# Patient Record
Sex: Male | Born: 1950
Health system: Southern US, Community
[De-identification: ages and names within clinical notes are randomized; demographics above are authoritative.]

## PROBLEM LIST (undated history)

## (undated) DIAGNOSIS — I350 Nonrheumatic aortic (valve) stenosis: Secondary | ICD-10-CM

## (undated) DIAGNOSIS — G8929 Other chronic pain: Secondary | ICD-10-CM

## (undated) DIAGNOSIS — M549 Dorsalgia, unspecified: Secondary | ICD-10-CM

## (undated) DIAGNOSIS — G9009 Other idiopathic peripheral autonomic neuropathy: Secondary | ICD-10-CM

## (undated) DIAGNOSIS — D689 Coagulation defect, unspecified: Secondary | ICD-10-CM

## (undated) DIAGNOSIS — I480 Paroxysmal atrial fibrillation: Secondary | ICD-10-CM

## (undated) DIAGNOSIS — Z8639 Personal history of other endocrine, nutritional and metabolic disease: Secondary | ICD-10-CM

## (undated) DIAGNOSIS — S0291XA Unspecified fracture of skull, initial encounter for closed fracture: Secondary | ICD-10-CM

## (undated) DIAGNOSIS — I639 Cerebral infarction, unspecified: Secondary | ICD-10-CM

## (undated) DIAGNOSIS — N200 Calculus of kidney: Secondary | ICD-10-CM

## (undated) DIAGNOSIS — Z8673 Personal history of transient ischemic attack (TIA), and cerebral infarction without residual deficits: Secondary | ICD-10-CM

## (undated) DIAGNOSIS — M1990 Unspecified osteoarthritis, unspecified site: Secondary | ICD-10-CM

## (undated) DIAGNOSIS — K219 Gastro-esophageal reflux disease without esophagitis: Secondary | ICD-10-CM

## (undated) DIAGNOSIS — F4310 Post-traumatic stress disorder, unspecified: Secondary | ICD-10-CM

## (undated) DIAGNOSIS — D649 Anemia, unspecified: Secondary | ICD-10-CM

## (undated) DIAGNOSIS — F32A Depression, unspecified: Secondary | ICD-10-CM

## (undated) DIAGNOSIS — R569 Unspecified convulsions: Secondary | ICD-10-CM

## (undated) DIAGNOSIS — Z9889 Other specified postprocedural states: Secondary | ICD-10-CM

## (undated) DIAGNOSIS — Z86718 Personal history of other venous thrombosis and embolism: Secondary | ICD-10-CM

## (undated) DIAGNOSIS — E785 Hyperlipidemia, unspecified: Secondary | ICD-10-CM

## (undated) DIAGNOSIS — F0390 Unspecified dementia without behavioral disturbance: Secondary | ICD-10-CM

## (undated) DIAGNOSIS — I10 Essential (primary) hypertension: Secondary | ICD-10-CM

## (undated) DIAGNOSIS — K56609 Unspecified intestinal obstruction, unspecified as to partial versus complete obstruction: Secondary | ICD-10-CM

## (undated) DIAGNOSIS — I6529 Occlusion and stenosis of unspecified carotid artery: Secondary | ICD-10-CM

## (undated) DIAGNOSIS — J449 Chronic obstructive pulmonary disease, unspecified: Secondary | ICD-10-CM

## (undated) DIAGNOSIS — R55 Syncope and collapse: Secondary | ICD-10-CM

## (undated) DIAGNOSIS — K635 Polyp of colon: Secondary | ICD-10-CM

## (undated) DIAGNOSIS — R600 Localized edema: Secondary | ICD-10-CM

## (undated) DIAGNOSIS — Z8249 Family history of ischemic heart disease and other diseases of the circulatory system: Secondary | ICD-10-CM

## (undated) DIAGNOSIS — F329 Major depressive disorder, single episode, unspecified: Secondary | ICD-10-CM

## (undated) DIAGNOSIS — R079 Chest pain, unspecified: Secondary | ICD-10-CM

## (undated) DIAGNOSIS — R112 Nausea with vomiting, unspecified: Secondary | ICD-10-CM

## (undated) HISTORY — DX: Chronic obstructive pulmonary disease, unspecified: J44.9

## (undated) HISTORY — DX: Cerebral infarction, unspecified: I63.9

## (undated) HISTORY — DX: Gastro-esophageal reflux disease without esophagitis: K21.9

## (undated) HISTORY — DX: Depression, unspecified: F32.A

## (undated) HISTORY — DX: Major depressive disorder, single episode, unspecified: F32.9

## (undated) HISTORY — DX: Unspecified convulsions: R56.9

## (undated) HISTORY — DX: Essential (primary) hypertension: I10

## (undated) HISTORY — PX: WRIST FRACTURE SURGERY: SHX121

## (undated) HISTORY — DX: Unspecified intestinal obstruction, unspecified as to partial versus complete obstruction: K56.609

## (undated) HISTORY — DX: Other idiopathic peripheral autonomic neuropathy: G90.09

## (undated) HISTORY — DX: Coagulation defect, unspecified: D68.9

## (undated) HISTORY — DX: Hyperlipidemia, unspecified: E78.5

## (undated) HISTORY — DX: Chest pain, unspecified: R07.9

## (undated) HISTORY — DX: Anemia, unspecified: D64.9

## (undated) HISTORY — DX: Personal history of other venous thrombosis and embolism: Z86.718

## (undated) HISTORY — DX: Personal history of transient ischemic attack (TIA), and cerebral infarction without residual deficits: Z86.73

## (undated) HISTORY — DX: Localized edema: R60.0

## (undated) HISTORY — DX: Nonrheumatic aortic (valve) stenosis: I35.0

## (undated) HISTORY — PX: JOINT REPLACEMENT: SHX530

## (undated) HISTORY — DX: Occlusion and stenosis of unspecified carotid artery: I65.29

## (undated) HISTORY — DX: Syncope and collapse: R55

## (undated) HISTORY — DX: Unspecified osteoarthritis, unspecified site: M19.90

## (undated) HISTORY — DX: Polyp of colon: K63.5

## (undated) HISTORY — PX: COLONOSCOPY: SHX174

## (undated) HISTORY — DX: Unspecified dementia, unspecified severity, without behavioral disturbance, psychotic disturbance, mood disturbance, and anxiety: F03.90

## (undated) HISTORY — DX: Personal history of other endocrine, nutritional and metabolic disease: Z86.39

## (undated) HISTORY — DX: Post-traumatic stress disorder, unspecified: F43.10

---
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
## 1997-11-12 ENCOUNTER — Inpatient Hospital Stay (HOSPITAL_COMMUNITY): Admission: AD | Admit: 1997-11-12 | Discharge: 1997-11-22 | Payer: Self-pay | Admitting: Surgery

## 1997-11-12 ENCOUNTER — Emergency Department (HOSPITAL_COMMUNITY): Admission: EM | Admit: 1997-11-12 | Discharge: 1997-11-12 | Payer: Self-pay | Admitting: Emergency Medicine

## 1997-12-13 ENCOUNTER — Ambulatory Visit (HOSPITAL_COMMUNITY): Admission: RE | Admit: 1997-12-13 | Discharge: 1997-12-13 | Payer: Self-pay | Admitting: General Surgery

## 1997-12-27 ENCOUNTER — Ambulatory Visit (HOSPITAL_COMMUNITY): Admission: RE | Admit: 1997-12-27 | Discharge: 1997-12-27 | Payer: Self-pay | Admitting: General Surgery

## 1998-11-21 ENCOUNTER — Emergency Department (HOSPITAL_COMMUNITY): Admission: EM | Admit: 1998-11-21 | Discharge: 1998-11-21 | Payer: Self-pay | Admitting: Otolaryngology

## 1998-12-27 ENCOUNTER — Other Ambulatory Visit: Admission: RE | Admit: 1998-12-27 | Discharge: 1998-12-27 | Payer: Self-pay | Admitting: Orthopedic Surgery

## 1999-07-17 ENCOUNTER — Ambulatory Visit (HOSPITAL_COMMUNITY): Admission: RE | Admit: 1999-07-17 | Discharge: 1999-07-17 | Payer: Self-pay | Admitting: *Deleted

## 1999-10-09 ENCOUNTER — Encounter: Payer: Self-pay | Admitting: Surgery

## 1999-10-09 ENCOUNTER — Ambulatory Visit (HOSPITAL_COMMUNITY): Admission: RE | Admit: 1999-10-09 | Discharge: 1999-10-09 | Payer: Self-pay | Admitting: Surgery

## 1999-10-31 HISTORY — PX: OTHER SURGICAL HISTORY: SHX169

## 1999-11-27 ENCOUNTER — Inpatient Hospital Stay (HOSPITAL_COMMUNITY): Admission: EM | Admit: 1999-11-27 | Discharge: 1999-12-17 | Payer: Self-pay | Admitting: Emergency Medicine

## 1999-11-27 ENCOUNTER — Encounter: Payer: Self-pay | Admitting: Surgery

## 1999-11-28 ENCOUNTER — Encounter: Payer: Self-pay | Admitting: Surgery

## 1999-11-29 ENCOUNTER — Encounter: Payer: Self-pay | Admitting: Surgery

## 1999-12-08 ENCOUNTER — Encounter: Payer: Self-pay | Admitting: Surgery

## 1999-12-11 ENCOUNTER — Encounter: Payer: Self-pay | Admitting: General Surgery

## 2000-01-16 ENCOUNTER — Encounter: Admission: RE | Admit: 2000-01-16 | Discharge: 2000-01-16 | Payer: Self-pay | Admitting: Surgery

## 2000-01-16 ENCOUNTER — Encounter: Payer: Self-pay | Admitting: Surgery

## 2000-08-30 ENCOUNTER — Encounter: Admission: RE | Admit: 2000-08-30 | Discharge: 2000-08-30 | Payer: Self-pay | Admitting: Family Medicine

## 2000-08-30 ENCOUNTER — Encounter: Payer: Self-pay | Admitting: Family Medicine

## 2000-09-13 ENCOUNTER — Inpatient Hospital Stay (HOSPITAL_COMMUNITY): Admission: EM | Admit: 2000-09-13 | Discharge: 2000-09-15 | Payer: Self-pay | Admitting: Emergency Medicine

## 2000-12-09 ENCOUNTER — Ambulatory Visit (HOSPITAL_BASED_OUTPATIENT_CLINIC_OR_DEPARTMENT_OTHER): Admission: RE | Admit: 2000-12-09 | Discharge: 2000-12-09 | Payer: Self-pay | Admitting: Orthopedic Surgery

## 2001-01-19 ENCOUNTER — Encounter: Payer: Self-pay | Admitting: Family Medicine

## 2001-01-19 ENCOUNTER — Encounter: Admission: RE | Admit: 2001-01-19 | Discharge: 2001-01-19 | Payer: Self-pay | Admitting: Family Medicine

## 2001-10-07 ENCOUNTER — Encounter: Payer: Self-pay | Admitting: Neurosurgery

## 2001-10-10 ENCOUNTER — Ambulatory Visit (HOSPITAL_COMMUNITY): Admission: RE | Admit: 2001-10-10 | Discharge: 2001-10-10 | Payer: Self-pay | Admitting: Neurosurgery

## 2001-10-10 ENCOUNTER — Encounter: Payer: Self-pay | Admitting: Neurosurgery

## 2001-11-12 ENCOUNTER — Emergency Department (HOSPITAL_COMMUNITY): Admission: EM | Admit: 2001-11-12 | Discharge: 2001-11-12 | Payer: Self-pay

## 2001-11-30 ENCOUNTER — Ambulatory Visit (HOSPITAL_COMMUNITY): Admission: RE | Admit: 2001-11-30 | Discharge: 2001-11-30 | Payer: Self-pay | Admitting: Neurosurgery

## 2001-11-30 ENCOUNTER — Encounter: Payer: Self-pay | Admitting: Neurosurgery

## 2002-02-03 ENCOUNTER — Observation Stay (HOSPITAL_COMMUNITY): Admission: EM | Admit: 2002-02-03 | Discharge: 2002-02-04 | Payer: Self-pay | Admitting: *Deleted

## 2002-02-03 ENCOUNTER — Encounter: Payer: Self-pay | Admitting: Neurology

## 2002-11-29 ENCOUNTER — Encounter: Payer: Self-pay | Admitting: Orthopedic Surgery

## 2002-11-29 ENCOUNTER — Inpatient Hospital Stay (HOSPITAL_COMMUNITY): Admission: RE | Admit: 2002-11-29 | Discharge: 2002-12-03 | Payer: Self-pay | Admitting: Orthopedic Surgery

## 2003-02-04 ENCOUNTER — Encounter: Payer: Self-pay | Admitting: Emergency Medicine

## 2003-02-04 ENCOUNTER — Emergency Department (HOSPITAL_COMMUNITY): Admission: AD | Admit: 2003-02-04 | Discharge: 2003-02-04 | Payer: Self-pay | Admitting: Emergency Medicine

## 2003-03-01 ENCOUNTER — Ambulatory Visit (HOSPITAL_COMMUNITY): Admission: RE | Admit: 2003-03-01 | Discharge: 2003-03-01 | Payer: Self-pay | Admitting: *Deleted

## 2003-04-05 ENCOUNTER — Ambulatory Visit (HOSPITAL_COMMUNITY): Admission: RE | Admit: 2003-04-05 | Discharge: 2003-04-05 | Payer: Self-pay | Admitting: *Deleted

## 2003-04-05 ENCOUNTER — Encounter: Payer: Self-pay | Admitting: Gastroenterology

## 2003-09-13 ENCOUNTER — Ambulatory Visit (HOSPITAL_COMMUNITY): Admission: RE | Admit: 2003-09-13 | Discharge: 2003-09-13 | Payer: Self-pay | Admitting: Orthopedic Surgery

## 2003-09-13 ENCOUNTER — Ambulatory Visit (HOSPITAL_BASED_OUTPATIENT_CLINIC_OR_DEPARTMENT_OTHER): Admission: RE | Admit: 2003-09-13 | Discharge: 2003-09-13 | Payer: Self-pay | Admitting: Orthopedic Surgery

## 2003-11-14 ENCOUNTER — Encounter: Admission: RE | Admit: 2003-11-14 | Discharge: 2003-11-14 | Payer: Self-pay | Admitting: Orthopedic Surgery

## 2004-01-03 ENCOUNTER — Ambulatory Visit (HOSPITAL_COMMUNITY): Admission: RE | Admit: 2004-01-03 | Discharge: 2004-01-03 | Payer: Self-pay | Admitting: Orthopedic Surgery

## 2004-01-03 ENCOUNTER — Ambulatory Visit (HOSPITAL_BASED_OUTPATIENT_CLINIC_OR_DEPARTMENT_OTHER): Admission: RE | Admit: 2004-01-03 | Discharge: 2004-01-03 | Payer: Self-pay | Admitting: Orthopedic Surgery

## 2004-04-29 ENCOUNTER — Encounter: Admission: RE | Admit: 2004-04-29 | Discharge: 2004-04-29 | Payer: Self-pay | Admitting: Orthopedic Surgery

## 2004-05-02 ENCOUNTER — Ambulatory Visit: Payer: Self-pay | Admitting: Internal Medicine

## 2004-05-07 ENCOUNTER — Ambulatory Visit: Payer: Self-pay | Admitting: Internal Medicine

## 2004-05-12 ENCOUNTER — Ambulatory Visit: Payer: Self-pay | Admitting: Internal Medicine

## 2004-05-20 ENCOUNTER — Ambulatory Visit: Payer: Self-pay | Admitting: Internal Medicine

## 2004-05-27 ENCOUNTER — Ambulatory Visit (HOSPITAL_COMMUNITY): Admission: RE | Admit: 2004-05-27 | Discharge: 2004-05-27 | Payer: Self-pay | Admitting: Internal Medicine

## 2004-06-10 ENCOUNTER — Ambulatory Visit: Payer: Self-pay | Admitting: Gastroenterology

## 2004-06-12 ENCOUNTER — Ambulatory Visit: Payer: Self-pay | Admitting: Internal Medicine

## 2004-06-23 ENCOUNTER — Ambulatory Visit: Payer: Self-pay | Admitting: Internal Medicine

## 2004-06-24 ENCOUNTER — Ambulatory Visit (HOSPITAL_COMMUNITY): Admission: RE | Admit: 2004-06-24 | Discharge: 2004-06-24 | Payer: Self-pay | Admitting: Gastroenterology

## 2004-06-24 DIAGNOSIS — K263 Acute duodenal ulcer without hemorrhage or perforation: Secondary | ICD-10-CM | POA: Insufficient documentation

## 2004-07-01 ENCOUNTER — Ambulatory Visit: Payer: Self-pay | Admitting: Gastroenterology

## 2004-07-02 ENCOUNTER — Encounter (INDEPENDENT_AMBULATORY_CARE_PROVIDER_SITE_OTHER): Payer: Self-pay | Admitting: Specialist

## 2004-07-02 ENCOUNTER — Inpatient Hospital Stay (HOSPITAL_COMMUNITY): Admission: RE | Admit: 2004-07-02 | Discharge: 2004-07-05 | Payer: Self-pay | Admitting: Orthopedic Surgery

## 2004-07-21 ENCOUNTER — Encounter: Admission: RE | Admit: 2004-07-21 | Discharge: 2004-10-19 | Payer: Self-pay | Admitting: Orthopedic Surgery

## 2004-08-03 ENCOUNTER — Inpatient Hospital Stay (HOSPITAL_COMMUNITY): Admission: EM | Admit: 2004-08-03 | Discharge: 2004-08-19 | Payer: Self-pay | Admitting: Emergency Medicine

## 2004-08-03 ENCOUNTER — Ambulatory Visit: Payer: Self-pay | Admitting: Gastroenterology

## 2004-08-03 ENCOUNTER — Ambulatory Visit: Payer: Self-pay | Admitting: Internal Medicine

## 2004-08-07 ENCOUNTER — Encounter (INDEPENDENT_AMBULATORY_CARE_PROVIDER_SITE_OTHER): Payer: Self-pay | Admitting: *Deleted

## 2004-08-07 DIAGNOSIS — K559 Vascular disorder of intestine, unspecified: Secondary | ICD-10-CM | POA: Insufficient documentation

## 2004-08-26 ENCOUNTER — Ambulatory Visit: Payer: Self-pay | Admitting: Internal Medicine

## 2004-08-29 ENCOUNTER — Ambulatory Visit: Payer: Self-pay | Admitting: Gastroenterology

## 2004-09-02 ENCOUNTER — Ambulatory Visit: Payer: Self-pay | Admitting: Gastroenterology

## 2004-09-25 ENCOUNTER — Ambulatory Visit: Payer: Self-pay | Admitting: Gastroenterology

## 2004-12-08 ENCOUNTER — Ambulatory Visit: Payer: Self-pay | Admitting: Endocrinology

## 2004-12-15 ENCOUNTER — Ambulatory Visit: Payer: Self-pay | Admitting: Internal Medicine

## 2004-12-23 ENCOUNTER — Ambulatory Visit (HOSPITAL_COMMUNITY): Admission: RE | Admit: 2004-12-23 | Discharge: 2004-12-23 | Payer: Self-pay | Admitting: Internal Medicine

## 2005-04-29 ENCOUNTER — Ambulatory Visit: Payer: Self-pay | Admitting: Internal Medicine

## 2005-05-04 ENCOUNTER — Ambulatory Visit: Payer: Self-pay | Admitting: Internal Medicine

## 2005-05-07 ENCOUNTER — Ambulatory Visit: Payer: Self-pay | Admitting: Gastroenterology

## 2005-05-13 ENCOUNTER — Ambulatory Visit (HOSPITAL_COMMUNITY): Admission: RE | Admit: 2005-05-13 | Discharge: 2005-05-13 | Payer: Self-pay | Admitting: Gastroenterology

## 2005-05-13 ENCOUNTER — Ambulatory Visit: Payer: Self-pay | Admitting: Gastroenterology

## 2005-06-29 ENCOUNTER — Ambulatory Visit (HOSPITAL_COMMUNITY): Admission: RE | Admit: 2005-06-29 | Discharge: 2005-06-29 | Payer: Self-pay | Admitting: Orthopedic Surgery

## 2005-10-08 ENCOUNTER — Encounter: Admission: RE | Admit: 2005-10-08 | Discharge: 2005-10-08 | Payer: Self-pay | Admitting: Orthopedic Surgery

## 2006-01-26 ENCOUNTER — Ambulatory Visit: Payer: Self-pay | Admitting: Gastroenterology

## 2006-02-02 ENCOUNTER — Encounter: Payer: Self-pay | Admitting: Gastroenterology

## 2006-02-02 ENCOUNTER — Ambulatory Visit (HOSPITAL_COMMUNITY): Admission: RE | Admit: 2006-02-02 | Discharge: 2006-02-02 | Payer: Self-pay | Admitting: Gastroenterology

## 2006-02-02 DIAGNOSIS — K222 Esophageal obstruction: Secondary | ICD-10-CM | POA: Insufficient documentation

## 2006-02-26 ENCOUNTER — Ambulatory Visit: Payer: Self-pay | Admitting: Endocrinology

## 2006-07-28 ENCOUNTER — Ambulatory Visit (HOSPITAL_COMMUNITY): Admission: RE | Admit: 2006-07-28 | Discharge: 2006-07-29 | Payer: Self-pay | Admitting: Orthopedic Surgery

## 2006-09-17 ENCOUNTER — Ambulatory Visit: Payer: Self-pay | Admitting: Internal Medicine

## 2006-10-05 ENCOUNTER — Ambulatory Visit: Payer: Self-pay | Admitting: Internal Medicine

## 2006-10-12 ENCOUNTER — Ambulatory Visit: Payer: Self-pay | Admitting: Internal Medicine

## 2006-10-12 LAB — CONVERTED CEMR LAB
BUN: 8 mg/dL (ref 6–23)
Creatinine, Ser: 0.8 mg/dL (ref 0.4–1.5)

## 2006-10-20 ENCOUNTER — Ambulatory Visit (HOSPITAL_COMMUNITY): Admission: RE | Admit: 2006-10-20 | Discharge: 2006-10-20 | Payer: Self-pay | Admitting: Internal Medicine

## 2006-11-01 ENCOUNTER — Ambulatory Visit: Payer: Self-pay | Admitting: Internal Medicine

## 2006-11-09 ENCOUNTER — Ambulatory Visit: Payer: Self-pay | Admitting: Internal Medicine

## 2006-12-24 ENCOUNTER — Ambulatory Visit: Payer: Self-pay | Admitting: Internal Medicine

## 2006-12-24 ENCOUNTER — Inpatient Hospital Stay (HOSPITAL_COMMUNITY): Admission: EM | Admit: 2006-12-24 | Discharge: 2006-12-27 | Payer: Self-pay | Admitting: Emergency Medicine

## 2007-01-10 ENCOUNTER — Ambulatory Visit (HOSPITAL_COMMUNITY): Admission: RE | Admit: 2007-01-10 | Discharge: 2007-01-10 | Payer: Self-pay | Admitting: Internal Medicine

## 2007-01-10 ENCOUNTER — Encounter: Payer: Self-pay | Admitting: Internal Medicine

## 2007-01-10 ENCOUNTER — Ambulatory Visit: Payer: Self-pay | Admitting: Internal Medicine

## 2007-01-10 ENCOUNTER — Ambulatory Visit: Payer: Self-pay | Admitting: Vascular Surgery

## 2007-01-20 ENCOUNTER — Ambulatory Visit: Payer: Self-pay | Admitting: Internal Medicine

## 2007-01-21 DIAGNOSIS — I5032 Chronic diastolic (congestive) heart failure: Secondary | ICD-10-CM

## 2007-01-21 DIAGNOSIS — G9009 Other idiopathic peripheral autonomic neuropathy: Secondary | ICD-10-CM | POA: Insufficient documentation

## 2007-01-21 DIAGNOSIS — M1990 Unspecified osteoarthritis, unspecified site: Secondary | ICD-10-CM | POA: Insufficient documentation

## 2007-01-21 DIAGNOSIS — Z860100 Personal history of colon polyps, unspecified: Secondary | ICD-10-CM | POA: Insufficient documentation

## 2007-01-21 DIAGNOSIS — J309 Allergic rhinitis, unspecified: Secondary | ICD-10-CM | POA: Insufficient documentation

## 2007-01-21 DIAGNOSIS — K219 Gastro-esophageal reflux disease without esophagitis: Secondary | ICD-10-CM | POA: Insufficient documentation

## 2007-01-21 DIAGNOSIS — K5730 Diverticulosis of large intestine without perforation or abscess without bleeding: Secondary | ICD-10-CM | POA: Insufficient documentation

## 2007-01-21 DIAGNOSIS — I110 Hypertensive heart disease with heart failure: Secondary | ICD-10-CM | POA: Insufficient documentation

## 2007-01-21 DIAGNOSIS — E782 Mixed hyperlipidemia: Secondary | ICD-10-CM | POA: Insufficient documentation

## 2007-01-21 DIAGNOSIS — E1169 Type 2 diabetes mellitus with other specified complication: Secondary | ICD-10-CM | POA: Insufficient documentation

## 2007-01-21 DIAGNOSIS — E785 Hyperlipidemia, unspecified: Secondary | ICD-10-CM | POA: Insufficient documentation

## 2007-01-21 DIAGNOSIS — Z8673 Personal history of transient ischemic attack (TIA), and cerebral infarction without residual deficits: Secondary | ICD-10-CM | POA: Insufficient documentation

## 2007-02-02 ENCOUNTER — Ambulatory Visit: Payer: Self-pay | Admitting: Internal Medicine

## 2007-02-02 LAB — CONVERTED CEMR LAB
ALT: 21 units/L (ref 0–53)
AST: 22 units/L (ref 0–37)
Calcium: 9.4 mg/dL (ref 8.4–10.5)
Chloride: 108 meq/L (ref 96–112)
Cholesterol: 196 mg/dL (ref 0–200)
Eosinophils Absolute: 0.3 10*3/uL (ref 0.0–0.6)
Eosinophils Relative: 4 % (ref 0.0–5.0)
GFR calc Af Amer: 129 mL/min
HCT: 38.7 % — ABNORMAL LOW (ref 39.0–52.0)
Hemoglobin: 13.5 g/dL (ref 13.0–17.0)
Hgb A1c MFr Bld: 5.3 % (ref 4.6–6.0)
Ketones, ur: NEGATIVE mg/dL
Leukocytes, UA: NEGATIVE
MCV: 83.2 fL (ref 78.0–100.0)
Monocytes Absolute: 2.1 10*3/uL — ABNORMAL HIGH (ref 0.2–0.7)
Neutrophils Relative %: 29 % — ABNORMAL LOW (ref 43.0–77.0)
Nitrite: NEGATIVE
PSA: 0.35 ng/mL (ref 0.10–4.00)
Potassium: 3.8 meq/L (ref 3.5–5.1)
RBC: 4.65 M/uL (ref 4.22–5.81)
RDW: 12.9 % (ref 11.5–14.6)
Total CHOL/HDL Ratio: 7.2
Urobilinogen, UA: 0.2 (ref 0.0–1.0)
WBC: 6 10*3/uL (ref 4.5–10.5)

## 2007-02-10 ENCOUNTER — Ambulatory Visit: Payer: Self-pay | Admitting: Internal Medicine

## 2007-07-01 ENCOUNTER — Ambulatory Visit: Payer: Self-pay | Admitting: Gastroenterology

## 2007-07-22 ENCOUNTER — Ambulatory Visit: Payer: Self-pay | Admitting: Internal Medicine

## 2007-07-22 DIAGNOSIS — Z87442 Personal history of urinary calculi: Secondary | ICD-10-CM | POA: Insufficient documentation

## 2007-07-22 DIAGNOSIS — IMO0002 Reserved for concepts with insufficient information to code with codable children: Secondary | ICD-10-CM | POA: Insufficient documentation

## 2007-07-22 DIAGNOSIS — I739 Peripheral vascular disease, unspecified: Secondary | ICD-10-CM | POA: Insufficient documentation

## 2007-07-25 ENCOUNTER — Encounter: Payer: Self-pay | Admitting: Internal Medicine

## 2007-07-25 ENCOUNTER — Ambulatory Visit: Payer: Self-pay

## 2007-09-15 ENCOUNTER — Encounter: Payer: Self-pay | Admitting: Internal Medicine

## 2007-11-16 ENCOUNTER — Telehealth: Payer: Self-pay | Admitting: Gastroenterology

## 2007-11-17 ENCOUNTER — Telehealth: Payer: Self-pay | Admitting: Gastroenterology

## 2007-12-14 ENCOUNTER — Ambulatory Visit: Payer: Self-pay | Admitting: Gastroenterology

## 2007-12-14 DIAGNOSIS — R1032 Left lower quadrant pain: Secondary | ICD-10-CM | POA: Insufficient documentation

## 2007-12-15 ENCOUNTER — Encounter: Payer: Self-pay | Admitting: Gastroenterology

## 2007-12-15 ENCOUNTER — Encounter: Payer: Self-pay | Admitting: Internal Medicine

## 2008-01-16 ENCOUNTER — Encounter (INDEPENDENT_AMBULATORY_CARE_PROVIDER_SITE_OTHER): Payer: Self-pay | Admitting: *Deleted

## 2008-01-16 ENCOUNTER — Ambulatory Visit: Payer: Self-pay | Admitting: Internal Medicine

## 2008-01-16 DIAGNOSIS — G478 Other sleep disorders: Secondary | ICD-10-CM | POA: Insufficient documentation

## 2008-01-16 DIAGNOSIS — M1710 Unilateral primary osteoarthritis, unspecified knee: Secondary | ICD-10-CM | POA: Insufficient documentation

## 2008-01-16 DIAGNOSIS — J45909 Unspecified asthma, uncomplicated: Secondary | ICD-10-CM | POA: Insufficient documentation

## 2008-01-16 DIAGNOSIS — E118 Type 2 diabetes mellitus with unspecified complications: Secondary | ICD-10-CM | POA: Insufficient documentation

## 2008-05-18 ENCOUNTER — Encounter: Admission: RE | Admit: 2008-05-18 | Discharge: 2008-05-18 | Payer: Self-pay | Admitting: Anesthesiology

## 2008-06-18 ENCOUNTER — Telehealth: Payer: Self-pay | Admitting: Internal Medicine

## 2008-07-30 ENCOUNTER — Telehealth: Payer: Self-pay | Admitting: Gastroenterology

## 2008-07-31 ENCOUNTER — Ambulatory Visit: Payer: Self-pay | Admitting: Internal Medicine

## 2008-07-31 ENCOUNTER — Ambulatory Visit: Payer: Self-pay | Admitting: Gastroenterology

## 2008-07-31 DIAGNOSIS — K5900 Constipation, unspecified: Secondary | ICD-10-CM | POA: Insufficient documentation

## 2008-08-29 ENCOUNTER — Ambulatory Visit: Payer: Self-pay | Admitting: Gastroenterology

## 2008-12-27 ENCOUNTER — Ambulatory Visit: Payer: Self-pay | Admitting: *Deleted

## 2009-01-09 ENCOUNTER — Encounter: Admission: RE | Admit: 2009-01-09 | Discharge: 2009-01-09 | Payer: Self-pay | Admitting: *Deleted

## 2009-01-09 ENCOUNTER — Encounter: Payer: Self-pay | Admitting: Internal Medicine

## 2009-01-09 ENCOUNTER — Ambulatory Visit: Payer: Self-pay | Admitting: Thoracic Surgery

## 2009-01-22 ENCOUNTER — Ambulatory Visit: Payer: Self-pay | Admitting: Internal Medicine

## 2009-01-22 DIAGNOSIS — R911 Solitary pulmonary nodule: Secondary | ICD-10-CM | POA: Insufficient documentation

## 2009-04-10 ENCOUNTER — Ambulatory Visit (HOSPITAL_COMMUNITY): Admission: RE | Admit: 2009-04-10 | Discharge: 2009-04-10 | Payer: Self-pay | Admitting: Thoracic Surgery

## 2009-04-10 ENCOUNTER — Encounter: Payer: Self-pay | Admitting: Thoracic Surgery

## 2009-04-10 ENCOUNTER — Ambulatory Visit: Payer: Self-pay | Admitting: Thoracic Surgery

## 2009-04-16 ENCOUNTER — Ambulatory Visit: Payer: Self-pay | Admitting: Thoracic Surgery

## 2009-04-17 ENCOUNTER — Telehealth: Payer: Self-pay | Admitting: Internal Medicine

## 2009-07-29 ENCOUNTER — Emergency Department (HOSPITAL_COMMUNITY): Admission: EM | Admit: 2009-07-29 | Discharge: 2009-07-29 | Payer: Self-pay | Admitting: Emergency Medicine

## 2009-08-15 ENCOUNTER — Telehealth (INDEPENDENT_AMBULATORY_CARE_PROVIDER_SITE_OTHER): Payer: Self-pay | Admitting: *Deleted

## 2009-08-19 ENCOUNTER — Ambulatory Visit: Payer: Self-pay | Admitting: Internal Medicine

## 2009-08-19 ENCOUNTER — Encounter (INDEPENDENT_AMBULATORY_CARE_PROVIDER_SITE_OTHER): Payer: Self-pay | Admitting: Family Medicine

## 2009-08-19 ENCOUNTER — Ambulatory Visit (HOSPITAL_COMMUNITY): Admission: RE | Admit: 2009-08-19 | Discharge: 2009-08-19 | Payer: Self-pay | Admitting: Family Medicine

## 2009-08-19 ENCOUNTER — Ambulatory Visit: Payer: Self-pay

## 2009-08-20 ENCOUNTER — Encounter: Payer: Self-pay | Admitting: Internal Medicine

## 2009-09-17 ENCOUNTER — Ambulatory Visit (HOSPITAL_COMMUNITY): Admission: RE | Admit: 2009-09-17 | Discharge: 2009-09-17 | Payer: Self-pay | Admitting: Cardiology

## 2010-01-09 ENCOUNTER — Ambulatory Visit: Payer: Self-pay | Admitting: Gastroenterology

## 2010-03-01 LAB — HM COLONOSCOPY

## 2010-03-06 ENCOUNTER — Ambulatory Visit (HOSPITAL_COMMUNITY): Admission: RE | Admit: 2010-03-06 | Discharge: 2010-03-06 | Payer: Self-pay | Admitting: Gastroenterology

## 2010-03-06 ENCOUNTER — Ambulatory Visit: Payer: Self-pay | Admitting: Gastroenterology

## 2010-03-06 ENCOUNTER — Telehealth: Payer: Self-pay | Admitting: Gastroenterology

## 2010-06-21 ENCOUNTER — Encounter: Payer: Self-pay | Admitting: Physician Assistant

## 2010-07-01 NOTE — Letter (Signed)
Summary: [facility] Instructions  [facility]  [address] [place], [place] [address]   Phone: [phone]  Fax: [phone]       [name]    12/28/1950    MRN: [id number]        Procedure Day /Date: Thursday October 6th, 2011     Arrival Time: 11:30am     Procedure Time: 12:30pm     Location of Procedure:                     _x _  [facility] ( Outpatient Registration)                        PREPARATION FOR COLONOSCOPY WITH MOVIPREP   Starting 5 days prior to your procedure 03/01/10 do not eat nuts, seeds, popcorn, corn, beans, peas,  salads, or any raw vegetables.  Do not take any fiber supplements (e.g. Metamucil, Citrucel, and Benefiber).  THE DAY BEFORE YOUR PROCEDURE         DATE: 03/05/10  DAY: Wednesday  1.  Drink clear liquids the entire day-NO SOLID FOOD  2.  Do not drink anything colored red or purple.  Avoid juices with pulp.  No orange juice.  3.  Drink at least 64 oz. (8 glasses) of fluid/clear liquids during the day to prevent dehydration and help the prep work efficiently.  CLEAR LIQUIDS INCLUDE: Water Jello Ice Popsicles Tea (sugar ok, no milk/cream) Powdered fruit flavored drinks Coffee (sugar ok, no milk/cream) Gatorade Juice: apple, white grape, white cranberry  Lemonade Clear bullion, consomm, broth Carbonated beverages (any kind) Strained chicken noodle soup Hard Candy                             4.  In the morning, mix first dose of MoviPrep solution:    Empty 1 Pouch A and 1 Pouch B into the disposable container    Add lukewarm drinking water to the top line of the container. Mix to dissolve    Refrigerate (mixed solution should be used within 24 hrs)  5.  Begin drinking the prep at 5:00 p.m. The MoviPrep container is divided by 4 marks.   Every 15 minutes drink the solution down to the next mark (approximately 8 oz) until the full liter is complete.   6.  Follow completed prep with 16 oz of clear  liquid of your choice (Nothing red or purple).  Continue to drink clear liquids until bedtime.  7.  Before going to bed, mix second dose of MoviPrep solution:    Empty 1 Pouch A and 1 Pouch B into the disposable container    Add lukewarm drinking water to the top line of the container. Mix to dissolve    Refrigerate  THE DAY OF YOUR PROCEDURE      DATE:03/06/10 MWU:[id number]  Beginning at 7:30 a.m. (5 hours before procedure):         1. Every 15 minutes, drink the solution down to the next mark (approx 8 oz) until the full liter is complete.  2. Follow completed prep with 16 oz. of clear liquid of your choice.    3. You may drink clear liquids until 8:30am (4 HOURS BEFORE PROCEDURE).   MEDICATION INSTRUCTIONS  Unless otherwise instructed, you should take regular prescription medications with a small sip of water   as early as possible the morning of your  procedure.          OTHER INSTRUCTIONS  You will need a responsible adult at least 60 years of age to accompany you and drive you home.   This person must remain in the waiting room during your procedure.  Wear loose fitting clothing that is easily removed.  Leave jewelry and other valuables at home.  However, you may wish to bring a book to read or  an iPod/MP3 player to listen to music as you wait for your procedure to start.  Remove all body piercing jewelry and leave at home.  Total time from sign-in until discharge is approximately 2-3 hours.  You should go home directly after your procedure and rest.  You can resume normal activities the  day after your procedure.  The day of your procedure you should not:   Drive   Make legal decisions   Operate machinery   Drink alcohol   Return to work  You will receive specific instructions about eating, activities and medications before you leave.    The above instructions have been reviewed and explained to me by   _______________________    I fully  understand and can verbalize these instructions _____________________________ Date _________

## 2010-07-01 NOTE — Assessment & Plan Note (Signed)
Summary: f/u--ch.                                                                                                                                                                                                History of Present Illness Visit Type: Follow-up Visit Primary GI MD: Melvia Heaps MD [facility] Primary Provider: Dara Hoyer MD Requesting Provider: na Chief Complaint: F/u for abd pain, constipation, and abd distention. Pt denies any GI complaints  History of Present Illness:   Mr. Mesta has returned for followup of his constipation and colon polyps.  His history of colon polyps diagnosed in 2001.  Last colonoscopy 2006 was negative except for diverticulosis.  Approximately a month ago he had what sounds like af small bowel obstruction characterized by abdominal distention and recurrent nausea,  vomiting including vomiting of fecal material.  This resolved spontaneously.  He suffers from chronic constipation.  He takes morphine daily for chronic back pain.   GI Review of Systems      Denies abdominal pain, acid reflux, belching, bloating, chest pain, dysphagia with liquids, dysphagia with solids, heartburn, loss of appetite, nausea, vomiting, vomiting blood, weight loss, and  weight gain.        Denies anal fissure, black tarry stools, change in bowel habit, constipation, diarrhea, diverticulosis, fecal incontinence, heme positive stool, hemorrhoids, irritable bowel syndrome, jaundice, light color stool, liver problems, rectal bleeding, and  rectal pain.    Current Medications (verified): 1)  Proair Hfa 108 (90 Base) Mcg/act Aers (Albuterol Sulfate) .... Inhale 2 Puff Using Inhaler Four Times A Day 2)  Lisinopril 40 Mg  Tabs (Lisinopril) .... 1/2 Two Times A Day 3)  Tricor 145 Mg  Tabs (Fenofibrate) .[name].. 1 By Mouth Qd 4)  Adult Aspirin Ec Low Strength 81 Mg  Tbec (Aspirin) .[name].. 1 By Mouth Once Daily 5)  Morphine Sulfate 30 Mg  Tabs (Morphine Sulfate) .[name].. 1 By Mouth Q 4-6 Hrs. 6)   Promethazine Hcl 25 Mg  Tabs (Promethazine Hcl) .[name].. 1 By Mouth Qid Prn 7)  Hyomax-Sl 0.125 Mg  Subl (Hyoscyamine Sulfate) .... 2 Tabs Sublingual Q.4 H. P.r.n. 8)  Triamterene-Hctz 50-25 Mg  Caps (Triamterene-Hctz) .... Take 1 Tablet By Mouth Once A Day 9)  Vitamin D 1000 Unit  Tabs (Cholecalciferol) .... 2 By Mouth Once Daily 10)  Nortriptyline Hcl 25 Mg Caps (Nortriptyline Hcl) .... Take 2 Cap At Bedtime 11)  Nexium 40 Mg Cpdr (Esomeprazole Magnesium) .[name].. 1 At Bedtime 12)  Tessalon Perles 100 Mg Caps (Benzonatate) .[name].. 1 Three Times A Day As Needed 13)  Alprazolam 1 Mg Tabs (Alprazolam) .Marland KitchenMarland KitchenMarland Kitchen  One Tablet By Mouth Once Daily  Allergies (verified): 1)  ! Codeine 2)  ! Talwin 3)  ! * Stadol 4)  ! Aspirin 5)  ! * Oxycontin 6)  ! Doxycycline 7)  ! * Omnipaque 8)  ! Lipitor 9)  ! * Latex  Past History:  Past Medical History: Reviewed history from 01/22/2009 and no changes required. Colonic polyps, hx of Hyperlipidemia Hypertension     -  Stop ACE January 22, 2009  Cerebrovascular accident, hx of Allergic rhinitis Diverticulosis, colon Osteoarthritis Peripheral Neuropathy - painful glucose intolerance recurrent SBO hx of ischemic colitis - 6/06 GERD    complicated by esophageal stricture....................................................................Marland KitchenArlyce Dice    - s/p egd 02/02/2006  Nephrolithiasis, hx of hx of c-spine disc disease Diabetes mellitus, type II - diet low vit D Peripheral neuropathy COUGH.............................................................................................Marland KitchenWert    - Spirometry nl  11/14/08  Past Surgical History: Reviewed history from 07/22/2007 and no changes required. Appendectomy Cholecystectomy Gastrectomy - partial with Bilroth I anastamosis Total knee replacement - right Tonsillectomy Surgery to L Breast- 1993, 1995 Shrapnel Wounds to the abd-1971 Shrapnel to face, arms and legs- 1972, 1973 left wrist surgury s/p  sphincterotomy for recurrent pancreatitis s/p shoulder surgury - left rotater cuff  Family History: parents and 2 sibs with DM father with MI at 32 yo Asthma- Mother Stroke- Mother No FH of Colon Cancer:  Social History: Never Smoked retired Emergency planning/management officer Married Alcohol use-no 3 children: Son passed last year in July in [place])  Review of Systems       The patient complains of allergy/sinus, arthritis/joint pain, back pain, depression-new, fatigue, muscle pains/cramps, sleeping problems, and swelling of feet/legs.  The patient denies anemia, anxiety-new, blood in urine, breast changes/lumps, change in vision, confusion, cough, coughing up blood, fainting, fever, headaches-new, hearing problems, heart murmur, heart rhythm changes, itching, night sweats, nosebleeds, shortness of breath, skin rash, sore throat, swollen lymph glands, thirst - excessive, urination - excessive, urination changes/pain, urine leakage, vision changes, and voice change.         All other systems were reviewed and were negative   Vital Signs:  Patient profile:   60 year old male Height:      70 inches Weight:      231 pounds BMI:     33.26 BSA:     2.22 Pulse rate:   76 / minute Pulse rhythm:   regular BP sitting:   148 / 88  (left arm) Cuff size:   regular  Vitals Entered By: Ok Anis CMA (January 09, 2010 2:25 PM)  Physical Exam  Additional Exam:  Physical Exam He Is a Well-Developed Well-Nourished Male  skin: anicteric HEENT: normocephalic; PEERLA; no nasal or pharyngeal abnormalities neck: supple nodes: no cervical lymphadenopathy chest: clear to ausculatation and percussion heart: no murmurs, gallops, or rubs abd: soft, nontender; BS normoactive; no abdominal masses, tenderness, organomegaly rectal: deferred ext: no cynanosis, clubbing, edema skeletal: no deformities neuro: oriented x 3; no focal abnormalities    Impression & Recommendations:  Problem # 1:  COLONIC POLYPS,  HX OF (ICD-V12.72)  Plan followup colonoscopy.  This will be done with propofol  Orders: Colonoscopy (Colon)  Problem # 2:  DIABETES MELLITUS, TYPE II (ICD-250.00) Assessment: Comment Only  Problem # 3:  Hx of ISCHEMIC COLITIS (ICD-557.9) Assessment: Comment Only  Problem # 4:  CEREBROVASCULAR ACCIDENT, HX OF (ICD-V12.50) Assessment: Comment Only  Patient Instructions: 1)  CC Dr. Benedetto Goad 2)  Colonoscopy and Flexible Sigmoidoscopy brochure given.  3)  The medication list was reviewed and reconciled.  All changed / newly prescribed medications were explained.  A complete medication list was provided to the patient / caregiver. Prescriptions: MOVIPREP 100 GM  SOLR (PEG-KCL-NACL-NASULF-NA ASC-C) As per prep instructions.  #1 x 0   Entered by:   Christie Nottingham CMA (AAMA)   Authorized by:   Louis Meckel MD   Signed by:   Louis Meckel MD on 01/09/2010   Method used:   Electronically to        [facility]  [address] [phone]* (retail)       [address]       [place], [place]  [address]       Ph: [phone]       Fax: [phone]   RxID:   [id number]

## 2010-07-01 NOTE — Procedures (Signed)
Summary: Colonoscopy  Patient: [name] Note: All result statuses are Final unless otherwise noted.  Tests: (1) Colonoscopy (COL)   COL Colonoscopy           DONE     [facility]     [address] [place], [place]  [address]           COLONOSCOPY PROCEDURE REPORT           PATIENT:  [name], [name]  MR#:  [id number]     BIRTHDATE:  1951-05-16, 59 yrs. old  GENDER:  male     ENDOSCOPIST:  Barbette Hair. Arlyce Dice, MD     REF. BY:     PROCEDURE DATE:  03/06/2010     PROCEDURE:  Diagnostic Colonoscopy     ASA CLASS:  Class II     INDICATIONS:  screening, history of pre-cancerous (adenomatous)     colon polyps Index polypectomy 2001.     Colo 2006 negative for polyps     MEDICATIONS:   MAC sedation, administered by CRNA           DESCRIPTION OF PROCEDURE:   After the risks benefits and     alternatives of the procedure were thoroughly explained, informed     consent was obtained.  Digital rectal exam was performed and     revealed no abnormalities.   The EC-[id number] ([id number]) endoscope was     introduced through the anus and advanced to the cecum, which was     identified by the ileocecal valve, limited by poor preparation.     Significant amount of retained liquid stool  The quality of the     prep was Miralax fair.  The instrument was then slowly withdrawn     as the colon was fully examined.     <<PROCEDUREIMAGES>>           FINDINGS:  normal cecum (see image001, image003, image006, and     image007).   Retroflexed views in the rectum revealed no     abnormalities.    The scope was then withdrawn from the patient     and the procedure completed.           COMPLICATIONS:  None     ENDOSCOPIC IMPRESSION:     1) Normal cecum 9limited exam)     RECOMMENDATIONS:     1) Colonoscopy 7 years     REPEAT EXAM:  In 7 year(s) for Colonoscopy.           ______________________________     Barbette Hair. Arlyce Dice, MD           CC:  Dara Hoyer MD           n.     Rosalie Doctor:    Barbette Hair. Sabel Hornbeck at 03/06/2010 01:31 PM           Sherolyn Buba, [id number]  Note: An exclamation mark (!) indicates a result that was not dispersed into the flowsheet. Document Creation Date: 03/06/2010 1:31 PM _______________________________________________________________________  (1) Order result status: Final Collection or observation date-time: 03/06/2010 13:28 Requested date-time:  Receipt date-time:  Reported date-time:  Referring Physician:   Ordering Physician: Melvia Heaps [phone]) Specimen Source:  Source: Launa Grill Order Number: [id number] Lab site:   Appended Document: Colonoscopy    Clinical Lists Changes  Observations: Added new observation of COLONNXTDUE: 03/2017 (03/06/2010 13:54)

## 2010-07-01 NOTE — Progress Notes (Signed)
   Spokw w/ Pt this Morning he was asking for [place] to get his Records from the [facility], I faxed a ROI over to [phone]. Will wait on Records call back [phone] [facility]  August 15, 2009 11:57 AM    Appended Document:  Recieved Records from [facility] forwarded to [facility] for Appt needing to b made...KM

## 2010-07-01 NOTE — Progress Notes (Signed)
Summary: procedure ?'s   Phone Note Call from Patient Call back at Home Phone [phone]   Caller: Patient Call For: Dr. Arlyce Dice Reason for Call: Talk to Nurse Summary of Call: having a procedure today at [facility]... has questions about the procedure Initial call taken by: Vallarie Mare,  March 06, 2010 8:38 AM  Follow-up for Phone Call        Dr Arlyce Dice patient after both doses of Moviprep he still has muddy colored stools this am.  Pain last night and some distention but OK now.  He is scheduled for colon this am at 12:30  Follow-up by: Darcey Nora RN, CGRN,  March 06, 2010 8:57 AM  Additional Follow-up for Phone Call Additional follow up Details #1::        Reviewed with Dr Arlyce Dice patient needs to take a bottle of MagCitrate.  Patient is advised to call back if he is not clear after the MagCitrate. Additional Follow-up by: Darcey Nora RN, CGRN,  March 06, 2010 9:14 AM

## 2010-07-01 NOTE — Procedures (Signed)
Summary: Instructions for Procedure/[place]  Instructions for Procedure/[place]   Imported By: Sherian Rein 01/13/2010 12:41:43  _____________________________________________________________________  External Attachment:    Type:   Image     Comment:   External Document

## 2010-07-22 ENCOUNTER — Telehealth: Payer: Self-pay | Admitting: Gastroenterology

## 2010-07-29 NOTE — Progress Notes (Signed)
Summary: Questions   Phone Note Call from Patient Call back at Home Phone [phone]   Caller: Patient Call For: Dr. Arlyce Dice Reason for Call: Talk to Nurse Summary of Call: Pt needs some information from his colonoscopy, says that BP spiked with sedation and needs that information for his cardiologist appt this afternoon Initial call taken by: [place] Johnson,  July 22, 2010 10:25 AM  Follow-up for Phone Call        Spoke with patient and gave him the information that I was able to get from endo at [facility]. Patient checked in with BP of 159/125, once anesthesia started it went up to 181/115. Once anesthesia on board it went down to 115/70. Patient aware of BP readings. Follow-up by: Selinda Michaels RN,  July 22, 2010 11:20 AM

## 2010-08-20 LAB — BASIC METABOLIC PANEL
BUN: 11 mg/dL (ref 6–23)
CO2: 24 mEq/L (ref 19–32)
Chloride: 103 mEq/L (ref 96–112)
Creatinine, Ser: 0.82 mg/dL (ref 0.4–1.5)
Glucose, Bld: 100 mg/dL — ABNORMAL HIGH (ref 70–99)

## 2010-08-20 LAB — DIFFERENTIAL
Basophils Relative: 0 % (ref 0–1)
Eosinophils Absolute: 0.3 10*3/uL (ref 0.0–0.7)
Monocytes Relative: 8 % (ref 3–12)
Neutrophils Relative %: 53 % (ref 43–77)

## 2010-08-20 LAB — CBC
MCHC: 34 g/dL (ref 30.0–36.0)
MCV: 86.8 fL (ref 78.0–100.0)
Platelets: 282 10*3/uL (ref 150–400)

## 2010-09-03 LAB — GLUCOSE, CAPILLARY
Glucose-Capillary: 104 mg/dL — ABNORMAL HIGH (ref 70–99)
Glucose-Capillary: 122 mg/dL — ABNORMAL HIGH (ref 70–99)

## 2010-09-03 LAB — FUNGUS CULTURE W SMEAR: Fungal Smear: NONE SEEN

## 2010-09-03 LAB — COMPREHENSIVE METABOLIC PANEL
ALT: 23 U/L (ref 0–53)
AST: 25 U/L (ref 0–37)
Albumin: 4.4 g/dL (ref 3.5–5.2)
CO2: 28 mEq/L (ref 19–32)
Calcium: 9.8 mg/dL (ref 8.4–10.5)
GFR calc Af Amer: >60 mL/min (ref >60)
GFR calc non Af Amer: >60 mL/min (ref >60)
Sodium: 139 mEq/L (ref 135–145)
Total Protein: 8 g/dL (ref 6.0–8.3)

## 2010-09-03 LAB — AFB CULTURE WITH SMEAR (NOT AT ARMC)

## 2010-09-03 LAB — CBC
MCHC: 35.3 g/dL (ref 30.0–36.0)
RBC: 4.89 MIL/uL (ref 4.22–5.81)

## 2010-09-03 LAB — CULTURE, RESPIRATORY W GRAM STAIN
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
## 2010-10-14 NOTE — Letter (Signed)
April 16, 2009   Suzanna Obey, MD  [address]  [place], [place] [address]   Re:  [name], [name]               DOB:  28-Mar-1951   Dear Dr. Jearld Fenton,   I saw the patient back today after his bronchoscopy.  He had a chronic  cough.  He still has a bad chronic cough.  His biopsy by Dr. Jearld Fenton  showed a benign polyp.  Pathology showed just inflammatory cells,  particularly in the superior segment of the left lower lobe.  We thought  there were some mild abnormalities.  This showed there were some  bronchial washings which were completely negative as well as the  brushings were negative in this area.  Because of his continued cough,  we felt he needed to be seen by a pulmonologist, so he is being referred  to Dr. Shelle Iron or one of his associates for evaluation.  I appreciate the  opportunity of seeing the patient.  I will be happy to see him again if  he has any future problems.   Ines Bloomer, M.D.  Electronically Signed   DPB/MEDQ  D:  04/16/2009  T:  04/17/2009  Job:  [id number]   cc:   Teena Irani. Arlyce Dice, M.D.
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
## 2010-10-17 NOTE — Op Note (Signed)
NAME:  [name], [name]                         ACCOUNT NO.:  [id number]   MEDICAL RECORD NO.:  [id number]                   PATIENT TYPE:  AMB   LOCATION:  DSC                                  FACILITY:  [facility]   PHYSICIAN:  Loreta Ave, M.D.              DATE OF BIRTH:  [birth year]   DATE OF PROCEDURE:  01/03/2004  DATE OF DISCHARGE:  01/03/2004                                 OPERATIVE REPORT   PREOPERATIVE DIAGNOSIS:  Chronic impingement right shoulder, partial  thickness tearing of rotator cuff, DJD AC joint.   POSTOPERATIVE DIAGNOSIS:  Chronic impingement right shoulder, partial  thickness tearing of rotator cuff, DJD AC joint.  With anterior labrum tear  as well as some focal grade III chondromalacia anterior glenoid.   PROCEDURE:  Right shoulder examination under anesthesia, arthroscopy,  debridement of glenohumeral joint including glenoid, labrum, and  undersurface of the rotator cuff, acromioplasty, CA ligament release, and  excision of distal clavicle.   SURGEON:  Loreta Ave, M.D.   ASSISTANT:  Arlys John D. Petrarca, P.A.-C.   ANESTHESIA:  General.   ESTIMATED BLOOD LOSS:  None.   SPECIMENS:  None.   CULTURES:  None.   COMPLICATIONS:  None.   DRESSING:  Soft compressive with sling.   DESCRIPTION OF PROCEDURE:  The patient was brought to the operating room and  placed on the operating table in the supine position.  After adequate  anesthesia had been obtained, the right shoulder examined.  Good motion and  stability.  Placed in a beach chair position on a shoulder positioner, and  prepped and draped in the usual sterile fashion.  Three standard portals  anterior, posterior, and lateral.  Shoulder __________ distended and  inspected.  Focal grade III changes from the glenoid debrided.  Not an  instability pattern.  Complex tearing of labrum debrided as well as partial  tearing undersurface of the cuff.  No full-thickness tears.  Biceps tendon  and biceps  anchor, remaining articular cartilage, capsular ligamentous  structures intact.  Cannula redirected subacromially.  Typical chronic  impingement with abrasive changes on top of the cuff, but no full-thickness  tear.  Type 3 acromion.  Grade IV changes AC joint with spurs.  Bursa  resected and the cuff debrided.  Acromioplasty to a type I acromion with  shaver and high speed bur.  CA ligament released with cautery.  Distal  clavicle exposed.  Grade IV changes.  Periarticular spurs and lateral cm of  clavicle resected.  Adequacy of decompression and clavicle  excision confirmed and viewed from all portals.  Instruments and fluid  removed.  Portals, shoulder, and bursa injected with Marcaine.  Portals  closed with 4-0 nylon.  Sterile compressive dressing applied.  Anesthesia  reversed.  Brought to the recovery room.  Tolerated the procedure well with  no complications.  Loreta Ave, M.D.    DFM/MEDQ  D:  01/06/2004  T:  01/07/2004  Job:  [id number]

## 2010-10-17 NOTE — Assessment & Plan Note (Signed)
[facility]                           GASTROENTEROLOGY OFFICE NOTE   ALANDIS, BLUEMEL                      MRN:          [id number]  DATE:01/26/2006                            DOB:          12/19/1950    PROBLEM:  Dysphagia.   HISTORY:  Mr. Callegari has returned complaining of recurrent dysphagia.  He has  a history of an esophageal stricture for which he has been dilated in the  plast.  Last dilatation was in December 2006.  He complains of occasional  postprandial abdominal distention and discomfort.  This has subsided in the  last week.  He complains of frequent nausea in the morning.  He is unable to  eat for several hours.  He believes this may be related to the onset of  TriCor and Vytorin therapy.  Finally, he has frequent postprandial nausea  and diaphoresis.  This is especially true when he eats high-carbohydrate  foods.  He feels like he is having hypoglycemic episodes.  He has no history  of diabetes.   MEDICATIONS:  Toprol, Lisinopril, Protonix, TriCor, Vytorin, and Demerol  p.r.n.   PHYSICAL EXAMINATION:  VITAL SIGNS:  Pulse 70, blood pressure 110/62, weight  219.   IMPRESSION:  1. Probable recurrent esophageal stricture.  2. Questionable episodes of hypoglycemia.  3. Nausea.  This could be medication related.  This includes TriCor,      Vytorin, or possibly Protonix.   RECOMMENDATIONS:  1. Check FBS, A1c, CMET, and CBC.  2. Upper endoscopy with balloon dilatation.  3. Referral to Dr. Romero Belling for workup of possible hypoglycemia.                                   Barbette Hair. Arlyce Dice, MD, [facility]   RDK/MedQ  DD:  01/26/2006  DT:  01/27/2006  Job #:  [id number]

## 2010-10-17 NOTE — Op Note (Signed)
NAME:  [name], [name]                         ACCOUNT NO.:  [id number]   MEDICAL RECORD NO.:  [id number]                   PATIENT TYPE:  AMB   LOCATION:  DSC                                  FACILITY:  [facility]   PHYSICIAN:  Loreta Ave, M.D.              DATE OF BIRTH:  [birth year]   DATE OF PROCEDURE:  09/13/2003  DATE OF DISCHARGE:  09/13/2003                                 OPERATIVE REPORT   PREOPERATIVE DIAGNOSIS:  Status post right total knee replacement with  recurrent synovitis and episodes hemarthrosis.   POSTOPERATIVE DIAGNOSIS:  Status post right total knee replacement with  recurrent synovitis and episodes hemarthrosis.   OPERATION PERFORMED:  Right knee examination under anesthesia, arthroscopy,  extensive tricompartmental synovectomy.   SURGEON:  Loreta Ave, M.D.   ASSISTANT:  Arlys John D. Petrarca, P.A.-C.   ANESTHESIA:  General.   ESTIMATED BLOOD LOSS:  Minimal.   TOURNIQUET TIME:  Not employed.   SPECIMENS:  None.   CULTURES:  None.   COMPLICATIONS:  None.   DRESSING:  Soft compressive.   DESCRIPTION OF PROCEDURE:  The patient was brought to the operating room and  after adequate anesthesia had been obtained, the right knee examined.  Full  extension, good stability.  Still a 2+ effusion.  Flexion better than 110  degrees.  Good alignment.  Tourniquet and leg holder applied.  Leg prepped  and draped in the usual sterile fashion.  Three portals were created, one  superolateral, one each medial and lateral parapatellar.  Inflow catheter  introduced.  Knee distended.  Drained about 40 mL of residual blood.  Arthroscope introduced.  All components intact.  No evidence of loosening or  significant wear.  A marked recurrence of a hypertrophic synovitis in all  compartments impinging between components.  Not nearly as much as we saw on  the left hemiarthroscopy for the recurrent synovitis but still present.  Hyperplastic pedunculated type.  Extensive  synovectomy in all compartments.  No evidence of polyethylene wear.  At completion hemostasis of all areas of  synovectomy.  All components  thoroughly visualized and intact.  No other findings.  Instruments and fluid  removed.  Portals of the knee injected with Marcaine.  Portals were closed  with 4-0 nylon.  Sterile compressive dressing applied.  Anesthesia reversed.  Brought to recovery room.  Tolerated surgery well.  No complications.                                               Loreta Ave, M.D.    DFM/MEDQ  D:  09/14/2003  T:  09/17/2003  Job:  [id number]

## 2010-10-17 NOTE — Op Note (Signed)
[name], [name]               ACCOUNT NO.:  [id number]   MEDICAL RECORD NO.:  [id number]          PATIENT TYPE:  AMB   LOCATION:  DAY                          FACILITY:  [facility]   PHYSICIAN:  Ollen Gross, M.D.    DATE OF BIRTH:  January 16, 1951   DATE OF PROCEDURE:  07/28/2006  DATE OF DISCHARGE:                               OPERATIVE REPORT   PREOPERATIVE DIAGNOSIS:  Right shoulder impingement, AC arthrosis,  rotator cuff tear.   POSTOPERATIVE DIAGNOSIS:  Right shoulder impingement, AC arthrosis,  rotator cuff tear.   PROCEDURE:  Right shoulder open subacromial decompression, distal  clavicle resection, rotator cuff repair.   SURGEON:  Ollen Gross, M.D.   ASSISTANT:  Avel Peace, PA-C   ANESTHESIA:  General standby.   ESTIMATED BLOOD LOSS:  Minimal.   DRAINS:  None.   COMPLICATIONS:  None.   CONDITION:  Stable to recovery.   CLINICAL NOTE:  [name] is a 60 year old male with severe right shoulder  pain, worsening dysfunction over the past several months to year.  He  has had injections in the past with partial temporary relief.  MRI  showed a supraspinatus tear which is recurrent in nature.  He had a  previous fixation several years ago.  Due progressively worsening pain,  dysfunction, he presents for the above-mentioned procedure.   PROCEDURE IN DETAIL:  Successful administration of general anesthetic,  the patient was placed sitting upright beach-chair positioner right  upper extremity shoulder girdle isolated from his trunk with plastic  drapes and prepped and draped in usual sterile fashion.  Incision was  made longitudinal in nature anterior-posterior from the mid aspect of  the acromion.  The skin cut with a 10 blade through subcutaneous tissue  to the level of the deltoid fascia.  Subcutaneous flaps elevated  circumferentially.  A longitudinal incision was then made starting at  the distal 1-2 cm of the clavicle and coursing across the anterior  aspect of  the acromion.  The entire anterior soft tissue sleeve is  subperiosteally elevated anteriorly and posteriorly just elevated over  the distal clavicle.  Retraction placed around the clavicle.  It did not  appear that he had a previous resection and if so it was minimal.  We  resected about a centimeter of the distal clavicle.  This effectively  decompressed the [facility] joint.  The previous acromioplasty shows evidence  still of a very small spur.  This is removed.  We then inspected rotator  cuff.  Very thick some omental bursas present and that is excised.  There was no obvious tear but it was palpable that there was a defect at  the greater tuberosity at the surface of the infraspinatus and  supraspinatus.  The bursal tissue was removed and the tear became  obvious.  The same Mitek anchors were then passed into a trough and the  edge of the tendon was advanced down into the trough and oversewn with  the Ethibond suture.  I put it through a range of motion.  This is a  very stable repair.  The wound was then irrigated  and the deltoid  reattached to the acromion through drill holes with Ethibond suture.  The fascia over the clavicle was closed as was the small split the  deltoid.  Subcu is then closed interrupted 2-0 Vicryl subcuticular  running 4-0 Monocryl.  About 30 mL of 0.25% Marcaine with epi injected  the subcu tissues and subacromial space.  Steri-Strips and bulky sterile  dressing applied and he was placed into a shoulder immobilizer,  awakened, transported to recovery in stable condition.      Ollen Gross, M.D.  Electronically Signed     FA/MEDQ  D:  07/28/2006  T:  07/28/2006  Job:  [id number]

## 2010-10-17 NOTE — Discharge Summary (Signed)
NAME:  [name], [name]                         ACCOUNT NO.:  [id number]   MEDICAL RECORD NO.:  [id number]                   PATIENT TYPE:  INP   LOCATION:  5033                                 FACILITY:  [facility]   PHYSICIAN:  Loreta Ave, M.D.              DATE OF BIRTH:  07/13/1948   DATE OF ADMISSION:  11/29/2002  DATE OF DISCHARGE:  12/03/2002                                 DISCHARGE SUMMARY   ADMISSION DIAGNOSIS:  Advanced degenerative joint disease of the right knee.   DISCHARGE DIAGNOSES:  1. Same.  2. Hypertension.  3. Hypokalemia.   PROCEDURE:  Right total knee replacement.   HISTORY OF PRESENT ILLNESS:  This is a 60 year old white male with  persistent pain and discomfort in the right knee.  He has had marked  conservative treatment including arthroscopy injections, which have failed.  He is now indicated for right total knee replacement.   HOSPITAL COURSE:  This 60 year old white male was admitted November 29, 2002.  After appropriate laboratory studies were obtained, he was taken to the  operating room where he underwent a right total knee replacement.  He  tolerated the procedure well.  He was placed on a morphine PCA pump  postoperatively with high dose protocol.  Ancef 1 g IV q.8h. x 3 doses  postoperatively.  Heparin 5000 units subcutaneously q.12h. until his  Coumadin became therapeutic.  Mepergan four doses were given for pain.  Hemovacs were planned for four hours and then charged.  CPM was placed 0-30  degrees for eight hours per day and increased by 10 degrees a day.  Consultation with PT and OT were made.  He was allowed out of bed to the  chair the following day.  He was KVO on postoperative day #1.  Dressing was  changed on July 2 with discontinuation of Hemovac.  His PCA was also  discontinued at that time.  The remainder of his hospital course was  uneventful except for some hypokalemia, which was corrected with K-Dur.  He  was also placed on Keflex  500 mg p.o. q.i.d. for seven days.  He was  discharged on July 4 to return back to the office in 7-10 days for recheck  evaluation.   LABORATORY DATA:  He was admitted with hemoglobin 12.9, hematocrit 36.8%,  white count 8700, platelet 198,000.  Discharge hemoglobin 11.1, hematocrit  31.8%, white count 10,400, with platelets 239,000.  Preoperative sodium was  132, potassium 4.1, chloride 97, CO2 30, glucose 121, BUN 10, creatinine  1.1, calcium 9.2.  Discharge sodium 130, potassium 3.4, chloride 91, CO2 31,  glucose 136, BUN 16, creatinine 1.3, calcium 8.6.  Pro time at discharge was  13.7 with an INR 1.0.   DISCHARGE MEDICATIONS:  1. He was given a prescription for [place] one to two tablets every     four to six hours as needed for pain.  2. Coumadin 10 mg; he was given one and a half tablets on Monday and will be     followed by [place].  3. Keflex 500 mg one tablet q.i.d. for seven days.  4. Toprol XL 100 mg daily.  5. Medications as noted preoperatively.  6. Colace 100 mg one tablet b.i.d.  7. Senokot two tablets with dinner.   DISCHARGE INSTRUCTIONS:  No restrictions on diet.  Activity as tolerated and  PT.  Keep his wound clean and dry.  Call for an appointment to be seen back  on July 13 or 14.  Discharged in improved condition.     Oris Drone Petrarca, P.A.-C.                Loreta Ave, M.D.    BDP/MEDQ  D:  12/18/2002  T:  12/19/2002  Job:  [id number]

## 2010-10-17 NOTE — Op Note (Signed)
NAMEHARRIS, [name] NO.:  [id number]   MEDICAL RECORD NO.:  [id number]          PATIENT TYPE:  AMB   LOCATION:  DAY                          FACILITY:  [facility]   PHYSICIAN:  Ollen Gross, M.D.    DATE OF BIRTH:  1950/06/29   DATE OF PROCEDURE:  06/29/2005  DATE OF DISCHARGE:                                 OPERATIVE REPORT   PREOP DIAGNOSIS:  Right knee synovitis/patellar clunk syndrome.   POSTOPERATIVE DIAGNOSIS:  Right knee synovitis/patellar clunk syndrome.   PROCEDURE:  Right knee arthroscopy with synovial debridement.   SURGEON:  Ollen Gross, M.D.   ASSISTANT:  None.   ANESTHESIA:  General.   ESTIMATED BLOOD LOSS:  Minimal.   DRAINS:  None.   COMPLICATIONS:  None.   CONDITION:  Stable to recovery.   BRIEF CLINICAL NOTE:  [name] is a 60 year old male who underwent revision  right total knee arthroplasty approximately a year ago. He had done  extremely well until a few months when turned gets of popping and catching  in the joint. The knee would feel like it wanted to lock up at the times.  It especially happened with getting up out of a chair or stairs.  He did  have a lot of crepitus on range of motion consistent with a patellar-clunk-  type syndrome. He admits now for arthroscopy and synovial debridement.   PROCEDURE IN DETAIL:  After the successful administration of general  anesthetic, the tourniquet was placed down in the right thigh; and the right  lower extremity prepped and draped in the usual sterile fashion. A standard  superomedial inferolateral incision was made and flow cannula as passed  superomedial, and camera passed inferolateral. Arthroscopic visualization  proceeds.   At the junction of the quad tendon and patella there is an area of synovial  hypertrophy consistent with the lesion found in the patellar-clunk syndrome.  We then created an inferomedial portal, made a small incision, and passed  the ArthroCare device. We  debrided the inferior most aspects of this with  the ArthroCare; and then switched the inflow cannula to inferomedial, and  the working portal to superomedial. I used the ArthroCare, then, to debride  the remainder of the hypertrophic synovium adjacent to the patella. When we  got it flattened in line with the quadriceps tendon, then felt as though all  the synovium had been debrided. We inspected the rest the joint; and no  other  abnormalities are noted. Arthroscopic equipment was then removed; and the  portals were closed with interrupted 4-0 nylon. Then 20 mL of 1/4% Marcaine  with epinephrine were injected into the knee. A bulky sterile dressing is  applied; and he is awakened and transported to recovery in stable condition.      Ollen Gross, M.D.  Electronically Signed     FA/MEDQ  D:  06/29/2005  T:  06/30/2005  Job:  [id number]
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
## 2010-10-17 NOTE — Op Note (Signed)
[name], KANE NO.:  [id number]   MEDICAL RECORD NO.:  [id number]          PATIENT TYPE:  INP   LOCATION:  X001                         [facility]ACILITY:  [facility]   PHYSICIAN:  Ollen Gross, M.D.    DATE OF BIRTH:  Mar 19, 1951   DATE OF PROCEDURE:  07/02/2004  DATE OF DISCHARGE:                                 OPERATIVE REPORT   PREOPERATIVE DIAGNOSIS:  Unstable right total knee arthroplasty.   POSTOPERATIVE DIAGNOSIS:  Unstable right total knee arthroplasty.   PROCEDURE:  Right total knee arthroplasty revision.   SURGEON:  Ollen Gross, M.D.   ASSISTANT:  Alexzandrew L. Julien Girt, P.A.   ANESTHESIA:  General with postoperative Marcaine pain pump.   ESTIMATED BLOOD LOSS:  Minimal.   DRAIN:  Hemovac x1.   TOURNIQUET TIME:  Up for 56 minutes at 300 mmHg, then down for 10 minutes,  and up an additional 27 minutes at 300 mmHg.   COMPLICATIONS:  None.   CONDITION:  Stable to the recovery room.   INDICATIONS FOR PROCEDURE:  The patient is a 60 year old male who had a  right total knee arthroplasty done approximately 1-1/2 to 2 years ago.  He  has had recurrent effusions, and the knee has been giving out on him  frequently.  On examination he has a fair amount of instability in flexion.  His worst problems are getting up and down and trying to do stairs.  Given  the recurrent effusions and the instability, it is felt that he would  require revision surgery.  He presents now for a total knee arthroplasty  revision.   DESCRIPTION OF PROCEDURE:  After the successful administration of general  anesthetic, a tourniquet is placed high on the right thigh and the right  lower extremity is prepped and draped in the usual sterile fashion.  The  extremity is wrapped in an Esmarch, the knee flexed, and the tourniquet  inflated to 300 mmHg.  A standard midline incision is made with the #10  blade through the subcutaneous tissue to the level of the extensor mechanism  where a fresh blade is used to make a medial parapatellar arthrotomy.  We  encountered a small amount of fluid, which was sent for stat Gram's stain  and C&S, which was negative.  I did a thorough synovectomy and sent three  separate specimens for frozen section, all of which came back without  neutrophils.  The patella had been everted and the knee flexed 90 degrees,  and the soft tissue with the proximal and medial tibia sub-periosteally  elevated to the joint line.  We then removed the tibial polyethylene.  There  is no gross damage to the polyethylene.  I was unable to remove the femoral  component by disrupting interface between the component and the stent.  We  removed it with minimal if any bone loss.  The tibial component, on the  other hand, was easily removed.  We disrupted the interface and then with  gentle disruption was able to remove the component.  It was consistent with  it being loose, especially on the  medial side, where there was a small  amount of bone loss and fibrous tissue at the interface between the cement  and bone.  I removed all the cement and was able to access both of the  femoral tibial canals.  The canals were thoroughly irrigated.  Reaming is  performed in the femoral canal, up to 22 mm, the tibial canal at 16 mm.  The  intramedullary cutting guide is placed on the tibia, and we planned the  resection level about 2.0 mm off of the already cut bone surface.  Resection  is made with an oscillating saw.  A size #4 is the most appropriate tibial  component.  I used a trial size #4 there with the 16 x 60 stem and we had a  good fit on the cut bony surface.  I then did the proximal reaming for the  MBT mobile revision tray.  We did a keel punch after that for the tray.   I removed the tibial trial and placed the 22 mm sleeve into the femoral  canal.  We placed a 5-degree right valgus alignment guide and resected no  bone medially, and had to go up 4 mm to get any  bone laterally.  We thus  used a 4 mm distal lateral augment.  The AP cutting guide is placed in the  +2 position, to effectively raise the stem and lower the phalange so it  rests on the anterior cortex of the femur.  We made the posterior cut also.  The revision block is then placed for the inter-condylar cut and chamfer  cut.  We cut this for a TC3 revision component.  Of note, in order to  achieve our rotation, access on the AP cuts, we then placed a 15 mm spacer  block and held the knee in 90 degrees of flexion and rotated, so that we  have an equal gap medially and laterally in 90 degrees of flexion.  This did  correspond with the epicondylar access.  Once the femoral cuts were  completed, then the femoral trial is placed, which is a size #4 with a 22 x  125 stem, and a 4 mm distal lateral augment.  The tibial trial was placed,  which is the size #4 MBT revision trial, with a 15 x 60 stem extension.  We  started with a 15 mm insert, in which he hyper-extended and then a 17.5 mm  insert.  There was also slight hyper-extension.  We went up to a 20 mm  insert, which allowed for full extension with excellent varus and valgus  stability, with a full range of motion, only up to about 140 degrees of  flexion.  The patella was cleared of all the soft tissue, and there was no  wear on the component.  We did a little patellaplasty to remove any marginal  spurs, and left the component in place.  It tracked normally.   We then released the tourniquet for a total time of 56 minutes.  On the back  table, the components were assembled.  We checked for any bleeding, and  there was no significant bleeding present.  Once the tourniquet was down for  10 minutes, we re-wrapped the leg in an Esmarch and inflated back to 300  mmHg.  We then trialed for the cement restricters on the tibial side, and a  size #5 was the most appropriate.  The size #5 restricter was then placed at the appropriate depth  in the  tibial canal.  All the cut bone surfaces are  prepared with pulsatile lavage.  The permanent components were assembled on  the back table.  Cement is mixed and once ready for implantation, is  injected into the tibial canal and pressurized.  The tibial component is  impacted.  The femoral component is also cemented into position.  We put a  trial 20 mm insert and held the knee in full extension, and removed all the  extruded cement.  Once the cement was fully hardened, a permanent 20 mm TC3  rotating platform implant is placed.  The wound is then copiously irrigated  with saline solution, and the extensor mechanism closed over a Hemovac drain  with interrupted #1 PDS.  Flexing against gravity is about 130 degrees.  The  tourniquet is released for a second tourniquet time of 27 minutes.  The  total tourniquet time is 83 minutes.  The subcutaneous tissues are then  closed with interrupted #2-0 Vicryl and the skin with staples.  The catheter  for a Marcaine pain pump is placed, and the pump initiated.  A bulky sterile  dressing is applied.  He was placed into a knee immobilizer.   He was awakened and transported to the recovery room in stable condition.      FA/MEDQ  D:  07/02/2004  T:  07/02/2004  Job:  [id number]

## 2010-10-17 NOTE — Op Note (Signed)
[place]  Patient:    [name], [name]                      MRN: [id number] Proc. Date: 12/09/00 Adm. Date:  [id number] Attending:  Colbert Ewing                           Operative Report  PREOPERATIVE DIAGNOSIS:  Right knee traumatic chondral disruption patellofemoral joint and medial meniscus tear.  Status post ACL reconstruction in the past.  POSTOPERATIVE DIAGNOSIS:  Right knee traumatic condyle disruption patellofemoral joint and medial meniscus tear.  Status post ACL reconstruction in the past.  Complex tearing medial and lateral meniscus.  Significant grade III chondral lesion patellofemoral joint with chondral loose bodies.  PROCEDURE:  Right knee examination under anesthesia, arthroscopy, debridement of medial and lateral meniscus.  Lysis and resection of anterior adhesions. Removal of chondral loose bodies.  Extensive chondroplasty patellofemoral joint.  SURGEON:  Loreta Ave, M.D.  ASSISTANT:  Arlys John D. Petrarca, P.A.-C.  ANESTHESIA:  General anesthesia.  ESTIMATED BLOOD LOSS:  Minimal.  SPECIMENS:  None.  CULTURES:  None.  COMPLICATIONS:  None.  DRESSING:  Soft compressive.  DESCRIPTION OF PROCEDURE:  The patient was brought to the operating room and placed on the operating table in the supine position.  After adequate anesthesia had been obtained, the right knee was examined.  Full motion marked patellofemoral crepitus, but good tracking and stability.  Negative Lockman and drawer.  Tourniquet and legholder applied.  Leg prepped and draped in the usual sterile fashion.  Three portals created, one superolateral and one each medial and lateral parapatellar.  The inflow catheter was introduced, the knee distended.  The arthroscope introduced and the knee inspected.  Marked traumatic, relatively acute grade III lesion on the patella and throughout much of the trochlea.  Debrided to a stable surface leaving a  deep grade III lesion, but all chondral loose bodies and flaps removed.  Changes on the patella, but not as extreme to chondroplasty as well.  Good tracking. Anterior and medial and lateral adhesions all resected.  ACL graft intact, a little adhesions anteriorly, debrided.  Medial compartment grade III changes, nothing new in the way of acute chondral abnormality.  Previous partial medial meniscectomy with further complex tearing of remaining rim.  Debrided to a stable surface.  Lateral meniscus had flaps off the anterior and middle thirds, saucerized out.  Most of the lateral compartment looked quite good and a lot of the lateral meniscus retained.  Entire knee examined and no other findings appreciated.  Instruments and fluid removed.  Portals of the knee injected with Marcaine.  Portals closed with 4-0 nylon.  A sterile compressive dressing applied.  Anesthesia reversed and brought to the recovery room. Tolerated the surgery well with no complications. DD:  12/09/00 TD:  12/09/00 Job: [id number]
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
## 2010-10-17 NOTE — Consult Note (Signed)
[facility]                            ENDOCRINOLOGY CONSULTATION   JOHARI, PINNEY                      MRN:          [id number]  DATE:02/26/2006                            DOB:          November 20, 1950    REFERRING PHYSICIAN:  Barbette Hair. Arlyce Dice, MD,FACG   REASON FOR REFERRAL:  Hypoglycemia.   HISTORY OF PRESENT ILLNESS:  [age]-year-old man with 1 year of  intermittent episodes of weakness of all 4 extremities.  He has associated  severe diaphoresis as well as slight nausea, confusion, and palpitations.  He has had about 6 total episodes.  The episodes appear to come on in the  postprandial context.   PAST MEDICAL HISTORY:  1. Hyperglycemia.  2. Hypertension.  3. Dyslipidemia.  4. Painful peripheral neuropathy.  5. Partial gastrectomy in 1979.   SOCIAL HISTORY:  He is a retired Emergency planning/management officer.  He is married.   FAMILY HISTORY:  Positive for diabetes in both parents and two siblings.   REVIEW OF SYSTEMS:  Denies the following:  weight gain, weight loss,  syncope, vomiting, and skin rash.   PHYSICAL EXAMINATION:  VITAL SIGNS:  Blood pressure 131/85, heart rate 58,  temperature 98.9, weight 216.  GENERAL:  No distress.  SKIN:  Not diaphoretic.  HEENT:  No proptosis.  No periorbital swelling.  NECK:  Thyroid is normal.  CHEST:  Clear to auscultation.  No respiratory distress.  CARDIOVASCULAR:  No JVD.  No edema.  Regular rate and rhythm.  No murmur.  Pedal pulses are intact.  NEUROLOGIC:  Alert, well-oriented.  Does not appear anxious nor depressed,  and sensation is decreased to touch in both feet.  SKIN:  No rash is seen.   LABORATORY STUDIES:  Electrocardiogram today is normal.   IMPRESSION:  1. History of hyperglycemia.  Given this and his extensive family history,      he has an approximately 10% annual risk of developing diabetes.  2. History of partial gastrectomy, which along with #1 could place him at      risk for  hypoglycemia.   Symptom complex as described above, uncertain etiology.   PLAN:  1. We discussed the importance of diet and exercise therapy, and we      discussed the risk of him developing diabetes.  2. Check 24-hour urine for metanephrines and catecholamines.  3. I gave him a glucose meter and a prescription for strips, and asked him      to check his glucose if he should develop these symptoms again.  4. Return in about 3 weeks.            ______________________________  Cleophas Dunker Everardo All, MD     SAE/MedQ  DD:  02/28/2006  DT:  03/01/2006  Job #:  [id number]   cc:   Barbette Hair. Arlyce Dice, MD,FACG
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
## 2010-10-17 NOTE — Letter (Signed)
April 18, 2009   Priscille Heidelberg. Pamalee Leyden, MD  [address]  [place], [place] [address]   Re:  SELSO, MANNOR               DOB:  07-24-1950   Dear Dr. Tanya Nones;   Dr. Jearld Fenton and I did a bronch and direct laryngoscopy on the patient, and  our pathology findings were really benign and nothing that would have  cause this hemoptysis.  He still has a terrible cough, and I thought the  thing to do was to refer him to a pulmonologist but apparently, we  elected to do that.  He had seen Dr. Sherene Sires before.  He apparently had  been told that if he has cough, he should come off lisinopril or an ACE  inhibitor and which he did and that according to him, causes blood  pressure to go back up, so he is back on lisinopril 40 mg twice a day.  With his previous history of cerebrovascular accidents, I agree that his  blood pressure is very important, but as far as his cough, I did  encourage him to probably stop the lisinopril since ACE inhibitors do  cause cough and to try something else, and he will be seeing you for  that.  The other problem that I think is probably, he has allergic  bronchitis, and it might be worth referring him back down to [facility] to see  if they can help him from that standpoint, since he and Dr. Sherene Sires are  unable to solve the situation.  I will be happy to help in anyway I can.    Sincerely,   Ines Bloomer, M.D.  Electronically Signed   DPB/MEDQ  D:  04/18/2009  T:  04/19/2009  Job:  [id number]   cc:   Loraine Leriche L. Vear Clock, M.D.  Suzanna Obey, M.D.

## 2010-11-26 ENCOUNTER — Telehealth: Payer: Self-pay | Admitting: Gastroenterology

## 2010-11-27 NOTE — Telephone Encounter (Signed)
Pt aware of Dr. Kaplan's recommendations. 

## 2010-11-27 NOTE — Telephone Encounter (Signed)
Pt states he saw his PCP yesterday, Dr. Modesto Charon, and he thought that he had an obstruction. Suggested the pt go to the ER. Pt went but did not stay, it was packed. Pt states that he thinks with clear liquids and rest it is resolving. His PCP told him to call Dr. Arlyce Dice about possible labs and an xray. Dr. Arlyce Dice please advise.

## 2010-11-27 NOTE — Telephone Encounter (Signed)
Left message for pt to call back  °

## 2010-11-27 NOTE — Telephone Encounter (Signed)
If symptoms worsen he needs to go to ER.  Stay on clears until better.

## 2011-03-12 ENCOUNTER — Encounter (INDEPENDENT_AMBULATORY_CARE_PROVIDER_SITE_OTHER): Payer: Self-pay | Admitting: General Surgery

## 2011-03-12 ENCOUNTER — Inpatient Hospital Stay (HOSPITAL_COMMUNITY): Admission: RE | Admit: 2011-03-12 | Payer: Self-pay | Source: Ambulatory Visit

## 2011-03-12 ENCOUNTER — Ambulatory Visit (INDEPENDENT_AMBULATORY_CARE_PROVIDER_SITE_OTHER): Payer: MEDICARE | Admitting: General Surgery

## 2011-03-12 ENCOUNTER — Other Ambulatory Visit (HOSPITAL_COMMUNITY): Payer: Self-pay

## 2011-03-12 VITALS — BP 180/110 | HR 100 | Temp 98.4°F | Resp 24 | Ht 73.5 in | Wt 242.0 lb

## 2011-03-12 DIAGNOSIS — K56600 Partial intestinal obstruction, unspecified as to cause: Secondary | ICD-10-CM

## 2011-03-12 DIAGNOSIS — K56609 Unspecified intestinal obstruction, unspecified as to partial versus complete obstruction: Secondary | ICD-10-CM

## 2011-03-12 LAB — BASIC METABOLIC PANEL
BUN: 21 mg/dL (ref 6–23)
Potassium: 3.8 mEq/L (ref 3.5–5.3)
Sodium: 137 mEq/L (ref 135–145)

## 2011-03-12 NOTE — Progress Notes (Signed)
Chief Complaint  Patient presents with  . Other    eval of chronic abdominal pain     HPI [name] is a 60 y.o. male.  This patient is known to our practice is a patient of Dr. Ardine Eng.  He has been seen by him in the past for what sounds like ischemic colitis and has had 2 previous laparotomies and lysis of adhesions by him. He has a complicated abdominal history multiple prior laparotomies for trauma. He is referred today by Dr. Modesto Charon for evaluation of 2 months of right upper quadrant pain. He states this is constant and describes as "stabbing" without radiation. He does get some relief with only liquid diet but he states that this is worse with eating solid foods.He also complains of abdominal distention which is new for him. He has persistent nausea with occasional vomiting and vomited twice yesterday. He states that his bowels are in usually fairly regular and moves his bowels every other day with his last bowel movement this morning. He had a liquid stool yesterday and 2 small, hard stools today. He had a normal colonoscopy recently but no previous upper endoscopy. He states that he had some plain x-rays but these are not available for review today.He has had several admissions in the past for partial bowel structures and has had at least 3 surgeries for lysis of adhesions for obstructions. HPI  Past Medical History  Diagnosis Date  . Diabetes mellitus   . Hyperlipidemia   . Hypertension   . GERD (gastroesophageal reflux disease)   . Asthma   . Stroke   . Colon polyps   . Anemia   . Depression   . Arthritis   . Clotting disorder   . COPD (chronic obstructive pulmonary disease)   . Neuromuscular disorder     Agent orange   . Hearing loss   . Wheezing   . Leg swelling   . Abdominal pain   . Abdominal distention   . Nausea and vomiting   . Diarrhea   . Constipation   . Fainting   . Weakness   . Skin rash     Past Surgical History  Procedure Date  . Joint replacement      right knee replaced 2x  . Colonoscopy     Family History  Problem Relation Age of Onset  . Stroke Mother   . Heart disease Mother   . Heart disease Father     Social History History  Substance Use Topics  . Smoking status: Never Smoker   . Smokeless tobacco: Never Used  . Alcohol Use: Yes    Allergies  Allergen Reactions  . Aspirin   . Atorvastatin     REACTION: myalgias  . Butorphanol Tartrate   . Codeine   . Dilaudid (Hydromorphone Hcl)     Aggressive   . Doxycycline   . Iohexol      Desc: hives,neck and torso erythemia   . Latex   . Methocarbamol     rash  . Oxycodone Hcl   . Pentazocine Lactate   . Synvisc (Hylan G-F 20)     rash  . Talwin     rash    Current Outpatient Prescriptions  Medication Sig Dispense Refill  . albuterol (PROVENTIL) (2.5 MG/3ML) 0.083% nebulizer solution Take 2.5 mg by nebulization every 4 (four) hours as needed.        . ALPRAZolam (XANAX) 1 MG tablet Take 1 mg by mouth at bedtime as  needed.        . furosemide (LASIX) 40 MG tablet       . HYOSCYAMINE PO Take 37.5 mg by mouth daily.        [name] losartan (COZAAR) 100 MG tablet       . morphine (MSIR) 30 MG tablet       . promethazine (PHENERGAN) 25 MG tablet       . triamterene-hydrochlorothiazide (DYAZIDE) 37.5-25 MG per capsule Take 1 capsule by mouth daily.          Review of Systems Review of Systems  HENT: Positive for hearing loss.   Respiratory: Positive for wheezing.   Cardiovascular: Positive for leg swelling.  Gastrointestinal: Positive for nausea, vomiting, abdominal pain, diarrhea, constipation and abdominal distention.  Musculoskeletal: Positive for arthralgias.  Skin: Positive for wound.  Neurological: Positive for syncope and weakness.  All other systems reviewed and are negative.    Blood pressure 180/110, pulse 100, temperature 98.4 F (36.9 C), resp. rate 24, height 6' 1.5" (1.867 m), weight 242 lb (109.77 kg).  Physical Exam Physical Exam    Constitutional: He is oriented to person, place, and time. He appears well-developed and well-nourished. No distress.  HENT:  Head: Normocephalic and atraumatic.  Eyes: Conjunctivae are normal. Pupils are equal, round, and reactive to light. Right eye exhibits no discharge. Left eye exhibits no discharge. No scleral icterus.  Neck: Normal range of motion. Neck supple. No tracheal deviation present.  Cardiovascular: Normal rate, regular rhythm and normal heart sounds.   Pulmonary/Chest: Effort normal and breath sounds normal. No stridor. No respiratory distress. He has no wheezes.  Abdominal: Soft. Bowel sounds are normal. He exhibits distension. He exhibits no mass. There is tenderness. There is no rebound and no guarding.       His abdomen is soft and he has mild right-sided tenderness, mild distention and mild tympani. He has a small reducible incisional hernia just to the right and cephalad to his umbilicus without evidence of incarceration. He has no masses and no peritoneal signs he has a well-healed surgical scars throughout his abdomen consistent with multiple prior abdominal surgeries  Musculoskeletal: Normal range of motion.  Neurological: He is alert and oriented to person, place, and time.  Skin: Skin is warm and dry. He is not diaphoretic.  Psychiatric: He has a normal mood and affect. His behavior is normal. Judgment and thought content normal.    Data Reviewed   Assessment    Abdominal pain and distention. It sounds like he may have chronic partial bowel obstruction given his multiple abdominal procedures. He has already had multiple laparotomies with lysis of adhesions for bowel extraction symptoms. I recommended a CT scan of the abdomen to evaluate and possible admission to the hospital today if obstruction is found. He did not want to go straight to the hospital for admission. He is tolerating liquids so since he did not want admission for treatment of this and further workup,  I recommended that he remain on a liquid diet until we can obtain a CT scan to further see what is causing his symptoms. If this is due to adhesions, and I would recommend nonoperative management if possible. Any procedure would be high risk given his multiple abdominal procedures.    Plan    We will set him up for a CT scan of the abdomen and his obstructive symptoms are found then I will recommend admission to the hospital. In the meantime I would continue on  liquid diet and return to the emergency room if symptoms worsen.       Lodema Pilot DAVID 03/12/2011, 1:12 PM

## 2011-03-16 ENCOUNTER — Emergency Department (HOSPITAL_COMMUNITY)
Admission: EM | Admit: 2011-03-16 | Discharge: 2011-03-17 | Disposition: A | Discharge status: Home / Self Care | Payer: Medicare Other | Attending: Emergency Medicine | Admitting: Emergency Medicine

## 2011-03-16 ENCOUNTER — Encounter (HOSPITAL_COMMUNITY): Payer: Self-pay | Admitting: *Deleted

## 2011-03-16 ENCOUNTER — Emergency Department (HOSPITAL_COMMUNITY): Payer: Medicare Other

## 2011-03-16 DIAGNOSIS — E785 Hyperlipidemia, unspecified: Secondary | ICD-10-CM | POA: Insufficient documentation

## 2011-03-16 DIAGNOSIS — Z860100 Personal history of colon polyps, unspecified: Secondary | ICD-10-CM | POA: Insufficient documentation

## 2011-03-16 DIAGNOSIS — J45909 Unspecified asthma, uncomplicated: Secondary | ICD-10-CM | POA: Insufficient documentation

## 2011-03-16 DIAGNOSIS — R109 Unspecified abdominal pain: Secondary | ICD-10-CM | POA: Insufficient documentation

## 2011-03-16 DIAGNOSIS — K219 Gastro-esophageal reflux disease without esophagitis: Secondary | ICD-10-CM | POA: Insufficient documentation

## 2011-03-16 DIAGNOSIS — K5900 Constipation, unspecified: Secondary | ICD-10-CM | POA: Insufficient documentation

## 2011-03-16 DIAGNOSIS — E119 Type 2 diabetes mellitus without complications: Secondary | ICD-10-CM | POA: Insufficient documentation

## 2011-03-16 DIAGNOSIS — N3289 Other specified disorders of bladder: Secondary | ICD-10-CM | POA: Insufficient documentation

## 2011-03-16 LAB — URINALYSIS, ROUTINE W REFLEX MICROSCOPIC
Glucose, UA: 100 — AB
Specific Gravity, Urine: 1.014
pH: 5.5

## 2011-03-16 LAB — CBC
HCT: 40.8
HCT: 41.5 % (ref 39.0–52.0)
Hemoglobin: 14
Hemoglobin: 14.4
MCV: 82.7
Platelets: 259
RBC: 4.68 MIL/uL (ref 4.22–5.81)
RBC: 4.93
RDW: 13.6 % (ref 11.5–15.5)
RDW: 13.9
WBC: 12.3 — ABNORMAL HIGH
WBC: 5.6 10*3/uL (ref 4.0–10.5)

## 2011-03-16 LAB — I-STAT 8, (EC8 V) (CONVERTED LAB)
BUN: 15
Chloride: 103
pCO2, Ven: 29.3 — ABNORMAL LOW
pH, Ven: 7.503 — ABNORMAL HIGH

## 2011-03-16 LAB — DIFFERENTIAL
Basophils Absolute: 0
Basophils Absolute: 0 10*3/uL (ref 0.0–0.1)
Lymphocytes Relative: 21
Lymphocytes Relative: 37 % (ref 12–46)
Lymphs Abs: 2.1 10*3/uL (ref 0.7–4.0)
Monocytes Absolute: 0.6
Monocytes Absolute: 0.7 10*3/uL (ref 0.1–1.0)
Neutro Abs: 2.7 10*3/uL (ref 1.7–7.7)
Neutro Abs: 7.8 — ABNORMAL HIGH

## 2011-03-16 LAB — COMPREHENSIVE METABOLIC PANEL
ALT: 36 U/L (ref 0–53)
AST: 81 U/L — ABNORMAL HIGH (ref 0–37)
CO2: 29 mEq/L (ref 19–32)
Chloride: 96 mEq/L (ref 96–112)
GFR calc non Af Amer: >90 mL/min (ref >90)
Sodium: 137 mEq/L (ref 135–145)
Total Bilirubin: 1.3 mg/dL — ABNORMAL HIGH (ref 0.3–1.2)

## 2011-03-16 LAB — APTT: aPTT: 22 — ABNORMAL LOW

## 2011-03-16 LAB — POCT CARDIAC MARKERS
CKMB, poc: 3.2
Myoglobin, poc: 89.3

## 2011-03-16 LAB — BASIC METABOLIC PANEL
Chloride: 98
GFR calc Af Amer: >60
GFR calc non Af Amer: >60
Potassium: 3.3 — ABNORMAL LOW
Sodium: 132 — ABNORMAL LOW

## 2011-03-16 LAB — POCT I-STAT CREATININE: Creatinine, Ser: 0.8

## 2011-03-16 LAB — SALICYLATE LEVEL: Salicylate Lvl: <4

## 2011-03-16 LAB — RAPID URINE DRUG SCREEN, HOSP PERFORMED
Barbiturates: NOT DETECTED
Benzodiazepines: POSITIVE — AB
Cocaine: NOT DETECTED

## 2011-03-16 LAB — HEPATIC FUNCTION PANEL
Albumin: 3.6
Total Bilirubin: 1.6 — ABNORMAL HIGH
Total Protein: 7.8

## 2011-03-16 LAB — ACETAMINOPHEN LEVEL: Acetaminophen (Tylenol), Serum: <10 — ABNORMAL LOW

## 2011-03-16 LAB — PROTIME-INR: INR: 0.9

## 2011-03-16 LAB — HEMOGLOBIN A1C: Hgb A1c MFr Bld: 5.5

## 2011-03-16 LAB — CARDIAC PANEL(CRET KIN+CKTOT+MB+TROPI)
CK, MB: 2.9
Relative Index: 2.6 — ABNORMAL HIGH
Total CK: 112

## 2011-03-16 LAB — ETHANOL: Alcohol, Ethyl (B): <5

## 2011-03-16 MED ORDER — SODIUM CHLORIDE 0.9 % IV SOLN
Freq: Once | INTRAVENOUS | Status: AC
  Administered 2011-03-16: 23:00:00 via INTRAVENOUS

## 2011-03-16 MED ORDER — ONDANSETRON HCL 4 MG/2ML IJ SOLN
4.0000 mg | Freq: Once | INTRAMUSCULAR | Status: AC
  Administered 2011-03-16: 4 mg via INTRAVENOUS
  Filled 2011-03-16: qty 2

## 2011-03-16 MED ORDER — MORPHINE SULFATE 2 MG/ML IJ SOLN
2.0000 mg | Freq: Once | INTRAMUSCULAR | Status: AC
  Administered 2011-03-16: 2 mg via INTRAVENOUS
  Filled 2011-03-16: qty 1

## 2011-03-16 NOTE — ED Notes (Signed)
Dr. Strand at bedside. 

## 2011-03-16 NOTE — ED Notes (Signed)
Patient awaiting MD evaluation.

## 2011-03-16 NOTE — ED Notes (Signed)
Patient states that he was scheduled for a CT scan last Friday to r/o obstruction.  He states when he was called by radiology prior to appointment, he informed staff member that he is allergic to IV contrast dye.  Staff member asked him if he was refusing to take contrast dye even if he were medicated.  Patient informed staff member that he was told by MD to never take it again.  Patient states he was then called and informed that CT scan had been cancelled.  Patient states he had a syncopal episode after vomiting this morning.  States he was walking out of bathroom and he remembers his legs were hurting.  Patient states he was awakened 1.5 hours later by his wife and c/o right arm pain and occipital pain.  Patient states he called Dr. Nash Dimmer office and was told by his nurse to come to ER for a CT scan.  Patient states the only reason he is here is to have CT scan and if he needs surgery, he requests to be transferred to [facility] under Dr. Eloise Harman care.

## 2011-03-16 NOTE — ED Notes (Signed)
Also has pain in right arm after the fall

## 2011-03-16 NOTE — ED Notes (Signed)
States he passed out this am at home

## 2011-03-17 MED ORDER — PEG 3350-KCL-NA BICARB-NACL 420 G PO SOLR
4000.0000 mL | Freq: Once | ORAL | Status: AC
  Administered 2011-03-17: 4000 mL via ORAL
  Filled 2011-03-17: qty 4000

## 2011-03-17 MED ORDER — POTASSIUM CHLORIDE CRYS ER 20 MEQ PO TBCR
40.0000 meq | EXTENDED_RELEASE_TABLET | Freq: Once | ORAL | Status: AC
  Administered 2011-03-17: 40 meq via ORAL
  Filled 2011-03-17: qty 2

## 2011-03-17 NOTE — ED Notes (Signed)
Patient states that pain has improved to 5/10

## 2011-03-17 NOTE — ED Notes (Signed)
Orders to place foley catheter received and initiated.

## 2011-03-17 NOTE — ED Notes (Signed)
Per Dr Colon Branch - GoLYTELY will be dispensed home with patient with specific instruction
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
## 2011-03-17 NOTE — ED Notes (Signed)
Returned from ct 

## 2011-03-17 NOTE — ED Notes (Signed)
Taken to ct via stretcher   

## 2011-03-17 NOTE — ED Notes (Signed)
Patient completed po contrast - ct staff made aware. Will continue to monitor patient.

## 2011-03-17 NOTE — ED Notes (Signed)
Patient left ER stating no needs

## 2011-03-19 ENCOUNTER — Other Ambulatory Visit: Payer: Self-pay | Admitting: Family Medicine

## 2011-03-19 DIAGNOSIS — G459 Transient cerebral ischemic attack, unspecified: Secondary | ICD-10-CM

## 2011-03-20 ENCOUNTER — Ambulatory Visit (HOSPITAL_COMMUNITY)
Admission: RE | Admit: 2011-03-20 | Discharge: 2011-03-20 | Disposition: A | Discharge status: Home / Self Care | Payer: Medicare Other | Source: Ambulatory Visit | Attending: Family Medicine | Admitting: Family Medicine

## 2011-03-20 DIAGNOSIS — R51 Headache: Secondary | ICD-10-CM | POA: Insufficient documentation

## 2011-03-20 DIAGNOSIS — R4789 Other speech disturbances: Secondary | ICD-10-CM | POA: Insufficient documentation

## 2011-03-20 DIAGNOSIS — R42 Dizziness and giddiness: Secondary | ICD-10-CM | POA: Insufficient documentation

## 2011-03-20 DIAGNOSIS — G459 Transient cerebral ischemic attack, unspecified: Secondary | ICD-10-CM

## 2011-03-20 DIAGNOSIS — G319 Degenerative disease of nervous system, unspecified: Secondary | ICD-10-CM | POA: Insufficient documentation

## 2011-04-07 ENCOUNTER — Encounter (INDEPENDENT_AMBULATORY_CARE_PROVIDER_SITE_OTHER): Payer: Self-pay

## 2011-04-08 ENCOUNTER — Ambulatory Visit (INDEPENDENT_AMBULATORY_CARE_PROVIDER_SITE_OTHER): Payer: Medicare Other | Admitting: Surgery

## 2011-04-08 ENCOUNTER — Encounter (INDEPENDENT_AMBULATORY_CARE_PROVIDER_SITE_OTHER): Payer: Self-pay | Admitting: Surgery

## 2011-04-08 DIAGNOSIS — R1032 Left lower quadrant pain: Secondary | ICD-10-CM

## 2011-04-08 NOTE — Progress Notes (Signed)
Visit Diagnoses: Abdominal pain  HISTORY: Lesion is a 60 year old white male well-known to my surgical practice. He has had extensive abdominal surgery. He has had chronic abdominal pain for many years. He was seen last month in consultation by my partner here at the office. He subsequently underwent CT scan of the abdomen and pelvis on March 20, 2011. This showed no evidence of intestinal obstruction. There was mild constipation. Gallbladder was surgically absent. No acute inflammation or other evidence of surgical problem.  Patient has been undergoing a neurologic workup for TIA and possible seizures. This is being directed by his primary care physician.  Patient returns today for followup regarding his chronic abdominal pain.   PERTINENT REVIEW OF SYSTEMS: Patient notes right mid abdominal pain radiating from the umbilicus laterally to the right. He notes a reducible incisional hernia. Patient notes irregular bowel habits having 2-3 bowel movements one day and no bowel movements for several days. He does take laxatives.   EXAM: HEENT: normocephalic; pupils equal and reactive; sclerae clear; dentition good; mucous membranes moist NECK:  symmetric on extension; no palpable anterior or posterior cervical lymphadenopathy; no supraclavicular masses; no tenderness CHEST: clear to auscultation bilaterally without rales, rhonchi, or wheezes CARDIAC: regular rate and rhythm without significant murmur; peripheral pulses are full ABDOMEN: Well healed surgical wounds. Skin integrity is good. Bowel sounds are present. Mild to moderate tenderness to palpation in the right midabdomen. Hernia defect is just to the right and slightly above the umbilicus. It measures approximately 2 cm in size and is spontaneously reducible. EXT:  non-tender without edema; no deformity NEURO: no gross focal deficits; no sign of tremor   IMPRESSION: #1 chronic abdominal pain, likely functional, possibly related to  adhesions from prior multiple extensive abdominal surgeries #2 small ventral incisional hernia, reducible   PLAN: At this time I think there is notable for surgical intervention. CT scan shows no sign of intestinal obstruction. Colonoscopy one year ago was essentially normal.  I believe a lot of his symptoms are functional and related to chronic constipation and laxative use. I do not think he would benefit from exploratory laparotomy and lysis of adhesions. Certainly this would be a high risk procedure and unlikely to provide long-term symptomatic relief. Also, his incisional hernia is small and essentially asymptomatic. I would not consider repair and last this became significantly more problematic.  Patient will continue followup with his primary care physician. He will complete his current neurologic evaluation. He may wish to consider followup with his gastroenterologist and review his current bowel regimen.  I will see the patient back in my practice as needed.   Velora Heckler, MD, FACS General & Endocrine Surgery [facility]

## 2011-04-27 ENCOUNTER — Encounter (INDEPENDENT_AMBULATORY_CARE_PROVIDER_SITE_OTHER): Payer: Self-pay | Admitting: Surgery

## 2011-05-07 ENCOUNTER — Ambulatory Visit (INDEPENDENT_AMBULATORY_CARE_PROVIDER_SITE_OTHER): Payer: Medicare Other | Admitting: Gastroenterology

## 2011-05-07 ENCOUNTER — Encounter: Payer: Self-pay | Admitting: Gastroenterology

## 2011-05-07 DIAGNOSIS — K5900 Constipation, unspecified: Secondary | ICD-10-CM

## 2011-05-07 DIAGNOSIS — K429 Umbilical hernia without obstruction or gangrene: Secondary | ICD-10-CM

## 2011-05-07 DIAGNOSIS — R1032 Left lower quadrant pain: Secondary | ICD-10-CM

## 2011-05-07 MED ORDER — LUBIPROSTONE 24 MCG PO CAPS: 24.0000 ug | ORAL_CAPSULE | Freq: Two times a day (BID) | ORAL | Status: DC

## 2011-05-07 NOTE — Assessment & Plan Note (Signed)
A reducible umbilical hernia is present. Would defer repair until constipation is resolved.

## 2011-05-07 NOTE — Assessment & Plan Note (Signed)
Abdominal pain is likely secondary to constipation.

## 2011-05-07 NOTE — Patient Instructions (Signed)
We are giving you samples of Amitiza to take twice a day for your constipation Call back to let us know how your doing Call and schedule a follow up appointment in 8-10 weeks

## 2011-05-07 NOTE — Assessment & Plan Note (Addendum)
Constipation is likely multifactorial including functional constipation exacerbated by chronic morphine use.  Recommendations #1 trial of Amitiza  24 mcg twice a day #2 if amitiza is unsuccessful then I will place him on lactulose; to consider lanclonide

## 2011-05-07 NOTE — Progress Notes (Signed)
History of Present Illness:  [name] has returned for evaluation of abdominal pain and constipation. Constipation has been a long-standing problem which he attributes to his daily morphine use. He's had xrays in the past that have shown a colon full of stool. Colonoscopy in October, 2011 was negative for polyps. Index polypectomy was 2001.  With constipation he gets abdominal fullness distention and discomfort. Despite taking MiraLax daily he may have a scatabulous stool. There is no bleeding.     Review of Systems: He suffers from chronic back pain. Pertinent positive and negative review of systems were noted in the above HPI section. All other review of systems were otherwise negative.    Current Medications, Allergies, Past Medical History, Past Surgical History, Family History and Social History were reviewed in [employer] electronic medical record  Vital signs were reviewed in today's medical record. Physical Exam: General: Well developed , well nourished, no acute distress Head: Normocephalic and atraumatic Eyes:  sclerae anicteric, EOMI Ears: Normal auditory acuity Mouth: No deformity or lesions Lungs: Clear throughout to auscultation Heart: Regular rate and rhythm; no murmurs, rubs or bruits Abdomen: Soft, non tender and non distended. No masses, hepatosplenomegaly Normal Bowel sounds. There is a 2 x 3 cm umbilical hernia that is reducible. Rectal:deferred Musculoskeletal: Symmetrical with no gross deformities  Pulses:  Normal pulses noted Extremities: No clubbing, cyanosis, edema or deformities noted Neurological: Alert oriented x 4, grossly nonfocal Psychological:  Alert and cooperative. Normal mood and affect

## 2011-05-19 ENCOUNTER — Telehealth: Payer: Self-pay | Admitting: Gastroenterology

## 2011-05-19 NOTE — Telephone Encounter (Signed)
Continue Amitiza 24 ug bid- this is the maximal dose. Add Lactulose 45 cc po qd or bid, 16 oz, 3 refills

## 2011-05-19 NOTE — Telephone Encounter (Signed)
Patient reports that Dr Arlyce Dice had started him on amitiza 24 mcg BID and he instructed him to hold Miralax.  He reports minimal results.  He states his stools are like ribbon and he is having a lot of gas and bloating.  Per Dr Marzetta Board last office note 05/07/11 he was going to consider lactulose if he did not adequately respond to [place].  Dr Juanda Chance you are MD of the day.  Please advise.

## 2011-05-20 MED ORDER — LACTULOSE 10 GM/15ML PO SOLN: ORAL | Status: DC

## 2011-05-20 NOTE — Telephone Encounter (Signed)
Patient advised.  He will call back for further problems

## 2011-05-22 ENCOUNTER — Other Ambulatory Visit: Payer: Self-pay | Admitting: Gastroenterology

## 2011-05-22 MED ORDER — LUBIPROSTONE 24 MCG PO CAPS: 24.0000 ug | ORAL_CAPSULE | Freq: Two times a day (BID) | ORAL | Status: DC

## 2011-05-22 NOTE — Telephone Encounter (Signed)
Let pt know that Amitiza was sent to your pharmacy

## 2011-06-08 ENCOUNTER — Encounter (INDEPENDENT_AMBULATORY_CARE_PROVIDER_SITE_OTHER): Payer: Self-pay | Admitting: Surgery

## 2011-06-16 ENCOUNTER — Telehealth: Payer: Self-pay | Admitting: Gastroenterology

## 2011-06-16 MED ORDER — LUBIPROSTONE 24 MCG PO CAPS: 24.0000 ug | ORAL_CAPSULE | Freq: Two times a day (BID) | ORAL | Status: DC

## 2011-06-16 NOTE — Telephone Encounter (Signed)
Spoke with pt and he states we were supposed to send a script to [facility] for Amitiza and it is not there. When he takes it along with the lactulose pt states it works pretty well. Rx sent to the pharmacy.

## 2011-06-22 ENCOUNTER — Telehealth: Payer: Self-pay | Admitting: Gastroenterology

## 2011-06-22 NOTE — Telephone Encounter (Signed)
Dr Arlyce Dice, This patient called today, stated that the Amitiza is too expensive and its not helping at all. Patient states he is not doing any better, he ate a little bit of salad and afterwards had bloating and nausea. He is having abdominal pain but not severe enough to have to go to the ER He is not constipated at all is passing bowel movements but he is having a lot of pain with bowel move ment in abdomen If any of the following occurs: Rectal bleeding N/V Fever Not wait for an appointment tomorrow if new symptoms occur  I have put pt on to see Dr Arlyce Dice on 06/23/2011 at 2:15am  Advised patient if symptoms worsen to go to the ER

## 2011-06-23 ENCOUNTER — Ambulatory Visit: Payer: Medicare Other | Admitting: Gastroenterology

## 2011-06-23 ENCOUNTER — Telehealth: Payer: Self-pay | Admitting: Gastroenterology

## 2011-06-23 NOTE — Telephone Encounter (Signed)
ok 

## 2011-06-23 NOTE — Telephone Encounter (Signed)
Pt states that his abdomen is not as distended as it was, he has gone to the bathroom about 9 times today. States his stool is not shaped like normal it is ribbon-like. He could not come for appt today because he had to keep running to the bathroom. Pt also states that his legs are swollen. One is red and warm to the touch. Instructed pt to call his PCP about his legs. Pt states he has called them and if leg not better tomorrow he is to call them back. Pt states he will keep his scheduled appt with Dr. Arlyce Dice for 06/26/11.

## 2011-06-26 ENCOUNTER — Ambulatory Visit: Payer: Medicare Other | Admitting: Gastroenterology

## 2011-07-03 ENCOUNTER — Encounter: Payer: Self-pay | Admitting: Gastroenterology

## 2011-07-03 ENCOUNTER — Ambulatory Visit (INDEPENDENT_AMBULATORY_CARE_PROVIDER_SITE_OTHER): Payer: Medicare Other | Admitting: Gastroenterology

## 2011-07-03 DIAGNOSIS — R600 Localized edema: Secondary | ICD-10-CM

## 2011-07-03 DIAGNOSIS — K5900 Constipation, unspecified: Secondary | ICD-10-CM

## 2011-07-03 DIAGNOSIS — R609 Edema, unspecified: Secondary | ICD-10-CM

## 2011-07-03 NOTE — Patient Instructions (Addendum)
Discontinue amikacin Change lactulose to 30 cc twice a day; you may increase it to 4 times a day as necessary Follow up in 6 weeks We have given you Linzess samples today

## 2011-07-03 NOTE — Assessment & Plan Note (Addendum)
Constipation is functional and exacerbated by medication. Therapy with amitiza and Eather Colas has been unsuccessful.  Recommendations #1 change lactulose to 30 cc 2-4 times a day #2 if lactulose is ineffective he will try Linzess 145 mcg daily

## 2011-07-03 NOTE — Progress Notes (Signed)
History of Present Illness:  Mr. Bracamonte has returned for reevaluation of obstipation. Amitiza did not help. He was switched to lactulose 45 cc daily. He is still straining at stools. He has a bowel movement every third day. At one point, with straining, he felt like he popped hernia in his abdomen and he has had anterior abdominal  pain since that time, especially when he sits up or moves around. He's also developed lower extremity edema and erythema and was placed on antibiotics.    Review of Systems: Pertinent positive and negative review of systems were noted in the above HPI section. All other review of systems were otherwise negative.    Current Medications, Allergies, Past Medical History, Past Surgical History, Family History and Social History were reviewed in [employer] electronic medical record  Vital signs were reviewed in today's medical record. Physical Exam: General: He is a depressed-appearing male On abdominal exam there is a small periumbilical hernia measuring about 2 cm x 2 cm. There is laxity of the rectus muscle but no other frank hernias. He has bilateral lower extremity 2-3+3 edema with mild, diffuse erythema

## 2011-07-03 NOTE — Assessment & Plan Note (Signed)
He has recently had a manipulation of his cardiac meds which could be causing edema. He is scheduled to have Doppler studies to rule out DVT. He also appears to have a low-grade cellulitis for which he is on antibiotics

## 2011-08-18 ENCOUNTER — Ambulatory Visit: Payer: Medicare Other | Admitting: Gastroenterology

## 2011-08-31 HISTORY — PX: NM MYOVIEW LTD: HXRAD82

## 2011-09-02 ENCOUNTER — Ambulatory Visit (INDEPENDENT_AMBULATORY_CARE_PROVIDER_SITE_OTHER): Payer: Medicare Other | Admitting: Surgery

## 2011-09-07 ENCOUNTER — Encounter (INDEPENDENT_AMBULATORY_CARE_PROVIDER_SITE_OTHER): Payer: Medicare Other | Admitting: Surgery

## 2011-09-21 ENCOUNTER — Encounter (INDEPENDENT_AMBULATORY_CARE_PROVIDER_SITE_OTHER): Payer: Self-pay | Admitting: Surgery

## 2011-09-21 ENCOUNTER — Ambulatory Visit (INDEPENDENT_AMBULATORY_CARE_PROVIDER_SITE_OTHER): Payer: Medicare Other | Admitting: Surgery

## 2011-09-21 DIAGNOSIS — K5909 Other constipation: Secondary | ICD-10-CM | POA: Insufficient documentation

## 2011-09-21 DIAGNOSIS — R109 Unspecified abdominal pain: Secondary | ICD-10-CM

## 2011-09-21 DIAGNOSIS — K432 Incisional hernia without obstruction or gangrene: Secondary | ICD-10-CM | POA: Insufficient documentation

## 2011-09-21 DIAGNOSIS — K5900 Constipation, unspecified: Secondary | ICD-10-CM

## 2011-09-21 NOTE — Progress Notes (Signed)
Visit Diagnoses: 1. Abdominal pain   2. Constipation, chronic   3. Incisional hernia     HISTORY: Patient is a 61 year old white male well-known to my surgical practice. Patient presents today with abdominal pain, intermittent, and ventral incisional hernia. Patient has chronic constipation due to underlying motility disorder. Motility problems are likely related to his multiple prior abdominal surgical procedures, previous vagotomy, and chronic narcotic requirements.  Patient had a recent episode of abdominal distention. This has now resolved. He presents for evaluation.  PERTINENT REVIEW OF SYSTEMS: Intermittent abdominal distention, chronic constipation, chronic abdominal pain  EXAM: HEENT: normocephalic; pupils equal and reactive; sclerae clear; dentition poor; mucous membranes moist NECK:  symmetric on extension; no palpable anterior or posterior cervical lymphadenopathy; no supraclavicular masses; no tenderness CHEST: clear to auscultation bilaterally without rales, rhonchi, or wheezes CARDIAC: regular rate and rhythm without significant murmur; peripheral pulses are full ABDOMEN: Soft with mild distention. Bowel sounds are present. Well-healed surgical incisions. Small ulcerated lesion midline incision measuring less than 5 mm in diameter. There is a small hernia just above and to the right of the umbilicus. This contains omentum. It isn't reducible. There is a moderate rectus diastases with setup maneuver. EXT:  non-tender without edema; no deformity    IMPRESSION: #1 chronic constipation secondary to poor motility, multifactorial #2 ventral incisional hernia, reducible  PLAN: At this point there is no for acute surgical intervention. Certainly the patient is at high risk for any operative procedure. I have again recommended that he maintain a regular bowel regimen with laxatives, diet, and fiber supplements in an attempt to relieve his chronic constipation.  The patient  understands the above issues. He will return to see me as needed.  Velora Heckler, MD, FACS General & Endocrine Surgery [facility]

## 2011-11-25 ENCOUNTER — Encounter (HOSPITAL_COMMUNITY): Payer: Self-pay | Admitting: *Deleted

## 2011-11-25 ENCOUNTER — Emergency Department (HOSPITAL_COMMUNITY): Payer: Medicare Other

## 2011-11-25 ENCOUNTER — Inpatient Hospital Stay (HOSPITAL_COMMUNITY)
Admission: EM | Admit: 2011-11-25 | Discharge: 2011-11-26 | DRG: 287 | Disposition: A | Discharge status: Home / Self Care | Payer: Medicare Other | Attending: Internal Medicine | Admitting: Internal Medicine

## 2011-11-25 DIAGNOSIS — Z823 Family history of stroke: Secondary | ICD-10-CM

## 2011-11-25 DIAGNOSIS — Z8673 Personal history of transient ischemic attack (TIA), and cerebral infarction without residual deficits: Secondary | ICD-10-CM

## 2011-11-25 DIAGNOSIS — E669 Obesity, unspecified: Secondary | ICD-10-CM | POA: Diagnosis present

## 2011-11-25 DIAGNOSIS — I10 Essential (primary) hypertension: Secondary | ICD-10-CM | POA: Diagnosis present

## 2011-11-25 DIAGNOSIS — K263 Acute duodenal ulcer without hemorrhage or perforation: Secondary | ICD-10-CM | POA: Diagnosis present

## 2011-11-25 DIAGNOSIS — M1990 Unspecified osteoarthritis, unspecified site: Secondary | ICD-10-CM | POA: Diagnosis present

## 2011-11-25 DIAGNOSIS — I200 Unstable angina: Secondary | ICD-10-CM | POA: Diagnosis present

## 2011-11-25 DIAGNOSIS — Z8249 Family history of ischemic heart disease and other diseases of the circulatory system: Secondary | ICD-10-CM

## 2011-11-25 DIAGNOSIS — K219 Gastro-esophageal reflux disease without esophagitis: Secondary | ICD-10-CM | POA: Diagnosis present

## 2011-11-25 DIAGNOSIS — E118 Type 2 diabetes mellitus with unspecified complications: Secondary | ICD-10-CM | POA: Diagnosis present

## 2011-11-25 DIAGNOSIS — Z79899 Other long term (current) drug therapy: Secondary | ICD-10-CM

## 2011-11-25 DIAGNOSIS — G609 Hereditary and idiopathic neuropathy, unspecified: Secondary | ICD-10-CM | POA: Diagnosis present

## 2011-11-25 DIAGNOSIS — Z91041 Radiographic dye allergy status: Secondary | ICD-10-CM

## 2011-11-25 DIAGNOSIS — E782 Mixed hyperlipidemia: Secondary | ICD-10-CM | POA: Diagnosis present

## 2011-11-25 DIAGNOSIS — G8929 Other chronic pain: Secondary | ICD-10-CM | POA: Diagnosis present

## 2011-11-25 DIAGNOSIS — J441 Chronic obstructive pulmonary disease with (acute) exacerbation: Secondary | ICD-10-CM | POA: Diagnosis present

## 2011-11-25 DIAGNOSIS — K5909 Other constipation: Secondary | ICD-10-CM | POA: Diagnosis present

## 2011-11-25 DIAGNOSIS — Z886 Allergy status to analgesic agent status: Secondary | ICD-10-CM

## 2011-11-25 DIAGNOSIS — Z96659 Presence of unspecified artificial knee joint: Secondary | ICD-10-CM

## 2011-11-25 DIAGNOSIS — Z9189 Other specified personal risk factors, not elsewhere classified: Secondary | ICD-10-CM

## 2011-11-25 DIAGNOSIS — Z888 Allergy status to other drugs, medicaments and biological substances status: Secondary | ICD-10-CM

## 2011-11-25 DIAGNOSIS — E119 Type 2 diabetes mellitus without complications: Secondary | ICD-10-CM | POA: Diagnosis present

## 2011-11-25 DIAGNOSIS — M549 Dorsalgia, unspecified: Secondary | ICD-10-CM | POA: Diagnosis present

## 2011-11-25 DIAGNOSIS — E1169 Type 2 diabetes mellitus with other specified complication: Secondary | ICD-10-CM | POA: Diagnosis present

## 2011-11-25 DIAGNOSIS — F3289 Other specified depressive episodes: Secondary | ICD-10-CM | POA: Diagnosis present

## 2011-11-25 DIAGNOSIS — K5900 Constipation, unspecified: Secondary | ICD-10-CM | POA: Diagnosis present

## 2011-11-25 DIAGNOSIS — R0789 Other chest pain: Principal | ICD-10-CM | POA: Diagnosis present

## 2011-11-25 DIAGNOSIS — G9009 Other idiopathic peripheral autonomic neuropathy: Secondary | ICD-10-CM | POA: Diagnosis present

## 2011-11-25 DIAGNOSIS — Z7982 Long term (current) use of aspirin: Secondary | ICD-10-CM

## 2011-11-25 DIAGNOSIS — I110 Hypertensive heart disease with heart failure: Secondary | ICD-10-CM | POA: Diagnosis present

## 2011-11-25 DIAGNOSIS — E785 Hyperlipidemia, unspecified: Secondary | ICD-10-CM | POA: Diagnosis present

## 2011-11-25 DIAGNOSIS — Z86718 Personal history of other venous thrombosis and embolism: Secondary | ICD-10-CM

## 2011-11-25 DIAGNOSIS — F329 Major depressive disorder, single episode, unspecified: Secondary | ICD-10-CM | POA: Diagnosis present

## 2011-11-25 HISTORY — DX: Other chronic pain: G89.29

## 2011-11-25 HISTORY — DX: Calculus of kidney: N20.0

## 2011-11-25 HISTORY — DX: Family history of ischemic heart disease and other diseases of the circulatory system: Z82.49

## 2011-11-25 HISTORY — DX: Dorsalgia, unspecified: M54.9

## 2011-11-25 LAB — HEPATIC FUNCTION PANEL
ALT: 26 U/L (ref 0–53)
AST: 22 U/L (ref 0–37)
Bilirubin, Direct: 0.2 mg/dL (ref 0.0–0.3)
Indirect Bilirubin: 0.9 mg/dL (ref 0.3–0.9)
Total Bilirubin: 1.1 mg/dL (ref 0.3–1.2)

## 2011-11-25 LAB — BASIC METABOLIC PANEL
BUN: 16 mg/dL (ref 6–23)
CO2: 24 mEq/L (ref 19–32)
Glucose, Bld: 106 mg/dL — ABNORMAL HIGH (ref 70–99)
Potassium: 3.9 mEq/L (ref 3.5–5.1)
Sodium: 137 mEq/L (ref 135–145)

## 2011-11-25 LAB — CBC WITH DIFFERENTIAL/PLATELET
Hemoglobin: 14.5 g/dL (ref 13.0–17.0)
Lymphocytes Relative: 40 % (ref 12–46)
Lymphs Abs: 2 10*3/uL (ref 0.7–4.0)
MCV: 85 fL (ref 78.0–100.0)
Monocytes Relative: 8 % (ref 3–12)
Neutrophils Relative %: 49 % (ref 43–77)
Platelets: 303 10*3/uL (ref 150–400)
RBC: 4.92 MIL/uL (ref 4.22–5.81)
WBC: 5 10*3/uL (ref 4.0–10.5)

## 2011-11-25 LAB — URINALYSIS, ROUTINE W REFLEX MICROSCOPIC
Bilirubin Urine: NEGATIVE
Glucose, UA: NEGATIVE mg/dL
Hgb urine dipstick: NEGATIVE
Specific Gravity, Urine: 1.017 (ref 1.005–1.030)

## 2011-11-25 LAB — CARDIAC PANEL(CRET KIN+CKTOT+MB+TROPI)
Relative Index: 4.2 — ABNORMAL HIGH (ref 0.0–2.5)
Troponin I: <0.3 ng/mL (ref <0.30)

## 2011-11-25 LAB — PROTIME-INR
INR: 1.04 (ref 0.00–1.49)
Prothrombin Time: 13.8 seconds (ref 11.6–15.2)

## 2011-11-25 LAB — TROPONIN I: Troponin I: <0.3 ng/mL (ref <0.30)

## 2011-11-25 MED ORDER — NITROGLYCERIN 0.4 MG SL SUBL
0.4000 mg | SUBLINGUAL_TABLET | SUBLINGUAL | Status: AC | PRN
  Administered 2011-11-25 (×3): 0.4 mg via SUBLINGUAL
  Filled 2011-11-25: qty 25

## 2011-11-25 MED ORDER — LOSARTAN POTASSIUM 50 MG PO TABS
100.0000 mg | ORAL_TABLET | Freq: Every day | ORAL | Status: DC
  Administered 2011-11-26: 50 mg via ORAL
  Filled 2011-11-25 (×2): qty 2

## 2011-11-25 MED ORDER — SODIUM CHLORIDE 0.9 % IJ SOLN: 3.0000 mL | INTRAMUSCULAR | Status: DC | PRN

## 2011-11-25 MED ORDER — SODIUM CHLORIDE 0.9 % IV SOLN: 250.0000 mL | INTRAVENOUS | Status: DC | PRN

## 2011-11-25 MED ORDER — NITROGLYCERIN 0.4 MG SL SUBL: 0.4000 mg | SUBLINGUAL_TABLET | SUBLINGUAL | Status: DC | PRN

## 2011-11-25 MED ORDER — MORPHINE SULFATE 4 MG/ML IJ SOLN
INTRAMUSCULAR | Status: AC
  Administered 2011-11-25: 3 mg via INTRAVENOUS
  Filled 2011-11-25: qty 1

## 2011-11-25 MED ORDER — HYOSCYAMINE SULFATE ER 0.375 MG PO TB12
0.3750 mg | ORAL_TABLET | Freq: Two times a day (BID) | ORAL | Status: DC | PRN
  Filled 2011-11-25: qty 1

## 2011-11-25 MED ORDER — PROMETHAZINE HCL 25 MG PO TABS
25.0000 mg | ORAL_TABLET | Freq: Four times a day (QID) | ORAL | Status: DC | PRN
  Administered 2011-11-26: 25 mg via ORAL
  Filled 2011-11-25: qty 1

## 2011-11-25 MED ORDER — HEPARIN (PORCINE) IN NACL 100-0.45 UNIT/ML-% IJ SOLN
1500.0000 [IU]/h | INTRAMUSCULAR | Status: DC
  Administered 2011-11-25: 1300 [IU]/h via INTRAVENOUS
  Administered 2011-11-26: 1500 [IU]/h via INTRAVENOUS
  Filled 2011-11-25 (×2): qty 250

## 2011-11-25 MED ORDER — ONDANSETRON HCL 4 MG/2ML IJ SOLN
4.0000 mg | Freq: Four times a day (QID) | INTRAMUSCULAR | Status: DC | PRN
  Administered 2011-11-25 – 2011-11-26 (×2): 4 mg via INTRAVENOUS
  Filled 2011-11-25 (×2): qty 2

## 2011-11-25 MED ORDER — MORPHINE SULFATE 30 MG PO TABS
30.0000 mg | ORAL_TABLET | ORAL | Status: DC | PRN
  Administered 2011-11-25 – 2011-11-26 (×3): 30 mg via ORAL
  Filled 2011-11-25 (×3): qty 2

## 2011-11-25 MED ORDER — SENNOSIDES 8.6 MG PO TABS: 1.0000 | ORAL_TABLET | Freq: Every day | ORAL | Status: DC

## 2011-11-25 MED ORDER — HEPARIN BOLUS VIA INFUSION
4000.0000 [IU] | Freq: Once | INTRAVENOUS | Status: AC
  Administered 2011-11-25: 4000 [IU] via INTRAVENOUS

## 2011-11-25 MED ORDER — INSULIN ASPART 100 UNIT/ML ~~LOC~~ SOLN
0.0000 [IU] | Freq: Three times a day (TID) | SUBCUTANEOUS | Status: DC
  Administered 2011-11-26: 1 [IU] via SUBCUTANEOUS

## 2011-11-25 MED ORDER — MORPHINE SULFATE 4 MG/ML IJ SOLN
3.0000 mg | Freq: Once | INTRAMUSCULAR | Status: AC
  Administered 2011-11-25: 3 mg via INTRAVENOUS

## 2011-11-25 MED ORDER — LACTULOSE 10 GM/15ML PO SOLN
30.0000 g | Freq: Every day | ORAL | Status: DC
  Administered 2011-11-25: 30 g via ORAL
  Filled 2011-11-25 (×2): qty 45

## 2011-11-25 MED ORDER — NITROGLYCERIN IN D5W 200-5 MCG/ML-% IV SOLN
5.0000 ug/min | INTRAVENOUS | Status: DC
  Administered 2011-11-25: 5 ug/min via INTRAVENOUS
  Filled 2011-11-25: qty 250

## 2011-11-25 MED ORDER — POTASSIUM CHLORIDE CRYS ER 20 MEQ PO TBCR
20.0000 meq | EXTENDED_RELEASE_TABLET | Freq: Every day | ORAL | Status: DC
  Administered 2011-11-25: 20 meq via ORAL
  Filled 2011-11-25 (×2): qty 1

## 2011-11-25 MED ORDER — ALBUTEROL SULFATE HFA 108 (90 BASE) MCG/ACT IN AERS
2.0000 | INHALATION_SPRAY | Freq: Four times a day (QID) | RESPIRATORY_TRACT | Status: DC | PRN
  Filled 2011-11-25: qty 6.7

## 2011-11-25 MED ORDER — SENNA 8.6 MG PO TABS
1.0000 | ORAL_TABLET | Freq: Every day | ORAL | Status: DC
  Administered 2011-11-26: 8.6 mg via ORAL
  Filled 2011-11-25 (×2): qty 1

## 2011-11-25 MED ORDER — LOSARTAN POTASSIUM 50 MG PO TABS: 100.0000 mg | ORAL_TABLET | Freq: Every day | ORAL | Status: DC

## 2011-11-25 MED ORDER — ASPIRIN 81 MG PO CHEW
162.0000 mg | CHEWABLE_TABLET | ORAL | Status: AC
  Administered 2011-11-26: 162 mg via ORAL

## 2011-11-25 MED ORDER — FUROSEMIDE 40 MG PO TABS
40.0000 mg | ORAL_TABLET | Freq: Two times a day (BID) | ORAL | Status: DC
  Administered 2011-11-25 – 2011-11-26 (×2): 40 mg via ORAL
  Filled 2011-11-25 (×4): qty 1

## 2011-11-25 MED ORDER — AMLODIPINE BESYLATE 5 MG PO TABS
5.0000 mg | ORAL_TABLET | Freq: Every day | ORAL | Status: DC
  Administered 2011-11-25 – 2011-11-26 (×2): 5 mg via ORAL
  Filled 2011-11-25 (×2): qty 1

## 2011-11-25 MED ORDER — POLYETHYLENE GLYCOL 3350 17 GM/SCOOP PO POWD
17.0000 g | Freq: Every day | ORAL | Status: DC
  Administered 2011-11-25: 17 g via ORAL
  Filled 2011-11-25: qty 255

## 2011-11-25 MED ORDER — ACETAMINOPHEN 325 MG PO TABS
650.0000 mg | ORAL_TABLET | ORAL | Status: DC | PRN
  Administered 2011-11-26: 650 mg via ORAL
  Filled 2011-11-25: qty 2

## 2011-11-25 MED ORDER — SODIUM CHLORIDE 0.9 % IV SOLN
INTRAVENOUS | Status: DC
  Administered 2011-11-25: 20:00:00 via INTRAVENOUS

## 2011-11-25 MED ORDER — ASPIRIN 81 MG PO CHEW
81.0000 mg | CHEWABLE_TABLET | Freq: Every day | ORAL | Status: DC
  Administered 2011-11-25: 81 mg via ORAL
  Filled 2011-11-25: qty 1
  Filled 2011-11-25: qty 2
  Filled 2011-11-25: qty 1

## 2011-11-25 MED ORDER — SODIUM CHLORIDE 0.9 % IJ SOLN: 3.0000 mL | Freq: Two times a day (BID) | INTRAMUSCULAR | Status: DC

## 2011-11-25 MED ORDER — ALPRAZOLAM 0.5 MG PO TABS
1.0000 mg | ORAL_TABLET | Freq: Four times a day (QID) | ORAL | Status: DC
  Administered 2011-11-25 – 2011-11-26 (×4): 1 mg via ORAL
  Filled 2011-11-25: qty 1
  Filled 2011-11-25 (×2): qty 2
  Filled 2011-11-25: qty 1
  Filled 2011-11-25: qty 2

## 2011-11-25 MED ORDER — DIAZEPAM 5 MG PO TABS: 5.0000 mg | ORAL_TABLET | ORAL | Status: DC

## 2011-11-25 MED ORDER — SODIUM CHLORIDE 0.9 % IV SOLN
INTRAVENOUS | Status: DC
  Administered 2011-11-26: 04:00:00 via INTRAVENOUS

## 2011-11-25 NOTE — ED Notes (Signed)
Pt. States "when I sit up it feels like someone put a knife in my chest".

## 2011-11-25 NOTE — ED Notes (Signed)
Pt. States "pain/pressure is 4/10. Rate changed to .

## 2011-11-25 NOTE — H&P (Signed)
[name] is an 61 y.o. male.   Chief Complaint: chest pain HPI: 61 year old white married male patient of Dr. Bryan Lemma and Dr. Modesto Charon at [facility] Presents to the emergency room per the recommendation of both his primary care and our office Secondary to increasing episodes of chest pain.  He states his pain started 3 or 4 days ago when coming down sometimes it feels that she has a 300 pound elephant sitting on his left anterior chest it is associated with sureness of breath nausea and vomiting. He has a [name] of her cardiac catheterization in 2002 with normal coronary arteries. This was done for chest pain and strong family [name] of coronary disease including premature coronary artery disease. Patient Helex scan Myoview in 2011 there was no ischemia EF was 55%.  Last TD at 10 was December 2012 EF was 50-55% mild concentric left ventricular hypertrophy and impaired LV relaxation. The LA was moderately dilated. He also had borderline aortic root dilatation.  [name] is quite complex with multiple comorbidities. He has had a [name] of DVT but most recent venous Doppler of her lower extremity edema with negative, his also had renal ultrasound which was negative for renal stenosis. He does have labile hypertension. He has had a [name] of TIAs and more recently episodes of memory loss which improved after stopping amitriptyline. He also has flashbacks from his [employer] days has had exposure to agent orange has asthma and osteoarthritis that is quite severe And that he is followed by Dr. Vear Clock in the pain clinic.  He has had multiple GI surgeries including Billroth II, small bowel obstructions, scars he has a ventral hernia followed by Dr. Gerrit Friends who at this time does not want to do surgery.  For GI physician he had been seeing Dr. Arlyce Dice, but the patient tells [name] no longer is followed by Dr. Arlyce Dice.  Patient has chronic constipation and is on  lactulose.  Initial markers here in that ER were negative waiting for follow marker. EKG with poor R wave progression in leads V2.and V3.  Continues with pain increased pressure in left anterior chest and then he with movement will have sharp pain. I cannot recreate the pain by pressing on his chest.  He also has multiple allergies and intolerances.  Bystolic was thought to cause increased flashbacks and nightmares he is currently not on a beta blocker because of this.  He can take a baby aspirin under his time and it resolves and it does not cause GI pain taking it this way.  Past Medical [name]  Diagnosis Date  . Diabetes mellitus   . Hyperlipidemia   . Hypertension   . GERD (gastroesophageal reflux disease)   . Asthma   . Stroke   . Colon polyps   . Anemia   . Depression   . Arthritis   . Clotting disorder   . COPD (chronic obstructive pulmonary disease)   . Neuromuscular disorder     Agent orange   . Hearing loss   . Wheezing   . Leg swelling   . Abdominal pain   . Abdominal distention   . Nausea and vomiting   . Diarrhea   . Constipation   . Fainting   . Weakness   . Skin rash   . Small bowel obstruction 2001  . Seizure   . Right shoulder injury   . Left knee injury   . Unstable angina 11/25/2011  . Back  pain, chronic, followed at pain clinic 11/25/2011  . Family [name] of acute myocardial infarction. and premature CAD 11/25/2011  . Nephrolithiasis     Past Surgical [name]  Procedure Date  . Joint replacement     right knee replaced 2x  . Colonoscopy   . Exploratory laparotomy with extensive lysis of adhesions 10/1999  . Small bowel obstruction 1996  . Partial small bowel obstruction 1999  . Cardiac catheterization     Family [name]  Problem Relation Age of Onset  . Stroke Mother   . Heart disease Mother   . Heart attack Mother   . Heart disease Father   . Heart attack Father   . Heart attack Sister   . Heart attack Brother   . Hypertension Brother    . Heart attack Son   . Stroke Son   . Hypertension Brother   . Sleep apnea Son    Social [name]:  reports that he has never smoked. He has never used smokeless tobacco. He reports that he drinks alcohol. He reports that he does not use illicit drugs.  Allergies:  Allergies  Allergen Reactions  . Ivp Dye (Iodinated Diagnostic Agents)     Omni - Paque Contrast   . Aspirin   . Atorvastatin     REACTION: myalgias  . Butorphanol Tartrate   . Bystolic (Nebivolol Hcl)     Nightmares, flashbacks  . Codeine   . Dilaudid (Hydromorphone Hcl)   . Doxycycline   . Iohexol      Desc: hives,neck and torso erythemia   . Latex   . Methocarbamol     rash  . Morphine And Related     Pt is on out patient morphine not allergic to morphine  . Oxycodone Hcl   . Pentazocine Lactate   . Pentazocine Lactate     rash  . Succinylcholine Chloride   . Synvisc (Hylan G-F 20)     rash  . Dilaudid (Hydromorphone Hcl) Nausea Only and Rash    Aggressive     Outpatient medications: Albuterol inhaler 2 puffs every 6 hours. Alprazolam 1 mg every 6 hours when necessary Amlodipine 5 mg daily (when he was on 10 mg daily had significant lower extremity edema) Aspirin 81 mg daily chewable that he does also addressed Lasix 40 mg he takes twice a day that he can increase it to 3 times a day if needed for weight gain Lactulose 45 mL twice a day Levibid 0.375 mg when necessary for stomach cramps Morphine sulfate tablets 30 mg every 4-6 hours when necessary for back pain MiraLAX daily Potassium 20 mEq one daily that he will take 2 if he increases his dose of furosemide Phenergan 25 mg orally every 6-8 hours when necessary for nausea Proair  inhaler when necessary     Results for orders placed during the hospital encounter of 11/25/11 (from the past 48 hour(s))  CBC WITH DIFFERENTIAL     Status: Normal   Collection Time   11/25/11 12:51 PM      Component Value Range Comment   WBC 5.0  4.0 - 10.5 K/uL     RBC 4.92  4.22 - 5.81 MIL/uL    Hemoglobin 14.5  13.0 - 17.0 g/dL    HCT 16.1  09.6 - 04.5 %    MCV 85.0  78.0 - 100.0 fL    MCH 29.5  26.0 - 34.0 pg    MCHC 34.7  30.0 - 36.0 g/dL    RDW  13.3  11.5 - 15.5 %    Platelets 303  150 - 400 K/uL    Neutrophils Relative 49  43 - 77 %    Neutro Abs 2.4  1.7 - 7.7 K/uL    Lymphocytes Relative 40  12 - 46 %    Lymphs Abs 2.0  0.7 - 4.0 K/uL    Monocytes Relative 8  3 - 12 %    Monocytes Absolute 0.4  0.1 - 1.0 K/uL    Eosinophils Relative 3  0 - 5 %    Eosinophils Absolute 0.1  0.0 - 0.7 K/uL    Basophils Relative 0  0 - 1 %    Basophils Absolute 0.0  0.0 - 0.1 K/uL   BASIC METABOLIC PANEL     Status: Abnormal   Collection Time   11/25/11 12:51 PM      Component Value Range Comment   Sodium 137  135 - 145 mEq/L    Potassium 3.9  3.5 - 5.1 mEq/L    Chloride 99  96 - 112 mEq/L    CO2 24  19 - 32 mEq/L    Glucose, Bld 106 (*) 70 - 99 mg/dL    BUN 16  6 - 23 mg/dL    Creatinine, Ser 1.61  0.50 - 1.35 mg/dL    Calcium 09.6  8.4 - 10.5 mg/dL    GFR calc non Af Amer >90  >90 mL/min    GFR calc Af Amer >90  >90 mL/min   TROPONIN I     Status: Normal   Collection Time   11/25/11  1:21 PM      Component Value Range Comment   Troponin I <0.30  <0.30 ng/mL    Dg Chest 2 View  11/25/2011  *RADIOLOGY REPORT*  Clinical Data: Chest pain and chest pressure.  CHEST - 2 VIEW  Comparison: Chest x-ray with 04/10/2009.  Findings: Lung volumes are normal.  No consolidative airspace disease.  No pleural effusions.  No pneumothorax.  No pulmonary nodule or mass noted.  Pulmonary vasculature and the cardiomediastinal silhouette are within normal limits.  Surgical clips near the gastroesophageal junction (unchanged).  Multiple tiny calcified granulomas in the right lung are again noted.  IMPRESSION: 1. No radiographic evidence of acute cardiopulmonary disease. . 2.  Multiple small calcified granulomas in the right lung.  Original Report Authenticated By: Florencia Reasons, M.D.    ROS: General:No colds or fevers chronic back pain continues and chest pain as described in [name] of present illness Skin:No rashes or ulcers HEENT:No blurred vision or double vision CV:See [name] of present illness EAV:[id number] of asthma JY:NWGNFAO GI issues including hernia, [name] is Billroth II, chronic constipation GU:No hematuria or dysuria ZH:[id number] back pain he has had his right knee replaced twice his left knee needs replacement and is on by mouth morphine when necessary Neuro:No syncope or lightheadedness but does have episodes of memory loss and flashbacks or which point he may stand still and stare for 15-20 minutes at a time he has been evaluated by neurology his EEG was abnormal Endo:He is diabetic diet controlled   Blood pressure 139/80, pulse 74, temperature 98.2 F (36.8 C), temperature source Oral, resp. rate 14, SpO2 100.00%. PE: General:Alert and oriented x3 pleasant affect still having chest pain Skin:Warm and dry brisk.  HEENT:Normocephalic, sclera clear Neck:Supple no JVD no carotid bruits Heart:Regular rate and rhythm without murmur gallop or click, I cannot recreate chest wall pain Lungs:Clear without rales or  wheezes ZOX:WRUEA soft nontender positive bowel sounds do not palpate liver spleen or mass Ext:No edema 2+ pedal pulses bilaterally 2+ radial pulses bilaterally Neuro:Alert and oriented x3 follows commands moves all extremities    Assessment/Plan Patient Active Problem List  Diagnosis  . DIABETES MELLITUS, TYPE II  . GLUCOSE INTOLERANCE  . HYPERLIPIDEMIA  . PERIPHERAL NEUROPATHY  . HYPERTENSION  . CLAUDICATION  . ALLERGIC RHINITIS  . ASTHMA, WITH ACUTE EXACERBATION  . PULMONARY NODULE  . ESOPHAGEAL STRICTURE  . GERD  . ACUT DUOD ULCER W/O MENTION HEMORR PERF/OBST  . ISCHEMIC COLITIS  . SBO  . DIVERTICULOSIS, COLON  . CONSTIPATION  . OSTEOARTHRITIS  . DEGENERATIVE JOINT DISEASE, LEFT KNEE  . OTHER SLEEP DISTURBANCES   . COUGH  . NAUSEA AND VOMITING  . Abdominal pain, chronic, left lower quadrant  . HIP THI LEG&ANK ABRASION/FRICION BURN W/O INF  . CEREBROVASCULAR ACCIDENT, HX OF  . COLONIC POLYPS, HX OF  . NEPHROLITHIASIS, HX OF  . Umbilical hernia  . Edema extremities  . Abdominal pain  . Constipation, chronic  . Incisional hernia  . Unstable angina  . Back pain, chronic, followed at pain clinic  . Family [name] of acute myocardial infarction. and premature CAD   PLAN: Patient admitted with increasing episodes of chest pain associated with shortness of breath and nausea and vomiting. He has a [name] of a normal cath in 2002 and normal mixed fatty in 2011. He also has a very strong family [name] of coronary artery disease with both parents died with coronary disease on brothers with severe coronary disease one sister died with a subdural hematoma and an MI. He had a 34 year old son That was injured in [place] with AED, and gunshots, That died of a massive MI.  The patient continues with pain now, will give IV morphine, IV nitroglycerin which is started and IV heparin. We'll follow serial cardiac enzymes and EKG. Plan we'll proceed with either cardiac catheterization versus nuclear stress test  Tomorrow.  Will admit to step down for close monitoring.  Kamla Skilton R 11/25/2011, 4:40 PM

## 2011-11-25 NOTE — ED Notes (Signed)
Pt. Reports chest pressure has lessened. 3/10. Reports Nausea and dizziness yesterday, non right now. A. O. X 4 NAD. Family at bedside.

## 2011-11-25 NOTE — Progress Notes (Signed)
ANTICOAGULATION CONSULT NOTE - Initial Consult  Pharmacy Consult for Heparin Indication: chest pain/ACS  Allergies  Allergen Reactions  . Ivp Dye (Iodinated Diagnostic Agents)     Omni - Paque Contrast   . Aspirin   . Atorvastatin     REACTION: myalgias  . Butorphanol Tartrate   . Bystolic (Nebivolol Hcl)     Nightmares, flashbacks  . Codeine   . Dilaudid (Hydromorphone Hcl)   . Doxycycline   . Iohexol      Desc: hives,neck and torso erythemia   . Latex   . Methocarbamol     rash  . Morphine And Related     Pt is on out patient morphine not allergic to morphine  . Oxycodone Hcl   . Pentazocine Lactate   . Pentazocine Lactate     rash  . Succinylcholine Chloride   . Synvisc (Hylan G-F 20)     rash  . Dilaudid (Hydromorphone Hcl) Nausea Only and Rash    Aggressive     Patient Measurements:   Heparin Dosing Weight: 104.5 kg  Vital Signs: Temp: 98.2 F (36.8 C) (06/26 1207) Temp src: Oral (06/26 1207) BP: 139/80 mmHg (06/26 1608) Pulse Rate: 74  (06/26 1608)  Labs:  Basename 11/25/11 1321 11/25/11 1251  HGB -- 14.5  HCT -- 41.8  PLT -- 303  APTT -- --  LABPROT -- --  INR -- --  HEPARINUNFRC -- --  CREATININE -- 0.78  CKTOTAL -- --  CKMB -- --  TROPONINI <0.30 --    The CrCl is unknown because both a height and weight (above a minimum accepted value) are required for this calculation.   Medical History: Past Medical History  Diagnosis Date  . Diabetes mellitus   . Hyperlipidemia   . Hypertension   . GERD (gastroesophageal reflux disease)   . Asthma   . Stroke   . Colon polyps   . Anemia   . Depression   . Arthritis   . Clotting disorder   . COPD (chronic obstructive pulmonary disease)   . Neuromuscular disorder     Agent orange   . Hearing loss   . Wheezing   . Leg swelling   . Abdominal pain   . Abdominal distention   . Nausea and vomiting   . Diarrhea   . Constipation   . Fainting   . Weakness   . Skin rash   . Small bowel  obstruction 2001  . Seizure   . Right shoulder injury   . Left knee injury   . Unstable angina 11/25/2011  . Back pain, chronic, followed at pain clinic 11/25/2011  . Family history of acute myocardial infarction. and premature CAD 11/25/2011  . Nephrolithiasis      Assessment: 61 yo male who presented with chest pain to start heparin infusion. CBC ok with Hgb of 14.5 and plt count of 303. Plan for possible cardiac cath vs nuclear stress test tomorrow.   Goal of Therapy:  Heparin level 0.3-0.7 units/ml Monitor platelets by anticoagulation protocol: Yes   Plan:  Give 4000 units bolus x 1 Start heparin infusion at 1300 units/hr Check anti-Xa level in 6 hours and daily while on heparin Continue to monitor H&H and platelets  Concha Norway 11/25/2011,4:56 PM

## 2011-11-25 NOTE — ED Provider Notes (Signed)
History     CSN: [id number]  Arrival date & time 11/25/11  1205   First MD Initiated Contact with Patient 11/25/11 1247      Chief Complaint  Patient presents with  . Chest Pain    (Consider location/radiation/quality/duration/timing/severity/associated sxs/prior treatment) HPI  61 year old male with multiple comorbidities including diabetes, hypertension, and hyperlipidemia presents complaining of chest pain. Patient states for the past 4 or 5 days he has had intermittent pain to his left chest. He described pain as a pressure sensation that is nonradiating in his left upper chest. Symptoms symptoms worsen with exertion but recently has been constant. He also endorsed associated soreness of breath and occasional nonproductive cough. Yesterday he had a bout of diaphoresis while having chest pain. He has discussed with his heart doctor who recommended him to come to ER for further evaluation. Patient reports that his last cardiac catheterization has been greater than 5 years ago. He denies any prior history of MI.  Patient also reports having multiple comorbidities with chronic pain to both of his knees which he takes regular narcotic pain medication for it. Otherwise he denies any significant changes. He is a nonsmoker.  Past Medical History  Diagnosis Date  . Diabetes mellitus   . Hyperlipidemia   . Hypertension   . GERD (gastroesophageal reflux disease)   . Asthma   . Stroke   . Colon polyps   . Anemia   . Depression   . Arthritis   . Clotting disorder   . COPD (chronic obstructive pulmonary disease)   . Neuromuscular disorder     Agent orange   . Hearing loss   . Wheezing   . Leg swelling   . Abdominal pain   . Abdominal distention   . Nausea and vomiting   . Diarrhea   . Constipation   . Fainting   . Weakness   . Skin rash   . Small bowel obstruction 2001  . Seizure   . Right shoulder injury   . Left knee injury     Past Surgical History  Procedure Date  .  Joint replacement     right knee replaced 2x  . Colonoscopy   . Exploratory laparotomy with extensive lysis of adhesions 10/1999  . Small bowel obstruction 1996  . Partial small bowel obstruction 1999    Family History  Problem Relation Age of Onset  . Stroke Mother   . Heart disease Mother   . Heart disease Father     History  Substance Use Topics  . Smoking status: Never Smoker   . Smokeless tobacco: Never Used  . Alcohol Use: Yes      Review of Systems  All other systems reviewed and are negative.    Allergies  Ivp dye; Aspirin; Atorvastatin; Butorphanol tartrate; Codeine; Doxycycline; Iohexol; Latex; Methocarbamol; Morphine and related; Oxycodone hcl; Pentazocine lactate; Pentazocine lactate; Succinylcholine chloride; Synvisc; and Dilaudid  Home Medications   Current Outpatient Rx  Name Route Sig Dispense Refill  . ALBUTEROL SULFATE HFA 108 (90 BASE) MCG/ACT IN AERS Inhalation Inhale 2 puffs into the lungs every 6 (six) hours as needed. For wheezing and/or shortness of breath     . ALPRAZOLAM 1 MG PO TABS Oral Take 1 mg by mouth every 6 (six) hours. Take one tablet every 6 to 8 hours and take one tablet at bedtime    . AMLODIPINE BESYLATE 10 MG PO TABS Oral Take 5 mg by mouth daily.    . ASPIRIN 81  MG PO CHEW Oral Chew 81 mg by mouth daily.    . FUROSEMIDE 40 MG PO TABS Oral Take 40 mg by mouth. Taking 80-120 mg daily as needed. Increases as his weight goes up with fluid    . HYOSCYAMINE SULFATE ER 0.375 MG PO TB12 Oral Take 0.375 mg by mouth as needed. For stomach cramps    . LACTULOSE 10 GM/15ML PO SOLN  45 ml po once or twice a day 500 mL 3  . LOSARTAN POTASSIUM 100 MG PO TABS Oral Take 100 mg by mouth daily.     . MORPHINE SULFATE 30 MG PO TABS Oral Take 30 mg by mouth every 4 (four) hours as needed. FOR PAIN    . POLYETHYLENE GLYCOL 3350 PO POWD Oral Take 17 g by mouth daily.      [name] POTASSIUM CHLORIDE CRYS ER 20 MEQ PO TBCR Oral Take 20 mEq by mouth daily.    [name]  PROMETHAZINE HCL 25 MG PO TABS Oral Take 25 mg by mouth every 6 (six) hours as needed. FOR NAUSEA    . SENNOSIDES 8.6 MG PO TABS Oral Take 1 tablet by mouth as needed. For constipation       BP 156/92  Pulse 91  Temp 98.2 F (36.8 C) (Oral)  Resp 18  SpO2 96%  Physical Exam  Nursing note and vitals reviewed. Constitutional: He appears well-developed and well-nourished. No distress.       Awake, alert, nontoxic appearance  HENT:  Head: Atraumatic.  Eyes: Conjunctivae are normal. Right eye exhibits no discharge. Left eye exhibits no discharge.  Neck: Normal range of motion. Neck supple.  Cardiovascular: Normal rate and regular rhythm.   Pulmonary/Chest: Effort normal. No respiratory distress. He exhibits no tenderness.  Abdominal: Soft. There is no tenderness. There is no rebound.  Musculoskeletal: He exhibits no edema and no tenderness.       ROM appears intact, no obvious focal weakness  Neurological: He is alert.  Skin: Skin is warm and dry. No rash noted.  Psychiatric: He has a normal mood and affect.    ED Course  Procedures (including critical care time)  Labs Reviewed - No data to display No results found.   No diagnosis found.   Date: 11/25/2011  Rate: 87  Rhythm: normal sinus rhythm  QRS Axis: normal  Intervals: normal  ST/T Wave abnormalities: normal  Conduction Disutrbances:none  Narrative Interpretation:   Old EKG Reviewed: unchanged  Results for orders placed during the hospital encounter of 11/25/11  CBC WITH DIFFERENTIAL      Component Value Range   WBC 5.0  4.0 - 10.5 K/uL   RBC 4.92  4.22 - 5.81 MIL/uL   Hemoglobin 14.5  13.0 - 17.0 g/dL   HCT 21.3  08.6 - 57.8 %   MCV 85.0  78.0 - 100.0 fL   MCH 29.5  26.0 - 34.0 pg   MCHC 34.7  30.0 - 36.0 g/dL   RDW 46.9  62.9 - 52.8 %   Platelets 303  150 - 400 K/uL   Neutrophils Relative 49  43 - 77 %   Neutro Abs 2.4  1.7 - 7.7 K/uL   Lymphocytes Relative 40  12 - 46 %   Lymphs Abs 2.0  0.7 - 4.0  K/uL   Monocytes Relative 8  3 - 12 %   Monocytes Absolute 0.4  0.1 - 1.0 K/uL   Eosinophils Relative 3  0 - 5 %   Eosinophils Absolute  0.1  0.0 - 0.7 K/uL   Basophils Relative 0  0 - 1 %   Basophils Absolute 0.0  0.0 - 0.1 K/uL  BASIC METABOLIC PANEL      Component Value Range   Sodium 137  135 - 145 mEq/L   Potassium 3.9  3.5 - 5.1 mEq/L   Chloride 99  96 - 112 mEq/L   CO2 24  19 - 32 mEq/L   Glucose, Bld 106 (*) 70 - 99 mg/dL   BUN 16  6 - 23 mg/dL   Creatinine, Ser 1.61  0.50 - 1.35 mg/dL   Calcium 09.6  8.4 - 04.5 mg/dL   GFR calc non Af Amer >90  >90 mL/min   GFR calc Af Amer >90  >90 mL/min  TROPONIN I      Component Value Range   Troponin I <0.30  <0.30 ng/mL   Dg Chest 2 View  11/25/2011  *RADIOLOGY REPORT*  Clinical Data: Chest pain and chest pressure.  CHEST - 2 VIEW  Comparison: Chest x-ray with 04/10/2009.  Findings: Lung volumes are normal.  No consolidative airspace disease.  No pleural effusions.  No pneumothorax.  No pulmonary nodule or mass noted.  Pulmonary vasculature and the cardiomediastinal silhouette are within normal limits.  Surgical clips near the gastroesophageal junction (unchanged).  Multiple tiny calcified granulomas in the right lung are again noted.  IMPRESSION: 1. No radiographic evidence of acute cardiopulmonary disease. . 2.  Multiple small calcified granulomas in the right lung.  Original Report Authenticated By: Florencia Reasons, M.D.      MDM  CP concerning for ACS.  ECG is unremarkable.  Pt still endorse chest pressure.  Low suspicion for PE.  Discussed care with my attending.    2:53 PM Patient received  sublingual nitroglycerin which provides some relief, but the pain returns.  Will start nitro drip.  Will consult cardiology.      3:04 PM Nitro drip started.  I have consulted with [facility] who agrees to see pt in ED for further care.  Pt currently in NAD, VSS.    Fayrene Helper, PA-C 11/25/11 1603

## 2011-11-25 NOTE — ED Notes (Signed)
Pt. Reports pressure and pain in chest 4/10. Nitro titrated up by 5 to .

## 2011-11-25 NOTE — H&P (Signed)
Pt. Seen and examined. Agree with the NP/PA-C note as written.  Chest pain for 1-2 days, mostly associated with diaphoresis. Was 4/10, now 1/10 on nitroglycerin. Feels like a "small elephant" on his chest. He has a history of significant GI motility problems and is being evaluated for a repair of ventral hernia.  EKG does not show acute ischemic changes, however, there is a late precordial transition. NST was negative in 2011. 2 caths by Dr. Elsie Lincoln in the past have been negative, but years ago.  I think this is more likely GI in nature, however, I feel that cardiac cath is the only way to rule-out significant new unstable coronary lesions.  He is agreeable to cath in the am.  Chrystie Nose, MD, [facility] Attending Cardiologist [facility]

## 2011-11-25 NOTE — ED Notes (Signed)
Pt. Reports SOB. Placed on 2L [place].

## 2011-11-25 NOTE — ED Notes (Signed)
Reports having intermittent left side chest pains x 4-5 days, reports palpitations that are causing cough and sob. ekg done at triage. resp e/u. Skin w/d.

## 2011-11-26 ENCOUNTER — Encounter (HOSPITAL_COMMUNITY)
Admission: EM | Disposition: A | Discharge status: Home / Self Care | Payer: Self-pay | Source: Home / Self Care | Attending: Internal Medicine

## 2011-11-26 HISTORY — PX: LEFT HEART CATHETERIZATION WITH CORONARY ANGIOGRAM: SHX5451

## 2011-11-26 LAB — CBC
MCHC: 34.3 g/dL (ref 30.0–36.0)
Platelets: 282 10*3/uL (ref 150–400)
RDW: 13.6 % (ref 11.5–15.5)
WBC: 9.5 10*3/uL (ref 4.0–10.5)

## 2011-11-26 LAB — HEMOGLOBIN A1C: Hgb A1c MFr Bld: 5.5 % (ref <5.7)

## 2011-11-26 LAB — CARDIAC PANEL(CRET KIN+CKTOT+MB+TROPI)
CK, MB: 4.8 ng/mL — ABNORMAL HIGH (ref 0.3–4.0)
Relative Index: 3.4 — ABNORMAL HIGH (ref 0.0–2.5)
Total CK: 178 U/L (ref 7–232)
Troponin I: <0.3 ng/mL (ref <0.30)

## 2011-11-26 LAB — LIPID PANEL
Cholesterol: 199 mg/dL (ref 0–200)
HDL: 38 mg/dL — ABNORMAL LOW (ref >39)
Triglycerides: 298 mg/dL — ABNORMAL HIGH (ref <150)
VLDL: 60 mg/dL — ABNORMAL HIGH (ref 0–40)

## 2011-11-26 LAB — BASIC METABOLIC PANEL
BUN: 13 mg/dL (ref 6–23)
Creatinine, Ser: 0.87 mg/dL (ref 0.50–1.35)
GFR calc Af Amer: >90 mL/min (ref >90)
GFR calc non Af Amer: >90 mL/min (ref >90)
Glucose, Bld: 119 mg/dL — ABNORMAL HIGH (ref 70–99)
Potassium: 3.6 mEq/L (ref 3.5–5.1)

## 2011-11-26 LAB — GLUCOSE, CAPILLARY: Glucose-Capillary: 112 mg/dL — ABNORMAL HIGH (ref 70–99)

## 2011-11-26 LAB — MAGNESIUM: Magnesium: 2.1 mg/dL (ref 1.5–2.5)

## 2011-11-26 SURGERY — LEFT HEART CATHETERIZATION WITH CORONARY ANGIOGRAM
Anesthesia: LOCAL

## 2011-11-26 MED ORDER — FAMOTIDINE IN NACL 20-0.9 MG/50ML-% IV SOLN
20.0000 mg | Freq: Once | INTRAVENOUS | Status: AC
  Administered 2011-11-26: 20 mg via INTRAVENOUS

## 2011-11-26 MED ORDER — METHYLPREDNISOLONE SODIUM SUCC 125 MG IJ SOLR
INTRAMUSCULAR | Status: AC
  Filled 2011-11-26: qty 2

## 2011-11-26 MED ORDER — ACETAMINOPHEN 325 MG PO TABS: 650.0000 mg | ORAL_TABLET | ORAL | Status: DC | PRN

## 2011-11-26 MED ORDER — HYDRALAZINE HCL 20 MG/ML IJ SOLN
10.0000 mg | Freq: Once | INTRAMUSCULAR | Status: DC
  Administered 2011-11-26: 10 mg via INTRAVENOUS

## 2011-11-26 MED ORDER — SODIUM CHLORIDE 0.9 % IV SOLN
INTRAVENOUS | Status: DC
  Administered 2011-11-26: 13:00:00 via INTRAVENOUS

## 2011-11-26 MED ORDER — FAMOTIDINE IN NACL 20-0.9 MG/50ML-% IV SOLN
INTRAVENOUS | Status: AC
  Filled 2011-11-26: qty 50

## 2011-11-26 MED ORDER — FENTANYL CITRATE 0.05 MG/ML IJ SOLN
INTRAMUSCULAR | Status: AC
  Filled 2011-11-26: qty 2

## 2011-11-26 MED ORDER — NITROGLYCERIN 0.2 MG/ML ON CALL CATH LAB
INTRAVENOUS | Status: AC
  Filled 2011-11-26: qty 1

## 2011-11-26 MED ORDER — ONDANSETRON HCL 4 MG/2ML IJ SOLN: 4.0000 mg | Freq: Four times a day (QID) | INTRAMUSCULAR | Status: DC | PRN

## 2011-11-26 MED ORDER — DIPHENHYDRAMINE HCL 50 MG/ML IJ SOLN
25.0000 mg | Freq: Once | INTRAMUSCULAR | Status: AC
  Administered 2011-11-26: 25 mg via INTRAVENOUS
  Filled 2011-11-26: qty 1

## 2011-11-26 MED ORDER — VERAPAMIL HCL 2.5 MG/ML IV SOLN
INTRAVENOUS | Status: AC
Start: 2011-11-26 — End: 2011-11-26
  Filled 2011-11-26: qty 2

## 2011-11-26 MED ORDER — ERGOCALCIFEROL 8000 UNIT/ML PO SOLN
7000.0000 [IU] | Freq: Every day | ORAL | Status: DC
  Filled 2011-11-26 (×3): qty 0.88

## 2011-11-26 MED ORDER — HYDRALAZINE HCL 20 MG/ML IJ SOLN
INTRAMUSCULAR | Status: AC
  Filled 2011-11-26: qty 1

## 2011-11-26 MED ORDER — MIDAZOLAM HCL 2 MG/2ML IJ SOLN
INTRAMUSCULAR | Status: AC
  Filled 2011-11-26: qty 2

## 2011-11-26 MED ORDER — HEPARIN (PORCINE) IN NACL 2-0.9 UNIT/ML-% IJ SOLN
INTRAMUSCULAR | Status: AC
  Filled 2011-11-26: qty 2000

## 2011-11-26 MED ORDER — METHYLPREDNISOLONE SODIUM SUCC 125 MG IJ SOLR
60.0000 mg | Freq: Once | INTRAMUSCULAR | Status: AC
  Administered 2011-11-26: 60 mg via INTRAVENOUS

## 2011-11-26 MED ORDER — HEPARIN SODIUM (PORCINE) 1000 UNIT/ML IJ SOLN
INTRAMUSCULAR | Status: AC
  Filled 2011-11-26: qty 1

## 2011-11-26 MED ORDER — ASPIRIN 81 MG PO CHEW: 81.0000 mg | CHEWABLE_TABLET | Freq: Every day | ORAL | Status: DC

## 2011-11-26 MED ORDER — LIDOCAINE HCL (PF) 1 % IJ SOLN
INTRAMUSCULAR | Status: AC
  Filled 2011-11-26: qty 30

## 2011-11-26 NOTE — H&P (Signed)
    Pt was reexamined and existing H & P reviewed. No changes found.  Runell Gess, MD [facility] 11/26/2011 9:10 AM

## 2011-11-26 NOTE — Care Management Note (Signed)
    Page 1 of 1   11/26/2011     9:25:14 AM   CARE MANAGEMENT NOTE 11/26/2011  Patient:  [name], [name]   Account Number:  [id number]  Date Initiated:  11/26/2011  Documentation initiated by:  Junius Creamer  Subjective/Objective Assessment:   adm w angina     Action/Plan:   lives w wife, pcp dr Leodis Sias   Anticipated DC Date:     Anticipated DC Plan:        DC Planning Services  CM consult      Choice offered to / List presented to:             Status of service:   Medicare Important Message given?   (If response is "NO", the following Medicare IM given date fields will be blank) Date Medicare IM given:   Date Additional Medicare IM given:    Discharge Disposition:    Per UR Regulation:  Reviewed for med. necessity/level of care/duration of stay  If discussed at Long Length of Stay Meetings, dates discussed:    Comments:  6/27 9:24a debbie Tolulope Pinkett rn,bsn [phone]

## 2011-11-26 NOTE — Progress Notes (Signed)
ANTICOAGULATION CONSULT NOTE - Follow Up Consult  Pharmacy Consult for heparin Indication: chest pain/ACS  Labs:  Basename 11/26/11 0035 11/25/11 1638 11/25/11 1321 11/25/11 1251  HGB -- -- -- 14.5  HCT -- -- -- 41.8  PLT -- -- -- 303  APTT -- 30 -- --  LABPROT -- 13.8 -- --  INR -- 1.04 -- --  HEPARINUNFRC 0.20* -- -- --  CREATININE -- -- -- 0.78  CKTOTAL -- 201 -- --  CKMB -- 8.4* -- --  TROPONINI -- <0.30 <0.30 --    Assessment: 61yo male subtherapeutic on heparin with initial dosing for CP.  Goal of Therapy:  Heparin level 0.3-0.7 units/ml   Plan:  Will increase heparin gtt by 2 units/kg/hr to 1500 units/hr and check level with next CE.  Colleen Can PharmD BCPS 11/26/2011,1:32 AM

## 2011-11-26 NOTE — Discharge Summary (Signed)
Physician Discharge Summary  Patient ID: [name] MRN: [id number] DOB/AGE: December 01, 1950 61 y.o.  Admit date: 11/25/2011 Discharge date: 11/26/2011  Admission Diagnoses:  Discharge Diagnoses:  Principal Problem:  *Unstable angina Active Problems:  DIABETES MELLITUS, TYPE II  HYPERLIPIDEMIA  PERIPHERAL NEUROPATHY  HYPERTENSION  Constipation, chronic  Back pain, chronic, followed at pain clinic  Family history of acute myocardial infarction. and premature CAD   Discharged Condition: stable  Hospital Course:   61 year old white married male patient of Dr.  Lemma and Dr. Modesto [name] at [facility] Presented to the emergency room per the recommendation of both his primary care and our office Secondary to increasing episodes of chest pain. He stated his pain started 3 or 4 days ago.  It felt that he had a 300 pound elephant sitting on his left anterior chest. It was associated with shortness of breath nausea and vomiting. He has a history of  cardiac catheterization in 2002 with normal coronary arteries.  Patient had a lexiscan Myoview in 2011. There was no ischemia.  EF was 55%. Last 2D echo was December 2012.  EF was 50-55% mild concentric left ventricular hypertrophy and impaired LV relaxation. The LA was moderately dilated. He also had borderline aortic root dilatation.   Mr. [name] past medical history is quite complex with multiple comorbidities. He has had a history of DVT but most recent venous Doppler of her lower extremity edema with negative, his also had renal ultrasound which was negative for renal stenosis. He does have labile hypertension. He has a history of TIAs and more recently episodes of memory loss which improved after stopping amitriptyline. He also has flashbacks from his [place] war days; had exposure to agent orange.  Has asthma and osteoarthritis that is quite severe and he is followed by Dr. Vear [name] in the pain clinic.   He has had multiple GI  surgeries including Billroth II, small bowel obstructions, scars he has a ventral hernia followed by Dr. Gerrit [name] who at this time does not want to do surgery. For GI physician he had been seeing Dr. Arlyce [name], but the patient tells me no longer is followed by Dr. Arlyce [name]. Patient has chronic constipation and is on lactulose.   Initial markers were negative. EKG with poor R wave progression in leads V2.and V3. Continues with pain increased pressure in left anterior chest and then with movement will have sharp pain.  Not reproducible with palpation  on his chest. He also has multiple allergies and intolerances. Bystolic was thought to cause increased flashbacks and nightmares he is currently not on a beta blocker because of this.  He was admitted to stepdown.  Started on IV nitro and heparin.  Cardiac enzymes were cycled and negative. He was taken to the cath lab for left heart cath by Dr. Allyson [name] which reveal normal coronary arteries.  He will be discharged home in stable condition.    Consults: None  Significant Diagnostic Studies:   11/26/11, Left heart cath PROCEDURE DESCRIPTION:  The patient was brought to the second floor  [facility] lab in the postabsorptive state. He was  premedicated with Valium 5 mg by mouth, IV Versed and fentanyl.[name] His right wrist  was prepped and shaved in usual sterile fashion. Xylocaine 1% was used  for local anesthesia. A 5 French sheath was inserted into the right radial  artery using standard Seldinger technique. The patient received  5000 units of heparin intravenously. 10 cc of "radial cocktail" was administered through  the Side Arm sheath. A 5 [place] TIG catheter was used for selective coronary arteriography. A pigtail was unable to pass the aortic valve and therefore a left ventricular performed. The pigtail was used for the entirety of the case. Retrograde aortic pressure was monitored during the case.  HEMODYNAMICS:  AO SYSTOLIC/AO DIASTOLIC: 131/84    ANGIOGRAPHIC RESULTS:  1. Left main; normal  2. LAD; normal  3. Left circumflex; codominant and normal.  4. Right coronary artery; codominant and normal  5. Left ventriculography;was not performed.  IMPRESSION:[name] has a normal coronary arteries. I believe his chest pain is noncardiac. Empiric antireflux therapy will be recommended. A 2-D echo will be obtained. The right radial sheath was removed and a TURP and was placed on the wrist chief patent hemostasis. The patient left the cardiac catheterization laboratory in stable condition.  William Gess MD, [facility]  11/26/2011  9:41 AM  Cardiac Panel (last 3 results)  Basename 11/26/11 0830 11/26/11 0036 11/25/11 1638  CKTOTAL 174 178 201  CKMB 4.8* 6.1* 8.4*  TROPONINI <0.30 <0.30 <0.30  RELINDX 2.8* 3.4* 4.2*   CBC    Component Value Date/Time   WBC 9.5 11/26/2011 0535   RBC 4.41 11/26/2011 0535   HGB 13.0 11/26/2011 0535   HCT 37.9* 11/26/2011 0535   PLT 282 11/26/2011 0535   MCV 85.9 11/26/2011 0535   MCH 29.5 11/26/2011 0535   MCHC 34.3 11/26/2011 0535   RDW 13.6 11/26/2011 0535   LYMPHSABS 2.0 11/25/2011 1251   MONOABS 0.4 11/25/2011 1251   EOSABS 0.1 11/25/2011 1251   BASOSABS 0.0 11/25/2011 1251    BMET    Component Value Date/Time   NA 138 11/26/2011 0535   K 3.6 11/26/2011 0535   CL 99 11/26/2011 0535   CO2 27 11/26/2011 0535   GLUCOSE 119* 11/26/2011 0535   BUN 13 11/26/2011 0535   CREATININE 0.87 11/26/2011 0535   CREATININE 0.76 03/12/2011 1147   CALCIUM 9.8 11/26/2011 0535   GFRNONAA >90 11/26/2011 0535   GFRAA >90 11/26/2011 0535   Treatments: Left heart cath.  Discharge Exam: Blood pressure 172/76, pulse 92, temperature 97.9 F (36.6 C), temperature source Oral, resp. rate 15, height 6\' 2"  (1.88 m), weight 105.8 kg (233 lb 4 oz), SpO2 98.00%.  Disposition: 01-Home or Self Care  Discharge Orders    Future Orders Please Complete By Expires   Diet - low sodium heart healthy      Increase activity slowly       Discharge instructions      Comments:   No lifting with arm for two days.     Medication List  As of 11/26/2011  2:33 PM   TAKE these medications         ALPRAZolam 1 MG tablet   Commonly known as: XANAX   Take 1 mg by mouth every 6 (six) hours. Take one tablet every 6 to 8 hours and take one tablet at bedtime      amLODipine 10 MG tablet   Commonly known as: NORVASC   Take 5 mg by mouth daily.      aspirin 81 MG chewable tablet   Chew 81 mg by mouth daily.      furosemide 40 MG tablet   Commonly known as: LASIX   Take 40 mg by mouth. Taking 80-120 mg daily as needed. Increases as his weight goes up with fluid      hyoscyamine 0.375 MG 12 hr tablet   Commonly  known as: LEVBID   Take 0.375 mg by mouth as needed. For stomach cramps      lactulose 10 GM/15ML solution   Commonly known as: CHRONULAC   45 ml po once or twice a day      losartan 100 MG tablet   Commonly known as: COZAAR   Take 100 mg by mouth daily.      MIRALAX powder   Generic drug: polyethylene glycol powder   Take 17 g by mouth daily.      morphine 30 MG tablet   Commonly known as: MSIR   Take 30 mg by mouth every 4 (four) hours as needed. FOR PAIN      potassium chloride SA 20 MEQ tablet   Commonly known as: K-DUR,KLOR-CON   Take 20 mEq by mouth daily.      promethazine 25 MG tablet   Commonly known as: PHENERGAN   Take 25 mg by mouth every 6 (six) hours as needed. FOR NAUSEA      PROVENTIL HFA 108 (90 BASE) MCG/ACT inhaler   Generic drug: albuterol   Inhale 2 puffs into the lungs every 6 (six) hours as needed. For wheezing and/or shortness of breath      senna 8.6 MG tablet   Commonly known as: SENOKOT   Take 1 tablet by mouth as needed. For constipation             Signed: Wilburt Finlay 11/26/2011, 2:33 PM

## 2011-11-26 NOTE — Op Note (Signed)
[name] is a 61 y.o. male    [id number] LOCATION:  FACILITY: [facility]  PHYSICIAN: Nanetta Batty, M.D. 02/03/1951   DATE OF PROCEDURE:  11/26/2011  DATE OF DISCHARGE:  [facility]  CARDIAC CATHETERIZATION     History obtained from chart review. [name] is a 61 year old married Caucasian male patient of Dr. Alexis Goodell and Dr. Modesto Charon approximately practice was admitted to the emergency room because of chest pain. The symptoms have been going on for 3-4 days. He has positive cardiac risk factors. He at had a cardiac catheterization in 2002 revealing normal coronary arteries. He was placed on IV heparin and nitroglycerin. Presents now for diagnostic coronary arteriography to define his anatomy.   PROCEDURE DESCRIPTION:    The patient was brought to the second floor  [facility] cath lab in the postabsorptive state. He was  premedicated with Valium 5 mg by mouth, IV Versed and fentanyl.[name] His right wrist was prepped and shaved in usual sterile fashion. Xylocaine 1% was used  for local anesthesia. A 5 French sheath was inserted into the right radial  artery using standard Seldinger technique. The patient received  5000 units  of heparin  intravenously.  10 cc of "radial cocktail" was administered through the Side Arm sheath. A 5 [place] TIG catheter was used for selective coronary arteriography. A pigtail was unable to pass the aortic valve and therefore a left ventricular performed. The pigtail was used for the entirety of the case. Retrograde aortic pressure was monitored during the case.    HEMODYNAMICS:    AO SYSTOLIC/AO DIASTOLIC: 131/84    ANGIOGRAPHIC RESULTS:   1. Left main; normal  2. LAD; normal 3. Left circumflex; codominant and normal.  4. Right coronary artery; codominant and normal 5. Left ventriculography;was not performed.  IMPRESSION:[name] has a normal coronary arteries. I believe his chest pain is noncardiac. Empiric  antireflux therapy will be recommended. A 2-D echo will be obtained. The right radial sheath was removed and a TURP and was placed on the wrist chief patent hemostasis. The patient left the cardiac catheterization laboratory in stable condition.  Runell Gess MD, [facility] 11/26/2011 9:41 AM

## 2011-11-26 NOTE — Progress Notes (Signed)
Subjective:  Continues to have off/on CP  Objective:  Temp:  [97.7 F (36.5 C)-98.4 F (36.9 C)] 97.7 F (36.5 C) (06/27 0348) Pulse Rate:  [70-91] 74  (06/27 0348) Resp:  [8-23] 23  (06/27 0348) BP: (108-156)/(60-100) 115/72 mmHg (06/27 0348) SpO2:  [95 %-100 %] 97 % (06/27 0348) Weight:  [105.8 kg (233 lb 4 oz)] 105.8 kg (233 lb 4 oz) (06/26 1949) Weight change:   Intake/Output from previous day: 06/26 0701 - 06/27 0700 In: 481 [I.V.:481] Out: 2000 [Urine:2000]  Intake/Output from this shift:    Physical Exam: General appearance: alert, cooperative and appears stated age Neck: no adenopathy, no carotid bruit, no JVD, supple, symmetrical, trachea midline and thyroid not enlarged, symmetric, no tenderness/mass/nodules Lungs: clear to auscultation bilaterally Heart: regular rate and rhythm, S1, S2 normal, no murmur, click, rub or gallop Extremities: extremities normal, atraumatic, no cyanosis or edema  Lab Results: Results for orders placed during the hospital encounter of 11/25/11 (from the past 48 hour(s))  CBC WITH DIFFERENTIAL     Status: Normal   Collection Time   11/25/11 12:51 PM      Component Value Range Comment   WBC 5.0  4.0 - 10.5 K/uL    RBC 4.92  4.22 - 5.81 MIL/uL    Hemoglobin 14.5  13.0 - 17.0 g/dL    HCT 44.0  10.2 - 72.5 %    MCV 85.0  78.0 - 100.0 fL    MCH 29.5  26.0 - 34.0 pg    MCHC 34.7  30.0 - 36.0 g/dL    RDW 36.6  44.0 - 34.7 %    Platelets 303  150 - 400 K/uL    Neutrophils Relative 49  43 - 77 %    Neutro Abs 2.4  1.7 - 7.7 K/uL    Lymphocytes Relative 40  12 - 46 %    Lymphs Abs 2.0  0.7 - 4.0 K/uL    Monocytes Relative 8  3 - 12 %    Monocytes Absolute 0.4  0.1 - 1.0 K/uL    Eosinophils Relative 3  0 - 5 %    Eosinophils Absolute 0.1  0.0 - 0.7 K/uL    Basophils Relative 0  0 - 1 %    Basophils Absolute 0.0  0.0 - 0.1 K/uL   BASIC METABOLIC PANEL     Status: Abnormal   Collection Time   11/25/11 12:51 PM      Component Value Range  Comment   Sodium 137  135 - 145 mEq/L    Potassium 3.9  3.5 - 5.1 mEq/L    Chloride 99  96 - 112 mEq/L    CO2 24  19 - 32 mEq/L    Glucose, Bld 106 (*) 70 - 99 mg/dL    BUN 16  6 - 23 mg/dL    Creatinine, Ser 4.25  0.50 - 1.35 mg/dL    Calcium 95.6  8.4 - 10.5 mg/dL    GFR calc non Af Amer >90  >90 mL/min    GFR calc Af Amer >90  >90 mL/min   TROPONIN I     Status: Normal   Collection Time   11/25/11  1:21 PM      Component Value Range Comment   Troponin I <0.30  <0.30 ng/mL   CARDIAC PANEL(CRET KIN+CKTOT+MB+TROPI)     Status: Abnormal   Collection Time   11/25/11  4:38 PM      Component Value Range Comment  Total CK 201  7 - 232 U/L    CK, MB 8.4 (*) 0.3 - 4.0 ng/mL    Troponin I <0.30  <0.30 ng/mL    Relative Index 4.2 (*) 0.0 - 2.5   HEPATIC FUNCTION PANEL     Status: Normal   Collection Time   11/25/11  4:38 PM      Component Value Range Comment   Total Protein 7.1  6.0 - 8.3 g/dL    Albumin 3.8  3.5 - 5.2 g/dL    AST 22  0 - 37 U/L    ALT 26  0 - 53 U/L    Alkaline Phosphatase 84  39 - 117 U/L    Total Bilirubin 1.1  0.3 - 1.2 mg/dL    Bilirubin, Direct 0.2  0.0 - 0.3 mg/dL    Indirect Bilirubin 0.9  0.3 - 0.9 mg/dL   TSH     Status: Normal   Collection Time   11/25/11  4:38 PM      Component Value Range Comment   TSH 1.888  0.350 - 4.500 uIU/mL   PROTIME-INR     Status: Normal   Collection Time   11/25/11  4:38 PM      Component Value Range Comment   Prothrombin Time 13.8  11.6 - 15.2 seconds    INR 1.04  0.00 - 1.49   APTT     Status: Normal   Collection Time   11/25/11  4:38 PM      Component Value Range Comment   aPTT 30  24 - 37 seconds   MRSA PCR SCREENING     Status: Normal   Collection Time   11/25/11  7:00 PM      Component Value Range Comment   MRSA by PCR NEGATIVE  NEGATIVE   HEMOGLOBIN A1C     Status: Normal   Collection Time   11/25/11  7:31 PM      Component Value Range Comment   Hemoglobin A1C 5.5  <5.7 %    Mean Plasma Glucose 111  <117  mg/dL   URINALYSIS, ROUTINE W REFLEX MICROSCOPIC     Status: Normal   Collection Time   11/25/11  9:27 PM      Component Value Range Comment   Color, Urine YELLOW  YELLOW    APPearance CLEAR  CLEAR    Specific Gravity, Urine 1.017  1.005 - 1.030    pH 7.0  5.0 - 8.0    Glucose, UA NEGATIVE  NEGATIVE mg/dL    Hgb urine dipstick NEGATIVE  NEGATIVE    Bilirubin Urine NEGATIVE  NEGATIVE    Ketones, ur NEGATIVE  NEGATIVE mg/dL    Protein, ur NEGATIVE  NEGATIVE mg/dL    Urobilinogen, UA 1.0  0.0 - 1.0 mg/dL    Nitrite NEGATIVE  NEGATIVE    Leukocytes, UA NEGATIVE  NEGATIVE MICROSCOPIC NOT DONE ON URINES WITH NEGATIVE PROTEIN, BLOOD, LEUKOCYTES, NITRITE, OR GLUCOSE <1000 mg/dL.  HEPARIN LEVEL (UNFRACTIONATED)     Status: Abnormal   Collection Time   11/26/11 12:35 AM      Component Value Range Comment   Heparin Unfractionated 0.20 (*) 0.30 - 0.70 IU/mL   CARDIAC PANEL(CRET KIN+CKTOT+MB+TROPI)     Status: Abnormal   Collection Time   11/26/11 12:36 AM      Component Value Range Comment   Total CK 178  7 - 232 U/L    CK, MB 6.1 (*) 0.3 - 4.0 ng/mL CRITICAL VALUE  NOTED.  VALUE IS CONSISTENT WITH PREVIOUSLY REPORTED AND CALLED VALUE.   Troponin I <0.30  <0.30 ng/mL    Relative Index 3.4 (*) 0.0 - 2.5   GLUCOSE, CAPILLARY     Status: Abnormal   Collection Time   11/26/11 12:41 AM      Component Value Range Comment   Glucose-Capillary 112 (*) 70 - 99 mg/dL   MAGNESIUM     Status: Normal   Collection Time   11/26/11  5:35 AM      Component Value Range Comment   Magnesium 2.1  1.5 - 2.5 mg/dL   CBC     Status: Abnormal   Collection Time   11/26/11  5:35 AM      Component Value Range Comment   WBC 9.5  4.0 - 10.5 K/uL    RBC 4.41  4.22 - 5.81 MIL/uL    Hemoglobin 13.0  13.0 - 17.0 g/dL    HCT 40.9 (*) 81.1 - 52.0 %    MCV 85.9  78.0 - 100.0 fL    MCH 29.5  26.0 - 34.0 pg    MCHC 34.3  30.0 - 36.0 g/dL    RDW 91.4  78.2 - 95.6 %    Platelets 282  150 - 400 K/uL   BASIC METABOLIC PANEL      Status: Abnormal   Collection Time   11/26/11  5:35 AM      Component Value Range Comment   Sodium 138  135 - 145 mEq/L    Potassium 3.6  3.5 - 5.1 mEq/L    Chloride 99  96 - 112 mEq/L    CO2 27  19 - 32 mEq/L    Glucose, Bld 119 (*) 70 - 99 mg/dL    BUN 13  6 - 23 mg/dL    Creatinine, Ser 2.13  0.50 - 1.35 mg/dL    Calcium 9.8  8.4 - 08.6 mg/dL    GFR calc non Af Amer >90  >90 mL/min    GFR calc Af Amer >90  >90 mL/min   LIPID PANEL     Status: Abnormal   Collection Time   11/26/11  5:35 AM      Component Value Range Comment   Cholesterol 199  0 - 200 mg/dL    Triglycerides 578 (*) <150 mg/dL    HDL 38 (*) >46 mg/dL    Total CHOL/HDL Ratio 5.2      VLDL 60 (*) 0 - 40 mg/dL    LDL Cholesterol 962 (*) 0 - 99 mg/dL     Imaging: Imaging results have been reviewed  Assessment/Plan:   1. Principal Problem: 2.  *Unstable angina 3. Active Problems: 4.  DIABETES MELLITUS, TYPE II 5.  HYPERLIPIDEMIA 6.  PERIPHERAL NEUROPATHY 7.  HYPERTENSION 8.  Constipation, chronic 9.  Back pain, chronic, followed at pain clinic 10.  Family history of acute myocardial infarction. and premature CAD 11.   Time Spent Directly with Patient:  20 minutes  Length of Stay:  LOS: 1 day   Continues to have CP. On IV Hep/NTG. Enz neg. EKG w/o acute changes. Labs ok. For cath today.  Runell Gess 11/26/2011, 7:47 AM

## 2011-11-27 NOTE — ED Provider Notes (Signed)
Medical screening examination/treatment/procedure(s) were performed by non-physician practitioner and as supervising physician I was immediately available for consultation/collaboration.  Boykin Baetz K Linker, MD 11/27/11 1521 

## 2012-02-25 ENCOUNTER — Ambulatory Visit
Admission: RE | Admit: 2012-02-25 | Discharge: 2012-02-25 | Disposition: A | Discharge status: Home / Self Care | Payer: Medicare Other | Source: Ambulatory Visit | Attending: Anesthesiology | Admitting: Anesthesiology

## 2012-02-25 ENCOUNTER — Other Ambulatory Visit: Payer: Self-pay | Admitting: Family Medicine

## 2012-02-25 ENCOUNTER — Other Ambulatory Visit: Payer: Self-pay | Admitting: Anesthesiology

## 2012-02-25 DIAGNOSIS — R609 Edema, unspecified: Secondary | ICD-10-CM

## 2012-02-25 DIAGNOSIS — R52 Pain, unspecified: Secondary | ICD-10-CM

## 2012-02-25 DIAGNOSIS — W19XXXA Unspecified fall, initial encounter: Secondary | ICD-10-CM

## 2012-03-01 DIAGNOSIS — I350 Nonrheumatic aortic (valve) stenosis: Secondary | ICD-10-CM | POA: Insufficient documentation

## 2012-07-28 ENCOUNTER — Telehealth: Payer: Self-pay | Admitting: Internal Medicine

## 2012-07-28 ENCOUNTER — Telehealth: Payer: Self-pay

## 2012-07-28 NOTE — Telephone Encounter (Signed)
LMTCB x 1 

## 2012-07-28 NOTE — Telephone Encounter (Signed)
This is a duplicate msg

## 2012-07-29 ENCOUNTER — Institutional Professional Consult (permissible substitution): Payer: Medicare Other | Admitting: Internal Medicine

## 2012-07-29 NOTE — Telephone Encounter (Signed)
I spoke with the pt and he states he advised them to cancel his appt today with MW and r/s with [facility]. Pt wife is a current pt of [facility]. Pt states he saw Dr. Sherene Sires in the past and did not get along well so he prefers to see CY. Appt set with CY on 09-02-12. I advised the pt to seek care at his PCP or ER is breathing gets worse. Carron Curie, CMA

## 2012-08-18 ENCOUNTER — Telehealth: Payer: Self-pay | Admitting: Family Medicine

## 2012-08-18 NOTE — Telephone Encounter (Signed)
Thought him and wife had appt tomorrow with dr Modesto Charon  They are bs pt please call

## 2012-08-19 NOTE — Telephone Encounter (Signed)
Spoke with wife yest appt are for next week.

## 2012-08-29 ENCOUNTER — Ambulatory Visit: Payer: Self-pay | Admitting: Family Medicine

## 2012-09-02 ENCOUNTER — Institutional Professional Consult (permissible substitution): Payer: Medicare Other | Admitting: Internal Medicine

## 2012-09-07 ENCOUNTER — Telehealth: Payer: Self-pay | Admitting: Family Medicine

## 2012-09-07 NOTE — Telephone Encounter (Signed)
Swollen ankle- ?has had dvt's in past told pt he needs to be seen by urgent care or here. Offered him an appt with dr Christell Constant this afternoon and he refused - he stated that he will ONLY be seen by dr Modesto Charon and that he would "take care of it". -jhb 09/07/12 @12 :26

## 2012-09-08 ENCOUNTER — Other Ambulatory Visit (HOSPITAL_COMMUNITY): Payer: Self-pay | Admitting: Cardiology

## 2012-09-08 ENCOUNTER — Ambulatory Visit (HOSPITAL_COMMUNITY)
Admission: RE | Admit: 2012-09-08 | Discharge: 2012-09-08 | Disposition: A | Discharge status: Home / Self Care | Payer: Medicare Other | Source: Ambulatory Visit | Attending: Cardiology | Admitting: Cardiology

## 2012-09-08 DIAGNOSIS — M79609 Pain in unspecified limb: Secondary | ICD-10-CM

## 2012-09-08 DIAGNOSIS — R609 Edema, unspecified: Secondary | ICD-10-CM

## 2012-09-08 DIAGNOSIS — I82402 Acute embolism and thrombosis of unspecified deep veins of left lower extremity: Secondary | ICD-10-CM

## 2012-09-08 NOTE — Progress Notes (Signed)
Venous Lower Ext. Left Completed.  [name]

## 2012-09-09 NOTE — Telephone Encounter (Signed)
Patient probably going back to [facility].

## 2012-09-14 ENCOUNTER — Ambulatory Visit: Payer: Self-pay | Admitting: Family Medicine

## 2012-10-14 ENCOUNTER — Ambulatory Visit (INDEPENDENT_AMBULATORY_CARE_PROVIDER_SITE_OTHER): Payer: Medicare Other | Admitting: Family Medicine

## 2012-10-14 ENCOUNTER — Encounter: Payer: Self-pay | Admitting: Family Medicine

## 2012-10-14 VITALS — BP 181/99 | HR 80 | Temp 98.1°F | Ht 73.0 in | Wt 234.0 lb

## 2012-10-14 DIAGNOSIS — Z860100 Personal history of colon polyps, unspecified: Secondary | ICD-10-CM

## 2012-10-14 DIAGNOSIS — M1710 Unilateral primary osteoarthritis, unspecified knee: Secondary | ICD-10-CM

## 2012-10-14 DIAGNOSIS — IMO0002 Reserved for concepts with insufficient information to code with codable children: Secondary | ICD-10-CM

## 2012-10-14 DIAGNOSIS — K5900 Constipation, unspecified: Secondary | ICD-10-CM

## 2012-10-14 DIAGNOSIS — G609 Hereditary and idiopathic neuropathy, unspecified: Secondary | ICD-10-CM

## 2012-10-14 DIAGNOSIS — I739 Peripheral vascular disease, unspecified: Secondary | ICD-10-CM

## 2012-10-14 DIAGNOSIS — E785 Hyperlipidemia, unspecified: Secondary | ICD-10-CM

## 2012-10-14 DIAGNOSIS — M549 Dorsalgia, unspecified: Secondary | ICD-10-CM

## 2012-10-14 DIAGNOSIS — I10 Essential (primary) hypertension: Secondary | ICD-10-CM

## 2012-10-14 DIAGNOSIS — K222 Esophageal obstruction: Secondary | ICD-10-CM

## 2012-10-14 DIAGNOSIS — M1990 Unspecified osteoarthritis, unspecified site: Secondary | ICD-10-CM

## 2012-10-14 DIAGNOSIS — E119 Type 2 diabetes mellitus without complications: Secondary | ICD-10-CM

## 2012-10-14 DIAGNOSIS — K5730 Diverticulosis of large intestine without perforation or abscess without bleeding: Secondary | ICD-10-CM

## 2012-10-14 DIAGNOSIS — K559 Vascular disorder of intestine, unspecified: Secondary | ICD-10-CM

## 2012-10-14 DIAGNOSIS — Z8679 Personal history of other diseases of the circulatory system: Secondary | ICD-10-CM

## 2012-10-14 MED ORDER — CARVEDILOL 12.5 MG PO TABS: 12.5000 mg | ORAL_TABLET | Freq: Two times a day (BID) | ORAL | Status: DC

## 2012-10-14 NOTE — Progress Notes (Signed)
Patient ID: [name], male   DOB: 03-Sep-1950, 62 y.o.   MRN: [id number] SUBJECTIVE: HPI: Patient is here for follow up of hypertension/IBS with constipation/PTSD/ DM/Hyperlipidemia/GERD. : BP shot up. Was dealing with a copper head snake last night. Has been dyspneic cough and wheeze. denies Headache;deniesChest Pain;denies weakness;denies Shortness of Breath or Orthopnea;denies Visual changes;denies palpitations;denies cough;denies pedal edema;denies symptoms of TIA or stroke; admits to Compliance with medications. denies Problems with medications.   PMH/PSH: reviewed/updated in Epic  SH/FH: reviewed/updated in Epic  Allergies: reviewed/updated in Epic  Medications: reviewed/updated in Epic  Immunizations: reviewed/updated in Epic  ROS: As above in the HPI. All other systems are stable or negative.  OBJECTIVE: APPEARANCE:  Patient in no acute distress.The patient appeared well nourished and normally developed. Acyanotic. Waist: VITAL SIGNS:BP 181/99  Pulse 80  Temp(Src) 98.1 F (36.7 C) (Oral)  Ht 6\' 1"  (1.854 m)  Wt 234 lb (106.142 kg)  BMI 30.88 kg/m2 Obese WM  SKIN: warm and  Dry without overt rashes, tattoos and scars  HEAD and Neck: without JVD, Head and scalp: normal Eyes:No scleral icterus. Fundi normal, eye movements normal. Ears: Auricle normal, canal normal, Tympanic membranes normal, insufflation normal. Nose: normal Throat: normal Neck & thyroid: normal  CHEST & LUNGS: Chest wall: normal Lungs: Clear  CVS: Reveals the PMI to be normally located. Regular rhythm, First and Second Heart sounds are normal,  absence of murmurs, rubs or gallops. Peripheral vasculature: Radial pulses: normal Dorsal pedis pulses: normal Posterior pulses: normal  ABDOMEN:  Appearance:obese Benign,, no organomegaly, no masses, no Abdominal Aortic enlargement. No Guarding , no rebound. No Bruits. Bowel sounds: normal  RECTAL: N/A GU: N/A  EXTREMETIES:  nonedematous. Both Femoral and Pedal pulses are normal.  MUSCULOSKELETAL:  Spine: reduced ROM, pain in the lower lumbar area.   NEUROLOGIC: oriented to time,place and person; nonfocal. Strength is normal  ASSESSMENT: HTN (hypertension) - Plan: EKG 12-Lead  PERIPHERAL NEUROPATHY  OSTEOARTHRITIS  ISCHEMIC COLITIS  HYPERLIPIDEMIA  ESOPHAGEAL STRICTURE  DIVERTICULOSIS, COLON  CEREBROVASCULAR ACCIDENT, HX OF  CLAUDICATION  COLONIC POLYPS, HX OF  CONSTIPATION  DEGENERATIVE JOINT DISEASE, LEFT KNEE  DIABETES MELLITUS, TYPE II  Back pain    PLAN: Orders Placed This Encounter  Procedures  . EKG 12-Lead   Meds ordered this encounter  Medications  . DISCONTD: carvedilol (COREG) 6.25 MG tablet    Sig: Take 1.5 tablets by mouth 2 (two) times daily.  . fenofibrate 54 MG tablet    Sig: Take 1 tablet by mouth daily.  [name] LINZESS 145 MCG CAPS    Sig: Take 1 capsule by mouth daily.  . montelukast (SINGULAIR) 10 MG tablet    Sig: Take 1 tablet by mouth at bedtime.  . pravastatin (PRAVACHOL) 40 MG tablet    Sig: Take 1 tablet by mouth daily.  [name] DIOVAN 320 MG tablet    Sig: Take 1 tablet by mouth daily.  [name] tiotropium (SPIRIVA) 18 MCG inhalation capsule    Sig: Place 18 mcg into inhaler and inhale daily.  . carvedilol (COREG) 12.5 MG tablet    Sig: Take 1 tablet (12.5 mg total) by mouth 2 (two) times daily with a meal.    Dispense:  60 tablet    Refill:  3   Review of meds made.  Discussed healthy lifestyle  RTc in 2 weeks to recheck the BP  Quaniyah Bugh P. Modesto Charon, M.D.

## 2012-10-18 ENCOUNTER — Other Ambulatory Visit: Payer: Self-pay | Admitting: *Deleted

## 2012-10-18 DIAGNOSIS — Z79899 Other long term (current) drug therapy: Secondary | ICD-10-CM

## 2012-10-18 MED ORDER — VALSARTAN-HYDROCHLOROTHIAZIDE 320-12.5 MG PO TABS: 1.0000 | ORAL_TABLET | Freq: Every day | ORAL | Status: DC

## 2012-10-18 NOTE — Telephone Encounter (Signed)
Left message for patient to call back  Per Dr Herbie Baltimore  It is okay to stop Diovan and change to Valsartan/HCTZ 320-12.5 mg. He will need a BMP in 2 weeks after starting the new medication.  Rx will be e-prescribe and slip to lab.

## 2012-10-19 NOTE — Telephone Encounter (Signed)
Returning your call from yesterday. °

## 2012-10-21 ENCOUNTER — Encounter: Payer: Self-pay | Admitting: Cardiology

## 2012-10-25 ENCOUNTER — Ambulatory Visit (INDEPENDENT_AMBULATORY_CARE_PROVIDER_SITE_OTHER): Payer: Medicare Other | Admitting: Internal Medicine

## 2012-10-25 ENCOUNTER — Encounter: Payer: Self-pay | Admitting: Cardiology

## 2012-10-25 ENCOUNTER — Ambulatory Visit (INDEPENDENT_AMBULATORY_CARE_PROVIDER_SITE_OTHER): Payer: Medicare Other | Admitting: Cardiology

## 2012-10-25 ENCOUNTER — Encounter: Payer: Self-pay | Admitting: Internal Medicine

## 2012-10-25 VITALS — BP 120/82 | HR 69 | Ht 73.0 in | Wt 242.9 lb

## 2012-10-25 VITALS — BP 122/80 | HR 66 | Ht 73.0 in | Wt 245.8 lb

## 2012-10-25 DIAGNOSIS — J45901 Unspecified asthma with (acute) exacerbation: Secondary | ICD-10-CM

## 2012-10-25 DIAGNOSIS — R079 Chest pain, unspecified: Secondary | ICD-10-CM

## 2012-10-25 DIAGNOSIS — R0602 Shortness of breath: Secondary | ICD-10-CM

## 2012-10-25 DIAGNOSIS — J42 Unspecified chronic bronchitis: Secondary | ICD-10-CM

## 2012-10-25 DIAGNOSIS — I350 Nonrheumatic aortic (valve) stenosis: Secondary | ICD-10-CM

## 2012-10-25 DIAGNOSIS — J984 Other disorders of lung: Secondary | ICD-10-CM

## 2012-10-25 DIAGNOSIS — I10 Essential (primary) hypertension: Secondary | ICD-10-CM

## 2012-10-25 DIAGNOSIS — E785 Hyperlipidemia, unspecified: Secondary | ICD-10-CM

## 2012-10-25 DIAGNOSIS — I359 Nonrheumatic aortic valve disorder, unspecified: Secondary | ICD-10-CM

## 2012-10-25 DIAGNOSIS — I200 Unstable angina: Secondary | ICD-10-CM | POA: Insufficient documentation

## 2012-10-25 MED ORDER — NITROGLYCERIN 0.4 MG SL SUBL: 0.4000 mg | SUBLINGUAL_TABLET | SUBLINGUAL | Status: DC | PRN

## 2012-10-25 MED ORDER — ISOSORBIDE MONONITRATE ER 30 MG PO TB24: 30.0000 mg | ORAL_TABLET | Freq: Every day | ORAL | Status: DC

## 2012-10-25 NOTE — Patient Instructions (Addendum)
Order- lab- Quant TB gold              Schedule PFT  Dx chronic bronchitis

## 2012-10-25 NOTE — Assessment & Plan Note (Signed)
Will need to check an echocardiogram in 2015.
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
## 2012-10-25 NOTE — Patient Instructions (Addendum)
Your physician recommended you follow up with a dermatologist to evaluate a rash.  Physicians to start you on a long-acting nitroglycerin medicine: Imdur (take this 30 minutes after aspirin at night.), along with an as needed under the tongue nitroglycerin glycerin medicine. You've had a relatively recent evaluation of your heart from heart catheterization echocardiogram and nuclear stress tests all within the last year, which did not show any significant problems. These don't evaluate small vessel disease which is the reason for treating you in the medications only.  Will follow-up in 3-4 months see how you're doing.  Marykay Lex, MD

## 2012-10-25 NOTE — Assessment & Plan Note (Addendum)
Not too sure what to make his symptoms. But is having dyspnea on exertion and discomfort on exertion, we cannot rule out small vessel disease. Treatment of choice is medical therapy. He started on high-dose of beta blocker and ARB. I will add long-acting nitrate with when necessary nitroglycerin glycerin. Recommended takes the nitrate 30 minutes after aspirin. If he indeed will need to have knee surgery, we would then consider a stress test for verification purposes.

## 2012-10-25 NOTE — Assessment & Plan Note (Signed)
On statin and fibratefollowed by primary physician.

## 2012-10-25 NOTE — Telephone Encounter (Signed)
SPOKE TO PATIENT TODAY AT HIS APPT. INFORMED HIM THAT A RX WAS SENT TO HIS PHARMACY . STOP DIOVAN AND START DIOVAN/HCTZ 320/12.5

## 2012-10-25 NOTE — Progress Notes (Signed)
[facility]   Clinic Note:  Patient ID: [name], male   DOB: [birth year], 62 y.o.   MRN: [id number]  HPI: [name] is a 62 y.o. male with a PMH below who presents today for followup and evaluation of intermittent episodes of exertional chest tightness and shortness of breath. He also notes sometimes waking up in the morning short of breath..  The patient is well-known to me, he has had a relatively full workup over last year to evaluate possible cardiac issues. To date he has been evaluated with cardiac catheterization in June 2013 with nonobstructive coronary artery disease, this confirmed his the negative Myoview from April 2013. He has had a relatively normal echocardiogram in October 2013 with grade 1 diastolic dysfunction and mild aortic stenosis.  Interval History: He presents today with intermittent episodes of twinging in his chest that he notes with exertion -- most notable when he walks up a hill or does a significant exertion. He gets a little short of breath with it. This is improved with taking an extra dose of aspirin.  Visiting off her couple weeks now. Is nothing that makes an overly concerned but he would just want to make sure that it was okay. He was recently seen by his primary care provider for elevated blood pressures. I'm not sure changes were made. He is currently taking twice a day Coreg 12.5 mg and will be switched from Diovan to Diovan/HCTZ. He also notes having a small reddish rash on his hands and feet.  The remainder of his Cardiovascular ROS: positive for - chest pain, dyspnea on exertion, edema, orthopnea and The penis is relatively stable control with compression stockings and furosemide. He sleeps on a wedge for must GI his breathing wheezes. negative for - irregular heartbeat, loss of consciousness, palpitations, paroxysmal nocturnal dyspnea, rapid heart rate or shortness of breath is as follows: Additional cardiac review of  systems: Lightheadedness - no, dizzinesscno, syncope/near-syncope - no; TIA/amaurosis fugax - no Melena - no, hematochezia no; hematuria - no; nosebleeds - no; claudication - no  History of Agent Orange Exposure. Past Medical History  Diagnosis Date  . Diabetes mellitus   . Dyslipidemia (high LDL; low HDL)     statin intolerant; on fibrate  . Hypertension     very labile  . GERD (gastroesophageal reflux disease)   . Asthma   . Stroke   . Colon polyps   . Anemia   . Depression   . Osteoarthritis   . Clotting disorder     LLE DVT (also small vessel)  . COPD (chronic obstructive pulmonary disease)   . Neuromuscular disorder     Agent orange; Peripheral Neuroapthy  . Hearing loss   . Fainting   . Weakness   . Small bowel obstruction 2001    s/p multiple bowel surgeries; from war wounds  . Seizure   . Right shoulder injury   . Left knee injury   . Back pain, chronic, followed at pain clinic 11/25/2011  . Family history of acute myocardial infarction. and premature CAD 11/25/2011  . Nephrolithiasis   . Mild aortic stenosis by prior echocardiogram 03/2012    moderate Sclerosis.  . Chest pain with low risk for cardiac etiology 10/2011    Non-obstructive CAD by Cath; negative Lexiscan in 08/2011  . Edema leg     Wears compression stockings; 08/2012 dopplers - no DVT or thrombophlebitis; mild R Popliteal V reflux - no VNUS ablation candidate  .  Dementia     Mild    Prior Cardiac Evaluation and Past Surgical History: Past Surgical History  Procedure Laterality Date  . Joint replacement      right knee replaced 2x  . Colonoscopy    . Exploratory laparotomy with extensive lysis of adhesions  10/1999  . Small bowel obstruction  1996  . Partial small bowel obstruction  1999  . Lexiscan myoview  08/2011    Negative  . Doppler echocardiography  2012    Essentially normal wirth moderate concentric LVH and moderate aortic sclerosis   . Cardiac catheterization  11/26/2011    Normal   Carotid Dopplers 8/13 - mild disease; no significant obstruction  Family History of CAD.  Allergies  Allergen Reactions  . Ivp Dye (Iodinated Diagnostic Agents)     Omni - Paque Contrast   . Aspirin   . Atorvastatin     REACTION: myalgias  . Butorphanol Tartrate   . Bystolic (Nebivolol Hcl)     Nightmares, flashbacks  . Codeine   . Doxycycline   . Iohexol      Desc: hives,neck and torso erythemia   . Latex   . Methocarbamol     rash  . Morphine And Related     Pt is on out patient morphine not allergic to morphine  . Oxycodone Hcl   . Pentazocine Lactate     rash  . Succinylcholine Chloride   . Synvisc (Hylan G-F 20)     rash  . Dilaudid (Hydromorphone Hcl) Nausea Only and Rash    Aggressive     Current Outpatient Prescriptions  Medication Sig Dispense Refill  . albuterol (PROVENTIL HFA) 108 (90 BASE) MCG/ACT inhaler Inhale 2 puffs into the lungs every 6 (six) hours as needed. For wheezing and/or shortness of breath       . ALPRAZolam (XANAX) 1 MG tablet Take 1 mg by mouth every 6 (six) hours. Take one tablet every 6 to 8 hours and take one tablet at bedtime      . aspirin 81 MG chewable tablet Chew 81 mg by mouth daily.      . beclomethasone (BECONASE-AQ) 42 MCG/SPRAY nasal spray Place 2 sprays into the nose 2 (two) times daily. Dose is for each nostril.      . fenofibrate 54 MG tablet Take 1 tablet by mouth daily.      . furosemide (LASIX) 40 MG tablet Take 40 mg by mouth. Taking 80-120 mg daily as needed. Increases as his weight goes up with fluid      . hyoscyamine (LEVBID) 0.375 MG 12 hr tablet Take 0.375 mg by mouth as needed. For stomach cramps      . lactulose (CHRONULAC) 10 GM/15ML solution 45 ml po once or twice a day  500 mL  3  . LINZESS 145 MCG CAPS Take 1 capsule by mouth daily.      . montelukast (SINGULAIR) 10 MG tablet Take 1 tablet by mouth at bedtime.      [name] morphine (MSIR) 30 MG tablet Take 30 mg by mouth every 4 (four) hours as needed. FOR PAIN       . polyethylene glycol powder (MIRALAX) powder Take 17 g by mouth daily as needed.       . potassium chloride SA (K-DUR,KLOR-CON) 20 MEQ tablet Take 20 mEq by mouth daily.      . pravastatin (PRAVACHOL) 40 MG tablet Take 1 tablet by mouth daily.      . promethazine (PHENERGAN)  25 MG tablet Take 25 mg by mouth every 6 (six) hours as needed. FOR NAUSEA      . tiotropium (SPIRIVA) 18 MCG inhalation capsule Place 18 mcg into inhaler and inhale daily.      . carvedilol (COREG) 12.5 MG tablet Take 1 tablet (12.5 mg total) by mouth 2 (two) times daily with a meal.  60 tablet  3  . isosorbide mononitrate (IMDUR) 30 MG 24 hr tablet Take 1 tablet (30 mg total) by mouth at bedtime.  90 tablet  3  . nitroGLYCERIN (NITROSTAT) 0.4 MG SL tablet Place 1 tablet (0.4 mg total) under the tongue every 5 (five) minutes as needed for chest pain.  20 tablet  6  . valsartan-hydrochlorothiazide (DIOVAN-HCT) 320-12.5 MG per tablet Take 1 tablet by mouth daily.  30 tablet  6   No current facility-administered medications for this visit.    History   Social History  . Marital Status: Married    Spouse Name: N/A    Number of Children: 3  . Years of Education: N/A   Occupational History  . Disabled    Social History Main Topics  . Smoking status: Never Smoker   . Smokeless tobacco: Never Used  . Alcohol Use: No  . Drug Use: No  . Sexually Active: Not on file   Other Topics Concern  . Not on file   Social History Narrative   He is a father of 3, grandfather 5. One of his sons was killed during the 4 in [place]. 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 He was exposed to [employer].   Not very physically active bed due to extreme disability/debilitation from osteoarthritis and peripheral neuropathy.    ROS: A comprehensive Review of Systems - Negative except Pertinent positive as above and below. Psychological ROS: positive for - irritability, mood swings and Significant  social stress Musculoskeletal ROS: positive for - gait disturbance, joint pain and pain in knee - left, As well as bilateral hips. Neurological ROS: no TIA or stroke symptoms positive for - impaired coordination/balance, numbness/tingling and peripheral neuropathy Dermatological ROS: positive for pruritus, rash and In a stocking-glove configuration  PHYSICAL EXAM BP 120/82  Pulse 69  Ht 6\' 1"  (1.854 m)  Wt 242 lb 14.4 oz (110.179 kg)  BMI 32.05 kg/m2 General appearance: alert, cooperative, appears stated age, no distress and mildly obese Neck: no adenopathy, no carotid bruit, no JVD, supple, symmetrical, trachea midline and thyroid not enlarged, symmetric, no tenderness/mass/nodules Lungs: clear to auscultation bilaterally, normal percussion bilaterally and Nonlabored Heart: regular rate and rhythm, S1, S2 normal, no S3 or S4, systolic murmur: systolic ejection 2/6, crescendo, decrescendo and harsh at 2nd right intercostal space, radiates to carotids, no click and no rub Abdomen: normal findings: bowel sounds normal, liver span normal to percussion, no masses palpable and no organomegaly and abnormal findings:  incisional hernia and An extensive scars from his multiple surgeries. There is some tenderness along these areas but not overall. Extremities: edema 1+ bilaterally; the cellulitis related swelling and redness is no longer present on the right leg. and venous stasis dermatitis noted Skin: macular - hand(s) bilateral, lower leg(s) bilateral, ankle(s) bilateral, feet bilateral, petechiae - The rash appears to be macular and almost petechial in nature, erythematous., telangiectasias - face, lower leg(s) bilateral and varicosities - lower leg(s) bilateral  JYN:[id number] today: Yes Rate: 69 , Rhythm: Normal sinus;  otherwise normal ECG.  ASSESSMENT:  SOB (shortness of breath) -  Plan: EKG 12-Lead  Chest pain - Plan: EKG 12-Lead  Chest pain with low risk for cardiac  etiology  HYPERTENSION - very labile  HYPERLIPIDEMIA  Mild aortic stenosis by prior echocardiogram  PLAN: Per problem list.   Followup: Roughly 4 months  Miyo Aina W, M.D., M.S. [facility] [address] [place], [place]  [address]  [phone] Pager # [phone] 10/26/2012 11:23 AM

## 2012-10-25 NOTE — Assessment & Plan Note (Signed)
Well-controlled today. On 12.5 twice a day Coreg, and Diovan 320 mg daily. He will change to Diovan/HCTZ with the next prescription  Due to insurance issues.

## 2012-10-26 ENCOUNTER — Encounter: Payer: Self-pay | Admitting: Cardiology

## 2012-10-26 NOTE — Assessment & Plan Note (Signed)
Is mostly exertional shortness of breath, my overall impression is that this is probably due to deconditioning. My examination would be to see what his pulmonology exam show later today. His cardiac evaluation to date is not very forthcoming with any potential etiology for dyspnea.

## 2012-10-31 ENCOUNTER — Ambulatory Visit: Payer: Medicare Other | Admitting: Family Medicine

## 2012-11-01 ENCOUNTER — Encounter: Payer: Self-pay | Admitting: Family Medicine

## 2012-11-01 ENCOUNTER — Ambulatory Visit (INDEPENDENT_AMBULATORY_CARE_PROVIDER_SITE_OTHER): Payer: Medicare Other | Admitting: Family Medicine

## 2012-11-01 VITALS — BP 172/98 | HR 72 | Temp 97.8°F | Wt 234.6 lb

## 2012-11-01 DIAGNOSIS — I350 Nonrheumatic aortic (valve) stenosis: Secondary | ICD-10-CM

## 2012-11-01 DIAGNOSIS — M1990 Unspecified osteoarthritis, unspecified site: Secondary | ICD-10-CM

## 2012-11-01 DIAGNOSIS — J45901 Unspecified asthma with (acute) exacerbation: Secondary | ICD-10-CM

## 2012-11-01 DIAGNOSIS — Z87442 Personal history of urinary calculi: Secondary | ICD-10-CM

## 2012-11-01 DIAGNOSIS — E785 Hyperlipidemia, unspecified: Secondary | ICD-10-CM

## 2012-11-01 DIAGNOSIS — I10 Essential (primary) hypertension: Secondary | ICD-10-CM

## 2012-11-01 DIAGNOSIS — E739 Lactose intolerance, unspecified: Secondary | ICD-10-CM

## 2012-11-01 DIAGNOSIS — I359 Nonrheumatic aortic valve disorder, unspecified: Secondary | ICD-10-CM

## 2012-11-01 DIAGNOSIS — K219 Gastro-esophageal reflux disease without esophagitis: Secondary | ICD-10-CM

## 2012-11-01 DIAGNOSIS — R0602 Shortness of breath: Secondary | ICD-10-CM

## 2012-11-01 DIAGNOSIS — J984 Other disorders of lung: Secondary | ICD-10-CM

## 2012-11-01 DIAGNOSIS — G609 Hereditary and idiopathic neuropathy, unspecified: Secondary | ICD-10-CM

## 2012-11-01 NOTE — Progress Notes (Signed)
4;45 PM BP 168/102 rt arm large cuff----------gh rn 5pm BP 177/100 RT ARM LARGE CUFF --[facility] RN

## 2012-11-01 NOTE — Progress Notes (Signed)
Patient ID: [name], male   DOB: 1951/02/01, 62 y.o.   MRN: [id number] SUBJECTIVE: Chief Complaint  Patient presents with  . Follow-up    2 week follow up for bp  says imdur making him sick   HPI: Last seen 2 weeks ago when his BP medication was adjusted. His BP was doing very well. He saw the cardiologist who added Imdur to his regimen. He has discontinued it today because of the headaches and sick feeling. For some reason his BP is very high today. Having cramps in his legs like his potassium may be low. The Pulmonologist is concerned about his Chest Xray findings and he is due for other studies. No Cest pain, but always has dyspnea and chest tightness of a pulmonary nature.   PMH/PSH: reviewed/updated in Epic  SH/FH: reviewed/updated in Epic  Allergies: reviewed/updated in Epic  Medications: reviewed/updated in Epic Current outpatient prescriptions:albuterol (PROVENTIL HFA) 108 (90 BASE) MCG/ACT inhaler, Inhale 2 puffs into the lungs every 6 (six) hours as needed. For wheezing and/or shortness of breath , Disp: , Rfl: ;  ALPRAZolam (XANAX) 1 MG tablet, Take 1 mg by mouth every 6 (six) hours. Take one tablet every 6 to 8 hours and take one tablet at bedtime, Disp: , Rfl: ;  aspirin 81 MG chewable tablet, Chew 81 mg by mouth daily., Disp: , Rfl:  beclomethasone (BECONASE-AQ) 42 MCG/SPRAY nasal spray, Place 2 sprays into the nose 2 (two) times daily. Dose is for each nostril., Disp: , Rfl: ;  carvedilol (COREG) 12.5 MG tablet, Take 1 tablet (12.5 mg total) by mouth 2 (two) times daily with a meal., Disp: 60 tablet, Rfl: 3;  fenofibrate 54 MG tablet, Take 1 tablet by mouth daily., Disp: , Rfl:  hyoscyamine (LEVBID) 0.375 MG 12 hr tablet, Take 0.375 mg by mouth as needed. For stomach cramps, Disp: , Rfl: ;  isosorbide mononitrate (IMDUR) 30 MG 24 hr tablet, Take 1 tablet (30 mg total) by mouth at bedtime., Disp: 90 tablet, Rfl: 3;  lactulose (CHRONULAC) 10 GM/15ML solution, 45 ml po once or  twice a day, Disp: 500 mL, Rfl: 3;  LINZESS 145 MCG CAPS, Take 1 capsule by mouth daily., Disp: , Rfl:  montelukast (SINGULAIR) 10 MG tablet, Take 1 tablet by mouth at bedtime., Disp: , Rfl: ;  morphine (MSIR) 30 MG tablet, Take 30 mg by mouth every 4 (four) hours as needed. FOR PAIN, Disp: , Rfl: ;  nitroGLYCERIN (NITROSTAT) 0.4 MG SL tablet, Place 1 tablet (0.4 mg total) under the tongue every 5 (five) minutes as needed for chest pain., Disp: 20 tablet, Rfl: 6 polyethylene glycol powder (MIRALAX) powder, Take 17 g by mouth daily as needed. , Disp: , Rfl: ;  potassium chloride SA (K-DUR,KLOR-CON) 20 MEQ tablet, Take 20 mEq by mouth daily., Disp: , Rfl: ;  pravastatin (PRAVACHOL) 40 MG tablet, Take 1 tablet by mouth daily., Disp: , Rfl: ;  promethazine (PHENERGAN) 25 MG tablet, Take 25 mg by mouth every 6 (six) hours as needed. FOR NAUSEA, Disp: , Rfl:  tiotropium (SPIRIVA) 18 MCG inhalation capsule, Place 18 mcg into inhaler and inhale daily., Disp: , Rfl: ;  valsartan-hydrochlorothiazide (DIOVAN-HCT) 320-12.5 MG per tablet, Take 1 tablet by mouth daily., Disp: 30 tablet, Rfl: 6;  furosemide (LASIX) 40 MG tablet, Take 40 mg by mouth. Taking 80-120 mg daily as needed. Increases as his weight goes up with fluid, Disp: , Rfl:   Immunizations: reviewed/updated in Epic  ROS: As above  in the HPI. All other systems are stable or negative.  OBJECTIVE: APPEARANCE:  Patient in no acute distress.The patient appeared well nourished and normally developed. Acyanotic. Waist: VITAL SIGNS:BP 172/98  Pulse 72  Temp(Src) 97.8 F (36.6 C) (Oral)  Wt 234 lb 9.6 oz (106.414 kg)  BMI 30.96 kg/m2 bp 160/95 at 5:10 pm Obese WM SKIN: warm and  Dry without overt rashes, tattoos and scars  HEAD and Neck: without JVD, Head and scalp: normal Eyes:No scleral icterus. Fundi normal, eye movements normal. Ears: Auricle normal, canal normal, Tympanic membranes normal, insufflation normal. Nose: normal Throat:  normal Neck & thyroid: normal  CHEST & LUNGS: Chest wall: normal Lungs: Clear  CVS: Reveals the PMI to be normally located. Regular rhythm, First and Second Heart sounds are normal,  absence of murmurs, rubs or gallops. Peripheral vasculature: Radial pulses: normal Dorsal pedis pulses: normal Posterior pulses: normal  ABDOMEN:  Appearance: normal Benign,, no organomegaly, no masses, no Abdominal Aortic enlargement. No Guarding , no rebound. No Bruits. Bowel sounds: normal  RECTAL: N/A GU: N/A  EXTREMETIES: edema 1+.  MUSCULOSKELETAL:  Spine: normal Ambulates with a walking stick  NEUROLOGIC: oriented to time,place and person; nonfocal.  ASSESSMENT: HTN (hypertension) - Plan: BASIC METABOLIC PANEL WITH GFR  SOB (shortness of breath)  PULMONARY NODULE  PERIPHERAL NEUROPATHY: ? Agent Orange related, also has DM-2  OSTEOARTHRITIS  NEPHROLITHIASIS, HX OF  Mild aortic stenosis by prior echocardiogram  HYPERLIPIDEMIA  GLUCOSE INTOLERANCE  GERD  ASTHMA, WITH ACUTE EXACERBATION  PLAN: Orders Placed This Encounter  Procedures  . BASIC METABOLIC PANEL WITH GFR   No orders of the defined types were placed in this encounter.   Patient was given clonidine 0.1mg  here in the clinic to lower the BP with very good response and his headache  Improved.  incease the coreg to 25 mg twice a day. He has enough to increase the dose until next week  Return in 7 days (on 11/08/2012) for recheck BP.  Tarnesha Ulloa P. [place] Charon, M.D.   Return in 7 days (on 11/08/2012) for recheck BP.

## 2012-11-02 ENCOUNTER — Telehealth: Payer: Self-pay | Admitting: Cardiology

## 2012-11-02 DIAGNOSIS — I10 Essential (primary) hypertension: Secondary | ICD-10-CM

## 2012-11-02 LAB — BASIC METABOLIC PANEL WITH GFR
BUN: 16 mg/dL (ref 6–23)
CO2: 25 mEq/L (ref 19–32)
Calcium: 10.3 mg/dL (ref 8.4–10.5)
Chloride: 98 mEq/L (ref 96–112)
Creat: 0.82 mg/dL (ref 0.50–1.35)
GFR, Est African American: >89 mL/min
GFR, Est Non African American: >89 mL/min
Glucose, Bld: 93 mg/dL (ref 70–99)
Potassium: 3.7 mEq/L (ref 3.5–5.3)
Sodium: 138 mEq/L (ref 135–145)

## 2012-11-02 NOTE — Telephone Encounter (Signed)
Returned call.  Pt stated Dr. Herbie Baltimore put him on Imdur and he took it 3 nights in a row.  Stated he had the same symptoms of n/v and severe HA each time.  Stated yesterday at Dr. Nash Dimmer office BP was 172/98 and 175/102.  Stated he told him to stop the Imdur and changed the other med.  Stated he still has some short-acting NTG.  Stated Dr. [place] Charon said Dr. Herbie Baltimore would be able to see his note.  Reviewed note and it appears pt was given Clonidine in office, Coreg increased to 25mg  daily and pt is to return next week for BP check.  Pt advised to follow Dr. Nash Dimmer advice and informed Dr. Herbie Baltimore will be notified.  Pt informed Dr. Herbie Baltimore will be able to view the note and if he has other advice, he will receive a call back.  Pt verbalized understanding and agreed w/ plan.

## 2012-11-02 NOTE — Telephone Encounter (Signed)
Tell him that increasing Coreg is fine.   OK to hold Imdur if he "can't tolerate it"   I would like to order Bilateral Renal Artery Dopplers -- this increase in BP is relatively new.  Marykay Lex, MD

## 2012-11-02 NOTE — Telephone Encounter (Signed)
Returned call and informed pt per instructions by MD/PA.  Pt verbalized understanding and agreed w/ plan.  Pt will await call from scheduling for appt.  Order placed and forwarded to scheduling.

## 2012-11-02 NOTE — Telephone Encounter (Signed)
BP is up-His GP had to give him another pill yesterday because it was 170/92-He is taking Imdur-please call to advise!

## 2012-11-02 NOTE — Progress Notes (Signed)
Quick Note:  Call patient. Labs normal. No change in plan. ______ 

## 2012-11-03 ENCOUNTER — Other Ambulatory Visit: Payer: Medicare Other

## 2012-11-03 DIAGNOSIS — J42 Unspecified chronic bronchitis: Secondary | ICD-10-CM

## 2012-11-05 NOTE — Assessment & Plan Note (Addendum)
There is probably a reactive component. He has had a lot of trauma. I'm not sure how much active lung disease he has now. Plan-schedule PFT

## 2012-11-05 NOTE — Assessment & Plan Note (Signed)
Probably old granulomatous disease. Plan-TB assay

## 2012-11-07 LAB — QUANTIFERON TB GOLD ASSAY (BLOOD)
Interferon Gamma Release Assay: NEGATIVE
TB Ag value: 0.03 IU/mL

## 2012-11-08 ENCOUNTER — Ambulatory Visit: Payer: Medicare Other | Admitting: Family Medicine

## 2012-11-08 ENCOUNTER — Telehealth: Payer: Self-pay | Admitting: Family Medicine

## 2012-11-08 NOTE — Telephone Encounter (Signed)
Spoke with pt lab result given  pt stated bp fluctuating.  appt for 11-09-12 w/ fpw

## 2012-11-09 ENCOUNTER — Telehealth: Payer: Self-pay | Admitting: Family Medicine

## 2012-11-09 ENCOUNTER — Encounter: Payer: Self-pay | Admitting: Family Medicine

## 2012-11-09 ENCOUNTER — Ambulatory Visit (INDEPENDENT_AMBULATORY_CARE_PROVIDER_SITE_OTHER): Payer: Medicare Other | Admitting: Family Medicine

## 2012-11-09 ENCOUNTER — Emergency Department (HOSPITAL_COMMUNITY)
Admission: EM | Admit: 2012-11-09 | Discharge: 2012-11-09 | Disposition: A | Discharge status: Home / Self Care | Payer: Medicare Other | Attending: Emergency Medicine | Admitting: Emergency Medicine

## 2012-11-09 ENCOUNTER — Other Ambulatory Visit: Payer: Self-pay

## 2012-11-09 ENCOUNTER — Emergency Department (HOSPITAL_COMMUNITY): Payer: Medicare Other

## 2012-11-09 ENCOUNTER — Encounter (HOSPITAL_COMMUNITY): Payer: Self-pay | Admitting: Emergency Medicine

## 2012-11-09 VITALS — BP 124/72 | HR 59 | Temp 97.6°F | Wt 241.2 lb

## 2012-11-09 DIAGNOSIS — Z91040 Latex allergy status: Secondary | ICD-10-CM | POA: Insufficient documentation

## 2012-11-09 DIAGNOSIS — Z860100 Personal history of colon polyps, unspecified: Secondary | ICD-10-CM | POA: Insufficient documentation

## 2012-11-09 DIAGNOSIS — Z87828 Personal history of other (healed) physical injury and trauma: Secondary | ICD-10-CM | POA: Insufficient documentation

## 2012-11-09 DIAGNOSIS — Z8719 Personal history of other diseases of the digestive system: Secondary | ICD-10-CM | POA: Insufficient documentation

## 2012-11-09 DIAGNOSIS — K5900 Constipation, unspecified: Secondary | ICD-10-CM

## 2012-11-09 DIAGNOSIS — Z87442 Personal history of urinary calculi: Secondary | ICD-10-CM | POA: Insufficient documentation

## 2012-11-09 DIAGNOSIS — E869 Volume depletion, unspecified: Secondary | ICD-10-CM

## 2012-11-09 DIAGNOSIS — Z8673 Personal history of transient ischemic attack (TIA), and cerebral infarction without residual deficits: Secondary | ICD-10-CM | POA: Insufficient documentation

## 2012-11-09 DIAGNOSIS — M549 Dorsalgia, unspecified: Secondary | ICD-10-CM | POA: Insufficient documentation

## 2012-11-09 DIAGNOSIS — J45901 Unspecified asthma with (acute) exacerbation: Secondary | ICD-10-CM

## 2012-11-09 DIAGNOSIS — R109 Unspecified abdominal pain: Secondary | ICD-10-CM

## 2012-11-09 DIAGNOSIS — Z79899 Other long term (current) drug therapy: Secondary | ICD-10-CM | POA: Insufficient documentation

## 2012-11-09 DIAGNOSIS — K5909 Other constipation: Secondary | ICD-10-CM

## 2012-11-09 DIAGNOSIS — R300 Dysuria: Secondary | ICD-10-CM | POA: Insufficient documentation

## 2012-11-09 DIAGNOSIS — G40909 Epilepsy, unspecified, not intractable, without status epilepticus: Secondary | ICD-10-CM | POA: Insufficient documentation

## 2012-11-09 DIAGNOSIS — R112 Nausea with vomiting, unspecified: Secondary | ICD-10-CM | POA: Insufficient documentation

## 2012-11-09 DIAGNOSIS — E119 Type 2 diabetes mellitus without complications: Secondary | ICD-10-CM

## 2012-11-09 DIAGNOSIS — I10 Essential (primary) hypertension: Secondary | ICD-10-CM

## 2012-11-09 DIAGNOSIS — Z8669 Personal history of other diseases of the nervous system and sense organs: Secondary | ICD-10-CM | POA: Insufficient documentation

## 2012-11-09 DIAGNOSIS — F0390 Unspecified dementia without behavioral disturbance: Secondary | ICD-10-CM | POA: Insufficient documentation

## 2012-11-09 DIAGNOSIS — Z8679 Personal history of other diseases of the circulatory system: Secondary | ICD-10-CM

## 2012-11-09 DIAGNOSIS — R079 Chest pain, unspecified: Secondary | ICD-10-CM

## 2012-11-09 DIAGNOSIS — J4489 Other specified chronic obstructive pulmonary disease: Secondary | ICD-10-CM | POA: Insufficient documentation

## 2012-11-09 DIAGNOSIS — F3289 Other specified depressive episodes: Secondary | ICD-10-CM | POA: Insufficient documentation

## 2012-11-09 DIAGNOSIS — K263 Acute duodenal ulcer without hemorrhage or perforation: Secondary | ICD-10-CM

## 2012-11-09 DIAGNOSIS — F329 Major depressive disorder, single episode, unspecified: Secondary | ICD-10-CM | POA: Insufficient documentation

## 2012-11-09 DIAGNOSIS — Z9861 Coronary angioplasty status: Secondary | ICD-10-CM | POA: Insufficient documentation

## 2012-11-09 DIAGNOSIS — M1710 Unilateral primary osteoarthritis, unspecified knee: Secondary | ICD-10-CM

## 2012-11-09 DIAGNOSIS — IMO0002 Reserved for concepts with insufficient information to code with codable children: Secondary | ICD-10-CM

## 2012-11-09 DIAGNOSIS — K56609 Unspecified intestinal obstruction, unspecified as to partial versus complete obstruction: Secondary | ICD-10-CM

## 2012-11-09 DIAGNOSIS — Z8249 Family history of ischemic heart disease and other diseases of the circulatory system: Secondary | ICD-10-CM

## 2012-11-09 DIAGNOSIS — J309 Allergic rhinitis, unspecified: Secondary | ICD-10-CM

## 2012-11-09 DIAGNOSIS — K222 Esophageal obstruction: Secondary | ICD-10-CM

## 2012-11-09 DIAGNOSIS — E785 Hyperlipidemia, unspecified: Secondary | ICD-10-CM

## 2012-11-09 DIAGNOSIS — Z8739 Personal history of other diseases of the musculoskeletal system and connective tissue: Secondary | ICD-10-CM | POA: Insufficient documentation

## 2012-11-09 DIAGNOSIS — G609 Hereditary and idiopathic neuropathy, unspecified: Secondary | ICD-10-CM

## 2012-11-09 DIAGNOSIS — Z862 Personal history of diseases of the blood and blood-forming organs and certain disorders involving the immune mechanism: Secondary | ICD-10-CM | POA: Insufficient documentation

## 2012-11-09 DIAGNOSIS — J984 Other disorders of lung: Secondary | ICD-10-CM

## 2012-11-09 DIAGNOSIS — J449 Chronic obstructive pulmonary disease, unspecified: Secondary | ICD-10-CM | POA: Insufficient documentation

## 2012-11-09 DIAGNOSIS — J4521 Mild intermittent asthma with (acute) exacerbation: Secondary | ICD-10-CM

## 2012-11-09 DIAGNOSIS — Z7982 Long term (current) use of aspirin: Secondary | ICD-10-CM | POA: Insufficient documentation

## 2012-11-09 LAB — CBC WITH DIFFERENTIAL/PLATELET
Eosinophils Absolute: 0.3 10*3/uL (ref 0.0–0.7)
Lymphocytes Relative: 28 % (ref 12–46)
Lymphs Abs: 1.4 10*3/uL (ref 0.7–4.0)
MCH: 29.7 pg (ref 26.0–34.0)
Neutrophils Relative %: 54 % (ref 43–77)
Platelets: 245 10*3/uL (ref 150–400)
RBC: 4.58 MIL/uL (ref 4.22–5.81)
WBC: 5 10*3/uL (ref 4.0–10.5)

## 2012-11-09 LAB — COMPREHENSIVE METABOLIC PANEL
ALT: 20 U/L (ref 0–53)
AST: 22 U/L (ref 0–37)
Alkaline Phosphatase: 92 U/L (ref 39–117)
GFR calc Af Amer: >90 mL/min (ref >90)
Glucose, Bld: 119 mg/dL — ABNORMAL HIGH (ref 70–99)
Potassium: 3.9 mEq/L (ref 3.5–5.1)
Sodium: 137 mEq/L (ref 135–145)
Total Protein: 7.9 g/dL (ref 6.0–8.3)

## 2012-11-09 LAB — URINALYSIS, ROUTINE W REFLEX MICROSCOPIC
Bilirubin Urine: NEGATIVE
Hgb urine dipstick: NEGATIVE
Ketones, ur: NEGATIVE mg/dL
Specific Gravity, Urine: 1.01 (ref 1.005–1.030)
pH: 5.5 (ref 5.0–8.0)

## 2012-11-09 MED ORDER — ONDANSETRON HCL 4 MG/2ML IJ SOLN
4.0000 mg | Freq: Once | INTRAMUSCULAR | Status: AC
  Administered 2012-11-09: 4 mg via INTRAVENOUS
  Filled 2012-11-09: qty 2

## 2012-11-09 MED ORDER — KETOROLAC TROMETHAMINE 30 MG/ML IJ SOLN
30.0000 mg | Freq: Once | INTRAMUSCULAR | Status: AC
  Administered 2012-11-09: 30 mg via INTRAVENOUS
  Filled 2012-11-09: qty 1

## 2012-11-09 MED ORDER — SODIUM CHLORIDE 0.9 % IV BOLUS (SEPSIS)
1000.0000 mL | Freq: Once | INTRAVENOUS | Status: AC
  Administered 2012-11-09: 1000 mL via INTRAVENOUS

## 2012-11-09 NOTE — ED Notes (Addendum)
Pt c/o burning urination and right flank pain/n/v since Sunday. Pt also reports syncopal episode at md office pta.

## 2012-11-09 NOTE — Telephone Encounter (Signed)
Pt admitted to hospital and left message would rx after discharge from hosp

## 2012-11-09 NOTE — ED Notes (Signed)
Patient with no complaints at this time. Respirations even and unlabored. Skin warm/dry. Discharge instructions reviewed with patient at this time. Patient given opportunity to voice concerns/ask questions. IV removed per policy and band-aid applied to site. Patient discharged at this time and left Emergency Department with steady gait.  
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
## 2012-11-09 NOTE — Progress Notes (Signed)
Patient ID: [name], male   DOB: [birth year], 62 y.o.   MRN: [id number] SUBJECTIVE: CC: Chief Complaint  Patient presents with  . Hypertension    BP fluctuating  . Allergic Rhinitis   . Cough  . Back Pain    HPI: Work in today because of pain in the right flank, and BP fluctuations. Dr Herbie Baltimore is  schdeuling a renal Ultrasound. BP drops to 70s/50s, and then it goes up. Dr Herbie Baltimore is supposedly concerned about his renal arteries. Awaiting appointment for scan. Symptoms since Monday with the right flank pain., nausea and vomiting. Unable to keep fluids down. Wife found him weak and he slumped over when he was sitting up. Patient h as had a fkare of his allergies and coughing and  Wheezing as well.  PMH/PSH: reviewed/updated in Epic  SH/FH: reviewed/updated in Epic  Allergies: reviewed/updated in Epic  Medications: reviewed/updated in Epic  Immunizations: reviewed/updated in Epic  ROS: As above in the HPI. All other systems are stable or negative.  OBJECTIVE: APPEARANCE:  Patient in no acute distress.The patient appeared well nourished and normally developed. Acyanotic. Waist: VITAL SIGNS:BP 124/72  Pulse 59  Temp(Src) 97.6 F (36.4 C)  Wt 241 lb 3.2 oz (109.408 kg)  BMI 31.83 kg/m2  WM RR=24  Orthostatic BP 145/80 lying flat BP 125/65 sitting up and patient got dizzy and orthostatic and fell back unto exam table. Patient placed flat on table and he felt better  SKIN: warm and  Dry without overt rashes,  HEAD and Neck: without JVD, Head and scalp: normal Eyes:No scleral icterus. Fundi normal, eye movements normal. Ears: Auricle normal, canal normal, Tympanic membranes normal, insufflation normal. Nose: normal Throat: tongue dry. Mouth very dry Neck & thyroid: normal  CHEST & LUNGS: Chest wall: normal Lungs: scatter few wheezes.  CVS: Reveals the PMI to be normally located. Regular rhythm, First and Second Heart sounds are normal,  2/6 very soft  Murmur.  no rubs or gallops. Peripheral vasculature: Radial pulses: normal  ABDOMEN:  Appearance:obese distended and scarred. Benign, no organomegaly, no masses, no Abdominal Aortic enlargement. No Guarding , no rebound. No Bruits. Bowel sounds: normal No CVA tenderness today  RECTAL: N/A GU: N/A  EXTREMETIES: nonedematous.  MUSCULOSKELETAL:  Spine:scar Joints: intact  NEUROLOGIC: oriented to time,place and person; nonfocal. Cranial Nerves are normal.  ASSESSMENT: Volume depletion  NAUSEA AND VOMITING  NEPHROLITHIASIS, HX OF  Asthma with bronchitis, mild intermittent, with acute exacerbation  SBO - history of SBO  PULMONARY NODULE  PERIPHERAL NEUROPATHY: ? Agent Orange related, also has DM-2  Constipation, chronic  ESOPHAGEAL STRICTURE  HYPERTENSION - very labile  HYPERLIPIDEMIA  ACUT DUOD ULCER W/O MENTION HEMORR PERF/OBST - history of DU  DIABETES MELLITUS, TYPE II  DEGENERATIVE JOINT DISEASE, LEFT KNEE  Family history of acute myocardial infarction. and premature CAD  ALLERGIC RHINITIS  Abdominal pain  CEREBROVASCULAR ACCIDENT, HX OF  Chest pain with low risk for cardiac etiology   PLAN: IV. N/S Transfer to [facility] by ambulance for further evaluation and treatment for his orthostasis and volume Depletion from vomiting and right flank abdominal pain. Charge nurse at [facility] ED informed. Pulse Oximetry.  Daniela Hernan P. Modesto Charon, M.D.

## 2012-11-10 NOTE — Progress Notes (Signed)
Quick Note:  LMOM TCB x1. ______ 

## 2012-11-11 ENCOUNTER — Telehealth: Payer: Self-pay | Admitting: Internal Medicine

## 2012-11-11 NOTE — Telephone Encounter (Signed)
Notes Recorded by Waymon Budge, MD on 11/07/2012 at 8:49 PM TB test negative for any sign of past or present TB infection.   lmomtcb x1--i do not see where pt had recent CT

## 2012-11-11 NOTE — Telephone Encounter (Signed)
Pt called back and he is aware of lab results per CY.  Pt stated that the ct was done at the ER and the results were given to the pt in the ER. Pt is aware of appt on 7/18 for PFT at 1 and appt with CY at 2pm.  Nothing further is needed.

## 2012-11-18 ENCOUNTER — Telehealth: Payer: Self-pay | Admitting: Family Medicine

## 2012-11-18 NOTE — Telephone Encounter (Signed)
APPT MADE FOR MON WITH MMM (PER WONG)

## 2012-11-21 ENCOUNTER — Ambulatory Visit: Payer: Medicare Other | Admitting: Nurse Practitioner

## 2012-11-21 ENCOUNTER — Telehealth: Payer: Self-pay | Admitting: Family Medicine

## 2012-11-21 NOTE — Telephone Encounter (Signed)
appt made

## 2012-11-21 NOTE — Telephone Encounter (Signed)
Please make appointment.

## 2012-12-06 ENCOUNTER — Encounter: Payer: Self-pay | Admitting: Family Medicine

## 2012-12-06 ENCOUNTER — Ambulatory Visit (INDEPENDENT_AMBULATORY_CARE_PROVIDER_SITE_OTHER): Payer: Medicare Other | Admitting: Family Medicine

## 2012-12-06 VITALS — BP 162/84 | HR 74 | Temp 97.5°F | Wt 235.0 lb

## 2012-12-06 DIAGNOSIS — K56609 Unspecified intestinal obstruction, unspecified as to partial versus complete obstruction: Secondary | ICD-10-CM

## 2012-12-06 DIAGNOSIS — L2089 Other atopic dermatitis: Secondary | ICD-10-CM

## 2012-12-06 DIAGNOSIS — E119 Type 2 diabetes mellitus without complications: Secondary | ICD-10-CM

## 2012-12-06 DIAGNOSIS — I10 Essential (primary) hypertension: Secondary | ICD-10-CM

## 2012-12-06 DIAGNOSIS — J984 Other disorders of lung: Secondary | ICD-10-CM

## 2012-12-06 DIAGNOSIS — Z87442 Personal history of urinary calculi: Secondary | ICD-10-CM

## 2012-12-06 DIAGNOSIS — R109 Unspecified abdominal pain: Secondary | ICD-10-CM

## 2012-12-06 DIAGNOSIS — M1990 Unspecified osteoarthritis, unspecified site: Secondary | ICD-10-CM

## 2012-12-06 DIAGNOSIS — L209 Atopic dermatitis, unspecified: Secondary | ICD-10-CM

## 2012-12-06 DIAGNOSIS — G609 Hereditary and idiopathic neuropathy, unspecified: Secondary | ICD-10-CM

## 2012-12-06 DIAGNOSIS — E785 Hyperlipidemia, unspecified: Secondary | ICD-10-CM

## 2012-12-06 LAB — COMPLETE METABOLIC PANEL WITH GFR
ALT: 19 U/L (ref 0–53)
AST: 18 U/L (ref 0–37)
Albumin: 4.5 g/dL (ref 3.5–5.2)
Alkaline Phosphatase: 82 U/L (ref 39–117)
BUN: 14 mg/dL (ref 6–23)
CO2: 28 mEq/L (ref 19–32)
Calcium: 9.9 mg/dL (ref 8.4–10.5)
Chloride: 102 mEq/L (ref 96–112)
Creat: 0.93 mg/dL (ref 0.50–1.35)
GFR, Est African American: >89 mL/min
GFR, Est Non African American: 88 mL/min
Glucose, Bld: 98 mg/dL (ref 70–99)
Potassium: 4.1 mEq/L (ref 3.5–5.3)
Sodium: 137 mEq/L (ref 135–145)
Total Bilirubin: 1.4 mg/dL — ABNORMAL HIGH (ref 0.3–1.2)
Total Protein: 7.7 g/dL (ref 6.0–8.3)

## 2012-12-06 LAB — POCT CBC
Granulocyte percent: 65 %G (ref 37–80)
HCT, POC: 39.8 % — AB (ref 43.5–53.7)
Hemoglobin: 14.5 g/dL (ref 14.1–18.1)
Lymph, poc: 1.6 (ref 0.6–3.4)
MCH, POC: 31 pg (ref 27–31.2)
MCHC: 36.5 g/dL — AB (ref 31.8–35.4)
MCV: 84.9 fL (ref 80–97)
MPV: 7.6 fL (ref 0–99.8)
POC Granulocyte: 3.5 (ref 2–6.9)
POC LYMPH PERCENT: 30.4 %L (ref 10–50)
Platelet Count, POC: 287 10*3/uL (ref 142–424)
RBC: 4.7 M/uL (ref 4.69–6.13)
RDW, POC: 13.3 %
WBC: 5.4 10*3/uL (ref 4.6–10.2)

## 2012-12-06 LAB — LIPASE: Lipase: <10 U/L (ref 0–75)

## 2012-12-06 LAB — AMYLASE: Amylase: 13 U/L (ref 0–105)

## 2012-12-06 MED ORDER — TRIAMCINOLONE ACETONIDE 0.5 % EX OINT: TOPICAL_OINTMENT | Freq: Two times a day (BID) | CUTANEOUS | Status: DC

## 2012-12-06 NOTE — Progress Notes (Signed)
Patient ID: CON ARGANBRIGHT, male   DOB: 1951-04-25, 62 y.o.   MRN: [id number] SUBJECTIVE: CC: Chief Complaint  Patient presents with  . Follow-up    ck bp and c/o legs swelling and rash on hands  stated bowels got "blocked" but didnt go to hosp.   . Medication Refill    needs higher dose prednisone cream  rx coreg dosage    HPI: Patient is here for follow up of hypertension: denies Headache;deniesChest Pain;denies weakness;denies Shortness of Breath or Orthopnea;denies Visual changes;denies palpitations;denies cough;denies pedal edema;denies symptoms of TIA or stroke; admits to Compliance with medications denies Problems with medications.  Rash of the hands and legs breaks out and gets better after a couple days.  Also, felt his SBO occurred last week. Resolved. He is okay now. He did not want to go to the ED and hospital and basically toughed it out at home. He also had RUQ abdominal pain. Which is almost resolved. Last Bm 2 days ago.   Past Medical History  Diagnosis Date  . Diabetes mellitus   . Dyslipidemia (high LDL; low HDL)     statin intolerant; on fibrate  . Hypertension     very labile  . GERD (gastroesophageal reflux disease)   . Asthma   . Stroke   . Colon polyps   . Anemia   . Depression   . Osteoarthritis   . Clotting disorder     LLE DVT (also small vessel)  . COPD (chronic obstructive pulmonary disease)   . Neuromuscular disorder     Agent orange; Peripheral Neuroapthy  . Hearing loss   . Fainting   . Weakness   . Small bowel obstruction 2001    s/p multiple bowel surgeries; from war wounds  . Seizure   . Right shoulder injury   . Left knee injury   . Back pain, chronic, followed at pain clinic 11/25/2011  . Family history of acute myocardial infarction. and premature CAD 11/25/2011  . Nephrolithiasis   . Mild aortic stenosis by prior echocardiogram 03/2012    moderate Sclerosis.  . Chest pain with low risk for cardiac etiology 10/2011     Non-obstructive CAD by Cath; negative Lexiscan in 08/2011  . Edema leg     Wears compression stockings; 08/2012 dopplers - no DVT or thrombophlebitis; mild R Popliteal V reflux - no VNUS ablation candidate  . Dementia     Mild   Past Surgical History  Procedure Laterality Date  . Joint replacement      right knee replaced 2x  . Colonoscopy    . Exploratory laparotomy with extensive lysis of adhesions  10/1999  . Small bowel obstruction  1996  . Partial small bowel obstruction  1999  . Lexiscan myoview  08/2011    Negative  . Doppler echocardiography  2012    Essentially normal wirth moderate concentric LVH and moderate aortic sclerosis   . Cardiac catheterization  11/26/2011    Normal  . Total knee arthroplasty     History   Social History  . Marital Status: Married    Spouse Name: N/A    Number of Children: 3  . Years of Education: N/A   Occupational History  . Disabled    Social History Main Topics  . Smoking status: Never Smoker   . Smokeless tobacco: Never Used  . Alcohol Use: No  . Drug Use: No  . Sexually Active: Not on file   Other Topics Concern  . Not on  file   Social History Narrative   He is a father of 3, grandfather 5. One of his sons was killed during the 4 in [place]. 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 He was exposed to [employer].   Not very physically active bed due to extreme disability/debilitation from osteoarthritis and peripheral neuropathy.   Family History  Problem Relation Age of Onset  . Stroke Mother   . Heart disease Mother   . Heart attack Mother   . Heart disease Father   . Heart attack Father   . Heart attack Sister   . Heart attack Brother   . Hypertension Brother   . Heart attack Son   . Stroke Son   . Hypertension Brother   . Sleep apnea Son   . Non-Hodgkin's lymphoma Daughter    Current Outpatient Prescriptions on File Prior to Visit  Medication Sig Dispense Refill  . albuterol  (PROVENTIL HFA) 108 (90 BASE) MCG/ACT inhaler Inhale 2 puffs into the lungs every 6 (six) hours as needed. For wheezing and/or shortness of breath       . ALPRAZolam (XANAX) 1 MG tablet Take 1 mg by mouth every 6 (six) hours. Take one tablet every 6 to 8 hours and take one tablet at bedtime      . aspirin 81 MG chewable tablet Chew 81 mg by mouth daily.      . beclomethasone (BECONASE-AQ) 42 MCG/SPRAY nasal spray Place 2 sprays into the nose 2 (two) times daily. Dose is for each nostril.      . carvedilol (COREG) 25 MG tablet Take 25 mg by mouth 2 (two) times daily with a meal.      . fenofibrate 54 MG tablet Take 1 tablet by mouth daily.      . furosemide (LASIX) 40 MG tablet Take 40 mg by mouth. Taking 80-120 mg daily as needed. Increases as his weight goes up with fluid      . hyoscyamine (LEVBID) 0.375 MG 12 hr tablet Take 0.375 mg by mouth as needed. For stomach cramps      . LINZESS 145 MCG CAPS Take 1 capsule by mouth daily.      . montelukast (SINGULAIR) 10 MG tablet Take 1 tablet by mouth at bedtime.      [name] morphine (MSIR) 30 MG tablet Take 30 mg by mouth every 4 (four) hours as needed. FOR PAIN      . nitroGLYCERIN (NITROSTAT) 0.4 MG SL tablet Place 1 tablet (0.4 mg total) under the tongue every 5 (five) minutes as needed for chest pain.  20 tablet  6  . potassium chloride SA (K-DUR,KLOR-CON) 20 MEQ tablet Take 20 mEq by mouth daily as needed (for fluid rentention).       . pravastatin (PRAVACHOL) 40 MG tablet Take 1 tablet by mouth daily.      . promethazine (PHENERGAN) 25 MG tablet Take 25 mg by mouth every 6 (six) hours as needed. FOR NAUSEA      . tiotropium (SPIRIVA) 18 MCG inhalation capsule Place 18 mcg into inhaler and inhale daily.      . valsartan-hydrochlorothiazide (DIOVAN-HCT) 320-12.5 MG per tablet Take 1 tablet by mouth daily.  30 tablet  6   No current facility-administered medications on file prior to visit.   Allergies  Allergen Reactions  . Ivp Dye (Iodinated  Diagnostic Agents)     Omni - Paque Contrast   . Aspirin   . Atorvastatin  REACTION: myalgias  . Butorphanol Tartrate   . Bystolic (Nebivolol Hcl)     Nightmares, flashbacks  . Codeine   . Doxycycline   . Iohexol      Desc: hives,neck and torso erythemia   . Latex   . Methocarbamol     rash  . Morphine And Related     Pt is on out patient morphine not allergic to morphine  . Oxycodone Hcl   . Pentazocine Lactate     rash  . Succinylcholine Chloride   . Synvisc (Hylan G-F 20)     rash  . Dilaudid (Hydromorphone Hcl) Nausea Only and Rash    Aggressive    Immunization History  Administered Date(s) Administered  . Influenza Split 03/01/2012  . Influenza Whole 05/04/2005  . Pneumococcal Polysaccharide 06/01/2005, 06/02/2011   Prior to Admission medications   Medication Sig Start Date End Date Taking? Authorizing Provider  albuterol (PROVENTIL HFA) 108 (90 BASE) MCG/ACT inhaler Inhale 2 puffs into the lungs every 6 (six) hours as needed. For wheezing and/or shortness of breath     Historical Provider, MD  ALPRAZolam Prudy Feeler) 1 MG tablet Take 1 mg by mouth every 6 (six) hours. Take one tablet every 6 to 8 hours and take one tablet at bedtime    Historical Provider, MD  aspirin 81 MG chewable tablet Chew 81 mg by mouth daily.    Historical Provider, MD  beclomethasone (BECONASE-AQ) 42 MCG/SPRAY nasal spray Place 2 sprays into the nose 2 (two) times daily. Dose is for each nostril.    Historical Provider, MD  carvedilol (COREG) 25 MG tablet Take 25 mg by mouth 2 (two) times daily with a meal.    Historical Provider, MD  fenofibrate 54 MG tablet Take 1 tablet by mouth daily. 10/10/12   Historical Provider, MD  furosemide (LASIX) 40 MG tablet Take 40 mg by mouth. Taking 80-120 mg daily as needed. Increases as his weight goes up with fluid 12/19/10   Historical Provider, MD  hyoscyamine (LEVBID) 0.375 MG 12 hr tablet Take 0.375 mg by mouth as needed. For stomach cramps    Historical  Provider, MD  isosorbide mononitrate (IMDUR) 30 MG 24 hr tablet  10/25/12   Historical Provider, MD  LINZESS 145 MCG CAPS Take 1 capsule by mouth daily. 10/01/12   Historical Provider, MD  montelukast (SINGULAIR) 10 MG tablet Take 1 tablet by mouth at bedtime. 10/10/12   Historical Provider, MD  morphine (MSIR) 30 MG tablet Take 30 mg by mouth every 4 (four) hours as needed. FOR PAIN 02/13/11   Historical Provider, MD  nitroGLYCERIN (NITROSTAT) 0.4 MG SL tablet Place 1 tablet (0.4 mg total) under the tongue every 5 (five) minutes as needed for chest pain. 10/25/12   Marykay Lex, MD  potassium chloride SA (K-DUR,KLOR-CON) 20 MEQ tablet Take 20 mEq by mouth daily as needed (for fluid rentention).     Historical Provider, MD  pravastatin (PRAVACHOL) 40 MG tablet Take 1 tablet by mouth daily. 07/27/12   Historical Provider, MD  promethazine (PHENERGAN) 25 MG tablet Take 25 mg by mouth every 6 (six) hours as needed. FOR NAUSEA 02/13/11   Historical Provider, MD  tiotropium (SPIRIVA) 18 MCG inhalation capsule Place 18 mcg into inhaler and inhale daily.    Historical Provider, MD  valsartan-hydrochlorothiazide (DIOVAN-HCT) 320-12.5 MG per tablet Take 1 tablet by mouth daily. 10/18/12   Marykay Lex, MD     ROS: As above in the HPI. All other systems  are stable or negative.  OBJECTIVE: APPEARANCE:  Patient in no acute distress.The patient appeared well nourished and normally developed. Acyanotic. Waist: VITAL SIGNS:BP 162/84  Pulse 74  Temp(Src) 97.5 F (36.4 C) (Oral)  Wt 235 lb (106.595 kg)  BMI 31.01 kg/m2 WM obese  SKIN: warm and  Dry. Eczematous patches on the upper extremities, especially on the dorsum of the hands. Also on the distal legs and thighs.  HEAD and Neck: without JVD, Head and scalp: normal Eyes:No scleral icterus. Fundi normal, eye movements normal. Ears: Auricle normal, canal normal, Tympanic membranes normal, insufflation normal. Nose: normal Throat: normal Neck &  thyroid: normal  CHEST & LUNGS: Chest wall: normal Lungs: Clear  CVS: Reveals the PMI to be normally located. Regular rhythm, First and Second Heart sounds are normal,  absence of murmurs, rubs or gallops. Peripheral vasculature: Radial pulses: normal Dorsal pedis pulses: normal  ABDOMEN:  Appearance:obese, distended and  Soft. Benign, no organomegaly, no masses, no Abdominal Aortic enlargement. No Guarding , no rebound. No Bruits. Bowel sounds: normal  RECTAL: N/A GU: N/A  EXTREMETIES: nonedematous. Both Femoral and Pedal pulses are normal.  MUSCULOSKELETAL:  Spine: reduced ROM Joints: arthritic knees  NEUROLOGIC: oriented to time,place and person; nonfocal.  ASSESSMENT: Atopic dermatitis - Plan: triamcinolone ointment (KENALOG) 0.5 %  SBO - Plan: POCT CBC, COMPLETE METABOLIC PANEL WITH GFR, Lipase, Amylase  PERIPHERAL NEUROPATHY: ? Agent Orange related, also has DM-2  PULMONARY NODULE  OSTEOARTHRITIS  NEPHROLITHIASIS, HX OF  HYPERTENSION - very labile  HYPERLIPIDEMIA - Plan: NMR Lipoprofile with Lipids  DIABETES MELLITUS, TYPE II  Abdominal pain apparently patient had a partial SBO event that resolved. And so did the abdominal pain. Will observe for now. But work up labs. Patient did not want to see his  Surgeon.  PLAN: Orders Placed This Encounter  Procedures  . COMPLETE METABOLIC PANEL WITH GFR  . Lipase  . Amylase  . NMR Lipoprofile with Lipids  . POCT CBC   Meds ordered this encounter  Medications  . DISCONTD: DIOVAN HCT 320-12.5 MG per tablet    Sig:   . isosorbide mononitrate (IMDUR) 30 MG 24 hr tablet    Sig:   . triamcinolone ointment (KENALOG) 0.5 %    Sig: Apply topically 2 (two) times daily. For up to 10 days. Avoid the face.    Dispense:  30 g    Refill:  1   Trial of a Fleets enema for constipation.  Return in about 2 weeks (around 12/20/2012) for Recheck medical problems.  Orrie Schubert P. [place] Charon, M.D.

## 2012-12-07 LAB — NMR LIPOPROFILE WITH LIPIDS
Cholesterol, Total: 173 mg/dL (ref <200)
HDL Particle Number: 36.6 umol/L (ref >=30.5)
HDL Size: 9.5 nm (ref >=9.2)
HDL-C: 34 mg/dL — ABNORMAL LOW (ref >=40)
LDL (calc): 75 mg/dL (ref <100)
LDL Particle Number: 799 nmol/L (ref <1000)
LDL Size: 19.6 nm — ABNORMAL LOW (ref >20.5)
LP-IR Score: 64 — ABNORMAL HIGH (ref <=45)
Large HDL-P: 4.4 umol/L — ABNORMAL LOW (ref >=4.8)
Large VLDL-P: 21.6 nmol/L — ABNORMAL HIGH (ref <=2.7)
Small LDL Particle Number: 448 nmol/L (ref <=527)
Triglycerides: 318 mg/dL — ABNORMAL HIGH (ref <150)
VLDL Size: 54.2 nm — ABNORMAL HIGH (ref <=46.6)

## 2012-12-11 ENCOUNTER — Other Ambulatory Visit: Payer: Self-pay | Admitting: Family Medicine

## 2012-12-11 DIAGNOSIS — E785 Hyperlipidemia, unspecified: Secondary | ICD-10-CM

## 2012-12-11 MED ORDER — FENOFIBRATE 145 MG PO TABS: 54.0000 mg | ORAL_TABLET | Freq: Every day | ORAL | Status: DC

## 2012-12-16 ENCOUNTER — Ambulatory Visit: Payer: Medicare Other | Admitting: Internal Medicine

## 2012-12-21 ENCOUNTER — Telehealth: Payer: Self-pay | Admitting: *Deleted

## 2012-12-21 NOTE — Telephone Encounter (Signed)
[facility] CALLED AND SAID PT TOLD THEM HE WAS SUPPOSE TO BE ON COREG DOSE HIGHER THAN WHAT THEY HAVE ON FILE. THEY HAVE PT ON 12.5MG  BUT LAST NOTE IN EPIC HAS 25MG  BID. PLEASE REVIEW AND HAVE NURSE CALL [facility] AND LET THEM KNOW PLEASE. [phone]. THANKS.

## 2012-12-21 NOTE — Telephone Encounter (Signed)
I did increase the coreg to 25 mg twice a day 1 month ago.  [name], M.D.

## 2012-12-22 ENCOUNTER — Encounter: Payer: Self-pay | Admitting: Family Medicine

## 2012-12-22 ENCOUNTER — Ambulatory Visit (INDEPENDENT_AMBULATORY_CARE_PROVIDER_SITE_OTHER): Payer: Medicare Other | Admitting: Family Medicine

## 2012-12-22 VITALS — BP 156/76 | HR 68 | Temp 97.0°F | Wt 238.8 lb

## 2012-12-22 DIAGNOSIS — R109 Unspecified abdominal pain: Secondary | ICD-10-CM

## 2012-12-22 DIAGNOSIS — M1990 Unspecified osteoarthritis, unspecified site: Secondary | ICD-10-CM

## 2012-12-22 DIAGNOSIS — R0789 Other chest pain: Secondary | ICD-10-CM

## 2012-12-22 DIAGNOSIS — I10 Essential (primary) hypertension: Secondary | ICD-10-CM

## 2012-12-22 DIAGNOSIS — E119 Type 2 diabetes mellitus without complications: Secondary | ICD-10-CM

## 2012-12-22 NOTE — Telephone Encounter (Signed)
[facility] notified and informed them that the coreg had been increased to 25mg  bid
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
## 2012-12-27 ENCOUNTER — Encounter: Payer: Self-pay | Admitting: Cardiology

## 2013-01-03 ENCOUNTER — Telehealth: Payer: Self-pay | Admitting: Family Medicine

## 2013-01-03 NOTE — Telephone Encounter (Signed)
Pt aware of below message. 

## 2013-01-03 NOTE — Telephone Encounter (Signed)
See note

## 2013-01-03 NOTE — Telephone Encounter (Signed)
Pt thinks that the med coreg making him dizzy . States taking coreg 25 mg taking bid  States gets dizzy when bends over and gets back up .

## 2013-01-03 NOTE — Telephone Encounter (Signed)
Check his BP to see if it is low. And reduce back to his previous dose. Follow up in 1 week to recheck on BP.

## 2013-01-05 ENCOUNTER — Telehealth: Payer: Self-pay | Admitting: Cardiology

## 2013-01-05 NOTE — Addendum Note (Signed)
Addended by: Beecher Mcardle R on: 01/05/2013 02:14 PM   Modules accepted: Orders

## 2013-01-05 NOTE — Telephone Encounter (Signed)
--   I would be less concerned with mildly elevated BPs if his baseline BP is OK.  More concerned with potential lows.  If > 110 mmHg, that is OK.  Carvedilol (Coreg) is a BID medicine & should be taken that way -- once daily dosing will lead to ups & downs.   If BP was OK, but just dizzy with 25 mg bid, the reduce to 12.5 mg bid.  If on that dose, BP goes up, &HR ? 60, would increase Coreg to 18.75 mg bid.  I think Dr. Modesto Charon can handle this &too many cooks in the kitchen is trouble. --- maybe his renal dopplers will prove helpful.  Marykay Lex, MD

## 2013-01-05 NOTE — Telephone Encounter (Signed)
Returned call

## 2013-01-05 NOTE — Telephone Encounter (Signed)
Returned call.  Pt stated Dr. Modesto Charon has been trying to get his BP to calm down.  Stated he increased his Coreg to 25 mg twice daily.  Stated the night before last about 10 o'clock he started getting real dizzy.  Stated it has been going up high and then dropping.  Pt stated he was having more dizziness w/ the increased dose of Coreg.  Pt informed per chart, he spoke w/ the nurse at Dr. Nash Dimmer office and they are already working on controlling his BP.  Pt advised to follow instructions given by Dr. [facility] Dim[facility] office.  Pt stated Dr. Nash Dimmer nurse told him to go back to 25 mg daily.  Pt informed Dr. Herbie Baltimore will be notified.  Pt verbalized understanding and agreed w/ plan.  Pt also c/o cough and stated he will call Dr. Nash Dimmer office to see if they want to give him some prednisone for it. Pt advised he should f/u with Dr. Nash Dimmer office r/t his cough.  Verbalized understanding.  Message forwarded to Dr. Herbie Baltimore.  FYI... Pt taking Coreg 25 mg daily.

## 2013-01-05 NOTE — Telephone Encounter (Signed)
Returned call.  Pt informed per Dr. Herbie Baltimore to take Coreg BID (12.5 mg BID) instead of once daily as it may cause BP to go up and down.  Also informed pt that Dr. Herbie Baltimore thinks Dr. Modesto Charon can handle managing BP.  Pt verbalized understanding and agreed w/ plan.

## 2013-01-05 NOTE — Telephone Encounter (Signed)
Patient is still having problems with his BP---keeps going up and down.  Has renal doppler scheduled for 01/13/2013.  Patient also states that he is having some issues with dizziness.

## 2013-01-06 ENCOUNTER — Other Ambulatory Visit: Payer: Self-pay | Admitting: *Deleted

## 2013-01-06 ENCOUNTER — Encounter (HOSPITAL_COMMUNITY): Payer: Medicare Other

## 2013-01-06 MED ORDER — LINACLOTIDE 145 MCG PO CAPS: 145.0000 ug | ORAL_CAPSULE | Freq: Every day | ORAL | Status: DC

## 2013-01-10 ENCOUNTER — Other Ambulatory Visit: Payer: Self-pay

## 2013-01-10 MED ORDER — LINACLOTIDE 145 MCG PO CAPS: 145.0000 ug | ORAL_CAPSULE | Freq: Every day | ORAL | Status: DC

## 2013-01-11 ENCOUNTER — Encounter: Payer: Self-pay | Admitting: *Deleted

## 2013-01-11 NOTE — Progress Notes (Signed)
Pt called, he states that he feel more sob than usual. He has tried everything rx'd to him for this problem. He prefers to see FPW, but he's off today. Pt lives about 40 miles away and doesn't want to come in. I advised him that he needs to be seen by someone wether its [facility] or an urgent care. He verbalized understanding.

## 2013-01-13 ENCOUNTER — Encounter (HOSPITAL_COMMUNITY): Payer: Medicare Other

## 2013-01-18 ENCOUNTER — Telehealth: Payer: Self-pay | Admitting: Internal Medicine

## 2013-01-18 NOTE — Telephone Encounter (Signed)
I spoke with pt. He did not want to see MW. He is scheduled to see RA at 4:15. Nothing further needed.

## 2013-01-19 ENCOUNTER — Ambulatory Visit (INDEPENDENT_AMBULATORY_CARE_PROVIDER_SITE_OTHER)
Admission: RE | Admit: 2013-01-19 | Discharge: 2013-01-19 | Disposition: A | Discharge status: Home / Self Care | Payer: Medicare Other | Source: Ambulatory Visit | Attending: Pulmonary Disease | Admitting: Pulmonary Disease

## 2013-01-19 ENCOUNTER — Encounter: Payer: Self-pay | Admitting: Pulmonary Disease

## 2013-01-19 ENCOUNTER — Ambulatory Visit (INDEPENDENT_AMBULATORY_CARE_PROVIDER_SITE_OTHER): Payer: Medicare Other | Admitting: Pulmonary Disease

## 2013-01-19 VITALS — BP 130/78 | HR 67 | Temp 97.6°F | Ht 73.0 in | Wt 250.6 lb

## 2013-01-19 DIAGNOSIS — R0602 Shortness of breath: Secondary | ICD-10-CM

## 2013-01-19 DIAGNOSIS — R059 Cough, unspecified: Secondary | ICD-10-CM

## 2013-01-19 DIAGNOSIS — J441 Chronic obstructive pulmonary disease with (acute) exacerbation: Secondary | ICD-10-CM

## 2013-01-19 DIAGNOSIS — J45909 Unspecified asthma, uncomplicated: Secondary | ICD-10-CM

## 2013-01-19 DIAGNOSIS — R05 Cough: Secondary | ICD-10-CM

## 2013-01-19 MED ORDER — PREDNISONE 10 MG PO TABS: ORAL_TABLET | ORAL | Status: DC

## 2013-01-19 MED ORDER — BUDESONIDE-FORMOTEROL FUMARATE 160-4.5 MCG/ACT IN AERO: 2.0000 | INHALATION_SPRAY | Freq: Two times a day (BID) | RESPIRATORY_TRACT | Status: DC

## 2013-01-19 MED ORDER — FUROSEMIDE 40 MG PO TABS: 40.0000 mg | ORAL_TABLET | Freq: Every day | ORAL | Status: DC

## 2013-01-19 MED ORDER — LEVOFLOXACIN 500 MG PO TABS: 500.0000 mg | ORAL_TABLET | Freq: Every day | ORAL | Status: DC

## 2013-01-19 MED ORDER — METHYLPREDNISOLONE ACETATE 80 MG/ML IJ SUSP
120.0000 mg | Freq: Once | INTRAMUSCULAR | Status: AC
  Administered 2013-01-19: 120 mg via INTRAMUSCULAR

## 2013-01-19 MED ORDER — ALBUTEROL SULFATE (2.5 MG/3ML) 0.083% IN NEBU
2.5000 mg | INHALATION_SOLUTION | Freq: Once | RESPIRATORY_TRACT | Status: AC
  Administered 2013-01-19: 2.5 mg via RESPIRATORY_TRACT

## 2013-01-19 NOTE — Patient Instructions (Addendum)
Depomedrol 120 mg IM given Prednisone 10 mg tabs Take 4 tabs  daily with food x 4 days, then 3 tabs daily x 4 days, then 2 tabs daily x 4 days, then 1 tab daily x4 days then stop. #40 Use albuterol nebs upto 4 times day as needed Trial of symbicort 160 2 puffs twice daily Take lasix 40 mg daily x 7  CXR today Levaquin x 7ds
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
## 2013-01-19 NOTE — Assessment & Plan Note (Addendum)
Treat as acute flare due to infection - I offered him hospital admission but he refused since no one to take care of his wife. Depomedrol 120 mg IM given + albuterol neb Prednisone 10 mg tabs Take 4 tabs  daily with food x 4 days, then 3 tabs daily x 4 days, then 2 tabs daily x 4 days, then 1 tab daily x4 days then stop. #40 Use albuterol nebs upto 4 times day as needed Trial of symbicort 160 2 puffs twice daily Take lasix 40 mg daily x 7 - Agree that he does not need this long term CXR today Levaquin x 7ds

## 2013-01-20 ENCOUNTER — Telehealth: Payer: Self-pay | Admitting: Pulmonary Disease

## 2013-01-20 NOTE — Telephone Encounter (Signed)
Pt returned call & asked to be reached on home phone, [phone].  [name]

## 2013-01-20 NOTE — Telephone Encounter (Signed)
I spoke with patient about results and he verbalized understanding and had no questions 

## 2013-01-20 NOTE — Telephone Encounter (Signed)
lmomtcb x1 for pt Result Note    No pna

## 2013-01-24 ENCOUNTER — Telehealth: Payer: Self-pay | Admitting: Family Medicine

## 2013-01-24 ENCOUNTER — Ambulatory Visit: Payer: Medicare Other | Admitting: Cardiology

## 2013-02-03 ENCOUNTER — Ambulatory Visit (INDEPENDENT_AMBULATORY_CARE_PROVIDER_SITE_OTHER): Payer: Medicare Other | Admitting: Internal Medicine

## 2013-02-03 ENCOUNTER — Encounter: Payer: Self-pay | Admitting: Internal Medicine

## 2013-02-03 VITALS — BP 142/88 | HR 67 | Ht 73.0 in | Wt 250.4 lb

## 2013-02-03 DIAGNOSIS — J441 Chronic obstructive pulmonary disease with (acute) exacerbation: Secondary | ICD-10-CM

## 2013-02-03 MED ORDER — FUROSEMIDE 20 MG PO TABS: ORAL_TABLET | ORAL | Status: DC

## 2013-02-03 NOTE — Patient Instructions (Addendum)
We want to wait on antibiotics for now  Script sent for lasix to take every other day as needed- diurteic. Follow up with Dr Harding/ Cardiologist about this.
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
## 2013-02-11 NOTE — Assessment & Plan Note (Signed)
From description, he may have an acute bronchitis. He seems to feel that much of his weight gain is from his chronic constipation with abdominal bloating. Would question how much might be fluid retention without peripheral edema. Plan-wait on antibiotic. Add Lasix 20 mg every other day, #7. Make followup appointment with cardiology/Dr. Herbie Baltimore. A shorter call back if no better

## 2013-02-20 ENCOUNTER — Other Ambulatory Visit: Payer: Self-pay

## 2013-02-20 MED ORDER — HYOSCYAMINE SULFATE ER 0.375 MG PO TB12: 0.3750 mg | ORAL_TABLET | ORAL | Status: DC | PRN

## 2013-02-21 ENCOUNTER — Encounter: Payer: Self-pay | Admitting: Cardiology

## 2013-02-21 ENCOUNTER — Ambulatory Visit (INDEPENDENT_AMBULATORY_CARE_PROVIDER_SITE_OTHER): Payer: Medicare Other | Admitting: Cardiology

## 2013-02-21 VITALS — BP 170/90 | HR 84 | Ht 73.0 in | Wt 244.6 lb

## 2013-02-21 DIAGNOSIS — R600 Localized edema: Secondary | ICD-10-CM

## 2013-02-21 DIAGNOSIS — I350 Nonrheumatic aortic (valve) stenosis: Secondary | ICD-10-CM

## 2013-02-21 DIAGNOSIS — I10 Essential (primary) hypertension: Secondary | ICD-10-CM

## 2013-02-21 DIAGNOSIS — I359 Nonrheumatic aortic valve disorder, unspecified: Secondary | ICD-10-CM

## 2013-02-21 DIAGNOSIS — R609 Edema, unspecified: Secondary | ICD-10-CM

## 2013-02-21 DIAGNOSIS — R079 Chest pain, unspecified: Secondary | ICD-10-CM

## 2013-02-21 DIAGNOSIS — E785 Hyperlipidemia, unspecified: Secondary | ICD-10-CM

## 2013-02-21 NOTE — Patient Instructions (Addendum)
Take  FUROSEMIDE  EVERY OTHER DAY UNLESS YOU NEED TO EXTRA TABLET.   WILL RE [facility]  Renal doppler  And LFT.   Your physician wants you to follow-up in 6 MONTH Dr Herbie Baltimore.unless test results  are ABNORMAL. You will receive a reminder letter in the mail two months in advance. If you don't receive a letter, please call our office to schedule the follow-up appointment.

## 2013-02-22 ENCOUNTER — Other Ambulatory Visit: Payer: Self-pay | Admitting: *Deleted

## 2013-02-22 MED ORDER — FUROSEMIDE 20 MG PO TABS: ORAL_TABLET | ORAL | Status: DC

## 2013-02-23 ENCOUNTER — Ambulatory Visit: Payer: Medicare Other | Admitting: Family Medicine

## 2013-02-26 ENCOUNTER — Encounter: Payer: Self-pay | Admitting: Cardiology

## 2013-02-26 DIAGNOSIS — R600 Localized edema: Secondary | ICD-10-CM | POA: Insufficient documentation

## 2013-02-26 NOTE — Assessment & Plan Note (Addendum)
He does have labile blood pressure, keeping his to note is a coronary is a twice a day medication, if taken once a day there are ups and downs. Did not tolerate increasing 25 mg twice a day. He is on a stable dose of Diovan HCTZ. My recheck of his blood pressure today was 150/70 mmHg. To be told, I think we can live with blood pressures in the 1:30 to 150 systolic range. He does not have any evidence of hypertensive heart disease, or significant diastolic dysfunction. He tends to do poorly with orthostasis if we over pushed his medications.  My inclination is to increase his furosemide to leave his blood pressure medications alone. Due to lability of his pressures, I will check Renal Artery Dopplers.

## 2013-02-26 NOTE — Assessment & Plan Note (Signed)
These symptoms could be anything, but most likely are either GI related or musculoskeletal. He did not tolerate Imdur. With a negative cath and my view, the only other potential obese microvascular disease. I am reluctant to put him on Ranexa simply because of his other medications and GI issues.

## 2013-02-26 NOTE — Assessment & Plan Note (Signed)
Mild stenosis, should not cause symptoms. Recently followup with echocardiogram next year.
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
## 2013-02-26 NOTE — Assessment & Plan Note (Signed)
On statin placenta vibrate. This is then followed by private physician. Last results were relatively reassuring with LDL of 75. The triglycerides were elevated at 318 which is why fenofibrate started. He should be due for lab recheck about that time I see him back. It has not been checked we'll do it at that time.

## 2013-02-26 NOTE — Assessment & Plan Note (Signed)
I really think this is multifactorial. His peripheral neuropathy certainly does not help.. I simply do not think this is related to a heart condition as his pulmonary pressure not elevated on echocardiogram. He denies any symptoms of left-sided heart failure, and evaluation of his left ventricle is proven to be been fine.  Plan: I agree with plan to do a standing dose of Lasix every other day and daily for worsening edema. I simply went over daily weight management and adjusting Lasix accordingly.

## 2013-03-13 ENCOUNTER — Encounter (HOSPITAL_COMMUNITY): Payer: Medicare Other

## 2013-03-16 ENCOUNTER — Encounter (HOSPITAL_COMMUNITY): Payer: Self-pay | Admitting: *Deleted

## 2013-03-16 ENCOUNTER — Encounter (HOSPITAL_COMMUNITY): Payer: Medicare Other

## 2013-03-30 ENCOUNTER — Encounter (HOSPITAL_COMMUNITY): Payer: Medicare Other

## 2013-04-18 ENCOUNTER — Other Ambulatory Visit (INDEPENDENT_AMBULATORY_CARE_PROVIDER_SITE_OTHER): Payer: Self-pay

## 2013-04-18 ENCOUNTER — Telehealth (INDEPENDENT_AMBULATORY_CARE_PROVIDER_SITE_OTHER): Payer: Self-pay | Admitting: Surgery

## 2013-04-18 ENCOUNTER — Ambulatory Visit (INDEPENDENT_AMBULATORY_CARE_PROVIDER_SITE_OTHER): Payer: Medicare Other

## 2013-04-18 DIAGNOSIS — Z23 Encounter for immunization: Secondary | ICD-10-CM

## 2013-04-18 DIAGNOSIS — R600 Localized edema: Secondary | ICD-10-CM

## 2013-04-18 DIAGNOSIS — R109 Unspecified abdominal pain: Secondary | ICD-10-CM

## 2013-04-18 NOTE — Telephone Encounter (Signed)
Office appt cx. U/S order place in epic. Order to scheduling coord to set up U/S and call pt.

## 2013-04-18 NOTE — Telephone Encounter (Signed)
Patient was referred to me for evaluation of possible ascites.  I called patient to discuss symptoms.  Will cancel office visit at [facility] for tomorrow.  Will arrange for abdominal ultrasound to evaluate for possible ascites.  Will contact patient with results.  Velora Heckler, MD, [facility] Office: [phone]

## 2013-04-18 NOTE — Telephone Encounter (Signed)
He should be fine from a Cardiac standpoint.  Marykay Lex, MD

## 2013-04-19 ENCOUNTER — Ambulatory Visit: Payer: Medicare Other | Admitting: Gastroenterology

## 2013-04-19 ENCOUNTER — Telehealth (INDEPENDENT_AMBULATORY_CARE_PROVIDER_SITE_OTHER): Payer: Self-pay | Admitting: *Deleted

## 2013-04-19 ENCOUNTER — Encounter (INDEPENDENT_AMBULATORY_CARE_PROVIDER_SITE_OTHER): Payer: Medicare Other | Admitting: Surgery

## 2013-04-19 NOTE — Telephone Encounter (Signed)
I spoke with pt to inform him of his appt for an abdominal ultrasound at [facility] on 11/21 with an arrival time of 11:00am.  I instructed pt to be NPO after midnight.  He is agreeable to this appt; however, the phone number was provided in case he needs to reschedule.  I also called Dr. Elissa Hefty office and spoke with Karel Jarvis the scheduler and she stated that the patient is to call them back when he is ready to schedule the renal artery duplex in their office.  I instructed him to call them to get that scheduled.  He is agreeable to the current plan of care.

## 2013-04-21 ENCOUNTER — Other Ambulatory Visit: Payer: Medicare Other

## 2013-05-02 ENCOUNTER — Other Ambulatory Visit: Payer: Medicare Other

## 2013-05-05 ENCOUNTER — Other Ambulatory Visit: Payer: Self-pay | Admitting: Family Medicine

## 2013-05-08 ENCOUNTER — Other Ambulatory Visit: Payer: Medicare Other

## 2013-05-29 ENCOUNTER — Encounter: Payer: Self-pay | Admitting: Family Medicine

## 2013-05-29 ENCOUNTER — Ambulatory Visit (INDEPENDENT_AMBULATORY_CARE_PROVIDER_SITE_OTHER): Payer: Medicare Other | Admitting: Family Medicine

## 2013-05-29 VITALS — BP 160/80 | HR 68 | Temp 97.5°F | Resp 24 | Wt 246.0 lb

## 2013-05-29 DIAGNOSIS — I10 Essential (primary) hypertension: Secondary | ICD-10-CM

## 2013-05-29 DIAGNOSIS — Z23 Encounter for immunization: Secondary | ICD-10-CM

## 2013-05-29 MED ORDER — DOXAZOSIN MESYLATE 4 MG PO TABS: 4.0000 mg | ORAL_TABLET | Freq: Every day | ORAL | Status: DC

## 2013-05-29 NOTE — Progress Notes (Signed)
Subjective:    Patient ID: [name], male    DOB: 22-Jul-1950, 62 y.o.   MRN: [id number]  HPI Patient is here today to reestablish care.  He has a very complicated past medical history including borderline type 2 diabetes mellitus, peripheral neuropathy, labile hypertension, esophageal stricture, GERD, severe osteoarthritis in both knees, among other things.  He is currently awaiting surgery for knee replacement. However per the patient, though not as long as his blood pressure is not under good control. His cardiologist has recommended an ultrasound of his renal arteries to rule out renal artery stenosis. The patient is currently trying to schedule this. In the meantime his blood pressure has been ranging 150-160/80 his. He is currently on valsartan/HCTZ as well as carvedilol.  He has a significant history of edema making amlodipine and other calcium channel blockers a poor option.  His heart rate is already borderline low making increasing her beta blocker also a poor option.    Past Medical History  Diagnosis Date  . Diabetes mellitus   . Dyslipidemia (high LDL; low HDL)     statin intolerant; on fibrate  . Hypertension     very labile  . GERD (gastroesophageal reflux disease)   . Asthma   . Stroke   . Colon polyps   . Anemia   . Depression   . Osteoarthritis   . Clotting disorder     LLE DVT (also small vessel)  . COPD (chronic obstructive pulmonary disease)   . Neuromuscular disorder     Agent orange; Peripheral Neuroapthy  . Hearing loss   . Fainting   . Weakness   . Small bowel obstruction 2001    s/p multiple bowel surgeries; from war wounds  . Seizure   . Right shoulder injury   . Left knee injury   . Back pain, chronic, followed at pain clinic 11/25/2011  . Family history of acute myocardial infarction. and premature CAD 11/25/2011  . Nephrolithiasis   . Mild aortic stenosis by prior echocardiogram 03/2012    moderate Sclerosis.  . Chest pain with low risk for  cardiac etiology 10/2011    Non-obstructive CAD by Cath; negative Lexiscan in 08/2011  . Edema leg     Wears compression stockings; 08/2012 dopplers - no DVT or thrombophlebitis; mild R Popliteal V reflux - no VNUS ablation candidate  . Dementia     Mild   Current Outpatient Prescriptions on File Prior to Visit  Medication Sig Dispense Refill  . albuterol (PROVENTIL HFA) 108 (90 BASE) MCG/ACT inhaler Inhale 2 puffs into the lungs every 6 (six) hours as needed. For wheezing and/or shortness of breath       . ALPRAZolam (XANAX) 1 MG tablet Take 1 mg by mouth every 6 (six) hours. Take one tablet every 6 to 8 hours and take one tablet at bedtime      . aspirin 81 MG chewable tablet Chew 81 mg by mouth daily.      . beclomethasone (BECONASE-AQ) 42 MCG/SPRAY nasal spray Place 2 sprays into the nose 2 (two) times daily. Dose is for each nostril.      . budesonide-formoterol (SYMBICORT) 160-4.5 MCG/ACT inhaler Inhale 2 puffs into the lungs 2 (two) times daily.  1 Inhaler  0  . carvedilol (COREG) 25 MG tablet TAKE 1 TABLET TWICE DAILY  60 tablet  0  . fenofibrate (TRICOR) 145 MG tablet Take 0.5 tablets (72.5 mg total) by mouth daily.  30 tablet  5  .  furosemide (LASIX) 20 MG tablet Take 1 tablet by mouth every other day  30 tablet  11  . hyoscyamine (LEVBID) 0.375 MG 12 hr tablet Take 1 tablet (0.375 mg total) by mouth as needed. For stomach cramps  60 tablet  3  . montelukast (SINGULAIR) 10 MG tablet Take 1 tablet by mouth at bedtime.      [name] morphine (MSIR) 30 MG tablet Take 30 mg by mouth every 4 (four) hours as needed. FOR PAIN      . Multiple Vitamins-Minerals (CENTRUM SILVER PO) Take 1 capsule by mouth daily.      . Multiple Vitamins-Minerals (OCUVITE PRESERVISION PO) Take 1 capsule by mouth daily.      . nitroGLYCERIN (NITROSTAT) 0.4 MG SL tablet Place 1 tablet (0.4 mg total) under the tongue every 5 (five) minutes as needed for chest pain.  20 tablet  6  . potassium chloride SA (K-DUR,KLOR-CON) 20  MEQ tablet Take 20 mEq by mouth daily as needed (for fluid rentention).       . pravastatin (PRAVACHOL) 40 MG tablet Take 1 tablet by mouth daily.      . promethazine (PHENERGAN) 25 MG tablet Take 25 mg by mouth every 6 (six) hours as needed. FOR NAUSEA      . tiotropium (SPIRIVA) 18 MCG inhalation capsule Place 18 mcg into inhaler and inhale daily.      [name] triamcinolone ointment (KENALOG) 0.5 % Apply topically 2 (two) times daily. For up to 10 days. Avoid the face.  30 g  1  . TURMERIC PO Take by mouth.      . valsartan-hydrochlorothiazide (DIOVAN-HCT) 320-12.5 MG per tablet Take 1 tablet by mouth daily.  30 tablet  6   No current facility-administered medications on file prior to visit.   Allergies  Allergen Reactions  . Ivp Dye [Iodinated Diagnostic Agents]     Omni - Paque Contrast   . Aspirin   . Atorvastatin     REACTION: myalgias  . Butorphanol Tartrate   . Bystolic [Nebivolol Hcl]     Nightmares, flashbacks  . Codeine     rash  . Doxycycline     Rash, SOB  . Iohexol      Desc: hives,neck and torso erythemia   . Latex   . Methocarbamol     rash  . Morphine And Related     Pt is on out patient morphine not allergic to morphine  . Oxycodone Hcl   . Pentazocine Lactate     rash  . Succinylcholine Chloride   . Synvisc [Hylan G-F 20]     rash  . Dilaudid [Hydromorphone Hcl] Nausea Only and Rash    Aggressive    Past Surgical History  Procedure Laterality Date  . Joint replacement      right knee replaced 2x  . Colonoscopy    . Exploratory laparotomy with extensive lysis of adhesions  10/1999  . Small bowel obstruction  1996  . Partial small bowel obstruction  1999  . Lexiscan myoview  08/2011    Negative  . Doppler echocardiography  2012    Essentially normal wirth moderate concentric LVH and moderate aortic sclerosis   . Cardiac catheterization  11/26/2011    Normal  . Total knee arthroplasty     History   Social History  . Marital Status: Married     Spouse Name: N/A    Number of Children: 3  . Years of Education: N/A   Occupational History  .  Disabled    Social History Main Topics  . Smoking status: Never Smoker   . Smokeless tobacco: Never Used  . Alcohol Use: No  . Drug Use: No  . Sexual Activity: Not on file   Other Topics Concern  . Not on file   Social History Narrative   He is a father of 3, grandfather 5. One of his sons was killed during the 4 in [place]. 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 He was exposed to [employer].   Not very physically active bed due to extreme disability/debilitation from osteoarthritis and peripheral neuropathy.      Review of Systems  All other systems reviewed and are negative.       Objective:   Physical Exam  Vitals reviewed. Constitutional: He is oriented to person, place, and time.  Neck: Neck supple. No thyromegaly present.  Cardiovascular: Normal rate, regular rhythm and normal heart sounds.  Exam reveals no gallop and no friction rub.   No murmur heard. Pulmonary/Chest: Effort normal and breath sounds normal. No respiratory distress. He has no wheezes. He has no rales. He exhibits no tenderness.  Abdominal: Soft. Bowel sounds are normal. He exhibits mass. He exhibits no distension. There is no tenderness. There is no rebound and no guarding.  Musculoskeletal: He exhibits edema.  Lymphadenopathy:    He has no cervical adenopathy.  Neurological: He is alert and oriented to person, place, and time. No cranial nerve deficit. He exhibits normal muscle tone. Coordination normal.   patient has a midline ventral abdominal incision with a small ventral hernia        Assessment & Plan:  1. HTN (hypertension) Add doxazosin 4 mg by mouth daily and recheck blood pressure in 2 weeks. Also check a CMP and a fasting lipid panel. - COMPLETE METABOLIC PANEL WITH GFR; Future - Lipid panel; Future  2. Need for prophylactic vaccination against  Streptococcus pneumoniae (pneumococcus) - Pneumococcal conjugate vaccine 13-valent IM

## 2013-06-05 ENCOUNTER — Ambulatory Visit: Payer: Medicare Other | Admitting: Internal Medicine

## 2013-06-05 ENCOUNTER — Other Ambulatory Visit: Payer: Self-pay | Admitting: *Deleted

## 2013-06-05 MED ORDER — CARVEDILOL 25 MG PO TABS: 25.0000 mg | ORAL_TABLET | Freq: Two times a day (BID) | ORAL | Status: DC

## 2013-06-12 ENCOUNTER — Encounter: Payer: Self-pay | Admitting: Family Medicine

## 2013-06-12 ENCOUNTER — Ambulatory Visit (INDEPENDENT_AMBULATORY_CARE_PROVIDER_SITE_OTHER): Payer: Medicare Other | Admitting: Family Medicine

## 2013-06-12 VITALS — BP 146/80 | HR 76 | Temp 98.0°F | Resp 20 | Wt 248.0 lb

## 2013-06-12 DIAGNOSIS — I10 Essential (primary) hypertension: Secondary | ICD-10-CM

## 2013-06-12 DIAGNOSIS — R0989 Other specified symptoms and signs involving the circulatory and respiratory systems: Secondary | ICD-10-CM

## 2013-06-12 MED ORDER — TIZANIDINE HCL 4 MG PO CAPS: 4.0000 mg | ORAL_CAPSULE | Freq: Three times a day (TID) | ORAL | Status: DC | PRN

## 2013-06-12 NOTE — Progress Notes (Signed)
Subjective:    Patient ID: [name], male    DOB: 11-25-1950, 63 y.o.   MRN: [id number]  HPI 05/29/13 Patient is here today to reestablish care.  He has a very complicated past medical history including borderline type 2 diabetes mellitus, peripheral neuropathy, labile hypertension, esophageal stricture, GERD, severe osteoarthritis in both knees, among other things.  He is currently awaiting surgery for knee replacement. However per the patient, though not as long as his blood pressure is not under good control. His cardiologist has recommended an ultrasound of his renal arteries to rule out renal artery stenosis. The patient is currently trying to schedule this. In the meantime his blood pressure has been ranging 150-160/80 his. He is currently on valsartan/HCTZ as well as carvedilol.  He has a significant history of edema making amlodipine and other calcium channel blockers a poor option.  His heart rate is already borderline low making increasing her beta blocker also a poor option.    At that time, my plan was: 1. HTN (hypertension) Add doxazosin 4 mg by mouth daily and recheck blood pressure in 2 weeks. Also check a CMP and a fasting lipid panel. - COMPLETE METABOLIC PANEL WITH GFR; Future - Lipid panel; Future  2. Need for prophylactic vaccination against Streptococcus pneumoniae (pneumococcus) - Pneumococcal conjugate vaccine 13-valent IM  06/12/13 Patient is here today for followup. His blood pressure has improved and is now ranging 140-150/80. He denies any hypotensive episodes. He denies any orthostatic dizziness on the doxazosin. On his examination he has a prominent left carotid bruit. He has a slight right carotid bruit and a 3/6 systolic ejection murmur heard best over the aortic valve consistent with his mild to moderate aortic sclerosis  Past Medical History  Diagnosis Date  . Diabetes mellitus   . Dyslipidemia (high LDL; low HDL)     statin intolerant; on fibrate  .  Hypertension     very labile  . GERD (gastroesophageal reflux disease)   . Asthma   . Stroke   . Colon polyps   . Anemia   . Depression   . Osteoarthritis   . Clotting disorder     LLE DVT (also small vessel)  . COPD (chronic obstructive pulmonary disease)   . Neuromuscular disorder     Agent orange; Peripheral Neuroapthy  . Hearing loss   . Fainting   . Weakness   . Small bowel obstruction 2001    s/p multiple bowel surgeries; from war wounds  . Seizure   . Right shoulder injury   . Left knee injury   . Back pain, chronic, followed at pain clinic 11/25/2011  . Family history of acute myocardial infarction. and premature CAD 11/25/2011  . Nephrolithiasis   . Mild aortic stenosis by prior echocardiogram 03/2012    moderate Sclerosis.  . Chest pain with low risk for cardiac etiology 10/2011    Non-obstructive CAD by Cath; negative Lexiscan in 08/2011  . Edema leg     Wears compression stockings; 08/2012 dopplers - no DVT or thrombophlebitis; mild R Popliteal V reflux - no VNUS ablation candidate  . Dementia     Mild   Current Outpatient Prescriptions on File Prior to Visit  Medication Sig Dispense Refill  . albuterol (PROVENTIL HFA) 108 (90 BASE) MCG/ACT inhaler Inhale 2 puffs into the lungs every 6 (six) hours as needed. For wheezing and/or shortness of breath       . ALPRAZolam (XANAX) 1 MG tablet Take 1 mg  by mouth every 6 (six) hours. Take one tablet every 6 to 8 hours and take one tablet at bedtime      . aspirin 81 MG chewable tablet Chew 81 mg by mouth daily.      . beclomethasone (BECONASE-AQ) 42 MCG/SPRAY nasal spray Place 2 sprays into the nose 2 (two) times daily. Dose is for each nostril.      . budesonide-formoterol (SYMBICORT) 160-4.5 MCG/ACT inhaler Inhale 2 puffs into the lungs 2 (two) times daily.  1 Inhaler  0  . carvedilol (COREG) 25 MG tablet Take 1 tablet (25 mg total) by mouth 2 (two) times daily.  60 tablet  0  . doxazosin (CARDURA) 4 MG tablet Take 1 tablet  (4 mg total) by mouth daily.  30 tablet  3  . fenofibrate (TRICOR) 145 MG tablet Take 0.5 tablets (72.5 mg total) by mouth daily.  30 tablet  5  . furosemide (LASIX) 20 MG tablet Take 1 tablet by mouth every other day  30 tablet  11  . hyoscyamine (LEVBID) 0.375 MG 12 hr tablet Take 1 tablet (0.375 mg total) by mouth as needed. For stomach cramps  60 tablet  3  . montelukast (SINGULAIR) 10 MG tablet Take 1 tablet by mouth at bedtime.      [name] morphine (MSIR) 30 MG tablet Take 30 mg by mouth every 4 (four) hours as needed. FOR PAIN      . Multiple Vitamins-Minerals (CENTRUM SILVER PO) Take 1 capsule by mouth daily.      . Multiple Vitamins-Minerals (OCUVITE PRESERVISION PO) Take 1 capsule by mouth daily.      . nitroGLYCERIN (NITROSTAT) 0.4 MG SL tablet Place 1 tablet (0.4 mg total) under the tongue every 5 (five) minutes as needed for chest pain.  20 tablet  6  . potassium chloride SA (K-DUR,KLOR-CON) 20 MEQ tablet Take 20 mEq by mouth daily as needed (for fluid rentention).       . pravastatin (PRAVACHOL) 40 MG tablet Take 1 tablet by mouth daily.      . promethazine (PHENERGAN) 25 MG tablet Take 25 mg by mouth every 6 (six) hours as needed. FOR NAUSEA      . tiotropium (SPIRIVA) 18 MCG inhalation capsule Place 18 mcg into inhaler and inhale daily.      [name] triamcinolone ointment (KENALOG) 0.5 % Apply topically 2 (two) times daily. For up to 10 days. Avoid the face.  30 g  1  . TURMERIC PO Take by mouth.      . valsartan-hydrochlorothiazide (DIOVAN-HCT) 320-12.5 MG per tablet Take 1 tablet by mouth daily.  30 tablet  6   No current facility-administered medications on file prior to visit.   Allergies  Allergen Reactions  . Ivp Dye [Iodinated Diagnostic Agents]     Omni - Paque Contrast   . Aspirin   . Atorvastatin     REACTION: myalgias  . Butorphanol Tartrate   . Bystolic [Nebivolol Hcl]     Nightmares, flashbacks  . Codeine     rash  . Doxycycline     Rash, SOB  . Iohexol      Desc:  hives,neck and torso erythemia   . Latex   . Methocarbamol     rash  . Morphine And Related     Pt is on out patient morphine not allergic to morphine  . Oxycodone Hcl   . Pentazocine Lactate     rash  . Succinylcholine Chloride   . Synvisc [  Hylan G-F 20]     rash  . Dilaudid [Hydromorphone Hcl] Nausea Only and Rash    Aggressive    Past Surgical History  Procedure Laterality Date  . Joint replacement      right knee replaced 2x  . Colonoscopy    . Exploratory laparotomy with extensive lysis of adhesions  10/1999  . Small bowel obstruction  1996  . Partial small bowel obstruction  1999  . Lexiscan myoview  08/2011    Negative  . Doppler echocardiography  2012    Essentially normal wirth moderate concentric LVH and moderate aortic sclerosis   . Cardiac catheterization  11/26/2011    Normal  . Total knee arthroplasty     History   Social History  . Marital Status: Married    Spouse Name: N/A    Number of Children: 3  . Years of Education: N/A   Occupational History  . Disabled    Social History Main Topics  . Smoking status: Never Smoker   . Smokeless tobacco: Never Used  . Alcohol Use: No  . Drug Use: No  . Sexual Activity: Not on file   Other Topics Concern  . Not on file   Social History Narrative   He is a father of 23, grandfather 39. One of his sons was killed during the 48 in [place]. 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 He was exposed to [employer].   Not very physically active bed due to extreme disability/debilitation from osteoarthritis and peripheral neuropathy.      Review of Systems  All other systems reviewed and are negative.       Objective:   Physical Exam  Vitals reviewed. Constitutional: He is oriented to person, place, and time.  Neck: Neck supple. No thyromegaly present.  Cardiovascular: Normal rate and regular rhythm.  Exam reveals no gallop and no friction rub.   Murmur heard. Pulses:       Carotid pulses are 1+ on the right side, and 2+ on the left side. Pulmonary/Chest: Effort normal and breath sounds normal. No respiratory distress. He has no wheezes. He has no rales. He exhibits no tenderness.  Abdominal: Soft. Bowel sounds are normal. He exhibits mass. He exhibits no distension. There is no tenderness. There is no rebound and no guarding.  Musculoskeletal: He exhibits edema.  Lymphadenopathy:    He has no cervical adenopathy.  Neurological: He is alert and oriented to person, place, and time. No cranial nerve deficit. He exhibits normal muscle tone. Coordination normal.   patient has a midline ventral abdominal incision with a small ventral hernia        Assessment & Plan:  1. HTN (hypertension) Blood pressure is better controlled. Continue doxazosin 4 mg by mouth daily for the time being. In one month his blood pressure is no better I would increase doses as an 8 mg by mouth daily. 2. Left carotid bruit I will obtain carotid artery doppler US. Consult vascular surgery if greater than 70% stenosis is found. - US Carotid Duplex Bilateral; Future

## 2013-06-15 ENCOUNTER — Other Ambulatory Visit (HOSPITAL_COMMUNITY): Payer: Self-pay | Admitting: Cardiology

## 2013-06-15 DIAGNOSIS — R0989 Other specified symptoms and signs involving the circulatory and respiratory systems: Secondary | ICD-10-CM

## 2013-06-19 ENCOUNTER — Encounter (HOSPITAL_COMMUNITY): Payer: Medicare Other

## 2013-06-21 ENCOUNTER — Ambulatory Visit (HOSPITAL_COMMUNITY): Payer: Medicare Other | Attending: Family Medicine

## 2013-06-21 ENCOUNTER — Encounter: Payer: Self-pay | Admitting: Cardiology

## 2013-06-21 DIAGNOSIS — R42 Dizziness and giddiness: Secondary | ICD-10-CM | POA: Insufficient documentation

## 2013-06-21 DIAGNOSIS — I6529 Occlusion and stenosis of unspecified carotid artery: Secondary | ICD-10-CM

## 2013-06-21 DIAGNOSIS — E119 Type 2 diabetes mellitus without complications: Secondary | ICD-10-CM | POA: Insufficient documentation

## 2013-06-21 DIAGNOSIS — J449 Chronic obstructive pulmonary disease, unspecified: Secondary | ICD-10-CM | POA: Insufficient documentation

## 2013-06-21 DIAGNOSIS — E785 Hyperlipidemia, unspecified: Secondary | ICD-10-CM | POA: Insufficient documentation

## 2013-06-21 DIAGNOSIS — I10 Essential (primary) hypertension: Secondary | ICD-10-CM | POA: Insufficient documentation

## 2013-06-21 DIAGNOSIS — I658 Occlusion and stenosis of other precerebral arteries: Secondary | ICD-10-CM | POA: Insufficient documentation

## 2013-06-21 DIAGNOSIS — R0989 Other specified symptoms and signs involving the circulatory and respiratory systems: Secondary | ICD-10-CM

## 2013-06-21 DIAGNOSIS — J4489 Other specified chronic obstructive pulmonary disease: Secondary | ICD-10-CM | POA: Insufficient documentation

## 2013-06-26 ENCOUNTER — Ambulatory Visit (INDEPENDENT_AMBULATORY_CARE_PROVIDER_SITE_OTHER): Payer: Medicare Other | Admitting: Family Medicine

## 2013-06-26 ENCOUNTER — Encounter: Payer: Self-pay | Admitting: Family Medicine

## 2013-06-26 VITALS — BP 150/80 | HR 78 | Temp 98.1°F | Resp 22 | Ht 73.0 in | Wt 251.0 lb

## 2013-06-26 DIAGNOSIS — I10 Essential (primary) hypertension: Secondary | ICD-10-CM

## 2013-06-26 DIAGNOSIS — D485 Neoplasm of uncertain behavior of skin: Secondary | ICD-10-CM

## 2013-06-26 NOTE — Progress Notes (Signed)
Subjective:    Patient ID: [name], male    DOB: [birth year], 63 y.o.   MRN: [id number]  HPI Patient has had a rash on his right hand for approximately one year. It extends from is not close to his proximal wrist. It is a well-circumscribed erythematous rash with serpiginous borders.  Previous KOH tests have been negative. The patient tried a combination of topical antifungal creams as well as topical cortisone creams without benefit. He wears a glove every day on that hand. I have repeatedly asked him not to wear the glove but he is certain that the glove was not causing a rash. He blames the rash on exposure to water from his mobile home.  While he was working on his mobile home his hand was exposed to water that caused the skin to burn.  The rash developed shortly thereafter and has persisted for over 10 months. It also seems to be spreading. His blood pressure is improved but still elevated at 140-150/70-80. Past Medical History  Diagnosis Date  . Diabetes mellitus   . Dyslipidemia (high LDL; low HDL)     statin intolerant; on fibrate  . Hypertension     very labile  . GERD (gastroesophageal reflux disease)   . Asthma   . Stroke   . Colon polyps   . Anemia   . Depression   . Osteoarthritis   . Clotting disorder     LLE DVT (also small vessel)  . COPD (chronic obstructive pulmonary disease)   . Neuromuscular disorder     Agent orange; Peripheral Neuroapthy  . Hearing loss   . Fainting   . Weakness   . Small bowel obstruction 2001    s/p multiple bowel surgeries; from war wounds  . Seizure   . Right shoulder injury   . Left knee injury   . Back pain, chronic, followed at pain clinic 11/25/2011  . Family history of acute myocardial infarction. and premature CAD 11/25/2011  . Nephrolithiasis   . Mild aortic stenosis by prior echocardiogram 03/2012    moderate Sclerosis.  . Chest pain with low risk for cardiac etiology 10/2011    Non-obstructive CAD by Cath; negative  Lexiscan in 08/2011  . Edema leg     Wears compression stockings; 08/2012 dopplers - no DVT or thrombophlebitis; mild R Popliteal V reflux - no VNUS ablation candidate  . Dementia     Mild   Current Outpatient Prescriptions on File Prior to Visit  Medication Sig Dispense Refill  . albuterol (PROVENTIL HFA) 108 (90 BASE) MCG/ACT inhaler Inhale 2 puffs into the lungs every 6 (six) hours as needed. For wheezing and/or shortness of breath       . ALPRAZolam (XANAX) 1 MG tablet Take 1 mg by mouth every 6 (six) hours. Take one tablet every 6 to 8 hours and take one tablet at bedtime      . aspirin 81 MG chewable tablet Chew 81 mg by mouth daily.      . beclomethasone (BECONASE-AQ) 42 MCG/SPRAY nasal spray Place 2 sprays into the nose 2 (two) times daily. Dose is for each nostril.      . budesonide-formoterol (SYMBICORT) 160-4.5 MCG/ACT inhaler Inhale 2 puffs into the lungs 2 (two) times daily.  1 Inhaler  0  . carvedilol (COREG) 25 MG tablet Take 1 tablet (25 mg total) by mouth 2 (two) times daily.  60 tablet  0  . doxazosin (CARDURA) 4 MG tablet Take 1 tablet (4  mg total) by mouth daily.  30 tablet  3  . fenofibrate (TRICOR) 145 MG tablet Take 0.5 tablets (72.5 mg total) by mouth daily.  30 tablet  5  . furosemide (LASIX) 20 MG tablet Take 1 tablet by mouth every other day  30 tablet  11  . hyoscyamine (LEVBID) 0.375 MG 12 hr tablet Take 1 tablet (0.375 mg total) by mouth as needed. For stomach cramps  60 tablet  3  . montelukast (SINGULAIR) 10 MG tablet Take 1 tablet by mouth at bedtime.      [name] morphine (MSIR) 30 MG tablet Take 30 mg by mouth every 4 (four) hours as needed. FOR PAIN      . Multiple Vitamins-Minerals (CENTRUM SILVER PO) Take 1 capsule by mouth daily.      . Multiple Vitamins-Minerals (OCUVITE PRESERVISION PO) Take 1 capsule by mouth daily.      . nitroGLYCERIN (NITROSTAT) 0.4 MG SL tablet Place 1 tablet (0.4 mg total) under the tongue every 5 (five) minutes as needed for chest pain.   20 tablet  6  . potassium chloride SA (K-DUR,KLOR-CON) 20 MEQ tablet Take 20 mEq by mouth daily as needed (for fluid rentention).       . pravastatin (PRAVACHOL) 40 MG tablet Take 1 tablet by mouth daily.      . promethazine (PHENERGAN) 25 MG tablet Take 25 mg by mouth every 6 (six) hours as needed. FOR NAUSEA      . tiotropium (SPIRIVA) 18 MCG inhalation capsule Place 18 mcg into inhaler and inhale daily.      [name] tiZANidine (ZANAFLEX) 4 MG capsule Take 1 capsule (4 mg total) by mouth 3 (three) times daily as needed for muscle spasms.  30 capsule  0  . triamcinolone ointment (KENALOG) 0.5 % Apply topically 2 (two) times daily. For up to 10 days. Avoid the face.  30 g  1  . TURMERIC PO Take by mouth.      . valsartan-hydrochlorothiazide (DIOVAN-HCT) 320-12.5 MG per tablet Take 1 tablet by mouth daily.  30 tablet  6   No current facility-administered medications on file prior to visit.   Allergies  Allergen Reactions  . Ivp Dye [Iodinated Diagnostic Agents]     Omni - Paque Contrast   . Aspirin   . Atorvastatin     REACTION: myalgias  . Butorphanol Tartrate   . Bystolic [Nebivolol Hcl]     Nightmares, flashbacks  . Codeine     rash  . Doxycycline     Rash, SOB  . Iohexol      Desc: hives,neck and torso erythemia   . Latex   . Methocarbamol     rash  . Morphine And Related     Pt is on out patient morphine not allergic to morphine  . Oxycodone Hcl   . Pentazocine Lactate     rash  . Succinylcholine Chloride   . Synvisc [Hylan G-F 20]     rash  . Dilaudid [Hydromorphone Hcl] Nausea Only and Rash    Aggressive    History   Social History  . Marital Status: Married    Spouse Name: N/A    Number of Children: 3  . Years of Education: N/A   Occupational History  . Disabled    Social History Main Topics  . Smoking status: Never Smoker   . Smokeless tobacco: Never Used  . Alcohol Use: No  . Drug Use: No  . Sexual Activity: Not on file  Other Topics Concern  . Not  on file   Social History Narrative   He is a father of 40, grandfather 6. One of his sons was killed during the 36 in [place]. 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 He was exposed to [employer].   Not very physically active bed due to extreme disability/debilitation from osteoarthritis and peripheral neuropathy.      Review of Systems  All other systems reviewed and are negative.       Objective:   Physical Exam  Vitals reviewed. Cardiovascular: Normal rate and regular rhythm.   Pulmonary/Chest: Effort normal and breath sounds normal.  Skin: Rash noted. There is erythema.  erythematous rash on the dorsum of the right hand from the wrist to the fingertips. The proximal portion of the rash shows a serpiginous well-demarcated erythematous border with papules and fine scale.        Assessment & Plan:  1. Neoplasm of uncertain behavior of skin I believe this is either contact dermatitis to the glove versus cutaneous lymphoma versus tinea corporis.  I anesthetized the lesion was 0.1 % lidocaine with epinephrine. I performed a shave biopsy 1.5 cm x 1.5 cm in size. Hemostasis was achieved with copious application of Drysol and pressure dressing. A biopsy was taken from the dorsum of the right wrist.  I continue to recommend that the patient stop wearing gloves on the right hand - Pathology  2. HTN (hypertension) Increase doxazosin to 8 mg by mouth daily.

## 2013-06-27 ENCOUNTER — Ambulatory Visit: Payer: Medicare Other | Admitting: Internal Medicine

## 2013-06-28 LAB — PATHOLOGY

## 2013-06-30 ENCOUNTER — Other Ambulatory Visit: Payer: Self-pay | Admitting: Family Medicine

## 2013-06-30 ENCOUNTER — Telehealth: Payer: Self-pay | Admitting: Family Medicine

## 2013-06-30 DIAGNOSIS — I471 Supraventricular tachycardia: Secondary | ICD-10-CM

## 2013-06-30 DIAGNOSIS — I493 Ventricular premature depolarization: Secondary | ICD-10-CM

## 2013-06-30 DIAGNOSIS — R002 Palpitations: Secondary | ICD-10-CM

## 2013-06-30 MED ORDER — DOXAZOSIN MESYLATE 8 MG PO TABS: 8.0000 mg | ORAL_TABLET | Freq: Every day | ORAL | Status: DC

## 2013-06-30 NOTE — Telephone Encounter (Signed)
Episode last night of high BP 189/110  P-167 and irregular.  States you increased his Doxazosin to 8 mg a day.  (he has been taking the 4 mg BID).  He then rested for awhile.  After resting BP was 142/78  P-60.  This morning BP 142/80 P-60.  Also reports day before yesterday he had some slight chest "twinges"  Which he said was relieved bu S/L NTG.  Wanted to let you know and see if you had any advice.  Doxazosin medication change made to medication list.

## 2013-06-30 NOTE — Telephone Encounter (Signed)
Spoke to patient. To come Monday and have Holter Monitor applied.  If even happens again over the weekend or becomes severe  GO to ED

## 2013-06-30 NOTE — Telephone Encounter (Signed)
Blood pressure sounds good.  I would recommend an event monitor to see what the palpitations were (? Afib, svt, pvc's).

## 2013-07-03 ENCOUNTER — Telehealth: Payer: Self-pay | Admitting: Family Medicine

## 2013-07-03 ENCOUNTER — Ambulatory Visit (INDEPENDENT_AMBULATORY_CARE_PROVIDER_SITE_OTHER): Payer: Medicare Other | Admitting: Family Medicine

## 2013-07-03 DIAGNOSIS — I10 Essential (primary) hypertension: Secondary | ICD-10-CM

## 2013-07-03 DIAGNOSIS — M259 Joint disorder, unspecified: Secondary | ICD-10-CM

## 2013-07-03 LAB — COMPLETE METABOLIC PANEL WITH GFR
ALBUMIN: 4.3 g/dL (ref 3.5–5.2)
ALT: 19 U/L (ref 0–53)
AST: 18 U/L (ref 0–37)
Alkaline Phosphatase: 74 U/L (ref 39–117)
BUN: 14 mg/dL (ref 6–23)
CALCIUM: 9.5 mg/dL (ref 8.4–10.5)
CO2: 23 meq/L (ref 19–32)
CREATININE: 0.73 mg/dL (ref 0.50–1.35)
Chloride: 104 mEq/L (ref 96–112)
GFR, Est African American: >89 mL/min
Glucose, Bld: 114 mg/dL — ABNORMAL HIGH (ref 70–99)
Potassium: 3.8 mEq/L (ref 3.5–5.3)
Sodium: 136 mEq/L (ref 135–145)
Total Bilirubin: 1 mg/dL (ref 0.2–1.2)
Total Protein: 6.9 g/dL (ref 6.0–8.3)

## 2013-07-03 LAB — LIPID PANEL
CHOL/HDL RATIO: 6.5 ratio
Cholesterol: 175 mg/dL (ref 0–200)
HDL: 27 mg/dL — ABNORMAL LOW (ref >39)
Triglycerides: 522 mg/dL — ABNORMAL HIGH (ref <150)

## 2013-07-03 NOTE — Progress Notes (Signed)
Patient ID: [name], male   DOB: April 17, 1951, 63 y.o.   MRN: [id number] Patient came per request of Dr Dennard Schaumann to have 24 hour Holter monitor put on.  Has been having recent episodes of palpitations.  Monitor was placed and pt instructed to return this same time tomorrow to have removed.  Given instructions for care of monitor and diary sheet given to record any episodes he may have while wearing monitor.

## 2013-07-03 NOTE — Telephone Encounter (Signed)
Pt told about negative pathology for lesion removed from rt wrist area.  Dr Dennard Schaumann still would like second opinion from Dermatology.  Referral initiated

## 2013-07-03 NOTE — Telephone Encounter (Signed)
Message copied by Olena Mater on Mon Jul 03, 2013 11:46 AM ------      Message from: Jenna Luo      Created: Mon Jul 03, 2013  7:55 AM       Biopsy shows actinic keratosis which clinically seems unlikely given its size.  I would like him to see a derm for second opinion. ------

## 2013-07-04 ENCOUNTER — Ambulatory Visit: Payer: Medicare Other | Admitting: Family Medicine

## 2013-07-04 DIAGNOSIS — R002 Palpitations: Secondary | ICD-10-CM

## 2013-07-04 NOTE — Progress Notes (Signed)
Patient ID: [name], male   DOB: 07-Jan-1951, 63 y.o.   MRN: [id number] Pt returns for removal of 24 hr Holter monitor ordered by Dr Dennard Schaumann.  Pt reports restless night.  Trouble keeping leads on in place.  Did return diary with some entries noted.  Monitor to [place] pt to expect one week turn around for results

## 2013-07-12 ENCOUNTER — Ambulatory Visit: Payer: Medicare Other | Admitting: Family Medicine

## 2013-07-12 ENCOUNTER — Telehealth: Payer: Self-pay | Admitting: Family Medicine

## 2013-07-12 NOTE — Telephone Encounter (Signed)
Pt states that his left leg is swelling and red from his toes up to his ankles. He is wearing his compression stockings. He has also developed chest congestion with cough, mild fever and chills and was wondering if we could call him in an antibx??

## 2013-07-12 NOTE — Telephone Encounter (Signed)
NTBS.

## 2013-07-12 NOTE — Telephone Encounter (Signed)
Patient aware and appt made 

## 2013-07-17 ENCOUNTER — Ambulatory Visit (INDEPENDENT_AMBULATORY_CARE_PROVIDER_SITE_OTHER): Payer: Medicare Other | Admitting: Family Medicine

## 2013-07-17 ENCOUNTER — Other Ambulatory Visit: Payer: Self-pay | Admitting: Family Medicine

## 2013-07-17 ENCOUNTER — Encounter: Payer: Self-pay | Admitting: Family Medicine

## 2013-07-17 VITALS — BP 130/80 | HR 86 | Temp 97.6°F | Resp 24 | Ht 73.0 in | Wt 244.0 lb

## 2013-07-17 DIAGNOSIS — J441 Chronic obstructive pulmonary disease with (acute) exacerbation: Secondary | ICD-10-CM

## 2013-07-17 MED ORDER — PREDNISONE 20 MG PO TABS: 40.0000 mg | ORAL_TABLET | Freq: Every day | ORAL | Status: DC

## 2013-07-17 MED ORDER — LEVOFLOXACIN 500 MG PO TABS: 500.0000 mg | ORAL_TABLET | Freq: Every day | ORAL | Status: DC
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
## 2013-07-19 ENCOUNTER — Other Ambulatory Visit: Payer: Self-pay | Admitting: Family Medicine

## 2013-07-20 ENCOUNTER — Telehealth: Payer: Self-pay | Admitting: Family Medicine

## 2013-07-20 NOTE — Telephone Encounter (Signed)
Pt wanted you to know that due to a mix up with the pharmacy that he did not get his prednisone or antibx. It was sent to mail order the 1st time and I resent it to [facility]. Pt is there today saying that they still do not have it. i offered to resend it but the pt states that he is seeing Dr. Annamaria Boots tomorrow and he will probably change it anyway so he just wanted you to know that those medications were not taken.

## 2013-07-21 ENCOUNTER — Ambulatory Visit (INDEPENDENT_AMBULATORY_CARE_PROVIDER_SITE_OTHER): Payer: Medicare Other | Admitting: Internal Medicine

## 2013-07-21 ENCOUNTER — Encounter: Payer: Self-pay | Admitting: Internal Medicine

## 2013-07-21 VITALS — BP 130/72 | HR 73 | Ht 73.0 in | Wt 248.0 lb

## 2013-07-21 DIAGNOSIS — J441 Chronic obstructive pulmonary disease with (acute) exacerbation: Secondary | ICD-10-CM
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
## 2013-07-28 ENCOUNTER — Other Ambulatory Visit: Payer: Self-pay | Admitting: Family Medicine

## 2013-07-28 NOTE — Telephone Encounter (Signed)
Refill appropriate and filled per protocol. 

## 2013-08-07 ENCOUNTER — Encounter: Payer: Self-pay | Admitting: Family Medicine

## 2013-08-13 NOTE — Patient Instructions (Signed)
Take the levaquin and prednisone already prescribed  Please call as needed

## 2013-08-13 NOTE — Assessment & Plan Note (Signed)
Acute upper respiratory infection with tracheobronchitis Plan-okay to take the Levaquin and prednisone already prescribed. Patient will pick them up from  Drugstore

## 2013-08-14 ENCOUNTER — Encounter: Payer: Self-pay | Admitting: Family Medicine

## 2013-08-14 ENCOUNTER — Ambulatory Visit (INDEPENDENT_AMBULATORY_CARE_PROVIDER_SITE_OTHER): Payer: Medicare Other | Admitting: Family Medicine

## 2013-08-14 ENCOUNTER — Other Ambulatory Visit: Payer: Self-pay | Admitting: Cardiology

## 2013-08-14 VITALS — BP 160/90 | HR 72 | Temp 98.3°F | Resp 18 | Ht 73.0 in | Wt 243.0 lb

## 2013-08-14 DIAGNOSIS — M25579 Pain in unspecified ankle and joints of unspecified foot: Secondary | ICD-10-CM

## 2013-08-14 DIAGNOSIS — S300XXA Contusion of lower back and pelvis, initial encounter: Secondary | ICD-10-CM

## 2013-08-14 DIAGNOSIS — M25569 Pain in unspecified knee: Secondary | ICD-10-CM

## 2013-08-14 DIAGNOSIS — M25562 Pain in left knee: Secondary | ICD-10-CM

## 2013-08-14 DIAGNOSIS — M25572 Pain in left ankle and joints of left foot: Secondary | ICD-10-CM

## 2013-08-14 DIAGNOSIS — S20229A Contusion of unspecified back wall of thorax, initial encounter: Secondary | ICD-10-CM

## 2013-08-14 MED ORDER — CYCLOBENZAPRINE HCL 10 MG PO TABS: 10.0000 mg | ORAL_TABLET | Freq: Three times a day (TID) | ORAL | Status: DC | PRN

## 2013-08-14 MED ORDER — LUBIPROSTONE 24 MCG PO CAPS: 24.0000 ug | ORAL_CAPSULE | Freq: Two times a day (BID) | ORAL | Status: DC

## 2013-08-14 NOTE — Telephone Encounter (Signed)
Rx was sent to pharmacy electronically. 
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
## 2013-08-16 ENCOUNTER — Telehealth: Payer: Self-pay | Admitting: *Deleted

## 2013-08-16 NOTE — Telephone Encounter (Signed)
Received e-mail requesting PA for Amitzia.   PA submitted.

## 2013-08-22 ENCOUNTER — Ambulatory Visit
Admission: RE | Admit: 2013-08-22 | Discharge: 2013-08-22 | Disposition: A | Discharge status: Home / Self Care | Payer: Medicare Other | Source: Ambulatory Visit | Attending: Family Medicine | Admitting: Family Medicine

## 2013-08-22 DIAGNOSIS — M25572 Pain in left ankle and joints of left foot: Secondary | ICD-10-CM

## 2013-08-22 DIAGNOSIS — M25562 Pain in left knee: Secondary | ICD-10-CM

## 2013-08-22 DIAGNOSIS — S300XXA Contusion of lower back and pelvis, initial encounter: Secondary | ICD-10-CM

## 2013-08-22 NOTE — Telephone Encounter (Signed)
Approved through 08/16/2014 - faxed to pharm

## 2013-08-23 ENCOUNTER — Other Ambulatory Visit: Payer: Self-pay | Admitting: Cardiology

## 2013-08-23 NOTE — Telephone Encounter (Signed)
Rx was sent to pharmacy electronically. 

## 2013-08-24 ENCOUNTER — Telehealth: Payer: Self-pay | Admitting: *Deleted

## 2013-08-24 NOTE — Telephone Encounter (Signed)
Call placed to patient and patient made aware.  

## 2013-08-24 NOTE — Telephone Encounter (Signed)
There is arthritis in ankle which may account for pain but no fracture.

## 2013-08-24 NOTE — Telephone Encounter (Signed)
Received return call from patient. Inquired about why ankle is swollen if no injury present. States that he continues to have muscle spasms down leg to ankle.   MD please advise.

## 2013-09-12 ENCOUNTER — Ambulatory Visit (INDEPENDENT_AMBULATORY_CARE_PROVIDER_SITE_OTHER): Payer: Medicare Other | Admitting: Family Medicine

## 2013-09-12 ENCOUNTER — Encounter: Payer: Self-pay | Admitting: Family Medicine

## 2013-09-12 ENCOUNTER — Telehealth: Payer: Self-pay | Admitting: *Deleted

## 2013-09-12 VITALS — BP 150/80 | HR 80 | Temp 97.7°F | Resp 18 | Ht 73.0 in | Wt 238.0 lb

## 2013-09-12 DIAGNOSIS — I10 Essential (primary) hypertension: Secondary | ICD-10-CM

## 2013-09-12 MED ORDER — ALPRAZOLAM 1 MG PO TABS: 1.0000 mg | ORAL_TABLET | Freq: Every evening | ORAL | Status: DC | PRN

## 2013-09-12 MED ORDER — LINACLOTIDE 145 MCG PO CAPS: 145.0000 ug | ORAL_CAPSULE | Freq: Every day | ORAL | Status: DC

## 2013-09-12 MED ORDER — FUROSEMIDE 20 MG PO TABS: ORAL_TABLET | ORAL | Status: DC

## 2013-09-12 MED ORDER — MONTELUKAST SODIUM 10 MG PO TABS: 10.0000 mg | ORAL_TABLET | Freq: Every day | ORAL | Status: DC

## 2013-09-12 MED ORDER — CLONIDINE HCL 0.1 MG PO TABS: 0.1000 mg | ORAL_TABLET | Freq: Three times a day (TID) | ORAL | Status: DC

## 2013-09-12 MED ORDER — NYSTATIN 100000 UNIT/ML MT SUSP: 5.0000 mL | Freq: Four times a day (QID) | OROMUCOSAL | Status: DC

## 2013-09-12 MED ORDER — TIOTROPIUM BROMIDE MONOHYDRATE 18 MCG IN CAPS: 18.0000 ug | ORAL_CAPSULE | Freq: Every day | RESPIRATORY_TRACT | Status: DC

## 2013-09-12 MED ORDER — NITROGLYCERIN 0.4 MG SL SUBL: 0.4000 mg | SUBLINGUAL_TABLET | SUBLINGUAL | Status: DC | PRN

## 2013-09-12 MED ORDER — CARVEDILOL 25 MG PO TABS: 12.5000 mg | ORAL_TABLET | Freq: Every day | ORAL | Status: DC
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
## 2013-09-12 NOTE — Telephone Encounter (Signed)
Received e-mail requesting PA for linzess.  PA submitted.

## 2013-09-14 ENCOUNTER — Encounter: Payer: Self-pay | Admitting: *Deleted

## 2013-09-20 NOTE — Telephone Encounter (Signed)
PA has been approved until 09/13/14 - faxed to pharm

## 2013-09-20 NOTE — Telephone Encounter (Signed)
Pharmacy - [facility]

## 2013-10-06 ENCOUNTER — Ambulatory Visit: Payer: Medicare Other | Admitting: Family Medicine

## 2013-10-09 ENCOUNTER — Ambulatory Visit (INDEPENDENT_AMBULATORY_CARE_PROVIDER_SITE_OTHER): Payer: Medicare Other | Admitting: Family Medicine

## 2013-10-09 ENCOUNTER — Encounter: Payer: Self-pay | Admitting: Family Medicine

## 2013-10-09 VITALS — BP 140/96 | HR 94 | Temp 98.4°F | Resp 22 | Ht 73.0 in | Wt 225.0 lb

## 2013-10-09 DIAGNOSIS — R112 Nausea with vomiting, unspecified: Secondary | ICD-10-CM
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
## 2013-10-10 LAB — CBC WITH DIFFERENTIAL/PLATELET
BASOS PCT: 1 % (ref 0–1)
Basophils Absolute: 0.1 10*3/uL (ref 0.0–0.1)
EOS ABS: 0.3 10*3/uL (ref 0.0–0.7)
EOS PCT: 3 % (ref 0–5)
HCT: 39.8 % (ref 39.0–52.0)
Hemoglobin: 13.6 g/dL (ref 13.0–17.0)
LYMPHS ABS: 2.1 10*3/uL (ref 0.7–4.0)
Lymphocytes Relative: 24 % (ref 12–46)
MCH: 28.5 pg (ref 26.0–34.0)
MCHC: 34.2 g/dL (ref 30.0–36.0)
MCV: 83.3 fL (ref 78.0–100.0)
Monocytes Absolute: 0.7 10*3/uL (ref 0.1–1.0)
Monocytes Relative: 8 % (ref 3–12)
NEUTROS PCT: 64 % (ref 43–77)
Neutro Abs: 5.5 10*3/uL (ref 1.7–7.7)
PLATELETS: 394 10*3/uL (ref 150–400)
RBC: 4.78 MIL/uL (ref 4.22–5.81)
RDW: 13.8 % (ref 11.5–15.5)
WBC: 8.6 10*3/uL (ref 4.0–10.5)

## 2013-10-10 LAB — COMPLETE METABOLIC PANEL WITH GFR
ALK PHOS: 79 U/L (ref 39–117)
ALT: 12 U/L (ref 0–53)
AST: 13 U/L (ref 0–37)
Albumin: 4.1 g/dL (ref 3.5–5.2)
BUN: 10 mg/dL (ref 6–23)
CALCIUM: 9.7 mg/dL (ref 8.4–10.5)
CO2: 24 mEq/L (ref 19–32)
CREATININE: 0.58 mg/dL (ref 0.50–1.35)
Chloride: 102 mEq/L (ref 96–112)
GFR, Est African American: >89 mL/min
GFR, Est Non African American: >89 mL/min
GLUCOSE: 100 mg/dL — AB (ref 70–99)
Potassium: 4.2 mEq/L (ref 3.5–5.3)
Sodium: 137 mEq/L (ref 135–145)
TOTAL PROTEIN: 7 g/dL (ref 6.0–8.3)
Total Bilirubin: 0.9 mg/dL (ref 0.2–1.2)

## 2013-11-08 MED ORDER — CARVEDILOL 25 MG PO TABS: 25.0000 mg | ORAL_TABLET | Freq: Every day | ORAL | Status: DC

## 2013-11-11 ENCOUNTER — Other Ambulatory Visit: Payer: Self-pay | Admitting: Cardiology

## 2013-11-13 NOTE — Telephone Encounter (Signed)
Rx was sent to pharmacy electronically. 

## 2013-12-07 ENCOUNTER — Other Ambulatory Visit: Payer: Self-pay | Admitting: *Deleted

## 2013-12-07 MED ORDER — CARVEDILOL 25 MG PO TABS: 25.0000 mg | ORAL_TABLET | Freq: Every day | ORAL | Status: DC

## 2013-12-09 ENCOUNTER — Other Ambulatory Visit: Payer: Self-pay | Admitting: *Deleted

## 2013-12-09 DIAGNOSIS — E785 Hyperlipidemia, unspecified: Secondary | ICD-10-CM

## 2013-12-09 DIAGNOSIS — E119 Type 2 diabetes mellitus without complications: Secondary | ICD-10-CM

## 2013-12-09 DIAGNOSIS — I10 Essential (primary) hypertension: Secondary | ICD-10-CM

## 2014-01-15 ENCOUNTER — Encounter (HOSPITAL_COMMUNITY): Payer: Self-pay | Admitting: Emergency Medicine

## 2014-01-15 ENCOUNTER — Emergency Department (HOSPITAL_COMMUNITY): Payer: Medicare Other

## 2014-01-15 ENCOUNTER — Inpatient Hospital Stay (HOSPITAL_COMMUNITY)
Admission: EM | Admit: 2014-01-15 | Discharge: 2014-01-22 | DRG: 390 | Disposition: A | Discharge status: Home / Self Care | Payer: Medicare Other | Attending: Internal Medicine | Admitting: Internal Medicine

## 2014-01-15 DIAGNOSIS — Z807 Family history of other malignant neoplasms of lymphoid, hematopoietic and related tissues: Secondary | ICD-10-CM

## 2014-01-15 DIAGNOSIS — Z888 Allergy status to other drugs, medicaments and biological substances status: Secondary | ICD-10-CM | POA: Diagnosis not present

## 2014-01-15 DIAGNOSIS — K565 Intestinal adhesions [bands], unspecified as to partial versus complete obstruction: Secondary | ICD-10-CM | POA: Diagnosis present

## 2014-01-15 DIAGNOSIS — Z881 Allergy status to other antibiotic agents status: Secondary | ICD-10-CM | POA: Diagnosis not present

## 2014-01-15 DIAGNOSIS — Z8249 Family history of ischemic heart disease and other diseases of the circulatory system: Secondary | ICD-10-CM

## 2014-01-15 DIAGNOSIS — E118 Type 2 diabetes mellitus with unspecified complications: Secondary | ICD-10-CM | POA: Diagnosis present

## 2014-01-15 DIAGNOSIS — I5032 Chronic diastolic (congestive) heart failure: Secondary | ICD-10-CM

## 2014-01-15 DIAGNOSIS — F4310 Post-traumatic stress disorder, unspecified: Secondary | ICD-10-CM | POA: Diagnosis present

## 2014-01-15 DIAGNOSIS — H9190 Unspecified hearing loss, unspecified ear: Secondary | ICD-10-CM | POA: Diagnosis present

## 2014-01-15 DIAGNOSIS — K56609 Unspecified intestinal obstruction, unspecified as to partial versus complete obstruction: Secondary | ICD-10-CM | POA: Diagnosis present

## 2014-01-15 DIAGNOSIS — J449 Chronic obstructive pulmonary disease, unspecified: Secondary | ICD-10-CM | POA: Diagnosis present

## 2014-01-15 DIAGNOSIS — Z885 Allergy status to narcotic agent status: Secondary | ICD-10-CM | POA: Diagnosis not present

## 2014-01-15 DIAGNOSIS — Z87442 Personal history of urinary calculi: Secondary | ICD-10-CM | POA: Diagnosis not present

## 2014-01-15 DIAGNOSIS — G9009 Other idiopathic peripheral autonomic neuropathy: Secondary | ICD-10-CM | POA: Diagnosis present

## 2014-01-15 DIAGNOSIS — I10 Essential (primary) hypertension: Secondary | ICD-10-CM | POA: Diagnosis present

## 2014-01-15 DIAGNOSIS — E119 Type 2 diabetes mellitus without complications: Secondary | ICD-10-CM | POA: Diagnosis present

## 2014-01-15 DIAGNOSIS — Z823 Family history of stroke: Secondary | ICD-10-CM

## 2014-01-15 DIAGNOSIS — Z79899 Other long term (current) drug therapy: Secondary | ICD-10-CM

## 2014-01-15 DIAGNOSIS — M1710 Unilateral primary osteoarthritis, unspecified knee: Secondary | ICD-10-CM | POA: Diagnosis present

## 2014-01-15 DIAGNOSIS — Z8673 Personal history of transient ischemic attack (TIA), and cerebral infarction without residual deficits: Secondary | ICD-10-CM | POA: Diagnosis not present

## 2014-01-15 DIAGNOSIS — I2510 Atherosclerotic heart disease of native coronary artery without angina pectoris: Secondary | ICD-10-CM | POA: Diagnosis present

## 2014-01-15 DIAGNOSIS — Z903 Acquired absence of stomach [part of]: Secondary | ICD-10-CM

## 2014-01-15 DIAGNOSIS — G894 Chronic pain syndrome: Secondary | ICD-10-CM | POA: Diagnosis present

## 2014-01-15 DIAGNOSIS — R109 Unspecified abdominal pain: Secondary | ICD-10-CM | POA: Diagnosis present

## 2014-01-15 DIAGNOSIS — Z9889 Other specified postprocedural states: Secondary | ICD-10-CM

## 2014-01-15 DIAGNOSIS — Z91041 Radiographic dye allergy status: Secondary | ICD-10-CM

## 2014-01-15 DIAGNOSIS — M549 Dorsalgia, unspecified: Secondary | ICD-10-CM | POA: Diagnosis present

## 2014-01-15 DIAGNOSIS — Z7982 Long term (current) use of aspirin: Secondary | ICD-10-CM | POA: Diagnosis not present

## 2014-01-15 DIAGNOSIS — Z860100 Personal history of colon polyps, unspecified: Secondary | ICD-10-CM

## 2014-01-15 DIAGNOSIS — E785 Hyperlipidemia, unspecified: Secondary | ICD-10-CM | POA: Diagnosis present

## 2014-01-15 DIAGNOSIS — Z96659 Presence of unspecified artificial knee joint: Secondary | ICD-10-CM | POA: Diagnosis not present

## 2014-01-15 DIAGNOSIS — T4275XA Adverse effect of unspecified antiepileptic and sedative-hypnotic drugs, initial encounter: Secondary | ICD-10-CM | POA: Diagnosis present

## 2014-01-15 DIAGNOSIS — J4489 Other specified chronic obstructive pulmonary disease: Secondary | ICD-10-CM | POA: Diagnosis present

## 2014-01-15 DIAGNOSIS — I350 Nonrheumatic aortic (valve) stenosis: Secondary | ICD-10-CM | POA: Diagnosis present

## 2014-01-15 DIAGNOSIS — G8929 Other chronic pain: Secondary | ICD-10-CM | POA: Diagnosis present

## 2014-01-15 DIAGNOSIS — E876 Hypokalemia: Secondary | ICD-10-CM | POA: Diagnosis present

## 2014-01-15 DIAGNOSIS — I498 Other specified cardiac arrhythmias: Secondary | ICD-10-CM | POA: Diagnosis present

## 2014-01-15 DIAGNOSIS — K219 Gastro-esophageal reflux disease without esophagitis: Secondary | ICD-10-CM | POA: Diagnosis present

## 2014-01-15 DIAGNOSIS — Z8679 Personal history of other diseases of the circulatory system: Secondary | ICD-10-CM

## 2014-01-15 DIAGNOSIS — K5909 Other constipation: Secondary | ICD-10-CM | POA: Diagnosis present

## 2014-01-15 DIAGNOSIS — Z86718 Personal history of other venous thrombosis and embolism: Secondary | ICD-10-CM | POA: Diagnosis not present

## 2014-01-15 DIAGNOSIS — F39 Unspecified mood [affective] disorder: Secondary | ICD-10-CM | POA: Diagnosis present

## 2014-01-15 DIAGNOSIS — I110 Hypertensive heart disease with heart failure: Secondary | ICD-10-CM | POA: Diagnosis present

## 2014-01-15 DIAGNOSIS — I359 Nonrheumatic aortic valve disorder, unspecified: Secondary | ICD-10-CM | POA: Diagnosis present

## 2014-01-15 DIAGNOSIS — I739 Peripheral vascular disease, unspecified: Secondary | ICD-10-CM

## 2014-01-15 DIAGNOSIS — R1032 Left lower quadrant pain: Secondary | ICD-10-CM

## 2014-01-15 LAB — COMPREHENSIVE METABOLIC PANEL
ALT: 28 U/L (ref 0–53)
AST: 23 U/L (ref 0–37)
Albumin: 3.8 g/dL (ref 3.5–5.2)
Alkaline Phosphatase: 87 U/L (ref 39–117)
Anion gap: 16 — ABNORMAL HIGH (ref 5–15)
BUN: 13 mg/dL (ref 6–23)
CO2: 24 mEq/L (ref 19–32)
Calcium: 9.5 mg/dL (ref 8.4–10.5)
Chloride: 94 mEq/L — ABNORMAL LOW (ref 96–112)
Creatinine, Ser: 0.73 mg/dL (ref 0.50–1.35)
GFR calc Af Amer: >90 mL/min (ref >90)
GFR calc non Af Amer: >90 mL/min (ref >90)
Glucose, Bld: 97 mg/dL (ref 70–99)
Potassium: 3.5 mEq/L — ABNORMAL LOW (ref 3.7–5.3)
Sodium: 134 mEq/L — ABNORMAL LOW (ref 137–147)
Total Bilirubin: 1.8 mg/dL — ABNORMAL HIGH (ref 0.3–1.2)
Total Protein: 7.8 g/dL (ref 6.0–8.3)

## 2014-01-15 LAB — CBC WITH DIFFERENTIAL/PLATELET
Basophils Absolute: 0 10*3/uL (ref 0.0–0.1)
Basophils Relative: 0 % (ref 0–1)
Eosinophils Absolute: 0.2 10*3/uL (ref 0.0–0.7)
Eosinophils Relative: 2 % (ref 0–5)
HCT: 41.9 % (ref 39.0–52.0)
Hemoglobin: 14.5 g/dL (ref 13.0–17.0)
Lymphocytes Relative: 25 % (ref 12–46)
Lymphs Abs: 2.4 10*3/uL (ref 0.7–4.0)
MCH: 28.5 pg (ref 26.0–34.0)
MCHC: 34.6 g/dL (ref 30.0–36.0)
MCV: 82.3 fL (ref 78.0–100.0)
Monocytes Absolute: 0.8 10*3/uL (ref 0.1–1.0)
Monocytes Relative: 9 % (ref 3–12)
Neutro Abs: 6 10*3/uL (ref 1.7–7.7)
Neutrophils Relative %: 64 % (ref 43–77)
Platelets: 336 10*3/uL (ref 150–400)
RBC: 5.09 MIL/uL (ref 4.22–5.81)
RDW: 12.8 % (ref 11.5–15.5)
WBC: 9.4 10*3/uL (ref 4.0–10.5)

## 2014-01-15 LAB — LIPASE, BLOOD: Lipase: 11 U/L (ref 11–59)

## 2014-01-15 MED ORDER — LIDOCAINE HCL 2 % EX GEL
CUTANEOUS | Status: AC
  Filled 2014-01-15: qty 10

## 2014-01-15 MED ORDER — ONDANSETRON HCL 4 MG/2ML IJ SOLN
4.0000 mg | Freq: Once | INTRAMUSCULAR | Status: AC
  Administered 2014-01-15: 4 mg via INTRAVENOUS
  Filled 2014-01-15: qty 2

## 2014-01-15 MED ORDER — HYDROMORPHONE HCL PF 1 MG/ML IJ SOLN
1.0000 mg | Freq: Once | INTRAMUSCULAR | Status: AC
  Administered 2014-01-15: 1 mg via INTRAVENOUS
  Filled 2014-01-15: qty 1

## 2014-01-15 NOTE — ED Notes (Signed)
Pt sent by Dr Vevelyn Francois for possible small bowel obstruction. Pt been having abd pain, nausea, vomiting, and diarrhea sine Thursday.  Pt has multiple intestine surgeries.
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
## 2014-01-16 DIAGNOSIS — R1032 Left lower quadrant pain: Secondary | ICD-10-CM

## 2014-01-16 DIAGNOSIS — R112 Nausea with vomiting, unspecified: Secondary | ICD-10-CM

## 2014-01-16 DIAGNOSIS — Z9889 Other specified postprocedural states: Secondary | ICD-10-CM

## 2014-01-16 LAB — URINALYSIS, ROUTINE W REFLEX MICROSCOPIC
Glucose, UA: NEGATIVE mg/dL
Hgb urine dipstick: NEGATIVE
Ketones, ur: 15 mg/dL — AB
Nitrite: NEGATIVE
Protein, ur: NEGATIVE mg/dL
Specific Gravity, Urine: 1.031 — ABNORMAL HIGH (ref 1.005–1.030)
Urobilinogen, UA: 1 mg/dL (ref 0.0–1.0)
pH: 5.5 (ref 5.0–8.0)

## 2014-01-16 LAB — URINE MICROSCOPIC-ADD ON

## 2014-01-16 LAB — PROTIME-INR
INR: 1.01 (ref 0.00–1.49)
PROTHROMBIN TIME: 13.3 s (ref 11.6–15.2)

## 2014-01-16 LAB — COMPREHENSIVE METABOLIC PANEL
ALT: 24 U/L (ref 0–53)
AST: 19 U/L (ref 0–37)
Albumin: 3.4 g/dL — ABNORMAL LOW (ref 3.5–5.2)
Alkaline Phosphatase: 81 U/L (ref 39–117)
Anion gap: 13 (ref 5–15)
BILIRUBIN TOTAL: 1.5 mg/dL — AB (ref 0.3–1.2)
BUN: 14 mg/dL (ref 6–23)
CALCIUM: 9.1 mg/dL (ref 8.4–10.5)
CO2: 27 meq/L (ref 19–32)
Chloride: 99 mEq/L (ref 96–112)
Creatinine, Ser: 0.88 mg/dL (ref 0.50–1.35)
GFR calc Af Amer: >90 mL/min (ref >90)
GFR, EST NON AFRICAN AMERICAN: 90 mL/min — AB (ref >90)
GLUCOSE: 112 mg/dL — AB (ref 70–99)
Potassium: 3.8 mEq/L (ref 3.7–5.3)
SODIUM: 139 meq/L (ref 137–147)
Total Protein: 7.1 g/dL (ref 6.0–8.3)

## 2014-01-16 LAB — CBC
HCT: 39.9 % (ref 39.0–52.0)
Hemoglobin: 13.7 g/dL (ref 13.0–17.0)
MCH: 28.5 pg (ref 26.0–34.0)
MCHC: 34.3 g/dL (ref 30.0–36.0)
MCV: 83.1 fL (ref 78.0–100.0)
PLATELETS: 312 10*3/uL (ref 150–400)
RBC: 4.8 MIL/uL (ref 4.22–5.81)
RDW: 12.9 % (ref 11.5–15.5)
WBC: 7.3 10*3/uL (ref 4.0–10.5)

## 2014-01-16 MED ORDER — LINACLOTIDE 145 MCG PO CAPS
145.0000 ug | ORAL_CAPSULE | Freq: Every day | ORAL | Status: DC
  Filled 2014-01-16: qty 1

## 2014-01-16 MED ORDER — DIPHENHYDRAMINE HCL 50 MG/ML IJ SOLN
25.0000 mg | Freq: Once | INTRAMUSCULAR | Status: AC
  Administered 2014-01-16: 25 mg via INTRAVENOUS
  Filled 2014-01-16: qty 1

## 2014-01-16 MED ORDER — ALBUTEROL SULFATE (2.5 MG/3ML) 0.083% IN NEBU: 2.5000 mg | INHALATION_SOLUTION | Freq: Four times a day (QID) | RESPIRATORY_TRACT | Status: DC | PRN

## 2014-01-16 MED ORDER — CARVEDILOL 25 MG PO TABS
25.0000 mg | ORAL_TABLET | Freq: Every day | ORAL | Status: DC
  Filled 2014-01-16 (×2): qty 1

## 2014-01-16 MED ORDER — ASPIRIN 81 MG PO CHEW
81.0000 mg | CHEWABLE_TABLET | Freq: Every day | ORAL | Status: DC
  Filled 2014-01-16: qty 1

## 2014-01-16 MED ORDER — ONDANSETRON 8 MG/NS 50 ML IVPB
8.0000 mg | Freq: Four times a day (QID) | INTRAVENOUS | Status: DC | PRN
  Administered 2014-01-16 – 2014-01-19 (×9): 8 mg via INTRAVENOUS
  Filled 2014-01-16 (×10): qty 8

## 2014-01-16 MED ORDER — METOCLOPRAMIDE HCL 5 MG/ML IJ SOLN
5.0000 mg | Freq: Four times a day (QID) | INTRAMUSCULAR | Status: DC
  Administered 2014-01-16 – 2014-01-21 (×19): 5 mg via INTRAVENOUS
  Filled 2014-01-16 (×7): qty 1
  Filled 2014-01-16: qty 2
  Filled 2014-01-16 (×10): qty 1
  Filled 2014-01-16: qty 2
  Filled 2014-01-16 (×3): qty 1
  Filled 2014-01-16: qty 2

## 2014-01-16 MED ORDER — HYDRALAZINE HCL 10 MG PO TABS
10.0000 mg | ORAL_TABLET | Freq: Four times a day (QID) | ORAL | Status: DC | PRN
  Filled 2014-01-16: qty 1

## 2014-01-16 MED ORDER — ONDANSETRON HCL 4 MG PO TABS
4.0000 mg | ORAL_TABLET | Freq: Four times a day (QID) | ORAL | Status: DC | PRN
  Administered 2014-01-20 – 2014-01-21 (×2): 4 mg via ORAL
  Filled 2014-01-16 (×3): qty 1

## 2014-01-16 MED ORDER — CLONIDINE HCL 0.1 MG PO TABS
0.1000 mg | ORAL_TABLET | Freq: Three times a day (TID) | ORAL | Status: DC
  Filled 2014-01-16 (×3): qty 1

## 2014-01-16 MED ORDER — ENOXAPARIN SODIUM 40 MG/0.4ML ~~LOC~~ SOLN
40.0000 mg | SUBCUTANEOUS | Status: DC
Start: 2014-01-16 — End: 2014-01-22
  Administered 2014-01-16 – 2014-01-22 (×7): 40 mg via SUBCUTANEOUS
  Filled 2014-01-16 (×7): qty 0.4

## 2014-01-16 MED ORDER — DILTIAZEM HCL 25 MG/5ML IV SOLN
10.0000 mg | Freq: Four times a day (QID) | INTRAVENOUS | Status: DC | PRN
  Filled 2014-01-16: qty 5

## 2014-01-16 MED ORDER — HYOSCYAMINE SULFATE ER 0.375 MG PO TB12
0.3750 mg | ORAL_TABLET | ORAL | Status: DC | PRN
  Filled 2014-01-16: qty 1

## 2014-01-16 MED ORDER — HYDRALAZINE HCL 20 MG/ML IJ SOLN
10.0000 mg | Freq: Four times a day (QID) | INTRAMUSCULAR | Status: DC | PRN
  Administered 2014-01-16: 10 mg via INTRAVENOUS
  Filled 2014-01-16: qty 1

## 2014-01-16 MED ORDER — MORPHINE SULFATE 30 MG PO TABS
30.0000 mg | ORAL_TABLET | ORAL | Status: DC | PRN
  Administered 2014-01-16 (×2): 30 mg via ORAL
  Filled 2014-01-16 (×2): qty 1

## 2014-01-16 MED ORDER — TIOTROPIUM BROMIDE MONOHYDRATE 18 MCG IN CAPS
18.0000 ug | ORAL_CAPSULE | Freq: Every day | RESPIRATORY_TRACT | Status: DC
  Administered 2014-01-16 – 2014-01-22 (×7): 18 ug via RESPIRATORY_TRACT
  Filled 2014-01-16 (×2): qty 5

## 2014-01-16 MED ORDER — LORAZEPAM 2 MG/ML IJ SOLN
0.5000 mg | Freq: Four times a day (QID) | INTRAMUSCULAR | Status: DC | PRN
  Administered 2014-01-16 – 2014-01-22 (×11): 0.5 mg via INTRAVENOUS
  Filled 2014-01-16 (×11): qty 1

## 2014-01-16 MED ORDER — POTASSIUM CHLORIDE IN NACL 20-0.9 MEQ/L-% IV SOLN
INTRAVENOUS | Status: DC
  Administered 2014-01-16 – 2014-01-21 (×7): via INTRAVENOUS
  Filled 2014-01-16 (×10): qty 1000

## 2014-01-16 MED ORDER — MORPHINE SULFATE 2 MG/ML IJ SOLN
2.0000 mg | INTRAMUSCULAR | Status: DC | PRN
  Administered 2014-01-16 (×3): 4 mg via INTRAVENOUS
  Administered 2014-01-16: 2 mg via INTRAVENOUS
  Administered 2014-01-16 – 2014-01-17 (×2): 4 mg via INTRAVENOUS
  Administered 2014-01-17: 2 mg via INTRAVENOUS
  Administered 2014-01-17 (×2): 4 mg via INTRAVENOUS
  Administered 2014-01-18 – 2014-01-19 (×7): 2 mg via INTRAVENOUS
  Administered 2014-01-19: 4 mg via INTRAVENOUS
  Filled 2014-01-16: qty 2
  Filled 2014-01-16: qty 1
  Filled 2014-01-16: qty 2
  Filled 2014-01-16: qty 1
  Filled 2014-01-16 (×2): qty 2
  Filled 2014-01-16 (×6): qty 1
  Filled 2014-01-16 (×2): qty 2
  Filled 2014-01-16: qty 1
  Filled 2014-01-16 (×3): qty 2

## 2014-01-16 MED ORDER — SODIUM CHLORIDE 0.9 % IJ SOLN
3.0000 mL | Freq: Two times a day (BID) | INTRAMUSCULAR | Status: DC
  Administered 2014-01-16 – 2014-01-22 (×5): 3 mL via INTRAVENOUS

## 2014-01-16 MED ORDER — ALPRAZOLAM 1 MG PO TABS
1.0000 mg | ORAL_TABLET | Freq: Three times a day (TID) | ORAL | Status: DC | PRN
Start: 2014-01-15 — End: 2014-01-16
  Administered 2014-01-16: 1 mg via ORAL
  Filled 2014-01-16: qty 1

## 2014-01-16 NOTE — Care Management Note (Addendum)
    Page 1 of 1   01/22/2014     10:20:11 AM CARE MANAGEMENT NOTE 01/22/2014  Patient:  [name], [name]   Account Number:  [id number]  Date Initiated:  01/16/2014  Documentation initiated by:  Sunday Spillers  Subjective/Objective Assessment:   63 yo male admitted with bowel obstruction. PTA lived at home with spouse.     Action/Plan:   Home when stable   Anticipated DC Date:  01/22/2014   Anticipated DC Plan:  [place]  CM consult      Choice offered to / List presented to:             Status of service:  Completed, signed off Medicare Important Message given?  YES (If response is "NO", the following Medicare IM given date fields will be blank) Date Medicare IM given:  01/22/2014 Medicare IM given by:  [facility] Date Additional Medicare IM given:   Additional Medicare IM given by:    Discharge Disposition:  HOME/SELF CARE  Per UR Regulation:  Reviewed for med. necessity/level of care/duration of stay  If discussed at [place] of Stay Meetings, dates discussed:    Comments:
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
## 2014-01-16 NOTE — Progress Notes (Signed)
Patient seen and examined.  Agree with PA's note. Will repeat x-rays in AM.
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
## 2014-01-17 ENCOUNTER — Inpatient Hospital Stay (HOSPITAL_COMMUNITY): Payer: Medicare Other

## 2014-01-17 MED ORDER — CLONIDINE HCL 0.2 MG/24HR TD PTWK
0.2000 mg | MEDICATED_PATCH | TRANSDERMAL | Status: DC
  Administered 2014-01-17: 0.2 mg via TRANSDERMAL
  Filled 2014-01-17: qty 1

## 2014-01-17 MED ORDER — METOPROLOL TARTRATE 1 MG/ML IV SOLN
5.0000 mg | Freq: Four times a day (QID) | INTRAVENOUS | Status: DC
  Administered 2014-01-17 – 2014-01-21 (×18): 5 mg via INTRAVENOUS
  Filled 2014-01-17 (×24): qty 5

## 2014-01-17 MED ORDER — BISACODYL 10 MG RE SUPP
10.0000 mg | Freq: Once | RECTAL | Status: AC
  Administered 2014-01-17: 10 mg via RECTAL
  Filled 2014-01-17: qty 1

## 2014-01-17 MED ORDER — KETOROLAC TROMETHAMINE 15 MG/ML IJ SOLN
15.0000 mg | Freq: Four times a day (QID) | INTRAMUSCULAR | Status: AC | PRN
Start: 2014-01-17 — End: 2014-01-20
  Administered 2014-01-17 – 2014-01-20 (×6): 15 mg via INTRAVENOUS
  Filled 2014-01-17 (×7): qty 1

## 2014-01-17 MED ORDER — ACETAMINOPHEN 10 MG/ML IV SOLN
1000.0000 mg | Freq: Four times a day (QID) | INTRAVENOUS | Status: AC
  Administered 2014-01-17 – 2014-01-18 (×4): 1000 mg via INTRAVENOUS
  Filled 2014-01-17 (×4): qty 100
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
## 2014-01-17 NOTE — Progress Notes (Signed)
Patient ID: [name], male   DOB: May 16, 1951, 63 y.o.   MRN: [id number]    Subjective: Pt still having some pain, not well controlled right now.  Still with emesis when NGT fell out overnight.  Some flatus.  No BM  Objective: Vital signs in last 24 hours: Temp:  [97.5 F (36.4 C)-98.1 F (36.7 C)] 97.6 F (36.4 C) (08/19 0526) Pulse Rate:  [66-87] 69 (08/19 0526) Resp:  [18-20] 20 (08/19 0526) BP: (108-202)/(63-88) 202/79 mmHg (08/19 0526) SpO2:  [97 %-100 %] 98 % (08/19 0851) Weight:  [215 lb 9.8 oz (97.8 kg)] 215 lb 9.8 oz (97.8 kg) (08/19 0526) Last BM Date: 01/13/14  Intake/Output from previous day: 08/18 0701 - 08/19 0700 In: 2481.7 [I.V.:2281.7; IV Piggyback:200] Out: 2850 [Urine:1550; Emesis/NG output:1300] Intake/Output this shift:    PE: Abd: soft, still mildly tender, +BS, NGT with thick bilious output  Lab Results:   Recent Labs  01/15/14 1945 01/16/14 0531  WBC 9.4 7.3  HGB 14.5 13.7  HCT 41.9 39.9  PLT 336 312   BMET  Recent Labs  01/15/14 1945 01/16/14 0531  NA 134* 139  K 3.5* 3.8  CL 94* 99  CO2 24 27  GLUCOSE 97 112*  BUN 13 14  CREATININE 0.73 0.88  CALCIUM 9.5 9.1   PT/INR  Recent Labs  01/16/14 0531  LABPROT 13.3  INR 1.01   CMP     Component Value Date/Time   NA 139 01/16/2014 0531   K 3.8 01/16/2014 0531   CL 99 01/16/2014 0531   CO2 27 01/16/2014 0531   GLUCOSE 112* 01/16/2014 0531   BUN 14 01/16/2014 0531   CREATININE 0.88 01/16/2014 0531   CREATININE 0.58 10/09/2013 1310   CALCIUM 9.1 01/16/2014 0531   PROT 7.1 01/16/2014 0531   ALBUMIN 3.4* 01/16/2014 0531   AST 19 01/16/2014 0531   ALT 24 01/16/2014 0531   ALKPHOS 81 01/16/2014 0531   BILITOT 1.5* 01/16/2014 0531   GFRNONAA 90* 01/16/2014 0531   GFRNONAA >89 10/09/2013 1310   GFRAA >90 01/16/2014 0531   GFRAA >89 10/09/2013 1310   Lipase     Component Value Date/Time   LIPASE 11 01/15/2014 1945       Studies/Results: Ct Abdomen Pelvis Wo Contrast  01/15/2014    CLINICAL DATA:  Small bowel obstruction.  EXAM: CT ABDOMEN AND PELVIS WITHOUT CONTRAST  TECHNIQUE: Multidetector CT imaging of the abdomen and pelvis was performed following the standard protocol without IV contrast.  COMPARISON:  11/09/2012  FINDINGS: The lung bases are clear of acute process. No pleural effusion. Small pulmonary nodules are noted. These are stable when compared to a prior CT from 2006. Some of these are calcified. The heart is normal in size. No pericardial effusion. The distal esophagus is grossly normal. Surgical changes are noted near the GE junction.  CT abdomen:  The liver demonstrates small calcified granulomas but no focal lesions or biliary dilatation. Gallbladder is surgically absent. No common bile duct dilatation. The pancreas is grossly normal. The spleen is normal in size. No focal lesions. The adrenal glands and kidneys are unremarkable. No inflammatory changes or hydronephrosis. No renal or obstructing ureteral calculi.  The stomach is mildly distended. The duodenum is also distended. There is a proximal small bowel obstruction involving the proximal jejunum with a transition in the left abdomen at the level of the iliac crest. No mass is seen. This is likely due to adhesions. The mid distal small bowel  loops are unremarkable. The colon demonstrates moderate stool throughout and probable benign pneumatosis coli. Mild mesenteric edema but no ascites. There are scattered mesenteric and retroperitoneal lymph nodes but no mass or adenopathy. The aorta demonstrates moderate atherosclerotic calcifications.  CT pelvis: The bladder, prostate gland and seminal vesicles are unremarkable. No pelvic mass or adenopathy. No free pelvic fluid collections. No inguinal mass or adenopathy.  Bony structures:  Unremarkable.  IMPRESSION: Proximal small bowel obstruction due to adhesions in the left abdomen at the level of the iliac crest.   Electronically Signed   By: Kalman Jewels M.D.   On:  01/15/2014 21:53   Dg Abd 1 View  01/17/2014   CLINICAL DATA:  Small bowel obstruction.  EXAM: ABDOMEN - 1 VIEW  COMPARISON:  CT scan of January 15, 2014.  FINDINGS: Nasogastric tube tip is seen in proximal stomach. Stool is noted throughout the right and transverse colon. Status post cholecystectomy. No abnormal calcifications are noted. No significant small bowel dilatation is noted. There is noted dilated loops of sigmoid colon which may represent ileus.  IMPRESSION: Dilated loops of sigmoid colon is noted suggesting ileus. No definite small bowel dilatation is noted. Stool is noted throughout the right and transverse colon.   Electronically Signed   By: Sabino Dick M.D.   On: 01/17/2014 09:17    Anti-infectives: Anti-infectives   None       Assessment/Plan  1. SBO 2. Complex PSH  Plan: 1. Will try to control pain with tylenol and toradol as opposed to increasing morphine. 2. Cont NGT 3. X-ray looks better, but clinically patient hasn't improved well enough to consider clamping or removing NGT 4. Give suppository today 5. Repeat films in AM 6. mobilize   LOS: 2 days    Cyndal Kasson E 01/17/2014, 9:45 AM Pager: [phone]

## 2014-01-17 NOTE — Progress Notes (Signed)
Patient seen and examined. He has somewhat of a hostile abdomen. He's also used narcotics in the past for chronic pain. We'll try to limit the amount of narcotic use. Continue nonoperative management.

## 2014-01-18 ENCOUNTER — Inpatient Hospital Stay (HOSPITAL_COMMUNITY): Payer: Medicare Other

## 2014-01-18 LAB — CBC
HEMATOCRIT: 39.7 % (ref 39.0–52.0)
Hemoglobin: 13.8 g/dL (ref 13.0–17.0)
MCH: 28.3 pg (ref 26.0–34.0)
MCHC: 34.8 g/dL (ref 30.0–36.0)
MCV: 81.4 fL (ref 78.0–100.0)
Platelets: 340 10*3/uL (ref 150–400)
RBC: 4.88 MIL/uL (ref 4.22–5.81)
RDW: 12.7 % (ref 11.5–15.5)
WBC: 7.7 10*3/uL (ref 4.0–10.5)

## 2014-01-18 LAB — COMPREHENSIVE METABOLIC PANEL
ALT: 20 U/L (ref 0–53)
ANION GAP: 17 — AB (ref 5–15)
AST: 23 U/L (ref 0–37)
Albumin: 3.3 g/dL — ABNORMAL LOW (ref 3.5–5.2)
Alkaline Phosphatase: 75 U/L (ref 39–117)
BUN: 11 mg/dL (ref 6–23)
CO2: 20 mEq/L (ref 19–32)
Calcium: 9.3 mg/dL (ref 8.4–10.5)
Chloride: 101 mEq/L (ref 96–112)
Creatinine, Ser: 0.66 mg/dL (ref 0.50–1.35)
GFR calc Af Amer: >90 mL/min (ref >90)
GFR calc non Af Amer: >90 mL/min (ref >90)
Glucose, Bld: 97 mg/dL (ref 70–99)
POTASSIUM: 3.9 meq/L (ref 3.7–5.3)
SODIUM: 138 meq/L (ref 137–147)
TOTAL PROTEIN: 6.9 g/dL (ref 6.0–8.3)
Total Bilirubin: 1.1 mg/dL (ref 0.3–1.2)

## 2014-01-18 MED ORDER — CHLORHEXIDINE GLUCONATE 0.12 % MT SOLN
15.0000 mL | Freq: Two times a day (BID) | OROMUCOSAL | Status: DC
  Administered 2014-01-18 – 2014-01-22 (×8): 15 mL via OROMUCOSAL
  Filled 2014-01-18 (×11): qty 15

## 2014-01-18 MED ORDER — CLONIDINE HCL 0.3 MG/24HR TD PTWK
0.3000 mg | MEDICATED_PATCH | TRANSDERMAL | Status: DC
  Administered 2014-01-18: 0.3 mg via TRANSDERMAL
  Filled 2014-01-18: qty 1

## 2014-01-18 MED ORDER — BISACODYL 10 MG RE SUPP
10.0000 mg | Freq: Every day | RECTAL | Status: DC
  Administered 2014-01-19 – 2014-01-22 (×4): 10 mg via RECTAL
  Filled 2014-01-18 (×5): qty 1

## 2014-01-18 NOTE — Progress Notes (Signed)
Subjective: Pass small amount of gas.  Pain improving.  Objective: Vital signs in last 24 hours: Temp:  [97.7 F (36.5 C)-98.7 F (37.1 C)] 97.9 F (36.6 C) (08/20 0514) Pulse Rate:  [64-77] 67 (08/20 0514) Resp:  [20] 20 (08/20 0514) BP: (171-194)/(79-91) 180/79 mmHg (08/20 0514) SpO2:  [98 %-100 %] 99 % (08/20 0846) Weight:  [218 lb 11.1 oz (99.2 kg)] 218 lb 11.1 oz (99.2 kg) (08/20 0514) Last BM Date: 01/13/14  Intake/Output from previous day: 08/19 0701 - 08/20 0700 In: 2324.6 [I.V.:1974.6; IV Piggyback:350] Out: 2150 [Urine:500; Emesis/NG output:1650-bilious Intake/Output this shift:    PE: General- In NAD Abdomen-soft, non-tender, non-distended, few bowel sounds  Lab Results:   Recent Labs  01/16/14 0531 01/18/14 0520  WBC 7.3 7.7  HGB 13.7 13.8  HCT 39.9 39.7  PLT 312 340   BMET  Recent Labs  01/16/14 0531 01/18/14 0520  NA 139 138  K 3.8 3.9  CL 99 101  CO2 27 20  GLUCOSE 112* 97  BUN 14 11  CREATININE 0.88 0.66  CALCIUM 9.1 9.3   PT/INR  Recent Labs  01/16/14 0531  LABPROT 13.3  INR 1.01   Comprehensive Metabolic Panel:    Component Value Date/Time   NA 138 01/18/2014 0520   NA 139 01/16/2014 0531   K 3.9 01/18/2014 0520   K 3.8 01/16/2014 0531   CL 101 01/18/2014 0520   CL 99 01/16/2014 0531   CO2 20 01/18/2014 0520   CO2 27 01/16/2014 0531   BUN 11 01/18/2014 0520   BUN 14 01/16/2014 0531   CREATININE 0.66 01/18/2014 0520   CREATININE 0.88 01/16/2014 0531   CREATININE 0.58 10/09/2013 1310   CREATININE 0.73 07/03/2013 1154   GLUCOSE 97 01/18/2014 0520   GLUCOSE 112* 01/16/2014 0531   CALCIUM 9.3 01/18/2014 0520   CALCIUM 9.1 01/16/2014 0531   AST 23 01/18/2014 0520   AST 19 01/16/2014 0531   ALT 20 01/18/2014 0520   ALT 24 01/16/2014 0531   ALKPHOS 75 01/18/2014 0520   ALKPHOS 81 01/16/2014 0531   BILITOT 1.1 01/18/2014 0520   BILITOT 1.5* 01/16/2014 0531   PROT 6.9 01/18/2014 0520   PROT 7.1 01/16/2014 0531   ALBUMIN 3.3* 01/18/2014 0520   ALBUMIN 3.4* 01/16/2014 0531     Studies/Results: Dg Abd 1 View  01/17/2014   CLINICAL DATA:  Small bowel obstruction.  EXAM: ABDOMEN - 1 VIEW  COMPARISON:  CT scan of January 15, 2014.  FINDINGS: Nasogastric tube tip is seen in proximal stomach. Stool is noted throughout the right and transverse colon. Status post cholecystectomy. No abnormal calcifications are noted. No significant small bowel dilatation is noted. There is noted dilated loops of sigmoid colon which may represent ileus.  IMPRESSION: Dilated loops of sigmoid colon is noted suggesting ileus. No definite small bowel dilatation is noted. Stool is noted throughout the right and transverse colon.   Electronically Signed   By: Sabino Dick M.D.   On: 01/17/2014 09:17   Dg Abd 2 Views  01/18/2014   CLINICAL DATA:  Lower abdominal pain.  Less distention.  EXAM: ABDOMEN - 2 VIEW  COMPARISON:  01/17/2014  FINDINGS: No free intraperitoneal air. Nasogastric tube overlies the stomach. Surgical clips are identified in the upper central abdomen. There is moderate stool within the ascending colon. No evidence for small bowel obstruction. Visualized osseous structures have a normal appearance.  IMPRESSION: No evidence for bowel obstruction. Moderate residual stool in the ascending colon.  Electronically Signed   By: Shon Hale M.D.   On: 01/18/2014 08:32    Anti-infectives: Anti-infectives   None      Assessment Principal Problem:   SBO (small bowel obstruction)-showings signs of slow improvement.  Still with bilious ng output Active Problems:   DIABETES MELLITUS, TYPE II   PERIPHERAL NEUROPATHY: ? Agent Orange related, also has DM-2   HYPERTENSION - very labile   GERD   CEREBROVASCULAR ACCIDENT, HX OF   Mild aortic stenosis by prior echocardiogram   H/O major abdominal surgery    LOS: 3 days   Plan: Continue ng tube to suction.  Continue non-operative management.   Tommye Lehenbauer J 01/18/2014
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
## 2014-01-19 DIAGNOSIS — E119 Type 2 diabetes mellitus without complications: Secondary | ICD-10-CM

## 2014-01-19 LAB — CBC
HEMATOCRIT: 38.1 % — AB (ref 39.0–52.0)
HEMOGLOBIN: 13.6 g/dL (ref 13.0–17.0)
MCH: 28.8 pg (ref 26.0–34.0)
MCHC: 35.7 g/dL (ref 30.0–36.0)
MCV: 80.7 fL (ref 78.0–100.0)
Platelets: 361 10*3/uL (ref 150–400)
RBC: 4.72 MIL/uL (ref 4.22–5.81)
RDW: 12.7 % (ref 11.5–15.5)
WBC: 8.4 10*3/uL (ref 4.0–10.5)

## 2014-01-19 LAB — BASIC METABOLIC PANEL
Anion gap: 17 — ABNORMAL HIGH (ref 5–15)
BUN: 14 mg/dL (ref 6–23)
CHLORIDE: 103 meq/L (ref 96–112)
CO2: 20 mEq/L (ref 19–32)
CREATININE: 0.69 mg/dL (ref 0.50–1.35)
Calcium: 9.5 mg/dL (ref 8.4–10.5)
GFR calc Af Amer: >90 mL/min (ref >90)
GLUCOSE: 89 mg/dL (ref 70–99)
POTASSIUM: 3.9 meq/L (ref 3.7–5.3)
Sodium: 140 mEq/L (ref 137–147)

## 2014-01-19 MED ORDER — MORPHINE SULFATE 4 MG/ML IJ SOLN
4.0000 mg | INTRAMUSCULAR | Status: DC | PRN
  Administered 2014-01-19 – 2014-01-22 (×14): 4 mg via INTRAVENOUS
  Filled 2014-01-19 (×14): qty 1
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
## 2014-01-19 NOTE — Progress Notes (Signed)
Patient ID: SAHITH NURSE, male   DOB: 05-Nov-1950, 63 y.o.   MRN: [id number]    Subjective: Pt still with some LLQ abdominal pain.  Some flatus and 2 BMs yesterday after suppository.  Objective: Vital signs in last 24 hours: Temp:  [97.7 F (36.5 C)-98.9 F (37.2 C)] 98.9 F (37.2 C) (08/21 0548) Pulse Rate:  [59-65] 60 (08/21 0548) Resp:  [20] 20 (08/21 0548) BP: (177-195)/(87-88) 184/87 mmHg (08/21 0548) SpO2:  [99 %-100 %] 100 % (08/21 0548) Weight:  [215 lb 13.3 oz (97.9 kg)] 215 lb 13.3 oz (97.9 kg) (08/21 0548) Last BM Date: 01/18/14  Intake/Output from previous day: 08/20 0701 - 08/21 0700 In: 1459.4 [I.V.:1405.4; IV Piggyback:54] Out: 1850 [Urine:750; Emesis/NG output:1100] Intake/Output this shift:    PE: Abd: soft, still tender in LLQ, +BS, NGT with bilious output, 1750cc/24hr.   Heart: regular, + murmur Lungs: CTAB  Lab Results:   Recent Labs  01/18/14 0520 01/19/14 0022  WBC 7.7 8.4  HGB 13.8 13.6  HCT 39.7 38.1*  PLT 340 361   BMET  Recent Labs  01/18/14 0520 01/19/14 0022  NA 138 140  K 3.9 3.9  CL 101 103  CO2 20 20  GLUCOSE 97 89  BUN 11 14  CREATININE 0.66 0.69  CALCIUM 9.3 9.5   PT/INR No results found for this basename: LABPROT, INR,  in the last 72 hours CMP     Component Value Date/Time   NA 140 01/19/2014 0022   K 3.9 01/19/2014 0022   CL 103 01/19/2014 0022   CO2 20 01/19/2014 0022   GLUCOSE 89 01/19/2014 0022   BUN 14 01/19/2014 0022   CREATININE 0.69 01/19/2014 0022   CREATININE 0.58 10/09/2013 1310   CALCIUM 9.5 01/19/2014 0022   PROT 6.9 01/18/2014 0520   ALBUMIN 3.3* 01/18/2014 0520   AST 23 01/18/2014 0520   ALT 20 01/18/2014 0520   ALKPHOS 75 01/18/2014 0520   BILITOT 1.1 01/18/2014 0520   GFRNONAA >90 01/19/2014 0022   GFRNONAA >89 10/09/2013 1310   GFRAA >90 01/19/2014 0022   GFRAA >89 10/09/2013 1310   Lipase     Component Value Date/Time   LIPASE 11 01/15/2014 1945       Studies/Results: Dg Abd 1  View  01-Feb-2014   CLINICAL DATA:  Small bowel obstruction.  EXAM: ABDOMEN - 1 VIEW  COMPARISON:  CT scan of January 15, 2014.  FINDINGS: Nasogastric tube tip is seen in proximal stomach. Stool is noted throughout the right and transverse colon. Status post cholecystectomy. No abnormal calcifications are noted. No significant small bowel dilatation is noted. There is noted dilated loops of sigmoid colon which may represent ileus.  IMPRESSION: Dilated loops of sigmoid colon is noted suggesting ileus. No definite small bowel dilatation is noted. Stool is noted throughout the right and transverse colon.   Electronically Signed   By: Sabino Dick M.D.   On: 02-01-14 09:17   Dg Abd 2 Views  01/18/2014   CLINICAL DATA:  Lower abdominal pain.  Less distention.  EXAM: ABDOMEN - 2 VIEW  COMPARISON:  02/01/14  FINDINGS: No free intraperitoneal air. Nasogastric tube overlies the stomach. Surgical clips are identified in the upper central abdomen. There is moderate stool within the ascending colon. No evidence for small bowel obstruction. Visualized osseous structures have a normal appearance.  IMPRESSION: No evidence for bowel obstruction. Moderate residual stool in the ascending colon.   Electronically Signed   By: Shon Hale  M.D.   On: 01/18/2014 08:32    Anti-infectives: Anti-infectives   None       Assessment/Plan  1. PSBO 2. Complex surgical history  Plan: 1. Continue conservative management for now.  Given his complex surgical history, we would like to avoid an operation and get him better conservatively if able.  Cont NGT for now. 2. Repeat films in the am   LOS: 4 days    Cesiah Westley E 01/19/2014, 8:18 AM Pager: [phone]

## 2014-01-19 NOTE — Progress Notes (Signed)
Patient seen and examined.  Still with high ng output but having some BMs.  He has a hostile abdomen by report.  Will repeat x-rays tomorrow.

## 2014-01-20 ENCOUNTER — Inpatient Hospital Stay (HOSPITAL_COMMUNITY): Payer: Medicare Other

## 2014-01-20 MED ORDER — ASPIRIN 81 MG PO CHEW
81.0000 mg | CHEWABLE_TABLET | Freq: Every day | ORAL | Status: DC
  Administered 2014-01-20 – 2014-01-22 (×3): 81 mg via ORAL
  Filled 2014-01-20 (×3): qty 1

## 2014-01-20 MED ORDER — HYDROCHLOROTHIAZIDE 12.5 MG PO CAPS
12.5000 mg | ORAL_CAPSULE | Freq: Every day | ORAL | Status: DC
  Administered 2014-01-20 – 2014-01-22 (×3): 12.5 mg via ORAL
  Filled 2014-01-20 (×3): qty 1

## 2014-01-20 MED ORDER — CARVEDILOL 25 MG PO TABS
25.0000 mg | ORAL_TABLET | Freq: Every day | ORAL | Status: DC
  Administered 2014-01-21 – 2014-01-22 (×2): 25 mg via ORAL
  Filled 2014-01-20 (×3): qty 1

## 2014-01-20 MED ORDER — IRBESARTAN 300 MG PO TABS
300.0000 mg | ORAL_TABLET | Freq: Every day | ORAL | Status: DC
  Administered 2014-01-20 – 2014-01-22 (×3): 300 mg via ORAL
  Filled 2014-01-20 (×3): qty 1

## 2014-01-20 MED ORDER — VALSARTAN-HYDROCHLOROTHIAZIDE 320-12.5 MG PO TABS: 1.0000 | ORAL_TABLET | Freq: Every day | ORAL | Status: DC

## 2014-01-20 NOTE — Progress Notes (Signed)
NG tube D/C'd per MD order. Pt tolerated well. No nausea or abd pain voiced.
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
## 2014-01-20 NOTE — Progress Notes (Signed)
General Surgery Note  LOS: 5 days  POD -     Assessment/Plan: 1.  SBO  Complex surgical history  KUB -01/18/2014 - no evidence of obstuction  Symptomatically better.  Will remove NGT.  Give only sips from floor.  Restart diet tomorrow if he does well.  2.  DM, type 2 3.  Chronic pain  On 30 to 60 mg morphine per day - this probably contributes to his bowel issues 3.  HTN -  May restart po meds 4.  DVT prophylaxis - Lovenox   Principal Problem:   SBO (small bowel obstruction) Active Problems:   DIABETES MELLITUS, TYPE II   PERIPHERAL NEUROPATHY: ? Agent Orange related, also has DM-2   HYPERTENSION - very labile   GERD   CEREBROVASCULAR ACCIDENT, HX OF   Mild aortic stenosis by prior echocardiogram   H/O major abdominal surgery  Subjective:  Had at least 3 BM's.  Feels better. He discussed some of his prior surgical history. Objective:   Filed Vitals:   01/20/14 0639  BP: 169/84  Pulse: 62  Temp:   Resp:      Intake/Output from previous day:  08/21 0701 - 08/22 0700 In: 1429.2 [P.O.:240; I.V.:1189.2] Out: 540 [Emesis/NG output:540]  Intake/Output this shift:      Physical Exam:   General: WN older WM who is alert and oriented.    HEENT: Normal. Pupils equal. .   Lungs: Clear.   Abdomen: Soft.  Multiple scars, but no obvious hernia.  No tenderness   Lab Results:    Recent Labs  01/18/14 0520 01/19/14 0022  WBC 7.7 8.4  HGB 13.8 13.6  HCT 39.7 38.1*  PLT 340 361    BMET   Recent Labs  01/18/14 0520 01/19/14 0022  NA 138 140  K 3.9 3.9  CL 101 103  CO2 20 20  GLUCOSE 97 89  BUN 11 14  CREATININE 0.66 0.69  CALCIUM 9.3 9.5    PT/INR  No results found for this basename: LABPROT, INR,  in the last 72 hours  ABG  No results found for this basename: PHART, PCO2, PO2, HCO3,  in the last 72 hours   Studies/Results:  Dg Abd 2 Views  01/18/2014   CLINICAL DATA:  Lower abdominal pain.  Less distention.  EXAM: ABDOMEN - 2 VIEW  COMPARISON:   01/17/2014  FINDINGS: No free intraperitoneal air. Nasogastric tube overlies the stomach. Surgical clips are identified in the upper central abdomen. There is moderate stool within the ascending colon. No evidence for small bowel obstruction. Visualized osseous structures have a normal appearance.  IMPRESSION: No evidence for bowel obstruction. Moderate residual stool in the ascending colon.   Electronically Signed   By: Shon Hale M.D.   On: 01/18/2014 08:32     Anti-infectives:   Anti-infectives   None      Alphonsa Overall, MD, FACS Pager: [phone] Surgery Office: [phone] 01/20/2014

## 2014-01-21 LAB — BASIC METABOLIC PANEL
Anion gap: 14 (ref 5–15)
BUN: 11 mg/dL (ref 6–23)
CALCIUM: 9.5 mg/dL (ref 8.4–10.5)
CO2: 26 mEq/L (ref 19–32)
CREATININE: 0.76 mg/dL (ref 0.50–1.35)
Chloride: 97 mEq/L (ref 96–112)
GFR calc Af Amer: >90 mL/min (ref >90)
GFR calc non Af Amer: >90 mL/min (ref >90)
GLUCOSE: 93 mg/dL (ref 70–99)
Potassium: 3.7 mEq/L (ref 3.7–5.3)
Sodium: 137 mEq/L (ref 137–147)

## 2014-01-21 LAB — CBC
HCT: 40.4 % (ref 39.0–52.0)
HEMOGLOBIN: 14.5 g/dL (ref 13.0–17.0)
MCH: 28.6 pg (ref 26.0–34.0)
MCHC: 35.9 g/dL (ref 30.0–36.0)
MCV: 79.7 fL (ref 78.0–100.0)
Platelets: 426 10*3/uL — ABNORMAL HIGH (ref 150–400)
RBC: 5.07 MIL/uL (ref 4.22–5.81)
RDW: 12.6 % (ref 11.5–15.5)
WBC: 7.8 10*3/uL (ref 4.0–10.5)

## 2014-01-21 MED ORDER — CLONIDINE HCL 0.1 MG PO TABS
0.1000 mg | ORAL_TABLET | Freq: Three times a day (TID) | ORAL | Status: DC
  Administered 2014-01-21 – 2014-01-22 (×4): 0.1 mg via ORAL
  Filled 2014-01-21 (×6): qty 1

## 2014-01-21 MED ORDER — METOCLOPRAMIDE HCL 5 MG PO TABS
5.0000 mg | ORAL_TABLET | Freq: Four times a day (QID) | ORAL | Status: DC
  Administered 2014-01-21 – 2014-01-22 (×4): 5 mg via ORAL
  Filled 2014-01-21 (×8): qty 1
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
## 2014-01-21 NOTE — Progress Notes (Signed)
Patient ID: [name], male   DOB: 04-06-1951, 63 y.o.   MRN: [id number]  General Surgery - [facility] - Progress Note  Subjective: Patient comfortable, no complaints.  Passing flatus, two BM's.  No nausea or emesis overnight.  Objective: Vital signs in last 24 hours: Temp:  [97.9 F (36.6 C)-98.3 F (36.8 C)] 97.9 F (36.6 C) (08/23 0532) Pulse Rate:  [55-75] 75 (08/23 0532) Resp:  [18-20] 20 (08/23 0532) BP: (163-175)/(76-89) 163/76 mmHg (08/23 0532) SpO2:  [94 %-100 %] 94 % (08/23 0753) Weight:  [207 lb 11.2 oz (94.212 kg)] 207 lb 11.2 oz (94.212 kg) (08/23 0645) Last BM Date: 2014-01-29  Intake/Output from previous day: 01/30/2023 0701 - 08/23 0700 In: 353 [I.V.:353] Out: 1525 [Urine:1525]  Exam: HEENT - clear, not icteric Neck - soft Chest - clear bilaterally Cor - RRR, no murmur Abd - soft without distension; BS present; minimal tenderness; incisions well healed with tiny incisional herniae, reducible Ext - no significant edema Neuro - grossly intact, no focal deficits  Lab Results:   Recent Labs  01/19/14 0022 01/21/14 0537  WBC 8.4 7.8  HGB 13.6 14.5  HCT 38.1* 40.4  PLT 361 426*     Recent Labs  01/19/14 0022 01/21/14 0537  NA 140 137  K 3.9 3.7  CL 103 97  CO2 20 26  GLUCOSE 89 93  BUN 14 11  CREATININE 0.69 0.76  CALCIUM 9.5 9.5    Studies/Results: Dg Abd 2 Views  01/29/14   CLINICAL DATA:  Small bowel obstruction  EXAM: ABDOMEN - 2 VIEW  COMPARISON:  Prior abdominal radiograph 01/18/2014  FINDINGS: The nasogastric tube has been removed. No dilated loops of small bowel to suggest obstruction. There is a small amount of gas within nondilated small bowel in the mid abdomen and left lower quadrant. Colonic gas is noted to the level of the rectum. Surgical clips are present in the GE junction and right upper quadrant. No free air. Mild left basilar opacity is unchanged compared to prior and may represent atelectasis or scarring.  No acute osseous abnormality.  IMPRESSION: 1. No evidence of bowel obstruction. 2. Nasogastric tube is been removed. 3. Persistent left basilar atelectasis versus scarring.   Electronically Signed   By: Jacqulynn Cadet M.D.   On: 01/29/2014 10:46    Assessment / Plan: 1.  Small bowel obstruction, abdominal pain - resolved  Advance to full liquid diet today  Encouraged OOB, ambulation  Will follow with you  Earnstine Regal, MD, [facility] Office: [phone]  01/21/2014

## 2014-01-22 MED ORDER — ALPRAZOLAM 1 MG PO TABS: 1.0000 mg | ORAL_TABLET | Freq: Three times a day (TID) | ORAL | Status: DC | PRN

## 2014-01-22 MED ORDER — PROMETHAZINE HCL 25 MG PO TABS: 25.0000 mg | ORAL_TABLET | Freq: Four times a day (QID) | ORAL | Status: DC | PRN

## 2014-01-22 MED ORDER — METOCLOPRAMIDE HCL 5 MG PO TABS: 5.0000 mg | ORAL_TABLET | Freq: Four times a day (QID) | ORAL | Status: DC

## 2014-01-22 MED ORDER — MORPHINE SULFATE 30 MG PO TABS: 30.0000 mg | ORAL_TABLET | ORAL | Status: DC | PRN

## 2014-01-22 MED ORDER — BISACODYL 10 MG RE SUPP: 10.0000 mg | Freq: Every day | RECTAL | Status: DC

## 2014-01-22 MED ORDER — MORPHINE SULFATE 30 MG PO TABS
30.0000 mg | ORAL_TABLET | ORAL | Status: DC | PRN
  Administered 2014-01-22: 30 mg via ORAL
  Filled 2014-01-22: qty 1

## 2014-01-22 NOTE — Progress Notes (Signed)
Patient given discharge instructions, and verbalized an understanding of all discharge instructions.  Patient agrees with discharge plan, and is being discharged in stable medical condition.  Patient given transportation via wheelchair.  Rhyse Skowron RN 
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
## 2014-01-22 NOTE — Discharge Instructions (Signed)
Small Bowel Obstruction °A small bowel obstruction is a blockage (obstruction) of the small intestine (small bowel). The small bowel is a long, slender tube that connects the stomach to the colon. Its job is to absorb nutrients from the fluids and foods you consume into the bloodstream.  °CAUSES  °There are many causes of intestinal blockage. The most common ones include: °· Hernias. This is a more common cause in children than adults. °· Inflammatory bowel disease (enteritis and colitis). °· Twisting of the bowel (volvulus). °· Tumors. °· Scar tissue (adhesions) from previous surgery or radiation treatment. °· Recent surgery. This may cause an acute small bowel obstruction called an ileus. °SYMPTOMS  °· Abdominal pain. This may be dull cramps or sharp pain. It may occur in one area or may be present in the entire abdomen. Pain can range from mild to severe, depending on the degree of obstruction. °· Nausea and vomiting. Vomit may be greenish or yellow bile color. °· Distended or swollen stomach. Abdominal bloating is a common symptom. °· Constipation. °· Lack of passing gas. °· Frequent belching. °· Diarrhea. This may occur if runny stool is able to leak around the obstruction. °DIAGNOSIS  °Your caregiver can usually diagnose small bowel obstruction by taking a history, doing a physical exam, and taking X-rays. If the cause is unclear, a CT scan (computerized tomography) of your abdomen and pelvis may be needed. °TREATMENT  °Treatment of the blockage depends on the cause and how bad the problem is.  °· Sometimes, the obstruction improves with bed rest and intravenous (IV) fluids. °· Resting the bowel is very important. This means following a simple diet. Sometimes, a clear liquid diet may be required for several days. °· Sometimes, a small tube (nasogastric tube) is placed into the stomach to decompress the bowel. When the bowel is blocked, it usually swells up like a balloon filled with air and fluids.  Decompression means that the air and fluids are removed by suction through that tube. This can help with pain, discomfort, and nausea. It can also help the obstruction resolve faster. °· Surgery may be required if other treatments do not work. Bowel obstruction from a hernia may require early surgery and can be an emergency procedure. Adhesions that cause frequent or severe obstructions may also require surgery. °HOME CARE INSTRUCTIONS °If your bowel obstruction is only partial or incomplete, you may be allowed to go home. °· Get plenty of rest. °· Follow your diet as directed by your caregiver. °· Only consume clear liquids until your condition improves. °· Avoid solid foods as instructed. °SEEK IMMEDIATE MEDICAL CARE IF: °· You have increased pain or cramping. °· You vomit blood. °· You have uncontrolled vomiting or nausea. °· You cannot drink fluids due to vomiting or pain. °· You develop confusion. °· You begin feeling very dry or thirsty (dehydrated). °· You have severe bloating. °· You have chills. °· You have a fever. °· You feel extremely weak or you faint. °MAKE SURE YOU: °· Understand these instructions. °· Will watch your condition. °· Will get help right away if you are not doing well or get worse. °Document Released: 08/04/2005 Document Revised: 08/10/2011 Document Reviewed: 08/01/2010 °ExitCare® Patient Information ©2015 [employer]. This information is not intended to replace advice given to you by your health care provider. Make sure you discuss any questions you have with your health care provider. ° °

## 2014-01-22 NOTE — Progress Notes (Signed)
  Subjective: Feels good. Denies pain. Begging for food. Lots of flatus. Had a stool yesterday.Tolerating full liquids.  Objective: Vital signs in last 24 hours: Temp:  [97.5 F (36.4 C)-98 F (36.7 C)] 97.8 F (36.6 C) (08/24 0500) Pulse Rate:  [52-58] 57 (08/24 0500) Resp:  [18-20] 20 (08/24 0500) BP: (135-149)/(68-94) 141/94 mmHg (08/24 0500) SpO2:  [96 %-99 %] 99 % (08/24 0500) Weight:  [203 lb 11.3 oz (92.4 kg)] 203 lb 11.3 oz (92.4 kg) (08/24 0500) Last BM Date: 01/21/14  Intake/Output from previous day: 08/23 0701 - 08/24 0700 In: 757 [P.O.:420; I.V.:337] Out: 1800 [Urine:1800] Intake/Output this shift:    General appearance: alert. Mental status normal. Friendly. No distress. Resp: clear to auscultation bilaterally GI: soft. Nondistended. Nontender. Bowel sounds active. Multiple scars. Tiny reducible incisional hernia, nontender.  Lab Results:   Recent Labs  01/21/14 0537  WBC 7.8  HGB 14.5  HCT 40.4  PLT 426*   BMET  Recent Labs  01/21/14 0537  NA 137  K 3.7  CL 97  CO2 26  GLUCOSE 93  BUN 11  CREATININE 0.76  CALCIUM 9.5   PT/INR No results found for this basename: LABPROT, INR,  in the last 72 hours ABG No results found for this basename: PHART, PCO2, PO2, HCO3,  in the last 72 hours  Studies/Results: Dg Abd 2 Views  February 02, 2014   CLINICAL DATA:  Small bowel obstruction  EXAM: ABDOMEN - 2 VIEW  COMPARISON:  Prior abdominal radiograph 01/18/2014  FINDINGS: The nasogastric tube has been removed. No dilated loops of small bowel to suggest obstruction. There is a small amount of gas within nondilated small bowel in the mid abdomen and left lower quadrant. Colonic gas is noted to the level of the rectum. Surgical clips are present in the GE junction and right upper quadrant. No free air. Mild left basilar opacity is unchanged compared to prior and may represent atelectasis or scarring. No acute osseous abnormality.  IMPRESSION: 1. No evidence of bowel  obstruction. 2. Nasogastric tube is been removed. 3. Persistent left basilar atelectasis versus scarring.   Electronically Signed   By: Jacqulynn Cadet M.D.   On: 02/02/2014 10:46    Anti-infectives: Anti-infectives   None      Assessment/Plan:  Small bowel obstruction. Obstructive symptoms and pain have resolved. Advanced to soft diet this morning. Probable discharge home later today No surgical followup required unless symptoms recur. We will sign off. Please reconsult if necessary.   LOS: 7 days    [name] M 01/22/2014

## 2014-01-30 ENCOUNTER — Inpatient Hospital Stay: Payer: Medicare Other | Admitting: Family Medicine

## 2014-02-12 ENCOUNTER — Encounter: Payer: Self-pay | Admitting: Family Medicine

## 2014-02-12 ENCOUNTER — Ambulatory Visit (INDEPENDENT_AMBULATORY_CARE_PROVIDER_SITE_OTHER): Payer: Medicare Other | Admitting: Family Medicine

## 2014-02-12 VITALS — BP 140/82 | HR 78 | Temp 98.0°F | Resp 18 | Ht 73.0 in | Wt 227.0 lb

## 2014-02-12 DIAGNOSIS — Z09 Encounter for follow-up examination after completed treatment for conditions other than malignant neoplasm: Secondary | ICD-10-CM
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
## 2014-02-13 LAB — CBC WITH DIFFERENTIAL/PLATELET
Basophils Absolute: 0 10*3/uL (ref 0.0–0.1)
Basophils Relative: 0 % (ref 0–1)
Eosinophils Absolute: 0.3 10*3/uL (ref 0.0–0.7)
Eosinophils Relative: 6 % — ABNORMAL HIGH (ref 0–5)
HCT: 37.9 % — ABNORMAL LOW (ref 39.0–52.0)
Hemoglobin: 13 g/dL (ref 13.0–17.0)
Lymphocytes Relative: 39 % (ref 12–46)
Lymphs Abs: 2.3 10*3/uL (ref 0.7–4.0)
MCH: 28 pg (ref 26.0–34.0)
MCHC: 34.3 g/dL (ref 30.0–36.0)
MCV: 81.5 fL (ref 78.0–100.0)
Monocytes Absolute: 0.4 10*3/uL (ref 0.1–1.0)
Monocytes Relative: 7 % (ref 3–12)
NEUTROS ABS: 2.8 10*3/uL (ref 1.7–7.7)
NEUTROS PCT: 48 % (ref 43–77)
Platelets: 354 10*3/uL (ref 150–400)
RBC: 4.65 MIL/uL (ref 4.22–5.81)
RDW: 14.5 % (ref 11.5–15.5)
WBC: 5.8 10*3/uL (ref 4.0–10.5)

## 2014-02-13 LAB — HEMOGLOBIN A1C
HEMOGLOBIN A1C: 5.8 % — AB (ref <5.7)
MEAN PLASMA GLUCOSE: 120 mg/dL — AB (ref <117)

## 2014-02-13 LAB — COMPLETE METABOLIC PANEL WITH GFR
ALT: 19 U/L (ref 0–53)
AST: 16 U/L (ref 0–37)
Albumin: 4 g/dL (ref 3.5–5.2)
Alkaline Phosphatase: 81 U/L (ref 39–117)
BILIRUBIN TOTAL: 0.8 mg/dL (ref 0.2–1.2)
BUN: 17 mg/dL (ref 6–23)
CO2: 25 meq/L (ref 19–32)
CREATININE: 0.65 mg/dL (ref 0.50–1.35)
Calcium: 9.6 mg/dL (ref 8.4–10.5)
Chloride: 102 mEq/L (ref 96–112)
GLUCOSE: 86 mg/dL (ref 70–99)
Potassium: 4.1 mEq/L (ref 3.5–5.3)
Sodium: 139 mEq/L (ref 135–145)
TOTAL PROTEIN: 7 g/dL (ref 6.0–8.3)

## 2014-02-14 ENCOUNTER — Encounter: Payer: Self-pay | Admitting: Family Medicine

## 2014-03-16 ENCOUNTER — Other Ambulatory Visit: Payer: Self-pay

## 2014-03-28 ENCOUNTER — Other Ambulatory Visit: Payer: Self-pay | Admitting: Family Medicine

## 2014-03-28 MED ORDER — HYOSCYAMINE SULFATE ER 0.375 MG PO TB12: 0.3750 mg | ORAL_TABLET | ORAL | Status: DC | PRN

## 2014-03-28 NOTE — Telephone Encounter (Signed)
Med sent to pharm 

## 2014-03-29 ENCOUNTER — Telehealth: Payer: Self-pay | Admitting: *Deleted

## 2014-03-29 NOTE — Telephone Encounter (Signed)
Received call from patient.   Reports that he and his wife are going to be moving to a new PCP.  MD/ Glass blower/designer made aware.

## 2014-04-19 ENCOUNTER — Ambulatory Visit: Payer: Medicare Other | Admitting: Internal Medicine

## 2014-05-10 ENCOUNTER — Encounter (HOSPITAL_COMMUNITY): Payer: Self-pay | Admitting: Cardiovascular Disease

## 2014-06-18 ENCOUNTER — Other Ambulatory Visit (HOSPITAL_COMMUNITY): Payer: Self-pay | Admitting: Cardiology

## 2014-06-18 DIAGNOSIS — I6523 Occlusion and stenosis of bilateral carotid arteries: Secondary | ICD-10-CM

## 2014-06-20 ENCOUNTER — Telehealth: Payer: Self-pay | Admitting: Cardiology

## 2014-06-20 NOTE — Telephone Encounter (Signed)
New MEssage  Pt wants to speak w/ Rn about BP medication. Said if mobile isnt available, work number will work after [employer]. Please call back and discuss.

## 2014-06-21 MED ORDER — CARVEDILOL 25 MG PO TABS: 25.0000 mg | ORAL_TABLET | Freq: Every day | ORAL | Status: DC

## 2014-06-21 MED ORDER — VALSARTAN-HYDROCHLOROTHIAZIDE 320-12.5 MG PO TABS: 1.0000 | ORAL_TABLET | Freq: Every day | ORAL | Status: DC

## 2014-06-21 NOTE — Telephone Encounter (Signed)
Patient states he has an appointment in 08/2014 Needs refill on coreg and valsartan-hctz Refills  sent to [facility]  #90 X 1 refill each  Patient aware

## 2014-06-25 ENCOUNTER — Encounter (HOSPITAL_COMMUNITY): Payer: Self-pay

## 2014-06-26 ENCOUNTER — Telehealth (HOSPITAL_COMMUNITY): Payer: Self-pay | Admitting: *Deleted

## 2014-06-28 ENCOUNTER — Other Ambulatory Visit (HOSPITAL_COMMUNITY): Payer: Self-pay | Admitting: Family Medicine

## 2014-06-28 ENCOUNTER — Telehealth: Payer: Self-pay | Admitting: Cardiology

## 2014-06-28 DIAGNOSIS — I6523 Occlusion and stenosis of bilateral carotid arteries: Secondary | ICD-10-CM

## 2014-06-28 NOTE — Telephone Encounter (Signed)
Pt called in wanting to let Dr.Harding know that due to his insurance change valsartan is not on the formulary for [place]. He would like an alternative drug . Please follow up with the pt. He did say that the insurance company would be faxing over a list of medications covered under the policy  Thanks

## 2014-06-28 NOTE — Telephone Encounter (Signed)
Returned call to patient he stated his insurance no longer covers valsartan.Advised Dr.Harding out of office will send message to him for advice.

## 2014-06-28 NOTE — Telephone Encounter (Signed)
I find it hard to believe that generic Valsartan-HCTZ is not covered. Since I will not be in the office for > 1 week - I am cc: ing Erasmo Downer to ask if she can check the list & pick out an acceptable alternative (I think Losartan was just not strong enough).  I am OK with no HCTZ if necessary. Was avoiding ACE-I b/c of h/o cough.  Thanks,   [facility]

## 2014-07-02 MED ORDER — IRBESARTAN-HYDROCHLOROTHIAZIDE 300-12.5 MG PO TABS: 1.0000 | ORAL_TABLET | Freq: Every day | ORAL | Status: DC

## 2014-07-02 NOTE — Telephone Encounter (Signed)
All the insurers are requiring PAs for lots of generics this year, especially ARBs.  I switched him to irbesartan hctz 300/12.5.  It was covered on the last one I tried.  He states his pressures have been close to 200/100, so I'm going to see him in about 2-3 weeks and see if we cant get better control

## 2014-07-02 NOTE — Telephone Encounter (Signed)
Can this encounter be closed?

## 2014-07-02 NOTE — Telephone Encounter (Signed)
As usual - you rock.   Thanks, - just don't have any clinic spaces available after that snow day.  [place]

## 2014-07-06 ENCOUNTER — Telehealth: Payer: Self-pay | Admitting: Internal Medicine

## 2014-07-06 MED ORDER — AZITHROMYCIN 250 MG PO TABS: ORAL_TABLET | ORAL | Status: DC

## 2014-07-06 NOTE — Telephone Encounter (Signed)
Zpak, to UC or ER if not better and will need to see Dr Annamaria Boots prior to any more phone medications as hasn't been seen in a year

## 2014-07-06 NOTE — Telephone Encounter (Signed)
Pt's wife aware of recs.  Med sent in to pharmacy.  Declined to make a rov with CY at this time.  Nothing further needed.

## 2014-07-06 NOTE — Telephone Encounter (Signed)
Spoke with pt's wife, states pt started with a stuffy nose that has turned into a sinus infection, prod cough with tan/white mucus, wheezing, sob, Temp 99-100 X1 week. Is requesting recs.    Last ov: 07/21/13 Next ov: none  Sending to doc of day as CY is unavailable this afternoon.  MW please advise.  Thank you

## 2014-07-10 ENCOUNTER — Encounter (HOSPITAL_COMMUNITY): Payer: Medicare Other

## 2014-07-18 ENCOUNTER — Telehealth: Payer: Self-pay | Admitting: Internal Medicine

## 2014-07-18 MED ORDER — AMOXICILLIN-POT CLAVULANATE 875-125 MG PO TABS: 1.0000 | ORAL_TABLET | Freq: Two times a day (BID) | ORAL | Status: DC

## 2014-07-18 NOTE — Telephone Encounter (Signed)
Script sent for augmentin

## 2014-07-25 ENCOUNTER — Ambulatory Visit: Payer: Medicare Other | Admitting: Pharmacist Clinician (PhC)/ Clinical Pharmacy Specialist

## 2014-08-03 ENCOUNTER — Telehealth: Payer: Self-pay | Admitting: Internal Medicine

## 2014-08-03 DIAGNOSIS — J209 Acute bronchitis, unspecified: Secondary | ICD-10-CM

## 2014-08-03 MED ORDER — PREDNISONE 10 MG PO TABS: 10.0000 mg | ORAL_TABLET | Freq: Every day | ORAL | Status: DC

## 2014-08-03 NOTE — Telephone Encounter (Signed)
Left message with wife to have pt call back. Rx sent to preferred pharmacy.

## 2014-08-03 NOTE — Telephone Encounter (Signed)
Pt returned call - [phone]

## 2014-08-03 NOTE — Telephone Encounter (Signed)
Called and spoke to pt. Informed pt of the recs per CY. Order placed. Pt verbalized understanding and denied any further questions or concerns at this time.

## 2014-08-03 NOTE — Telephone Encounter (Signed)
Pt says it ok to speak with wife, pt trying to get his car fixed and unable to pick up phone [phone]

## 2014-08-03 NOTE — Telephone Encounter (Signed)
Called and spoke to pt. Pt stated he is still not feeling well. Pt completed the zpak from 07-06-14 and then augmentin from 07-18-14. Pt completed pred about a week ago. Pt stated he is still having prod cough with clear mucus (use to be green and yellow), orthopnea, increase in SOB from baseline, chest congestion. Pt denies CP/tightness, f/c/s. Pt's sats are mid 90's on RA. Pt requesting recs.  CY please advise.   Allergies  Allergen Reactions  . Ivp Dye [Iodinated Diagnostic Agents]     Omni - Paque Contrast   . Oxycodone Hcl Shortness Of Breath and Rash  . Atorvastatin     REACTION: myalgias  . Bystolic [Nebivolol Hcl]     Nightmares, flashbacks  . Codeine     rash  . Doxycycline     Rash, SOB  . Iohexol      Desc: hives,neck and torso erythemia   . Methocarbamol     rash  . Morphine And Related     Pt is on out patient morphine not allergic to morphine  . Pentazocine Lactate     rash  . Succinylcholine Chloride Other (See Comments)    unknown  . Synvisc [Hylan G-F 20]     rash  . Butorphanol Tartrate Rash  . Dilaudid [Hydromorphone Hcl] Nausea Only and Rash    Aggressive     Current Outpatient Prescriptions on File Prior to Visit  Medication Sig Dispense Refill  . albuterol (PROVENTIL HFA) 108 (90 BASE) MCG/ACT inhaler Inhale 2 puffs into the lungs every 6 (six) hours as needed. For wheezing and/or shortness of breath     . ALPRAZolam (XANAX) 1 MG tablet Take 1 tablet (1 mg total) by mouth 3 (three) times daily as needed for anxiety or sleep. 90 tablet 0  . aspirin 81 MG chewable tablet Chew 81 mg by mouth daily.    . carvedilol (COREG) 25 MG tablet Take 1 tablet (25 mg total) by mouth daily. 90 tablet 1  . cloNIDine (CATAPRES) 0.1 MG tablet Take 1 tablet (0.1 mg total) by mouth 3 (three) times daily. 90 tablet 3  . hyoscyamine (LEVBID) 0.375 MG 12 hr tablet Take 1 tablet (0.375 mg total) by mouth as needed. For stomach cramps 60 tablet 3  . irbesartan-hydrochlorothiazide  (AVALIDE) 300-12.5 MG per tablet Take 1 tablet by mouth daily. 30 tablet 5  . Linaclotide (LINZESS) 145 MCG CAPS capsule Take 1 capsule (145 mcg total) by mouth daily. 30 capsule 11  . metoCLOPramide (REGLAN) 5 MG tablet Take 1 tablet (5 mg total) by mouth every 6 (six) hours. 120 tablet 1  . morphine (MSIR) 30 MG tablet Take 1 tablet (30 mg total) by mouth every 4 (four) hours as needed. FOR PAIN 120 tablet 0  . Multiple Vitamins-Minerals (CENTRUM SILVER PO) Take 1 capsule by mouth daily.    . nitroGLYCERIN (NITROSTAT) 0.4 MG SL tablet Place 0.4 mg under the tongue every 5 (five) minutes as needed for chest pain.    [name] Glycol-Propyl Glycol (SYSTANE) 0.4-0.3 % SOLN Apply 1 drop to eye 2 (two) times daily as needed.    . promethazine (PHENERGAN) 25 MG tablet Take 1 tablet (25 mg total) by mouth every 6 (six) hours as needed. FOR NAUSEA 90 tablet 0  . tiotropium (SPIRIVA) 18 MCG inhalation capsule Place 1 capsule (18 mcg total) into inhaler and inhale daily. 90 capsule 4  . valsartan-hydrochlorothiazide (DIOVAN-HCT) 320-12.5 MG per tablet Take 1 tablet by mouth daily. [place]  tablet 1   No current facility-administered medications on file prior to visit.

## 2014-08-03 NOTE — Telephone Encounter (Signed)
Attempted to call patient back, busy x 2.

## 2014-08-03 NOTE — Telephone Encounter (Signed)
Sounds as if the antibiotics did their job for the bacterial part of this slowly clearing bronchitis.  Offer outpatient CXR for dx acute bronchitis and suggest prednisone 10 mg, # 10, 1 daily Can also take Mucinex DM

## 2014-08-03 NOTE — Telephone Encounter (Signed)
lmtcb for pt.  

## 2014-08-06 ENCOUNTER — Ambulatory Visit (INDEPENDENT_AMBULATORY_CARE_PROVIDER_SITE_OTHER)
Admission: RE | Admit: 2014-08-06 | Discharge: 2014-08-06 | Disposition: A | Discharge status: Home / Self Care | Payer: Medicare Other | Source: Ambulatory Visit | Attending: Internal Medicine | Admitting: Internal Medicine

## 2014-08-06 DIAGNOSIS — J209 Acute bronchitis, unspecified: Secondary | ICD-10-CM

## 2014-08-09 ENCOUNTER — Telehealth: Payer: Self-pay | Admitting: Internal Medicine

## 2014-08-09 NOTE — Telephone Encounter (Signed)
Notes Recorded by Deneise Lever, MD on 08/06/2014 at 8:12 PM CXR- normal, no active problem seen -- Pt is aware of results.

## 2014-08-27 ENCOUNTER — Telehealth: Payer: Self-pay | Admitting: Internal Medicine

## 2014-08-27 MED ORDER — PREDNISONE 10 MG PO TABS: ORAL_TABLET | ORAL | Status: DC

## 2014-08-27 NOTE — Telephone Encounter (Signed)
Called and spoke to pt. Pt stated he has completed his pred. Pt c/o increase in SOB (while speaking on phone, pt was audibly dyspneic), prod cough with yellow mucus plugs, wheezing, orthopnea. Pt stated he is taking the albuterol neb q6h. Pt denies f/c/s. Pt stated his sats are around 96%. Pt last seen 07/2013 with 1 year f/u. Pt has no active f/u at this time.   CY please advise.   Allergies  Allergen Reactions  . Ivp Dye [Iodinated Diagnostic Agents]     Omni - Paque Contrast   . Oxycodone Hcl Shortness Of Breath and Rash  . Atorvastatin     REACTION: myalgias  . Bystolic [Nebivolol Hcl]     Nightmares, flashbacks  . Codeine     rash  . Doxycycline     Rash, SOB  . Iohexol      Desc: hives,neck and torso erythemia   . Methocarbamol     rash  . Morphine And Related     Pt is on out patient morphine not allergic to morphine  . Pentazocine Lactate     rash  . Succinylcholine Chloride Other (See Comments)    unknown  . Synvisc [Hylan G-F 20]     rash  . Butorphanol Tartrate Rash  . Dilaudid [Hydromorphone Hcl] Nausea Only and Rash    Aggressive    Current Outpatient Prescriptions on File Prior to Visit  Medication Sig Dispense Refill  . albuterol (PROVENTIL HFA) 108 (90 BASE) MCG/ACT inhaler Inhale 2 puffs into the lungs every 6 (six) hours as needed. For wheezing and/or shortness of breath     . albuterol (PROVENTIL) (2.5 MG/3ML) 0.083% nebulizer solution Take 2.5 mg by nebulization every 6 (six) hours as needed for wheezing or shortness of breath.    . ALPRAZolam (XANAX) 1 MG tablet Take 1 tablet (1 mg total) by mouth 3 (three) times daily as needed for anxiety or sleep. 90 tablet 0  . aspirin 81 MG chewable tablet Chew 81 mg by mouth daily.    . carvedilol (COREG) 25 MG tablet Take 1 tablet (25 mg total) by mouth daily. 90 tablet 1  . cloNIDine (CATAPRES) 0.1 MG tablet Take 1 tablet (0.1 mg total) by mouth 3 (three) times daily. 90 tablet 3  . hyoscyamine (LEVBID) 0.375 MG  12 hr tablet Take 1 tablet (0.375 mg total) by mouth as needed. For stomach cramps 60 tablet 3  . irbesartan-hydrochlorothiazide (AVALIDE) 300-12.5 MG per tablet Take 1 tablet by mouth daily. 30 tablet 5  . Linaclotide (LINZESS) 145 MCG CAPS capsule Take 1 capsule (145 mcg total) by mouth daily. 30 capsule 11  . metoCLOPramide (REGLAN) 5 MG tablet Take 1 tablet (5 mg total) by mouth every 6 (six) hours. 120 tablet 1  . morphine (MSIR) 30 MG tablet Take 1 tablet (30 mg total) by mouth every 4 (four) hours as needed. FOR PAIN 120 tablet 0  . Multiple Vitamins-Minerals (CENTRUM SILVER PO) Take 1 capsule by mouth daily.    . nitroGLYCERIN (NITROSTAT) 0.4 MG SL tablet Place 0.4 mg under the tongue every 5 (five) minutes as needed for chest pain.    [name] Glycol-Propyl Glycol (SYSTANE) 0.4-0.3 % SOLN Apply 1 drop to eye 2 (two) times daily as needed.    . promethazine (PHENERGAN) 25 MG tablet Take 1 tablet (25 mg total) by mouth every 6 (six) hours as needed. FOR NAUSEA 90 tablet 0  . tiotropium (SPIRIVA) 18 MCG inhalation capsule Place 1 capsule (  18 mcg total) into inhaler and inhale daily. 90 capsule 4  . valsartan-hydrochlorothiazide (DIOVAN-HCT) 320-12.5 MG per tablet Take 1 tablet by mouth daily. 90 tablet 1   No current facility-administered medications on file prior to visit.

## 2014-08-27 NOTE — Telephone Encounter (Signed)
Spoke with the pt and notified of recs per CDY  He verbalized understanding  Rx was sent to pharm  appt made with CDY for 10/09/14 at 9:45 am

## 2014-08-27 NOTE — Telephone Encounter (Signed)
Offer prednisone 10 mg, # 15, 1 daily x 5 days, then one every other day Please schedule routine ROV

## 2014-09-04 ENCOUNTER — Ambulatory Visit (INDEPENDENT_AMBULATORY_CARE_PROVIDER_SITE_OTHER): Payer: Medicare Other | Admitting: Cardiology

## 2014-09-04 VITALS — BP 140/90 | HR 77 | Ht 73.0 in | Wt 244.3 lb

## 2014-09-04 DIAGNOSIS — E785 Hyperlipidemia, unspecified: Secondary | ICD-10-CM

## 2014-09-04 DIAGNOSIS — I6523 Occlusion and stenosis of bilateral carotid arteries: Secondary | ICD-10-CM

## 2014-09-04 DIAGNOSIS — I350 Nonrheumatic aortic (valve) stenosis: Secondary | ICD-10-CM

## 2014-09-04 DIAGNOSIS — R079 Chest pain, unspecified: Secondary | ICD-10-CM

## 2014-09-04 DIAGNOSIS — R0609 Other forms of dyspnea: Secondary | ICD-10-CM

## 2014-09-04 DIAGNOSIS — R0600 Dyspnea, unspecified: Secondary | ICD-10-CM

## 2014-09-04 DIAGNOSIS — I10 Essential (primary) hypertension: Secondary | ICD-10-CM

## 2014-09-04 NOTE — Patient Instructions (Addendum)
SCHEDULE I June 2016---Your physician has requested that you have an echocardiogram. Echocardiography is a painless test that uses sound waves to create images of your heart. It provides your doctor with information about the size and shape of your heart and how well your heart's chambers and valves are working. This procedure takes approximately one hour. There are no restrictions for this procedure.  SCHEDULE IN June 2016---Your physician has requested that you have a carotid duplex. This test is an ultrasound of the carotid arteries in your neck. It looks at blood flow through these arteries that supply the brain with blood. Allow one hour for this exam. There are no restrictions or special instructions.    Your physician wants you to follow-up in [place]--- 30 MIN APPOINTMENT   You will receive a reminder letter in the mail two months in advance. If you don't receive a letter, please call our office to schedule the follow-up appointment.

## 2014-09-06 ENCOUNTER — Encounter: Payer: Self-pay | Admitting: Cardiology

## 2014-09-06 DIAGNOSIS — I6529 Occlusion and stenosis of unspecified carotid artery: Secondary | ICD-10-CM

## 2014-09-06 DIAGNOSIS — R0600 Dyspnea, unspecified: Secondary | ICD-10-CM | POA: Insufficient documentation

## 2014-09-06 DIAGNOSIS — R0609 Other forms of dyspnea: Secondary | ICD-10-CM

## 2014-09-06 HISTORY — DX: Occlusion and stenosis of unspecified carotid artery: I65.29

## 2014-09-06 NOTE — Progress Notes (Signed)
PCP: Odette Fraction, MD  Clinic Note:  Chief Complaint  Patient presents with  . 18 mo rov   HPI: [name] is a 64 y.o. male with a PMH below who presents today for followup of labile hypertension with a clinical diagnosis in the past of CHF. He also is mild aortic stenosis by echocardiogram. He's actually had a negative Myoview and a relatively normal cardiac catheterization. 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 He also had combat wounds of his abdomen that required significant abdominal surgeries including a redo [place]. He has had frequent episodes of hospitalizations for bowel obstruction, but nurses have been reluctant to consider open correction. I last saw him in September 2014 with plans for him to followup in 6 months.  Interval History: since his last visit he was hospitalized in September to October would she is mobile structure in that was treated expectantly with NG tube placement. He then subsequently had a pneumonia treated with outpatient antibiotics. He has not been trouble  Dealing with all the pollen of glass couple weeks.  Current conditions: today his main concern is related to persistent swelling in his left greater than right leg. Otherwise from cardiac standpoint he really does not have any specific symptoms of chest tightness or pressure with rest or exertion. He does get some exertional dyspnea but this is baseline. He intermittently has musculoskeletal type chest discomfort they're basically aches and pains not necessarily associated with rest or exertion. He does not sleep flat for comfort reasons. No signs of PND orthopnea pedal edema. Intermittent skipping of beats but nothing consistent with rapid arrhythmia. No frequent palpitations.  Cardiovascular ROS: positive for - intermittent chest pain,dyspnea on exertion, edema, palpitations, shortness of breath and Shortness of breath is mostly  related to significant allergens. Cervical exam 1 episode of what sounded like orthostatic hypotension related syncope in November..  his edema is usually controlled with PRN Lasix. negative for - loss of consciousness, orthopnea, paroxysmal nocturnal dyspnea or TIA/amaurosis fugax  Past Medical History  Diagnosis Date  . Diabetes mellitus   . Dyslipidemia (high LDL; low HDL)     statin intolerant; on fibrate  . Hypertension     very labile  . GERD (gastroesophageal reflux disease)   . Asthma   . Stroke   . Colon polyps   . Anemia   . Depression   . Osteoarthritis   . Clotting disorder     LLE DVT (also small vessel)  . COPD (chronic obstructive pulmonary disease)   . Neuromuscular disorder     Agent orange; Peripheral Neuroapthy  . Hearing loss   . Fainting   . Weakness   . Small bowel obstruction 2001    s/p multiple bowel surgeries; from war wounds  . Seizure   . Right shoulder injury   . Left knee injury   . Back pain, chronic, followed at pain clinic 11/25/2011  . Family history of acute myocardial infarction. and premature CAD 11/25/2011  . Nephrolithiasis   . Mild aortic stenosis by prior echocardiogram 03/2012    moderate Sclerosis.  . Chest pain with low risk for cardiac etiology 10/2011    Non-obstructive CAD by Cath; negative Lexiscan in 08/2011  . Edema leg     Wears compression stockings; 08/2012 dopplers - no DVT or thrombophlebitis; mild R Popliteal V reflux - no VNUS ablation candidate  . Dementia     Mild  Prior Cardiac Evaluation and Past Surgical History: Past Surgical History  Procedure Laterality Date  . Joint replacement      right knee replaced 2x  . Colonoscopy    . Exploratory laparotomy with extensive lysis of adhesions  10/1999  . Small bowel obstruction  1996  . Partial small bowel obstruction  1999  . Lexiscan myoview  08/2011    Negative  . Doppler echocardiography  2012    Essentially normal wirth moderate concentric LVH and moderate  aortic sclerosis   . Cardiac catheterization  11/26/2011    Normal  . Total knee arthroplasty    . Left heart catheterization with coronary angiogram N/A 11/26/2011    Procedure: LEFT HEART CATHETERIZATION WITH CORONARY ANGIOGRAM;  Surgeon: Lorretta Harp, MD;  Location: [facility] CATH LAB;  Service: Cardiovascular;  Laterality: N/A;    ROS: A comprehensive was performed. Review of Systems  Constitutional: Positive for weight loss (Significant fluctuations up and down depending on his GI condition.) and malaise/fatigue.  HENT: Positive for congestion. Negative for nosebleeds.   Respiratory: Positive for cough, shortness of breath (On occasion with exertion or with significant allergies now. He is gotten over pneumonia but is now dealing with significant allergy symptoms of mucus and coughing.) and wheezing. Negative for hemoptysis.        He uses CPAP at night  Cardiovascular: Positive for chest pain, palpitations and leg swelling.       Per history of present illness  Gastrointestinal: Positive for heartburn and abdominal pain (Intermittent related to adhesions.  He did have significant episodes will have a couple days without bowel movements. He notes that when these occur he can significantly increase his weight and then once he clears the weight goes down.). Negative for blood in stool and melena.  Genitourinary: Negative for hematuria.  Musculoskeletal: Positive for myalgias, joint pain and neck pain.  Neurological: Positive for dizziness, loss of consciousness (1 near syncope episode) and headaches (with allergies).       Lower extremity neuropathy  Psychiatric/Behavioral: The patient is nervous/anxious.   All other systems reviewed and are negative.   Current Outpatient Prescriptions on File Prior to Visit  Medication Sig Dispense Refill  . albuterol (PROVENTIL HFA) 108 (90 BASE) MCG/ACT inhaler Inhale 2 puffs into the lungs every 6 (six) hours as needed. For wheezing and/or shortness of  breath     . albuterol (PROVENTIL) (2.5 MG/3ML) 0.083% nebulizer solution Take 2.5 mg by nebulization every 6 (six) hours as needed for wheezing or shortness of breath.    . ALPRAZolam (XANAX) 1 MG tablet Take 1 tablet (1 mg total) by mouth 3 (three) times daily as needed for anxiety or sleep. 90 tablet 0  . aspirin 81 MG chewable tablet Chew 81 mg by mouth daily.    . carvedilol (COREG) 25 MG tablet Take 1 tablet (25 mg total) by mouth daily. 90 tablet 1  . cloNIDine (CATAPRES) 0.1 MG tablet Take 1 tablet (0.1 mg total) by mouth 3 (three) times daily. 90 tablet 3  . hyoscyamine (LEVBID) 0.375 MG 12 hr tablet Take 1 tablet (0.375 mg total) by mouth as needed. For stomach cramps 60 tablet 3  . irbesartan-hydrochlorothiazide (AVALIDE) 300-12.5 MG per tablet Take 1 tablet by mouth daily. 30 tablet 5  . metoCLOPramide (REGLAN) 5 MG tablet Take 1 tablet (5 mg total) by mouth every 6 (six) hours. 120 tablet 1  . morphine (MSIR) 30 MG tablet Take 1 tablet (30 mg total) by mouth  every 4 (four) hours as needed. FOR PAIN 120 tablet 0  . Multiple Vitamins-Minerals (CENTRUM SILVER PO) Take 1 capsule by mouth daily.    . nitroGLYCERIN (NITROSTAT) 0.4 MG SL tablet Place 0.4 mg under the tongue every 5 (five) minutes as needed for chest pain.    [name] Glycol-Propyl Glycol (SYSTANE) 0.4-0.3 % SOLN Apply 1 drop to eye 2 (two) times daily as needed.    . predniSONE (DELTASONE) 10 MG tablet 1 x 5 days, then 1 qod until gone 15 tablet 0  . promethazine (PHENERGAN) 25 MG tablet Take 1 tablet (25 mg total) by mouth every 6 (six) hours as needed. FOR NAUSEA 90 tablet 0  . tiotropium (SPIRIVA) 18 MCG inhalation capsule Place 1 capsule (18 mcg total) into inhaler and inhale daily. 90 capsule 4   No current facility-administered medications on file prior to visit.   Allergies  Allergen Reactions  . Ivp Dye [Iodinated Diagnostic Agents]     Omni - Paque Contrast   . Oxycodone Hcl Shortness Of Breath and Rash  .  Atorvastatin     REACTION: myalgias  . Bystolic [Nebivolol Hcl]     Nightmares, flashbacks  . Codeine     rash  . Doxycycline     Rash, SOB  . Iohexol      Desc: hives,neck and torso erythemia   . Methocarbamol     rash  . Morphine And Related     Pt is on out patient morphine not allergic to morphine  . Pentazocine Lactate     rash  . Succinylcholine Chloride Other (See Comments)    unknown  . Synvisc [Hylan G-F 20]     rash  . Butorphanol Tartrate Rash  . Dilaudid [Hydromorphone Hcl] Nausea Only and Rash    Aggressive    History  Substance Use Topics  . Smoking status: Never Smoker   . Smokeless tobacco: Never Used  . Alcohol Use: No   History   Social History  . Marital Status: Married    Spouse Name: N/A  . Number of Children: 3  . Years of Education: N/A   Occupational History  . Disabled    Social History Main Topics  . Smoking status: Never Smoker   . Smokeless tobacco: Never Used  . Alcohol Use: No  . Drug Use: No  . Sexual Activity: Not on file   Other Topics Concern  . Not on file   Social History Narrative   He is a father of 30, grandfather 3. One of his sons was killed during the 77 in [place]. 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 He was exposed to [employer].   Not very physically active bed due to extreme disability/debilitation from osteoarthritis and peripheral neuropathy.   has had several psychosocial depressing events: There is a loss of daughter to long bout of leukemia. He hasn't nephew with hepatitis C who just died. Her law passed away pancreatic cancer his brother has been trouble with CHF. Lots of stress.   Family History  Problem Relation Age of Onset  . Stroke Mother   . Heart disease Mother   . Heart attack Mother   . Heart disease Father   . Heart attack Father   . Heart attack Sister   . Heart attack Brother   . Hypertension Brother   . Heart attack Son   . Stroke Son   .  Hypertension Brother   .  Sleep apnea Son   . Non-Hodgkin's lymphoma Daughter    Wt Readings from Last 3 Encounters:  09/04/14 244 lb 4.8 oz (110.814 kg)  02/12/14 227 lb (102.967 kg)  01/22/14 203 lb 11.3 oz (92.4 kg)    PHYSICAL EXAM BP 140/90 mmHg  Pulse 77  Ht 6\' 1"  (1.854 m)  Wt 244 lb 4.8 oz (110.814 kg)  BMI 32.24 kg/m2 General appearance: A&Ox3, cooperative, appears stated age, no distress and mildly obese Neck: no LAN, no carotid bruit, no JVD, supple, symmetrical, trachea midline and thyroid not enlarged, symmetric, no tenderness/mass/nodules Lungs: CTA B., normal percussion bilaterally and Nonlabored Heart: regular rate and rhythm, S1, S2 normal, no S3 or S4, systolic murmur: systolic ejection 2/6, crescendo, decrescendo and harsh at 2nd right intercostal space, radiates to carotids, no click and no rub Abdomen: normal findings: bowel sounds normal, liver span normal to percussion, no masses palpable and no organomegaly and abnormal findings: incisional hernia and extensive scars from his multiple surgeries. There is some tenderness along these areas but not overall. Extremities: no noteable erythema; + venous stasis dermatitis noted; LLE 2+ edema, RLE 1+   Adult ECG Report  Rate: 77 ;  Rhythm: normal sinus rhythm  Narrative Interpretation: normal EKG with normal axis, intervals, durations and voltage  Recent Labs:  No recent labs   ASSESSMENT / PLAN: Problem List Items Addressed This Visit    Carotid artery stenosis    He was due for followup carotid Dopplers this winter, but with all of his pneumonia and congestion he opted out of coming in. We will have to have that done when he comes in for his echocardiogram.      Relevant Orders   EKG 12-Lead (Completed)   2D Echocardiogram without contrast   Doppler carotid   Chest pain with low risk for cardiac etiology - Primary    He has been having these chronic intermittent chest pain episodes for several years. Was  evaluated in 2013 with a Myoview followed by cardiac catheterization both of which did not showing significant disease. Unlikely that he has developed CAD now. No further evaluation besides echocardiogram      Relevant Orders   EKG 12-Lead (Completed)   2D Echocardiogram without contrast   Doppler carotid   DOE (dyspnea on exertion) (Chronic)    Most likely multifactorial. Exacerbated by allergies now. He is on pulmonary medications. Check echocardiogram to assess pulmonary pressures in the setting of mild aortic valve disease.      Relevant Orders   EKG 12-Lead (Completed)   2D Echocardiogram without contrast   Doppler carotid   Essential hypertension (Chronic)    Stable pressure. In the past he has had labile blood pressures. Stay consistent with no changes.      Hyperlipidemia with target LDL less than 100 (Chronic)    On statin plus fenofibrate. Followed by PCP. Should be due for labs to be checked soon.      Mild aortic stenosis by prior echocardiogram (Chronic)    Still with mild stenosis by last echocardiogram. It should not cause symptoms.   Plan: Followup echocardiogram To be performed this summer - to follow progression of disease         Orders Placed This Encounter  Procedures  . EKG 12-Lead  . 2D Echocardiogram without contrast    Standing Status: Future     Number of Occurrences:      Standing Expiration Date: 09/04/2015    Order Specific Question:  Type  of Echo    Answer:  Complete    Order Specific Question:  Where should this test be performed    Answer:  [facility]    Order Specific Question:  Reason for exam-Echo    Answer:  Dyspnea  786.09 / R06.00  . Doppler carotid    Standing Status: Future     Number of Occurrences:      Standing Expiration Date: 09/04/2015    Order Specific Question:  Laterality    Answer:  Bilateral    Order Specific Question:  Where should this test be performed:    Answer:  [facility]   No orders of the  defined types were placed in this encounter.     Followup: 6 months    HARDING, Leonie Green, M.D., M.S. Interventional Cardiologist   Pager # [phone]

## 2014-09-06 NOTE — Assessment & Plan Note (Signed)
Stable pressure. In the past he has had labile blood pressures. Stay consistent with no changes.

## 2014-09-06 NOTE — Assessment & Plan Note (Signed)
He was due for followup carotid Dopplers this winter, but with all of his pneumonia and congestion he opted out of coming in. We will have to have that done when he comes in for his echocardiogram.

## 2014-09-06 NOTE — Assessment & Plan Note (Signed)
On statin plus fenofibrate. Followed by PCP. Should be due for labs to be checked soon.

## 2014-09-06 NOTE — Assessment & Plan Note (Signed)
He has been having these chronic intermittent chest pain episodes for several years. Was evaluated in 2013 with a Myoview followed by cardiac catheterization both of which did not showing significant disease. Unlikely that he has developed CAD now. No further evaluation besides echocardiogram

## 2014-09-06 NOTE — Assessment & Plan Note (Signed)
Still with mild stenosis by last echocardiogram. It should not cause symptoms.   Plan: Followup echocardiogram To be performed this summer - to follow progression of disease

## 2014-09-06 NOTE — Assessment & Plan Note (Signed)
Most likely multifactorial. Exacerbated by allergies now. He is on pulmonary medications. Check echocardiogram to assess pulmonary pressures in the setting of mild aortic valve disease.

## 2014-09-14 ENCOUNTER — Telehealth: Payer: Self-pay | Admitting: Internal Medicine

## 2014-09-14 MED ORDER — AMOXICILLIN-POT CLAVULANATE 875-125 MG PO TABS: 1.0000 | ORAL_TABLET | Freq: Two times a day (BID) | ORAL | Status: DC

## 2014-09-14 NOTE — Telephone Encounter (Signed)
Pt is aware of CY's recommendation. Rx has been sent in. Nothing further was needed. 

## 2014-09-14 NOTE — Telephone Encounter (Signed)
Spoke with pt, c/o wheezing, prod cough with yellow mucus, intermittent sinus congestion, chest tightness when laying down.  Denies fever.   Has been using neb treatments and nasal spray regularly.   Pt uses [facility] in [place].  Pt is requesting recs, would prefer not to be on prednisone- states it makes him mean.  Last ov: 07/21/13 Next ov: 10/09/14  Dr. Annamaria Boots please advise.  Thanks!  Allergies  Allergen Reactions  . Ivp Dye [Iodinated Diagnostic Agents]     Omni - Paque Contrast   . Oxycodone Hcl Shortness Of Breath and Rash  . Atorvastatin     REACTION: myalgias  . Bystolic [Nebivolol Hcl]     Nightmares, flashbacks  . Codeine     rash  . Doxycycline     Rash, SOB  . Iohexol      Desc: hives,neck and torso erythemia   . Methocarbamol     rash  . Morphine And Related     Pt is on out patient morphine not allergic to morphine  . Pentazocine Lactate     rash  . Succinylcholine Chloride Other (See Comments)    unknown  . Synvisc [Hylan G-F 20]     rash  . Butorphanol Tartrate Rash  . Dilaudid [Hydromorphone Hcl] Nausea Only and Rash    Aggressive    Current Outpatient Prescriptions on File Prior to Visit  Medication Sig Dispense Refill  . albuterol (PROVENTIL HFA) 108 (90 BASE) MCG/ACT inhaler Inhale 2 puffs into the lungs every 6 (six) hours as needed. For wheezing and/or shortness of breath     . albuterol (PROVENTIL) (2.5 MG/3ML) 0.083% nebulizer solution Take 2.5 mg by nebulization every 6 (six) hours as needed for wheezing or shortness of breath.    . ALPRAZolam (XANAX) 1 MG tablet Take 1 tablet (1 mg total) by mouth 3 (three) times daily as needed for anxiety or sleep. 90 tablet 0  . aspirin 81 MG chewable tablet Chew 81 mg by mouth daily.    . carvedilol (COREG) 25 MG tablet Take 1 tablet (25 mg total) by mouth daily. 90 tablet 1  . cloNIDine (CATAPRES) 0.1 MG tablet Take 1 tablet (0.1 mg total) by mouth 3 (three) times daily. 90 tablet 3  . hyoscyamine  (LEVBID) 0.375 MG 12 hr tablet Take 1 tablet (0.375 mg total) by mouth as needed. For stomach cramps 60 tablet 3  . irbesartan-hydrochlorothiazide (AVALIDE) 300-12.5 MG per tablet Take 1 tablet by mouth daily. 30 tablet 5  . metoCLOPramide (REGLAN) 5 MG tablet Take 1 tablet (5 mg total) by mouth every 6 (six) hours. 120 tablet 1  . morphine (MSIR) 30 MG tablet Take 1 tablet (30 mg total) by mouth every 4 (four) hours as needed. FOR PAIN 120 tablet 0  . Multiple Vitamins-Minerals (CENTRUM SILVER PO) Take 1 capsule by mouth daily.    . nitroGLYCERIN (NITROSTAT) 0.4 MG SL tablet Place 0.4 mg under the tongue every 5 (five) minutes as needed for chest pain.    [name] Glycol-Propyl Glycol (SYSTANE) 0.4-0.3 % SOLN Apply 1 drop to eye 2 (two) times daily as needed.    . predniSONE (DELTASONE) 10 MG tablet 1 x 5 days, then 1 qod until gone 15 tablet 0  . promethazine (PHENERGAN) 25 MG tablet Take 1 tablet (25 mg total) by mouth every 6 (six) hours as needed. FOR NAUSEA 90 tablet 0  . tiotropium (SPIRIVA) 18 MCG inhalation capsule Place 1 capsule (18 mcg total) into inhaler  and inhale daily. 90 capsule 4   No current facility-administered medications on file prior to visit.

## 2014-09-14 NOTE — Telephone Encounter (Signed)
There may be a bronchitis component that we can address with augmentin 875, # 14, 1 twice daily

## 2014-10-04 ENCOUNTER — Telehealth: Payer: Self-pay | Admitting: Internal Medicine

## 2014-10-04 DIAGNOSIS — J440 Chronic obstructive pulmonary disease with acute lower respiratory infection: Secondary | ICD-10-CM

## 2014-10-04 NOTE — Telephone Encounter (Signed)
lmtcb for pt.  

## 2014-10-04 NOTE — Telephone Encounter (Signed)
Please see if he can come by for lab- sputum culture (routine, fungal, AFB smear and culture) for dx chronic bronchitis  Once sputum obtained, then start  cipro 500 mg, # 14, 1 twice daily

## 2014-10-04 NOTE — Telephone Encounter (Signed)
ATC pt. Line busy. WCB 

## 2014-10-04 NOTE — Telephone Encounter (Signed)
Called spoke with pt. He c/o prod coug (green-white phlem), wheezing, chest tx, SOB w/ exertion. No f/s/v/n/c. He has finished a round of augemntin from 09/14/14. He is taking mucinex and using nebs.  Last OV w/ CDY 07/21/13 Pending appt 10/09/14 Please advise thanks  Allergies  Allergen Reactions  . Ivp Dye [Iodinated Diagnostic Agents]     Omni - Paque Contrast   . Oxycodone Hcl Shortness Of Breath and Rash  . Atorvastatin     REACTION: myalgias  . Bystolic [Nebivolol Hcl]     Nightmares, flashbacks  . Codeine     rash  . Doxycycline     Rash, SOB  . Iohexol      Desc: hives,neck and torso erythemia   . Methocarbamol     rash  . Morphine And Related     Pt is on out patient morphine not allergic to morphine  . Pentazocine Lactate     rash  . Succinylcholine Chloride Other (See Comments)    unknown  . Synvisc [Hylan G-F 20]     rash  . Butorphanol Tartrate Rash  . Dilaudid [Hydromorphone Hcl] Nausea Only and Rash    Aggressive      Current Outpatient Prescriptions on File Prior to Visit  Medication Sig Dispense Refill  . albuterol (PROVENTIL HFA) 108 (90 BASE) MCG/ACT inhaler Inhale 2 puffs into the lungs every 6 (six) hours as needed. For wheezing and/or shortness of breath     . albuterol (PROVENTIL) (2.5 MG/3ML) 0.083% nebulizer solution Take 2.5 mg by nebulization every 6 (six) hours as needed for wheezing or shortness of breath.    . ALPRAZolam (XANAX) 1 MG tablet Take 1 tablet (1 mg total) by mouth 3 (three) times daily as needed for anxiety or sleep. 90 tablet 0  . amoxicillin-clavulanate (AUGMENTIN) 875-125 MG per tablet Take 1 tablet by mouth 2 (two) times daily. 14 tablet 0  . aspirin 81 MG chewable tablet Chew 81 mg by mouth daily.    . carvedilol (COREG) 25 MG tablet Take 1 tablet (25 mg total) by mouth daily. 90 tablet 1  . cloNIDine (CATAPRES) 0.1 MG tablet Take 1 tablet (0.1 mg total) by mouth 3 (three) times daily. 90 tablet 3  . hyoscyamine (LEVBID) 0.375  MG 12 hr tablet Take 1 tablet (0.375 mg total) by mouth as needed. For stomach cramps 60 tablet 3  . irbesartan-hydrochlorothiazide (AVALIDE) 300-12.5 MG per tablet Take 1 tablet by mouth daily. 30 tablet 5  . metoCLOPramide (REGLAN) 5 MG tablet Take 1 tablet (5 mg total) by mouth every 6 (six) hours. 120 tablet 1  . morphine (MSIR) 30 MG tablet Take 1 tablet (30 mg total) by mouth every 4 (four) hours as needed. FOR PAIN 120 tablet 0  . Multiple Vitamins-Minerals (CENTRUM SILVER PO) Take 1 capsule by mouth daily.    . nitroGLYCERIN (NITROSTAT) 0.4 MG SL tablet Place 0.4 mg under the tongue every 5 (five) minutes as needed for chest pain.    [name] Glycol-Propyl Glycol (SYSTANE) 0.4-0.3 % SOLN Apply 1 drop to eye 2 (two) times daily as needed.    . predniSONE (DELTASONE) 10 MG tablet 1 x 5 days, then 1 qod until gone 15 tablet 0  . promethazine (PHENERGAN) 25 MG tablet Take 1 tablet (25 mg total) by mouth every 6 (six) hours as needed. FOR NAUSEA 90 tablet 0  . tiotropium (SPIRIVA) 18 MCG inhalation capsule Place 1 capsule (18 mcg total) into inhaler and inhale  daily. 90 capsule 4   No current facility-administered medications on file prior to visit.

## 2014-10-05 ENCOUNTER — Other Ambulatory Visit: Payer: Medicare Other

## 2014-10-05 DIAGNOSIS — J440 Chronic obstructive pulmonary disease with acute lower respiratory infection: Secondary | ICD-10-CM

## 2014-10-05 MED ORDER — CIPROFLOXACIN HCL 500 MG PO TABS: 500.0000 mg | ORAL_TABLET | Freq: Every day | ORAL | Status: DC

## 2014-10-05 NOTE — Telephone Encounter (Signed)
Patient notified. Rx sent to pharmacy.  Patient will come by and give Sputum sample today before starting Cipro.  Nothing further needed.

## 2014-10-08 LAB — RESPIRATORY CULTURE OR RESPIRATORY AND SPUTUM CULTURE

## 2014-10-09 ENCOUNTER — Ambulatory Visit (INDEPENDENT_AMBULATORY_CARE_PROVIDER_SITE_OTHER): Payer: Medicare Other | Admitting: Internal Medicine

## 2014-10-09 ENCOUNTER — Encounter: Payer: Self-pay | Admitting: Internal Medicine

## 2014-10-09 VITALS — BP 128/82 | HR 70 | Ht 73.0 in | Wt 246.0 lb

## 2014-10-09 DIAGNOSIS — J440 Chronic obstructive pulmonary disease with acute lower respiratory infection: Secondary | ICD-10-CM | POA: Diagnosis not present

## 2014-10-09 DIAGNOSIS — J441 Chronic obstructive pulmonary disease with (acute) exacerbation: Secondary | ICD-10-CM

## 2014-10-09 DIAGNOSIS — J4541 Moderate persistent asthma with (acute) exacerbation: Secondary | ICD-10-CM

## 2014-10-09 MED ORDER — BUDESONIDE-FORMOTEROL FUMARATE 160-4.5 MCG/ACT IN AERO: 2.0000 | INHALATION_SPRAY | Freq: Two times a day (BID) | RESPIRATORY_TRACT | Status: DC

## 2014-10-09 MED ORDER — BUDESONIDE-FORMOTEROL FUMARATE 160-4.5 MCG/ACT IN AERO: INHALATION_SPRAY | RESPIRATORY_TRACT | Status: DC

## 2014-10-09 MED ORDER — CIPROFLOXACIN HCL 500 MG PO TABS: 500.0000 mg | ORAL_TABLET | Freq: Two times a day (BID) | ORAL | Status: DC

## 2014-10-09 MED ORDER — ALBUTEROL SULFATE HFA 108 (90 BASE) MCG/ACT IN AERS: 2.0000 | INHALATION_SPRAY | RESPIRATORY_TRACT | Status: DC | PRN

## 2014-10-09 MED ORDER — TIOTROPIUM BROMIDE MONOHYDRATE 18 MCG IN CAPS: 18.0000 ug | ORAL_CAPSULE | Freq: Every day | RESPIRATORY_TRACT | Status: DC

## 2014-10-09 NOTE — Assessment & Plan Note (Signed)
We are trying to avoid systemic steroids although he is wheezing a lot. Plan-add Symbicort 160 back with samples and prescription. Refill Spiriva and Proventil rescue inhaler with discussion.

## 2014-10-09 NOTE — Patient Instructions (Addendum)
Samples and script Symbicort 160   2 puffs then rinse mouth well, twice daily  Refill script for Cipro in case we decide to extend the course.  Scripts for Spiriva and proventil for the [place]  The sputum culture from May 6 is growing a GI bug " Enterobacter aerogenes" sensitive to Cipro.

## 2014-10-09 NOTE — Assessment & Plan Note (Signed)
Wheezy bronchitis which recently cultured positive for Enterobacter sensitive to Cipro. Sputum color is improving on Cipro but he was only taking 500 mg once daily which is the way the bottle got labeled. Enterobacter presumably related to contamination from his GI tract. He has had multiple abdominal surgeries but does not recognize reflux.  Plan-continue Cipro at 500 mg twice daily. Second prescription is written anticipate he may need a refill as discussed.
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
## 2014-11-02 ENCOUNTER — Telehealth: Payer: Self-pay | Admitting: Internal Medicine

## 2014-11-02 MED ORDER — SULFAMETHOXAZOLE-TRIMETHOPRIM 800-160 MG PO TABS: 1.0000 | ORAL_TABLET | Freq: Two times a day (BID) | ORAL | Status: DC

## 2014-11-02 NOTE — Telephone Encounter (Signed)
Spoke with pt. States that he is not feeling better since taking Cipro. Reports SOB, chest tightness, coughing with production of yellow mucus and wheezing. Denies fever. Wants to know CY's recommendations.  Allergies  Allergen Reactions  . Ivp Dye [Iodinated Diagnostic Agents]     Omni - Paque Contrast   . Oxycodone Hcl Shortness Of Breath and Rash  . Atorvastatin     REACTION: myalgias  . Bystolic [Nebivolol Hcl]     Nightmares, flashbacks  . Codeine     rash  . Doxycycline     Rash, SOB  . Iohexol      Desc: hives,neck and torso erythemia   . Methocarbamol     rash  . Morphine And Related     Pt is on out patient morphine not allergic to morphine  . Pentazocine Lactate     rash  . Succinylcholine Chloride Other (See Comments)    unknown  . Synvisc [Hylan G-F 20]     rash  . Butorphanol Tartrate Rash  . Dilaudid [Hydromorphone Hcl] Nausea Only and Rash    Aggressive    Current Outpatient Prescriptions on File Prior to Visit  Medication Sig Dispense Refill  . albuterol (PROVENTIL HFA) 108 (90 BASE) MCG/ACT inhaler Inhale 2 puffs into the lungs every 4 (four) hours as needed. For wheezing and/or shortness of breath 3 Inhaler 4  . albuterol (PROVENTIL) (2.5 MG/3ML) 0.083% nebulizer solution Take 2.5 mg by nebulization every 6 (six) hours as needed for wheezing or shortness of breath.    . ALPRAZolam (XANAX) 1 MG tablet Take 1 tablet (1 mg total) by mouth 3 (three) times daily as needed for anxiety or sleep. 90 tablet 0  . aspirin 81 MG chewable tablet Chew 81 mg by mouth daily.    . budesonide-formoterol (SYMBICORT) 160-4.5 MCG/ACT inhaler 2 puffs then rinse mouth well, twice daily 3 Inhaler 4  . carvedilol (COREG) 25 MG tablet Take 1 tablet (25 mg total) by mouth daily. 90 tablet 1  . ciprofloxacin (CIPRO) 500 MG tablet Take 1 tablet (500 mg total) by mouth 2 (two) times daily. 14 tablet 0  . cloNIDine (CATAPRES) 0.1 MG tablet Take 1 tablet (0.1 mg total) by mouth 3  (three) times daily. 90 tablet 3  . hyoscyamine (LEVBID) 0.375 MG 12 hr tablet Take 1 tablet (0.375 mg total) by mouth as needed. For stomach cramps 60 tablet 3  . irbesartan-hydrochlorothiazide (AVALIDE) 300-12.5 MG per tablet Take 1 tablet by mouth daily. 30 tablet 5  . metoCLOPramide (REGLAN) 5 MG tablet Take 1 tablet (5 mg total) by mouth every 6 (six) hours. (Patient not taking: Reported on 10/09/2014) 120 tablet 1  . morphine (MSIR) 30 MG tablet Take 1 tablet (30 mg total) by mouth every 4 (four) hours as needed. FOR PAIN 120 tablet 0  . Multiple Vitamins-Minerals (CENTRUM SILVER PO) Take 1 capsule by mouth daily.    . nitroGLYCERIN (NITROSTAT) 0.4 MG SL tablet Place 0.4 mg under the tongue every 5 (five) minutes as needed for chest pain.    [name] Glycol-Propyl Glycol (SYSTANE) 0.4-0.3 % SOLN Apply 1 drop to eye 2 (two) times daily as needed.    . promethazine (PHENERGAN) 25 MG tablet Take 1 tablet (25 mg total) by mouth every 6 (six) hours as needed. FOR NAUSEA 90 tablet 0  . tiotropium (SPIRIVA) 18 MCG inhalation capsule Place 1 capsule (18 mcg total) into inhaler and inhale daily. 90 capsule 4   No current facility-administered  medications on file prior to visit.    CY - please advise. Thanks.

## 2014-11-02 NOTE — Telephone Encounter (Signed)
Pt is aware of CY's recommendation. Rx has been sent in. Nothing further was needed. 

## 2014-11-02 NOTE — Telephone Encounter (Signed)
The bacteria cultured from his sputum should have been very sensitive to Cipro. If it hasn't helped, try             Septra-DS, # 20, 1 twice daily

## 2014-11-06 ENCOUNTER — Other Ambulatory Visit: Payer: Self-pay | Admitting: *Deleted

## 2014-11-06 DIAGNOSIS — I10 Essential (primary) hypertension: Secondary | ICD-10-CM

## 2014-11-06 DIAGNOSIS — R0600 Dyspnea, unspecified: Secondary | ICD-10-CM

## 2014-11-06 DIAGNOSIS — E785 Hyperlipidemia, unspecified: Secondary | ICD-10-CM

## 2014-11-06 DIAGNOSIS — R0609 Other forms of dyspnea: Secondary | ICD-10-CM

## 2014-11-06 DIAGNOSIS — I6523 Occlusion and stenosis of bilateral carotid arteries: Secondary | ICD-10-CM

## 2014-11-07 ENCOUNTER — Telehealth: Payer: Self-pay | Admitting: Internal Medicine

## 2014-11-07 DIAGNOSIS — J441 Chronic obstructive pulmonary disease with (acute) exacerbation: Secondary | ICD-10-CM

## 2014-11-07 NOTE — Telephone Encounter (Signed)
Patient notified.  Orders entered. Patient says he will come in to have Xray and labs done tomorrow. Nothing further needed.

## 2014-11-07 NOTE — Telephone Encounter (Signed)
Not sure why this is taking so long to clear. How many days more of bactrim antibiotic does he have? Suggest CXR  Dx chronic bronchitis with exacerbation                Lab for CBC w diff

## 2014-11-07 NOTE — Telephone Encounter (Signed)
Patient says that he feels about the same as he did the other day, no fever right now. Still has a lot of rattling in his chest, coughing up a lot of dark colored, thick, sticky sputum.  Congestion in head, tightness in chest.  Patient still taking prescriptions given at last OV (Bactrim) and taking Mucinex. Using nebulizer as well.  Using steam to try to keep passage way open.  Wheezing a lot and coughing a lot during phone conversation.   Allergies  Allergen Reactions  . Ivp Dye [Iodinated Diagnostic Agents]     Omni - Paque Contrast   . Oxycodone Hcl Shortness Of Breath and Rash  . Atorvastatin     REACTION: myalgias  . Bystolic [Nebivolol Hcl]     Nightmares, flashbacks  . Codeine     rash  . Doxycycline     Rash, SOB  . Iohexol      Desc: hives,neck and torso erythemia   . Methocarbamol     rash  . Morphine And Related     Pt is on out patient morphine not allergic to morphine  . Pentazocine Lactate     rash  . Succinylcholine Chloride Other (See Comments)    unknown  . Synvisc [Hylan G-F 20]     rash  . Butorphanol Tartrate Rash  . Dilaudid [Hydromorphone Hcl] Nausea Only and Rash    Aggressive    Current Outpatient Prescriptions on File Prior to Visit  Medication Sig Dispense Refill  . albuterol (PROVENTIL HFA) 108 (90 BASE) MCG/ACT inhaler Inhale 2 puffs into the lungs every 4 (four) hours as needed. For wheezing and/or shortness of breath 3 Inhaler 4  . albuterol (PROVENTIL) (2.5 MG/3ML) 0.083% nebulizer solution Take 2.5 mg by nebulization every 6 (six) hours as needed for wheezing or shortness of breath.    . ALPRAZolam (XANAX) 1 MG tablet Take 1 tablet (1 mg total) by mouth 3 (three) times daily as needed for anxiety or sleep. 90 tablet 0  . aspirin 81 MG chewable tablet Chew 81 mg by mouth daily.    . budesonide-formoterol (SYMBICORT) 160-4.5 MCG/ACT inhaler 2 puffs then rinse mouth well, twice daily 3 Inhaler 4  . carvedilol (COREG) 25 MG tablet Take 1 tablet (25  mg total) by mouth daily. 90 tablet 1  . ciprofloxacin (CIPRO) 500 MG tablet Take 1 tablet (500 mg total) by mouth 2 (two) times daily. 14 tablet 0  . cloNIDine (CATAPRES) 0.1 MG tablet Take 1 tablet (0.1 mg total) by mouth 3 (three) times daily. 90 tablet 3  . hyoscyamine (LEVBID) 0.375 MG 12 hr tablet Take 1 tablet (0.375 mg total) by mouth as needed. For stomach cramps 60 tablet 3  . irbesartan-hydrochlorothiazide (AVALIDE) 300-12.5 MG per tablet Take 1 tablet by mouth daily. 30 tablet 5  . metoCLOPramide (REGLAN) 5 MG tablet Take 1 tablet (5 mg total) by mouth every 6 (six) hours. (Patient not taking: Reported on 10/09/2014) 120 tablet 1  . morphine (MSIR) 30 MG tablet Take 1 tablet (30 mg total) by mouth every 4 (four) hours as needed. FOR PAIN 120 tablet 0  . Multiple Vitamins-Minerals (CENTRUM SILVER PO) Take 1 capsule by mouth daily.    . nitroGLYCERIN (NITROSTAT) 0.4 MG SL tablet Place 0.4 mg under the tongue every 5 (five) minutes as needed for chest pain.    [name] Glycol-Propyl Glycol (SYSTANE) 0.4-0.3 % SOLN Apply 1 drop to eye 2 (two) times daily as needed.    [name]  promethazine (PHENERGAN) 25 MG tablet Take 1 tablet (25 mg total) by mouth every 6 (six) hours as needed. FOR NAUSEA 90 tablet 0  . sulfamethoxazole-trimethoprim (BACTRIM DS,SEPTRA DS) 800-160 MG per tablet Take 1 tablet by mouth 2 (two) times daily. 20 tablet 0  . tiotropium (SPIRIVA) 18 MCG inhalation capsule Place 1 capsule (18 mcg total) into inhaler and inhale daily. 90 capsule 4   No current facility-administered medications on file prior to visit.

## 2014-11-15 ENCOUNTER — Other Ambulatory Visit (INDEPENDENT_AMBULATORY_CARE_PROVIDER_SITE_OTHER): Payer: Medicare Other

## 2014-11-15 ENCOUNTER — Ambulatory Visit (INDEPENDENT_AMBULATORY_CARE_PROVIDER_SITE_OTHER)
Admission: RE | Admit: 2014-11-15 | Discharge: 2014-11-15 | Disposition: A | Discharge status: Home / Self Care | Payer: Medicare Other | Source: Ambulatory Visit | Attending: Internal Medicine | Admitting: Internal Medicine

## 2014-11-15 DIAGNOSIS — J441 Chronic obstructive pulmonary disease with (acute) exacerbation: Secondary | ICD-10-CM

## 2014-11-15 LAB — CBC WITH DIFFERENTIAL/PLATELET
BASOS ABS: 0 10*3/uL (ref 0.0–0.1)
Basophils Relative: 0.6 % (ref 0.0–3.0)
EOS ABS: 0.4 10*3/uL (ref 0.0–0.7)
Eosinophils Relative: 5.2 % — ABNORMAL HIGH (ref 0.0–5.0)
HCT: 40.7 % (ref 39.0–52.0)
Hemoglobin: 13.7 g/dL (ref 13.0–17.0)
LYMPHS PCT: 30.9 % (ref 12.0–46.0)
Lymphs Abs: 2.2 10*3/uL (ref 0.7–4.0)
MCHC: 33.7 g/dL (ref 30.0–36.0)
MCV: 87.8 fl (ref 78.0–100.0)
MONO ABS: 0.5 10*3/uL (ref 0.1–1.0)
Monocytes Relative: 7.3 % (ref 3.0–12.0)
NEUTROS PCT: 56 % (ref 43.0–77.0)
Neutro Abs: 3.9 10*3/uL (ref 1.4–7.7)
PLATELETS: 328 10*3/uL (ref 150.0–400.0)
RBC: 4.64 Mil/uL (ref 4.22–5.81)
RDW: 13.7 % (ref 11.5–15.5)
WBC: 7 10*3/uL (ref 4.0–10.5)

## 2014-11-16 NOTE — Progress Notes (Signed)
Quick Note:  Called and spoke to pt, reviewed results and recs. Pt voiced understanding and had no further questions. ______

## 2014-11-16 NOTE — Progress Notes (Signed)
Quick Note:  Called and spoke with pt. Reviewed results and recs. Pt voiced understanding and had no futher questions. ______

## 2014-11-18 LAB — AFB CULTURE WITH SMEAR (NOT AT ARMC): Acid Fast Smear: NONE SEEN

## 2014-11-26 ENCOUNTER — Other Ambulatory Visit: Payer: Self-pay

## 2014-12-06 ENCOUNTER — Telehealth: Payer: Self-pay | Admitting: Internal Medicine

## 2014-12-06 MED ORDER — CIPROFLOXACIN HCL 750 MG PO TABS: 750.0000 mg | ORAL_TABLET | Freq: Two times a day (BID) | ORAL | Status: DC

## 2014-12-06 NOTE — Telephone Encounter (Signed)
Spoke with pt. Reports that he is still coughing and producing yellow mucus. SOB and chest tightness are also present. Denies wheezing or fever. Would like something to be called in.  Allergies  Allergen Reactions  . Ivp Dye [Iodinated Diagnostic Agents]     Omni - Paque Contrast   . Oxycodone Hcl Shortness Of Breath and Rash  . Atorvastatin     REACTION: myalgias  . Bystolic [Nebivolol Hcl]     Nightmares, flashbacks  . Codeine     rash  . Doxycycline     Rash, SOB  . Iohexol      Desc: hives,neck and torso erythemia   . Methocarbamol     rash  . Morphine And Related     Pt is on out patient morphine not allergic to morphine  . Pentazocine Lactate     rash  . Succinylcholine Chloride Other (See Comments)    unknown  . Synvisc [Hylan G-F 20]     rash  . Butorphanol Tartrate Rash  . Dilaudid [Hydromorphone Hcl] Nausea Only and Rash    Aggressive     Current Outpatient Prescriptions on File Prior to Visit  Medication Sig Dispense Refill  . albuterol (PROVENTIL HFA) 108 (90 BASE) MCG/ACT inhaler Inhale 2 puffs into the lungs every 4 (four) hours as needed. For wheezing and/or shortness of breath 3 Inhaler 4  . albuterol (PROVENTIL) (2.5 MG/3ML) 0.083% nebulizer solution Take 2.5 mg by nebulization every 6 (six) hours as needed for wheezing or shortness of breath.    . ALPRAZolam (XANAX) 1 MG tablet Take 1 tablet (1 mg total) by mouth 3 (three) times daily as needed for anxiety or sleep. 90 tablet 0  . aspirin 81 MG chewable tablet Chew 81 mg by mouth daily.    . budesonide-formoterol (SYMBICORT) 160-4.5 MCG/ACT inhaler 2 puffs then rinse mouth well, twice daily 3 Inhaler 4  . carvedilol (COREG) 25 MG tablet Take 1 tablet (25 mg total) by mouth daily. 90 tablet 1  . ciprofloxacin (CIPRO) 500 MG tablet Take 1 tablet (500 mg total) by mouth 2 (two) times daily. 14 tablet 0  . cloNIDine (CATAPRES) 0.1 MG tablet Take 1 tablet (0.1 mg total) by mouth 3 (three) times daily. 90  tablet 3  . hyoscyamine (LEVBID) 0.375 MG 12 hr tablet Take 1 tablet (0.375 mg total) by mouth as needed. For stomach cramps 60 tablet 3  . irbesartan-hydrochlorothiazide (AVALIDE) 300-12.5 MG per tablet Take 1 tablet by mouth daily. 30 tablet 5  . metoCLOPramide (REGLAN) 5 MG tablet Take 1 tablet (5 mg total) by mouth every 6 (six) hours. (Patient not taking: Reported on 10/09/2014) 120 tablet 1  . morphine (MSIR) 30 MG tablet Take 1 tablet (30 mg total) by mouth every 4 (four) hours as needed. FOR PAIN 120 tablet 0  . Multiple Vitamins-Minerals (CENTRUM SILVER PO) Take 1 capsule by mouth daily.    . nitroGLYCERIN (NITROSTAT) 0.4 MG SL tablet Place 0.4 mg under the tongue every 5 (five) minutes as needed for chest pain.    [name] Glycol-Propyl Glycol (SYSTANE) 0.4-0.3 % SOLN Apply 1 drop to eye 2 (two) times daily as needed.    . promethazine (PHENERGAN) 25 MG tablet Take 1 tablet (25 mg total) by mouth every 6 (six) hours as needed. FOR NAUSEA 90 tablet 0  . sulfamethoxazole-trimethoprim (BACTRIM DS,SEPTRA DS) 800-160 MG per tablet Take 1 tablet by mouth 2 (two) times daily. 20 tablet 0  . tiotropium (SPIRIVA) 18  MCG inhalation capsule Place 1 capsule (18 mcg total) into inhaler and inhale daily. 90 capsule 4   No current facility-administered medications on file prior to visit.    CY - please advise. Thanks.

## 2014-12-06 NOTE — Telephone Encounter (Signed)
Called spoke with pt. The cipro sent in may did not have a refill on it. This was the last time he had cipro.  Per 11/02/14 phone note: Deneise Lever, MD at 11/02/2014 1:03 PM     Status: Signed       Expand All Collapse All   The bacteria cultured from his sputum should have been very sensitive to Cipro. If it hasn't helped, try  Septra-DS, # 20, 1 twice daily      --  Please advise Dr. Annamaria Boots thanks

## 2014-12-06 NOTE — Telephone Encounter (Signed)
Ok lets try Cipro 750 mg, # 14, 1 twice daily Suggest he take a probiotic like Florastor or Align, or Activia yogurt, to reduce change of GI upset.

## 2014-12-06 NOTE — Telephone Encounter (Signed)
Pt aware of recs.  Med sent in.  Nothing further needed.

## 2014-12-06 NOTE — Telephone Encounter (Signed)
In May we gave him cipro with one refill. Did the first round help? Did he get it refilled?

## 2014-12-22 ENCOUNTER — Other Ambulatory Visit: Payer: Self-pay | Admitting: Cardiology

## 2014-12-24 ENCOUNTER — Telehealth: Payer: Self-pay | Admitting: Cardiology

## 2014-12-24 NOTE — Telephone Encounter (Signed)
°  1. Which medications need to be refilled? Carvedilol  2. Which pharmacy is medication to be sent to?[facility]  3. Do they need a 30 day or 90 day supply? 90 and refills  4. Would they like a call back once the medication has been sent to the pharmacy? yes

## 2014-12-24 NOTE — Telephone Encounter (Signed)
Carvedilol refilled #90 with 2 refills 12/24/14 LM that med was refilled

## 2014-12-24 NOTE — Telephone Encounter (Signed)
Rx(s) sent to pharmacy electronically.  

## 2015-01-01 ENCOUNTER — Telehealth (HOSPITAL_COMMUNITY): Payer: Self-pay | Admitting: *Deleted

## 2015-01-01 NOTE — Telephone Encounter (Signed)
Pt is c/o swelling and pain in both legs. Pt wants to know if something can be done. Pt has an echo and carotid scheduled in August.

## 2015-01-01 NOTE — Telephone Encounter (Signed)
Spoke with pt, he has been having problems for about one year now and the last 4 months he feels they have gotten worse. He has swelling in his feet and ankles and occ his hands. He has SOB that is unchanged from his normal. He is also having cramping in his feet and legs. He thinks he maybe getting some heart failure. He is scheduled for an echo later this month. Dr harding aware of pt issues and we will await test results to make any changes.

## 2015-01-10 ENCOUNTER — Ambulatory Visit (INDEPENDENT_AMBULATORY_CARE_PROVIDER_SITE_OTHER): Payer: Medicare Other | Admitting: Internal Medicine

## 2015-01-10 ENCOUNTER — Encounter: Payer: Self-pay | Admitting: Internal Medicine

## 2015-01-10 VITALS — BP 132/80 | HR 73 | Ht 73.0 in | Wt 243.0 lb

## 2015-01-10 DIAGNOSIS — J441 Chronic obstructive pulmonary disease with (acute) exacerbation: Secondary | ICD-10-CM

## 2015-01-10 DIAGNOSIS — R05 Cough: Secondary | ICD-10-CM | POA: Diagnosis not present

## 2015-01-10 DIAGNOSIS — R059 Cough, unspecified: Secondary | ICD-10-CM

## 2015-01-10 MED ORDER — FLUTTER DEVI: Status: DC

## 2015-01-10 MED ORDER — ALBUTEROL SULFATE (2.5 MG/3ML) 0.083% IN NEBU: 2.5000 mg | INHALATION_SOLUTION | Freq: Four times a day (QID) | RESPIRATORY_TRACT | Status: DC | PRN

## 2015-01-10 MED ORDER — CIPROFLOXACIN HCL 750 MG PO TABS: 750.0000 mg | ORAL_TABLET | Freq: Two times a day (BID) | ORAL | Status: DC

## 2015-01-10 NOTE — Patient Instructions (Signed)
Order- Script printed for Flutter device    Blow through 4 times per set, 3 sets per day while needed  Script sent for Cipro
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
## 2015-01-11 NOTE — Assessment & Plan Note (Signed)
Recurrent asthmatic bronchitis exacerbation. He was cultured Enterobacter and silent reflux with aspiration is highly likely as discussed. He sleeps with head of bed elevated. Plan-Cipro 750 mg twice daily, refill nebulizer solution, Flutter device

## 2015-01-14 ENCOUNTER — Ambulatory Visit (HOSPITAL_COMMUNITY): Payer: Medicare Other

## 2015-01-16 ENCOUNTER — Inpatient Hospital Stay (HOSPITAL_COMMUNITY): Admission: RE | Admit: 2015-01-16 | Payer: Medicare Other | Source: Ambulatory Visit

## 2015-03-27 ENCOUNTER — Telehealth: Payer: Self-pay | Admitting: Internal Medicine

## 2015-03-27 MED ORDER — LEVOFLOXACIN 750 MG PO TABS: 750.0000 mg | ORAL_TABLET | Freq: Every day | ORAL | Status: DC

## 2015-03-27 NOTE — Telephone Encounter (Signed)
Offer levaquin 750 mg, # 7, 1 daily

## 2015-03-27 NOTE — Telephone Encounter (Signed)
Pt is aware of CY's recommendation. Rx has been sent in. Nothing further was needed. 

## 2015-03-27 NOTE — Telephone Encounter (Signed)
Spoke with the pt  He is c/o increased SOB, wheezing, chest tightness, low grade temp (99.5-101) He is also coughing a lot- prod with moderate dark yellow sputum  He is using albuterol inhaler and neb with albuterol both at least 2- x per day  Onset of these symptoms was approx 10 days ago, and he had cipro on hand and finished round of this 2 days ago with minimal to no relief  I offered to add him on to see MW this pm, but he refused  No other openings in any providers schedule today  CDY, please advise, thanks! Last ov 8/11/6 Next ov 05/13/15   Allergies  Allergen Reactions  . Ivp Dye [Iodinated Diagnostic Agents]     Omni - Paque Contrast   . Oxycodone Hcl Shortness Of Breath and Rash  . Atorvastatin     REACTION: myalgias  . Bystolic [Nebivolol Hcl]     Nightmares, flashbacks  . Codeine     rash  . Doxycycline     Rash, SOB  . Iohexol      Desc: hives,neck and torso erythemia   . Methocarbamol     rash  . Morphine And Related     Pt is on out patient morphine not allergic to morphine  . Pentazocine Lactate     rash  . Succinylcholine Chloride Other (See Comments)    unknown  . Synvisc [Hylan G-F 20]     rash  . Butorphanol Tartrate Rash  . Dilaudid [Hydromorphone Hcl] Nausea Only and Rash    Aggressive    Current Outpatient Prescriptions on File Prior to Visit  Medication Sig Dispense Refill  . albuterol (PROVENTIL HFA) 108 (90 BASE) MCG/ACT inhaler Inhale 2 puffs into the lungs every 4 (four) hours as needed. For wheezing and/or shortness of breath 3 Inhaler 4  . albuterol (PROVENTIL) (2.5 MG/3ML) 0.083% nebulizer solution Take 3 mLs (2.5 mg total) by nebulization every 6 (six) hours as needed for wheezing or shortness of breath. 75 mL 11  . ALPRAZolam (XANAX) 1 MG tablet Take 1 tablet (1 mg total) by mouth 3 (three) times daily as needed for anxiety or sleep. 90 tablet 0  . aspirin 81 MG chewable tablet Chew 81 mg by mouth daily.    . budesonide-formoterol  (SYMBICORT) 160-4.5 MCG/ACT inhaler 2 puffs then rinse mouth well, twice daily 3 Inhaler 4  . carvedilol (COREG) 25 MG tablet TAKE 1 TABLET (25 MG TOTAL) BY MOUTH DAILY. 90 tablet 2  . ciprofloxacin (CIPRO) 750 MG tablet Take 1 tablet (750 mg total) by mouth 2 (two) times daily. 14 tablet 2  . cloNIDine (CATAPRES) 0.1 MG tablet Take 1 tablet (0.1 mg total) by mouth 3 (three) times daily. 90 tablet 3  . hyoscyamine (LEVBID) 0.375 MG 12 hr tablet Take 1 tablet (0.375 mg total) by mouth as needed. For stomach cramps 60 tablet 3  . irbesartan-hydrochlorothiazide (AVALIDE) 300-12.5 MG per tablet Take 1 tablet by mouth daily. 30 tablet 5  . morphine (MSIR) 30 MG tablet Take 1 tablet (30 mg total) by mouth every 4 (four) hours as needed. FOR PAIN 120 tablet 0  . Multiple Vitamins-Minerals (CENTRUM SILVER PO) Take 1 capsule by mouth daily.    . nitroGLYCERIN (NITROSTAT) 0.4 MG SL tablet Place 0.4 mg under the tongue every 5 (five) minutes as needed for chest pain.    [name] Glycol-Propyl Glycol (SYSTANE) 0.4-0.3 % SOLN Apply 1 drop to eye 2 (two) times daily as needed.    [name]  promethazine (PHENERGAN) 25 MG tablet Take 1 tablet (25 mg total) by mouth every 6 (six) hours as needed. FOR NAUSEA 90 tablet 0  . Respiratory Therapy Supplies (FLUTTER) DEVI Use as directed 1 each 0  . tiotropium (SPIRIVA) 18 MCG inhalation capsule Place 1 capsule (18 mcg total) into inhaler and inhale daily. 90 capsule 4   No current facility-administered medications on file prior to visit.

## 2015-04-04 ENCOUNTER — Ambulatory Visit: Payer: Medicare Other | Admitting: Family Medicine

## 2015-04-08 ENCOUNTER — Telehealth: Payer: Self-pay | Admitting: Internal Medicine

## 2015-04-08 DIAGNOSIS — J4541 Moderate persistent asthma with (acute) exacerbation: Secondary | ICD-10-CM

## 2015-04-08 NOTE — Telephone Encounter (Signed)
Stop levaquin, chart this as allergy  How is his chest infection doing?

## 2015-04-08 NOTE — Telephone Encounter (Signed)
Ok top use benadryl as needed  Can use an otc cortisone ointment on blisters if needed  Because of his persistent bronchitis with bacteria from GI tract complicated by multiple antibiotic intolerances:             Please order referral to Infectious Disease at [facility]

## 2015-04-08 NOTE — Telephone Encounter (Signed)
Spoke with pt and is aware of recs. Referral placed for ID. Nothing further needed

## 2015-04-08 NOTE — Telephone Encounter (Signed)
Spoke with pt. levaquin is added to allergy list. Pt is still having prod cough (clear-light yellow phlem), wheezing/chest tx on exertion, SOB on exertion. He does still have the blisters on his forearms from the levaquin. Doing breathing TX's. Please advise Dr. Annamaria Boots thanks

## 2015-04-08 NOTE — Telephone Encounter (Signed)
Called spoke with pt. He reports he took his 5th dose of levaquin yesterday morning. Then in the afternoon he started having itching and welps, pain in his joints. Then overnight he developed water blisters on forearms. He was told by pharmacy to call us. He is taking benadryl 25mg  2 caps every 4 hrs. Please advise Dr. Annamaria Boots thanks  Allergies  Allergen Reactions  . Ivp Dye [Iodinated Diagnostic Agents]     Omni - Paque Contrast   . Oxycodone Hcl Shortness Of Breath and Rash  . Atorvastatin     REACTION: myalgias  . Bystolic [Nebivolol Hcl]     Nightmares, flashbacks  . Codeine     rash  . Doxycycline     Rash, SOB  . Iohexol      Desc: hives,neck and torso erythemia   . Methocarbamol     rash  . Morphine And Related     Pt is on out patient morphine not allergic to morphine  . Pentazocine Lactate     rash  . Succinylcholine Chloride Other (See Comments)    unknown  . Synvisc [Hylan G-F 20]     rash  . Butorphanol Tartrate Rash  . Dilaudid [Hydromorphone Hcl] Nausea Only and Rash    Aggressive      Current Outpatient Prescriptions on File Prior to Visit  Medication Sig Dispense Refill  . albuterol (PROVENTIL HFA) 108 (90 BASE) MCG/ACT inhaler Inhale 2 puffs into the lungs every 4 (four) hours as needed. For wheezing and/or shortness of breath 3 Inhaler 4  . albuterol (PROVENTIL) (2.5 MG/3ML) 0.083% nebulizer solution Take 3 mLs (2.5 mg total) by nebulization every 6 (six) hours as needed for wheezing or shortness of breath. 75 mL 11  . ALPRAZolam (XANAX) 1 MG tablet Take 1 tablet (1 mg total) by mouth 3 (three) times daily as needed for anxiety or sleep. 90 tablet 0  . aspirin 81 MG chewable tablet Chew 81 mg by mouth daily.    . budesonide-formoterol (SYMBICORT) 160-4.5 MCG/ACT inhaler 2 puffs then rinse mouth well, twice daily 3 Inhaler 4  . carvedilol (COREG) 25 MG tablet TAKE 1 TABLET (25 MG TOTAL) BY MOUTH DAILY. 90 tablet 2  . cloNIDine (CATAPRES) 0.1 MG tablet Take 1  tablet (0.1 mg total) by mouth 3 (three) times daily. 90 tablet 3  . hyoscyamine (LEVBID) 0.375 MG 12 hr tablet Take 1 tablet (0.375 mg total) by mouth as needed. For stomach cramps 60 tablet 3  . irbesartan-hydrochlorothiazide (AVALIDE) 300-12.5 MG per tablet Take 1 tablet by mouth daily. 30 tablet 5  . levofloxacin (LEVAQUIN) 750 MG tablet Take 1 tablet (750 mg total) by mouth daily. 7 tablet 0  . morphine (MSIR) 30 MG tablet Take 1 tablet (30 mg total) by mouth every 4 (four) hours as needed. FOR PAIN 120 tablet 0  . Multiple Vitamins-Minerals (CENTRUM SILVER PO) Take 1 capsule by mouth daily.    . nitroGLYCERIN (NITROSTAT) 0.4 MG SL tablet Place 0.4 mg under the tongue every 5 (five) minutes as needed for chest pain.    [name] Glycol-Propyl Glycol (SYSTANE) 0.4-0.3 % SOLN Apply 1 drop to eye 2 (two) times daily as needed.    . promethazine (PHENERGAN) 25 MG tablet Take 1 tablet (25 mg total) by mouth every 6 (six) hours as needed. FOR NAUSEA 90 tablet 0  . Respiratory Therapy Supplies (FLUTTER) DEVI Use as directed 1 each 0  . tiotropium (SPIRIVA) 18 MCG inhalation capsule Place 1 capsule (  18 mcg total) into inhaler and inhale daily. 90 capsule 4   No current facility-administered medications on file prior to visit.

## 2015-04-27 ENCOUNTER — Other Ambulatory Visit: Payer: Self-pay | Admitting: Cardiology

## 2015-05-01 ENCOUNTER — Ambulatory Visit: Payer: Medicare Other | Admitting: Infectious Diseases

## 2015-05-13 ENCOUNTER — Encounter: Payer: Self-pay | Admitting: Internal Medicine

## 2015-05-13 ENCOUNTER — Ambulatory Visit (INDEPENDENT_AMBULATORY_CARE_PROVIDER_SITE_OTHER): Payer: Medicare Other | Admitting: Internal Medicine

## 2015-05-13 VITALS — BP 126/72 | HR 68 | Temp 97.9°F | Wt 247.2 lb

## 2015-05-13 DIAGNOSIS — J441 Chronic obstructive pulmonary disease with (acute) exacerbation: Secondary | ICD-10-CM | POA: Diagnosis not present

## 2015-05-13 DIAGNOSIS — K5669 Other intestinal obstruction: Secondary | ICD-10-CM

## 2015-05-13 DIAGNOSIS — K56609 Unspecified intestinal obstruction, unspecified as to partial versus complete obstruction: Secondary | ICD-10-CM

## 2015-05-13 NOTE — Assessment & Plan Note (Signed)
COPD mixed type. Strongly suspect a component of recurrent reflux and aspiration because he has cultured Enterobacter from sputum and has history of partial bowel obstruction.
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
## 2015-05-13 NOTE — Patient Instructions (Signed)
Keep appointment with Dr Johnnye Sima as planned  Please call if we can help

## 2015-05-13 NOTE — Assessment & Plan Note (Signed)
History of partial bowel obstruction. He has a self-care regimen that helps.

## 2015-05-23 ENCOUNTER — Ambulatory Visit: Payer: Medicare Other | Admitting: Infectious Diseases

## 2015-06-07 ENCOUNTER — Telehealth: Payer: Self-pay | Admitting: Internal Medicine

## 2015-06-07 MED ORDER — CEFDINIR 300 MG PO CAPS: 300.0000 mg | ORAL_CAPSULE | Freq: Two times a day (BID) | ORAL | Status: DC

## 2015-06-07 NOTE — Telephone Encounter (Signed)
Called spoke with pt. He c/o prod cough (brown phlem), wheezing, chest tx, nasal cong, PND, increase SOB w/ exertion x last night. He wants something called in. Please advise Dr. Annamaria Boots thanks  Allergies  Allergen Reactions  . Ivp Dye [Iodinated Diagnostic Agents]     Omni - Paque Contrast   . Oxycodone Hcl Shortness Of Breath and Rash  . Atorvastatin     REACTION: myalgias  . Bystolic [Nebivolol Hcl]     Nightmares, flashbacks  . Codeine     rash  . Doxycycline     Rash, SOB  . Iohexol      Desc: hives,neck and torso erythemia   . Levaquin [Levofloxacin]     Itching, welps  . Methocarbamol     rash  . Morphine And Related     Pt is on out patient morphine not allergic to morphine  . Pentazocine Lactate     rash  . Succinylcholine Chloride Other (See Comments)    unknown  . Synvisc [Hylan G-F 20]     rash  . Butorphanol Tartrate Rash  . Dilaudid [Hydromorphone Hcl] Nausea Only and Rash    Aggressive      Current Outpatient Prescriptions on File Prior to Visit  Medication Sig Dispense Refill  . albuterol (PROVENTIL HFA) 108 (90 BASE) MCG/ACT inhaler Inhale 2 puffs into the lungs every 4 (four) hours as needed. For wheezing and/or shortness of breath 3 Inhaler 4  . albuterol (PROVENTIL) (2.5 MG/3ML) 0.083% nebulizer solution Take 3 mLs (2.5 mg total) by nebulization every 6 (six) hours as needed for wheezing or shortness of breath. 75 mL 11  . ALPRAZolam (XANAX) 1 MG tablet Take 1 tablet (1 mg total) by mouth 3 (three) times daily as needed for anxiety or sleep. 90 tablet 0  . aspirin 81 MG chewable tablet Chew 81 mg by mouth daily.    . budesonide-formoterol (SYMBICORT) 160-4.5 MCG/ACT inhaler 2 puffs then rinse mouth well, twice daily 3 Inhaler 4  . carvedilol (COREG) 25 MG tablet TAKE 1 TABLET (25 MG TOTAL) BY MOUTH DAILY. 90 tablet 2  . cloNIDine (CATAPRES) 0.1 MG tablet Take 1 tablet (0.1 mg total) by mouth 3 (three) times daily. 90 tablet 3  . hyoscyamine (LEVBID)  0.375 MG 12 hr tablet Take 1 tablet (0.375 mg total) by mouth as needed. For stomach cramps 60 tablet 3  . irbesartan-hydrochlorothiazide (AVALIDE) 300-12.5 MG tablet TAKE 1 TABLET BY MOUTH DAILY. 30 tablet 5  . morphine (MSIR) 30 MG tablet Take 1 tablet (30 mg total) by mouth every 4 (four) hours as needed. FOR PAIN 120 tablet 0  . Multiple Vitamins-Minerals (CENTRUM SILVER PO) Take 1 capsule by mouth daily.    . nitroGLYCERIN (NITROSTAT) 0.4 MG SL tablet Place 0.4 mg under the tongue every 5 (five) minutes as needed for chest pain.    [name] Glycol-Propyl Glycol (SYSTANE) 0.4-0.3 % SOLN Apply 1 drop to eye 2 (two) times daily as needed.    . promethazine (PHENERGAN) 25 MG tablet Take 1 tablet (25 mg total) by mouth every 6 (six) hours as needed. FOR NAUSEA 90 tablet 0  . Respiratory Therapy Supplies (FLUTTER) DEVI Use as directed 1 each 0  . tiotropium (SPIRIVA) 18 MCG inhalation capsule Place 1 capsule (18 mcg total) into inhaler and inhale daily. 90 capsule 4   No current facility-administered medications on file prior to visit.

## 2015-06-07 NOTE — Telephone Encounter (Signed)
Offer cefdinir 300 mg, # 20, 1 twice daily 

## 2015-06-07 NOTE — Telephone Encounter (Signed)
Called spoke with pt. Aware of recs below. RX sent into [facility]. Nothing further needed

## 2015-06-17 ENCOUNTER — Ambulatory Visit: Payer: Medicare Other | Admitting: Internal Medicine

## 2015-06-20 ENCOUNTER — Ambulatory Visit
Admission: RE | Admit: 2015-06-20 | Discharge: 2015-06-20 | Disposition: A | Discharge status: Home / Self Care | Payer: Medicare Other | Source: Ambulatory Visit | Attending: Internal Medicine | Admitting: Internal Medicine

## 2015-06-20 ENCOUNTER — Ambulatory Visit (INDEPENDENT_AMBULATORY_CARE_PROVIDER_SITE_OTHER): Payer: Medicare Other | Admitting: Internal Medicine

## 2015-06-20 ENCOUNTER — Encounter: Payer: Self-pay | Admitting: Internal Medicine

## 2015-06-20 DIAGNOSIS — J4521 Mild intermittent asthma with (acute) exacerbation: Secondary | ICD-10-CM | POA: Diagnosis not present
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
## 2015-06-24 ENCOUNTER — Telehealth: Payer: Self-pay | Admitting: *Deleted

## 2015-06-24 NOTE — Telephone Encounter (Signed)
Call from [facility] lab regarding the sputum culture. Unable to culture because of large amounts of epithelial cells (mostly saliva). [name]

## 2015-06-24 NOTE — Telephone Encounter (Signed)
Left message for patient to call the clinic.

## 2015-06-24 NOTE — Telephone Encounter (Signed)
Please call him and see if he is feeling better. If he is not please ask him to come in and see if he can give [place] a better sputum sample.

## 2015-06-25 LAB — RESPIRATORY CULTURE OR RESPIRATORY AND SPUTUM CULTURE

## 2015-06-25 NOTE — Telephone Encounter (Signed)
Patient still experiencing symptoms, will come by the clinic to pick up a sample cup.  He will bring back a sputum sample before Friday. Landis Gandy, RN

## 2015-07-09 ENCOUNTER — Telehealth: Payer: Self-pay | Admitting: *Deleted

## 2015-07-09 NOTE — Telephone Encounter (Signed)
Exacerbation of productive cough per pt.  Pt was told at his last OV, 06/20/15, that if his symptoms worsen to bring in a sputum sample.  He has a sterile container and can bring in a sample tomorrow.  Scheduled with Dr. Megan Salon for 07/25/15 @ 1045.

## 2015-07-10 ENCOUNTER — Other Ambulatory Visit: Payer: Medicare Other

## 2015-07-11 ENCOUNTER — Other Ambulatory Visit: Payer: Medicare Other

## 2015-07-11 DIAGNOSIS — J45901 Unspecified asthma with (acute) exacerbation: Secondary | ICD-10-CM

## 2015-07-15 ENCOUNTER — Telehealth: Payer: Self-pay | Admitting: *Deleted

## 2015-07-15 NOTE — Telephone Encounter (Signed)
[facility] shared that the sample was more "spit than sputum."  Not suitable for culture.  Next office visit 07/25/15 with Dr. Megan Salon.

## 2015-07-16 LAB — RESPIRATORY CULTURE OR RESPIRATORY AND SPUTUM CULTURE

## 2015-07-18 ENCOUNTER — Telehealth: Payer: Self-pay | Admitting: *Deleted

## 2015-07-18 NOTE — Telephone Encounter (Signed)
Patient called c/o increased coughing and congestion, thinks it might be a URI or a number of other possibilities. He has an upcoming appt with Dr. Megan Salon on 07/25/15 and his new PCP on 07/23/15. He states he will call his pulmonologist if it worsens. Advised to drink plenty of fluids, especially water and he stated he is already doing that. Sputum sample appears to have not finalized as of yet. He also shared that his grandson was killed in a bike accident in [place].

## 2015-07-23 ENCOUNTER — Encounter: Payer: Self-pay | Admitting: Family Medicine

## 2015-07-23 ENCOUNTER — Ambulatory Visit (INDEPENDENT_AMBULATORY_CARE_PROVIDER_SITE_OTHER): Payer: Medicare Other | Admitting: Family Medicine

## 2015-07-23 ENCOUNTER — Ambulatory Visit: Payer: Medicare Other | Admitting: Family Medicine

## 2015-07-23 VITALS — BP 160/84 | HR 66 | Temp 98.2°F | Ht 72.5 in | Wt 242.0 lb

## 2015-07-23 DIAGNOSIS — G8929 Other chronic pain: Secondary | ICD-10-CM

## 2015-07-23 DIAGNOSIS — R600 Localized edema: Secondary | ICD-10-CM

## 2015-07-23 DIAGNOSIS — K5900 Constipation, unspecified: Secondary | ICD-10-CM

## 2015-07-23 DIAGNOSIS — J309 Allergic rhinitis, unspecified: Secondary | ICD-10-CM

## 2015-07-23 DIAGNOSIS — I10 Essential (primary) hypertension: Secondary | ICD-10-CM | POA: Diagnosis not present

## 2015-07-23 DIAGNOSIS — G9009 Other idiopathic peripheral autonomic neuropathy: Secondary | ICD-10-CM

## 2015-07-23 DIAGNOSIS — M549 Dorsalgia, unspecified: Secondary | ICD-10-CM

## 2015-07-23 DIAGNOSIS — E118 Type 2 diabetes mellitus with unspecified complications: Secondary | ICD-10-CM

## 2015-07-23 DIAGNOSIS — F4310 Post-traumatic stress disorder, unspecified: Secondary | ICD-10-CM

## 2015-07-23 DIAGNOSIS — E785 Hyperlipidemia, unspecified: Secondary | ICD-10-CM | POA: Diagnosis not present

## 2015-07-23 DIAGNOSIS — I6523 Occlusion and stenosis of bilateral carotid arteries: Secondary | ICD-10-CM

## 2015-07-23 DIAGNOSIS — J45909 Unspecified asthma, uncomplicated: Secondary | ICD-10-CM

## 2015-07-23 DIAGNOSIS — K5909 Other constipation: Secondary | ICD-10-CM

## 2015-07-23 DIAGNOSIS — Z8673 Personal history of transient ischemic attack (TIA), and cerebral infarction without residual deficits: Secondary | ICD-10-CM

## 2015-07-23 NOTE — Patient Instructions (Addendum)
Talk to Dr Hardin Negus about gabapentin. Return for medicare wellness visit over next 2-3 months labwork today. We will request records from Dr Ulanda Edison at [facility].

## 2015-07-23 NOTE — Progress Notes (Signed)
Pre visit review using our clinic review tool, if applicable. No additional management support is needed unless otherwise documented below in the visit note. 
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
## 2015-07-24 ENCOUNTER — Encounter: Payer: Self-pay | Admitting: Family Medicine

## 2015-07-24 ENCOUNTER — Telehealth: Payer: Self-pay | Admitting: Family Medicine

## 2015-07-24 DIAGNOSIS — F4310 Post-traumatic stress disorder, unspecified: Secondary | ICD-10-CM

## 2015-07-24 HISTORY — DX: Post-traumatic stress disorder, unspecified: F43.10

## 2015-07-24 LAB — LIPID PANEL
CHOL/HDL RATIO: 5
Cholesterol: 207 mg/dL — ABNORMAL HIGH (ref 0–200)
HDL: 37.7 mg/dL — AB (ref >39.00)
LDL Cholesterol: 133 mg/dL — ABNORMAL HIGH (ref 0–99)
NONHDL: 169.12
Triglycerides: 181 mg/dL — ABNORMAL HIGH (ref 0.0–149.0)
VLDL: 36.2 mg/dL (ref 0.0–40.0)

## 2015-07-24 LAB — BASIC METABOLIC PANEL
BUN: 11 mg/dL (ref 6–23)
CALCIUM: 9.5 mg/dL (ref 8.4–10.5)
CO2: 28 mEq/L (ref 19–32)
CREATININE: 0.77 mg/dL (ref 0.40–1.50)
Chloride: 104 mEq/L (ref 96–112)
GFR: 107.76 mL/min (ref >60.00)
Glucose, Bld: 105 mg/dL — ABNORMAL HIGH (ref 70–99)
Potassium: 3.6 mEq/L (ref 3.5–5.1)
Sodium: 140 mEq/L (ref 135–145)

## 2015-07-24 LAB — HEMOGLOBIN A1C: HEMOGLOBIN A1C: 5.4 % (ref 4.6–6.5)

## 2015-07-24 NOTE — Assessment & Plan Note (Signed)
Overdue for f/u

## 2015-07-24 NOTE — Assessment & Plan Note (Signed)
No evidence of infection today. Discussed compression stockings and elevation of legs. rec possible gabapentin trial.

## 2015-07-24 NOTE — Assessment & Plan Note (Signed)
Continue aspirin daily.  

## 2015-07-24 NOTE — Assessment & Plan Note (Signed)
bp elevated today despite reported compliance with avalide 300/12.gm QD and carvedilol 25mg  bid.  No changes made today - reassess next visit.

## 2015-07-24 NOTE — Assessment & Plan Note (Addendum)
Followed by Dr Hardin Negus pain clinic - on [facility].  Failed lyrica. Suggested he discuss gabapentin with pain management.

## 2015-07-24 NOTE — Assessment & Plan Note (Signed)
Check FLP today. H/o atorvastatin intolerance. Not currently on cholesterol medication.

## 2015-07-24 NOTE — Assessment & Plan Note (Signed)
Check A1c today. Anticipate good control given endorsed fasting cbg's.. Off antihyperglycemic meds.

## 2015-07-24 NOTE — Assessment & Plan Note (Signed)
Reports compliance with amitiza 51mcg bid.

## 2015-07-24 NOTE — Assessment & Plan Note (Signed)
Continue f/u with Dr Hardin Negus

## 2015-07-24 NOTE — Assessment & Plan Note (Signed)
Endorses h/o this. Not on medication other than xanax prescribed by pain clinic. Further eval next visit.

## 2015-07-24 NOTE — Telephone Encounter (Signed)
Patient called and asked to be called back when his lab results come in.

## 2015-07-24 NOTE — Assessment & Plan Note (Signed)
Currently undergoing evaluation by pulm/ID. Pending AFB culture. Continue f/u with them.  States he takes advair and albuterol PRN

## 2015-07-25 ENCOUNTER — Ambulatory Visit: Payer: Medicare Other | Admitting: Internal Medicine

## 2015-07-26 NOTE — Telephone Encounter (Addendum)
plz notify pt lab results submitted via mychart.

## 2015-07-27 ENCOUNTER — Encounter: Payer: Self-pay | Admitting: Family Medicine

## 2015-08-14 ENCOUNTER — Other Ambulatory Visit: Payer: Self-pay

## 2015-08-14 ENCOUNTER — Telehealth: Payer: Self-pay | Admitting: Cardiology

## 2015-08-14 MED ORDER — CARVEDILOL 25 MG PO TABS: ORAL_TABLET | ORAL | Status: DC

## 2015-08-14 NOTE — Telephone Encounter (Signed)
Patient wife called in to request a refill for Carvedilol. Pt is past due for follow up. Pt wife sts that the pt will call the [facility] office today to schedule his appt with Dr.Harding. Pt last o/v sts that pt is taking Carvedilol 25mg  po qd. They are requesting a refill for bid. Please verify how this pt should be taking his cravedilol

## 2015-08-14 NOTE — Telephone Encounter (Signed)
Spoke to patient Patient states primary would like cardiology to fill carvedilol  Also made medication for cholesterol level. CARVEDILOL refilled #90 day supply no refill appointment schedule for 09/12/15 1:45 pm with Dr Ellyn Hack. Patient aware.

## 2015-08-14 NOTE — Telephone Encounter (Signed)
Patient received last refill for carvedilol from primary physician in 2/21 /2017. Please contact wife have primary continue to fill medication , until patientt is establish again with an appointment

## 2015-09-02 LAB — AFB CULTURE WITH SMEAR (NOT AT ARMC)
Acid Fast Smear: NONE SEEN
Acid Fast Smear: NONE SEEN

## 2015-09-12 ENCOUNTER — Ambulatory Visit: Payer: Medicare Other | Admitting: Cardiology

## 2015-09-19 ENCOUNTER — Ambulatory Visit (INDEPENDENT_AMBULATORY_CARE_PROVIDER_SITE_OTHER): Payer: Medicare Other | Admitting: Internal Medicine

## 2015-09-19 ENCOUNTER — Encounter: Payer: Self-pay | Admitting: Internal Medicine

## 2015-09-19 VITALS — BP 118/72 | HR 66 | Temp 98.2°F | Ht 73.0 in | Wt 243.6 lb

## 2015-09-19 DIAGNOSIS — F4310 Post-traumatic stress disorder, unspecified: Secondary | ICD-10-CM

## 2015-09-19 DIAGNOSIS — J45901 Unspecified asthma with (acute) exacerbation: Secondary | ICD-10-CM | POA: Diagnosis not present

## 2015-09-19 DIAGNOSIS — J441 Chronic obstructive pulmonary disease with (acute) exacerbation: Secondary | ICD-10-CM

## 2015-09-19 DIAGNOSIS — K219 Gastro-esophageal reflux disease without esophagitis: Secondary | ICD-10-CM

## 2015-09-19 MED ORDER — LEVALBUTEROL HCL 0.63 MG/3ML IN NEBU
0.6300 mg | INHALATION_SOLUTION | Freq: Once | RESPIRATORY_TRACT | Status: AC
  Administered 2015-09-19: 0.63 mg via RESPIRATORY_TRACT

## 2015-09-19 MED ORDER — METHYLPREDNISOLONE ACETATE 80 MG/ML IJ SUSP
80.0000 mg | Freq: Once | INTRAMUSCULAR | Status: AC
  Administered 2015-09-19: 80 mg via INTRAMUSCULAR

## 2015-09-19 MED ORDER — PREDNISONE 10 MG PO TABS: ORAL_TABLET | ORAL | Status: DC

## 2015-09-19 NOTE — Patient Instructions (Addendum)
Neb xop 0.63     Dx exacerbation asthmatic bronchitis  Depo 80  Script sent for prednisone taper  Keep June appointment
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
## 2015-09-20 NOTE — Assessment & Plan Note (Addendum)
Sustained increased cough may have been a viral infection initially, complicated by pollen. Symptoms are magnified by anxiety and depression related to family problems. Underlying significant chronic asthmatic bronchitis. Plan-nebulizer treatments Xopenex, Depo-Medrol, prednisone taper with steroid talk

## 2015-09-20 NOTE — Assessment & Plan Note (Signed)
Describes several life stressors. His primary physician might consider behavioral health referral for depression.

## 2015-09-20 NOTE — Assessment & Plan Note (Signed)
Reflux precautions strongly emphasized. He is tending to sleep chair.

## 2015-10-03 ENCOUNTER — Ambulatory Visit: Payer: Medicare Other | Admitting: Cardiology

## 2015-10-21 ENCOUNTER — Ambulatory Visit (INDEPENDENT_AMBULATORY_CARE_PROVIDER_SITE_OTHER): Payer: Medicare Other

## 2015-10-21 VITALS — BP 118/86 | HR 64 | Temp 98.5°F | Ht 72.5 in | Wt 244.2 lb

## 2015-10-21 DIAGNOSIS — Z1159 Encounter for screening for other viral diseases: Secondary | ICD-10-CM

## 2015-10-21 DIAGNOSIS — Z0000 Encounter for general adult medical examination without abnormal findings: Secondary | ICD-10-CM

## 2015-10-21 DIAGNOSIS — Z125 Encounter for screening for malignant neoplasm of prostate: Secondary | ICD-10-CM

## 2015-10-21 DIAGNOSIS — Z114 Encounter for screening for human immunodeficiency virus [HIV]: Secondary | ICD-10-CM | POA: Diagnosis not present
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
## 2015-10-21 NOTE — Progress Notes (Signed)
Pre visit review using our clinic review tool, if applicable. No additional management support is needed unless otherwise documented below in the visit note. 

## 2015-10-21 NOTE — Patient Instructions (Signed)
[name] , Thank you for taking time to come for your Medicare Wellness Visit. I appreciate your ongoing commitment to your health goals. Please review the following plan we discussed and let me know if I can assist you in the future.   These are the goals we discussed: Goals    Starting 10/21/2015, I will continue to walk and do leg exercises at least 60 min 4 days per week.       This is a list of the screening recommended for you and due dates:  Health Maintenance  Topic Date Due  . Eye exam for diabetics  10/30/2015*  . Shingles Vaccine  10/20/2016*  . Flu Shot  12/31/2015  . Hemoglobin A1C  01/20/2016  . Pneumonia vaccines (2 of 2 - PPSV23) 06/01/2016  . Complete foot exam   07/22/2016  . Tetanus Vaccine  06/01/2018  . Colon Cancer Screening  03/01/2020  . DTaP/Tdap/Td vaccine  Completed  .  Hepatitis C: One time screening is recommended by Center for Disease Control  (CDC) for  adults born from [birth year] through 1965.   Completed  . HIV Screening  Completed  *Topic was postponed. The date shown is not the original due date.   Preventive Care for Adults  A healthy lifestyle and preventive care can promote health and wellness. Preventive health guidelines for adults include the following key practices.  . A routine yearly physical is a good way to check with your health care provider about your health and preventive screening. It is a chance to share any concerns and updates on your health and to receive a thorough exam.  . Visit your dentist for a routine exam and preventive care every 6 months. Brush your teeth twice a day and floss once a day. Good oral hygiene prevents tooth decay and gum disease.  . The frequency of eye exams is based on your age, health, family medical history, use  of contact lenses, and other factors. Follow your health care provider's ecommendations for frequency of eye exams.  . Eat a healthy diet. Foods like vegetables, fruits, whole grains, low-fat  dairy products, and lean protein foods contain the nutrients you need without too many calories. Decrease your intake of foods high in solid fats, added sugars, and salt. Eat the right amount of calories for you. Get information about a proper diet from your health care provider, if necessary.  . Regular physical exercise is one of the most important things you can do for your health. Most adults should get at least 150 minutes of moderate-intensity exercise (any activity that increases your heart rate and causes you to sweat) each week. In addition, most adults need muscle-strengthening exercises on 2 or more days a week.  Silver Sneakers may be a benefit available to you. To determine eligibility, you may visit the website: www.silversneakers.com or contact program at 912-229-2083 Mon-Fri between 8AM-8PM.   . Maintain a healthy weight. The body mass index (BMI) is a screening tool to identify possible weight problems. It provides an estimate of body fat based on height and weight. Your health care provider can find your BMI and can help you achieve or maintain a healthy weight.   For adults 20 years and older: ? A BMI below 18.5 is considered underweight. ? A BMI of 18.5 to 24.9 is normal. ? A BMI of 25 to 29.9 is considered overweight. ? A BMI of 30 and above is considered obese.   . Maintain normal blood lipids  and cholesterol levels by exercising and minimizing your intake of saturated fat. Eat a balanced diet with plenty of fruit and vegetables. Blood tests for lipids and cholesterol should begin at age 56 and be repeated every 5 years. If your lipid or cholesterol levels are high, you are over 50, or you are at high risk for heart disease, you may need your cholesterol levels checked more frequently. Ongoing high lipid and cholesterol levels should be treated with medicines if diet and exercise are not working.  . If you smoke, find out from your health care provider how to quit. If you do  not use tobacco, please do not start.  . If you choose to drink alcohol, please do not consume more than 2 drinks per day. One drink is considered to be 12 ounces (355 mL) of beer, 5 ounces (148 mL) of wine, or 1.5 ounces (44 mL) of liquor.  . If you are 22-75 years old, ask your health care provider if you should take aspirin to prevent strokes.  . Use sunscreen. Apply sunscreen liberally and repeatedly throughout the day. You should seek shade when your shadow is shorter than you. Protect yourself by wearing long sleeves, pants, a wide-brimmed hat, and sunglasses year round, whenever you are outdoors.  . Once a month, do a whole body skin exam, using a mirror to look at the skin on your back. Tell your health care provider of new moles, moles that have irregular borders, moles that are larger than a pencil eraser, or moles that have changed in shape or color.

## 2015-10-22 LAB — HIV ANTIBODY (ROUTINE TESTING W REFLEX): HIV 1&2 Ab, 4th Generation: NONREACTIVE

## 2015-10-22 LAB — HEPATITIS C ANTIBODY: HCV Ab: NEGATIVE

## 2015-10-22 LAB — PSA, MEDICARE: PSA: 0.35 ng/mL (ref 0.10–4.00)

## 2015-10-24 NOTE — Progress Notes (Signed)
I reviewed health advisor's note, was available for consultation, and agree with documentation and plan.  

## 2015-10-25 ENCOUNTER — Telehealth: Payer: Self-pay | Admitting: Internal Medicine

## 2015-10-25 ENCOUNTER — Ambulatory Visit: Payer: Medicare Other | Admitting: Family Medicine

## 2015-10-25 DIAGNOSIS — Z0289 Encounter for other administrative examinations: Secondary | ICD-10-CM

## 2015-10-25 MED ORDER — CLARITHROMYCIN 500 MG PO TABS: 500.0000 mg | ORAL_TABLET | Freq: Two times a day (BID) | ORAL | Status: DC

## 2015-10-25 NOTE — Telephone Encounter (Signed)
Spoke with pt. States that he thinks he has bronchitis. Reports SOB, chest tightness, wheezing and coughing. Cough is producing yellow mucus. Denies fever. Pt's wife currently has bronchitis and he thinks that he has caught it. Would like an antibiotic sent in.  Allergies  Allergen Reactions  . Ivp Dye [Iodinated Diagnostic Agents]     Omni - Paque Contrast   . Oxycodone Hcl Shortness Of Breath and Rash  . Atorvastatin     REACTION: myalgias  . Bystolic [Nebivolol Hcl]     Nightmares, flashbacks  . Codeine     rash  . Doxycycline     Rash, SOB  . Iohexol      Desc: hives,neck and torso erythemia   . Levaquin [Levofloxacin]     Itching, welps  . Methocarbamol     rash  . Pentazocine Lactate     rash  . Succinylcholine Chloride Other (See Comments)    unknown  . Synvisc [Hylan G-F 20]     rash  . Butorphanol Tartrate Rash  . Dilaudid [Hydromorphone Hcl] Nausea Only and Rash    Aggressive    Current Outpatient Prescriptions on File Prior to Visit  Medication Sig Dispense Refill  . albuterol (PROVENTIL HFA) 108 (90 BASE) MCG/ACT inhaler Inhale 2 puffs into the lungs every 4 (four) hours as needed. For wheezing and/or shortness of breath 3 Inhaler 4  . albuterol (PROVENTIL) (2.5 MG/3ML) 0.083% nebulizer solution Take 3 mLs (2.5 mg total) by nebulization every 6 (six) hours as needed for wheezing or shortness of breath. 75 mL 11  . ALPRAZolam (XANAX) 1 MG tablet Take 1 tablet (1 mg total) by mouth 3 (three) times daily as needed for anxiety or sleep. 90 tablet 0  . aspirin 81 MG chewable tablet Chew 81 mg by mouth daily.    . budesonide-formoterol (SYMBICORT) 160-4.5 MCG/ACT inhaler 2 puffs then rinse mouth well, twice daily 3 Inhaler 4  . carvedilol (COREG) 25 MG tablet TAKE 1 TABLET (25 MG TOTAL) BY MOUTH TWICE DAILY. 180 tablet 0  . hyoscyamine (LEVBID) 0.375 MG 12 hr tablet Take 1 tablet (0.375 mg total) by mouth as needed. For stomach cramps 60 tablet 3  .  irbesartan-hydrochlorothiazide (AVALIDE) 300-12.5 MG tablet TAKE 1 TABLET BY MOUTH DAILY. 30 tablet 5  . lubiprostone (AMITIZA) 24 MCG capsule Take 1 capsule (24 mcg total) by mouth 2 (two) times daily with a meal.    . morphine (MSIR) 30 MG tablet Take 1 tablet (30 mg total) by mouth every 4 (four) hours as needed. FOR PAIN 120 tablet 0  . Multiple Vitamins-Minerals (CENTRUM SILVER PO) Take 1 capsule by mouth daily.    . nitroGLYCERIN (NITROSTAT) 0.4 MG SL tablet Place 0.4 mg under the tongue every 5 (five) minutes as needed for chest pain.    [name] Glycol-Propyl Glycol (SYSTANE) 0.4-0.3 % SOLN Apply 1 drop to eye 2 (two) times daily as needed.    . promethazine (PHENERGAN) 25 MG tablet Take 1 tablet (25 mg total) by mouth every 6 (six) hours as needed. FOR NAUSEA 90 tablet 0  . Respiratory Therapy Supplies (FLUTTER) DEVI Use as directed 1 each 0  . tiotropium (SPIRIVA) 18 MCG inhalation capsule Place 1 capsule (18 mcg total) into inhaler and inhale daily. 90 capsule 4   No current facility-administered medications on file prior to visit.    CY - please advise. Thanks.

## 2015-10-25 NOTE — Telephone Encounter (Addendum)
Called spoke with patient and advised of CDY's recommendation of Biaxin Pt is aware to contact the office if his symptoms do not improve or they worsen Rx sent to verified pharmacy

## 2015-10-25 NOTE — Telephone Encounter (Signed)
Offer Biaxin 500 mg, # 14, 1 twice daily after food

## 2015-10-25 NOTE — Addendum Note (Signed)
Addended by: Parke Poisson E on: 10/25/2015 03:28 PM   Modules accepted: Orders

## 2015-10-29 ENCOUNTER — Ambulatory Visit: Payer: Medicare Other | Admitting: Cardiology

## 2015-10-31 ENCOUNTER — Telehealth: Payer: Self-pay | Admitting: Internal Medicine

## 2015-10-31 MED ORDER — CEFDINIR 300 MG PO CAPS: 300.0000 mg | ORAL_CAPSULE | Freq: Two times a day (BID) | ORAL | Status: DC

## 2015-10-31 MED ORDER — BUDESONIDE-FORMOTEROL FUMARATE 160-4.5 MCG/ACT IN AERO: INHALATION_SPRAY | RESPIRATORY_TRACT | Status: DC

## 2015-10-31 NOTE — Telephone Encounter (Signed)
Spoke with pt. States that he is not feeling any better since calling us on 10/25/15. Reports coughing, wheezing, chest tightness and SOB. Cough is producing yellow mucus. Onset of symptoms was about 2-3 week ago. He has 3 more tablets of Biaxin left to take. Would like CY's recommendations. CY - please advise.  Allergies  Allergen Reactions  . Ivp Dye [Iodinated Diagnostic Agents]     Omni - Paque Contrast   . Oxycodone Hcl Shortness Of Breath and Rash  . Atorvastatin     REACTION: myalgias  . Bystolic [Nebivolol Hcl]     Nightmares, flashbacks  . Codeine     rash  . Doxycycline     Rash, SOB  . Iohexol      Desc: hives,neck and torso erythemia   . Levaquin [Levofloxacin]     Itching, welps  . Methocarbamol     rash  . Pentazocine Lactate     rash  . Succinylcholine Chloride Other (See Comments)    unknown  . Synvisc [Hylan G-F 20]     rash  . Butorphanol Tartrate Rash  . Dilaudid [Hydromorphone Hcl] Nausea Only and Rash    Aggressive    Current Outpatient Prescriptions on File Prior to Visit  Medication Sig Dispense Refill  . albuterol (PROVENTIL HFA) 108 (90 BASE) MCG/ACT inhaler Inhale 2 puffs into the lungs every 4 (four) hours as needed. For wheezing and/or shortness of breath 3 Inhaler 4  . albuterol (PROVENTIL) (2.5 MG/3ML) 0.083% nebulizer solution Take 3 mLs (2.5 mg total) by nebulization every 6 (six) hours as needed for wheezing or shortness of breath. 75 mL 11  . ALPRAZolam (XANAX) 1 MG tablet Take 1 tablet (1 mg total) by mouth 3 (three) times daily as needed for anxiety or sleep. 90 tablet 0  . aspirin 81 MG chewable tablet Chew 81 mg by mouth daily.    . budesonide-formoterol (SYMBICORT) 160-4.5 MCG/ACT inhaler 2 puffs then rinse mouth well, twice daily 3 Inhaler 4  . carvedilol (COREG) 25 MG tablet TAKE 1 TABLET (25 MG TOTAL) BY MOUTH TWICE DAILY. 180 tablet 0  . clarithromycin (BIAXIN) 500 MG tablet Take 1 tablet (500 mg total) by mouth 2 (two) times  daily. 14 tablet 0  . hyoscyamine (LEVBID) 0.375 MG 12 hr tablet Take 1 tablet (0.375 mg total) by mouth as needed. For stomach cramps 60 tablet 3  . irbesartan-hydrochlorothiazide (AVALIDE) 300-12.5 MG tablet TAKE 1 TABLET BY MOUTH DAILY. 30 tablet 5  . lubiprostone (AMITIZA) 24 MCG capsule Take 1 capsule (24 mcg total) by mouth 2 (two) times daily with a meal.    . morphine (MSIR) 30 MG tablet Take 1 tablet (30 mg total) by mouth every 4 (four) hours as needed. FOR PAIN 120 tablet 0  . Multiple Vitamins-Minerals (CENTRUM SILVER PO) Take 1 capsule by mouth daily.    . nitroGLYCERIN (NITROSTAT) 0.4 MG SL tablet Place 0.4 mg under the tongue every 5 (five) minutes as needed for chest pain.    [name] Glycol-Propyl Glycol (SYSTANE) 0.4-0.3 % SOLN Apply 1 drop to eye 2 (two) times daily as needed.    . promethazine (PHENERGAN) 25 MG tablet Take 1 tablet (25 mg total) by mouth every 6 (six) hours as needed. FOR NAUSEA 90 tablet 0  . Respiratory Therapy Supplies (FLUTTER) DEVI Use as directed 1 each 0  . tiotropium (SPIRIVA) 18 MCG inhalation capsule Place 1 capsule (18 mcg total) into inhaler and inhale daily. 90 capsule 4  No current facility-administered medications on file prior to visit.

## 2015-10-31 NOTE — Telephone Encounter (Signed)
Offer cefdinir 300 mg, # 20, 1 twice daily 

## 2015-10-31 NOTE — Telephone Encounter (Signed)
Patient called back - please call him on his cell [phone]

## 2015-10-31 NOTE — Telephone Encounter (Signed)
Spoke with pt. He is aware of CY's recommendations. Rx has been sent in. Pt also needed a refill on Symbicort. Nothing further was needed.

## 2015-10-31 NOTE — Telephone Encounter (Signed)
lmtcb x1 for pt. 

## 2015-11-07 ENCOUNTER — Telehealth: Payer: Self-pay | Admitting: Internal Medicine

## 2015-11-07 NOTE — Telephone Encounter (Signed)
lmtcb X1 for pt.  Pt can be scheduled in an open slot if an acute visit is needed.[name]

## 2015-11-08 ENCOUNTER — Ambulatory Visit (INDEPENDENT_AMBULATORY_CARE_PROVIDER_SITE_OTHER): Payer: Medicare Other | Admitting: Internal Medicine

## 2015-11-08 ENCOUNTER — Encounter: Payer: Self-pay | Admitting: Internal Medicine

## 2015-11-08 ENCOUNTER — Telehealth: Payer: Self-pay | Admitting: Internal Medicine

## 2015-11-08 VITALS — BP 138/82 | HR 65 | Ht 73.0 in | Wt 246.8 lb

## 2015-11-08 DIAGNOSIS — J441 Chronic obstructive pulmonary disease with (acute) exacerbation: Secondary | ICD-10-CM | POA: Diagnosis not present

## 2015-11-08 MED ORDER — CLARITHROMYCIN 500 MG PO TABS: 500.0000 mg | ORAL_TABLET | Freq: Two times a day (BID) | ORAL | Status: DC

## 2015-11-08 NOTE — Assessment & Plan Note (Signed)
Recurrent exacerbation of chronic bronchitis. Previously culture positive Enterobacter thought to reflect GI source with aspiration after multiple abdominal surgeries and episodes of bowel obstruction. Also note bad dentition.

## 2015-11-08 NOTE — Patient Instructions (Addendum)
Script printed to extend Biaxin another week. You can ask tomorrow if the [place] can pay for this.  It would work well if your [place] primary doctor could get you attached to Pulmonary and Infectious Disease specialists at the [place] have a fungal infection in your fingernails, they can also treat at the [place]

## 2015-11-08 NOTE — Telephone Encounter (Signed)
Sorry I did not call the patient; I believe it came from a previous phone note prior to patient being seen this morning.

## 2015-11-08 NOTE — Telephone Encounter (Signed)
Pt was seen today.  Will sign off on msg.

## 2015-11-08 NOTE — Telephone Encounter (Signed)
Spoke with the pt  He states that someone called him after his appt with [place] today 3 x and never left a msg  I do not see where anything was documented  I told him it may have been a mistake, or a different office that called? I advised that I will check with Joellen Jersey and only call back if necc  He verbalized understanding and nothing further needed  Joellen Jersey, did you call him? If not just close this please, thanks!
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
## 2015-11-11 ENCOUNTER — Ambulatory Visit: Payer: Medicare Other | Admitting: Internal Medicine

## 2015-12-06 ENCOUNTER — Ambulatory Visit: Payer: Medicare Other | Admitting: Family Medicine

## 2015-12-10 ENCOUNTER — Ambulatory Visit (INDEPENDENT_AMBULATORY_CARE_PROVIDER_SITE_OTHER): Payer: Medicare Other | Admitting: Primary Care

## 2015-12-10 VITALS — BP 148/82 | HR 67 | Temp 98.1°F | Ht 73.0 in | Wt 244.1 lb

## 2015-12-10 DIAGNOSIS — R109 Unspecified abdominal pain: Secondary | ICD-10-CM | POA: Diagnosis not present

## 2015-12-10 DIAGNOSIS — R2231 Localized swelling, mass and lump, right upper limb: Secondary | ICD-10-CM | POA: Diagnosis not present

## 2015-12-10 LAB — POC URINALSYSI DIPSTICK (AUTOMATED)
Bilirubin, UA: NEGATIVE
Blood, UA: NEGATIVE
GLUCOSE UA: NEGATIVE
Ketones, UA: NEGATIVE
LEUKOCYTES UA: NEGATIVE
Nitrite, UA: NEGATIVE
PROTEIN UA: NEGATIVE
Spec Grav, UA: 1.02
Urobilinogen, UA: 2
pH, UA: 7
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
## 2015-12-10 NOTE — Patient Instructions (Signed)
Stop by the front desk and speak with either Rosaria Ferries or Ebony Hail regarding your Ultrasound.  Please notify myself or Dr. Danise Mina if your symptoms become worse, you develop nausea/vomiting, fevers, increased abdominal pain.  It was a pleasure meeting you!

## 2015-12-10 NOTE — Progress Notes (Signed)
Pre visit review using our clinic review tool, if applicable. No additional management support is needed unless otherwise documented below in the visit note. 

## 2015-12-12 ENCOUNTER — Ambulatory Visit (HOSPITAL_COMMUNITY): Admission: RE | Admit: 2015-12-12 | Payer: Medicare Other | Source: Ambulatory Visit

## 2015-12-16 ENCOUNTER — Ambulatory Visit (INDEPENDENT_AMBULATORY_CARE_PROVIDER_SITE_OTHER): Payer: Medicare Other | Admitting: Cardiology

## 2015-12-16 ENCOUNTER — Encounter: Payer: Self-pay | Admitting: Cardiology

## 2015-12-16 VITALS — BP 164/83 | HR 80 | Wt 244.0 lb

## 2015-12-16 DIAGNOSIS — E785 Hyperlipidemia, unspecified: Secondary | ICD-10-CM

## 2015-12-16 DIAGNOSIS — I6523 Occlusion and stenosis of bilateral carotid arteries: Secondary | ICD-10-CM | POA: Diagnosis not present

## 2015-12-16 DIAGNOSIS — I10 Essential (primary) hypertension: Secondary | ICD-10-CM

## 2015-12-16 DIAGNOSIS — I350 Nonrheumatic aortic (valve) stenosis: Secondary | ICD-10-CM | POA: Diagnosis not present

## 2015-12-16 DIAGNOSIS — R0600 Dyspnea, unspecified: Secondary | ICD-10-CM

## 2015-12-16 DIAGNOSIS — R0609 Other forms of dyspnea: Secondary | ICD-10-CM

## 2015-12-16 MED ORDER — HYDRALAZINE HCL 25 MG PO TABS
25.0000 mg | ORAL_TABLET | ORAL | Status: DC | PRN
Start: 2015-12-16 — End: 2016-03-30

## 2015-12-16 NOTE — Progress Notes (Signed)
PCP: Ria Bush, MD  Clinic Note: No chief complaint on file.   HPI: [name] is a 65 y.o. male with a PMH below who presents today for 15 month follow-up. He has a diagnosis of CHF as well as labile hypertension in the past. He also is mildly asked by echo. He's had a normal cardiac catheterization and negative Myoview in the past. He is a [place] veteran with history of agent orange exposure. He has had multiple combat wounds as well and has had several operations of the surgery including a redo [place] surgery. He has had several evaluations for bowel obstruction.[name] was last seen in April 2016 -- noted left greater than right leg swelling. Otherwise no active cardiac symptoms. exertional dyspnea, and muscular skeletal chest discomfort. Intermittent palpitations  Recent Hospitalizations: None  Studies Reviewed: None  Interval History: Jacey finally comes in for his delayed annual visit. He has had lots of other issues happening over the last few months including hospitalizations for pneumonia etc. He actually seems to be relatively stable today without too much the way of any significant complaints. He still has some mild intermittent swelling off and on but his To relatively well-controlled. He's not requiring diuretic. He wears support stockings when possible. Apparently sometime since last visit, he was told he may have had a TIA. On did not hear any about that. He also has worsening dental disease and is in need of significant dental work. No recent episodes of bowel obstruction leading hospitalizations. He has some mild baseline exertional dyspnea, but not at rest. He is extremely deconditioned though and does not do any routine exercise like activity..  Cardiac review of symptoms: Intermittent episodes of musculoskeletal chest pain (short-lived twinges with certain movements) with rest or exertion. No PND, orthopnea or edema. No palpitations,  lightheadedness, dizziness, weakness or syncope/near syncope. No TIA/amaurosis fugax symptoms. No melena, hematochezia, hematuria, or epstaxis. No claudication.  ROS: A comprehensive was performed. Review of Systems  Constitutional: Positive for malaise/fatigue (Overall this lack of energy. Nothing new.).  HENT: Negative for nosebleeds.   Respiratory: Positive for shortness of breath (Mild baseline).   Cardiovascular: Positive for chest pain (Musculoskeletal.) and leg swelling (Minimal).  Gastrointestinal: Positive for abdominal pain (Intermittent episodes of pain from partially incarcerated hernia.) and nausea. Negative for blood in stool, melena and vomiting.  Musculoskeletal: Positive for back pain, joint pain, myalgias and neck pain.  Neurological: Positive for dizziness and headaches.  Psychiatric/Behavioral: Positive for depression (Although he doesn't seem to make much of it.) and hallucinations. The patient is nervous/anxious.      Past Medical History:  Diagnosis Date  . Anemia   . Asthma   . Back pain, chronic, followed at pain clinic 11/25/2011  . Carotid artery stenosis 09/06/2014  . Chest pain with low risk for cardiac etiology 10/2011   Non-obstructive CAD by Cath; negative Lexiscan in 08/2011  . Clotting disorder (HCC)    LLE DVT (also small vessel)  . Colon polyps   . COPD (chronic obstructive pulmonary disease) ([place])   . Dementia    Mild  . Depression   . Dyslipidemia (high LDL; low HDL)    statin intolerant; on fibrate  . Edema leg    Wears compression stockings; 08/2012 dopplers - no DVT or thrombophlebitis; mild R Popliteal V reflux - no VNUS ablation candidate  . Fainting   . Family history of acute myocardial infarction. and premature CAD 11/25/2011  . GERD (gastroesophageal  reflux disease)   . Hearing loss   . History of diabetes mellitus   . Hypertension    very labile  . Mild aortic stenosis by prior echocardiogram 03/2012   moderate sclerosis  .  Nephrolithiasis   . Osteoarthritis   . Other idiopathic peripheral autonomic neuropathy (HCC)    Agent orange  . PTSD (post-traumatic stress disorder) 07/24/2015  . Seizure ([place])   . Small bowel obstruction ([place]) 1990s, 2001, 2015   s/p multiple bowel surgeries; from war wounds  . Stroke ([place])   . Weakness     Past Surgical History:  Procedure Laterality Date  . APPENDECTOMY  1958   per patient  . CARDIOVASCULAR STRESS TEST  08/2011   Negative lexiscan myoview  . COLONOSCOPY    . DOPPLER ECHOCARDIOGRAPHY  2012   Essentially normal wirth moderate concentric LVH and moderate aortic sclerosis   . exploratory laparotomy with extensive lysis of adhesions  10/1999  . JOINT REPLACEMENT     right knee replaced 2x  . LEFT HEART CATHETERIZATION WITH CORONARY ANGIOGRAM N/A 11/26/2011   WNL Lorretta Harp, MD  . small bowel obstruction  1996, 1999, 2001, 2015  . TONSILLECTOMY  1960   per patient  . TOTAL KNEE ARTHROPLASTY      Prior to Admission medications   Medication Sig Start Date End Date Taking? Authorizing Provider  albuterol (PROVENTIL HFA) 108 (90 BASE) MCG/ACT inhaler Inhale 2 puffs into the lungs every 4 (four) hours as needed. For wheezing and/or shortness of breath 10/09/14  Yes Deneise Lever, MD  albuterol (PROVENTIL) (2.5 MG/3ML) 0.083% nebulizer solution Take 3 mLs (2.5 mg total) by nebulization every 6 (six) hours as needed for wheezing or shortness of breath. 01/10/15  Yes Deneise Lever, MD  ALPRAZolam Duanne Moron) 1 MG tablet Take 1 tablet (1 mg total) by mouth 3 (three) times daily as needed for anxiety or sleep. 01/22/14  Yes Theodis Blaze, MD  aspirin 81 MG chewable tablet Chew 81 mg by mouth daily.   Yes Historical Provider, MD  budesonide-formoterol [facility]) 160-4.5 MCG/ACT inhaler 2 puffs then rinse mouth well, twice daily 10/31/15  Yes Clinton D Young, MD  carvedilol (COREG) 25 MG tablet TAKE 1 TABLET (25 MG TOTAL) BY MOUTH TWICE DAILY. 08/14/15  Yes Leonie Man, MD   clarithromycin (BIAXIN) 500 MG tablet Take 1 tablet (500 mg total) by mouth 2 (two) times daily. 11/08/15  Yes Deneise Lever, MD  hyoscyamine (LEVBID) 0.375 MG 12 hr tablet Take 1 tablet (0.375 mg total) by mouth as needed. For stomach cramps 03/28/14  Yes Susy Frizzle, MD  irbesartan-hydrochlorothiazide (AVALIDE) 300-12.5 MG tablet TAKE 1 TABLET BY MOUTH DAILY. 04/29/15  Yes Leonie Man, MD  lubiprostone (AMITIZA) 24 MCG capsule Take 1 capsule (24 mcg total) by mouth 2 (two) times daily with a meal. 07/23/15  Yes Ria Bush, MD  morphine (MSIR) 30 MG tablet Take 1 tablet (30 mg total) by mouth every 4 (four) hours as needed. FOR PAIN 01/22/14  Yes Theodis Blaze, MD  Multiple Vitamins-Minerals (CENTRUM SILVER PO) Take 1 capsule by mouth daily.   Yes Historical Provider, MD  nitroGLYCERIN (NITROSTAT) 0.4 MG SL tablet Place 0.4 mg under the tongue every 5 (five) minutes as needed for chest pain.   Yes Historical Provider, MD  Polyethyl Glycol-Propyl Glycol (SYSTANE) 0.4-0.3 % SOLN Apply 1 drop to eye 2 (two) times daily as needed.   Yes Historical Provider, MD  promethazine (  PHENERGAN) 25 MG tablet Take 1 tablet (25 mg total) by mouth every 6 (six) hours as needed. FOR NAUSEA 01/22/14  Yes Theodis Blaze, MD  Respiratory Therapy Supplies (FLUTTER) DEVI Use as directed 01/10/15  Yes Deneise Lever, MD  tiotropium (SPIRIVA) 18 MCG inhalation capsule Place 1 capsule (18 mcg total) into inhaler and inhale daily. 10/09/14  Yes Deneise Lever, MD   Allergies  Allergen Reactions  . Ivp Dye [Iodinated Diagnostic Agents]     Omni - Paque Contrast   . Oxycodone Hcl Shortness Of Breath and Rash  . Atorvastatin     REACTION: myalgias  . Bystolic [Nebivolol Hcl]     Nightmares, flashbacks  . Codeine     rash  . Doxycycline     Rash, SOB  . Iohexol      Desc: hives,neck and torso erythemia   . Levaquin [Levofloxacin]     Itching, welps  . Methocarbamol     rash  . Pentazocine Lactate      rash  . Succinylcholine Chloride Other (See Comments)    unknown  . Synvisc [Hylan G-F 20]     rash  . Butorphanol Tartrate Rash  . Dilaudid [Hydromorphone Hcl] Nausea Only and Rash    Aggressive      Social History   Social History  . Marital status: Married    Spouse name: N/A  . Number of children: 3  . Years of education: N/A   Occupational History  . Disabled    Social History Main Topics  . Smoking status: Never Smoker  . Smokeless tobacco: Never Used  . Alcohol use No  . Drug use: No  . Sexual activity: Yes   Other Topics Concern  . None   Social History Narrative   He is a father of 70, grandfather 31.    One of his sons was killed during the war in [place]. Grandson killed bike accident 2017.   He was exposed to [employer].   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   Not very physically active due to extreme disability/debilitation from osteoarthritis and peripheral neuropathy.   Family History  Problem Relation Age of Onset  . Stroke Mother   . Heart disease Mother   . Heart attack Mother   . Heart disease Father   . Heart attack Father   . Heart attack Sister   . Heart attack Brother   . Hypertension Brother   . Heart attack Son   . Stroke Son   . Hypertension Brother   . Sleep apnea Son   . Non-Hodgkin's lymphoma Daughter     Wt Readings from Last 3 Encounters:  12/16/15 244 lb (110.7 kg)  12/10/15 244 lb 1.9 oz (110.7 kg)  11/08/15 246 lb 12.8 oz (111.9 kg)    PHYSICAL EXAM BP (!) 164/83   Pulse 80   Wt 244 lb (110.7 kg)   SpO2 97%   BMI 32.19 kg/m   General appearance: A&Ox3, cooperative, appears stated age, no distress and mildly obese Neck: no LAN, no carotid bruit, no JVD, supple, symmetrical, trachea midline and thyroid not enlarged, symmetric, no tenderness/mass/nodules Lungs: CTA B., normal percussion bilaterally and Nonlabored Heart: regular rate and rhythm, S1, S2  normal, no S3 or S4; 2/6, harsh crescendo- decrescendo SEM @ 2nd right intercostal space, radiates to carotids, no click and no rub Abdomen: normal findings: bowel sounds normal, liver span normal  to percussion, no masses palpable and no organomegaly and abnormal findings: incisional hernia and extensive scars from his multiple surgeries. There is some tenderness along these areas but not overall. Extremities: no noteable erythema; + venous stasis dermatitis noted; LLE 2+ edema, RLE 1+    Adult ECG Report  Rate: 80 ;  Rhythm: normal sinus rhythm and Normal axis, intervals and durations.;   Narrative Interpretation: Normal EKG.   Other studies Reviewed: Additional studies/ records that were reviewed today include:  Recent Labs:   Lab Results  Component Value Date   CHOL 207 (H) 07/23/2015   HDL 37.70 (L) 07/23/2015   LDLCALC 133 (H) 07/23/2015   LDLDIRECT 66.4 02/02/2007   TRIG 181.0 (H) 07/23/2015   CHOLHDL 5 07/23/2015    ASSESSMENT / PLAN: Problem List Items Addressed This Visit    Mild aortic stenosis by prior echocardiogram - Primary (Chronic)    Was supposed to have an echocardiogram done prior to this visit. However never did get done. We will go ahead and reorder echocardiogram to assess aortic valve as well as overall cardiac function.      Relevant Medications   hydrALAZINE (APRESOLINE) 25 MG tablet   Other Relevant Orders   EKG 12-Lead (Completed)   ECHOCARDIOGRAM COMPLETE   Hyperlipidemia with target LDL less than 100 (Chronic)    Not currently on any antiplatelet cholesterol medications. He has had intolerance in the past. This is being treated by PCP. Last labs did not show that he was at goal.      Relevant Medications   hydrALAZINE (APRESOLINE) 25 MG tablet   Essential hypertension (Chronic)    Still not quite well enough controlled today. He is on a good dose of carvedilol as well as Avalide. He for the most part says his blood pressures better controlled  home. What I would like to do is have a when necessary medication for increased blood pressure: Hydralazine 25 mg when necessary every 6 hours for systolic blood pressure greater than 150 mmHg.       Relevant Medications   hydrALAZINE (APRESOLINE) 25 MG tablet   Other Relevant Orders   EKG 12-Lead (Completed)   ECHOCARDIOGRAM COMPLETE   DOE (dyspnea on exertion) (Chronic)    Again this is pretty much his baseline. He is not very active, and it is hard to tell any of it all is related to cardiac issues. Prior cardiac evaluations have been relatively benign. Again he was supposed to have an echo done in 2016 that was not done. Plan: Check 2-D echocardiogram to assess filling pressures, EF and valvular function.      Carotid artery stenosis    Has mild to moderate carotid disease based on prior Doppler studies.  Plan: Recheck carotid artery Dopplers.      Relevant Medications   hydrALAZINE (APRESOLINE) 25 MG tablet    Other Visit Diagnoses   None.     Current medicines are reviewed at length with the patient today. (+/- concerns) n/a The following changes have been made: none start new prescription for hydralazine as needed for blood pressure greater than Q000111Q systolic (top number)   Studies Ordered:   Orders Placed This Encounter  Procedures  . EKG 12-Lead  . ECHOCARDIOGRAM COMPLETE   Your physician has recommended you make the following change in your medication:   You have been cleared to have your dental extractions done.  Your physician wants you to follow-up in: 1 year or sooner if needed.  [name], M.D., M.S. Interventional Cardiologist   Pager # [phone] Phone # [phone] [address]. [place], [place] [address]

## 2015-12-16 NOTE — Patient Instructions (Addendum)
Your physician has requested that you have an echocardiogram. Echocardiography is a painless test that uses sound waves to create images of your heart. It provides your doctor with information about the size and shape of your heart and how well your heart's chambers and valves are working. This procedure takes approximately one hour. There are no restrictions for this procedure.  Your physician has requested that you have a carotid duplex. This test is an ultrasound of the carotid arteries in your neck. It looks at blood flow through these arteries that supply the brain with blood. Allow one hour for this exam. There are no restrictions or special instructions.  Your physician has recommended you make the following change in your medication:   1.) start new prescription for hydralazine as needed for blood pressure greater than Q000111Q systolic (top number)  You have been cleared to have your dental extractions done.  Your physician wants you to follow-up in: 1 year or sooner if needed. You will receive a reminder letter in the mail two months in advance. If you don't receive a letter, please call our office to schedule the follow-up appointment.  If you need a refill on your cardiac medications before your next appointment, please call your pharmacy.

## 2015-12-22 ENCOUNTER — Encounter: Payer: Self-pay | Admitting: Cardiology

## 2015-12-22 NOTE — Assessment & Plan Note (Signed)
Not currently on any antiplatelet cholesterol medications. He has had intolerance in the past. This is being treated by PCP. Last labs did not show that he was at goal.

## 2015-12-22 NOTE — Assessment & Plan Note (Signed)
Was supposed to have an echocardiogram done prior to this visit. However never did get done. We will go ahead and reorder echocardiogram to assess aortic valve as well as overall cardiac function.

## 2015-12-22 NOTE — Assessment & Plan Note (Signed)
Has mild to moderate carotid disease based on prior Doppler studies.  Plan: Recheck carotid artery Dopplers.

## 2015-12-22 NOTE — Assessment & Plan Note (Signed)
Again this is pretty much his baseline. He is not very active, and it is hard to tell any of it all is related to cardiac issues. Prior cardiac evaluations have been relatively benign. Again he was supposed to have an echo done in 2016 that was not done. Plan: Check 2-D echocardiogram to assess filling pressures, EF and valvular function.

## 2015-12-22 NOTE — Assessment & Plan Note (Signed)
Still not quite well enough controlled today. He is on a good dose of carvedilol as well as Avalide. He for the most part says his blood pressures better controlled home. What I would like to do is have a when necessary medication for increased blood pressure: Hydralazine 25 mg when necessary every 6 hours for systolic blood pressure greater than 150 mmHg.

## 2015-12-23 ENCOUNTER — Inpatient Hospital Stay (HOSPITAL_COMMUNITY): Admission: RE | Admit: 2015-12-23 | Payer: Medicare Other | Source: Ambulatory Visit

## 2015-12-27 ENCOUNTER — Ambulatory Visit (HOSPITAL_COMMUNITY): Admission: RE | Admit: 2015-12-27 | Payer: Medicare Other | Source: Ambulatory Visit

## 2015-12-31 ENCOUNTER — Telehealth: Payer: Self-pay | Admitting: Internal Medicine

## 2015-12-31 ENCOUNTER — Other Ambulatory Visit (HOSPITAL_COMMUNITY): Payer: Medicare Other

## 2015-12-31 NOTE — Telephone Encounter (Signed)
Call transferred - spoke with Dr Hardin Negus @ [facility] who reported he saw pt today and pt's cough was worse than it was when he saw pt 1 week ago and is currently on his 2nd round of abx.  Dr Hardin Negus and pt would like pt to be seen again by CY.  Advised Dr Hardin Negus will need to speak with CY about appt - Dr Hardin Negus okay with this and voiced his understanding.  Pt was not in the office at the time of the call.  Dr Annamaria Boots please advise of work in appt or if pt may see an NP, thank you. LMOM TCB x1 for pt informing him that we are working on an appt  Per 6.9.17 ov w/ CY: Patient Instructions   Script printed to extend Biaxin another week. You can ask tomorrow if the [place] can pay for this.   It would work well if your [facility] primary doctor could get you attached to Pulmonary and Infectious Disease specialists at the [facility] have a fungal infection in your fingernails, they can also treat at the [place]

## 2015-12-31 NOTE — Telephone Encounter (Signed)
Pt returned call Dr Hardin Negus is actually his dentist - pt had teeth pulled last week to have a partial made.  He finished his most recent round of abx (for the tooth extraction) on 7/27.  On 7/30 he developed a prod cough with yellow/brown mucus, wheezing and tightness in his chest.  Ran a temp of 100-101 on 7/30 - 7/31.  He did end up going to the [place] ER at onset of symptoms and they recommended he follow up with Dr Hardin Negus, which he did today.  Dr Annamaria Boots please advise, thank you.

## 2015-12-31 NOTE — Telephone Encounter (Signed)
Suggest NP can see sooner

## 2015-12-31 NOTE — Telephone Encounter (Signed)
Spoke with the pt and have scheduled to see SG tomorrow 01/01/16

## 2016-01-01 ENCOUNTER — Ambulatory Visit: Payer: Medicare Other | Admitting: Acute Care

## 2016-01-03 ENCOUNTER — Ambulatory Visit (HOSPITAL_COMMUNITY): Admission: RE | Admit: 2016-01-03 | Payer: Medicare Other | Source: Ambulatory Visit

## 2016-01-04 ENCOUNTER — Telehealth: Payer: Self-pay | Admitting: Internal Medicine

## 2016-01-04 MED ORDER — CLARITHROMYCIN 500 MG PO TABS: 500.0000 mg | ORAL_TABLET | Freq: Two times a day (BID) | ORAL | 0 refills | Status: DC

## 2016-01-04 NOTE — Telephone Encounter (Signed)
Bed- bound by back pain. Cough from chronic bronchitis aggravating. Couldn't get oob to make most recent appt w TP. Plan- Rx Biaxin sent to [facility]

## 2016-01-06 ENCOUNTER — Ambulatory Visit (HOSPITAL_COMMUNITY): Payer: Medicare Other

## 2016-01-06 ENCOUNTER — Telehealth: Payer: Self-pay

## 2016-01-06 NOTE — Telephone Encounter (Signed)
PLEASE NOTE: All timestamps contained within this report are represented as [place] Standard Time. CONFIDENTIALTY NOTICE: This fax transmission is intended only for the addressee. It contains information that is legally privileged, confidential or otherwise protected from use or disclosure. If you are not the intended recipient, you are strictly prohibited from reviewing, disclosing, copying using or disseminating any of this information or taking any action in reliance on or regarding this information. If you have received this fax in error, please notify us immediately by telephone so that we can arrange for its return to [place]. Phone: [phone], Toll-Free: [phone], Fax: [phone] Page: 1 of 1 Call Id: [id number] [place] Patient Name: [name] Gender: Male DOB: November 05, 1950 Age: 65 Y 10 M 24 D Return Phone Number: [phone] (Primary), [phone] (Secondary) Address: City/State/Zip: [place] Client [facility] - Client Client Site [place] - Day Physician Ria Bush - MD Contact Type Call Who Is Calling Patient / Member / Family / Caregiver Call Type Triage / Clinical Caller Name [name] Relationship To Patient Spouse Return Phone Number [phone] (Primary) Chief Complaint Mouth Symptoms Reason for Call Symptomatic / Request for [place] states husband had oral surgery 3 wks ago, packing came out. Had dry socket, and has respiratory bronchitis, causing him to cough a lot. The packing keeps coming out and he puts it back in. In a lot of mouth pain. He twisted his back, so she can't get him in the car to bring him to the office. **CBWN Wife called said she no longer needs [place] to call her that she got intouch with another DR. Appointment Disposition EMR Caller Not Reached Info pasted  into Epic No Translation No Nurse Assessment Guidelines Guideline Title Affirmed Question Affirmed Notes Nurse Date/Time (Eastern Time) Disp. Time Eilene Ghazi Time) Disposition Final User 01/04/2016 12:21:59 PM Attempt made - line busy [place], [place], Sherre Poot 01/04/2016 12:22:40 PM FINAL ATTEMPT MADE - message left Yes Markus Daft, RN, Sherre Poot

## 2016-01-06 NOTE — Telephone Encounter (Signed)
See phone note Dr Annamaria Boots on 01/04/16.

## 2016-01-09 NOTE — Telephone Encounter (Signed)
Noted  

## 2016-01-10 ENCOUNTER — Ambulatory Visit (HOSPITAL_COMMUNITY): Admission: RE | Admit: 2016-01-10 | Payer: Medicare Other | Source: Ambulatory Visit

## 2016-01-13 ENCOUNTER — Ambulatory Visit (INDEPENDENT_AMBULATORY_CARE_PROVIDER_SITE_OTHER): Payer: Medicare Other | Admitting: Internal Medicine

## 2016-01-13 ENCOUNTER — Telehealth: Payer: Self-pay | Admitting: Internal Medicine

## 2016-01-13 ENCOUNTER — Encounter: Payer: Self-pay | Admitting: Internal Medicine

## 2016-01-13 VITALS — BP 120/70 | HR 64 | Ht 73.0 in | Wt 239.2 lb

## 2016-01-13 DIAGNOSIS — J479 Bronchiectasis, uncomplicated: Secondary | ICD-10-CM | POA: Diagnosis not present

## 2016-01-13 DIAGNOSIS — J441 Chronic obstructive pulmonary disease with (acute) exacerbation: Secondary | ICD-10-CM | POA: Diagnosis not present

## 2016-01-13 MED ORDER — CLARITHROMYCIN 500 MG PO TABS: 500.0000 mg | ORAL_TABLET | Freq: Two times a day (BID) | ORAL | 0 refills | Status: DC

## 2016-01-13 NOTE — Patient Instructions (Addendum)
Order- Lab- sputum cultures   Routine, fungal, AFB    Dx bronchiectasis  Order- office spirometry     Dx bronchiectasis  Order- schedule CT chest no contrast     Dx bronchiectasis  Script sent for Biaxin as discussed to control your chest infection while you get the ultrasound

## 2016-01-13 NOTE — Assessment & Plan Note (Signed)
Primarily chronic bronchitis with exacerbation. Suspect bronchiectasis. If we can demonstrate this, he may be a candidate for a pneumatic vest therapy. His flutter device is been only partially helpful. We will provide an extra course of Biaxin to suppress airways disease so he didn't get the ultrasound test of his right axilla mass.

## 2016-01-13 NOTE — Telephone Encounter (Signed)
LMOMTCB to schedule appt below.

## 2016-01-13 NOTE — Progress Notes (Addendum)
HPI  M Veteran, Never smoker followed for Bronchiectasis/recurrent pneumonia/Enterobacter due to reflux, complicated by DM, HBP, lung nodule, GERD, ischemic colitis/ multiple surgeries, valvular heart disease,   09/19/2015-65 year old male never smoker followed for asthma/bronchitis/bronchiectasis/Enterobacter, complicated  by DM, HBP, lung nodule, GERD, ischemic colitis, valvular heart disease ACUTE VISIT: Increased cough, SOB and wheezing, sweats as well. Pt states this past week has been rough due to increase pollen-"feels like I can not breathe". Acute symptoms now for the past 3 months, wife pressed him to come in. Strong odors are a trigger. Sometimes sleeps sitting up. Nebulizer machine helps. He did see Infectious Disease/Dr. Megan Salon in January with our concern that sputum had cultured Enterobacter. He reported they didn't feel need to intervene. Repeat sputum cultures were negative February 2017. Wife having troubles with blood dyscrasia somehow causing her to need gum surgery. He is feeling very stressed. grandson died in a bike accident. CXR 2015/07/06 IMPRESSION: Mild bronchitic changes. Electronically Signed  By: Nolon Nations M.D.  On: 2015/07/06 16:09  11/08/2015-65 year old male [name] never smoker followed for Bronchiectasis/recurrent pneumonia/Enterobacter, complicated by DM, HBP, lung nodule, GERD, ischemic colitis, valvular heart disease Biaxin ordered 10/25/15 phone report bronchitis FOLLOWS FOR:Pt states he is retaining fluid-gained 20-24# in past 4 days. SOB and wheezing. Pt unable to cough anything up. He is getting established at the [facility] with primary care visit on Monday. He plans to ask for dental, pulmonary and infectious disease referral's within the [place] system which would work very well for him. Biaxin is helping some with cough and we agreed to extend it. Using Symbicort with occasional rescue inhaler and nebulizer  treatments.  01/13/2016-65 year old male [place] ever smoker, followed for Bronchiectasis/recurrent pneumonia/Enterobacter/recurrent aspiration, complicated by DM, HBP, lung nodule, GERD, ischemic colitis/multiple surgeries, valvular heart disease FOLLOW FOR: coughing up brownish, yellow, green colored mucus (brought sample with him); just finished Clarithymycin from dental procedure. Wife here He had significant exacerbation of productive cough which is partially responded to Biaxin, now completed. Sputum remains purulent. Low-grade fevers and sweats. Pending ultrasound of right axillary mass "lipoma growing". He was told he needed to be coughing less and afebrile before this procedure. He brings a sample of recent sputum looking like yellow brown putty Addendum: I reviewed his Chest CT from 01/31/16 and there are dilated and thickened airways consistent with bronchiectasis, and this diagnosis is also supported by his clinical presentation. His Flutter device does not adequately mobilize secretions, which needs to happen if we are going to reduce his symptoms and need for frequent antibiotics.   ROS-   += pos Constitutional:   No-   weight loss, night sweats, +fevers, chills, fatigue, lassitude. HEENT:   No-  headaches, difficulty swallowing, +tooth/dental problems, sore throat,        sneezing, itching, ear ache, nasal congestion, post nasal drip,  CV:      chest pain, +orthopnea, PND, swelling in lower extremities, anasarca, dizziness, +palpitations Resp: + shortness of breath with exertion or at rest.              +  productive cough,  No non-productive cough,              +change in color of mucus.  No- wheezing.   Skin: No-   rash or lesions. GI:  No-   heartburn, indigestion, +abdominal pain, nausea, vomiting,  GU:  MS:  +  joint pain or swelling.  Neuro-     nothing unusual Psych:  No- change in mood or  affect. + depression or anxiety.  No memory loss  OBJ- Physical Exam    General-  Alert, Oriented, + anxious/depressed, Distress- none acute, + overweight Skin- + fungal nail changes right hand and also reported on toes of both feet Lymphadenopathy- none Head- atraumatic            Eyes- Gross vision intact, PERRLA, conjunctivae and secretions clear            Ears- Hearing, canals-normal            Nose-  no-Septal dev,polyps, erosion, perforation             Throat- Mallampati II , mucosa clear , drainage- none, tonsils- atrophic. + Missing teeth,  Neck- flexible , trachea midline, no stridor , thyroid nl, carotid no bruit Chest - symmetrical excursion , unlabored           Heart/CV- RRR ,  murmur + trace aortic , no gallop  , no rub, nl s1 s2                           - JVD- none , edema- none, stasis changes- none, varices- none           Lung-  + few  rhonchi, cough+ loose , wheeze -none                  dullness-none, rub- none           Chest wall-  Abd- protuberant Br/ Gen/ Rectal- Not done, not indicated Extrem-  +cane,  Neuro- grossly intact to observation

## 2016-01-13 NOTE — Telephone Encounter (Signed)
Pt can be seen at 4:00pm (63minute) on 05-14-16. Thanks.

## 2016-01-14 ENCOUNTER — Other Ambulatory Visit (HOSPITAL_COMMUNITY): Payer: Medicare Other

## 2016-01-14 ENCOUNTER — Encounter (HOSPITAL_COMMUNITY): Payer: Medicare Other

## 2016-01-14 NOTE — Telephone Encounter (Signed)
Patient scheduled to see Dr. Annamaria Boots per Amparo Bristol. Nothing further needed.

## 2016-01-16 ENCOUNTER — Encounter (HOSPITAL_COMMUNITY): Payer: Self-pay | Admitting: Radiology

## 2016-01-16 NOTE — Progress Notes (Signed)
Spoke with patient confirmed echo time, date, and no show/cancellation fee.

## 2016-01-17 ENCOUNTER — Ambulatory Visit (HOSPITAL_COMMUNITY): Payer: Medicare Other

## 2016-01-17 ENCOUNTER — Ambulatory Visit (HOSPITAL_COMMUNITY): Admission: RE | Admit: 2016-01-17 | Payer: Medicare Other | Source: Ambulatory Visit

## 2016-01-20 ENCOUNTER — Other Ambulatory Visit (HOSPITAL_COMMUNITY): Payer: Medicare Other

## 2016-01-22 ENCOUNTER — Inpatient Hospital Stay (HOSPITAL_COMMUNITY): Admission: RE | Admit: 2016-01-22 | Payer: Medicare Other | Source: Ambulatory Visit

## 2016-01-23 ENCOUNTER — Ambulatory Visit (HOSPITAL_COMMUNITY): Admission: RE | Admit: 2016-01-23 | Payer: Medicare Other | Source: Ambulatory Visit

## 2016-01-31 ENCOUNTER — Ambulatory Visit (HOSPITAL_COMMUNITY)
Admission: RE | Admit: 2016-01-31 | Discharge: 2016-01-31 | Disposition: A | Discharge status: Home / Self Care | Payer: Medicare Other | Source: Ambulatory Visit | Attending: Primary Care | Admitting: Primary Care

## 2016-01-31 ENCOUNTER — Ambulatory Visit (HOSPITAL_COMMUNITY)
Admission: RE | Admit: 2016-01-31 | Discharge: 2016-01-31 | Disposition: A | Discharge status: Home / Self Care | Payer: Medicare Other | Source: Ambulatory Visit | Attending: Internal Medicine | Admitting: Internal Medicine

## 2016-01-31 DIAGNOSIS — J441 Chronic obstructive pulmonary disease with (acute) exacerbation: Secondary | ICD-10-CM

## 2016-01-31 DIAGNOSIS — R2231 Localized swelling, mass and lump, right upper limb: Secondary | ICD-10-CM | POA: Insufficient documentation

## 2016-01-31 DIAGNOSIS — J984 Other disorders of lung: Secondary | ICD-10-CM | POA: Insufficient documentation

## 2016-01-31 DIAGNOSIS — I700 Atherosclerosis of aorta: Secondary | ICD-10-CM | POA: Diagnosis not present

## 2016-01-31 DIAGNOSIS — K760 Fatty (change of) liver, not elsewhere classified: Secondary | ICD-10-CM | POA: Insufficient documentation

## 2016-01-31 DIAGNOSIS — I2510 Atherosclerotic heart disease of native coronary artery without angina pectoris: Secondary | ICD-10-CM | POA: Diagnosis not present

## 2016-01-31 DIAGNOSIS — J479 Bronchiectasis, uncomplicated: Secondary | ICD-10-CM

## 2016-01-31 HISTORY — PX: OTHER SURGICAL HISTORY: SHX169

## 2016-02-05 ENCOUNTER — Encounter: Payer: Self-pay | Admitting: Internal Medicine

## 2016-02-05 NOTE — Telephone Encounter (Signed)
E-mail from patient today asking for CT results, done on 9.1.17 Response sent to pt informing him that CY has been out of the office all of last week through tomorrow and that message will be forwarded to [facility] address  Dr Annamaria Boots please advise, thank you!

## 2016-02-07 NOTE — Telephone Encounter (Signed)
LMTCB for pt to review results and recommendations

## 2016-02-07 NOTE — Telephone Encounter (Signed)
Ct chest shows some dilated distal airways, which in My opinion, together with his extensive clinical history of chronic productive cough, indicate there is bronchiectasis present. CT also shows atherosclerosis and calcification in the aortic valve of his heart, which his cardiologist manages.  Please order Pneumatic Vest for dx of chronic bronchitis and bronchiectasis with impaired secretion compliance.

## 2016-02-10 ENCOUNTER — Telehealth: Payer: Self-pay | Admitting: Internal Medicine

## 2016-02-10 DIAGNOSIS — J479 Bronchiectasis, uncomplicated: Secondary | ICD-10-CM

## 2016-02-10 DIAGNOSIS — J42 Unspecified chronic bronchitis: Secondary | ICD-10-CM

## 2016-02-10 NOTE — Telephone Encounter (Signed)
Pt calling for results of CT scan 01/31/2016. Order placed for Vest. Expressed understanding. Nothing further needed.  Deneise Lever, MD  4:40PM Note    Ct chest shows some dilated distal airways, which in My opinion, together with his extensive clinical history of chronic productive cough, indicate there is bronchiectasis present. CT also shows atherosclerosis and calcification in the aortic valve of his heart, which his cardiologist manages.  Please order Pneumatic Vest for dx of chronic bronchitis and bronchiectasis with impaired secretion compliance.

## 2016-02-11 ENCOUNTER — Telehealth: Payer: Self-pay | Admitting: Internal Medicine

## 2016-02-11 NOTE — Telephone Encounter (Signed)
[facility] with fax nbr for medical release letter to be sent.   Forwarded message to [facility] regarding vest.

## 2016-02-11 NOTE — Telephone Encounter (Signed)
Patient is cleared for necessary dental work  Our PCCs already working on [employer]. Please ask their help redirecting request to [place]. [name] may not need [place] for that- I'm not sure.

## 2016-02-11 NOTE — Telephone Encounter (Signed)
Spoke with pt and he states that he can get vest from [place] with an order. He would also like to get clearance to have dental work done.   Dr Colletta Maryland Borum @ [facility] Ph [phone]  Dental Clearance Dr Hardin Negus Ph [phone] [place] - Please advise. Thanks!    Allergies  Allergen Reactions  . Ivp Dye [Iodinated Diagnostic Agents]     Omni - Paque Contrast   . Oxycodone Hcl Shortness Of Breath and Rash  . Atorvastatin     REACTION: myalgias  . Bystolic [Nebivolol Hcl]     Nightmares, flashbacks  . Codeine     rash  . Doxycycline     Rash, SOB  . Iohexol      Desc: hives,neck and torso erythemia   . Levaquin [Levofloxacin]     Itching, welps  . Methocarbamol     rash  . Pentazocine Lactate     rash  . Succinylcholine Chloride Other (See Comments)    unknown  . Synvisc [Hylan G-F 20]     rash  . Butorphanol Tartrate Rash  . Dilaudid [Hydromorphone Hcl] Nausea Only and Rash    Aggressive     Current Outpatient Prescriptions on File Prior to Visit  Medication Sig Dispense Refill  . albuterol (PROVENTIL HFA) 108 (90 BASE) MCG/ACT inhaler Inhale 2 puffs into the lungs every 4 (four) hours as needed. For wheezing and/or shortness of breath 3 Inhaler 4  . albuterol (PROVENTIL) (2.5 MG/3ML) 0.083% nebulizer solution Take 3 mLs (2.5 mg total) by nebulization every 6 (six) hours as needed for wheezing or shortness of breath. 75 mL 11  . ALPRAZolam (XANAX) 1 MG tablet Take 1 tablet (1 mg total) by mouth 3 (three) times daily as needed for anxiety or sleep. 90 tablet 0  . aspirin 81 MG chewable tablet Chew 81 mg by mouth daily.    . budesonide-formoterol (SYMBICORT) 160-4.5 MCG/ACT inhaler 2 puffs then rinse mouth well, twice daily 3 Inhaler 3  . carvedilol (COREG) 25 MG tablet TAKE 1 TABLET (25 MG TOTAL) BY MOUTH TWICE DAILY. 180 tablet 0  . clarithromycin (BIAXIN) 500 MG tablet Take 1 tablet (500 mg total) by mouth 2 (two) times daily. 20 tablet 0  .  hydrALAZINE (APRESOLINE) 25 MG tablet Take 1 tablet (25 mg total) by mouth as needed. 30 tablet 3  . hyoscyamine (LEVBID) 0.375 MG 12 hr tablet Take 1 tablet (0.375 mg total) by mouth as needed. For stomach cramps 60 tablet 3  . irbesartan-hydrochlorothiazide (AVALIDE) 300-12.5 MG tablet TAKE 1 TABLET BY MOUTH DAILY. 30 tablet 5  . lubiprostone (AMITIZA) 24 MCG capsule Take 1 capsule (24 mcg total) by mouth 2 (two) times daily with a meal.    . morphine (MSIR) 30 MG tablet Take 1 tablet (30 mg total) by mouth every 4 (four) hours as needed. FOR PAIN 120 tablet 0  . Multiple Vitamins-Minerals (CENTRUM SILVER PO) Take 1 capsule by mouth daily.    . nitroGLYCERIN (NITROSTAT) 0.4 MG SL tablet Place 0.4 mg under the tongue every 5 (five) minutes as needed for chest pain.    [name] Glycol-Propyl Glycol (SYSTANE) 0.4-0.3 % SOLN Apply 1 drop to eye 2 (two) times daily as needed.    . promethazine (PHENERGAN) 25 MG tablet Take 1 tablet (25 mg total) by mouth every 6 (six) hours as needed. FOR NAUSEA 90 tablet 0  . Respiratory Therapy Supplies (FLUTTER) DEVI Use as directed 1 each 0  .  tiotropium (SPIRIVA) 18 MCG inhalation capsule Place 1 capsule (18 mcg total) into inhaler and inhale daily. 90 capsule 4   No current facility-administered medications on file prior to visit.

## 2016-02-12 NOTE — Telephone Encounter (Signed)
I called [facility] Scherrie Bateman [facility] to get the fax number to fax patient's vest order.  I got a VM and left my name and phone number asking them to call me back.

## 2016-02-12 NOTE — Telephone Encounter (Signed)
[facility] @ [facility] called with fax nbr [phone] for medical release letter. Letter printed and given to CY to sign.   Letter signed and faxed to dental clinic.

## 2016-02-13 ENCOUNTER — Other Ambulatory Visit: Payer: Self-pay | Admitting: Internal Medicine

## 2016-02-13 ENCOUNTER — Telehealth: Payer: Self-pay | Admitting: Cardiology

## 2016-02-13 DIAGNOSIS — J42 Unspecified chronic bronchitis: Secondary | ICD-10-CM

## 2016-02-13 DIAGNOSIS — I6523 Occlusion and stenosis of bilateral carotid arteries: Secondary | ICD-10-CM

## 2016-02-13 DIAGNOSIS — J479 Bronchiectasis, uncomplicated: Secondary | ICD-10-CM

## 2016-02-13 DIAGNOSIS — R0600 Dyspnea, unspecified: Secondary | ICD-10-CM

## 2016-02-13 DIAGNOSIS — E785 Hyperlipidemia, unspecified: Secondary | ICD-10-CM

## 2016-02-13 DIAGNOSIS — I10 Essential (primary) hypertension: Secondary | ICD-10-CM

## 2016-02-13 DIAGNOSIS — R0609 Other forms of dyspnea: Secondary | ICD-10-CM

## 2016-02-13 MED ORDER — NITROGLYCERIN 0.4 MG SL SUBL: 0.4000 mg | SUBLINGUAL_TABLET | SUBLINGUAL | 99 refills | Status: DC | PRN

## 2016-02-13 NOTE — Telephone Encounter (Signed)
So I don't think these episodes are probably anginal related because usually nitroglycerin with relieve anginal chest pain within about a minute. We can refill of nitroglycerin however if he would like.  I'm not really surprised about the diagnosis of bronchiectasis. Could be a source of some of his chest discomfort.  However, if the symptoms continue to persist, we probably would be inclined to do some type of ischemic evaluation.  Lets have him come in to see an APP if Sx persist - can get a ST ordered.  [name], MD

## 2016-02-13 NOTE — Telephone Encounter (Addendum)
Returned call to patient- patient reports he needs a new order for carotid dopplers because the order cannot be >2 months old per insurance.  New order placed and sent to scheduling to make appt.  Also reports he has recently been diagnosed with bronchiectasis and is seeing a pulmonologist.    Pt also reports recent episodes of left sided CP radiating to his left neck, occurring at rest and exertion.  Reports he initially thought it was d/t him coughing but has been having to take NTG, reports taking 2 NTG yesterday resolving the pain in 15-45 mins.  Pt is retired EMT and reports he has no N/V/diaphoresis and is not sure what to contribute the pain to considering his current lung issues and cardiac issues.  Pt does report +productive cough with blood tinged sputum.  Denies CP at current.   Advised if episodes continue or increase to proceed to ER for evaluation-pt verbalized understanding.    Advised he may need to be seen by APP soon, will route to MD [facility] for further recommendations and to make aware of situation.    Also requesting NTG refill-rx sent to pharmacy.

## 2016-02-13 NOTE — Telephone Encounter (Signed)
Spoke to patient-made aware of MD [facility] recommendations.  Verbalized understanding. Will call if s/sx persist.

## 2016-02-13 NOTE — Telephone Encounter (Signed)
New message      Pt is calling to resc carotid ultrasound.  The order has expired.  Please call pt and let him know if the doctor still want him to have this test.  Echo was resc to 02-27-16.

## 2016-02-18 ENCOUNTER — Ambulatory Visit (HOSPITAL_COMMUNITY)
Admission: RE | Admit: 2016-02-18 | Discharge: 2016-02-18 | Disposition: A | Discharge status: Home / Self Care | Payer: Medicare Other | Source: Ambulatory Visit | Attending: Cardiology | Admitting: Cardiology

## 2016-02-18 ENCOUNTER — Telehealth: Payer: Self-pay | Admitting: *Deleted

## 2016-02-18 DIAGNOSIS — R0609 Other forms of dyspnea: Secondary | ICD-10-CM | POA: Insufficient documentation

## 2016-02-18 DIAGNOSIS — I6523 Occlusion and stenosis of bilateral carotid arteries: Secondary | ICD-10-CM

## 2016-02-18 DIAGNOSIS — R0600 Dyspnea, unspecified: Secondary | ICD-10-CM

## 2016-02-18 DIAGNOSIS — I10 Essential (primary) hypertension: Secondary | ICD-10-CM | POA: Diagnosis present

## 2016-02-18 DIAGNOSIS — E785 Hyperlipidemia, unspecified: Secondary | ICD-10-CM

## 2016-02-18 NOTE — Telephone Encounter (Signed)
Pt requested to speak with RN after completing vascular study.  Pt reports symptoms discussed on 9/14 (see telephone note) have continued.  Reports having to take NTG to relieve chest discomfort (3 total yesterday) and reports diaphoresis, weakness and nausea associated.  Denies chest pain at current.  Advised that MD Ellyn Hack recommended him being seen if this continued, therefore would try to make him an appt and call him today.  Advised if s/sx continue or increase to go to ER for evaluation.    Appt made for 9/21 with Ignacia Bayley NP  @ 11AM at [facility] location.  Called and spoke to wife (ok per DPR).  Aware and verbalized understanding.

## 2016-02-19 ENCOUNTER — Encounter: Payer: Self-pay | Admitting: Internal Medicine

## 2016-02-20 ENCOUNTER — Telehealth: Payer: Self-pay | Admitting: *Deleted

## 2016-02-20 ENCOUNTER — Ambulatory Visit: Payer: Medicare Other | Admitting: Nurse Practitioner

## 2016-02-20 DIAGNOSIS — I739 Peripheral vascular disease, unspecified: Principal | ICD-10-CM

## 2016-02-20 DIAGNOSIS — I779 Disorder of arteries and arterioles, unspecified: Secondary | ICD-10-CM | POA: Insufficient documentation

## 2016-02-20 NOTE — Telephone Encounter (Signed)
-----   Message from Leonie Man, MD sent at 02/19/2016 12:53 PM EDT ----- Carotid Dopplers look pretty stable. Right internal carotid has slight progression from less than 40% to now 40-59%. Otherwise stable carotids, subclavian central vertebrals.  Follow-up follow-up in one year  [name], MD

## 2016-02-20 NOTE — Telephone Encounter (Signed)
Spoke to patient. Result given . Verbalized understanding Order placed 12 month check

## 2016-02-20 NOTE — Telephone Encounter (Signed)
My Chart does not release images directly to patient, because the files are too big. Just the reports get released. We can arrange to look at the images here at the office if needed.

## 2016-02-24 ENCOUNTER — Other Ambulatory Visit: Payer: Self-pay | Admitting: Cardiology

## 2016-02-24 NOTE — Telephone Encounter (Signed)
Rx request sent to pharmacy.  

## 2016-02-27 ENCOUNTER — Other Ambulatory Visit (HOSPITAL_COMMUNITY): Payer: Medicare Other

## 2016-03-03 ENCOUNTER — Ambulatory Visit: Payer: Medicare Other | Admitting: Nurse Practitioner

## 2016-03-11 ENCOUNTER — Encounter: Payer: Medicare Other | Admitting: Physician Assistant

## 2016-03-11 ENCOUNTER — Telehealth: Payer: Self-pay | Admitting: Cardiology

## 2016-03-11 ENCOUNTER — Other Ambulatory Visit (HOSPITAL_COMMUNITY): Payer: Medicare Other

## 2016-03-11 ENCOUNTER — Telehealth: Payer: Self-pay | Admitting: Internal Medicine

## 2016-03-11 NOTE — Telephone Encounter (Signed)
To qualify through standard insurance for a pneumatic VEST, I am allowed and did document that my clinical impression is that he has bronchiectasis, even though the radiologist didn't call it on his reading.  I do believe this therapy can do a better job of helping him clear secretions from his airaways.
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
## 2016-03-11 NOTE — Telephone Encounter (Signed)
Called and spoke with pt and he is aware of CY recs.  Nothing further is needed.  

## 2016-03-11 NOTE — Telephone Encounter (Signed)
Called spoke with patient who is concerned about his Vest diagnosis of bronchiectasis.  He is getting ready to go the [place] now.  He can be reached on his cell:    Telephone Information:  Mobile [phone]   Per the impression from his 9.1.17 CT Chest: IMPRESSION: 1. No bronchiectasis identified. 2. Small focus of what appears to be post infectious/inflammatory scarring in the left lower lobe, new compared to the prior examination. 3. Multiple benign appearing sub cm pulmonary nodules appear unchanged compared to the prior examination from 2010 and are considered benign. Some of these are calcified granulomas. 4. Aortic atherosclerosis, in addition to left main and 2 vessel coronary artery disease. Please note that although the presence of coronary artery calcium documents the presence of coronary artery disease, the severity of this disease and any potential stenosis cannot be assessed on this non-gated CT examination. Assessment for potential risk factor modification, dietary therapy or pharmacologic therapy may be warranted, if clinically indicated. 5. There are calcifications of the aortic valve. Echocardiographic correlation for evaluation of potential valvular dysfunction may be warranted if clinically indicated. 6. Hepatic steatosis.   CY please advise, thank you

## 2016-03-12 NOTE — Progress Notes (Signed)
This encounter was created in error - please disregard.

## 2016-03-24 ENCOUNTER — Other Ambulatory Visit: Payer: Self-pay

## 2016-03-24 ENCOUNTER — Ambulatory Visit (HOSPITAL_COMMUNITY): Payer: Medicare Other | Attending: Cardiology

## 2016-03-24 DIAGNOSIS — I509 Heart failure, unspecified: Secondary | ICD-10-CM | POA: Insufficient documentation

## 2016-03-24 DIAGNOSIS — Z8673 Personal history of transient ischemic attack (TIA), and cerebral infarction without residual deficits: Secondary | ICD-10-CM | POA: Insufficient documentation

## 2016-03-24 DIAGNOSIS — I110 Hypertensive heart disease with heart failure: Secondary | ICD-10-CM | POA: Diagnosis not present

## 2016-03-24 DIAGNOSIS — I10 Essential (primary) hypertension: Secondary | ICD-10-CM

## 2016-03-24 DIAGNOSIS — E785 Hyperlipidemia, unspecified: Secondary | ICD-10-CM | POA: Insufficient documentation

## 2016-03-24 DIAGNOSIS — J449 Chronic obstructive pulmonary disease, unspecified: Secondary | ICD-10-CM | POA: Insufficient documentation

## 2016-03-24 DIAGNOSIS — E119 Type 2 diabetes mellitus without complications: Secondary | ICD-10-CM | POA: Diagnosis not present

## 2016-03-24 DIAGNOSIS — I350 Nonrheumatic aortic (valve) stenosis: Secondary | ICD-10-CM | POA: Diagnosis not present

## 2016-03-24 LAB — ECHOCARDIOGRAM COMPLETE
AOPV: 0.35 m/s
AOVTI: 58.3 cm
AV Area VTI index: 0.92 cm2/m2
AV Area mean vel: 1.54 cm2
AV Peak grad: 30 mmHg
AV VEL mean LVOT/AV: 0.37
AV pk vel: 273 cm/s
AVAREAMEANVIN: 0.66 cm2/m2
AVAREAVTI: 1.46 cm2
AVG: 17 mmHg
CHL CUP AV PEAK INDEX: 0.63
CHL CUP AV VALUE AREA INDEX: 0.92
CHL CUP AV VEL: 2.14
DOP CAL AO MEAN VELOCITY: 189 cm/s
E decel time: 278 msec
E/e' ratio: 14.53
FS: 46 % — AB (ref 28–44)
IV/PV OW: 0.98
LA diam end sys: 39 mm
LA vol: 76 mL
LADIAMINDEX: 1.68 cm/m2
LASIZE: 39 mm
LAVOLA4C: 63 mL
LAVOLIN: 32.8 mL/m2
LDCA: 4.15 cm2
LVEEAVG: 14.53
LVEEMED: 14.53
LVELAT: 5.81 cm/s
LVOT SV: 125 mL
LVOT VTI: 30 cm
LVOT diameter: 23 mm
LVOT peak vel: 96.3 cm/s
LVOTVTI: 0.51 cm
MV Dec: 278
MV Peak grad: 3 mmHg
MV pk A vel: 109 m/s
MVPKEVEL: 84.4 m/s
P 1/2 time: 675 ms
PW: 12 mm — AB (ref 0.6–1.1)
TDI e' lateral: 5.81
TDI e' medial: 4.72
Valve area: 2.14 cm2

## 2016-03-25 ENCOUNTER — Encounter: Payer: Self-pay | Admitting: Cardiology

## 2016-03-25 ENCOUNTER — Telehealth: Payer: Self-pay | Admitting: Cardiology

## 2016-03-25 NOTE — Telephone Encounter (Signed)
We can discuss was seen in follow-up. Otherwise I can renew his nitroglycerin prescription.

## 2016-03-25 NOTE — Telephone Encounter (Signed)
New message    Pt verbalized that he is calling for the results of the echo

## 2016-03-25 NOTE — Telephone Encounter (Signed)
Follow up   He also wants to be checked sooner he took 03/30/16  Pt c/o of Chest Pain: STAT if CP now or developed within 24 hours  1. Are you having CP right now? He took Nitroglycerin  2. Are you experiencing any other symptoms (ex. SOB, nausea, vomiting, sweating)? sob  3. How long have you been experiencing CP?  2 weeks 4. Is your CP continuous or coming and going? Coming and going   5. Have you taken Nitroglycerin? yes ?
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
## 2016-03-26 ENCOUNTER — Encounter: Payer: Self-pay | Admitting: *Deleted

## 2016-03-30 ENCOUNTER — Ambulatory Visit (INDEPENDENT_AMBULATORY_CARE_PROVIDER_SITE_OTHER): Payer: Medicare Other | Admitting: Cardiology

## 2016-03-30 VITALS — BP 152/78 | HR 81 | Ht 74.0 in | Wt 250.4 lb

## 2016-03-30 DIAGNOSIS — R0609 Other forms of dyspnea: Secondary | ICD-10-CM | POA: Diagnosis not present

## 2016-03-30 DIAGNOSIS — I10 Essential (primary) hypertension: Secondary | ICD-10-CM | POA: Diagnosis not present

## 2016-03-30 DIAGNOSIS — R079 Chest pain, unspecified: Secondary | ICD-10-CM

## 2016-03-30 DIAGNOSIS — R600 Localized edema: Secondary | ICD-10-CM

## 2016-03-30 DIAGNOSIS — Z8249 Family history of ischemic heart disease and other diseases of the circulatory system: Secondary | ICD-10-CM

## 2016-03-30 DIAGNOSIS — I350 Nonrheumatic aortic (valve) stenosis: Secondary | ICD-10-CM

## 2016-03-30 DIAGNOSIS — R0600 Dyspnea, unspecified: Secondary | ICD-10-CM

## 2016-03-30 MED ORDER — FUROSEMIDE 20 MG PO TABS: 20.0000 mg | ORAL_TABLET | Freq: Every day | ORAL | 3 refills | Status: DC

## 2016-03-30 MED ORDER — ISOSORBIDE MONONITRATE ER 30 MG PO TB24: 30.0000 mg | ORAL_TABLET | Freq: Every day | ORAL | 3 refills | Status: DC

## 2016-03-30 MED ORDER — CARVEDILOL 25 MG PO TABS: ORAL_TABLET | ORAL | 3 refills | Status: DC

## 2016-03-30 MED ORDER — HYDRALAZINE HCL 25 MG PO TABS: 25.0000 mg | ORAL_TABLET | Freq: Two times a day (BID) | ORAL | 3 refills | Status: DC

## 2016-03-30 NOTE — Patient Instructions (Signed)
Medication Instructions:  INCREASE HYDRALAZINE 25 MG TWICE DAILY START ISOSORBIDE MONO 30 MG IN THE MORNING WITH HYDRALAIZINE 25 MG  START FUROSEMIDE 20 MG 1 TABLET DAILY AND MAY TAKE ANOTHER ADDITIONAL TABLET FOR SWELLING OR SHORTNESS OF BREATH  Labwork: NONE  Follow-Up: Your physician recommends that you schedule a follow-up appointment in: 6-8 WEEKS WITH DR [facility]   Any Other Special Instructions Will Be Listed Below (If Applicable). IF TAKING  NITRO MULTIPLE TIMES CALL OFFICE, WILL SCHEDULE A LEFT AND RIGHT CATHETERIZATION    If you need a refill on your cardiac medications before your next appointment, please call your pharmacy.

## 2016-03-30 NOTE — Progress Notes (Signed)
PCP: Ria Bush, MD  Clinic Note: Chief Complaint  Patient presents with  . Chest Pain  . Shortness of Breath    Recent diagnosis of bronchiectasis    HPI: [name] is a 65 y.o. male with a PMH below who presents today for Three-month follow-up mild aortic stenosis and other cardiac complaints. He is a [place] Veteran History of Agent Orange Exposure he has had multiple wound was; redo [place] surgery. Major issues have been revolving around bowel obstructions. He has had a normal cardiac catheterization as well as a negative Myoview in the past. Cardiac catheterization was in June 2013 - normal coronaries   [name] was last seen on 12/16/2015: There was a question of a possible TIA prior to visit. He noted intermittent episodes of musculoskeletal chest discomfort that he describes as short-lived twinges. He was hypertensive in the abdomen hydralazine  Recent Hospitalizations: none  Studies Reviewed:   Transthoracic Echo 03/24/2016: Normal LV function with EF 60-65% no regional wall motion abnormalities. GR 1 DD. Mild aortic stenosis (mean gradient 17 mmHg) - doubt now appears to be mildly stenotic as opposed to just sclerotic  Carotid Dopplers November 2017:  pretty stable. Right internal carotid has slight progression from less than 40% to now 40-59%. Otherwise stable carotids, subclavian central vertebrals.  Chest CT that showed bronchiectasis Per his pulmonologist. Not on the actual radiology read  Interval History: [name] presents today following telephone calls over the weekend about chest pain. He notes that he take occasional nitroglycerin. He is currently again dealing with a partial small bowel obstruction and notes increased abdominal girth. He is fixated now on diagnosis that was just given him a bronchiectasis. He just had a bout of bronchitis. He states that he'll have productive phlegm of white mucousy phlegm after using his inhaler. He is  scheduled to get a chest vibrator to help.   he notes decreased as asleep up in a chair because he gets short of breath otherwise. Does not necessarily wake up short of breath, simply has a hard time going to sleep. As a result. (Orthopnea without PND. Mild edema). He also is noted some of his typical sharp chest discomfort spells but is also had some exertional tightness in his chest. This is occurring with more frequency and may be relieved with nitroglycerin. He doesn't always use it.  No palpitations, or syncope/near syncope but he does have somewhat secondary dizziness type symptoms and unsteady gait. -  No TIA/amaurosis fugax symptoms. No claudication.  ROS: A comprehensive was performed. Review of Systems  Constitutional: Positive for malaise/fatigue. Negative for weight loss (He actually seems like he is gained weight.).  HENT: Negative for congestion and nosebleeds.   Respiratory: Positive for shortness of breath.   Cardiovascular: Positive for chest pain, orthopnea and leg swelling (Mild).  Gastrointestinal: Positive for abdominal pain (Partial SBO), constipation (Had a BM this morning, but has been difficult), heartburn and nausea. Negative for blood in stool and melena.  Genitourinary: Negative for hematuria.  Musculoskeletal: Positive for back pain and joint pain. Negative for falls.  Skin: Positive for rash (On his arms. Being evaluated by the [facility].).  Neurological: Positive for dizziness (Positional) and headaches. Negative for focal weakness, seizures and loss of consciousness.  Psychiatric/Behavioral: Positive for memory loss. Negative for depression. The patient is not nervous/anxious and does not have insomnia.   All other systems reviewed and are negative.   Past Medical History:  Diagnosis Date  . Anemia   .  Asthma   . Back pain, chronic, followed at pain clinic 11/25/2011  . Carotid artery stenosis 09/06/2014  . Chest pain with low risk for cardiac etiology 10/2011    Non-obstructive CAD by Cath; negative Lexiscan in 08/2011  . Clotting disorder (HCC)    LLE DVT (also small vessel)  . Colon polyps   . COPD (chronic obstructive pulmonary disease) ([place])   . Dementia    Mild  . Depression   . Dyslipidemia (high LDL; low HDL)    statin intolerant; on fibrate  . Edema leg    Wears compression stockings; 08/2012 dopplers - no DVT or thrombophlebitis; mild R Popliteal V reflux - no VNUS ablation candidate  . Fainting   . Family history of acute myocardial infarction. and premature CAD 11/25/2011  . GERD (gastroesophageal reflux disease)   . Hearing loss   . History of diabetes mellitus   . Hypertension    very labile  . Mild aortic stenosis by prior echocardiogram 03/2012   moderate sclerosis  . Nephrolithiasis   . Osteoarthritis   . Other idiopathic peripheral autonomic neuropathy    Agent orange  . PTSD (post-traumatic stress disorder) 07/24/2015  . Seizure ([place])   . Small bowel obstruction 1990s, 2001, 2015   s/p multiple bowel surgeries; from war wounds  . Stroke ([place])   . Weakness     Past Surgical History:  Procedure Laterality Date  . APPENDECTOMY  1958   per patient  . CARDIOVASCULAR STRESS TEST  08/2011   Negative lexiscan myoview  . Carotid Dopplers  01/2016   Stable. RICA - slight progression from<40% to 40-59%.  Otherwise stable bilateral carotids and subclavian arteries. Stable LICA  . COLONOSCOPY    . exploratory laparotomy with extensive lysis of adhesions  10/1999  . JOINT REPLACEMENT     right knee replaced 2x  . LEFT HEART CATHETERIZATION WITH CORONARY ANGIOGRAM N/A 11/26/2011   WNL Lorretta Harp, MD  . small bowel obstruction  1996, 1999, 2001, 2015  . TONSILLECTOMY  1960   per patient  . TOTAL KNEE ARTHROPLASTY    . TRANSTHORACIC ECHOCARDIOGRAM  03/24/2016   Mild LVH. EF 60-65%. GR 1 DD. Severely restricted calcified noncoronary cusp of the aortic valve. Mild aortic stenosis (mean gradient 17 mmHg) with mild  regurgitation.  . TRANSTHORACIC ECHOCARDIOGRAM  2012; 03/2012   a. Essentially normal wirth moderate concentric LVH and moderate aortic sclerosis; b. Mild concentric LVH. EF greater than 55%. Mild aortic stenosis. Grade 1 diastolic dysfunction    Prior to Admission medications   Medication Sig Start Date Taking? Authorizing Provider  albuterol (PROVENTIL HFA) 108 (90 BASE) MCG/ACT inhaler Inhale 2 puffs into the lungs every 4 (four) hours as needed. For wheezing and/or shortness of breath 10/09/14 Yes Deneise Lever, MD  albuterol (PROVENTIL) (2.5 MG/3ML) 0.083% nebulizer solution Take 3 mLs (2.5 mg total) by nebulization every 6 (six) hours as needed for wheezing or shortness of breath. 01/10/15 Yes Deneise Lever, MD  ALPRAZolam Duanne Moron) 1 MG tablet Take 1 tablet (1 mg total) by mouth 3 (three) times daily as needed for anxiety or sleep. 01/22/14 Yes Theodis Blaze, MD  aspirin 81 MG chewable tablet Chew 81 mg by mouth daily.  Yes Historical Provider, MD  budesonide-formoterol [facility]) 160-4.5 MCG/ACT inhaler 2 puffs then rinse mouth well, twice daily 10/31/15 Yes Clinton D Young, MD  carvedilol (COREG) 25 MG tablet TAKE 1 TABLET (25 MG TOTAL) BY MOUTH TWICE DAILY. 08/14/15 Yes Shanon Brow  Loren Racer, MD  clarithromycin (BIAXIN) 500 MG tablet Take 1 tablet (500 mg total) by mouth 2 (two) times daily. 01/13/16 Yes Deneise Lever, MD  hydrALAZINE (APRESOLINE) 25 MG tablet Take 1 tablet (25 mg total) by mouth as needed. 12/16/15 Yes Leonie Man, MD  hyoscyamine (LEVBID) 0.375 MG 12 hr tablet Take 1 tablet (0.375 mg total) by mouth as needed. For stomach cramps 03/28/14 Yes Susy Frizzle, MD  irbesartan-hydrochlorothiazide (AVALIDE) 300-12.5 MG tablet TAKE 1 TABLET BY MOUTH DAILY. 02/24/16 Yes Leonie Man, MD  morphine (MSIR) 30 MG tablet Take 1 tablet (30 mg total) by mouth every 4 (four) hours as needed. FOR PAIN 01/22/14 Yes Theodis Blaze, MD  Multiple Vitamins-Minerals (CENTRUM SILVER PO) Take 1  capsule by mouth daily.  Yes Historical Provider, MD  nitroGLYCERIN (NITROSTAT) 0.4 MG SL tablet Place 1 tablet (0.4 mg total) under the tongue every 5 (five) minutes as needed for chest pain. 02/13/16 Yes Leonie Man, MD  Polyethyl Glycol-Propyl Glycol (SYSTANE) 0.4-0.3 % SOLN Apply 1 drop to eye 2 (two) times daily as needed.  Yes Historical Provider, MD  promethazine (PHENERGAN) 25 MG tablet Take 1 tablet (25 mg total) by mouth every 6 (six) hours as needed. FOR NAUSEA 01/22/14 Yes Theodis Blaze, MD  Respiratory Therapy Supplies (FLUTTER) DEVI Use as directed 01/10/15 Yes Deneise Lever, MD  tiotropium (SPIRIVA) 18 MCG inhalation capsule Place 1 capsule (18 mcg total) into inhaler and inhale daily. 10/09/14 Yes Deneise Lever, MD    Allergies  Allergen Reactions  . Ivp Dye [Iodinated Diagnostic Agents]     Omni - Paque Contrast   . Oxycodone Hcl Shortness Of Breath and Rash  . Atorvastatin     REACTION: myalgias  . Bystolic [Nebivolol Hcl]     Nightmares, flashbacks  . Codeine     rash  . Doxycycline     Rash, SOB  . Iohexol      Desc: hives,neck and torso erythemia   . Levaquin [Levofloxacin]     Itching, welps  . Methocarbamol     rash  . Pentazocine Lactate     rash  . Succinylcholine Chloride Other (See Comments)    unknown  . Synvisc [Hylan G-F 20]     rash  . Butorphanol Tartrate Rash  . Dilaudid [Hydromorphone Hcl] Nausea Only and Rash    Aggressive     Social History   Social History  . Marital status: Married    Spouse name: N/A  . Number of children: 3  . Years of education: N/A   Occupational History  . Disabled    Social History Main Topics  . Smoking status: Never Smoker  . Smokeless tobacco: Never Used  . Alcohol use No  . Drug use: No  . Sexual activity: Yes   Other Topics Concern  . None   Social History Narrative   He is a father of 47, grandfather 70.    One of his sons was killed during the war in [place]. Grandson killed bike accident  2017.   He was exposed to [employer].   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   Not very physically active due to extreme disability/debilitation from osteoarthritis and peripheral neuropathy.   Family History family history includes Heart attack in his brother, father, mother, sister, and son; Heart disease in his father and mother; Hypertension in his brother and brother;  Non-Hodgkin's lymphoma in his daughter; Sleep apnea in his son; Stroke in his mother and son.  Wt Readings from Last 3 Encounters:  03/30/16 113.6 kg (250 lb 6.4 oz)  01/13/16 108.5 kg (239 lb 3.2 oz)  12/16/15 110.7 kg (244 lb)    PHYSICAL EXAM BP (!) 152/78   Pulse 81   Ht 6\' 2"  (1.88 m)   Wt 113.6 kg (250 lb 6.4 oz)   BMI 32.15 kg/m  General appearance: alert, cooperative, appears stated age, no distress and Mildly obese. He is wearing a mask because of his recent bronchitis Neck: no adenopathy, no carotid bruit and no JVD Lungs: clear to auscultation bilaterally with the exception of mild right-sided crackles (not rales), normal percussion bilaterally and non-labored Heart: regular rate and rhythm, S1, S2 normal, no S3 or S4; 2/6, harsh crescendo- decrescendo SEM @ 2nd right intercostal space, radiates to carotids, no click and no rub Abdomen: Soft, mildly distended and tender to palpation. Hollow/tympanitic. There are increased bowel sounds Extremities: extremities normal, atraumatic, no cyanosis, and edema trace Pulses: 2+ and symmetric;  Neurologic: Mental status: Alert, oriented, thought content appropriate Cranial nerves: normal (II-XII grossly intact)   Adult ECG Report  Rate: 81 ;  Rhythm: normal sinus rhythm and Normal axis, intervals and durations.;   Narrative Interpretation: Essentially normal EKG    Other studies Reviewed: Additional studies/ records that were reviewed today include:  Recent Labs:   Lab Results  Component  Value Date   CHOL 207 (H) 07/23/2015   HDL 37.70 (L) 07/23/2015   LDLCALC 133 (H) 07/23/2015   LDLDIRECT 66.4 02/02/2007   TRIG 181.0 (H) 07/23/2015   CHOLHDL 5 07/23/2015   Lab Results  Component Value Date   CREATININE 0.77 07/23/2015   CREATININE 0.65 02/12/2014   CREATININE 0.76 01/21/2014     ASSESSMENT / PLAN: Problem List Items Addressed This Visit    Mild aortic stenosis by prior echocardiogram (Chronic)    Mild progression of disease. This was a routine follow-up. Can probably just reassess in 1 year.      Relevant Medications   carvedilol (COREG) 25 MG tablet   hydrALAZINE (APRESOLINE) 25 MG tablet   isosorbide mononitrate (IMDUR) 30 MG 24 hr tablet   furosemide (LASIX) 20 MG tablet   Family history of acute myocardial infarction. and premature CAD (Chronic)   Essential hypertension (Chronic)    Remains hypertensive today. With orthopnea suggesting possible diastolic dysfunction related heart failure, I will increase hydralazine from an as needed dose to 25 mg twice a day. May very well titrate up further. He is already on carvedilol and Avalide.      Relevant Medications   carvedilol (COREG) 25 MG tablet   hydrALAZINE (APRESOLINE) 25 MG tablet   isosorbide mononitrate (IMDUR) 30 MG 24 hr tablet   furosemide (LASIX) 20 MG tablet   Other Relevant Orders   EKG 12-Lead (Completed)   DOE (dyspnea on exertion) (Chronic)    Probably multifactorial. It seems to be a little worse than his baseline. Certainly is recovering from a bronchitis spell. This could be the main cause, however in light of the fact he is also having chest discomfort, cannot necessarily exclude ischemic etiology. Did not have significant elevated filling pressures on echo, only grade 1 diastolic dysfunction; however, this is not the most accurate assessment. Him having orthopnea type symptoms, cannot exclude the presence of CHF from diastolic dysfunction. We are adding Lasix as a standing dose plus  when necessary.  If his exertional dyspnea and chest discomfort persists at follow-up, would have a low threshold for right heart catheterization. This will help [place] determine true right-sided filling pressures, LVEDP and PCWP      Relevant Orders   EKG 12-Lead (Completed)   Chest pain with moderate risk for cardiac etiology - Primary (Chronic)    He continues to have these episodes of chest discomfort. He's had them off and on for years. He had a Myoview in 2013 followed by cardiac catheterization which did not show significant disease. He has significant risk factors, and has had now persistent symptoms.  I would like to wait and see how he feels after he is recovered from his bronchitis episode to ensure that this discomfort is not related to his bronchitis/bronchiectasis.   Plan: Add Imdur, and start Lasix  I will see him back in 6-8 weeks in order to reassess. At that time if he continues to have chest discomfort and exertional dyspnea I would have a low threshold to consider right left heart catheterization. The right heart cath can help me estimate pulmonary pressures given his COPD/bronchiectasis diagnosis, and left heart catheterization with angiography can help exclude coronary disease.      Relevant Orders   EKG 12-Lead (Completed)   Bilateral lower extremity edema (Chronic)    Actually pretty well-controlled. He does wear compression/support stockings, but is not wearing them today. He is not on diuretic.  With his orthopnea symptoms and edema, Will start low-dose standing dose of Lasix and allow for additional doses when necessary for swelling and dyspnea..      Relevant Orders   EKG 12-Lead (Completed)    Other Visit Diagnoses   None.     Current medicines are reviewed at length with the patient today. (+/- concerns) none The following changes have been made: see below  Patient Instructions  Medication Instructions:  INCREASE HYDRALAZINE 25 MG TWICE DAILY START  ISOSORBIDE MONO 30 MG IN THE MORNING WITH HYDRALAIZINE 25 MG  START FUROSEMIDE 20 MG 1 TABLET DAILY AND MAY TAKE ANOTHER ADDITIONAL TABLET FOR SWELLING OR SHORTNESS OF BREATH  Labwork: NONE  Follow-Up: Your physician recommends that you schedule a follow-up appointment in: 6-8 WEEKS WITH DR [facility]   Any Other Special Instructions Will Be Listed Below (If Applicable). IF TAKING  NITRO MULTIPLE TIMES CALL OFFICE, WILL SCHEDULE A LEFT AND RIGHT CATHETERIZATION    If you need a refill on your cardiac medications before your next appointment, please call your pharmacy.   Studies Ordered:   Orders Placed This Encounter  Procedures  . EKG 12-Lead      [name], M.D., M.S. Interventional Cardiologist   Pager # [phone] Phone # [phone] [address] [place], [place] [address]

## 2016-03-31 ENCOUNTER — Encounter: Payer: Self-pay | Admitting: Cardiology

## 2016-03-31 NOTE — Assessment & Plan Note (Signed)
Mild progression of disease. This was a routine follow-up. Can probably just reassess in 1 year.

## 2016-03-31 NOTE — Assessment & Plan Note (Addendum)
He continues to have these episodes of chest discomfort. He's had them off and on for years. He had a Myoview in 2013 followed by cardiac catheterization which did not show significant disease. He has significant risk factors, and has had now persistent symptoms.  I would like to wait and see how he feels after he is recovered from his bronchitis episode to ensure that this discomfort is not related to his bronchitis/bronchiectasis.   Plan: Add Imdur, and start Lasix  I will see him back in 6-8 weeks in order to reassess. At that time if he continues to have chest discomfort and exertional dyspnea I would have a low threshold to consider right left heart catheterization. The right heart cath can help me estimate pulmonary pressures given his COPD/bronchiectasis diagnosis, and left heart catheterization with angiography can help exclude coronary disease.

## 2016-03-31 NOTE — Assessment & Plan Note (Signed)
Remains hypertensive today. With orthopnea suggesting possible diastolic dysfunction related heart failure, I will increase hydralazine from an as needed dose to 25 mg twice a day. May very well titrate up further. He is already on carvedilol and Avalide.

## 2016-03-31 NOTE — Assessment & Plan Note (Addendum)
Probably multifactorial. It seems to be a little worse than his baseline. Certainly is recovering from a bronchitis spell. This could be the main cause, however in light of the fact he is also having chest discomfort, cannot necessarily exclude ischemic etiology. Did not have significant elevated filling pressures on echo, only grade 1 diastolic dysfunction; however, this is not the most accurate assessment. Him having orthopnea type symptoms, cannot exclude the presence of CHF from diastolic dysfunction. We are adding Lasix as a standing dose plus when necessary.  If his exertional dyspnea and chest discomfort persists at follow-up, would have a low threshold for right heart catheterization. This will help [place] determine true right-sided filling pressures, LVEDP and PCWP

## 2016-03-31 NOTE — Assessment & Plan Note (Addendum)
Actually pretty well-controlled. He does wear compression/support stockings, but is not wearing them today. He is not on diuretic.  With his orthopnea symptoms and edema, Will start low-dose standing dose of Lasix and allow for additional doses when necessary for swelling and dyspnea.[name]

## 2016-04-01 ENCOUNTER — Ambulatory Visit: Payer: Medicare Other | Admitting: Cardiology

## 2016-04-07 ENCOUNTER — Telehealth: Payer: Self-pay | Admitting: Internal Medicine

## 2016-04-07 DIAGNOSIS — J42 Unspecified chronic bronchitis: Secondary | ICD-10-CM

## 2016-04-07 DIAGNOSIS — J479 Bronchiectasis, uncomplicated: Secondary | ICD-10-CM

## 2016-04-07 NOTE — Telephone Encounter (Signed)
lmtcb X1 for pt  

## 2016-04-08 NOTE — Telephone Encounter (Signed)
lmtcb x2 for pt. 

## 2016-04-08 NOTE — Telephone Encounter (Signed)
Spoke with pt. He would like to have an order sent to a DME company for a vibratory vest. Pt states that he has been trying get this through the [place] and he is not getting anywhere. Order has been placed. Nothing further was needed.

## 2016-04-08 NOTE — Telephone Encounter (Signed)
Patient returning call - he can be reached at [phone]

## 2016-04-14 ENCOUNTER — Telehealth: Payer: Self-pay | Admitting: Internal Medicine

## 2016-04-14 ENCOUNTER — Telehealth: Payer: Self-pay

## 2016-04-14 NOTE — Telephone Encounter (Signed)
(  Sick Message)  Dr. Annamaria Boots  I spoke with this Pt. And he wanted to inform you that for the past couple of days, he has been hoarse, coughing, coughing up thick yellowish mucus, and has been clearing his throat a lot. He wanted to know if there was something that could be called in.  Please Advise Last OV was 01/13/16

## 2016-04-14 NOTE — Telephone Encounter (Signed)
Suggest cleocin 300 mg # 14, 1 twice daily

## 2016-04-14 NOTE — Telephone Encounter (Signed)
ATC patient 3 times-line busy. Will hold in Triage for morning.

## 2016-04-14 NOTE — Telephone Encounter (Signed)
Called pt. Back and informed him I would reach out to [facility] and find out about his referral. Did speak with Ailene Ravel and she stated that she would have to look further into this referral but does see where it was received. I did give her the pt. Cell number per the patient because that is how he wanted to be reached. She stated that they will call him tomorrow. Nothing further is needed at this moment.

## 2016-04-15 NOTE — Telephone Encounter (Signed)
LMTCB

## 2016-04-16 ENCOUNTER — Telehealth: Payer: Self-pay | Admitting: Internal Medicine

## 2016-04-16 NOTE — Telephone Encounter (Signed)
Patient returning call - please call him at [phone] -pr

## 2016-04-16 NOTE — Telephone Encounter (Signed)
Deneise Lever, MD  to [facility] Triage Pool     4:46 PM  Note    Suggest cleocin 300 mg # 14, 1 twice daily       LMTCB-see previous note regarding abx to be given.

## 2016-04-16 NOTE — Telephone Encounter (Signed)
See 04/14/16 phone.  Lmctb. Will await call back.

## 2016-04-17 ENCOUNTER — Telehealth: Payer: Self-pay | Admitting: *Deleted

## 2016-04-17 ENCOUNTER — Telehealth: Payer: Self-pay

## 2016-04-17 MED ORDER — CLINDAMYCIN HCL 300 MG PO CAPS: 300.0000 mg | ORAL_CAPSULE | Freq: Two times a day (BID) | ORAL | 0 refills | Status: DC

## 2016-04-17 NOTE — Telephone Encounter (Signed)
Melissa with [facility] states patient is not able to get VEST from [place] and would like to now get it from [facility]. They need an addendum to patients most recent OV stating CY has reviewed CT Chest and shows Bronchiectasis and that flutter valve is ineffective and why.    CY Please advise. Thanks.    Can let Melissa know when completed to pull records from [facility]

## 2016-04-17 NOTE — Telephone Encounter (Signed)
Rx sent to preferred pharmacy. Pt aware & voiced understanding. Pt also states he would like [place] to let CY know that he seen his cardiologist and was placed on two new medications, one being nitro. Nothing further needed.   Will route to CY for FYI.

## 2016-04-17 NOTE — Telephone Encounter (Signed)
-----   Message from Darlina Guys sent at 04/15/2016 10:28 AM EST ----- Happy Wednesday! I need some help with this pt's [employer] referral. Could you call me when you get a minute?  I put a triage call in yesterday but it looks like it was closed out already.  I think it would be easier to talk with you anyway so you can relay the info directly to Dr. Annamaria Boots.  Thank you! Melissa  [phone]

## 2016-04-17 NOTE — Telephone Encounter (Signed)
error 

## 2016-04-17 NOTE — Telephone Encounter (Signed)
Staff message sent to [facility] with [facility] about addendum completed.

## 2016-04-17 NOTE — Telephone Encounter (Signed)
Done

## 2016-05-06 ENCOUNTER — Encounter: Payer: Self-pay | Admitting: Internal Medicine

## 2016-05-06 NOTE — Telephone Encounter (Signed)
CY Please advise. Thanks.  

## 2016-05-07 NOTE — Telephone Encounter (Signed)
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

## 2016-05-07 NOTE — Telephone Encounter (Signed)
Pt wants to speak to the nurse about vest [phone]

## 2016-05-14 ENCOUNTER — Telehealth: Payer: Self-pay | Admitting: Internal Medicine

## 2016-05-14 ENCOUNTER — Ambulatory Visit: Payer: Medicare Other | Admitting: Internal Medicine

## 2016-05-14 NOTE — Telephone Encounter (Signed)
I just got a message from [name] with [facility]. She stated that they were having trouble getting in touch with [name]. They have him approved and are ready to go. When they finally got to speak with [name] he doesn't want to go forward with the vest. He has plenty of questions for his doctor. He doesn't want to do the patients interview that is required for [employer]. [name] stated we could call her about this issue and let her know what the plans are for this patient.  [name] cell # [phone]

## 2016-05-14 NOTE — Telephone Encounter (Signed)
His wife was pressing for [place] to get the VEST approved. I am hoping it will help keep his lungs clearer.  Please see what his issues are now.

## 2016-05-14 NOTE — Telephone Encounter (Signed)
lmtcb

## 2016-05-22 ENCOUNTER — Telehealth: Payer: Self-pay | Admitting: Internal Medicine

## 2016-05-22 ENCOUNTER — Telehealth: Payer: Self-pay

## 2016-05-22 NOTE — Telephone Encounter (Signed)
Pt called and said he missed his pain mgt appt this morning due to vomiting and BP elevated. He contacted pain mgt and was advised his med would be sent to pharmacy. Pt checked and 2 meds were sent but not the morphine; pt request Dr Darnell Level to write morphine rx. Advised he would need to try to reach oncall for pain mgt. Pt said there was not oncall but what was Dr Darnell Level going to do about his BP going thru the roof; advised pt he has called late on a Fri and no more available appts today and pt should go to ED to be evaluated. Pt said that is why he had a PCP to take care of what he needed. I advised Dr Darnell Level saw him last 07/2015 and for best pt care at this time pt should go for eval at ED. Pt said he would be getting a new doctor and he hoped he did not have heart attack over the weekend. I advised that was why I was recommending pt go to ED to be evaluated. Pt hung up.

## 2016-05-22 NOTE — Telephone Encounter (Signed)
Noted. Agree with eval for elevated BP.  BP Readings from Last 3 Encounters:  03/30/16 (!) 152/78  01/13/16 120/70  12/16/15 (!) 164/83

## 2016-05-22 NOTE — Telephone Encounter (Signed)
Ambers Huey Bienenstock called askig for morphine tablets because Dr Hardin Negus his ortho is not available because is after hours. He tried PCP Ria Bush, MD but they declined (see their note). So, he called [facility] on call - advised I cannot honor opioid Rx a) because oour office does not do it for him on routine basis; b) needs paper script  -not possible after hours and on holiday weekend.  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 ADvised to go to ER  Dr. Brand Males, M.D., F.C.C.P Pulmonary and Critical Care Medicine Staff Physician [facility] Pager: [phone], If no answer or between  15:00h - 7:00h: call [phone]  05/22/2016 6:16 PM

## 2016-05-26 ENCOUNTER — Telehealth: Payer: Self-pay | Admitting: Cardiology

## 2016-05-26 NOTE — Telephone Encounter (Signed)
Returned patient call:  Patient states he has continued to have episodes of chest pain since seeing Dr. Ellyn Hack in October.  Reports having 2 episodes Friday, 3 episodes Saturday, 1 episode Monday and 1 episode this morning. Every episode he reports he has taken 1 NTG and the pain has resolved (reports compliance with Imdur as well).  Reports also being SOB, reports having to stop to rest x3 when going to the mailbox this AM.  Reports chest pain has occurred at rest and with exertion.  Denies CP at current.  Also reports BP has been flucuating 140/70 -190/98.     States he just completed abx for URI and has been sick this weekend vomiting, fever, and cough.  Continues to be treated for bronchiectasis, taking breathing treatments 3x day.  Reports he also ran out of pain medication this weekend (morphine), unable to get anyone to fill it until today, unsure if this has anything to do with pain (has only been taking 1/2 dose over the weekend).    States at OV with Dr. Renne Crigler advised to let us know if CP continued and heart cath would be scheduled.   Per OV note on 03/30/16 Any Other Special Instructions Will Be Listed Below (If Applicable). IF TAKING  NITRO MULTIPLE TIMES CALL OFFICE, WILL SCHEDULE A LEFT AND RIGHT CATHETERIZATION  Pt advised that if pain continues or increases to proceed to the ER for evaluation.  Advised I would make Dr. Ellyn Hack aware that his CP has continued and will call back with recommendations.  Pt verbalized understanding.

## 2016-05-26 NOTE — Telephone Encounter (Signed)
Mr.Kinner is calling because he is experiencing  some Angina , and have some shortness of breath . Please call

## 2016-05-28 ENCOUNTER — Encounter: Payer: Self-pay | Admitting: Internal Medicine

## 2016-05-28 ENCOUNTER — Ambulatory Visit (INDEPENDENT_AMBULATORY_CARE_PROVIDER_SITE_OTHER): Payer: Medicare Other | Admitting: Internal Medicine

## 2016-05-28 DIAGNOSIS — K219 Gastro-esophageal reflux disease without esophagitis: Secondary | ICD-10-CM | POA: Diagnosis not present

## 2016-05-28 DIAGNOSIS — J441 Chronic obstructive pulmonary disease with (acute) exacerbation: Secondary | ICD-10-CM

## 2016-05-28 MED ORDER — BUDESONIDE-FORMOTEROL FUMARATE 160-4.5 MCG/ACT IN AERO: INHALATION_SPRAY | RESPIRATORY_TRACT | 3 refills | Status: DC

## 2016-05-28 NOTE — Patient Instructions (Signed)
Script printed for Symbicort refill  We will look again at availability of pneumatic vest to help clear secretions  Please call as needed

## 2016-05-28 NOTE — Progress Notes (Signed)
HPI M Veteran, Never smoker followed for Bronchiectasis/recurrent pneumonia/Enterobacter due to reflux, complicated by DM, HBP, lung nodule, GERD, ischemic colitis/ multiple surgeries, valvular heart disease   ----------------------------------------------------------  01/13/2016-66 year old male [place] never smoker, followed for Bronchiectasis/recurrent pneumonia/Enterobacter/recurrent aspiration, complicated by DM, HBP, lung nodule, GERD, ischemic colitis/multiple surgeries, valvular heart disease FOLLOW FOR: coughing up brownish, yellow, green colored mucus (brought sample with him); just finished Clarithymycin from dental procedure. Wife here He had significant exacerbation of productive cough which is partially responded to Biaxin, now completed. Sputum remains purulent. Low-grade fevers and sweats. Pending ultrasound of right axillary mass "lipoma growing". He was told he needed to be coughing less and afebrile before this procedure. He brings a sample of recent sputum looking like yellow brown putty Addendum: I reviewed his Chest CT from 01/31/16 and there are dilated and thickened airways consistent with bronchiectasis, and this diagnosis is also supported by his clinical presentation. His Flutter device does not adequately mobilize secretions, which needs to happen if we are going to reduce his symptoms and need for frequent antibiotics.   05/28/2016-65 year old male veteran, never smoker, followed for Bronchiectasis/recurrent pneumonia/Enterobacter/recurrent aspiration, complicated by DM, HBP, lung nodule, GERD, ischemic colitis/multiple surgeries, valvular heart disease Follows: SOB, coughing up yellow tinged sputum, Chest tightness, Wheezing  Cardiology telephone note from 12/26 reviewed. Patient has been complaining of chest pain relieved by nitroglycerin and they may decide to schedule catheterization. He has been using morphine from Pain Management. Insurance wouldn't cover pneumatic  vest because radiologist did not describe bronchiectasis. His clinical pattern has certainly been consistent with bronchiectasis including chronic variable purulent cough requiring frequent antibiotics. He finished clindamycin for last acute bronchitis/bronchiectasis flare. Now reports developing sinus congestion with green/yellow nasal discharge. He is using saline rinses. Pending left total knee replacement and wearing knee brace.   ROS-   += pos Constitutional:   No-   weight loss, night sweats, +fevers, chills, fatigue, lassitude. HEENT:   No-  headaches, difficulty swallowing, +tooth/dental problems, sore throat,        sneezing, itching, ear ache, nasal congestion, post nasal drip,  CV:      chest pain, +orthopnea, PND, swelling in lower extremities, anasarca, dizziness, +palpitations Resp: + shortness of breath with exertion or at rest.              +  productive cough,  No non-productive cough,              +change in color of mucus.  No- wheezing.   Skin: No-   rash or lesions. GI:  No-   heartburn, indigestion, +abdominal pain, nausea, vomiting,  GU:  MS:  +  joint pain or swelling.  Neuro-     nothing unusual Psych:  No- change in mood or affect. + depression or anxiety.  No memory loss  OBJ- Physical Exam    General- Alert, Oriented, + anxious/depressed, Distress- none acute, + overweight Skin- + fungal nail changes right hand and also reported on toes of both feet Lymphadenopathy- none Head- atraumatic            Eyes- Gross vision intact, PERRLA, conjunctivae and secretions clear            Ears- Hearing, canals-normal            Nose-  no-Septal dev,polyps, erosion, perforation             Throat- Mallampati II , mucosa clear , drainage- none, tonsils- atrophic. + Missing teeth,  Neck- flexible ,  trachea midline, no stridor , thyroid nl, carotid no bruit Chest - symmetrical excursion , unlabored           Heart/CV- RRR ,  murmur + trace aortic , no gallop  , no rub, nl s1  s2                           - JVD- none , edema- none, stasis changes- none, varices- none           Lung-  + few  rhonchi/coarse breath sounds, cough-none, wheeze -none                  dullness-none, rub- none           Chest wall-  Abd- protuberant Br/ Gen/ Rectal- Not done, not indicated Extrem-  +cane,  Neuro- grossly intact to observation

## 2016-05-31 NOTE — Assessment & Plan Note (Signed)
Pattern of chronic cough productive purulent secretions and has culture gram-negative rods consistent with GI source/aspiration. Clinically there is bronchiectasis and he needs help with airway secretion management beyond what he has achieved with flutter device and conservative pulmonary toilet measures. Plan-we will try to help him get a pneumatic vest. Discussed resources and support available through the [employer]

## 2016-05-31 NOTE — Assessment & Plan Note (Signed)
Continued emphasis on scrupulous reflux and aspiration precautions

## 2016-06-15 ENCOUNTER — Other Ambulatory Visit: Payer: Self-pay | Admitting: Cardiology

## 2016-06-16 ENCOUNTER — Ambulatory Visit: Payer: Medicare Other | Admitting: Cardiology

## 2016-06-16 ENCOUNTER — Other Ambulatory Visit: Payer: Self-pay | Admitting: Surgery

## 2016-06-16 ENCOUNTER — Ambulatory Visit
Admission: RE | Admit: 2016-06-16 | Discharge: 2016-06-16 | Disposition: A | Discharge status: Home / Self Care | Payer: Medicare Other | Source: Ambulatory Visit | Attending: Surgery | Admitting: Surgery

## 2016-06-16 DIAGNOSIS — Z8719 Personal history of other diseases of the digestive system: Secondary | ICD-10-CM

## 2016-06-16 NOTE — Telephone Encounter (Signed)
I agree with the plan in this note. I can see him back on schedule.  [name], MD

## 2016-06-19 NOTE — Telephone Encounter (Addendum)
Called patient and made aware of Dr. Ellyn Hack recommendations.  Pt has appointment 2/14 with Dr. Ellyn Hack.   Pt verbalized understanding.

## 2016-06-22 ENCOUNTER — Other Ambulatory Visit: Payer: Self-pay | Admitting: Internal Medicine

## 2016-06-22 DIAGNOSIS — K56609 Unspecified intestinal obstruction, unspecified as to partial versus complete obstruction: Secondary | ICD-10-CM

## 2016-06-22 DIAGNOSIS — Z8679 Personal history of other diseases of the circulatory system: Secondary | ICD-10-CM

## 2016-06-22 DIAGNOSIS — J42 Unspecified chronic bronchitis: Secondary | ICD-10-CM

## 2016-06-30 ENCOUNTER — Telehealth: Payer: Self-pay | Admitting: Internal Medicine

## 2016-06-30 MED ORDER — AMOXICILLIN 500 MG PO TABS: 500.0000 mg | ORAL_TABLET | Freq: Two times a day (BID) | ORAL | 0 refills | Status: DC

## 2016-06-30 NOTE — Telephone Encounter (Signed)
Spoke with pt. States that he is having a flare up. Reports SOB, chest tightness, wheezing, coughing and fever. Cough is producing gray mucus. Symptoms started yesterday morning. Has not been taking any OTC meds. Has been taking all pulmonary medications as prescribed. Would like CY's recommendations.  CY - please advise. Thanks.  Allergies  Allergen Reactions  . Ivp Dye [Iodinated Diagnostic Agents]     Omni - Paque Contrast   . Oxycodone Hcl Shortness Of Breath and Rash  . Atorvastatin     REACTION: myalgias  . Bystolic [Nebivolol Hcl]     Nightmares, flashbacks  . Codeine     rash  . Doxycycline     Rash, SOB  . Iohexol      Desc: hives,neck and torso erythemia   . Levaquin [Levofloxacin]     Itching, welps  . Methocarbamol     rash  . Pentazocine Lactate     rash  . Succinylcholine Chloride Other (See Comments)    unknown  . Synvisc [Hylan G-F 20]     rash  . Butorphanol Tartrate Rash  . Dilaudid [Hydromorphone Hcl] Nausea Only and Rash    Aggressive    Current Outpatient Prescriptions on File Prior to Visit  Medication Sig Dispense Refill  . albuterol (PROVENTIL HFA) 108 (90 BASE) MCG/ACT inhaler Inhale 2 puffs into the lungs every 4 (four) hours as needed. For wheezing and/or shortness of breath 3 Inhaler 4  . albuterol (PROVENTIL) (2.5 MG/3ML) 0.083% nebulizer solution Take 3 mLs (2.5 mg total) by nebulization every 6 (six) hours as needed for wheezing or shortness of breath. 75 mL 11  . ALPRAZolam (XANAX) 1 MG tablet Take 1 tablet (1 mg total) by mouth 3 (three) times daily as needed for anxiety or sleep. 90 tablet 0  . aspirin 81 MG chewable tablet Chew 81 mg by mouth daily.    . budesonide-formoterol (SYMBICORT) 160-4.5 MCG/ACT inhaler 2 puffs then rinse mouth well, twice daily 3 Inhaler 3  . carvedilol (COREG) 25 MG tablet TAKE 1 TABLET (25 MG TOTAL) BY MOUTH TWICE DAILY. 180 tablet 3  . clarithromycin (BIAXIN) 500 MG tablet Take 1 tablet (500 mg total) by  mouth 2 (two) times daily. (Patient not taking: Reported on 05/28/2016) 20 tablet 0  . clindamycin (CLEOCIN) 300 MG capsule Take 1 capsule (300 mg total) by mouth 2 (two) times daily. (Patient not taking: Reported on 05/28/2016) 14 capsule 0  . furosemide (LASIX) 20 MG tablet Take 1 tablet (20 mg total) by mouth daily. MAY TAKE ADDITIONAL TABLET AS NEEDED SWELLING OR SHORTNESS OF BREATH 180 tablet 3  . hydrALAZINE (APRESOLINE) 25 MG tablet Take 1 tablet (25 mg total) by mouth 2 (two) times daily. 180 tablet 3  . hydrALAZINE (APRESOLINE) 25 MG tablet TAKE 1 TABLET BY MOUTH DAILY AS NEEDED 90 tablet 0  . hyoscyamine (LEVBID) 0.375 MG 12 hr tablet Take 1 tablet (0.375 mg total) by mouth as needed. For stomach cramps (Patient not taking: Reported on 05/28/2016) 60 tablet 3  . irbesartan-hydrochlorothiazide (AVALIDE) 300-12.5 MG tablet TAKE 1 TABLET BY MOUTH DAILY. 30 tablet 11  . isosorbide mononitrate (IMDUR) 30 MG 24 hr tablet Take 1 tablet (30 mg total) by mouth daily. TAKE IN THE MORNING WITH HYDRALAZINE 90 tablet 3  . morphine (MSIR) 30 MG tablet Take 1 tablet (30 mg total) by mouth every 4 (four) hours as needed. FOR PAIN 120 tablet 0  . Multiple Vitamins-Minerals (CENTRUM SILVER PO) Take 1 capsule by  mouth daily.    . nitroGLYCERIN (NITROSTAT) 0.4 MG SL tablet Place 1 tablet (0.4 mg total) under the tongue every 5 (five) minutes as needed for chest pain. 25 tablet PRN  . Polyethyl Glycol-Propyl Glycol (SYSTANE) 0.4-0.3 % SOLN Apply 1 drop to eye 2 (two) times daily as needed.    . promethazine (PHENERGAN) 25 MG tablet Take 1 tablet (25 mg total) by mouth every 6 (six) hours as needed. FOR NAUSEA 90 tablet 0  . Respiratory Therapy Supplies (FLUTTER) DEVI Use as directed 1 each 0  . tiotropium (SPIRIVA) 18 MCG inhalation capsule Place 1 capsule (18 mcg total) into inhaler and inhale daily. 90 capsule 4   No current facility-administered medications on file prior to visit.

## 2016-06-30 NOTE — Telephone Encounter (Signed)
Suggest amoxacillin 500 mg, # 20, 1 twice daily

## 2016-06-30 NOTE — Telephone Encounter (Signed)
Spoke with the pt and notified of recs per CDY  He verbalized understanding   I have sent rx

## 2016-07-08 ENCOUNTER — Ambulatory Visit (INDEPENDENT_AMBULATORY_CARE_PROVIDER_SITE_OTHER): Payer: Medicare Other | Admitting: Internal Medicine

## 2016-07-08 ENCOUNTER — Encounter: Payer: Self-pay | Admitting: Internal Medicine

## 2016-07-08 ENCOUNTER — Telehealth: Payer: Self-pay | Admitting: Internal Medicine

## 2016-07-08 VITALS — BP 124/86 | HR 70 | Ht 72.0 in | Wt 232.0 lb

## 2016-07-08 DIAGNOSIS — R079 Chest pain, unspecified: Secondary | ICD-10-CM

## 2016-07-08 DIAGNOSIS — J449 Chronic obstructive pulmonary disease, unspecified: Secondary | ICD-10-CM

## 2016-07-08 DIAGNOSIS — J42 Unspecified chronic bronchitis: Secondary | ICD-10-CM

## 2016-07-08 NOTE — Assessment & Plan Note (Signed)
Sustained chronic bronchitis. He always has significant airway noise and describes active productive cough today and at every visit. I am really surprised there are not more clear-cut bronchiectatic changes recognized by Radiology on his imaging. I still think a pneumatic vest would be a good idea for him when available. He does find benefit from Spiriva so we will stick with Symbicort and his nebulizer machine Plan-sputum cultures

## 2016-07-08 NOTE — Patient Instructions (Signed)
Order- Sputum C&S   Routine, fungal, AFB    Dx Chronic bronchitis  Ok to stay off Spiriva since it doesn't seem to help. Continue Symbicort.    Please call if we can help

## 2016-07-08 NOTE — Progress Notes (Signed)
HPI M Veteran, Never smoker followed for Bronchiectasis/recurrent pneumonia/Enterobacter due to reflux, complicated by DM, HBP, lung nodule, GERD, ischemic colitis/ multiple surgeries, valvular heart disease   ----------------------------------------------------------  05/28/2016-66 year old male veteran, never smoker, followed for Bronchiectasis/recurrent pneumonia/Enterobacter/recurrent aspiration, complicated by DM, HBP, lung nodules, GERD, ischemic colitis/multiple surgeries, valvular heart disease, ASCVD Follows: SOB, coughing up yellow tinged sputum, Chest tightness, Wheezing  Cardiology telephone note from 12/26 reviewed. Patient has been complaining of chest pain relieved by nitroglycerin and they may decide to schedule catheterization. He has been using morphine from Pain Management. Insurance wouldn't cover pneumatic vest because radiologist did not describe bronchiectasis. His clinical pattern has certainly been consistent with bronchiectasis including chronic variable purulent cough requiring frequent antibiotics. He finished clindamycin for last acute bronchitis/bronchiectasis flare. Now reports developing sinus congestion with green/yellow nasal discharge. He is using saline rinses. Pending left total knee replacement and wearing knee brace.  07/08/2816- 66 year old male [name], never smoker, followed for Bronchiectasis/recurrent pneumonia/Enterobacter/recurrent aspiration, complicated by DM, HBP, lung nodule, GERD, ischemic colitis/multiple surgeries, valvular heart disease, ASCVD 4 mo for COPD Ran out of Spiriva and doesn't miss it. Symbicort helps better. Still productive cough with clear to milky sputum. No fever or sweat. Using "essential oils" in room humidifier. Taking nitroglycerin once or twice daily for left anterior chest pains, not clearly exertional. Cardiology follow-up pending  Coverage was denied for pneumatic vest to help secretion clearance.  ROS-   +=  pos Constitutional:   No-   weight loss, night sweats, +fevers, chills, fatigue, lassitude. HEENT:   No-  headaches, difficulty swallowing, +tooth/dental problems, sore throat,        sneezing, itching, ear ache, nasal congestion, post nasal drip,  CV:      chest pain, +orthopnea, PND, swelling in lower extremities, anasarca, dizziness, +palpitations Resp: + shortness of breath with exertion or at rest.              +  productive cough,  No non-productive cough,              change in color of mucus.  No- wheezing.   Skin: No-   rash or lesions. GI:  No-   heartburn, indigestion, +abdominal pain, nausea, vomiting,  GU:  MS:  +  joint pain or swelling.  Neuro-     nothing unusual Psych:  No- change in mood or affect. + depression or anxiety.  No memory loss  OBJ- Physical Exam   + color is better and he clearly seems to feel better this visit General- Alert, Oriented, + anxious/depressed, Distress- none acute, + overweight Skin- + fungal nail changes right hand and also reported on toes of both feet Lymphadenopathy- none Head- atraumatic            Eyes- Gross vision intact, PERRLA, conjunctivae and secretions clear            Ears- Hearing, canals-normal            Nose-  no-Septal dev,polyps, erosion, perforation             Throat- Mallampati II , mucosa clear , drainage- none, tonsils- atrophic. + Missing teeth,  Neck- flexible , trachea midline, no stridor , thyroid nl, carotid no bruit Chest - symmetrical excursion , unlabored           Heart/CV- RRR ,  murmur + trace aortic , no gallop  , no rub, nl s1 s2                           -  JVD- none , edema- none, stasis changes- none, varices- none           Lung-  + bilateral  rhonchi/coarse breath sounds, cough-none, wheeze -none                  dullness-none, rub- none           Chest wall-  Abd- protuberant Br/ Gen/ Rectal- Not done, not indicated Extrem-  +cane,  Neuro- grossly intact to observation

## 2016-07-08 NOTE — Assessment & Plan Note (Signed)
He describes use of nitroglycerin at least a couple of days per week. Chest pain is not clearly exertional. He is encouraged to keep follow-up with cardiology

## 2016-07-09 NOTE — Telephone Encounter (Signed)
Opened in error

## 2016-07-13 ENCOUNTER — Other Ambulatory Visit: Payer: Medicare Other

## 2016-07-13 DIAGNOSIS — J42 Unspecified chronic bronchitis: Secondary | ICD-10-CM

## 2016-07-15 ENCOUNTER — Ambulatory Visit (INDEPENDENT_AMBULATORY_CARE_PROVIDER_SITE_OTHER): Payer: Medicare Other | Admitting: Cardiology

## 2016-07-15 ENCOUNTER — Encounter: Payer: Self-pay | Admitting: Cardiology

## 2016-07-15 VITALS — BP 180/100 | HR 71 | Ht 72.0 in | Wt 236.0 lb

## 2016-07-15 DIAGNOSIS — R600 Localized edema: Secondary | ICD-10-CM

## 2016-07-15 DIAGNOSIS — R0600 Dyspnea, unspecified: Secondary | ICD-10-CM

## 2016-07-15 DIAGNOSIS — Z8249 Family history of ischemic heart disease and other diseases of the circulatory system: Secondary | ICD-10-CM | POA: Diagnosis not present

## 2016-07-15 DIAGNOSIS — R0609 Other forms of dyspnea: Secondary | ICD-10-CM

## 2016-07-15 DIAGNOSIS — Z01810 Encounter for preprocedural cardiovascular examination: Secondary | ICD-10-CM

## 2016-07-15 DIAGNOSIS — I200 Unstable angina: Secondary | ICD-10-CM

## 2016-07-15 DIAGNOSIS — D689 Coagulation defect, unspecified: Secondary | ICD-10-CM

## 2016-07-15 DIAGNOSIS — J449 Chronic obstructive pulmonary disease, unspecified: Secondary | ICD-10-CM

## 2016-07-15 DIAGNOSIS — I350 Nonrheumatic aortic (valve) stenosis: Secondary | ICD-10-CM

## 2016-07-15 MED ORDER — PREDNISONE 20 MG PO TABS: ORAL_TABLET | ORAL | 0 refills | Status: DC

## 2016-07-15 NOTE — Progress Notes (Signed)
PCP: Ria Bush, MD  Clinic Note: Chief Complaint  Patient presents with  . Follow-up    Continues to complain of chest pain    HPI: [name] is a 66 y.o. male with a PMH below who presents today for 3-4 month follow-up with mild aortic stenosis and intermittent chest discomfort episodes. He is a [place] Veteran History of Agent Orange Exposure he has had multiple wound was; redo [place] surgery. Major issues have been revolving around bowel obstructions. He has had a normal cardiac catheterization as well as a negative Myoview in the past. Cardiac catheterization was in June 2013 - normal coronaries   Degan ISHAK KEAST was last seen In October 2017. Would check 2-D echocardiogram.  He saw Dr. Annamaria Boots earlier this month for chronic bronchitis issues.  Recent Hospitalizations: None -- he did have a slight bout of ileus recently, but has avoided hospitalization. He had a lot of stool issues since then.  Studies Reviewed:   2-D echo -October 2017: Mild LVH. EF 60-65%. GR 1 DD. Stenosis (mean gradient 17 mmHg).  Interval History: [name] presents today continues distally comment on having "angina pains both at rest and with exertion. He is wearing a mask, is recovering from a bout of bronchitis on antibiotics. Between diarrhea from antibiotics and his ileus, he is having lots of problems there.  Despite having relatively normal echocardiogram negative Myoview in the past, he still complains of of anginal pains. Is very difficult to tell what symptom is having because he really doesn't do much exertion with his arthritis pains. He is hoping to have knee surgery, and needs cardiac clearance.  He denies any PND or orthopnea, but doesn't sleep lying flat mostly because of his back. He also has GERD and is down. He has intermittent swelling, but is probably more related to his arthritis.   No palpitations, lightheadedness, dizziness, weakness or syncope/near syncope. No  TIA/amaurosis fugax symptoms. No melena, hematochezia, hematuria, or epstaxis. No claudication.  ROS: A comprehensive was performed. Review of Systems  Constitutional: Positive for fever (With his recent bout of bronchitis).  HENT: Positive for congestion.   Respiratory: Positive for cough, shortness of breath and wheezing.   Cardiovascular:       Per history of present illness  Gastrointestinal: Positive for abdominal pain, constipation, diarrhea and nausea.  Genitourinary: Positive for hematuria (None recently). Negative for flank pain.  Musculoskeletal: Positive for joint pain (Knees mostly (L>R)).  Skin: Positive for rash (Associated with one of his medications).  Neurological: Positive for headaches. Negative for focal weakness.  Psychiatric/Behavioral: Positive for memory loss. Negative for depression. The patient has insomnia.   All other systems reviewed and are negative.   Past Medical History:  Diagnosis Date  . Anemia   . Asthma   . Back pain, chronic, followed at pain clinic 11/25/2011  . Carotid artery stenosis 09/06/2014  . Chest pain with low risk for cardiac etiology 10/2011   Non-obstructive CAD by Cath; negative Lexiscan in 08/2011  . Clotting disorder (HCC)    LLE DVT (also small vessel)  . Colon polyps   . COPD (chronic obstructive pulmonary disease) ([place])   . Dementia    Mild  . Depression   . Dyslipidemia (high LDL; low HDL)    statin intolerant; on fibrate  . Edema leg    Wears compression stockings; 08/2012 dopplers - no DVT or thrombophlebitis; mild R Popliteal V reflux - no VNUS ablation candidate  . Fainting   .  Family history of acute myocardial infarction. and premature CAD 11/25/2011  . GERD (gastroesophageal reflux disease)   . Hearing loss   . History of diabetes mellitus   . Hypertension    very labile  . Mild aortic stenosis by prior echocardiogram 03/2012   moderate sclerosis  . Nephrolithiasis   . Osteoarthritis   . Other idiopathic  peripheral autonomic neuropathy    Agent orange  . PTSD (post-traumatic stress disorder) 07/24/2015  . Seizure ([place])   . Small bowel obstruction 1990s, 2001, 2015   s/p multiple bowel surgeries; from war wounds  . Stroke ([place])   . Weakness     Past Surgical History:  Procedure Laterality Date  . APPENDECTOMY  1958   per patient  . CARDIOVASCULAR STRESS TEST  08/2011   Negative lexiscan myoview  . Carotid Dopplers  01/2016   Stable. RICA - slight progression from<40% to 40-59%.  Otherwise stable bilateral carotids and subclavian arteries. Stable LICA  . COLONOSCOPY    . exploratory laparotomy with extensive lysis of adhesions  10/1999  . JOINT REPLACEMENT     right knee replaced 2x  . LEFT HEART CATHETERIZATION WITH CORONARY ANGIOGRAM N/A 11/26/2011   WNL Lorretta Harp, MD  . small bowel obstruction  1996, 1999, 2001, 2015  . TONSILLECTOMY  1960   per patient  . TOTAL KNEE ARTHROPLASTY    . TRANSTHORACIC ECHOCARDIOGRAM  03/24/2016   Mild LVH. EF 60-65%. GR 1 DD. Severely restricted calcified noncoronary cusp of the aortic valve. Mild aortic stenosis (mean gradient 17 mmHg) with mild regurgitation.  . TRANSTHORACIC ECHOCARDIOGRAM  2012; 03/2012   a. Essentially normal wirth moderate concentric LVH and moderate aortic sclerosis; b. Mild concentric LVH. EF greater than 55%. Mild aortic stenosis. Grade 1 diastolic dysfunction    Current Meds  Medication Sig  . albuterol (PROVENTIL HFA) 108 (90 BASE) MCG/ACT inhaler Inhale 2 puffs into the lungs every 4 (four) hours as needed. For wheezing and/or shortness of breath  . albuterol (PROVENTIL) (2.5 MG/3ML) 0.083% nebulizer solution Take 3 mLs (2.5 mg total) by nebulization every 6 (six) hours as needed for wheezing or shortness of breath.  . ALPRAZolam (XANAX) 1 MG tablet Take 1 tablet (1 mg total) by mouth 3 (three) times daily as needed for anxiety or sleep.  [name] amoxicillin (AMOXIL) 500 MG tablet Take 1 tablet (500 mg total) by mouth 2  (two) times daily.  [name] aspirin 81 MG chewable tablet Chew 81 mg by mouth daily.  . budesonide-formoterol (SYMBICORT) 160-4.5 MCG/ACT inhaler 2 puffs then rinse mouth well, twice daily  . carvedilol (COREG) 25 MG tablet TAKE 1 TABLET (25 MG TOTAL) BY MOUTH TWICE DAILY.  . furosemide (LASIX) 20 MG tablet Take 1 tablet (20 mg total) by mouth daily. MAY TAKE ADDITIONAL TABLET AS NEEDED SWELLING OR SHORTNESS OF BREATH  . hydrALAZINE (APRESOLINE) 25 MG tablet TAKE 1 TABLET BY MOUTH DAILY AS NEEDED  . irbesartan-hydrochlorothiazide (AVALIDE) 300-12.5 MG tablet TAKE 1 TABLET BY MOUTH DAILY.  [name] morphine (MSIR) 30 MG tablet Take 1 tablet (30 mg total) by mouth every 4 (four) hours as needed. FOR PAIN  . Multiple Vitamins-Minerals (CENTRUM SILVER PO) Take 1 capsule by mouth daily.  . nitroGLYCERIN (NITROSTAT) 0.4 MG SL tablet Place 1 tablet (0.4 mg total) under the tongue every 5 (five) minutes as needed for chest pain.  . promethazine (PHENERGAN) 25 MG tablet Take 1 tablet (25 mg total) by mouth every 6 (six) hours as needed. FOR  NAUSEA  . Respiratory Therapy Supplies (FLUTTER) DEVI Use as directed    Allergies  Allergen Reactions  . Ivp Dye [Iodinated Diagnostic Agents]     Omni - Paque Contrast   . Oxycodone Hcl Shortness Of Breath and Rash  . Atorvastatin     REACTION: myalgias  . Bystolic [Nebivolol Hcl]     Nightmares, flashbacks  . Codeine     rash  . Doxycycline     Rash, SOB  . Iohexol      Desc: hives,neck and torso erythemia   . Levaquin [Levofloxacin]     Itching, welps  . Methocarbamol     rash  . Pentazocine Lactate     rash  . Succinylcholine Chloride Other (See Comments)    unknown  . Synvisc [Hylan G-F 20]     rash  . Butorphanol Tartrate Rash  . Dilaudid [Hydromorphone Hcl] Nausea Only and Rash    Aggressive     Social History   Social History  . Marital status: Married    Spouse name: N/A  . Number of children: 3  . Years of education: N/A   Occupational  History  . Disabled    Social History Main Topics  . Smoking status: Never Smoker  . Smokeless tobacco: Never Used  . Alcohol use No  . Drug use: No  . Sexual activity: Yes   Other Topics Concern  . None   Social History Narrative   He is a father of 62, grandfather 61.    One of his sons was killed during the war in [place]. Grandson killed bike accident 2017.   He was exposed to [employer].   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   Not very physically active due to extreme disability/debilitation from osteoarthritis and peripheral neuropathy.    family history includes Heart attack in his brother, father, mother, sister, and son; Heart disease in his father and mother; Hypertension in his brother and brother; Non-Hodgkin's lymphoma in his daughter; Sleep apnea in his son; Stroke in his mother and son.  Wt Readings from Last 3 Encounters:  07/15/16 107 kg (236 lb)  07/08/16 105.2 kg (232 lb)  05/28/16 109.6 kg (241 lb 9.6 oz)    PHYSICAL EXAM BP (!) 180/100   Pulse 71   Ht 6' (1.829 m)   Wt 107 kg (236 lb)   BMI 32.01 kg/m  General appearance: alert, cooperative, appears stated age, no distress and Mildly obese. He is wearing a mask because of his recent bronchitis Neck: no adenopathy, no carotid bruit and no JVD Lungs: clear to auscultation bilaterally with the exception of mild right-sided crackles (not rales), normal percussion bilaterally and non-labored Heart: regular rate and rhythm, S1, S2 normal, no S3 or S4; 2/6, harsh crescendo- decrescendo SEM @ 2nd right intercostal space, radiates to carotids, no click and no rub Abdomen: Soft, mildly distended and tender to palpation. Hollow/tympanitic. There are increased bowel sounds Extremities: extremities normal, atraumatic, no cyanosis, and edema trace Pulses: 2+ and symmetric;  Neurologic: Mental status: Alert, oriented, thought content appropriate Cranial  nerves: normal (II-XII grossly intact    Adult ECG Report  Rate: 71 ;  Rhythm: normal sinus rhythm; LAE, otherwise normal axis, intervals & durations.  Narrative Interpretation: Stable/ normal EKG   Other studies Reviewed: Additional studies/ records that were reviewed today include:  Recent Labs:  Due for Lipids - will check with Pre-Cath Labs.  ASSESSMENT / PLAN: Problem List Items Addressed This Visit    Bilateral lower extremity edema (Chronic)    Stable on current dose of Lasix.      COPD mixed type [facility])    He has sustained chronic bronchitis symptoms and now notably dyspneic. Being treated by Dr. Annamaria Boots from pulmonary medicine.  Plan will be to check right heart catheterization along left heart to assess pulmonary pressures for possible pulmonary hypertension.      Relevant Medications   predniSONE (DELTASONE) 20 MG tablet   Other Relevant Orders   EKG 12-Lead (Completed)   LEFT AND RIGHT HEART CATHETERIZATION WITH CORONARY ANGIOGRAM   APTT   Basic metabolic panel   CBC   Protime-INR   DOE (dyspnea on exertion) (Chronic)   Relevant Orders   EKG 12-Lead (Completed)   LEFT AND RIGHT HEART CATHETERIZATION WITH CORONARY ANGIOGRAM   APTT   Basic metabolic panel   CBC   Protime-INR   Family history of acute myocardial infarction. and premature CAD (Chronic)   Relevant Orders   EKG 12-Lead (Completed)   LEFT AND RIGHT HEART CATHETERIZATION WITH CORONARY ANGIOGRAM   APTT   Basic metabolic panel   CBC   Protime-INR   Mild aortic stenosis by prior echocardiogram (Chronic)    No real significant change in aortic valve gradient on most recent echocardiogram. Can wait 2 years to reevaluate      Preop cardiovascular exam (Chronic)    In order to fully assess him for cardiovascular risk standpoint since she simply cannot walk enough to stress himself and determine exertional symptoms with active chest pain complaints, I think the best option is a catheterization.  Provided no significant lesion noted, he would only have the risk of his bronchiectasis and no cardiac risk.      Relevant Orders   EKG 12-Lead (Completed)   LEFT AND RIGHT HEART CATHETERIZATION WITH CORONARY ANGIOGRAM   APTT   Basic metabolic panel   CBC   Protime-INR   Progressive angina (HCC) - Primary (Chronic)    He says he uses nitroglycerin several times a day several times a week. It is not exertional and it doesn't seem to be consistent with angina. However we know that he has had been Agent Orange exposure and has lots of risk factors.   Plan: With persistent symptoms despite negative evaluations with noninvasive studies, I think the only effective way to fully evaluate him from a cardiac risk standpoint would be to perform cardiac catheterization. We will schedule right and left heart catheterization in order to also assess for pulmonary hypertension etc. with his bronchiectasis.  We will plan to increase his Imdur to 60 mg & stop hydralazine and start amlodipine.(pending CATH)   He is already on carvedilol and ARB.  He will need Pre-CATH medication for Contrast Hypersensitivity.      Relevant Orders   EKG 12-Lead (Completed)   LEFT AND RIGHT HEART CATHETERIZATION WITH CORONARY ANGIOGRAM   APTT   Basic metabolic panel   CBC   Protime-INR    Other Visit Diagnoses    Blood clotting disorder ([place])       Relevant Orders   APTT   Protime-INR      Current medicines are reviewed at length with the patient today. (+/- concerns) n/a The following changes have been made: see below  Patient Instructions  Schedule  Left and right  With Dr Avelino Leeds physician has requested that you have a cardiac catheterization. Cardiac catheterization is used  to diagnose and/or treat various heart conditions. Doctors may recommend this procedure for a number of different reasons. The most common reason is to evaluate chest pain. Chest pain can be a symptom of coronary artery disease  (CAD), and cardiac catheterization can show whether plaque is narrowing or blocking your heart's arteries. This procedure is also used to evaluate the valves, as well as measure the blood flow and oxygen levels in different parts of your heart. For further information please visit [facility]uge[email or web address]. Please follow instruction sheet, as given.  LABS - BMP ,PT ,PTT, CBC ,    MEDICATION 60 MG PREDNISONE  6 PM  NIGHT BEFORE AND BEDTIME. AND THEN THE MORNING OF  AND AFTERNOON OF PROCEDURE  ALSO TAKE OVER THE COUNTER - BENADRYL 25 MG AT BEDTIME PRIOR TO PROCEDURE AND MORNING OF THE PROCEDURE.   PEPCID AC 20 MG   PRIOR TO PROCEDURE AND MORNING OF THE PROCEDURE.        Your physician recommends that you schedule a follow-up appointment in [place].     Studies Ordered:   Orders Placed This Encounter  Procedures  . APTT  . Basic metabolic panel  . CBC  . Protime-INR  . EKG 12-Lead  . LEFT AND RIGHT HEART CATHETERIZATION WITH CORONARY Illene Silver, M.D., M.S. Interventional Cardiologist   Pager # [phone] Phone # [phone] [address] [place], [place] [address]

## 2016-07-15 NOTE — Patient Instructions (Addendum)
Schedule  Left and right  With Dr Avelino Leeds physician has requested that you have a cardiac catheterization. Cardiac catheterization is used to diagnose and/or treat various heart conditions. Doctors may recommend this procedure for a number of different reasons. The most common reason is to evaluate chest pain. Chest pain can be a symptom of coronary artery disease (CAD), and cardiac catheterization can show whether plaque is narrowing or blocking your heart's arteries. This procedure is also used to evaluate the valves, as well as measure the blood flow and oxygen levels in different parts of your heart. For further information please visit [email or web address][email or web address]. Please follow instruction sheet, as given.  LABS - BMP ,PT ,PTT, CBC ,    MEDICATION 60 MG PREDNISONE  6 PM  NIGHT BEFORE AND BEDTIME. AND THEN THE MORNING OF  AND AFTERNOON OF PROCEDURE  ALSO TAKE OVER THE COUNTER - BENADRYL 25 MG AT BEDTIME PRIOR TO PROCEDURE AND MORNING OF THE PROCEDURE.   PEPCID AC 20 MG   PRIOR TO PROCEDURE AND MORNING OF THE PROCEDURE.        Your physician recommends that you schedule a follow-up appointment in [employer].

## 2016-07-16 ENCOUNTER — Encounter: Payer: Self-pay | Admitting: Cardiology

## 2016-07-16 LAB — RESPIRATORY CULTURE OR RESPIRATORY AND SPUTUM CULTURE

## 2016-07-17 ENCOUNTER — Encounter: Payer: Self-pay | Admitting: Cardiology

## 2016-07-17 ENCOUNTER — Telehealth: Payer: Self-pay | Admitting: Cardiology

## 2016-07-17 NOTE — Assessment & Plan Note (Signed)
He has sustained chronic bronchitis symptoms and now notably dyspneic. Being treated by Dr. Annamaria Boots from pulmonary medicine.  Plan will be to check right heart catheterization along left heart to assess pulmonary pressures for possible pulmonary hypertension.

## 2016-07-17 NOTE — Assessment & Plan Note (Signed)
No real significant change in aortic valve gradient on most recent echocardiogram. Can wait 2 years to reevaluate

## 2016-07-17 NOTE — Telephone Encounter (Signed)
New message    Pt calling to ask if he can go get blood work on Monday?   Pt c/o BP issue: STAT if pt c/o blurred vision, one-sided weakness or slurred speech  1. What are your last 5 BP readings? 119/49 121/30  2. Are you having any other symptoms (ex. Dizziness, headache, blurred vision, passed out)? no  3. What is your BP issue? Last night, blood pressure went low

## 2016-07-17 NOTE — Assessment & Plan Note (Signed)
In order to fully assess him for cardiovascular risk standpoint since she simply cannot walk enough to stress himself and determine exertional symptoms with active chest pain complaints, I think the best option is a catheterization. Provided no significant lesion noted, he would only have the risk of his bronchiectasis and no cardiac risk.

## 2016-07-17 NOTE — Assessment & Plan Note (Addendum)
He says he uses nitroglycerin several times a day several times a week. It is not exertional and it doesn't seem to be consistent with angina. However we know that he has had been Agent Orange exposure and has lots of risk factors.   Plan: With persistent symptoms despite negative evaluations with noninvasive studies, I think the only effective way to fully evaluate him from a cardiac risk standpoint would be to perform cardiac catheterization. We will schedule right and left heart catheterization in order to also assess for pulmonary hypertension etc. with his bronchiectasis.  We will plan to increase his Imdur to 60 mg & stop hydralazine and start amlodipine.(pending CATH)   He is already on carvedilol and ARB.  He will need Pre-CATH medication for Contrast Hypersensitivity.

## 2016-07-17 NOTE — Assessment & Plan Note (Signed)
Stable on current dose of Lasix.

## 2016-07-17 NOTE — Telephone Encounter (Signed)
Spoke to patient , it is okay to have lab done on Monday.  patient had blood pressure 126-127/49 -50 last evening. Up last night - with bronchitis - coughing  Nausea.  Informed patient if he is still feeling weak and coughing nausea on Tuesday call may postpone cath

## 2016-07-17 NOTE — Assessment & Plan Note (Deleted)
He has had nonobstructive CAD by cath in the past. We will reassess with a right left heart catheterization.

## 2016-07-20 ENCOUNTER — Other Ambulatory Visit: Payer: Self-pay | Admitting: *Deleted

## 2016-07-20 ENCOUNTER — Telehealth: Payer: Self-pay | Admitting: Cardiology

## 2016-07-20 DIAGNOSIS — E785 Hyperlipidemia, unspecified: Secondary | ICD-10-CM

## 2016-07-20 NOTE — Telephone Encounter (Signed)
New Message   Per pt ran fever all weekend, and was instructed to reschedule catherization if he was still running fever on Monday. Requesting call back from nurse.

## 2016-07-20 NOTE — Telephone Encounter (Signed)
Spoke with patient Reschedule right and left heart cat for feb 27 ,2018 at 9 am. Patient should be at registration at [facility] to be done by feb 22 or 23 ,2018.  Patient verbalized understanding.   Patient aware to take pre -med for contrast allergies the day  before procedure on 07/28/16 and to be NPO PRIOR TO LABS.  Patient aware to call if he still has a fever .

## 2016-07-20 NOTE — Telephone Encounter (Signed)
PT CALLED THIS  AM   STILL NOT  FEELING WELL(HAS BRONCHITIS ) AND  FEVER  OF 101  AS OF THIS MORNING  . CATH  FOR WEDNESDAY WAS  CANCELLED the patient  TO  CALL BACK AND RESCHEDULE  WHEN FEELS BETTER .Adonis Housekeeper

## 2016-07-20 NOTE — Telephone Encounter (Signed)
Ok - can reschedule for next week.  [name]

## 2016-07-22 ENCOUNTER — Ambulatory Visit (HOSPITAL_COMMUNITY): Admit: 2016-07-22 | Payer: Medicare Other | Admitting: Cardiology

## 2016-07-22 ENCOUNTER — Encounter (HOSPITAL_COMMUNITY): Payer: Self-pay

## 2016-07-22 SURGERY — RIGHT/LEFT HEART CATH AND CORONARY ANGIOGRAPHY

## 2016-07-23 ENCOUNTER — Other Ambulatory Visit: Payer: Self-pay | Admitting: Family Medicine

## 2016-07-23 DIAGNOSIS — E559 Vitamin D deficiency, unspecified: Secondary | ICD-10-CM

## 2016-07-23 DIAGNOSIS — Z125 Encounter for screening for malignant neoplasm of prostate: Secondary | ICD-10-CM

## 2016-07-23 DIAGNOSIS — E118 Type 2 diabetes mellitus with unspecified complications: Secondary | ICD-10-CM

## 2016-07-23 DIAGNOSIS — E785 Hyperlipidemia, unspecified: Secondary | ICD-10-CM

## 2016-07-24 ENCOUNTER — Other Ambulatory Visit: Payer: Self-pay | Admitting: *Deleted

## 2016-07-24 ENCOUNTER — Ambulatory Visit: Payer: Medicare Other

## 2016-07-24 DIAGNOSIS — Z01810 Encounter for preprocedural cardiovascular examination: Secondary | ICD-10-CM

## 2016-07-25 LAB — CBC
HCT: 42.1 % (ref 38.5–50.0)
Hemoglobin: 14.1 g/dL (ref 13.2–17.1)
MCH: 29.5 pg (ref 27.0–33.0)
MCHC: 33.5 g/dL (ref 32.0–36.0)
MCV: 88.1 fL (ref 80.0–100.0)
MPV: 10.6 fL (ref 7.5–12.5)
PLATELETS: 223 10*3/uL (ref 140–400)
RBC: 4.78 MIL/uL (ref 4.20–5.80)
RDW: 13.3 % (ref 11.0–15.0)
WBC: 8.5 10*3/uL (ref 3.8–10.8)

## 2016-07-25 LAB — BASIC METABOLIC PANEL
BUN: 15 mg/dL (ref 7–25)
CALCIUM: 9.9 mg/dL (ref 8.6–10.3)
CO2: 23 mmol/L (ref 20–31)
CREATININE: 0.79 mg/dL (ref 0.70–1.25)
Chloride: 102 mmol/L (ref 98–110)
Glucose, Bld: 108 mg/dL — ABNORMAL HIGH (ref 65–99)
Potassium: 3.7 mmol/L (ref 3.5–5.3)
Sodium: 138 mmol/L (ref 135–146)

## 2016-07-25 LAB — LIPID PANEL
Cholesterol: 193 mg/dL (ref <200)
HDL: 39 mg/dL — ABNORMAL LOW (ref >40)
LDL CALC: 109 mg/dL — AB (ref <100)
TRIGLYCERIDES: 224 mg/dL — AB (ref <150)
Total CHOL/HDL Ratio: 4.9 Ratio (ref <5.0)
VLDL: 45 mg/dL — ABNORMAL HIGH (ref <30)

## 2016-07-25 LAB — APTT: APTT: 25 s (ref 22–34)

## 2016-07-25 LAB — PROTIME-INR
INR: 1.1
PROTHROMBIN TIME: 11.2 s (ref 9.0–11.5)

## 2016-07-28 ENCOUNTER — Telehealth: Payer: Self-pay | Admitting: Cardiology

## 2016-07-28 ENCOUNTER — Encounter (HOSPITAL_COMMUNITY): Admission: RE | Payer: Self-pay | Source: Ambulatory Visit

## 2016-07-28 ENCOUNTER — Ambulatory Visit (HOSPITAL_COMMUNITY): Admission: RE | Admit: 2016-07-28 | Payer: Medicare Other | Source: Ambulatory Visit | Admitting: Cardiology

## 2016-07-28 SURGERY — RIGHT/LEFT HEART CATH AND CORONARY ANGIOGRAPHY

## 2016-07-28 NOTE — Telephone Encounter (Signed)
Pt was scheduled for Cath this morning,he had to cancel because his blood pressure was going up and down. Pt said this started a few days ago,last night he had a nose bleed. His blood pressure was 104/70 and that very low for him.It was up and down all last night and this morning.

## 2016-07-28 NOTE — Telephone Encounter (Signed)
Spoke to patient. Recounts that he's had a lot of BP fluctuations recently. Was elevated in office 2/14 (180/100), since then he actually had a drop in his BP including some low readings which concerned him. As low as 100/42, 104/48 which pt voiced he was very symptomatic with (fatigued, weak). Today his BPs have varied from 120/66 to 151/77 (higher readings after taking garbage to curb).  Also noted episode of epistaxis last night which resolved w minimal intervention.  Lastly, voiced wife had a rought night last night, notes she's a cancer patient and has been having some concerns - he didn't feel she could drive him to cath this AM, and with his BP fluctuations he wanted to get Dr. Allison Quarry advice before pursuing this.  He was very apologetic for the missed procedure. He's aware I'll seek some guidance on this from Dr. Ellyn Hack and follow up.

## 2016-07-29 MED ORDER — SULFAMETHOXAZOLE-TRIMETHOPRIM 800-160 MG PO TABS: 1.0000 | ORAL_TABLET | Freq: Two times a day (BID) | ORAL | 0 refills | Status: DC

## 2016-07-29 NOTE — Telephone Encounter (Signed)
Returned call to patient to communicate advice. He'll stop hydralazine and is aware that as a result his baseline BPs may run higher, but that we'd rather he run higher than too low. Aware that we can address the high fluctuations better once his baseline BP is more in normal range.  Pt shares w me that he's dealing w intestinal blockage and a lung infection for which he's taking antibiotics. "As soon as they get everything quieted down we'll go from there" as far as rescheduling cath.  I've asked patient to update [place] in a few days as far as his BP trends go, to check and record these twice a day for now but note any associated symptoms also.

## 2016-07-29 NOTE — Telephone Encounter (Signed)
Started here that his wife is not doing well. Blood pressures in the 120-1 50 mmHg range don't bother me. If his blood pressure is low in the morning he should just hold his antihypertensive medicines for a few hours until he gets back up and then take them. If it means taking a half a dose of carvedilol that would be fine.  Blood pressure fluctuations are contributing issue that he has and it makes it very difficult first to manage. I don't over treat. At this point think wished to simply stop his hydralazine.  As for his cardiac catheterization, whenever he feels up to it and ready we can get him back rescheduled.  [name], MD

## 2016-07-30 ENCOUNTER — Telehealth: Payer: Self-pay | Admitting: Internal Medicine

## 2016-07-30 MED ORDER — SULFAMETHOXAZOLE-TRIMETHOPRIM 800-160 MG PO TABS: 1.0000 | ORAL_TABLET | Freq: Two times a day (BID) | ORAL | 0 refills | Status: DC

## 2016-07-30 NOTE — Telephone Encounter (Signed)
OK - will stand by.  [name], MD

## 2016-07-30 NOTE — Telephone Encounter (Signed)
Spoke with pt. He needs to have his Bactrim sent to a different pharmacy. This has been taken care of. Nothing further was needed.

## 2016-07-31 ENCOUNTER — Other Ambulatory Visit: Payer: Self-pay | Admitting: Surgery

## 2016-07-31 ENCOUNTER — Ambulatory Visit
Admission: RE | Admit: 2016-07-31 | Discharge: 2016-07-31 | Disposition: A | Discharge status: Home / Self Care | Payer: Medicare Other | Source: Ambulatory Visit | Attending: Surgery | Admitting: Surgery

## 2016-07-31 ENCOUNTER — Telehealth: Payer: Self-pay | Admitting: Family Medicine

## 2016-07-31 ENCOUNTER — Encounter: Payer: Medicare Other | Admitting: Family Medicine

## 2016-07-31 DIAGNOSIS — Z8719 Personal history of other diseases of the digestive system: Secondary | ICD-10-CM

## 2016-07-31 DIAGNOSIS — R059 Cough, unspecified: Secondary | ICD-10-CM

## 2016-07-31 DIAGNOSIS — R05 Cough: Secondary | ICD-10-CM

## 2016-07-31 DIAGNOSIS — Z8709 Personal history of other diseases of the respiratory system: Secondary | ICD-10-CM

## 2016-07-31 NOTE — Telephone Encounter (Signed)
[place] Patient Name: [name] DOB: 05-29-1951 Initial Comment Caller states he has problems with heart and lungs, catherization supposed to be done this week, aspirating, supposed to start antibiotics today. Now he is getting really bloated. He is wondering about x-ray capabilities, abdominal pains, watery stool, scar tissue around small bowels. Wife has 3pm appt, and he is wondering about being seen himself. Nurse Assessment Nurse: Dimas Chyle, RN, Dellis Filbert Date/Time Eilene Ghazi Time): 07/31/2016 11:13:28 AM Confirm and document reason for call. If symptomatic, describe symptoms. ---Caller states he has problems with heart and lungs, catherization supposed to be done this week, aspirating, supposed to start antibiotics today. Now he is getting really bloated. He is wondering about x-ray capabilities, abdominal pains, watery stool, scar tissue around small bowels. Wife has 3pm appt, and he is wondering about being seen himself. Abdominal pain started yesterday. Does the patient have any new or worsening symptoms? ---Yes Will a triage be completed? ---Yes Related visit to physician within the last 2 weeks? ---Yes Does the PT have any chronic conditions? (i.e. diabetes, asthma, etc.) ---Yes List chronic conditions. ---Hx of bowel surgery, CAD, HTN Is this a behavioral health or substance abuse call? ---No Guidelines Guideline Title Affirmed Question Affirmed Notes Abdominal Pain - Male [1] MILD-MODERATE pain AND [2] constant AND [3] present > 2 hours Final Disposition User See Physician within 4 Hours (or PCP triage) Dimas Chyle, RN, Dellis Filbert Referrals [place] UNDECIDED Disagree/Comply: Leta Baptist

## 2016-07-31 NOTE — Telephone Encounter (Signed)
Noted. Thanks.

## 2016-07-31 NOTE — Telephone Encounter (Signed)
I spoke with pt and he spoke with his surgeon and surgeon has ordered CXR and abdominal xray and then will decide what pt needs to do. Pt is upset that could not be seen at [facility] today. FYI to Dr Darnell Level.

## 2016-07-31 NOTE — Telephone Encounter (Signed)
Spoke with patient. He was upset that he was told to go to the ER instead of being seen here for a bowel obstruction. He wanted to know what the point of having a GP was, if he can't ever seen them. I advised that if he did have a bowel obstruction, then the advice he received was appropriate and he needed to be evaluated at the hospital. He said he wanted to be told by a doctor or nurse that he needed to go there after being seen in the office, not over the phone. I advised him that we were trying to save him time for treatment by eliminating the step of coming here for [place] to send him to the hospital anyway because based on his symptoms, the ER was the most appropriate place for him to receive care. He said he didn't care about that. He wanted to come here to be told to go there. He said he had to call his surgeon to get orders to get an abdominal and chest xray and was going to [place] imaging for that today. I relayed Dr. Synthia Innocent message about being glad he was getting xrays and sorry about not feeling well. He said Kristeen Miss was supposed to have Erin call him a couple weeks ago, and she never did, so he was just mad and may or not be back.

## 2016-07-31 NOTE — Telephone Encounter (Signed)
Spoke to patient.  Patient states he may need to have abdominal  Surgery in the near futre- going to xray today.RN informed patient may need to have the cardiac cath prior to surgery if it's not emergency.  Patient aware to contact office to finalized right and left heart cath awaiting return call patient states he will have Dr Harlow Asa CONTACT Dr Ellyn Hack

## 2016-07-31 NOTE — Telephone Encounter (Signed)
I'm sorry patient is not feeling well and I'm sorry he had to cancel catheterization.  Multiple ongoing complex medical issues and I haven't seen him since 07/2015.  I wish he would have let me know sooner he was not feeling well over the past 1+ week and we would have seen him this week.  I see he cancelled wife's appt this afternoon.  I'm glad surgeon has scheduled imaging studies and hope he starts feeling better soon.  If desired, schedule f/u visit when I return as I will be out of town over the next 1.5 weeks. If acutely worse over weekend, recommend sooner evaluation.   Back in December he was upset I did not prescribe morphine narcotic for him over the phone even though he is under pain contract with another physician for this.

## 2016-08-04 ENCOUNTER — Ambulatory Visit: Payer: Medicare Other | Admitting: Cardiology

## 2016-08-12 ENCOUNTER — Telehealth: Payer: Self-pay | Admitting: Cardiology

## 2016-08-12 NOTE — Telephone Encounter (Signed)
New message    Pt is calling about rescheduling his heart cath appt.

## 2016-08-12 NOTE — Telephone Encounter (Signed)
CALLED AND SPOKE TO WIFE ( DPI)  WIFE STATES PATIENT HAS GONE TO DOCTOR VISIT. INFORMED WIFE THAT PATIENT NEEDS AN OFFICE APPOINTMENT TO DO CURRENT H&P FOR RIGHT AND LEFT HEART CATH. PAPOINTMENT SCHEDULE FOR 08/24/16 WITH BRITTANY PA AT 1:30 PM. IF PATIENT NEEDS TO CALL AAND CHANGE APPOINTMENT ,HE MAY DO SO.  WIFE STATES UNDERSTANDING.

## 2016-08-23 NOTE — Progress Notes (Deleted)
Cardiology Office Note    Date:  08/23/2016   ID:  [name], DOB [birth year], MRN [id number]  PCP:  Ria Bush, MD  Cardiologist:  ***   No chief complaint on file.   History of Present Illness:    [name] is a 66 y.o. male with past medical history of HTN, HLD, COPD, and mild AS who presents to the office today for ***  He was seen by Dr. Ellyn Hack on 07/15/2016 and reported having episodes of chest pain and dyspnea on exertion. A right/left cardiac catheterization was recommended for definitive evaluation as preoperative cardiac evaluation for an upcoming knee surgery and to assess pulmonary pressures. He developed fevers the weekend prior to his cath which initially caused a delay in the procedure. This was rescheduled on 2/27 but he reported labile BP readings with associated weakness and fatigue.  This was communicated to Dr. Ellyn Hack who recommended stopping Hydralazine.     Past Medical History:  Diagnosis Date  . Anemia   . Asthma   . Back pain, chronic, followed at pain clinic 11/25/2011  . Carotid artery stenosis 09/06/2014  . Chest pain with low risk for cardiac etiology 10/2011   Non-obstructive CAD by Cath; negative Lexiscan in 08/2011  . Clotting disorder (HCC)    LLE DVT (also small vessel)  . Colon polyps   . COPD (chronic obstructive pulmonary disease) ([place])   . Dementia    Mild  . Depression   . Dyslipidemia (high LDL; low HDL)    statin intolerant; on fibrate  . Edema leg    Wears compression stockings; 08/2012 dopplers - no DVT or thrombophlebitis; mild R Popliteal V reflux - no VNUS ablation candidate  . Fainting   . Family history of acute myocardial infarction. and premature CAD 11/25/2011  . GERD (gastroesophageal reflux disease)   . Hearing loss   . History of diabetes mellitus   . Hypertension    very labile  . Mild aortic stenosis by prior echocardiogram 03/2012   moderate sclerosis  . Nephrolithiasis   . Osteoarthritis   . Other  idiopathic peripheral autonomic neuropathy    Agent orange  . PTSD (post-traumatic stress disorder) 07/24/2015  . Seizure ([place])   . Small bowel obstruction 1990s, 2001, 2015   s/p multiple bowel surgeries; from war wounds  . Stroke ([place])   . Weakness     Past Surgical History:  Procedure Laterality Date  . APPENDECTOMY  1958   per patient  . CARDIOVASCULAR STRESS TEST  08/2011   Negative lexiscan myoview  . Carotid Dopplers  01/2016   Stable. RICA - slight progression from<40% to 40-59%.  Otherwise stable bilateral carotids and subclavian arteries. Stable LICA  . COLONOSCOPY    . exploratory laparotomy with extensive lysis of adhesions  10/1999  . JOINT REPLACEMENT     right knee replaced 2x  . LEFT HEART CATHETERIZATION WITH CORONARY ANGIOGRAM N/A 11/26/2011   WNL Lorretta Harp, MD  . small bowel obstruction  1996, 1999, 2001, 2015  . TONSILLECTOMY  1960   per patient  . TOTAL KNEE ARTHROPLASTY    . TRANSTHORACIC ECHOCARDIOGRAM  03/24/2016   Mild LVH. EF 60-65%. GR 1 DD. Severely restricted calcified noncoronary cusp of the aortic valve. Mild aortic stenosis (mean gradient 17 mmHg) with mild regurgitation.  . TRANSTHORACIC ECHOCARDIOGRAM  2012; 03/2012   a. Essentially normal wirth moderate concentric LVH and moderate aortic sclerosis; b. Mild concentric LVH. EF greater than 55%.  Mild aortic stenosis. Grade 1 diastolic dysfunction    Current Medications: Outpatient Medications Prior to Visit  Medication Sig Dispense Refill  . albuterol (PROVENTIL HFA) 108 (90 BASE) MCG/ACT inhaler Inhale 2 puffs into the lungs every 4 (four) hours as needed. For wheezing and/or shortness of breath 3 Inhaler 4  . albuterol (PROVENTIL) (2.5 MG/3ML) 0.083% nebulizer solution Take 3 mLs (2.5 mg total) by nebulization every 6 (six) hours as needed for wheezing or shortness of breath. 75 mL 11  . ALPRAZolam (XANAX) 1 MG tablet Take 1 tablet (1 mg total) by mouth 3 (three) times daily as needed for  anxiety or sleep. 90 tablet 0  . amoxicillin (AMOXIL) 500 MG tablet Take 1 tablet (500 mg total) by mouth 2 (two) times daily. 20 tablet 0  . aspirin 81 MG chewable tablet Chew 81 mg by mouth daily.    . budesonide-formoterol (SYMBICORT) 160-4.5 MCG/ACT inhaler 2 puffs then rinse mouth well, twice daily 3 Inhaler 3  . carvedilol (COREG) 25 MG tablet TAKE 1 TABLET (25 MG TOTAL) BY MOUTH TWICE DAILY. 180 tablet 3  . cholecalciferol (VITAMIN D) 1000 units tablet Take 1,000 Units by mouth daily.    . furosemide (LASIX) 20 MG tablet Take 1 tablet (20 mg total) by mouth daily. MAY TAKE ADDITIONAL TABLET AS NEEDED SWELLING OR SHORTNESS OF BREATH 180 tablet 3  . irbesartan-hydrochlorothiazide (AVALIDE) 300-12.5 MG tablet TAKE 1 TABLET BY MOUTH DAILY. 30 tablet 11  . isosorbide mononitrate (IMDUR) 30 MG 24 hr tablet Take 1 tablet (30 mg total) by mouth daily. TAKE IN THE MORNING WITH HYDRALAZINE 90 tablet 3  . magnesium hydroxide (MILK OF MAGNESIA) 400 MG/5ML suspension Take 15 mLs by mouth 3 (three) times daily as needed for mild constipation or indigestion.    [name] morphine (MSIR) 30 MG tablet Take 1 tablet (30 mg total) by mouth every 4 (four) hours as needed. FOR PAIN 120 tablet 0  . Multiple Vitamins-Minerals (CENTRUM SILVER PO) Take 1 capsule by mouth daily.    . nitroGLYCERIN (NITROSTAT) 0.4 MG SL tablet Place 1 tablet (0.4 mg total) under the tongue every 5 (five) minutes as needed for chest pain. 25 tablet PRN  . polyethylene glycol (MIRALAX / GLYCOLAX) packet Take 17 g by mouth 2 (two) times daily as needed for moderate constipation.    . predniSONE (DELTASONE) 20 MG tablet Take 60 mg ( 3 tablets ) at 6 pm and bedtime day prior to procedure and the morning of procedure and the afternoon of procedure. 12 tablet 0  . promethazine (PHENERGAN) 25 MG tablet Take 1 tablet (25 mg total) by mouth every 6 (six) hours as needed. FOR NAUSEA 90 tablet 0  . Respiratory Therapy Supplies (FLUTTER) DEVI Use as  directed 1 each 0  . sulfamethoxazole-trimethoprim (BACTRIM DS,SEPTRA DS) 800-160 MG tablet Take 1 tablet by mouth 2 (two) times daily. 20 tablet 0  . vitamin E (VITAMIN E) 400 UNIT capsule Take 400 Units by mouth daily.     No facility-administered medications prior to visit.      Allergies:   Ivp dye [iodinated diagnostic agents]; Oxycodone hcl; Atorvastatin; Bystolic [nebivolol hcl]; Codeine; Doxycycline; Iohexol; Levaquin [levofloxacin]; Methocarbamol; Pentazocine lactate; Succinylcholine chloride; Synvisc [hylan g-f 20]; Butorphanol tartrate; and Dilaudid [hydromorphone hcl]   Social History   Social History  . Marital status: Married    Spouse name: N/A  . Number of children: 3  . Years of education: N/A   Occupational History  . Disabled  Social History Main Topics  . Smoking status: Never Smoker  . Smokeless tobacco: Never Used  . Alcohol use No  . Drug use: No  . Sexual activity: Yes   Other Topics Concern  . Not on file   Social History Narrative   He is a father of 24, grandfather 28.    One of his sons was killed during the war in [place]. Grandson killed bike accident 2017.   He was exposed to [employer].   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   Not very physically active due to extreme disability/debilitation from osteoarthritis and peripheral neuropathy.     Family History:  The patient's ***family history includes Heart attack in his brother, father, mother, sister, and son; Heart disease in his father and mother; Hypertension in his brother and brother; Non-Hodgkin's lymphoma in his daughter; Sleep apnea in his son; Stroke in his mother and son.   Review of Systems:   Please see the history of present illness.     General:  No chills, fever, night sweats or weight changes.  Cardiovascular:  No chest pain, dyspnea on exertion, edema, orthopnea, palpitations, paroxysmal nocturnal  dyspnea. Dermatological: No rash, lesions/masses Respiratory: No cough, dyspnea Urologic: No hematuria, dysuria Abdominal:   No nausea, vomiting, diarrhea, bright red blood per rectum, melena, or hematemesis Neurologic:  No visual changes, wkns, changes in mental status. All other systems reviewed and are otherwise negative except as noted above.   Physical Exam:    VS:  There were no vitals taken for this visit.   General: Well developed, well nourished,male appearing in no acute distress. Head: Normocephalic, atraumatic, sclera non-icteric, no xanthomas, nares are without discharge.  Neck: No carotid bruits. JVD not elevated.  Lungs: Respirations regular and unlabored, without wheezes or rales.  Heart: ***Regular rate and rhythm. No S3 or S4.  No murmur, no rubs, or gallops appreciated. Abdomen: Soft, non-tender, non-distended with normoactive bowel sounds. No hepatomegaly. No rebound/guarding. No obvious abdominal masses. Msk:  Strength and tone appear normal for age. No joint deformities or effusions. Extremities: No clubbing or cyanosis. No edema.  Distal pedal pulses are 2+ bilaterally. Neuro: Alert and oriented X 3. Moves all extremities spontaneously. No focal deficits noted. Psych:  Responds to questions appropriately with a normal affect. Skin: No rashes or lesions noted  Wt Readings from Last 3 Encounters:  07/15/16 236 lb (107 kg)  07/08/16 232 lb (105.2 kg)  05/28/16 241 lb 9.6 oz (109.6 kg)        Studies/Labs Reviewed:   EKG:  EKG is*** ordered today.  The ekg ordered today demonstrates ***  Recent Labs: 07/24/2016: BUN 15; Creat 0.79; Hemoglobin 14.1; Platelets 223; Potassium 3.7; Sodium 138   Lipid Panel    Component Value Date/Time   CHOL 193 07/24/2016 1610   CHOL 173 12/06/2012 1627   TRIG 224 (H) 07/24/2016 1610   TRIG 318 (H) 12/06/2012 1627   HDL 39 (L) 07/24/2016 1610   HDL 34 (L) 12/06/2012 1627   CHOLHDL 4.9 07/24/2016 1610   VLDL 45 (H)  07/24/2016 1610   LDLCALC 109 (H) 07/24/2016 1610   LDLCALC 75 12/06/2012 1627   LDLDIRECT 66.4 02/02/2007 0936    Additional studies/ records that were reviewed today include:  ***  Assessment:    No diagnosis found.   Plan:   In order of problems listed above:  1. ***    Medication Adjustments/Labs  and Tests Ordered: Current medicines are reviewed at length with the patient today.  Concerns regarding medicines are outlined above.  Medication changes, Labs and Tests ordered today are listed in the Patient Instructions below. There are no Patient Instructions on file for this visit.   Signed, Erma Heritage, PA  08/23/2016 4:20 PM    [facility] [place], [place], [place]  [address] Phone: [phone]; Fax: [phone]  [address] [place], [place] [address] Phone: [phone]

## 2016-08-24 ENCOUNTER — Ambulatory Visit: Payer: Medicare Other | Admitting: Student
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
## 2016-08-31 ENCOUNTER — Ambulatory Visit (INDEPENDENT_AMBULATORY_CARE_PROVIDER_SITE_OTHER): Payer: Medicare Other | Admitting: Student

## 2016-08-31 ENCOUNTER — Encounter: Payer: Self-pay | Admitting: Student

## 2016-08-31 ENCOUNTER — Telehealth: Payer: Self-pay | Admitting: Student

## 2016-08-31 VITALS — BP 156/85 | HR 70 | Ht 72.0 in | Wt 236.0 lb

## 2016-08-31 DIAGNOSIS — I200 Unstable angina: Secondary | ICD-10-CM

## 2016-08-31 DIAGNOSIS — J449 Chronic obstructive pulmonary disease, unspecified: Secondary | ICD-10-CM

## 2016-08-31 DIAGNOSIS — I10 Essential (primary) hypertension: Secondary | ICD-10-CM | POA: Diagnosis not present

## 2016-08-31 DIAGNOSIS — E785 Hyperlipidemia, unspecified: Secondary | ICD-10-CM

## 2016-08-31 LAB — AFB CULTURE WITH SMEAR (NOT AT ARMC)

## 2016-08-31 MED ORDER — PREDNISONE 20 MG PO TABS: ORAL_TABLET | ORAL | 0 refills | Status: DC

## 2016-08-31 NOTE — Patient Instructions (Signed)
   [facility] [address] [place] [address] Dept: [phone] Loc: [phone]  [name]  08/31/2016  You are scheduled for a Right and Left Cardiac Catheterization on Wednesday, April 11 with Dr. [name]. (Latest Outpatient appt time).  1. Please arrive at the [facility] (Main Entrance A) at [facility]: [address] [place], [place] [address] at      (two hours before your procedure to ensure your preparation). Free valet parking service is available.   Special note: Every effort is made to have your procedure done on time. Please understand that emergencies sometimes delay scheduled procedures.  2. Diet: Do not eat or drink anything after midnight prior to your procedure except sips of water to take medications.  3. Labs: If you are unable to obtain your lab work from the [place], please call to let us know [phone]) so we can order labs to be drawn.  4. Medication instructions in preparation for your procedure:  On the morning of your procedure, take your Aspirin and any morning medicines NOT listed above.  You may use sips of water.  5. Plan for one night stay--bring personal belongings. 6. Bring a current list of your medications and current insurance cards. 7. You MUST have a responsible person to drive you home. 8. Someone MUST be with you the first 24 hours after you arrive home or your discharge will be delayed. 9. Please wear clothes that are easy to get on and off and wear slip-on shoes.  Thank you for allowing [place] to care for you!   -- [place] Invasive Cardiovascular services
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
## 2016-09-08 ENCOUNTER — Telehealth: Payer: Self-pay | Admitting: Internal Medicine

## 2016-09-08 NOTE — Telephone Encounter (Signed)
Pt calling to let Dr Annamaria Boots know that he is scheduled for Heart Cath and lung pressure check with Dr Ellyn Hack 09/09/16.  Pt calling to see if there was anything additional that Dr Annamaria Boots wants them to do while he is admitted for these tests - anything in addition to lung pressures? Please advise Dr Annamaria Boots. Thanks.

## 2016-09-08 NOTE — Telephone Encounter (Signed)
It would be great if we were able to get a full PFT for dx COPD mixed type. I don't know if that could be coordinated with a heart cath- might have to wait.

## 2016-09-08 NOTE — Telephone Encounter (Signed)
called and spoke with pt and he is aware of CY recs and pt stated that he will mention this to Dr. Ellyn Hack tomorrow to see if they will be able to do the PFT while he is in the hospital doing the cath.

## 2016-09-09 ENCOUNTER — Encounter (HOSPITAL_COMMUNITY): Admission: RE | Payer: Self-pay | Source: Ambulatory Visit

## 2016-09-09 ENCOUNTER — Ambulatory Visit (HOSPITAL_COMMUNITY): Admission: RE | Admit: 2016-09-09 | Payer: Medicare Other | Source: Ambulatory Visit | Admitting: Cardiology

## 2016-09-09 ENCOUNTER — Telehealth: Payer: Self-pay | Admitting: Student

## 2016-09-09 SURGERY — RIGHT/LEFT HEART CATH AND CORONARY ANGIOGRAPHY
Anesthesia: LOCAL

## 2016-09-09 NOTE — Telephone Encounter (Signed)
Cath re-scheduled for 09-14-16 12pm with Dr Ellyn Hack

## 2016-09-09 NOTE — Telephone Encounter (Signed)
New message    pt verbalized that he is calling to speak to rn   About cath procedure not scheduled at hospital
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
## 2016-09-10 ENCOUNTER — Other Ambulatory Visit: Payer: Self-pay | Admitting: *Deleted

## 2016-09-10 DIAGNOSIS — D689 Coagulation defect, unspecified: Secondary | ICD-10-CM

## 2016-09-10 DIAGNOSIS — Z01810 Encounter for preprocedural cardiovascular examination: Secondary | ICD-10-CM

## 2016-09-10 DIAGNOSIS — I200 Unstable angina: Secondary | ICD-10-CM

## 2016-09-10 DIAGNOSIS — Z01818 Encounter for other preprocedural examination: Secondary | ICD-10-CM

## 2016-09-14 ENCOUNTER — Encounter (HOSPITAL_COMMUNITY): Admission: RE | Payer: Self-pay | Source: Ambulatory Visit

## 2016-09-14 ENCOUNTER — Telehealth: Payer: Self-pay | Admitting: *Deleted

## 2016-09-14 ENCOUNTER — Ambulatory Visit (HOSPITAL_COMMUNITY): Admission: RE | Admit: 2016-09-14 | Payer: Medicare Other | Source: Ambulatory Visit | Admitting: Cardiology

## 2016-09-14 SURGERY — RIGHT/LEFT HEART CATH AND CORONARY ANGIOGRAPHY
Anesthesia: LOCAL
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
## 2016-09-25 ENCOUNTER — Telehealth: Payer: Self-pay | Admitting: Cardiology

## 2016-09-25 DIAGNOSIS — I200 Unstable angina: Secondary | ICD-10-CM
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
## 2016-09-25 NOTE — Telephone Encounter (Signed)
Left message to call back, per DPR. 

## 2016-09-28 ENCOUNTER — Ambulatory Visit: Payer: Medicare Other | Admitting: Internal Medicine

## 2016-09-29 ENCOUNTER — Telehealth: Payer: Self-pay | Admitting: *Deleted

## 2016-09-29 NOTE — Telephone Encounter (Signed)
Pt returning your call
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
## 2016-09-29 NOTE — Telephone Encounter (Signed)
We will try to reschedule for later this week.  He is out of range for H&P purposes. I think we need to clarify the nature of the closed head injury though. I had originally given the OK for Friday - but need to clarify details of his injury first.  Another concern is that continued delaying of this test would suggest that his cardiac needs are not foremost in his mind & concerns.  [name], MD
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
## 2016-09-29 NOTE — Telephone Encounter (Signed)
Spoke to patient, patient unable to talk at present would like for RN  To call back in 40-60 min. RN verbalized understanding.

## 2016-09-30 NOTE — Telephone Encounter (Signed)
Spoke with patient and he would like to schedule cath on May 10 Spoke with [name] at the cath lab and scheduled for 09/08/16 at 12 noon with Dr Ellyn Hack  Patient aware of date ant time, lab orders placed

## 2016-09-30 NOTE — Telephone Encounter (Signed)
New Message   pt verbalized that he is returning call for rn   To schedule Cath

## 2016-10-01 NOTE — Telephone Encounter (Signed)
Spoke to patient. Recommendation from DR [facility] is to have patient be re -evaluate patient face to face prior to upcoming cath.    Patient aware - cancelling 10/08/16 right and left heart cath. Patient schedule to see EXTENDER on 10/15/16  at 2 pm. pateint verbalized understanding. Patient also aware not to labs until directed at appointment.

## 2016-10-02 ENCOUNTER — Telehealth: Payer: Self-pay | Admitting: Internal Medicine

## 2016-10-02 MED ORDER — SULFAMETHOXAZOLE-TRIMETHOPRIM 800-160 MG PO TABS: 1.0000 | ORAL_TABLET | Freq: Two times a day (BID) | ORAL | 0 refills | Status: DC

## 2016-10-02 NOTE — Telephone Encounter (Signed)
Pt reports of prod cough with yellow mucus, wheezing, sweats, chest tightness & chest congestion x1d Denies any fever or chills.  Pt taken neb treatments q4h & mucinex q8h with mild improvement.  Pt is requesting Rx for abx.   CY please advise. Thanks.  Current Outpatient Prescriptions on File Prior to Visit  Medication Sig Dispense Refill  . albuterol (PROVENTIL HFA) 108 (90 BASE) MCG/ACT inhaler Inhale 2 puffs into the lungs every 4 (four) hours as needed. For wheezing and/or shortness of breath 3 Inhaler 4  . albuterol (PROVENTIL) (2.5 MG/3ML) 0.083% nebulizer solution Take 3 mLs (2.5 mg total) by nebulization every 6 (six) hours as needed for wheezing or shortness of breath. 75 mL 11  . ALPRAZolam (XANAX) 1 MG tablet Take 1 tablet (1 mg total) by mouth 3 (three) times daily as needed for anxiety or sleep. (Patient taking differently: Take 1 mg by mouth every 8 (eight) hours as needed for anxiety or sleep. ) 90 tablet 0  . aspirin 81 MG chewable tablet Chew 81 mg by mouth daily.    . budesonide-formoterol (SYMBICORT) 160-4.5 MCG/ACT inhaler 2 puffs then rinse mouth well, twice daily 3 Inhaler 3  . carvedilol (COREG) 25 MG tablet TAKE 1 TABLET (25 MG TOTAL) BY MOUTH TWICE DAILY. 180 tablet 3  . cholecalciferol (VITAMIN D) 1000 units tablet Take 1,000 Units by mouth daily.    . furosemide (LASIX) 20 MG tablet Take 1 tablet (20 mg total) by mouth daily. MAY TAKE ADDITIONAL TABLET AS NEEDED SWELLING OR SHORTNESS OF BREATH 180 tablet 3  . irbesartan-hydrochlorothiazide (AVALIDE) 300-12.5 MG tablet TAKE 1 TABLET BY MOUTH DAILY. 30 tablet 11  . isosorbide mononitrate (IMDUR) 30 MG 24 hr tablet Take 1 tablet (30 mg total) by mouth daily. TAKE IN THE MORNING WITH HYDRALAZINE (Patient taking differently: Take 30 mg by mouth daily. ) 90 tablet 3  . magnesium hydroxide (MILK OF MAGNESIA) 400 MG/5ML suspension Take 15 mLs by mouth 3 (three) times daily as needed for mild constipation or indigestion.    [name]  morphine (MSIR) 30 MG tablet Take 1 tablet (30 mg total) by mouth every 4 (four) hours as needed. FOR PAIN (Patient taking differently: Take 30 mg by mouth every 4 (four) hours as needed for moderate pain. ) 120 tablet 0  . Multiple Vitamins-Minerals (CENTRUM SILVER PO) Take 1 capsule by mouth daily.    Melynda Ripple Tosylate (SYMPROIC) 0.2 MG TABS Take 0.2 tablets by mouth daily.    . nitroGLYCERIN (NITROSTAT) 0.4 MG SL tablet Place 1 tablet (0.4 mg total) under the tongue every 5 (five) minutes as needed for chest pain. 25 tablet PRN  . polyethylene glycol (MIRALAX / GLYCOLAX) packet Take 17 g by mouth 2 (two) times daily.     . predniSONE (DELTASONE) 20 MG tablet Take 60 mg ( 3 tablets ) at 6 pm and bedtime day prior to procedure and the morning of procedure and the afternoon of procedure. 12 tablet 0  . promethazine (PHENERGAN) 25 MG tablet Take 1 tablet (25 mg total) by mouth every 6 (six) hours as needed. FOR NAUSEA (Patient taking differently: Take 25 mg by mouth every 6 (six) hours as needed for nausea or vomiting. FOR NAUSEA) 90 tablet 0  . Respiratory Therapy Supplies (FLUTTER) DEVI Use as directed 1 each 0  . vitamin E (VITAMIN E) 400 UNIT capsule Take 400 Units by mouth daily.     No current facility-administered medications on file prior to visit.  Allergies  Allergen Reactions  . Bee Venom Anaphylaxis  . Codeine Shortness Of Breath and Rash  . Doxycycline Shortness Of Breath and Rash  . Ivp Dye [Iodinated Diagnostic Agents]     Omni - Paque Contrast   . Levaquin [Levofloxacin] Hives and Shortness Of Breath  . Oxycodone Hcl Shortness Of Breath, Swelling and Rash  . Atorvastatin     myalgias  . Bystolic [Nebivolol Hcl]     Nightmares, flashbacks  . Iohexol      Desc: hives,neck and torso erythemia   . Methocarbamol     rash  . Pentazocine Lactate     rash  . Succinylcholine Chloride Other (See Comments)    unknown  . Synvisc [Hylan G-F 20]     rash  . Butorphanol  Tartrate Rash  . Dilaudid [Hydromorphone Hcl] Nausea Only and Rash    Aggressive

## 2016-10-02 NOTE — Telephone Encounter (Signed)
Offer septra DS, # 14, 1 twice daily

## 2016-10-02 NOTE — Telephone Encounter (Signed)
Rx sent to preferred pharmacy. Pt aware and voiced his understanding. Nothing further needed.  

## 2016-10-08 ENCOUNTER — Ambulatory Visit (HOSPITAL_COMMUNITY): Admit: 2016-10-08 | Payer: Medicare Other | Admitting: Cardiology

## 2016-10-08 ENCOUNTER — Encounter (HOSPITAL_COMMUNITY): Payer: Self-pay

## 2016-10-08 SURGERY — RIGHT/LEFT HEART CATH AND CORONARY ANGIOGRAPHY
Anesthesia: LOCAL

## 2016-10-15 ENCOUNTER — Ambulatory Visit (INDEPENDENT_AMBULATORY_CARE_PROVIDER_SITE_OTHER): Payer: Medicare Other | Admitting: Cardiology

## 2016-10-15 ENCOUNTER — Encounter: Payer: Self-pay | Admitting: Cardiology

## 2016-10-15 VITALS — BP 168/88 | HR 70 | Ht 73.5 in | Wt 233.2 lb

## 2016-10-15 DIAGNOSIS — Z91041 Radiographic dye allergy status: Secondary | ICD-10-CM | POA: Insufficient documentation

## 2016-10-15 DIAGNOSIS — R079 Chest pain, unspecified: Secondary | ICD-10-CM

## 2016-10-15 DIAGNOSIS — G9009 Other idiopathic peripheral autonomic neuropathy: Secondary | ICD-10-CM | POA: Diagnosis not present

## 2016-10-15 DIAGNOSIS — Z01818 Encounter for other preprocedural examination: Secondary | ICD-10-CM | POA: Diagnosis not present

## 2016-10-15 DIAGNOSIS — I10 Essential (primary) hypertension: Secondary | ICD-10-CM | POA: Diagnosis not present

## 2016-10-15 DIAGNOSIS — I200 Unstable angina: Secondary | ICD-10-CM | POA: Diagnosis not present

## 2016-10-15 DIAGNOSIS — J449 Chronic obstructive pulmonary disease, unspecified: Secondary | ICD-10-CM

## 2016-10-15 DIAGNOSIS — I350 Nonrheumatic aortic (valve) stenosis: Secondary | ICD-10-CM

## 2016-10-15 MED ORDER — PREDNISONE 20 MG PO TABS: 20.0000 mg | ORAL_TABLET | Freq: Every day | ORAL | 0 refills | Status: DC

## 2016-10-15 MED ORDER — FAMOTIDINE 20 MG PO TABS: 20.0000 mg | ORAL_TABLET | Freq: Every day | ORAL | Status: DC

## 2016-10-15 MED ORDER — NITROGLYCERIN 0.4 MG SL SUBL: 0.4000 mg | SUBLINGUAL_TABLET | SUBLINGUAL | 2 refills | Status: DC | PRN
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
## 2016-10-15 NOTE — Assessment & Plan Note (Signed)
He will need pre medication

## 2016-10-15 NOTE — Assessment & Plan Note (Signed)
Mild AS-echo Oct 2017

## 2016-10-15 NOTE — Assessment & Plan Note (Signed)
H/o lability

## 2016-10-15 NOTE — Assessment & Plan Note (Signed)
Intermittent amnesia episodes, PTSD

## 2016-10-15 NOTE — Assessment & Plan Note (Signed)
Has had Cath in 10/2011 & Myoview in  08/2011 - non-ischemic

## 2016-10-15 NOTE — Assessment & Plan Note (Signed)
Followed by Dr  Annamaria Boots. Recent bout of bronchitis

## 2016-10-15 NOTE — Assessment & Plan Note (Signed)
?  agent orange related vs diabetic. Followed by pain clinic

## 2016-10-15 NOTE — Patient Instructions (Addendum)
Medication Instructions:  HOLD IRBESARTAN THE DAY BEFORE 10-21-16 AND THE DAY OF THE PROCEDURE 10-22-16 PREDNISONE 6 TAB (120MG ) TAB AT HS 10-21-16  PEPCID 1 TAB (20MG ) @ HS ON 10-21-16   If you need a refill on your cardiac medications before your next appointment, please call your pharmacy.  Labwork: CBC, BMP, PT, PTT, TSH TODAY AT [facility] ON THE 1ST FLOOR  Testing/Procedures: Your physician has requested that you have a R/L cardiac catheterization. Cardiac catheterization is used to diagnose and/or treat various heart conditions. Doctors may recommend this procedure for a number of different reasons. The most common reason is to evaluate chest pain. Chest pain can be a symptom of coronary artery disease (CAD), and cardiac catheterization can show whether plaque is narrowing or blocking your heart's arteries. This procedure is also used to evaluate the valves, as well as measure the blood flow and oxygen levels in different parts of your heart. For further information please visit [email or web address]. Please follow instruction sheet, as given.  Thank you for choosing [facility] at [facility]!!       [name] [address] [name] [address] Dept: [phone] Loc: [phone]  [name]  10/15/2016  You are scheduled for a Cardiac Catheterization on Thursday, May 24 with Dr. [name].  1. Please arrive at the [facility] (Main Entrance A) at [facility]: [place], [place] [address] at 8:30 AM (two hours before your procedure to ensure your preparation). Free valet parking service is available.   Special note: Every effort is made to have your procedure done on time. Please understand that emergencies sometimes delay scheduled procedures.  2. Diet: Do not eat or drink anything after midnight prior to your procedure except sips of water to take  medications.  3. Labs: TODAY 10-15-2016-[facility]  4. Medication instructions in preparation for your procedure:  Stop taking, IRBESARTAN-HCTZ on Wednesday, May 23.    On the morning of your procedure, take your morning medicines NOT listed above.  You may use sips of water.  5. Plan for one night stay--bring personal belongings. 6. Bring a current list of your medications and current insurance cards. 7. You MUST have a responsible person to drive you home. 8. Someone MUST be with you the first 24 hours after you arrive home or your discharge will be delayed. 9. Please wear clothes that are easy to get on and off and wear slip-on shoes.  Thank you for allowing [place] to care for you!   -- [facility]

## 2016-10-16 LAB — CBC
HCT: 38 % — ABNORMAL LOW (ref 38.5–50.0)
Hemoglobin: 12.8 g/dL — ABNORMAL LOW (ref 13.2–17.1)
MCH: 29.8 pg (ref 27.0–33.0)
MCHC: 33.7 g/dL (ref 32.0–36.0)
MCV: 88.6 fL (ref 80.0–100.0)
MPV: 9.5 fL (ref 7.5–12.5)
Platelets: 248 10*3/uL (ref 140–400)
RBC: 4.29 MIL/uL (ref 4.20–5.80)
RDW: 14.1 % (ref 11.0–15.0)
WBC: 6 10*3/uL (ref 3.8–10.8)

## 2016-10-16 LAB — BASIC METABOLIC PANEL
BUN: 13 mg/dL (ref 7–25)
CO2: 25 mmol/L (ref 20–31)
Calcium: 9.2 mg/dL (ref 8.6–10.3)
Chloride: 107 mmol/L (ref 98–110)
Creat: 0.9 mg/dL (ref 0.70–1.25)
Glucose, Bld: 94 mg/dL (ref 65–99)
Potassium: 4 mmol/L (ref 3.5–5.3)
Sodium: 140 mmol/L (ref 135–146)

## 2016-10-17 LAB — TSH: TSH: 4.3 mIU/L (ref 0.40–4.50)

## 2016-10-17 LAB — APTT: aPTT: 26 s (ref 22–34)

## 2016-10-17 LAB — PROTIME-INR
INR: 1
Prothrombin Time: 10.3 s (ref 9.0–11.5)

## 2016-10-22 ENCOUNTER — Encounter (HOSPITAL_COMMUNITY)
Admission: RE | Disposition: A | Discharge status: Home / Self Care | Payer: Self-pay | Source: Ambulatory Visit | Attending: Cardiology

## 2016-10-22 ENCOUNTER — Ambulatory Visit (HOSPITAL_COMMUNITY)
Admission: RE | Admit: 2016-10-22 | Discharge: 2016-10-22 | Disposition: A | Discharge status: Home / Self Care | Payer: Medicare Other | Source: Ambulatory Visit | Attending: Cardiology | Admitting: Cardiology

## 2016-10-22 ENCOUNTER — Encounter (HOSPITAL_COMMUNITY): Payer: Self-pay | Admitting: Cardiology

## 2016-10-22 DIAGNOSIS — I6529 Occlusion and stenosis of unspecified carotid artery: Secondary | ICD-10-CM | POA: Diagnosis not present

## 2016-10-22 DIAGNOSIS — K439 Ventral hernia without obstruction or gangrene: Secondary | ICD-10-CM | POA: Diagnosis not present

## 2016-10-22 DIAGNOSIS — Z7982 Long term (current) use of aspirin: Secondary | ICD-10-CM | POA: Diagnosis not present

## 2016-10-22 DIAGNOSIS — Z8249 Family history of ischemic heart disease and other diseases of the circulatory system: Secondary | ICD-10-CM

## 2016-10-22 DIAGNOSIS — M549 Dorsalgia, unspecified: Secondary | ICD-10-CM | POA: Insufficient documentation

## 2016-10-22 DIAGNOSIS — R0600 Dyspnea, unspecified: Secondary | ICD-10-CM | POA: Diagnosis present

## 2016-10-22 DIAGNOSIS — F329 Major depressive disorder, single episode, unspecified: Secondary | ICD-10-CM | POA: Diagnosis not present

## 2016-10-22 DIAGNOSIS — G8929 Other chronic pain: Secondary | ICD-10-CM | POA: Diagnosis not present

## 2016-10-22 DIAGNOSIS — Z7951 Long term (current) use of inhaled steroids: Secondary | ICD-10-CM | POA: Diagnosis not present

## 2016-10-22 DIAGNOSIS — R600 Localized edema: Secondary | ICD-10-CM | POA: Diagnosis present

## 2016-10-22 DIAGNOSIS — E785 Hyperlipidemia, unspecified: Secondary | ICD-10-CM | POA: Insufficient documentation

## 2016-10-22 DIAGNOSIS — F4310 Post-traumatic stress disorder, unspecified: Secondary | ICD-10-CM | POA: Insufficient documentation

## 2016-10-22 DIAGNOSIS — I200 Unstable angina: Secondary | ICD-10-CM | POA: Diagnosis not present

## 2016-10-22 DIAGNOSIS — F0390 Unspecified dementia without behavioral disturbance: Secondary | ICD-10-CM | POA: Diagnosis not present

## 2016-10-22 DIAGNOSIS — E118 Type 2 diabetes mellitus with unspecified complications: Secondary | ICD-10-CM | POA: Diagnosis present

## 2016-10-22 DIAGNOSIS — I350 Nonrheumatic aortic (valve) stenosis: Secondary | ICD-10-CM

## 2016-10-22 DIAGNOSIS — Z8673 Personal history of transient ischemic attack (TIA), and cerebral infarction without residual deficits: Secondary | ICD-10-CM | POA: Insufficient documentation

## 2016-10-22 DIAGNOSIS — I10 Essential (primary) hypertension: Secondary | ICD-10-CM | POA: Diagnosis not present

## 2016-10-22 DIAGNOSIS — M1990 Unspecified osteoarthritis, unspecified site: Secondary | ICD-10-CM | POA: Diagnosis not present

## 2016-10-22 DIAGNOSIS — Z86718 Personal history of other venous thrombosis and embolism: Secondary | ICD-10-CM | POA: Insufficient documentation

## 2016-10-22 DIAGNOSIS — K219 Gastro-esophageal reflux disease without esophagitis: Secondary | ICD-10-CM | POA: Diagnosis not present

## 2016-10-22 DIAGNOSIS — Z91041 Radiographic dye allergy status: Secondary | ICD-10-CM | POA: Insufficient documentation

## 2016-10-22 DIAGNOSIS — J449 Chronic obstructive pulmonary disease, unspecified: Secondary | ICD-10-CM | POA: Diagnosis not present

## 2016-10-22 DIAGNOSIS — G9009 Other idiopathic peripheral autonomic neuropathy: Secondary | ICD-10-CM | POA: Insufficient documentation

## 2016-10-22 DIAGNOSIS — E782 Mixed hyperlipidemia: Secondary | ICD-10-CM | POA: Diagnosis present

## 2016-10-22 DIAGNOSIS — E1169 Type 2 diabetes mellitus with other specified complication: Secondary | ICD-10-CM | POA: Diagnosis present

## 2016-10-22 DIAGNOSIS — R079 Chest pain, unspecified: Secondary | ICD-10-CM

## 2016-10-22 DIAGNOSIS — R0609 Other forms of dyspnea: Secondary | ICD-10-CM | POA: Diagnosis present

## 2016-10-22 HISTORY — PX: RIGHT/LEFT HEART CATH AND CORONARY ANGIOGRAPHY: CATH118266

## 2016-10-22 LAB — POCT I-STAT 3, ART BLOOD GAS (G3+)
ACID-BASE DEFICIT: 1 mmol/L (ref 0.0–2.0)
BICARBONATE: 25.6 mmol/L (ref 20.0–28.0)
BICARBONATE: 25.5 mmol/L (ref 20.0–28.0)
O2 SAT: 73 %
O2 Saturation: 96 %
PH ART: 7.367 (ref 7.350–7.450)
PO2 ART: 86 mmHg (ref 83.0–108.0)
PO2 ART: 41 mmHg — AB (ref 83.0–108.0)
TCO2: 27 mmol/L (ref 0–100)
TCO2: 27 mmol/L (ref 0–100)
pCO2 arterial: 44.6 mmHg (ref 32.0–48.0)
pCO2 arterial: 47 mmHg (ref 32.0–48.0)
pH, Arterial: 7.344 — ABNORMAL LOW (ref 7.350–7.450)

## 2016-10-22 LAB — POCT I-STAT 3, VENOUS BLOOD GAS (G3P V)
ACID-BASE EXCESS: 1 mmol/L (ref 0.0–2.0)
BICARBONATE: 26.7 mmol/L (ref 20.0–28.0)
O2 SAT: 74 %
TCO2: 28 mmol/L (ref 0–100)
pCO2, Ven: 46.8 mmHg (ref 44.0–60.0)
pH, Ven: 7.364 (ref 7.250–7.430)
pO2, Ven: 41 mmHg (ref 32.0–45.0)

## 2016-10-22 LAB — POCT ACTIVATED CLOTTING TIME: ACTIVATED CLOTTING TIME: 158 s

## 2016-10-22 SURGERY — RIGHT/LEFT HEART CATH AND CORONARY ANGIOGRAPHY
Anesthesia: LOCAL

## 2016-10-22 MED ORDER — ASPIRIN 81 MG PO CHEW
CHEWABLE_TABLET | ORAL | Status: AC
  Administered 2016-10-22: 81 mg via ORAL
  Filled 2016-10-22: qty 1

## 2016-10-22 MED ORDER — ONDANSETRON HCL 4 MG/2ML IJ SOLN: 4.0000 mg | Freq: Four times a day (QID) | INTRAMUSCULAR | Status: DC | PRN

## 2016-10-22 MED ORDER — MIDAZOLAM HCL 2 MG/2ML IJ SOLN
INTRAMUSCULAR | Status: AC
  Filled 2016-10-22: qty 2

## 2016-10-22 MED ORDER — SODIUM CHLORIDE 0.9 % WEIGHT BASED INFUSION: 1.0000 mL/kg/h | INTRAVENOUS | Status: DC

## 2016-10-22 MED ORDER — MIDAZOLAM HCL 2 MG/2ML IJ SOLN
INTRAMUSCULAR | Status: DC | PRN
  Administered 2016-10-22: 2 mg via INTRAVENOUS
  Administered 2016-10-22: 1 mg via INTRAVENOUS

## 2016-10-22 MED ORDER — SODIUM CHLORIDE 0.9% FLUSH: 3.0000 mL | INTRAVENOUS | Status: DC | PRN

## 2016-10-22 MED ORDER — FAMOTIDINE IN NACL 20-0.9 MG/50ML-% IV SOLN
INTRAVENOUS | Status: AC
  Administered 2016-10-22: 20 mg via INTRAVENOUS
  Filled 2016-10-22: qty 50

## 2016-10-22 MED ORDER — HYDRALAZINE HCL 20 MG/ML IJ SOLN
INTRAMUSCULAR | Status: DC | PRN
  Administered 2016-10-22: 10 mg via INTRAVENOUS

## 2016-10-22 MED ORDER — HEPARIN (PORCINE) IN NACL 2-0.9 UNIT/ML-% IJ SOLN
INTRAMUSCULAR | Status: AC
  Filled 2016-10-22: qty 1000

## 2016-10-22 MED ORDER — HEPARIN (PORCINE) IN NACL 2-0.9 UNIT/ML-% IJ SOLN
INTRAMUSCULAR | Status: AC
  Filled 2016-10-22: qty 500

## 2016-10-22 MED ORDER — LABETALOL HCL 5 MG/ML IV SOLN
INTRAVENOUS | Status: AC
  Filled 2016-10-22: qty 4

## 2016-10-22 MED ORDER — SODIUM CHLORIDE 0.9 % IV SOLN: 250.0000 mL | INTRAVENOUS | Status: DC | PRN

## 2016-10-22 MED ORDER — DIPHENHYDRAMINE HCL 50 MG/ML IJ SOLN
25.0000 mg | INTRAMUSCULAR | Status: AC
  Administered 2016-10-22: 25 mg via INTRAVENOUS

## 2016-10-22 MED ORDER — METHYLPREDNISOLONE SODIUM SUCC 125 MG IJ SOLR
125.0000 mg | INTRAMUSCULAR | Status: AC
  Administered 2016-10-22: 125 mg via INTRAVENOUS

## 2016-10-22 MED ORDER — ASPIRIN 81 MG PO CHEW
81.0000 mg | CHEWABLE_TABLET | ORAL | Status: AC
  Administered 2016-10-22: 81 mg via ORAL

## 2016-10-22 MED ORDER — DIPHENHYDRAMINE HCL 50 MG/ML IJ SOLN
INTRAMUSCULAR | Status: AC
  Administered 2016-10-22: 25 mg via INTRAVENOUS
  Filled 2016-10-22: qty 1

## 2016-10-22 MED ORDER — SODIUM CHLORIDE 0.9 % IV SOLN
INTRAVENOUS | Status: DC
Start: 2016-10-22 — End: 2016-10-22

## 2016-10-22 MED ORDER — HYDRALAZINE HCL 20 MG/ML IJ SOLN
INTRAMUSCULAR | Status: AC
  Filled 2016-10-22: qty 1

## 2016-10-22 MED ORDER — HEPARIN SODIUM (PORCINE) 1000 UNIT/ML IJ SOLN
INTRAMUSCULAR | Status: DC | PRN
  Administered 2016-10-22: 5000 [IU] via INTRAVENOUS

## 2016-10-22 MED ORDER — VERAPAMIL HCL 2.5 MG/ML IV SOLN
INTRAVENOUS | Status: DC | PRN
  Administered 2016-10-22: 10 mL via INTRA_ARTERIAL

## 2016-10-22 MED ORDER — HEPARIN (PORCINE) IN NACL 2-0.9 UNIT/ML-% IJ SOLN
INTRAMUSCULAR | Status: AC | PRN
  Administered 2016-10-22: 1500 mL

## 2016-10-22 MED ORDER — HEPARIN SODIUM (PORCINE) 1000 UNIT/ML IJ SOLN
INTRAMUSCULAR | Status: AC
  Filled 2016-10-22: qty 1

## 2016-10-22 MED ORDER — FENTANYL CITRATE (PF) 100 MCG/2ML IJ SOLN
INTRAMUSCULAR | Status: AC
  Filled 2016-10-22: qty 2

## 2016-10-22 MED ORDER — LABETALOL HCL 5 MG/ML IV SOLN
INTRAVENOUS | Status: DC | PRN
  Administered 2016-10-22 (×2): 10 mg via INTRAVENOUS

## 2016-10-22 MED ORDER — VERAPAMIL HCL 2.5 MG/ML IV SOLN
INTRAVENOUS | Status: AC
  Filled 2016-10-22: qty 2

## 2016-10-22 MED ORDER — FENTANYL CITRATE (PF) 100 MCG/2ML IJ SOLN
INTRAMUSCULAR | Status: DC | PRN
  Administered 2016-10-22: 25 ug via INTRAVENOUS
  Administered 2016-10-22: 50 ug via INTRAVENOUS

## 2016-10-22 MED ORDER — IOPAMIDOL (ISOVUE-370) INJECTION 76%
INTRAVENOUS | Status: DC | PRN
  Administered 2016-10-22: 80 mL via INTRA_ARTERIAL

## 2016-10-22 MED ORDER — SODIUM CHLORIDE 0.9 % WEIGHT BASED INFUSION
3.0000 mL/kg/h | INTRAVENOUS | Status: AC
  Administered 2016-10-22: 3 mL/kg/h via INTRAVENOUS

## 2016-10-22 MED ORDER — SODIUM CHLORIDE 0.9% FLUSH: 3.0000 mL | Freq: Two times a day (BID) | INTRAVENOUS | Status: DC

## 2016-10-22 MED ORDER — LIDOCAINE HCL (PF) 1 % IJ SOLN
INTRAMUSCULAR | Status: DC | PRN
  Administered 2016-10-22: 15 mL via INTRADERMAL
  Administered 2016-10-22: 2 mL via INTRADERMAL

## 2016-10-22 MED ORDER — LIDOCAINE HCL (PF) 1 % IJ SOLN
INTRAMUSCULAR | Status: AC
  Filled 2016-10-22: qty 30

## 2016-10-22 MED ORDER — METHYLPREDNISOLONE SODIUM SUCC 125 MG IJ SOLR
INTRAMUSCULAR | Status: AC
  Administered 2016-10-22: 125 mg via INTRAVENOUS
  Filled 2016-10-22: qty 2

## 2016-10-22 MED ORDER — FAMOTIDINE IN NACL 20-0.9 MG/50ML-% IV SOLN
20.0000 mg | INTRAVENOUS | Status: AC
  Administered 2016-10-22: 20 mg via INTRAVENOUS

## 2016-10-22 MED ORDER — ACETAMINOPHEN 325 MG PO TABS: 650.0000 mg | ORAL_TABLET | ORAL | Status: DC | PRN

## 2016-10-22 SURGICAL SUPPLY — 15 items
CATH INFINITI 5 FR JL3.5 (CATHETERS) ×1 IMPLANT
CATH OPTITORQUE TIG 4.0 5F (CATHETERS) ×1 IMPLANT
CATH SWAN GANZ 7F STRAIGHT (CATHETERS) ×1 IMPLANT
COVER PRB 48X5XTLSCP FOLD TPE (BAG) IMPLANT
COVER PROBE 5X48 (BAG) ×2
DEVICE RAD COMP TR BAND LRG (VASCULAR PRODUCTS) ×1 IMPLANT
GLIDESHEATH SLEND A-KIT 6F 22G (SHEATH) ×1 IMPLANT
GUIDEWIRE INQWIRE 1.5J.035X260 (WIRE) IMPLANT
INQWIRE 1.5J .035X260CM (WIRE) ×2
KIT HEART LEFT (KITS) ×2 IMPLANT
PACK CARDIAC CATHETERIZATION (CUSTOM PROCEDURE TRAY) ×2 IMPLANT
SHEATH GLIDE SLENDER 4/5FR (SHEATH) IMPLANT
SHEATH PINNACLE 7F 10CM (SHEATH) ×1 IMPLANT
TRANSDUCER W/STOPCOCK (MISCELLANEOUS) ×4 IMPLANT
TUBING CIL FLEX 10 FLL-RA (TUBING) ×2 IMPLANT
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
## 2016-10-22 NOTE — Progress Notes (Signed)
Site area: Right groin a 7 french venous sheath was removed  Site Prior to Removal:  Level 0  Pressure Applied For 15 MINUTES    Bedrest Beginning at 1320p  Manual:   Yes.    Patient Status During Pull:  stable  Post Pull Groin Site:  Level 0  Post Pull Instructions Given:  Yes.    Post Pull Pulses Present:  Yes.    Dressing Applied:  Yes.    Comments:  VS remain stable during sheath pull

## 2016-10-22 NOTE — Discharge Instructions (Signed)
Radial Site Care Refer to this sheet in the next few weeks. These instructions provide you with information about caring for yourself after your procedure. Your health care provider may also give you more specific instructions. Your treatment has been planned according to current medical practices, but problems sometimes occur. Call your health care provider if you have any problems or questions after your procedure. What can I expect after the procedure? After your procedure, it is typical to have the following:  Bruising at the radial site that usually fades within 1-2 weeks.  Blood collecting in the tissue (hematoma) that may be painful to the touch. It should usually decrease in size and tenderness within 1-2 weeks. Follow these instructions at home:  Take medicines only as directed by your health care provider.  You may shower 24-48 hours after the procedure or as directed by your health care provider. Remove the bandage (dressing) and gently wash the site with plain soap and water. Pat the area dry with a clean towel. Do not rub the site, because this may cause bleeding.  Do not take baths, swim, or use a hot tub until your health care provider approves.  Check your insertion site every day for redness, swelling, or drainage.  Do not apply powder or lotion to the site.  Do not flex or bend the affected arm for 24 hours or as directed by your health care provider.  Do not push or pull heavy objects with the affected arm for 24 hours or as directed by your health care provider.  Do not lift over 10 lb (4.5 kg) for 5 days after your procedure or as directed by your health care provider.  Ask your health care provider when it is okay to:  Return to work or school.  Resume usual physical activities or sports.  Resume sexual activity.  Do not drive home if you are discharged the same day as the procedure. Have someone else drive you.  You may drive 24 hours after the procedure  unless otherwise instructed by your health care provider.  Do not operate machinery or power tools for 24 hours after the procedure.  If your procedure was done as an outpatient procedure, which means that you went home the same day as your procedure, a responsible adult should be with you for the first 24 hours after you arrive home.  Keep all follow-up visits as directed by your health care provider. This is important. Contact a health care provider if:  You have a fever.  You have chills. You have increased bleeding from the radial site. Hold pressure on the site. CALL 911 Angiogram, Care After This sheet gives you information about how to care for yourself after your procedure. Your health care provider may also give you more specific instructions. If you have problems or questions, contact your health care provider. What can I expect after the procedure? After the procedure, it is common to have bruising and tenderness at the catheter insertion area. Follow these instructions at home: Insertion site care  Follow instructions from your health care provider about how to take care of your insertion site. Make sure you: Wash your hands with soap and water before you change your bandage (dressing). If soap and water are not available, use hand sanitizer. Change your dressing as told by your health care provider. Leave stitches (sutures), skin glue, or adhesive strips in place. These skin closures may need to stay in place for 2 weeks or longer. If  adhesive strip edges start to loosen and curl up, you may trim the loose edges. Do not remove adhesive strips completely unless your health care provider tells you to do that. Do not take baths, swim, or use a hot tub until your health care provider approves. You may shower 24-48 hours after the procedure or as told by your health care provider. Gently wash the site with plain soap and water. Pat the area dry with a clean towel. Do not rub the  site. This may cause bleeding. Do not apply powder or lotion to the site. Keep the site clean and dry. Check your insertion site every day for signs of infection. Check for: Redness, swelling, or pain. Fluid or blood. Warmth. Pus or a bad smell. Activity  Rest as told by your health care provider, usually for 1-2 days. Do not lift anything that is heavier than 10 lbs. (4.5 kg) or as told by your health care provider. Do not drive for 24 hours if you were given a medicine to help you relax (sedative). Do not drive or use heavy machinery while taking prescription pain medicine. General instructions  Return to your normal activities as told by your health care provider, usually in about a week. Ask your health care provider what activities are safe for you. If the catheter site starts bleeding, lie flat and put pressure on the site. If the bleeding does not stop, get help right away. This is a medical emergency. Drink enough fluid to keep your urine clear or pale yellow. This helps flush the contrast dye from your body. Take over-the-counter and prescription medicines only as told by your health care provider. Keep all follow-up visits as told by your health care provider. This is important. Contact a health care provider if: You have a fever or chills. You have redness, swelling, or pain around your insertion site. You have fluid or blood coming from your insertion site. The insertion site feels warm to the touch. You have pus or a bad smell coming from your insertion site. You have bruising around the insertion site. You notice blood collecting in the tissue around the catheter site (hematoma). The hematoma may be painful to the touch. Get help right away if: You have severe pain at the catheter insertion area. The catheter insertion area swells very fast. The catheter insertion area is bleeding, and the bleeding does not stop when you hold steady pressure on the area. The area near or  just beyond the catheter insertion site becomes pale, cool, tingly, or numb. These symptoms may represent a serious problem that is an emergency. Do not wait to see if the symptoms will go away. Get medical help right away. Call your local emergency services (911 in the [place] Do not drive yourself to the hospital. Summary After the procedure, it is common to have bruising and tenderness at the catheter insertion area. After the procedure, it is important to rest and drink plenty of fluids. Do not take baths, swim, or use a hot tub until your health care provider says it is okay to do so. You may shower 24-48 hours after the procedure or as told by your health care provider. If the catheter site starts bleeding, lie flat and put pressure on the site. If the bleeding does not stop, get help right away. This is a medical emergency. This information is not intended to replace advice given to you by your health care provider. Make sure you discuss any questions you  have with your health care provider. Document Released: 12/04/2004 Document Revised: 04/22/2016 Document Reviewed: 04/22/2016 Elsevier Interactive Patient Education  2017 [place] help right away if:  You have unusual pain at the radial site.  You have redness, warmth, or swelling at the radial site.  You have drainage (other than a small amount of blood on the dressing) from the radial site.  The radial site is bleeding, and the bleeding does not stop after 30 minutes of holding steady pressure on the site.  Your arm or hand becomes pale, cool, tingly, or numb. This information is not intended to replace advice given to you by your health care provider. Make sure you discuss any questions you have with your health care provider. Document Released: 06/20/2010 Document Revised: 10/24/2015 Document Reviewed: 12/04/2013 Elsevier Interactive Patient Education  2017 [employer].

## 2016-10-22 NOTE — Progress Notes (Signed)
Many attempts for IV access for r/lhc, Bryan rt cath lab gave permission for 1 iv.

## 2016-10-22 NOTE — Interval H&P Note (Signed)
History and Physical Interval Note:  10/22/2016 11:39 AM  [name]  has presented today for surgery, with the diagnosis of cp with moderate risk - continued pain despite normal nuclear ST & progressive DOE (concern for Pulm HTN) The various methods of treatment have been discussed with the patient and family. After consideration of risks, benefits and other options for treatment, the patient has consented to  Procedure(s): Right/Left Heart Cath and Coronary Angiography (N/A) with possible Percutaneous Coronary Intervention as a surgical intervention .  The patient's history has been reviewed, patient examined, no change in status, stable for surgery.  I have reviewed the patient's chart and labs.  Questions were answered to the patient's satisfaction.    Cath Lab Visit (complete for each Cath Lab visit)  Clinical Evaluation Leading to the Procedure:   ACS: No.  Non-ACS:    Anginal Classification: CCS III  Anti-ischemic medical therapy: Maximal Therapy (2 or more classes of medications)  Non-Invasive Test Results: No non-invasive testing performed  Prior CABG: No previous CABG  [name]

## 2016-10-27 ENCOUNTER — Ambulatory Visit: Payer: Medicare Other

## 2016-11-09 ENCOUNTER — Ambulatory Visit: Payer: Medicare Other | Admitting: Internal Medicine

## 2016-11-10 ENCOUNTER — Telehealth: Payer: Self-pay | Admitting: Internal Medicine

## 2016-11-10 NOTE — Telephone Encounter (Signed)
Error.[name] ° °

## 2016-11-16 ENCOUNTER — Ambulatory Visit: Payer: Medicare Other | Admitting: Internal Medicine

## 2016-11-23 ENCOUNTER — Ambulatory Visit: Payer: Medicare Other | Admitting: Cardiology

## 2016-11-24 ENCOUNTER — Telehealth: Payer: Self-pay | Admitting: *Deleted

## 2016-11-24 NOTE — Telephone Encounter (Signed)
Spoke to patient  Informed patient cardiac clearance for upcomig surgery 01/25/17 with Dr Wynelle Link. Patient does not need appt 11/30/16.  per Dr Ellyn Hack,  Patient aware and recall schedule for 8 months from now.  Faxed Clearance to Dr Aluisio's office.

## 2016-11-30 ENCOUNTER — Ambulatory Visit: Payer: Medicare Other | Admitting: Cardiology

## 2016-12-07 ENCOUNTER — Telehealth: Payer: Self-pay | Admitting: Internal Medicine

## 2016-12-07 NOTE — Telephone Encounter (Signed)
[name]- could we split and give each 15 minutes?

## 2016-12-07 NOTE — Telephone Encounter (Signed)
CY  Please Advise-(Appoitnment)  Pt called in and stated that he had to cancel his appt from 6/18 due to having to take his wife to the ER due to a small TIA. Pt states his wife has an appt with you on 7/12 at 3pm and he would prefer to see you on that same day as well. He states he would also like to discuss his his upcoming surgery done his left knee.

## 2016-12-07 NOTE — Telephone Encounter (Signed)
Please call patient and let him know we have added him to CY's schedule Thursday 12/10/16 at 2:30pm-right before his wife's appt. Thanks.

## 2016-12-07 NOTE — Telephone Encounter (Signed)
Spoke with pt and he agreed to the appt date and time. He had no further questions at this time. Nothing further is needed

## 2016-12-10 ENCOUNTER — Ambulatory Visit: Payer: Medicare Other | Admitting: Internal Medicine

## 2016-12-10 ENCOUNTER — Telehealth: Payer: Self-pay | Admitting: Internal Medicine

## 2016-12-10 NOTE — Telephone Encounter (Signed)
Please have patient come in on Friday 01-08-17 at 11:30am slot. Please let him know this is a work in slot and might be a slightly longer wait time to be seen. Thanks.

## 2016-12-10 NOTE — Telephone Encounter (Signed)
lmtcb x1 for pt. 

## 2016-12-10 NOTE — Telephone Encounter (Signed)
Called and spoke with pt and he stated that he was told to call and speak with Joellen Jersey about an appt with CY----he stated that he was having some issues with his stomach.  Katie the pt wanted to schedule an appt with CY before the end of august due to surgery.  Does he need to have the appt with CY before surgery since the cardiologist has already cleared him.  Thanks

## 2016-12-11 NOTE — Telephone Encounter (Signed)
Appointment has already been scheduled by [facility]. Message will be closed.

## 2017-01-05 ENCOUNTER — Ambulatory Visit: Payer: Self-pay | Admitting: Orthopedic Surgery

## 2017-01-08 ENCOUNTER — Encounter: Payer: Self-pay | Admitting: Internal Medicine

## 2017-01-08 ENCOUNTER — Ambulatory Visit (INDEPENDENT_AMBULATORY_CARE_PROVIDER_SITE_OTHER): Payer: Medicare Other | Admitting: Internal Medicine

## 2017-01-08 DIAGNOSIS — I350 Nonrheumatic aortic (valve) stenosis: Secondary | ICD-10-CM

## 2017-01-08 DIAGNOSIS — J449 Chronic obstructive pulmonary disease, unspecified: Secondary | ICD-10-CM | POA: Diagnosis not present

## 2017-01-08 NOTE — Progress Notes (Signed)
HPI M Veteran, Never smoker followed for Bronchiectasis/recurrent pneumonia/Enterobacter due to reflux, complicated by DM, HBP, lung nodule, GERD, ischemic colitis/ multiple surgeries, valvular heart disease Office Spirometry 01/16/16-moderate restriction of exhaled volume, at least mild obstructive airways disease. FVC 3.02/59%, FEV1 2.13/56%, ratio 0.95, FEF 25-75% 1.28/42%  ---------------------------------------------------------  07/08/2816- 66 year old male [name], never smoker, followed for Bronchiectasis/recurrent pneumonia/Enterobacter/recurrent aspiration, complicated by DM, HBP, lung nodule, GERD, ischemic colitis/multiple surgeries, valvular heart disease, ASCVD 4 mo for COPD Ran out of Spiriva and doesn't miss it. Symbicort helps better. Still productive cough with clear to milky sputum. No fever or sweat. Using "essential oils" in room humidifier. Taking nitroglycerin once or twice daily for left anterior chest pains, not clearly exertional. Cardiology follow-up pending  Coverage was denied for pneumatic vest to help secretion clearance.  01/08/17- 66 year old male [name], never smoker, followed for Bronchiectasis/recurrent pneumonia/Enterobacter/recurrent aspiration, complicated by DM, HBP, lung nodule, GERD, ischemic colitis/multiple surgeries, valvular heart disease, ASCVD SOB has become worse. Pt been having pain due to having dental work done and stated that he had to take 2 nitroglycerin last night. Pt still coughing with occ. Mucus. Near baseline status. Some dyspnea with exertion but activity is limited by knee pain, now pending left TKR August 27. Variable cough. Usually nonproductive or clear sputum. Pending completion dental extractions but waiting until after his knee surgery. No acute respiratory events recently. Denies fever or purulent sputum. Echocardiogram had shown EF 60-65 percent, mild AS, AR, grade 1 diastolic dysfunction CXR 06/06/08 No active cardiopulmonary  disease. Office Spirometry 01/16/16-moderate restriction of exhaled volume, at least mild obstructive airways disease. FVC 3.02/59%, FEV1 2.13/56%, ratio 0.95, FEF 25-75% 1.28/42%  ROS-   += pos Constitutional:   No-   weight loss, night sweats, +fevers, chills, fatigue, lassitude. HEENT:   No-  headaches, difficulty swallowing, +tooth/dental problems, sore throat,        sneezing, itching, ear ache, nasal congestion, post nasal drip,  CV:      chest pain, +orthopnea, PND, swelling in lower extremities, anasarca, dizziness, +palpitations Resp: + shortness of breath with exertion or at rest.                productive cough,  No non-productive cough,              change in color of mucus.  No- wheezing.   Skin: No-   rash or lesions. GI:  No-   heartburn, indigestion, +abdominal pain, nausea, vomiting,  GU:  MS:  +  joint pain or swelling.  Neuro-     nothing unusual Psych:  No- change in mood or affect. + depression or anxiety.  No memory loss  OBJ- Physical Exam   + color is better and he clearly seems to feel better this visit General- Alert, Oriented, + anxious/depressed, Distress- none acute, + overweight Skin- + fungal nail changes right hand and also reported on toes of both feet Lymphadenopathy- none Head- atraumatic            Eyes- Gross vision intact, PERRLA, conjunctivae and secretions clear            Ears- Hearing, canals-normal            Nose-  no-Septal dev,polyps, erosion, perforation             Throat- Mallampati II , mucosa clear , drainage- none, tonsils- atrophic. + Missing teeth,  Neck- flexible , trachea midline, no stridor , thyroid nl, carotid no bruit Chest - symmetrical excursion ,  unlabored           Heart/CV- RRR ,  murmur + 2/6 S aortic , no gallop  , no rub, nl s1 s2                           - JVD- none , edema- none, stasis changes- none, varices- none           Lung-  breath sounds clear for him with no significant rhonchi or wheeze, unlabored,  cough-none, wheeze -none                  dullness-none, rub- none           Chest wall-  Abd-  Br/ Gen/ Rectal- Not done, not indicated Extrem-  +cane,  Neuro- grossly intact to observation

## 2017-01-08 NOTE — Patient Instructions (Signed)
I think your pulmonary status is stable for planned surgery  Please call if we can help

## 2017-01-08 NOTE — Assessment & Plan Note (Signed)
Followed by cardiology with most recent echocardiogram showing mild AF, AR, grade 1 diastolic dysfunction, EF 29-03 percent

## 2017-01-08 NOTE — Assessment & Plan Note (Signed)
Currently clear and likely at baseline with no recent exacerbation. He has had repeated episodes of bronchitis and has cultured Enterobacter thought to reflect poor dentition and reflux from intermittent bowel obstruction (history of multiple abdominal surgeries). Needs to maintain aspiration precautions as discussed.

## 2017-01-14 HISTORY — PX: DENTAL SURGERY: SHX609

## 2017-01-19 NOTE — Progress Notes (Signed)
10-22-16 (EPIC) EKG,     CATH (mild-moderate aortic valve stenosis   03-24-16 (EPIC) ECHO 60-65% mild stenosis, mild regurgitation  01-31-16 (EPIC) CT chest w/o contrast

## 2017-01-19 NOTE — Patient Instructions (Signed)
[name]  01/19/2017   Your procedure is scheduled on: 01-25-17   Report to [facility]  Entrance Report to Admitting at 9:05 AM   Call this number if you have problems the morning of surgery [phone]   Remember: ONLY 1 PERSON MAY GO WITH YOU TO SHORT STAY TO GET  READY MORNING OF YOUR SURGERY.  Do not eat food or drink liquids :After Midnight.     Take these medicines the morning of surgery with A SIP OF WATER: Carvedilol (Coreg). You may also bring and use your nasal spray and inhaler as needed.                                You may not have any metal on your body including hair pins and              piercings  Do not wear jewelry, make-up, lotions, powders or perfumes, deodorant             Men may shave face and neck.   Do not bring valuables to the hospital. [place].  Contacts, dentures or bridgework may not be worn into surgery.  Leave suitcase in the car. After surgery it may be brought to your room.                 Please read over the following fact sheets you were given: _____________________________________________________________________             [facility] - Preparing for Surgery Before surgery, you can play an important role.  Because skin is not sterile, your skin needs to be as free of germs as possible.  You can reduce the number of germs on your skin by washing with CHG (chlorahexidine gluconate) soap before surgery.  CHG is an antiseptic cleaner which kills germs and bonds with the skin to continue killing germs even after washing. Please DO NOT use if you have an allergy to CHG or antibacterial soaps.  If your skin becomes reddened/irritated stop using the CHG and inform your nurse when you arrive at Short Stay. Do not shave (including legs and underarms) for at least 48 hours prior to the first CHG shower.  You may shave your face/neck. Please follow these  instructions carefully:  1.  Shower with CHG Soap the night before surgery and the  morning of Surgery.  2.  If you choose to wash your hair, wash your hair first as usual with your  normal  shampoo.  3.  After you shampoo, rinse your hair and body thoroughly to remove the  shampoo.                           4.  Use CHG as you would any other liquid soap.  You can apply chg directly  to the skin and wash                       Gently with a scrungie or clean washcloth.  5.  Apply the CHG Soap to your body ONLY FROM THE NECK DOWN.   Do not use on face/ open  Wound or open sores. Avoid contact with eyes, ears mouth and genitals (private parts).                       Wash face,  Genitals (private parts) with your normal soap.             6.  Wash thoroughly, paying special attention to the area where your surgery  will be performed.  7.  Thoroughly rinse your body with warm water from the neck down.  8.  DO NOT shower/wash with your normal soap after using and rinsing off  the CHG Soap.                9.  Pat yourself dry with a clean towel.            10.  Wear clean pajamas.            11.  Place clean sheets on your bed the night of your first shower and do not  sleep with pets. Day of Surgery : Do not apply any lotions/deodorants the morning of surgery.  Please wear clean clothes to the hospital/surgery center.  FAILURE TO FOLLOW THESE INSTRUCTIONS MAY RESULT IN THE CANCELLATION OF YOUR SURGERY PATIENT SIGNATURE_________________________________  NURSE SIGNATURE__________________________________  ________________________________________________________________________   [name]  An incentive spirometer is a tool that can help keep your lungs clear and active. This tool measures how well you are filling your lungs with each breath. Taking long deep breaths may help reverse or decrease the chance of developing breathing (pulmonary) problems (especially  infection) following:  A long period of time when you are unable to move or be active. BEFORE THE PROCEDURE   If the spirometer includes an indicator to show your best effort, your nurse or respiratory therapist will set it to a desired goal.  If possible, sit up straight or lean slightly forward. Try not to slouch.  Hold the incentive spirometer in an upright position. INSTRUCTIONS FOR USE  1. Sit on the edge of your bed if possible, or sit up as far as you can in bed or on a chair. 2. Hold the incentive spirometer in an upright position. 3. Breathe out normally. 4. Place the mouthpiece in your mouth and seal your lips tightly around it. 5. Breathe in slowly and as deeply as possible, raising the piston or the ball toward the top of the column. 6. Hold your breath for 3-5 seconds or for as long as possible. Allow the piston or ball to fall to the bottom of the column. 7. Remove the mouthpiece from your mouth and breathe out normally. 8. Rest for a few seconds and repeat Steps 1 through 7 at least 10 times every 1-2 hours when you are awake. Take your time and take a few normal breaths between deep breaths. 9. The spirometer may include an indicator to show your best effort. Use the indicator as a goal to work toward during each repetition. 10. After each set of 10 deep breaths, practice coughing to be sure your lungs are clear. If you have an incision (the cut made at the time of surgery), support your incision when coughing by placing a pillow or rolled up towels firmly against it. Once you are able to get out of bed, walk around indoors and cough well. You may stop using the incentive spirometer when instructed by your caregiver.  RISKS AND COMPLICATIONS  Take your time so you do not get  dizzy or light-headed.  If you are in pain, you may need to take or ask for pain medication before doing incentive spirometry. It is harder to take a deep breath if you are having pain. AFTER  USE  Rest and breathe slowly and easily.  It can be helpful to keep track of a log of your progress. Your caregiver can provide you with a simple table to help with this. If you are using the spirometer at home, follow these instructions: [place] IF:   You are having difficultly using the spirometer.  You have trouble using the spirometer as often as instructed.  Your pain medication is not giving enough relief while using the spirometer.  You develop fever of 100.5 F (38.1 C) or higher. SEEK IMMEDIATE MEDICAL CARE IF:   You cough up bloody sputum that had not been present before.  You develop fever of 102 F (38.9 C) or greater.  You develop worsening pain at or near the incision site. MAKE SURE YOU:   Understand these instructions.  Will watch your condition.  Will get help right away if you are not doing well or get worse. Document Released: 09/28/2006 Document Revised: 08/10/2011 Document Reviewed: 11/29/2006 [employer] Patient Information 2014 [employer], [place].   ________________________________________________________________________  WHAT IS A BLOOD TRANSFUSION? Blood Transfusion Information  A transfusion is the replacement of blood or some of its parts. Blood is made up of multiple cells which provide different functions.  Red blood cells carry oxygen and are used for blood loss replacement.  White blood cells fight against infection.  Platelets control bleeding.  Plasma helps clot blood.  Other blood products are available for specialized needs, such as hemophilia or other clotting disorders. BEFORE THE TRANSFUSION  Who gives blood for transfusions?   Healthy volunteers who are fully evaluated to make sure their blood is safe. This is blood bank blood. Transfusion therapy is the safest it has ever been in the practice of medicine. Before blood is taken from a donor, a complete history is taken to make sure that person has no history of diseases  nor engages in risky social behavior (examples are intravenous drug use or sexual activity with multiple partners). The donor's travel history is screened to minimize risk of transmitting infections, such as malaria. The donated blood is tested for signs of infectious diseases, such as HIV and hepatitis. The blood is then tested to be sure it is compatible with you in order to minimize the chance of a transfusion reaction. If you or a relative donates blood, this is often done in anticipation of surgery and is not appropriate for emergency situations. It takes many days to process the donated blood. RISKS AND COMPLICATIONS Although transfusion therapy is very safe and saves many lives, the main dangers of transfusion include:   Getting an infectious disease.  Developing a transfusion reaction. This is an allergic reaction to something in the blood you were given. Every precaution is taken to prevent this. The decision to have a blood transfusion has been considered carefully by your caregiver before blood is given. Blood is not given unless the benefits outweigh the risks. AFTER THE TRANSFUSION  Right after receiving a blood transfusion, you will usually feel much better and more energetic. This is especially true if your red blood cells have gotten low (anemic). The transfusion raises the level of the red blood cells which carry oxygen, and this usually causes an energy increase.  The nurse administering the transfusion will  monitor you carefully for complications. HOME CARE INSTRUCTIONS  No special instructions are needed after a transfusion. You may find your energy is better. Speak with your caregiver about any limitations on activity for underlying diseases you may have. SEEK MEDICAL CARE IF:   Your condition is not improving after your transfusion.  You develop redness or irritation at the intravenous (IV) site. SEEK IMMEDIATE MEDICAL CARE IF:  Any of the following symptoms occur over the  next 12 hours:  Shaking chills.  You have a temperature by mouth above 102 F (38.9 C), not controlled by medicine.  Chest, back, or muscle pain.  People around you feel you are not acting correctly or are confused.  Shortness of breath or difficulty breathing.  Dizziness and fainting.  You get a rash or develop hives.  You have a decrease in urine output.  Your urine turns a dark color or changes to pink, red, or brown. Any of the following symptoms occur over the next 10 days:  You have a temperature by mouth above 102 F (38.9 C), not controlled by medicine.  Shortness of breath.  Weakness after normal activity.  The white part of the eye turns yellow (jaundice).  You have a decrease in the amount of urine or are urinating less often.  Your urine turns a dark color or changes to pink, red, or brown. Document Released: 05/15/2000 Document Revised: 08/10/2011 Document Reviewed: 01/02/2008 [facility] Patient Information 2014 [place], [place].  _______________________________________________________________________

## 2017-01-20 ENCOUNTER — Inpatient Hospital Stay (HOSPITAL_COMMUNITY)
Admission: RE | Admit: 2017-01-20 | Discharge: 2017-01-20 | Disposition: A | Discharge status: Home / Self Care | Payer: Medicare Other | Source: Ambulatory Visit

## 2017-01-21 ENCOUNTER — Ambulatory Visit: Payer: Self-pay | Admitting: Orthopedic Surgery
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
## 2017-01-22 ENCOUNTER — Encounter (HOSPITAL_COMMUNITY): Payer: Self-pay

## 2017-01-22 ENCOUNTER — Encounter (HOSPITAL_COMMUNITY)
Admission: RE | Admit: 2017-01-22 | Discharge: 2017-01-22 | Disposition: A | Discharge status: Home / Self Care | Payer: Medicare Other | Source: Ambulatory Visit | Attending: Orthopedic Surgery | Admitting: Orthopedic Surgery

## 2017-01-22 LAB — HEMOGLOBIN A1C
Hgb A1c MFr Bld: 5.1 % (ref 4.8–5.6)
Mean Plasma Glucose: 99.67 mg/dL

## 2017-01-22 LAB — COMPREHENSIVE METABOLIC PANEL
ALBUMIN: 4.2 g/dL (ref 3.5–5.0)
ALK PHOS: 95 U/L (ref 38–126)
ALT: 16 U/L — AB (ref 17–63)
AST: 20 U/L (ref 15–41)
Anion gap: 8 (ref 5–15)
BUN: 17 mg/dL (ref 6–20)
CALCIUM: 9.5 mg/dL (ref 8.9–10.3)
CO2: 26 mmol/L (ref 22–32)
Chloride: 103 mmol/L (ref 101–111)
Creatinine, Ser: 0.74 mg/dL (ref 0.61–1.24)
GFR calc Af Amer: >60 mL/min (ref >60)
GFR calc non Af Amer: >60 mL/min (ref >60)
GLUCOSE: 119 mg/dL — AB (ref 65–99)
Potassium: 4 mmol/L (ref 3.5–5.1)
Sodium: 137 mmol/L (ref 135–145)
Total Bilirubin: 1.2 mg/dL (ref 0.3–1.2)
Total Protein: 7.5 g/dL (ref 6.5–8.1)

## 2017-01-22 LAB — CBC
HEMATOCRIT: 39.3 % (ref 39.0–52.0)
HEMOGLOBIN: 13.3 g/dL (ref 13.0–17.0)
MCH: 29.5 pg (ref 26.0–34.0)
MCHC: 33.8 g/dL (ref 30.0–36.0)
MCV: 87.1 fL (ref 78.0–100.0)
Platelets: 272 10*3/uL (ref 150–400)
RBC: 4.51 MIL/uL (ref 4.22–5.81)
RDW: 13.3 % (ref 11.5–15.5)
WBC: 5.5 10*3/uL (ref 4.0–10.5)

## 2017-01-22 LAB — SURGICAL PCR SCREEN
MRSA, PCR: NEGATIVE
Staphylococcus aureus: POSITIVE — AB

## 2017-01-22 LAB — PROTIME-INR
INR: 1.03
Prothrombin Time: 13.5 seconds (ref 11.4–15.2)

## 2017-01-22 LAB — APTT: APTT: 28 s (ref 24–36)

## 2017-01-22 NOTE — Progress Notes (Signed)
Anesthesia Dr Sharlene Motts consulted d/t pt hx of mild-moderate aortic stenosis. Patient has received clearance from Dr Ellyn Hack his cardiologist specifically for this surgery .  Per Dr Jillyn Hidden patient ok to proceed with surgery .

## 2017-01-22 NOTE — Progress Notes (Signed)
LOV/Pulmonology clearance Dr Annamaria Boots in epic 01-08-17   Cardiology clearance Dr Ellyn Hack on chart / in epic Media tab

## 2017-01-22 NOTE — Patient Instructions (Signed)
[name]  01/22/2017   Your procedure is scheduled on: 01-25-17  Report to [facility] Main  Entrance   Report to admitting at [facility]   Call this number if you have problems the morning of surgery  [phone]   Remember: ONLY 1 PERSON MAY GO WITH YOU TO SHORT STAY TO GET  READY MORNING OF YOUR SURGERY.  Do not eat food or drink liquids :After Midnight.     Take these medicines the morning of surgery with A SIP OF WATER: carvedilol(coreg), hydralazine(apresoline) as needed, inhaler as needed, nasal spray as needed,                                  You may not have any metal on your body including hair pins and              piercings  Do not wear jewelry, make-up, lotions, powders or perfumes, deodorant                         Men may shave face and neck.   Do not bring valuables to the hospital. [place].  Contacts, dentures or bridgework may not be worn into surgery.  Leave suitcase in the car. After surgery it may be brought to your room.               Please read over the following fact sheets you were given: _____________________________________________________________________   [facility] - Preparing for Surgery Before surgery, you can play an important role.  Because skin is not sterile, your skin needs to be as free of germs as possible.  You can reduce the number of germs on your skin by washing with CHG (chlorahexidine gluconate) soap before surgery.  CHG is an antiseptic cleaner which kills germs and bonds with the skin to continue killing germs even after washing. Please DO NOT use if you have an allergy to CHG or antibacterial soaps.  If your skin becomes reddened/irritated stop using the CHG and inform your nurse when you arrive at Short Stay. Do not shave (including legs and underarms) for at least 48 hours prior to the first CHG shower.  You may shave your face/neck. Please follow  these instructions carefully:  1.  Shower with CHG Soap the night before surgery and the  morning of Surgery.  2.  If you choose to wash your hair, wash your hair first as usual with your  normal  shampoo.  3.  After you shampoo, rinse your hair and body thoroughly to remove the  shampoo.                           4.  Use CHG as you would any other liquid soap.  You can apply chg directly  to the skin and wash                       Gently with a scrungie or clean washcloth.  5.  Apply the CHG Soap to your body ONLY FROM THE NECK DOWN.   Do not use on face/ open  Wound or open sores. Avoid contact with eyes, ears mouth and genitals (private parts).                       Wash face,  Genitals (private parts) with your normal soap.             6.  Wash thoroughly, paying special attention to the area where your surgery  will be performed.  7.  Thoroughly rinse your body with warm water from the neck down.  8.  DO NOT shower/wash with your normal soap after using and rinsing off  the CHG Soap.                9.  Pat yourself dry with a clean towel.            10.  Wear clean pajamas.            11.  Place clean sheets on your bed the night of your first shower and do not  sleep with pets. Day of Surgery : Do not apply any lotions/deodorants the morning of surgery.  Please wear clean clothes to the hospital/surgery center.  FAILURE TO FOLLOW THESE INSTRUCTIONS MAY RESULT IN THE CANCELLATION OF YOUR SURGERY PATIENT SIGNATURE_________________________________  NURSE SIGNATURE__________________________________  ________________________________________________________________________   [name]  An incentive spirometer is a tool that can help keep your lungs clear and active. This tool measures how well you are filling your lungs with each breath. Taking long deep breaths may help reverse or decrease the chance of developing breathing (pulmonary) problems  (especially infection) following:  A long period of time when you are unable to move or be active. BEFORE THE PROCEDURE   If the spirometer includes an indicator to show your best effort, your nurse or respiratory therapist will set it to a desired goal.  If possible, sit up straight or lean slightly forward. Try not to slouch.  Hold the incentive spirometer in an upright position. INSTRUCTIONS FOR USE  1. Sit on the edge of your bed if possible, or sit up as far as you can in bed or on a chair. 2. Hold the incentive spirometer in an upright position. 3. Breathe out normally. 4. Place the mouthpiece in your mouth and seal your lips tightly around it. 5. Breathe in slowly and as deeply as possible, raising the piston or the ball toward the top of the column. 6. Hold your breath for 3-5 seconds or for as long as possible. Allow the piston or ball to fall to the bottom of the column. 7. Remove the mouthpiece from your mouth and breathe out normally. 8. Rest for a few seconds and repeat Steps 1 through 7 at least 10 times every 1-2 hours when you are awake. Take your time and take a few normal breaths between deep breaths. 9. The spirometer may include an indicator to show your best effort. Use the indicator as a goal to work toward during each repetition. 10. After each set of 10 deep breaths, practice coughing to be sure your lungs are clear. If you have an incision (the cut made at the time of surgery), support your incision when coughing by placing a pillow or rolled up towels firmly against it. Once you are able to get out of bed, walk around indoors and cough well. You may stop using the incentive spirometer when instructed by your caregiver.  RISKS AND COMPLICATIONS  Take your time so you do not get  dizzy or light-headed.  If you are in pain, you may need to take or ask for pain medication before doing incentive spirometry. It is harder to take a deep breath if you are having  pain. AFTER USE  Rest and breathe slowly and easily.  It can be helpful to keep track of a log of your progress. Your caregiver can provide you with a simple table to help with this. If you are using the spirometer at home, follow these instructions: [place] IF:   You are having difficultly using the spirometer.  You have trouble using the spirometer as often as instructed.  Your pain medication is not giving enough relief while using the spirometer.  You develop fever of 100.5 F (38.1 C) or higher. SEEK IMMEDIATE MEDICAL CARE IF:   You cough up bloody sputum that had not been present before.  You develop fever of 102 F (38.9 C) or greater.  You develop worsening pain at or near the incision site. MAKE SURE YOU:   Understand these instructions.  Will watch your condition.  Will get help right away if you are not doing well or get worse. Document Released: 09/28/2006 Document Revised: 08/10/2011 Document Reviewed: 11/29/2006 ExitCare Patient Information 2014 ExitCare, [place].   ________________________________________________________________________  WHAT IS A BLOOD TRANSFUSION? Blood Transfusion Information  A transfusion is the replacement of blood or some of its parts. Blood is made up of multiple cells which provide different functions.  Red blood cells carry oxygen and are used for blood loss replacement.  White blood cells fight against infection.  Platelets control bleeding.  Plasma helps clot blood.  Other blood products are available for specialized needs, such as hemophilia or other clotting disorders. BEFORE THE TRANSFUSION  Who gives blood for transfusions?   Healthy volunteers who are fully evaluated to make sure their blood is safe. This is blood bank blood. Transfusion therapy is the safest it has ever been in the practice of medicine. Before blood is taken from a donor, a complete history is taken to make sure that person has no history  of diseases nor engages in risky social behavior (examples are intravenous drug use or sexual activity with multiple partners). The donor's travel history is screened to minimize risk of transmitting infections, such as malaria. The donated blood is tested for signs of infectious diseases, such as HIV and hepatitis. The blood is then tested to be sure it is compatible with you in order to minimize the chance of a transfusion reaction. If you or a relative donates blood, this is often done in anticipation of surgery and is not appropriate for emergency situations. It takes many days to process the donated blood. RISKS AND COMPLICATIONS Although transfusion therapy is very safe and saves many lives, the main dangers of transfusion include:   Getting an infectious disease.  Developing a transfusion reaction. This is an allergic reaction to something in the blood you were given. Every precaution is taken to prevent this. The decision to have a blood transfusion has been considered carefully by your caregiver before blood is given. Blood is not given unless the benefits outweigh the risks. AFTER THE TRANSFUSION  Right after receiving a blood transfusion, you will usually feel much better and more energetic. This is especially true if your red blood cells have gotten low (anemic). The transfusion raises the level of the red blood cells which carry oxygen, and this usually causes an energy increase.  The nurse administering the transfusion will  monitor you carefully for complications. HOME CARE INSTRUCTIONS  No special instructions are needed after a transfusion. You may find your energy is better. Speak with your caregiver about any limitations on activity for underlying diseases you may have. SEEK MEDICAL CARE IF:   Your condition is not improving after your transfusion.  You develop redness or irritation at the intravenous (IV) site. SEEK IMMEDIATE MEDICAL CARE IF:  Any of the following symptoms  occur over the next 12 hours:  Shaking chills.  You have a temperature by mouth above 102 F (38.9 C), not controlled by medicine.  Chest, back, or muscle pain.  People around you feel you are not acting correctly or are confused.  Shortness of breath or difficulty breathing.  Dizziness and fainting.  You get a rash or develop hives.  You have a decrease in urine output.  Your urine turns a dark color or changes to pink, red, or brown. Any of the following symptoms occur over the next 10 days:  You have a temperature by mouth above 102 F (38.9 C), not controlled by medicine.  Shortness of breath.  Weakness after normal activity.  The white part of the eye turns yellow (jaundice).  You have a decrease in the amount of urine or are urinating less often.  Your urine turns a dark color or changes to pink, red, or brown. Document Released: 05/15/2000 Document Revised: 08/10/2011 Document Reviewed: 01/02/2008 [facility] Patient Information 2014 [place], [place].  _______________________________________________________________________

## 2017-01-25 ENCOUNTER — Encounter (HOSPITAL_COMMUNITY)
Admission: RE | Disposition: A | Discharge status: Home / Self Care | Payer: Self-pay | Source: Ambulatory Visit | Attending: Internal Medicine

## 2017-01-25 ENCOUNTER — Inpatient Hospital Stay (HOSPITAL_COMMUNITY): Payer: Medicare Other | Admitting: Certified Registered Nurse Anesthetist

## 2017-01-25 ENCOUNTER — Encounter (HOSPITAL_COMMUNITY): Payer: Self-pay | Admitting: *Deleted

## 2017-01-25 ENCOUNTER — Inpatient Hospital Stay (HOSPITAL_COMMUNITY)
Admission: RE | Admit: 2017-01-25 | Discharge: 2017-02-09 | DRG: 470 | Disposition: A | Discharge status: Home / Self Care | Payer: Medicare Other | Source: Ambulatory Visit | Attending: Internal Medicine | Admitting: Internal Medicine

## 2017-01-25 DIAGNOSIS — K560 Paralytic ileus: Secondary | ICD-10-CM

## 2017-01-25 DIAGNOSIS — I10 Essential (primary) hypertension: Secondary | ICD-10-CM | POA: Diagnosis present

## 2017-01-25 DIAGNOSIS — Z833 Family history of diabetes mellitus: Secondary | ICD-10-CM

## 2017-01-25 DIAGNOSIS — R079 Chest pain, unspecified: Secondary | ICD-10-CM | POA: Diagnosis not present

## 2017-01-25 DIAGNOSIS — R0902 Hypoxemia: Secondary | ICD-10-CM | POA: Diagnosis not present

## 2017-01-25 DIAGNOSIS — Z886 Allergy status to analgesic agent status: Secondary | ICD-10-CM

## 2017-01-25 DIAGNOSIS — X58XXXA Exposure to other specified factors, initial encounter: Secondary | ICD-10-CM | POA: Diagnosis present

## 2017-01-25 DIAGNOSIS — Z87891 Personal history of nicotine dependence: Secondary | ICD-10-CM

## 2017-01-25 DIAGNOSIS — Y999 Unspecified external cause status: Secondary | ICD-10-CM | POA: Diagnosis not present

## 2017-01-25 DIAGNOSIS — L03113 Cellulitis of right upper limb: Secondary | ICD-10-CM | POA: Diagnosis not present

## 2017-01-25 DIAGNOSIS — I350 Nonrheumatic aortic (valve) stenosis: Secondary | ICD-10-CM | POA: Diagnosis not present

## 2017-01-25 DIAGNOSIS — F4310 Post-traumatic stress disorder, unspecified: Secondary | ICD-10-CM | POA: Diagnosis present

## 2017-01-25 DIAGNOSIS — J449 Chronic obstructive pulmonary disease, unspecified: Secondary | ICD-10-CM | POA: Diagnosis present

## 2017-01-25 DIAGNOSIS — R6510 Systemic inflammatory response syndrome (SIRS) of non-infectious origin without acute organ dysfunction: Secondary | ICD-10-CM | POA: Diagnosis not present

## 2017-01-25 DIAGNOSIS — F0390 Unspecified dementia without behavioral disturbance: Secondary | ICD-10-CM | POA: Diagnosis present

## 2017-01-25 DIAGNOSIS — I252 Old myocardial infarction: Secondary | ICD-10-CM

## 2017-01-25 DIAGNOSIS — Z881 Allergy status to other antibiotic agents status: Secondary | ICD-10-CM

## 2017-01-25 DIAGNOSIS — L03116 Cellulitis of left lower limb: Secondary | ICD-10-CM | POA: Diagnosis not present

## 2017-01-25 DIAGNOSIS — E7800 Pure hypercholesterolemia, unspecified: Secondary | ICD-10-CM | POA: Diagnosis present

## 2017-01-25 DIAGNOSIS — R110 Nausea: Secondary | ICD-10-CM

## 2017-01-25 DIAGNOSIS — Z818 Family history of other mental and behavioral disorders: Secondary | ICD-10-CM

## 2017-01-25 DIAGNOSIS — Z7982 Long term (current) use of aspirin: Secondary | ICD-10-CM

## 2017-01-25 DIAGNOSIS — I110 Hypertensive heart disease with heart failure: Secondary | ICD-10-CM | POA: Diagnosis present

## 2017-01-25 DIAGNOSIS — E669 Obesity, unspecified: Secondary | ICD-10-CM | POA: Diagnosis present

## 2017-01-25 DIAGNOSIS — Y92230 Patient room in hospital as the place of occurrence of the external cause: Secondary | ICD-10-CM | POA: Diagnosis not present

## 2017-01-25 DIAGNOSIS — Z91030 Bee allergy status: Secondary | ICD-10-CM

## 2017-01-25 DIAGNOSIS — Z9889 Other specified postprocedural states: Secondary | ICD-10-CM

## 2017-01-25 DIAGNOSIS — Z885 Allergy status to narcotic agent status: Secondary | ICD-10-CM

## 2017-01-25 DIAGNOSIS — I700 Atherosclerosis of aorta: Secondary | ICD-10-CM | POA: Diagnosis present

## 2017-01-25 DIAGNOSIS — Z8673 Personal history of transient ischemic attack (TIA), and cerebral infarction without residual deficits: Secondary | ICD-10-CM

## 2017-01-25 DIAGNOSIS — M1712 Unilateral primary osteoarthritis, left knee: Secondary | ICD-10-CM | POA: Diagnosis present

## 2017-01-25 DIAGNOSIS — F329 Major depressive disorder, single episode, unspecified: Secondary | ICD-10-CM | POA: Diagnosis present

## 2017-01-25 DIAGNOSIS — K9189 Other postprocedural complications and disorders of digestive system: Secondary | ICD-10-CM | POA: Diagnosis present

## 2017-01-25 DIAGNOSIS — I959 Hypotension, unspecified: Secondary | ICD-10-CM | POA: Diagnosis not present

## 2017-01-25 DIAGNOSIS — Z96651 Presence of right artificial knee joint: Secondary | ICD-10-CM | POA: Diagnosis present

## 2017-01-25 DIAGNOSIS — G8929 Other chronic pain: Secondary | ICD-10-CM | POA: Diagnosis present

## 2017-01-25 DIAGNOSIS — Z8614 Personal history of Methicillin resistant Staphylococcus aureus infection: Secondary | ICD-10-CM

## 2017-01-25 DIAGNOSIS — N179 Acute kidney failure, unspecified: Secondary | ICD-10-CM | POA: Diagnosis not present

## 2017-01-25 DIAGNOSIS — Z6830 Body mass index (BMI) 30.0-30.9, adult: Secondary | ICD-10-CM | POA: Diagnosis not present

## 2017-01-25 DIAGNOSIS — Z7951 Long term (current) use of inhaled steroids: Secondary | ICD-10-CM

## 2017-01-25 DIAGNOSIS — E1142 Type 2 diabetes mellitus with diabetic polyneuropathy: Secondary | ICD-10-CM | POA: Diagnosis present

## 2017-01-25 DIAGNOSIS — Z823 Family history of stroke: Secondary | ICD-10-CM

## 2017-01-25 DIAGNOSIS — E1151 Type 2 diabetes mellitus with diabetic peripheral angiopathy without gangrene: Secondary | ICD-10-CM | POA: Diagnosis present

## 2017-01-25 DIAGNOSIS — I951 Orthostatic hypotension: Secondary | ICD-10-CM | POA: Diagnosis not present

## 2017-01-25 DIAGNOSIS — R748 Abnormal levels of other serum enzymes: Secondary | ICD-10-CM | POA: Diagnosis not present

## 2017-01-25 DIAGNOSIS — M25761 Osteophyte, right knee: Secondary | ICD-10-CM | POA: Diagnosis present

## 2017-01-25 DIAGNOSIS — R059 Cough, unspecified: Secondary | ICD-10-CM

## 2017-01-25 DIAGNOSIS — K567 Ileus, unspecified: Secondary | ICD-10-CM

## 2017-01-25 DIAGNOSIS — I2510 Atherosclerotic heart disease of native coronary artery without angina pectoris: Secondary | ICD-10-CM | POA: Diagnosis present

## 2017-01-25 DIAGNOSIS — Z86010 Personal history of colonic polyps: Secondary | ICD-10-CM

## 2017-01-25 DIAGNOSIS — M1710 Unilateral primary osteoarthritis, unspecified knee: Secondary | ICD-10-CM | POA: Diagnosis present

## 2017-01-25 DIAGNOSIS — Z79899 Other long term (current) drug therapy: Secondary | ICD-10-CM

## 2017-01-25 DIAGNOSIS — Z8249 Family history of ischemic heart disease and other diseases of the circulatory system: Secondary | ICD-10-CM

## 2017-01-25 DIAGNOSIS — M175 Other unilateral secondary osteoarthritis of knee: Secondary | ICD-10-CM | POA: Diagnosis not present

## 2017-01-25 DIAGNOSIS — Z807 Family history of other malignant neoplasms of lymphoid, hematopoietic and related tissues: Secondary | ICD-10-CM

## 2017-01-25 DIAGNOSIS — Z888 Allergy status to other drugs, medicaments and biological substances status: Secondary | ICD-10-CM

## 2017-01-25 DIAGNOSIS — I248 Other forms of acute ischemic heart disease: Secondary | ICD-10-CM | POA: Diagnosis present

## 2017-01-25 DIAGNOSIS — E785 Hyperlipidemia, unspecified: Secondary | ICD-10-CM | POA: Diagnosis present

## 2017-01-25 DIAGNOSIS — Z8719 Personal history of other diseases of the digestive system: Secondary | ICD-10-CM

## 2017-01-25 DIAGNOSIS — M542 Cervicalgia: Secondary | ICD-10-CM | POA: Diagnosis present

## 2017-01-25 DIAGNOSIS — R112 Nausea with vomiting, unspecified: Secondary | ICD-10-CM | POA: Diagnosis not present

## 2017-01-25 DIAGNOSIS — I480 Paroxysmal atrial fibrillation: Secondary | ICD-10-CM | POA: Diagnosis not present

## 2017-01-25 DIAGNOSIS — I5032 Chronic diastolic (congestive) heart failure: Secondary | ICD-10-CM

## 2017-01-25 DIAGNOSIS — K219 Gastro-esophageal reflux disease without esophagitis: Secondary | ICD-10-CM | POA: Diagnosis present

## 2017-01-25 DIAGNOSIS — I080 Rheumatic disorders of both mitral and aortic valves: Secondary | ICD-10-CM | POA: Diagnosis present

## 2017-01-25 DIAGNOSIS — I6521 Occlusion and stenosis of right carotid artery: Secondary | ICD-10-CM | POA: Diagnosis present

## 2017-01-25 DIAGNOSIS — R05 Cough: Secondary | ICD-10-CM

## 2017-01-25 DIAGNOSIS — K56609 Unspecified intestinal obstruction, unspecified as to partial versus complete obstruction: Secondary | ICD-10-CM

## 2017-01-25 DIAGNOSIS — S80222A Blister (nonthermal), left knee, initial encounter: Secondary | ICD-10-CM | POA: Diagnosis not present

## 2017-01-25 DIAGNOSIS — H9190 Unspecified hearing loss, unspecified ear: Secondary | ICD-10-CM | POA: Diagnosis present

## 2017-01-25 DIAGNOSIS — E876 Hypokalemia: Secondary | ICD-10-CM | POA: Diagnosis present

## 2017-01-25 DIAGNOSIS — I4891 Unspecified atrial fibrillation: Secondary | ICD-10-CM | POA: Diagnosis not present

## 2017-01-25 DIAGNOSIS — R109 Unspecified abdominal pain: Secondary | ICD-10-CM

## 2017-01-25 DIAGNOSIS — Z86718 Personal history of other venous thrombosis and embolism: Secondary | ICD-10-CM

## 2017-01-25 DIAGNOSIS — M179 Osteoarthritis of knee, unspecified: Secondary | ICD-10-CM

## 2017-01-25 DIAGNOSIS — I483 Typical atrial flutter: Secondary | ICD-10-CM | POA: Diagnosis not present

## 2017-01-25 DIAGNOSIS — Z9049 Acquired absence of other specified parts of digestive tract: Secondary | ICD-10-CM

## 2017-01-25 DIAGNOSIS — R296 Repeated falls: Secondary | ICD-10-CM | POA: Diagnosis present

## 2017-01-25 DIAGNOSIS — Z87442 Personal history of urinary calculi: Secondary | ICD-10-CM

## 2017-01-25 DIAGNOSIS — Z91041 Radiographic dye allergy status: Secondary | ICD-10-CM

## 2017-01-25 HISTORY — PX: TOTAL KNEE ARTHROPLASTY: SHX125

## 2017-01-25 LAB — TYPE AND SCREEN
ABO/RH(D): O POS
Antibody Screen: NEGATIVE

## 2017-01-25 LAB — SURGICAL PCR SCREEN
MRSA, PCR: NEGATIVE
Staphylococcus aureus: NEGATIVE

## 2017-01-25 LAB — GLUCOSE, CAPILLARY
GLUCOSE-CAPILLARY: 123 mg/dL — AB (ref 65–99)
Glucose-Capillary: 134 mg/dL — ABNORMAL HIGH (ref 65–99)
Glucose-Capillary: 160 mg/dL — ABNORMAL HIGH (ref 65–99)

## 2017-01-25 LAB — ABO/RH: ABO/RH(D): O POS

## 2017-01-25 SURGERY — ARTHROPLASTY, KNEE, TOTAL
Anesthesia: Spinal | Site: Knee | Laterality: Left

## 2017-01-25 MED ORDER — RIVAROXABAN 10 MG PO TABS
10.0000 mg | ORAL_TABLET | Freq: Every day | ORAL | Status: DC
  Administered 2017-01-26 – 2017-01-29 (×4): 10 mg via ORAL
  Filled 2017-01-25 (×4): qty 1

## 2017-01-25 MED ORDER — DIPHENHYDRAMINE HCL 12.5 MG/5ML PO ELIX: 12.5000 mg | ORAL_SOLUTION | ORAL | Status: DC | PRN

## 2017-01-25 MED ORDER — ALBUTEROL SULFATE (2.5 MG/3ML) 0.083% IN NEBU
INHALATION_SOLUTION | RESPIRATORY_TRACT | Status: AC
  Filled 2017-01-25: qty 3

## 2017-01-25 MED ORDER — DEXAMETHASONE SODIUM PHOSPHATE 10 MG/ML IJ SOLN
10.0000 mg | Freq: Once | INTRAMUSCULAR | Status: AC
  Administered 2017-01-26: 10 mg via INTRAVENOUS
  Filled 2017-01-25: qty 1

## 2017-01-25 MED ORDER — TRANEXAMIC ACID 1000 MG/10ML IV SOLN
2000.0000 mg | Freq: Once | INTRAVENOUS | Status: DC
  Filled 2017-01-25: qty 20

## 2017-01-25 MED ORDER — ALBUTEROL SULFATE HFA 108 (90 BASE) MCG/ACT IN AERS: 2.0000 | INHALATION_SPRAY | RESPIRATORY_TRACT | Status: DC | PRN

## 2017-01-25 MED ORDER — IRBESARTAN-HYDROCHLOROTHIAZIDE 300-12.5 MG PO TABS: 1.0000 | ORAL_TABLET | Freq: Every day | ORAL | Status: DC

## 2017-01-25 MED ORDER — STERILE WATER FOR IRRIGATION IR SOLN
Status: DC | PRN
  Administered 2017-01-25: 2000 mL

## 2017-01-25 MED ORDER — CEFAZOLIN SODIUM-DEXTROSE 2-4 GM/100ML-% IV SOLN
2.0000 g | Freq: Four times a day (QID) | INTRAVENOUS | Status: AC
  Administered 2017-01-25 (×2): 2 g via INTRAVENOUS
  Filled 2017-01-25 (×2): qty 100

## 2017-01-25 MED ORDER — BUPIVACAINE LIPOSOME 1.3 % IJ SUSP
20.0000 mL | Freq: Once | INTRAMUSCULAR | Status: DC
  Filled 2017-01-25 (×4): qty 20

## 2017-01-25 MED ORDER — FLEET ENEMA 7-19 GM/118ML RE ENEM: 1.0000 | ENEMA | Freq: Once | RECTAL | Status: DC | PRN

## 2017-01-25 MED ORDER — NALDEMEDINE TOSYLATE 0.2 MG PO TABS
0.2000 mg | ORAL_TABLET | Freq: Every day | ORAL | Status: DC
  Administered 2017-01-29: 0.2 mg via ORAL

## 2017-01-25 MED ORDER — FENTANYL CITRATE (PF) 100 MCG/2ML IJ SOLN: 100.0000 ug | Freq: Once | INTRAMUSCULAR | Status: DC

## 2017-01-25 MED ORDER — ONDANSETRON HCL 4 MG/2ML IJ SOLN
INTRAMUSCULAR | Status: AC
  Filled 2017-01-25: qty 2

## 2017-01-25 MED ORDER — PROPOFOL 10 MG/ML IV BOLUS
INTRAVENOUS | Status: AC
  Filled 2017-01-25: qty 40

## 2017-01-25 MED ORDER — ALPRAZOLAM 1 MG PO TABS
1.0000 mg | ORAL_TABLET | Freq: Three times a day (TID) | ORAL | Status: DC | PRN
  Administered 2017-01-25 – 2017-01-27 (×2): 1 mg via ORAL
  Administered 2017-01-27 – 2017-02-08 (×7): 2 mg via ORAL
  Administered 2017-02-09 (×2): 1 mg via ORAL
  Filled 2017-01-25 (×3): qty 2
  Filled 2017-01-25 (×2): qty 1
  Filled 2017-01-25 (×4): qty 2
  Filled 2017-01-25: qty 1
  Filled 2017-01-25: qty 2

## 2017-01-25 MED ORDER — ALBUTEROL SULFATE HFA 108 (90 BASE) MCG/ACT IN AERS
INHALATION_SPRAY | RESPIRATORY_TRACT | Status: AC
  Filled 2017-01-25: qty 6.7

## 2017-01-25 MED ORDER — ONDANSETRON HCL 4 MG/2ML IJ SOLN
INTRAMUSCULAR | Status: DC | PRN
  Administered 2017-01-25: 4 mg via INTRAVENOUS

## 2017-01-25 MED ORDER — ALBUTEROL SULFATE (2.5 MG/3ML) 0.083% IN NEBU
2.5000 mg | INHALATION_SOLUTION | Freq: Four times a day (QID) | RESPIRATORY_TRACT | Status: DC | PRN
  Administered 2017-01-25 – 2017-01-30 (×2): 2.5 mg via RESPIRATORY_TRACT
  Filled 2017-01-25 (×2): qty 3

## 2017-01-25 MED ORDER — MENTHOL 3 MG MT LOZG
1.0000 | LOZENGE | OROMUCOSAL | Status: DC | PRN
  Administered 2017-02-05: 3 mg via ORAL
  Filled 2017-01-25: qty 9

## 2017-01-25 MED ORDER — PHENOL 1.4 % MT LIQD: 1.0000 | OROMUCOSAL | Status: DC | PRN

## 2017-01-25 MED ORDER — ROPIVACAINE HCL 7.5 MG/ML IJ SOLN
INTRAMUSCULAR | Status: DC | PRN
  Administered 2017-01-25: 20 mL via PERINEURAL

## 2017-01-25 MED ORDER — PROMETHAZINE HCL 25 MG PO TABS
25.0000 mg | ORAL_TABLET | Freq: Four times a day (QID) | ORAL | Status: DC | PRN
  Administered 2017-01-26 – 2017-01-28 (×6): 25 mg via ORAL
  Filled 2017-01-25 (×6): qty 1

## 2017-01-25 MED ORDER — GABAPENTIN 300 MG PO CAPS: 300.0000 mg | ORAL_CAPSULE | Freq: Once | ORAL | Status: DC

## 2017-01-25 MED ORDER — MIDAZOLAM HCL 2 MG/2ML IJ SOLN: 2.0000 mg | Freq: Once | INTRAMUSCULAR | Status: DC

## 2017-01-25 MED ORDER — HYDROMORPHONE HCL 2 MG PO TABS: 4.0000 mg | ORAL_TABLET | ORAL | Status: DC | PRN

## 2017-01-25 MED ORDER — SODIUM CHLORIDE 0.9 % IJ SOLN
INTRAMUSCULAR | Status: AC
  Filled 2017-01-25: qty 50

## 2017-01-25 MED ORDER — CYCLOBENZAPRINE HCL 5 MG PO TABS
5.0000 mg | ORAL_TABLET | Freq: Three times a day (TID) | ORAL | Status: DC | PRN
  Administered 2017-01-25 – 2017-02-03 (×9): 5 mg via ORAL
  Filled 2017-01-25 (×9): qty 1

## 2017-01-25 MED ORDER — ALBUTEROL SULFATE (2.5 MG/3ML) 0.083% IN NEBU
2.5000 mg | INHALATION_SOLUTION | Freq: Three times a day (TID) | RESPIRATORY_TRACT | Status: DC
  Administered 2017-01-26 (×3): 2.5 mg via RESPIRATORY_TRACT
  Filled 2017-01-25 (×3): qty 3

## 2017-01-25 MED ORDER — ACETAMINOPHEN 10 MG/ML IV SOLN
1000.0000 mg | Freq: Once | INTRAVENOUS | Status: AC
  Administered 2017-01-25: 1000 mg via INTRAVENOUS

## 2017-01-25 MED ORDER — FLUTICASONE PROPIONATE 50 MCG/ACT NA SUSP
1.0000 | Freq: Every day | NASAL | Status: DC
  Administered 2017-01-26 – 2017-02-09 (×12): 1 via NASAL
  Filled 2017-01-25 (×2): qty 16

## 2017-01-25 MED ORDER — BENZONATATE 100 MG PO CAPS: 100.0000 mg | ORAL_CAPSULE | Freq: Three times a day (TID) | ORAL | Status: DC | PRN

## 2017-01-25 MED ORDER — CEFAZOLIN SODIUM-DEXTROSE 2-4 GM/100ML-% IV SOLN
INTRAVENOUS | Status: AC
  Filled 2017-01-25: qty 100

## 2017-01-25 MED ORDER — FLUTICASONE FUROATE-VILANTEROL 200-25 MCG/INH IN AEPB
1.0000 | INHALATION_SPRAY | Freq: Every day | RESPIRATORY_TRACT | Status: DC
  Administered 2017-01-26 – 2017-02-09 (×9): 1 via RESPIRATORY_TRACT
  Filled 2017-01-25 (×2): qty 28

## 2017-01-25 MED ORDER — TRAMADOL HCL 50 MG PO TABS
50.0000 mg | ORAL_TABLET | Freq: Four times a day (QID) | ORAL | Status: DC | PRN
  Administered 2017-01-25 – 2017-01-28 (×7): 100 mg via ORAL
  Filled 2017-01-25 (×7): qty 2

## 2017-01-25 MED ORDER — BUPIVACAINE IN DEXTROSE 0.75-8.25 % IT SOLN
INTRATHECAL | Status: DC | PRN
  Administered 2017-01-25: 1.8 mL via INTRATHECAL

## 2017-01-25 MED ORDER — BISACODYL 10 MG RE SUPP
10.0000 mg | Freq: Every day | RECTAL | Status: DC | PRN
  Administered 2017-01-30: 10 mg via RECTAL
  Filled 2017-01-25: qty 1

## 2017-01-25 MED ORDER — HYDROCHLOROTHIAZIDE 12.5 MG PO CAPS
12.5000 mg | ORAL_CAPSULE | Freq: Every day | ORAL | Status: DC
  Administered 2017-01-26 – 2017-01-28 (×3): 12.5 mg via ORAL
  Filled 2017-01-25 (×3): qty 1

## 2017-01-25 MED ORDER — SODIUM CHLORIDE 0.9 % IR SOLN
Status: DC | PRN
  Administered 2017-01-25: 1000 mL

## 2017-01-25 MED ORDER — SODIUM CHLORIDE 0.9 % IJ SOLN
INTRAMUSCULAR | Status: DC | PRN
  Administered 2017-01-25 (×2): 30 mL

## 2017-01-25 MED ORDER — ACETAMINOPHEN 325 MG PO TABS: 650.0000 mg | ORAL_TABLET | Freq: Four times a day (QID) | ORAL | Status: DC | PRN

## 2017-01-25 MED ORDER — METOCLOPRAMIDE HCL 5 MG/ML IJ SOLN
5.0000 mg | Freq: Three times a day (TID) | INTRAMUSCULAR | Status: DC | PRN
  Administered 2017-01-29 – 2017-02-05 (×10): 10 mg via INTRAVENOUS
  Filled 2017-01-25 (×11): qty 2

## 2017-01-25 MED ORDER — POLYETHYLENE GLYCOL 3350 17 G PO PACK
17.0000 g | PACK | Freq: Every day | ORAL | Status: DC | PRN
  Administered 2017-01-28: 17 g via ORAL
  Filled 2017-01-25: qty 1

## 2017-01-25 MED ORDER — TRANEXAMIC ACID 1000 MG/10ML IV SOLN
INTRAVENOUS | Status: AC | PRN
  Administered 2017-01-25: 2000 mg via TOPICAL

## 2017-01-25 MED ORDER — IRBESARTAN 150 MG PO TABS
300.0000 mg | ORAL_TABLET | Freq: Every day | ORAL | Status: DC
  Administered 2017-01-26 – 2017-01-28 (×3): 300 mg via ORAL
  Filled 2017-01-25 (×3): qty 2

## 2017-01-25 MED ORDER — LACTATED RINGERS IV SOLN
INTRAVENOUS | Status: DC
  Administered 2017-01-25: 11:00:00 via INTRAVENOUS

## 2017-01-25 MED ORDER — BUPIVACAINE LIPOSOME 1.3 % IJ SUSP
INTRAMUSCULAR | Status: DC | PRN
  Administered 2017-01-25 (×2): 10 mL

## 2017-01-25 MED ORDER — PROPOFOL 10 MG/ML IV BOLUS
INTRAVENOUS | Status: AC
  Filled 2017-01-25: qty 20

## 2017-01-25 MED ORDER — ONDANSETRON HCL 4 MG PO TABS
4.0000 mg | ORAL_TABLET | Freq: Four times a day (QID) | ORAL | Status: DC | PRN
  Administered 2017-01-25 – 2017-01-28 (×6): 4 mg via ORAL
  Filled 2017-01-25 (×6): qty 1

## 2017-01-25 MED ORDER — NITROGLYCERIN 0.4 MG SL SUBL: 0.4000 mg | SUBLINGUAL_TABLET | SUBLINGUAL | Status: DC | PRN

## 2017-01-25 MED ORDER — PHENYLEPHRINE HCL 10 MG/ML IJ SOLN
INTRAMUSCULAR | Status: DC | PRN
  Administered 2017-01-25 (×2): 80 ug via INTRAVENOUS

## 2017-01-25 MED ORDER — HYDRALAZINE HCL 25 MG PO TABS
25.0000 mg | ORAL_TABLET | Freq: Every day | ORAL | Status: DC | PRN
  Administered 2017-01-26 – 2017-01-27 (×2): 25 mg via ORAL
  Filled 2017-01-25 (×3): qty 1

## 2017-01-25 MED ORDER — FENTANYL CITRATE (PF) 100 MCG/2ML IJ SOLN
INTRAMUSCULAR | Status: DC | PRN
  Administered 2017-01-25 (×2): 50 ug via INTRAVENOUS

## 2017-01-25 MED ORDER — HYDROMORPHONE HCL-NACL 0.5-0.9 MG/ML-% IV SOSY: 0.5000 mg | PREFILLED_SYRINGE | INTRAVENOUS | Status: DC | PRN

## 2017-01-25 MED ORDER — PROMETHAZINE HCL 25 MG/ML IJ SOLN: 6.2500 mg | INTRAMUSCULAR | Status: DC | PRN

## 2017-01-25 MED ORDER — EPHEDRINE 5 MG/ML INJ
INTRAVENOUS | Status: AC
Start: 2017-01-25 — End: ?
  Filled 2017-01-25: qty 10

## 2017-01-25 MED ORDER — DEXAMETHASONE SODIUM PHOSPHATE 10 MG/ML IJ SOLN
10.0000 mg | Freq: Once | INTRAMUSCULAR | Status: AC
  Administered 2017-01-25: 10 mg via INTRAVENOUS

## 2017-01-25 MED ORDER — DEXAMETHASONE SODIUM PHOSPHATE 10 MG/ML IJ SOLN
INTRAMUSCULAR | Status: AC
  Filled 2017-01-25: qty 1

## 2017-01-25 MED ORDER — 0.9 % SODIUM CHLORIDE (POUR BTL) OPTIME
TOPICAL | Status: DC | PRN
  Administered 2017-01-25: 1000 mL

## 2017-01-25 MED ORDER — MORPHINE SULFATE 15 MG PO TABS
30.0000 mg | ORAL_TABLET | ORAL | Status: DC | PRN
  Administered 2017-01-25 – 2017-01-26 (×4): 30 mg via ORAL
  Filled 2017-01-25 (×4): qty 2

## 2017-01-25 MED ORDER — DOCUSATE SODIUM 100 MG PO CAPS
100.0000 mg | ORAL_CAPSULE | Freq: Two times a day (BID) | ORAL | Status: DC
  Administered 2017-01-25 – 2017-02-09 (×15): 100 mg via ORAL
  Filled 2017-01-25 (×21): qty 1

## 2017-01-25 MED ORDER — ONDANSETRON HCL 4 MG/2ML IJ SOLN
4.0000 mg | Freq: Four times a day (QID) | INTRAMUSCULAR | Status: DC | PRN
  Administered 2017-01-26 – 2017-02-07 (×25): 4 mg via INTRAVENOUS
  Filled 2017-01-25 (×29): qty 2

## 2017-01-25 MED ORDER — MORPHINE SULFATE (PF) 2 MG/ML IV SOLN
2.0000 mg | INTRAVENOUS | Status: DC | PRN
  Administered 2017-01-25 – 2017-01-26 (×8): 2 mg via INTRAVENOUS
  Administered 2017-01-26: 3 mg via INTRAVENOUS
  Administered 2017-01-26: 2 mg via INTRAVENOUS
  Administered 2017-01-27 (×2): 3 mg via INTRAVENOUS
  Administered 2017-01-28: 2 mg via INTRAVENOUS
  Administered 2017-01-29 (×3): 3 mg via INTRAVENOUS
  Administered 2017-01-29 – 2017-01-30 (×4): 2 mg via INTRAVENOUS
  Administered 2017-01-30: 3 mg via INTRAVENOUS
  Administered 2017-01-30 – 2017-02-03 (×26): 2 mg via INTRAVENOUS
  Filled 2017-01-25 (×6): qty 1
  Filled 2017-01-25: qty 2
  Filled 2017-01-25 (×4): qty 1
  Filled 2017-01-25: qty 2
  Filled 2017-01-25 (×5): qty 1
  Filled 2017-01-25: qty 2
  Filled 2017-01-25 (×3): qty 1
  Filled 2017-01-25: qty 2
  Filled 2017-01-25 (×12): qty 1
  Filled 2017-01-25: qty 2
  Filled 2017-01-25 (×5): qty 1
  Filled 2017-01-25: qty 2
  Filled 2017-01-25 (×2): qty 1
  Filled 2017-01-25: qty 2
  Filled 2017-01-25 (×3): qty 1
  Filled 2017-01-25: qty 2
  Filled 2017-01-25 (×2): qty 1

## 2017-01-25 MED ORDER — FENTANYL CITRATE (PF) 100 MCG/2ML IJ SOLN
INTRAMUSCULAR | Status: AC
  Filled 2017-01-25: qty 2

## 2017-01-25 MED ORDER — PROPOFOL 500 MG/50ML IV EMUL
INTRAVENOUS | Status: DC | PRN
  Administered 2017-01-25: 50 ug/kg/min via INTRAVENOUS

## 2017-01-25 MED ORDER — MORPHINE SULFATE (PF) 4 MG/ML IV SOLN: 1.0000 mg | INTRAVENOUS | Status: DC | PRN

## 2017-01-25 MED ORDER — ALBUTEROL SULFATE (2.5 MG/3ML) 0.083% IN NEBU
2.5000 mg | INHALATION_SOLUTION | Freq: Once | RESPIRATORY_TRACT | Status: AC
  Administered 2017-01-25: 2.5 mg via RESPIRATORY_TRACT

## 2017-01-25 MED ORDER — METOCLOPRAMIDE HCL 5 MG PO TABS
5.0000 mg | ORAL_TABLET | Freq: Three times a day (TID) | ORAL | Status: DC | PRN
  Administered 2017-01-28 – 2017-01-29 (×2): 10 mg via ORAL
  Filled 2017-01-25 (×2): qty 2

## 2017-01-25 MED ORDER — SODIUM CHLORIDE 0.9 % IJ SOLN
INTRAMUSCULAR | Status: AC
  Filled 2017-01-25: qty 10

## 2017-01-25 MED ORDER — SODIUM CHLORIDE 0.9 % IV SOLN
INTRAVENOUS | Status: DC
  Administered 2017-01-25: 16:00:00 via INTRAVENOUS

## 2017-01-25 MED ORDER — CEFAZOLIN SODIUM-DEXTROSE 2-4 GM/100ML-% IV SOLN
2.0000 g | INTRAVENOUS | Status: AC
  Administered 2017-01-25: 2 g via INTRAVENOUS

## 2017-01-25 MED ORDER — CARVEDILOL 25 MG PO TABS
25.0000 mg | ORAL_TABLET | Freq: Two times a day (BID) | ORAL | Status: DC
  Administered 2017-01-25 – 2017-01-28 (×6): 25 mg via ORAL
  Filled 2017-01-25 (×7): qty 1

## 2017-01-25 MED ORDER — GABAPENTIN 300 MG PO CAPS
300.0000 mg | ORAL_CAPSULE | Freq: Once | ORAL | Status: AC
  Administered 2017-01-25: 300 mg via ORAL

## 2017-01-25 MED ORDER — ACETAMINOPHEN 10 MG/ML IV SOLN
INTRAVENOUS | Status: AC
  Filled 2017-01-25: qty 100

## 2017-01-25 MED ORDER — FUROSEMIDE 20 MG PO TABS: 20.0000 mg | ORAL_TABLET | Freq: Every day | ORAL | Status: DC | PRN

## 2017-01-25 MED ORDER — ALBUTEROL SULFATE HFA 108 (90 BASE) MCG/ACT IN AERS
INHALATION_SPRAY | RESPIRATORY_TRACT | Status: DC | PRN
  Administered 2017-01-25: 2 via RESPIRATORY_TRACT

## 2017-01-25 MED ORDER — ACETAMINOPHEN 650 MG RE SUPP: 650.0000 mg | Freq: Four times a day (QID) | RECTAL | Status: DC | PRN

## 2017-01-25 MED ORDER — EPHEDRINE SULFATE 50 MG/ML IJ SOLN
INTRAMUSCULAR | Status: DC | PRN
  Administered 2017-01-25 (×3): 5 mg via INTRAVENOUS
  Administered 2017-01-25: 10 mg via INTRAVENOUS

## 2017-01-25 MED ORDER — ACETAMINOPHEN 500 MG PO TABS
1000.0000 mg | ORAL_TABLET | Freq: Four times a day (QID) | ORAL | Status: AC
  Administered 2017-01-25 – 2017-01-26 (×4): 1000 mg via ORAL
  Filled 2017-01-25 (×4): qty 2

## 2017-01-25 MED ORDER — CHLORHEXIDINE GLUCONATE 4 % EX LIQD: 60.0000 mL | Freq: Once | CUTANEOUS | Status: DC

## 2017-01-25 MED ORDER — GABAPENTIN 300 MG PO CAPS
ORAL_CAPSULE | ORAL | Status: AC
  Filled 2017-01-25: qty 1

## 2017-01-25 SURGICAL SUPPLY — 48 items
BAG DECANTER FOR FLEXI CONT (MISCELLANEOUS) ×2 IMPLANT
BAG SPEC THK2 15X12 ZIP CLS (MISCELLANEOUS) ×1
BAG ZIPLOCK 12X15 (MISCELLANEOUS) ×2 IMPLANT
BANDAGE ACE 6X5 VEL STRL LF (GAUZE/BANDAGES/DRESSINGS) ×2 IMPLANT
BLADE SAG 18X100X1.27 (BLADE) ×2 IMPLANT
BLADE SAW SGTL 11.0X1.19X90.0M (BLADE) ×2 IMPLANT
BOWL SMART MIX CTS (DISPOSABLE) ×2 IMPLANT
CAP KNEE TOTAL 3 SIGMA ×1 IMPLANT
CEMENT HV SMART SET (Cement) ×4 IMPLANT
COVER SURGICAL LIGHT HANDLE (MISCELLANEOUS) ×2 IMPLANT
CUFF TOURN SGL QUICK 34 (TOURNIQUET CUFF) ×2
CUFF TRNQT CYL 34X4X40X1 (TOURNIQUET CUFF) ×1 IMPLANT
DECANTER SPIKE VIAL GLASS SM (MISCELLANEOUS) ×2 IMPLANT
DRAPE U-SHAPE 47X51 STRL (DRAPES) ×2 IMPLANT
DRSG ADAPTIC 3X8 NADH LF (GAUZE/BANDAGES/DRESSINGS) ×2 IMPLANT
DURAPREP 26ML APPLICATOR (WOUND CARE) ×2 IMPLANT
ELECT REM PT RETURN 15FT ADLT (MISCELLANEOUS) ×2 IMPLANT
EVACUATOR 1/8 PVC DRAIN (DRAIN) ×2 IMPLANT
GAUZE SPONGE 4X4 12PLY STRL (GAUZE/BANDAGES/DRESSINGS) ×2 IMPLANT
GLOVE BIO SURGEON STRL SZ8 (GLOVE) ×3 IMPLANT
GLOVE BIOGEL PI IND STRL 6.5 (GLOVE) IMPLANT
GLOVE BIOGEL PI IND STRL 8 (GLOVE) ×1 IMPLANT
GLOVE BIOGEL PI INDICATOR 6.5 (GLOVE) ×1
GLOVE BIOGEL PI INDICATOR 8 (GLOVE) ×2
GLOVE SURG SS PI 6.5 STRL IVOR (GLOVE) ×1 IMPLANT
GOWN STRL REUS W/TWL LRG LVL3 (GOWN DISPOSABLE) ×3 IMPLANT
GOWN STRL REUS W/TWL XL LVL3 (GOWN DISPOSABLE) ×2 IMPLANT
HANDPIECE INTERPULSE COAX TIP (DISPOSABLE) ×2
IMMOBILIZER KNEE 20 (SOFTGOODS) ×2
IMMOBILIZER KNEE 20 THIGH 36 (SOFTGOODS) ×1 IMPLANT
MANIFOLD NEPTUNE II (INSTRUMENTS) ×2 IMPLANT
NS IRRIG 1000ML POUR BTL (IV SOLUTION) ×2 IMPLANT
PACK TOTAL KNEE CUSTOM (KITS) ×2 IMPLANT
PAD ABD 8X10 STRL (GAUZE/BANDAGES/DRESSINGS) ×1 IMPLANT
PADDING CAST COTTON 6X4 STRL (CAST SUPPLIES) ×6 IMPLANT
POSITIONER SURGICAL ARM (MISCELLANEOUS) ×2 IMPLANT
SET HNDPC FAN SPRY TIP SCT (DISPOSABLE) ×1 IMPLANT
STRIP CLOSURE SKIN 1/2X4 (GAUZE/BANDAGES/DRESSINGS) ×4 IMPLANT
SUT MNCRL AB 4-0 PS2 18 (SUTURE) ×2 IMPLANT
SUT STRATAFIX 0 PDS 27 VIOLET (SUTURE) ×2
SUT VIC AB 2-0 CT1 27 (SUTURE) ×6
SUT VIC AB 2-0 CT1 TAPERPNT 27 (SUTURE) ×3 IMPLANT
SUTURE STRATFX 0 PDS 27 VIOLET (SUTURE) ×1 IMPLANT
SYR 50ML LL SCALE MARK (SYRINGE) ×2 IMPLANT
TRAY FOLEY W/METER SILVER 16FR (SET/KITS/TRAYS/PACK) ×2 IMPLANT
WATER STERILE IRR 1000ML POUR (IV SOLUTION) ×4 IMPLANT
WRAP KNEE MAXI GEL POST OP (GAUZE/BANDAGES/DRESSINGS) ×2 IMPLANT
YANKAUER SUCT BULB TIP 10FT TU (MISCELLANEOUS) ×2 IMPLANT

## 2017-01-25 NOTE — Interval H&P Note (Signed)
History and Physical Interval Note:  01/25/2017 10:19 AM  [name]  has presented today for surgery, with the diagnosis of Osteoarthritis Left Knee  The various methods of treatment have been discussed with the patient and family. After consideration of risks, benefits and other options for treatment, the patient has consented to  Procedure(s): LEFT TOTAL KNEE ARTHROPLASTY (Left) as a surgical intervention .  The patient's history has been reviewed, patient examined, no change in status, stable for surgery.  I have reviewed the patient's chart and labs.  Questions were answered to the patient's satisfaction.     [name]

## 2017-01-25 NOTE — Op Note (Signed)
OPERATIVE REPORT-TOTAL KNEE ARTHROPLASTY   Pre-operative diagnosis- Osteoarthritis  Left knee(s)  Post-operative diagnosis- Osteoarthritis Left knee(s)  Procedure-  Left  Total Knee Arthroplasty  Surgeon- William Plover. Ezella Kell, MD  Assistant- Ardeen Jourdain, PA-C   Anesthesia-  Adductor canal block and spinal  EBL-* No blood loss amount entered *   Drains Hemovac  Tourniquet time- 47 minutes @ 242 mm Hg    Complications- None  Condition-PACU - hemodynamically stable.   Brief Clinical Note   [name] is a 66 y.o. year old male with end stage OA of his left knee with progressively worsening pain and dysfunction. He has constant pain, with activity and at rest and significant functional deficits with difficulties even with ADLs. He has had extensive non-op management including analgesics, injections of cortisone and viscosupplements, and home exercise program, but remains in significant pain with significant dysfunction. Radiographs show bone on bone arthritis severe lateral and patellofemoral. He presents now for left Total Knee Arthroplasty.     Procedure in detail---   The patient is brought into the operating room and positioned supine on the operating table. After successful administration of  Adductor canal block and spinal,   a tourniquet is placed high on the  Left thigh(s) and the lower extremity is prepped and draped in the usual sterile fashion. Time out is performed by the operating team and then the  Left lower extremity is wrapped in Esmarch, knee flexed and the tourniquet inflated to 300 mmHg.       A midline incision is made with a ten blade through the subcutaneous tissue to the level of the extensor mechanism. A fresh blade is used to make a medial parapatellar arthrotomy. Soft tissue over the proximal medial tibia is subperiosteally elevated to the joint line with a knife and into the semimembranosus bursa with a Cobb elevator. Soft tissue over the proximal  lateral tibia is elevated with attention being paid to avoiding the patellar tendon on the tibial tubercle. The patella is everted, knee flexed 90 degrees and the ACL and PCL are removed. Findings are bone on bone all 3 compartments with massive global ostephytes        The drill is used to create a starting hole in the distal femur and the canal is thoroughly irrigated with sterile saline to remove the fatty contents. The 5 degree Left  valgus alignment guide is placed into the femoral canal and the distal femoral cutting block is pinned to remove 11 mm off the distal femur. Resection is made with an oscillating saw.      The tibia is subluxed forward and the menisci are removed. The extramedullary alignment guide is placed referencing proximally at the medial aspect of the tibial tubercle and distally along the second metatarsal axis and tibial crest. The block is pinned to remove 5mm off the more deficient lateral  side. Resection is made with an oscillating saw. Size 5is the most appropriate size for the tibia and the proximal tibia is prepared with the modular drill and keel punch for that size.      The femoral sizing guide is placed and size 6 is most appropriate. Rotation is marked off the epicondylar axis and confirmed by creating a rectangular flexion gap at 90 degrees. The size 6 cutting block is pinned in this rotation and the anterior, posterior and chamfer cuts are made with the oscillating saw. The intercondylar block is then placed and that cut is made.  Trial size 5 tibial component, trial size 6 posterior stabilized femur and a 12.5  mm posterior stabilized rotating platform insert trial is placed. Full extension is achieved with excellent varus/valgus and anterior/posterior balance throughout full range of motion. The patella is everted and thickness measured to be 28  mm. Free hand resection is taken to 15 mm, a 41 template is placed, lug holes are drilled, trial patella is placed, and it  tracks normally. Osteophytes are removed off the posterior femur with the trial in place. All trials are removed and the cut bone surfaces prepared with pulsatile lavage. Cement is mixed and once ready for implantation, the size 5 tibial implant, size  6 posterior stabilized femoral component, and the size 41 patella are cemented in place and the patella is held with the clamp. The trial insert is placed and the knee held in full extension. The Exparel (20 ml mixed with 60 ml saline) is injected into the extensor mechanism, posterior capsule, medial and lateral gutters and subcutaneous tissues.  All extruded cement is removed and once the cement is hard the permanent 12.5 mm posterior stabilized rotating platform insert is placed into the tibial tray.      The wound is copiously irrigated with saline solution and the extensor mechanism closed over a hemovac drain with #1 V-loc suture. The tourniquet is released for a total tourniquet time of 47  minutes. Flexion against gravity is 130 degrees and the patella tracks normally. Subcutaneous tissue is closed with 2.0 vicryl and subcuticular with running 4.0 Monocryl. The incision is cleaned and dried and steri-strips and a bulky sterile dressing are applied. The limb is placed into a knee immobilizer and the patient is awakened and transported to recovery in stable condition.      Please note that a surgical assistant was a medical necessity for this procedure in order to perform it in a safe and expeditious manner. Surgical assistant was necessary to retract the ligaments and vital neurovascular structures to prevent injury to them and also necessary for proper positioning of the limb to allow for anatomic placement of the prosthesis.   William Plover Lenae Wherley, MD    01/25/2017, 12:37 PM

## 2017-01-25 NOTE — Progress Notes (Signed)
Albuterol Nebulizer Breathing Treatment given- per Dr. Alene Mires order- made aware of patient  Wheezing and having received Albuterol Inhaler puffs at the end of the O.R. case

## 2017-01-25 NOTE — Discharge Instructions (Addendum)
° °Dr. Frank Aluisio °Total Joint Specialist °[facility] °[address] °[place], [place] [address] °[phone] ° °TOTAL KNEE REPLACEMENT POSTOPERATIVE DIRECTIONS ° °Knee Rehabilitation, Guidelines Following Surgery  °Results after knee surgery are often greatly improved when you follow the exercise, range of motion and muscle strengthening exercises prescribed by your doctor. Safety measures are also important to protect the knee from further injury. Any time any of these exercises cause you to have increased pain or swelling in your knee joint, decrease the amount until you are comfortable again and slowly increase them. If you have problems or questions, call your caregiver or physical therapist for advice.  ° °HOME CARE INSTRUCTIONS  °Remove items at home which could result in a fall. This includes throw rugs or furniture in walking pathways.  °· ICE to the affected knee every three hours for 30 minutes at a time and then as needed for pain and swelling.  Continue to use ice on the knee for pain and swelling from surgery. You may notice swelling that will progress down to the foot and ankle.  This is normal after surgery.  Elevate the leg when you are not up walking on it.   °· Continue to use the breathing machine which will help keep your temperature down.  It is common for your temperature to cycle up and down following surgery, especially at night when you are not up moving around and exerting yourself.  The breathing machine keeps your lungs expanded and your temperature down. °· Do not place pillow under knee, focus on keeping the knee straight while resting ° °DIET °You may resume your previous home diet once your are discharged from the hospital. ° °DRESSING / WOUND CARE / SHOWERING °You may shower 3 days after surgery, but keep the wounds dry during showering.  You may use an occlusive plastic wrap (Press'n Seal for example), NO SOAKING/SUBMERGING IN THE BATHTUB.  If the  bandage gets wet, change with a clean dry gauze.  If the incision gets wet, pat the wound dry with a clean towel. °You may start showering once you are discharged home but do not submerge the incision under water. Just pat the incision dry and apply a dry gauze dressing on daily. °Change the surgical dressing daily and reapply a dry dressing each time. ° °ACTIVITY °Walk with your walker as instructed. °Use walker as long as suggested by your caregivers. °Avoid periods of inactivity such as sitting longer than an hour when not asleep. This helps prevent blood clots.  °You may resume a sexual relationship in one month or when given the OK by your doctor.  °You may return to work once you are cleared by your doctor.  °Do not drive a car for 6 weeks or until released by you surgeon.  °Do not drive while taking narcotics. ° °WEIGHT BEARING °Weight bearing as tolerated with assist device (walker, cane, etc) as directed, use it as long as suggested by your surgeon or therapist, typically at least 4-6 weeks. ° °POSTOPERATIVE CONSTIPATION PROTOCOL °Constipation - defined medically as fewer than three stools per week and severe constipation as less than one stool per week. ° °One of the most common issues patients have following surgery is constipation.  Even if you have a regular bowel pattern at home, your normal regimen is likely to be disrupted due to multiple reasons following surgery.  Combination of anesthesia, postoperative narcotics, change in appetite and fluid intake all can affect your bowels.    In order to avoid complications following surgery, here are some recommendations in order to help you during your recovery period. ° °Colace (docusate) - Pick up an over-the-counter form of Colace or another stool softener and take twice a day as long as you are requiring postoperative pain medications.  Take with a full glass of water daily.  If you experience loose stools or diarrhea, hold the colace until you stool forms  back up.  If your symptoms do not get better within 1 week or if they get worse, check with your doctor. ° °Dulcolax (bisacodyl) - Pick up over-the-counter and take as directed by the product packaging as needed to assist with the movement of your bowels.  Take with a full glass of water.  Use this product as needed if not relieved by Colace only.  ° °MiraLax (polyethylene glycol) - Pick up over-the-counter to have on hand.  MiraLax is a solution that will increase the amount of water in your bowels to assist with bowel movements.  Take as directed and can mix with a glass of water, juice, soda, coffee, or tea.  Take if you go more than two days without a movement. °Do not use MiraLax more than once per day. Call your doctor if you are still constipated or irregular after using this medication for 7 days in a row. ° °If you continue to have problems with postoperative constipation, please contact the office for further assistance and recommendations.  If you experience "the worst abdominal pain ever" or develop nausea or vomiting, please contact the office immediatly for further recommendations for treatment. ° °ITCHING ° If you experience itching with your medications, try taking only a single pain pill, or even half a pain pill at a time.  You can also use Benadryl over the counter for itching or also to help with sleep.  ° °TED HOSE STOCKINGS °Wear the elastic stockings on both legs for three weeks following surgery during the day but you may remove then at night for sleeping. ° °MEDICATIONS °See your medication summary on the “After Visit Summary” that the nursing staff will review with you prior to discharge.  You may have some home medications which will be placed on hold until you complete the course of blood thinner medication.  It is important for you to complete the blood thinner medication as prescribed by your surgeon.  Continue your approved medications as instructed at time of  discharge. ° °PRECAUTIONS °If you experience chest pain or shortness of breath - call 911 immediately for transfer to the hospital emergency department.  °If you develop a fever greater that 101 F, purulent drainage from wound, increased redness or drainage from wound, foul odor from the wound/dressing, or calf pain - CONTACT YOUR SURGEON.   °                                                °FOLLOW-UP APPOINTMENTS °Make sure you keep all of your appointments after your operation with your surgeon and caregivers. You should call the office at the above phone number and make an appointment for approximately two weeks after the date of your surgery or on the date instructed by your surgeon outlined in the "After Visit Summary". ° ° °RANGE OF MOTION AND STRENGTHENING EXERCISES  °Rehabilitation of the knee is important following a knee injury or   an operation. After just a few days of immobilization, the muscles of the thigh which control the knee become weakened and shrink (atrophy). Knee exercises are designed to build up the tone and strength of the thigh muscles and to improve knee motion. Often times heat used for twenty to thirty minutes before working out will loosen up your tissues and help with improving the range of motion but do not use heat for the first two weeks following surgery. These exercises can be done on a training (exercise) mat, on the floor, on a table or on a bed. Use what ever works the best and is most comfortable for you Knee exercises include:  Leg Lifts - While your knee is still immobilized in a splint or cast, you can do straight leg raises. Lift the leg to 60 degrees, hold for 3 sec, and slowly lower the leg. Repeat 10-20 times 2-3 times daily. Perform this exercise against resistance later as your knee gets better.  Quad and Hamstring Sets - Tighten up the muscle on the front of the thigh (Quad) and hold for 5-10 sec. Repeat this 10-20 times hourly. Hamstring sets are done by pushing the  foot backward against an object and holding for 5-10 sec. Repeat as with quad sets.   Leg Slides: Lying on your back, slowly slide your foot toward your buttocks, bending your knee up off the floor (only go as far as is comfortable). Then slowly slide your foot back down until your leg is flat on the floor again.  Angel Wings: Lying on your back spread your legs to the side as far apart as you can without causing discomfort.  A rehabilitation program following serious knee injuries can speed recovery and prevent re-injury in the future due to weakened muscles. Contact your doctor or a physical therapist for more information on knee rehabilitation.   IF YOU ARE TRANSFERRED TO A SKILLED REHAB FACILITY If the patient is transferred to a skilled rehab facility following release from the hospital, a list of the current medications will be sent to the facility for the patient to continue.  When discharged from the skilled rehab facility, please have the facility set up the patient's [place] prior to being released. Also, the skilled facility will be responsible for providing the patient with their medications at time of release from the facility to include their pain medication, the muscle relaxants, and their blood thinner medication. If the patient is still at the rehab facility at time of the two week follow up appointment, the skilled rehab facility will also need to assist the patient in arranging follow up appointment in our office and any transportation needs.  MAKE SURE YOU:  Understand these instructions.  Get help right away if you are not doing well or get worse.    Pick up stool softner and laxative for home use following surgery while on pain medications. Do not submerge incision under water. Please use good hand washing techniques while changing dressing each day. May shower starting three days after surgery. Please use a clean towel to pat the incision dry following  showers. Continue to use ice for pain and swelling after surgery. Do not use any lotions or creams on the incision until instructed by your surgeon.  Take Xarelto for two and a half more weeks following discharge from the hospital, then discontinue Xarelto. Once the patient has completed the Xarelto, they may resume the 81 mg Aspirin.   Information on my  medicine - XARELTO (Rivaroxaban)  This medication education was reviewed with me or my healthcare representative as part of my discharge preparation.  The pharmacist that spoke with me during my hospital stay was:  Kara Mead, [facility]  Why was Xarelto prescribed for you? Xarelto was prescribed for you to reduce the risk of a blood clot forming that can cause a stroke if you have a medical condition called atrial fibrillation (a type of irregular heartbeat).  What do you need to know about xarelto ? Take your Xarelto ONCE DAILY at the same time every day with your evening meal. If you have difficulty swallowing the tablet whole, you may crush it and mix in applesauce just prior to taking your dose.  Take Xarelto exactly as prescribed by your doctor and DO NOT stop taking Xarelto without talking to the doctor who prescribed the medication.  Stopping without other stroke prevention medication to take the place of Xarelto may increase your risk of developing a clot that causes a stroke.  Refill your prescription before you run out.  After discharge, you should have regular check-up appointments with your healthcare provider that is prescribing your Xarelto.  In the future your dose may need to be changed if your kidney function or weight changes by a significant amount.  What do you do if you miss a dose? If you are taking Xarelto ONCE DAILY and you miss a dose, take it as soon as you remember on the same day then continue your regularly scheduled once daily regimen the next day. Do not take two doses of Xarelto at the same  time or on the same day.   Important Safety Information A possible side effect of Xarelto is bleeding. You should call your healthcare provider right away if you experience any of the following: ? Bleeding from an injury or your nose that does not stop. ? Unusual colored urine (red or dark brown) or unusual colored stools (red or black). ? Unusual bruising for unknown reasons. ? A serious fall or if you hit your head (even if there is no bleeding).  Some medicines may interact with Xarelto and might increase your risk of bleeding while on Xarelto. To help avoid this, consult your healthcare provider or pharmacist prior to using any new prescription or non-prescription medications, including herbals, vitamins, non-steroidal anti-inflammatory drugs (NSAIDs) and supplements.  This website has more information on Xarelto: [email or web address]

## 2017-01-25 NOTE — Anesthesia Procedure Notes (Signed)
Procedure Name: MAC Date/Time: 01/25/2017 11:23 AM Performed by: West Pugh Pre-anesthesia Checklist: Patient identified, Emergency Drugs available, Suction available, Patient being monitored and Timeout performed Patient Re-evaluated:Patient Re-evaluated prior to induction Oxygen Delivery Method: Nasal cannula Placement Confirmation: CO2 detector,  positive ETCO2 and breath sounds checked- equal and bilateral Dental Injury: Teeth and Oropharynx as per pre-operative assessment

## 2017-01-25 NOTE — Progress Notes (Signed)
Gabapentin 300 mg P.O. Given as ordered.

## 2017-01-25 NOTE — Anesthesia Preprocedure Evaluation (Addendum)
Anesthesia Evaluation  Patient identified by MRN, date of birth, ID band Patient awake    Reviewed: Allergy & Precautions, NPO status , Patient's Chart, lab work & pertinent test results  Airway Mallampati: II  TM Distance: >3 FB Neck ROM: Full    Dental  (+) Missing Multiple extractions this week:   Pulmonary COPD,    Pulmonary exam normal breath sounds clear to auscultation       Cardiovascular hypertension, + Peripheral Vascular Disease and + DOE  Normal cardiovascular exam+ Valvular Problems/Murmurs AS  Rhythm:Regular Rate:Normal  Mild AS, nl EF   Neuro/Psych negative neurological ROS  negative psych ROS   GI/Hepatic Neg liver ROS, GERD  ,  Endo/Other  diabetes  Renal/GU negative Renal ROS  negative genitourinary   Musculoskeletal negative musculoskeletal ROS (+)   Abdominal   Peds negative pediatric ROS (+)  Hematology negative hematology ROS (+)   Anesthesia Other Findings   Reproductive/Obstetrics negative OB ROS                            Anesthesia Physical Anesthesia Plan  ASA: III  Anesthesia Plan: Spinal   Post-op Pain Management:    Induction: Intravenous  PONV Risk Score and Plan: 2 and Ondansetron and Dexamethasone  Airway Management Planned: Simple Face Mask  Additional Equipment:   Intra-op Plan:   Post-operative Plan:   Informed Consent: I have reviewed the patients History and Physical, chart, labs and discussed the procedure including the risks, benefits and alternatives for the proposed anesthesia with the patient or authorized representative who has indicated his/her understanding and acceptance.   Dental advisory given  Plan Discussed with: CRNA and Surgeon  Anesthesia Plan Comments: (PTSD, approach from the feet Do not tie arms down )       Anesthesia Quick Evaluation

## 2017-01-25 NOTE — Transfer of Care (Signed)
Immediate Anesthesia Transfer of Care Note  Patient: [name]  Procedure(s) Performed: Procedure(s) with comments: LEFT TOTAL KNEE ARTHROPLASTY (Left) - Adductor Block  Patient Location: PACU  Anesthesia Type:Spinal and MAC combined with regional for post-op pain  Level of Consciousness:  sedated, patient cooperative and responds to stimulation  Airway & Oxygen Therapy:Patient Spontanous Breathing and Patient connected to face mask oxgen  Post-op Assessment:  Report given to PACU RN and Post -op Vital signs reviewed and stable  Post vital signs:  Reviewed and stable  Last Vitals:  Vitals:   01/25/17 1111 01/25/17 1112  BP:    Pulse: 65   Resp: (!) 9 12  Temp:    SpO2: 448%     Complications: No apparent anesthesia complications

## 2017-01-25 NOTE — Progress Notes (Signed)
No wheezing noted after nebulizer treatment completed.

## 2017-01-25 NOTE — Anesthesia Procedure Notes (Addendum)
Anesthesia Regional Block: Adductor canal block   Pre-Anesthetic Checklist: ,, timeout performed, Correct Patient, Correct Site, Correct Laterality, Correct Procedure, Correct Position, site marked, Risks and benefits discussed,  Surgical consent,  Pre-op evaluation,  At surgeon's request and post-op pain management  Laterality: Left  Prep: chloraprep       Needles:  Injection technique: Single-shot  Needle Type: Echogenic Needle     Needle Length: 9cm      Additional Needles:   Procedures: ultrasound guided,,,,,,,,  Narrative:  Start time: 01/25/2017 11:10 AM End time: 01/25/2017 11:17 AM Injection made incrementally with aspirations every 5 mL.  Performed by: Personally  Anesthesiologist: Davell Beckstead  Additional Notes: Patient tolerated the procedure well without complications

## 2017-01-25 NOTE — Anesthesia Procedure Notes (Signed)
Anesthesia Procedure Image    

## 2017-01-25 NOTE — Anesthesia Procedure Notes (Addendum)
Spinal  Patient location during procedure: OR Start time: 01/25/2017 11:20 AM End time: 01/25/2017 11:26 AM Staffing Anesthesiologist: Genessa Beman, Iona Beard Performed: anesthesiologist  Preanesthetic Checklist Completed: patient identified, site marked, surgical consent, pre-op evaluation, timeout performed, IV checked, risks and benefits discussed and monitors and equipment checked Spinal Block Patient position: sitting Prep: Betadine Patient monitoring: heart rate, continuous pulse ox and blood pressure Location: L3-4 Injection technique: single-shot Needle Needle type: Sprotte  Needle gauge: 24 G Needle length: 9 cm Additional Notes Expiration date of kit checked and confirmed. Patient tolerated procedure well, without complications.

## 2017-01-25 NOTE — Anesthesia Postprocedure Evaluation (Signed)
Anesthesia Post Note  Patient: NEHEMYAH FOUSHEE  Procedure(s) Performed: Procedure(s) (LRB): LEFT TOTAL KNEE ARTHROPLASTY (Left)     Patient location during evaluation: PACU Anesthesia Type: Spinal Level of consciousness: oriented and awake and alert Pain management: pain level controlled Vital Signs Assessment: post-procedure vital signs reviewed and stable Respiratory status: spontaneous breathing, respiratory function stable and patient connected to nasal cannula oxygen Cardiovascular status: blood pressure returned to baseline and stable Postop Assessment: no headache and no backache Anesthetic complications: no    Last Vitals:  Vitals:   01/25/17 1345 01/25/17 1400  BP: (!) 144/68 (!) 117/58  Pulse: (!) 51 (!) 51  Resp: 14 14  Temp:  36.6 C  SpO2: 97% 97%    Last Pain:  Vitals:   01/25/17 1330  TempSrc:   PainSc: 0-No pain                 Zaccai Chavarin S
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
## 2017-01-26 ENCOUNTER — Encounter (HOSPITAL_COMMUNITY): Payer: Self-pay

## 2017-01-26 LAB — BASIC METABOLIC PANEL
ANION GAP: 9 (ref 5–15)
BUN: 13 mg/dL (ref 6–20)
CALCIUM: 8.9 mg/dL (ref 8.9–10.3)
CHLORIDE: 101 mmol/L (ref 101–111)
CO2: 23 mmol/L (ref 22–32)
Creatinine, Ser: 0.79 mg/dL (ref 0.61–1.24)
GFR calc Af Amer: >60 mL/min (ref >60)
GLUCOSE: 133 mg/dL — AB (ref 65–99)
POTASSIUM: 4.4 mmol/L (ref 3.5–5.1)
Sodium: 133 mmol/L — ABNORMAL LOW (ref 135–145)

## 2017-01-26 LAB — GLUCOSE, CAPILLARY
GLUCOSE-CAPILLARY: 158 mg/dL — AB (ref 65–99)
GLUCOSE-CAPILLARY: 141 mg/dL — AB (ref 65–99)
Glucose-Capillary: 131 mg/dL — ABNORMAL HIGH (ref 65–99)
Glucose-Capillary: 147 mg/dL — ABNORMAL HIGH (ref 65–99)

## 2017-01-26 LAB — CBC
HEMATOCRIT: 34.8 % — AB (ref 39.0–52.0)
HEMOGLOBIN: 12.1 g/dL — AB (ref 13.0–17.0)
MCH: 30 pg (ref 26.0–34.0)
MCHC: 34.8 g/dL (ref 30.0–36.0)
MCV: 86.1 fL (ref 78.0–100.0)
PLATELETS: 142 10*3/uL — AB (ref 150–400)
RBC: 4.04 MIL/uL — AB (ref 4.22–5.81)
RDW: 13.2 % (ref 11.5–15.5)
WBC: 13.2 10*3/uL — AB (ref 4.0–10.5)

## 2017-01-26 LAB — HEMOGLOBIN A1C
HEMOGLOBIN A1C: 5.2 % (ref 4.8–5.6)
Mean Plasma Glucose: 102.54 mg/dL

## 2017-01-26 MED ORDER — MORPHINE SULFATE 30 MG PO TABS: 30.0000 mg | ORAL_TABLET | ORAL | 0 refills | Status: DC | PRN

## 2017-01-26 MED ORDER — CYCLOBENZAPRINE HCL 5 MG PO TABS: 5.0000 mg | ORAL_TABLET | Freq: Three times a day (TID) | ORAL | 0 refills | Status: DC | PRN

## 2017-01-26 MED ORDER — RIVAROXABAN 10 MG PO TABS: 10.0000 mg | ORAL_TABLET | Freq: Every day | ORAL | 0 refills | Status: DC

## 2017-01-26 MED ORDER — MORPHINE SULFATE 15 MG PO TABS
30.0000 mg | ORAL_TABLET | ORAL | Status: DC | PRN
  Administered 2017-01-26 (×2): 45 mg via ORAL
  Administered 2017-01-27 – 2017-01-29 (×9): 60 mg via ORAL
  Administered 2017-02-02 – 2017-02-07 (×5): 30 mg via ORAL
  Administered 2017-02-08 – 2017-02-09 (×2): 60 mg via ORAL
  Filled 2017-01-26 (×2): qty 4
  Filled 2017-01-26: qty 3
  Filled 2017-01-26 (×4): qty 4
  Filled 2017-01-26: qty 3
  Filled 2017-01-26 (×3): qty 4
  Filled 2017-01-26 (×4): qty 2
  Filled 2017-01-26: qty 4
  Filled 2017-01-26: qty 2
  Filled 2017-01-26: qty 4

## 2017-01-26 MED ORDER — TRAMADOL HCL 50 MG PO TABS: 50.0000 mg | ORAL_TABLET | Freq: Four times a day (QID) | ORAL | 0 refills | Status: DC | PRN

## 2017-01-26 MED ORDER — MORPHINE SULFATE 30 MG PO TABS: 40.0000 mg | ORAL_TABLET | ORAL | Status: DC | PRN

## 2017-01-26 MED ORDER — MORPHINE SULFATE 15 MG PO TABS
40.0000 mg | ORAL_TABLET | ORAL | Status: DC | PRN
  Administered 2017-01-26: 37.5 mg via ORAL
  Filled 2017-01-26: qty 3

## 2017-01-26 MED ORDER — ALBUTEROL SULFATE (2.5 MG/3ML) 0.083% IN NEBU
2.5000 mg | INHALATION_SOLUTION | Freq: Two times a day (BID) | RESPIRATORY_TRACT | Status: DC
  Administered 2017-01-27 – 2017-02-09 (×21): 2.5 mg via RESPIRATORY_TRACT
  Filled 2017-01-26 (×26): qty 3
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
## 2017-01-26 NOTE — Plan of Care (Signed)
Problem: Pain Managment: Goal: General experience of comfort will improve Outcome: Not Progressing Medication readjusted

## 2017-01-26 NOTE — Evaluation (Addendum)
Occupational Therapy Evaluation Patient Details Name: [name] MRN: [id number] DOB: 04-Nov-1950 Today's Date: 01/26/2017    History of Present Illness s/p L TKA   Clinical Impression   This 66 y/o M presents with the above. Pt lives with spouse, and at baseline is mod independent with ADLs and functional mobility using SPC. Pt limited by pain this session, able to sit EOB and take small steps at RW along EOB with MinA prior to return to sitting. Pt currently requires MaxA for LB ADLs. Will continue to follow acutely to progress Pt's safety and independence with ADLs and functional mobility prior to return home.     Follow Up Recommendations  DC plan and follow up therapy as arranged by surgeon;Supervision/Assistance - 24 hour    Equipment Recommendations  None recommended by OT           Precautions / Restrictions Precautions Precautions: Fall;Knee Required Braces or Orthoses: Knee Immobilizer - Left Knee Immobilizer - Left: Discontinue once straight leg raise with < 10 degree lag Restrictions Weight Bearing Restrictions: No Other Position/Activity Restrictions: WBAT      Mobility Bed Mobility Overal bed mobility: Needs Assistance Bed Mobility: Supine to Sit;Sit to Supine     Supine to sit: Min assist Sit to supine: Min assist   General bed mobility comments: assist for LLE management   Transfers Overall transfer level: Needs assistance Equipment used: Rolling walker (2 wheeled) Transfers: Sit to/from Stand Sit to Stand: Min assist;From elevated surface         General transfer comment: cues for LLE position and hand placement                                               ADL either performed or assessed with clinical judgement   ADL Overall ADL's : Needs assistance/impaired Eating/Feeding: Sitting   Grooming: Set up;Sitting   Upper Body Bathing: Min guard;Sitting   Lower Body Bathing: Moderate assistance;Sit to/from stand    Upper Body Dressing : Min guard;Sitting   Lower Body Dressing: Maximal assistance;Sit to/from stand   Toilet Transfer: Moderate assistance;Stand-pivot;BSC;RW   Toileting- Clothing Manipulation and Hygiene: Maximal assistance;Sit to/from Nurse, children's Details (indicate cue type and reason): discussed option of completing sponge bath initially after return home until Pt is able to safely transfer into tub  Functional mobility during ADLs: Minimal assistance;Rolling walker General ADL Comments: Pt only able to complete sit<>stand at RW and approx 2 steps along EOB prior to return to sitting due to increased pain in LLE                          Pertinent Vitals/Pain Pain Assessment: 0-10 Pain Score: 10-Worst pain ever Pain Location: left knee Pain Descriptors / Indicators: Constant;Sore Pain Intervention(s): Monitored during session;Limited activity within patient's tolerance;Premedicated before session;Ice applied;Repositioned          Extremity/Trunk Assessment Upper Extremity Assessment Upper Extremity Assessment: Overall WFL for tasks assessed   Lower Extremity Assessment Lower Extremity Assessment: Defer to PT evaluation LLE: Unable to fully assess due to pain       Communication Communication Communication: No difficulties   Cognition Arousal/Alertness: Awake/alert Behavior During Therapy: WFL for tasks assessed/performed   Area of Impairment: Attention;Memory;Following commands;Problem solving  Current Attention Level: Sustained Memory: Decreased short-term memory Following Commands: Follows one step commands consistently     Problem Solving: Slow processing;Requires verbal cues General Comments: Pt easily distracted and requires cues to remain focused on task at [place] expects to be discharged to:: Private residence Living Arrangements: Spouse/significant  other Available Help at Discharge: Family Type of Home: House Home Access: [place]: One level     Bathroom Shower/Tub: Teacher, early years/pre: Handicapped height     Home Equipment: Environmental consultant - 4 wheels;Other (comment);Shower seat;Grab bars - tub/shower;Grab bars - toilet;Cane - single point;Toilet riser          Prior Functioning/Environment Level of Independence: Independent;Independent with assistive device(s)        Comments: amb with cane        OT Problem List: Decreased strength;Decreased activity tolerance;Pain;Decreased knowledge of use of DME or AE;Decreased knowledge of precautions      OT Treatment/Interventions: Self-care/ADL training;DME and/or AE instruction;Therapeutic activities;Balance training;Therapeutic exercise;Energy conservation;Patient/family education    OT Goals(Current goals can be found in the care plan section) Acute Rehab OT Goals Patient Stated Goal: less knee pain  OT Goal Formulation: With patient Time For Goal Achievement: 02/09/17 Potential to Achieve Goals: Good ADL Goals Pt Will Perform Grooming: with modified independence;sitting;standing Pt Will Perform Upper Body Bathing: with modified independence;sitting Pt Will Perform Lower Body Bathing: with supervision;sit to/from stand Pt Will Perform Lower Body Dressing: with min guard assist;with adaptive equipment;sit to/from stand (AE PRN ) Pt Will Transfer to Toilet: with min guard assist;stand pivot transfer;bedside commode Pt Will Perform Toileting - Clothing Manipulation and hygiene: with min guard assist;sit to/from stand  OT Frequency: Min 2X/week                             AM-PAC PT "6 Clicks" Daily Activity     Outcome Measure Help from another person eating meals?: None Help from another person taking care of personal grooming?: A Little Help from another person toileting, which includes using toliet, bedpan, or urinal?: A  Lot Help from another person bathing (including washing, rinsing, drying)?: A Lot Help from another person to put on and taking off regular upper body clothing?: A Little Help from another person to put on and taking off regular lower body clothing?: A Lot 6 Click Score: 16   End of Session Equipment Utilized During Treatment: Gait belt;Rolling walker Nurse Communication: Mobility status  Activity Tolerance: Patient limited by pain Patient left: in bed;with call bell/phone within reach  OT Visit Diagnosis: Unsteadiness on feet (R26.81);Muscle weakness (generalized) (M62.81);Pain Pain - Right/Left: Left Pain - part of body: Knee                Time: 5784-6962 OT Time Calculation (min): 22 min Charges:  OT General Charges $OT Visit: 1 Visit OT Evaluation $OT Eval Low Complexity: 1 Low G-Codes:     [name], OT Pager [phone] 01/26/2017   [name] 01/26/2017, 2:52 PM

## 2017-01-26 NOTE — Progress Notes (Signed)
OT Cancellation Note  Patient Details Name: [name] MRN: [id number] DOB: [birth year]   Cancelled Treatment:    Reason Eval/Treat Not Completed: Other (comment); Pt reporting significant pain, politely requesting OT return at a later time. Will check back as schedule permits.   Lou Cal, OT Pager [phone] 01/26/2017   [name] 01/26/2017, 11:41 AM

## 2017-01-26 NOTE — Progress Notes (Addendum)
   01/26/17 1400  PT Visit Information  Last PT Received On 01/26/17--If pt does not progress tomorrow, may need SNF/ pain remains an obstacle  Assistance Needed +1  History of Present Illness s/p L TKA  Subjective Data  Patient Stated Goal less knee pain   Precautions  Precautions Fall;Knee  Required Braces or Orthoses Knee Immobilizer - Left  Knee Immobilizer - Left Discontinue once straight leg raise with < 10 degree lag  Restrictions  Other Position/Activity Restrictions WBAT  Pain Assessment  Pain Assessment 0-10  Pain Score 10  Pain Location left knee  Pain Descriptors / Indicators Constant;Sore  Pain Intervention(s) Monitored during session;Limited activity within patient's tolerance;Premedicated before session;Ice applied  Cognition  Arousal/Alertness Awake/alert  Behavior During Therapy Restless  Current Attention Level Sustained  Following Commands Follows one step commands consistently  General Comments pt with multiple excuses and reasons as to why we should not to any therapy--pain, lunch "coming", bathroom issues, etc  Bed Mobility  General bed mobility comments refused OOB  Total Joint Exercises  Ankle Circles/Pumps AROM;Both;10 reps  Quad Sets AROM;Strengthening;Left;10 reps  Heel Slides AAROM;Left;10 reps  Hip ABduction/ADduction AAROM;Left;10 reps  Straight Leg Raises AAROM;Left;10 reps  Goniometric ROM grossly 10* to 40* AAROM knee flexion;   PT - End of Session  Equipment Utilized During Treatment Gait belt;Left knee immobilizer  Activity Tolerance Patient limited by pain  Patient left in bed;with call bell/phone within reach;with bed alarm set  PT - Assessment/Plan  PT Plan Current plan remains appropriate  PT Visit Diagnosis Difficulty in walking, not elsewhere classified (R26.2);Pain  Pain - Right/Left Left  Pain - part of body Knee  PT Frequency (ACUTE ONLY) 7X/week  Follow Up Recommendations DC plan and follow up therapy as arranged by surgeon  PT  equipment Rolling walker with 5" wheels  AM-PAC PT "6 Clicks" Daily Activity Outcome Measure  Difficulty turning over in bed (including adjusting bedclothes, sheets and blankets)? 1  Difficulty moving from lying on back to sitting on the side of the bed?  1  Difficulty sitting down on and standing up from a chair with arms (e.g., wheelchair, bedside commode, etc,.)? 1  Help needed moving to and from a bed to chair (including a wheelchair)? 3  Help needed walking in hospital room? 3  Help needed climbing 3-5 steps with a railing?  2  6 Click Score 11  Mobility G Code  CL  PT Goal Progression  Progress towards PT goals Not progressing toward goals - comment (pain)  Acute Rehab PT Goals  PT Goal Formulation With patient  Time For Goal Achievement 01/29/17  Potential to Achieve Goals Good  PT Time Calculation  PT Start Time (ACUTE ONLY) 6962  PT Stop Time (ACUTE ONLY) 1011  PT Time Calculation (min) (ACUTE ONLY) 19 min  PT General Charges  $$ ACUTE PT VISIT 1 Visit  PT Treatments  $Therapeutic Exercise 8-22 mins

## 2017-01-26 NOTE — Progress Notes (Signed)
Discharge planning, spoke with patient at bedside. Have chosen [facility] for [facility] PT. Contacted [facility] for referral. Needs RW, declines 3n1, contacted [facility] to deliver RW to room. [phone]

## 2017-01-26 NOTE — Evaluation (Signed)
Physical Therapy Evaluation Patient Details Name: [name] MRN: [id number] DOB: 10/11/1950 Today's Date: 01/26/2017   History of Present Illness  s/p L TKA  Clinical Impression  Pt is s/p TKA resulting in the deficits listed below (see PT Problem List). * Pt will benefit from skilled PT to increase their independence and safety with mobility to allow discharge to the venue listed below.  Pt limited by pain this am, able to amb 10' with RW and min assist (pain 10/10 despite meds); pt requires incr time and frequent redirection to task; will continue PT in acute setting '    Follow Up Recommendations DC plan and follow up therapy as arranged by surgeon    Equipment Recommendations  Rolling walker with 5" wheels    Recommendations for Other Services       Precautions / Restrictions Precautions Precautions: Fall;Knee Required Braces or Orthoses: Knee Immobilizer - Left Knee Immobilizer - Left: Discontinue once straight leg raise with < 10 degree lag Restrictions Weight Bearing Restrictions: No Other Position/Activity Restrictions: WBAT      Mobility  Bed Mobility Overal bed mobility: Needs Assistance Bed Mobility: Supine to Sit     Supine to sit: Min assist     General bed mobility comments: assist with LLE, cues to self assist, incr time  Transfers Overall transfer level: Needs assistance Equipment used: Rolling walker (2 wheeled) Transfers: Sit to/from Stand Sit to Stand: Min assist;From elevated surface         General transfer comment: cues for LLE position and hand placement  Ambulation/Gait Ambulation/Gait assistance: Min assist Ambulation Distance (Feet): 10 Feet Assistive device: Rolling walker (2 wheeled) Gait Pattern/deviations: Step-to pattern;Antalgic Gait velocity: decr   General Gait Details: cues for sequence, RW position, breathing  Stairs            Wheelchair Mobility    Modified Rankin (Stroke Patients Only)        Balance                                             Pertinent Vitals/Pain Pain Assessment: 0-10 Pain Score: 9  Pain Location: left knee Pain Descriptors / Indicators: Discomfort;Sore Pain Intervention(s): Limited activity within patient's tolerance;Monitored during session;Premedicated before session;Repositioned;Ice applied    Home Living Family/patient expects to be discharged to:: Private residence Living Arrangements: Spouse/significant other Available Help at Discharge: Family Type of Home: House Home Access: [place]: [place]: Environmental consultant - 4 wheels;Other (comment);Shower seat;Grab bars - tub/shower;Grab bars - toilet;Cane - single point (3 wheeled walker)      Prior Function Level of Independence: Independent;Independent with assistive device(s)         Comments: amb with cane     Hand Dominance        Extremity/Trunk Assessment   Upper Extremity Assessment Upper Extremity Assessment: Overall WFL for tasks assessed;Defer to OT evaluation    Lower Extremity Assessment Lower Extremity Assessment: LLE deficits/detail LLE: Unable to fully assess due to pain       Communication   Communication: No difficulties  Cognition Arousal/Alertness: Awake/alert Behavior During Therapy: WFL for tasks assessed/performed   Area of Impairment: Attention;Memory;Following commands;Problem solving                   Current Attention Level: Sustained Memory: Decreased short-term memory Following Commands:  Follows multi-step commands inconsistently;Follows one step commands with increased time     Problem Solving: Slow processing;Requires verbal cues General Comments: pt reports word finding difficulty at times, pt requires frequent redirection to task d/t internal distractions/tangential thoughts      General Comments      Exercises Total Joint Exercises Ankle Circles/Pumps: AROM;Both;10 reps Quad  Sets: AROM;Both;5 reps   Assessment/Plan    PT Assessment Patient needs continued PT services  PT Problem List Decreased strength;Decreased range of motion;Decreased activity tolerance;Decreased mobility;Decreased coordination;Decreased balance;Decreased knowledge of precautions;Pain       PT Treatment Interventions DME instruction;Gait training;Functional mobility training;Therapeutic activities;Therapeutic exercise;Patient/family education    PT Goals (Current goals can be found in the Care Plan section)  Acute Rehab PT Goals Patient Stated Goal: less knee pain  PT Goal Formulation: With patient Time For Goal Achievement: 01/29/17 Potential to Achieve Goals: Good    Frequency 7X/week   Barriers to discharge        Co-evaluation               AM-PAC PT "6 Clicks" Daily Activity  Outcome Measure Difficulty turning over in bed (including adjusting bedclothes, sheets and blankets)?: Unable Difficulty moving from lying on back to sitting on the side of the bed? : Unable Difficulty sitting down on and standing up from a chair with arms (e.g., wheelchair, bedside commode, etc,.)?: Unable Help needed moving to and from a bed to chair (including a wheelchair)?: A Little Help needed walking in hospital room?: A Little Help needed climbing 3-5 steps with a railing? : A Lot 6 Click Score: 11    End of Session Equipment Utilized During Treatment: Gait belt;Left knee immobilizer Activity Tolerance: Patient limited by pain     PT Visit Diagnosis: Difficulty in walking, not elsewhere classified (R26.2);Pain Pain - Right/Left: Left Pain - part of body: Knee    Time: 0921-0951 PT Time Calculation (min) (ACUTE ONLY): 30 min   Charges:   PT Evaluation $PT Eval Low Complexity: 1 Low PT Treatments $Gait Training: 8-22 mins   PT G CodesKenyon Ana, PT Pager: [phone] 01/26/2017   [facility] 01/26/2017, 11:05 AM

## 2017-01-27 ENCOUNTER — Encounter (HOSPITAL_COMMUNITY): Payer: Self-pay

## 2017-01-27 LAB — BASIC METABOLIC PANEL
ANION GAP: 9 (ref 5–15)
BUN: 14 mg/dL (ref 6–20)
CALCIUM: 9.3 mg/dL (ref 8.9–10.3)
CO2: 26 mmol/L (ref 22–32)
CREATININE: 0.85 mg/dL (ref 0.61–1.24)
Chloride: 99 mmol/L — ABNORMAL LOW (ref 101–111)
Glucose, Bld: 138 mg/dL — ABNORMAL HIGH (ref 65–99)
Potassium: 3.5 mmol/L (ref 3.5–5.1)
Sodium: 134 mmol/L — ABNORMAL LOW (ref 135–145)

## 2017-01-27 LAB — CBC
HEMATOCRIT: 34.7 % — AB (ref 39.0–52.0)
Hemoglobin: 11.7 g/dL — ABNORMAL LOW (ref 13.0–17.0)
MCH: 29.2 pg (ref 26.0–34.0)
MCHC: 33.7 g/dL (ref 30.0–36.0)
MCV: 86.5 fL (ref 78.0–100.0)
PLATELETS: 273 10*3/uL (ref 150–400)
RBC: 4.01 MIL/uL — ABNORMAL LOW (ref 4.22–5.81)
RDW: 13.1 % (ref 11.5–15.5)
WBC: 13.7 10*3/uL — AB (ref 4.0–10.5)

## 2017-01-27 LAB — GLUCOSE, CAPILLARY
GLUCOSE-CAPILLARY: 127 mg/dL — AB (ref 65–99)
Glucose-Capillary: 122 mg/dL — ABNORMAL HIGH (ref 65–99)
Glucose-Capillary: 105 mg/dL — ABNORMAL HIGH (ref 65–99)
Glucose-Capillary: 101 mg/dL — ABNORMAL HIGH (ref 65–99)

## 2017-01-27 NOTE — Progress Notes (Signed)
Physical Therapy Treatment Patient Details Name: [name] MRN: [id number] DOB: 02-26-1951 Today's Date: 01/27/2017    History of Present Illness s/p L TKA    PT Comments    Pt continues to progress slowly; he reports he will not be able to make a decision about d/c until "4 or 5" today; pt is requiring supervision to min/guard with activity and able to amb 30' with RW; limited by pain; will see again in  pm but  I do not  Think pt will be agreeable to D/C today d/t his pain issues; he does not have any stairs at home and his wife is available--- so he does not have any  barriers related to mobility (and D/C) from PT standpoint;  Follow Up Recommendations  DC plan and follow up therapy as arranged by surgeon     Equipment Recommendations  Rolling walker with 5" wheels    Recommendations for Other Services       Precautions / Restrictions Precautions Precautions: Fall;Knee Required Braces or Orthoses: Knee Immobilizer - Left Knee Immobilizer - Left: Discontinue once straight leg raise with < 10 degree lag Restrictions Weight Bearing Restrictions: No Other Position/Activity Restrictions: WBAT    Mobility  Bed Mobility   Bed Mobility: Supine to Sit;Sit to Supine     Supine to sit: Min assist Sit to supine: Supervision;HOB elevated   General bed mobility comments: incr time, cues to self assist and avoid useof trapeze and bed rails  Transfers Overall transfer level: Needs assistance Equipment used: Rolling walker (2 wheeled) Transfers: Sit to/from Stand Sit to Stand: Min guard;From elevated surface         General transfer comment: cues for LLE position and hand placement  Ambulation/Gait Ambulation/Gait assistance: Min guard Ambulation Distance (Feet): 30 Feet Assistive device: Rolling walker (2 wheeled) Gait Pattern/deviations: Step-to pattern;Antalgic Gait velocity: decr Gait velocity interpretation: Below normal speed for age/gender General Gait  Details: cues for sequence, RW position, breathing, incr time needed, one standing rest d/t pain   Stairs            Wheelchair Mobility    Modified Rankin (Stroke Patients Only)       Balance                                            Cognition Arousal/Alertness: Awake/alert Behavior During Therapy: WFL for tasks assessed/performed                                   General Comments: Pt easily distracted and requires frequent redirection to remain  focused  on current task      Exercises Total Joint Exercises Ankle Circles/Pumps: AROM;Both;10 reps Quad Sets: AROM;Strengthening;Left;10 reps Heel Slides: AAROM;Left;10 reps;Limitations Heel Slides Limitations: ROM limited to ~35* d/t c/o severe pain    General Comments        Pertinent Vitals/Pain Pain Assessment: Faces Faces Pain Scale: Hurts whole lot Pain Location: left knee Pain Descriptors / Indicators: Constant;Sore Pain Intervention(s): Monitored during session;Premedicated before session;Ice applied    Home Living                      Prior Function            PT Goals (current goals can now be found  in the care plan section) Acute Rehab PT Goals Patient Stated Goal: less knee pain  PT Goal Formulation: With patient Time For Goal Achievement: 01/29/17 Potential to Achieve Goals: Good Progress towards PT goals: Progressing toward goals    Frequency    7X/week      PT Plan Current plan remains appropriate    Co-evaluation PT/OT/SLP Co-Evaluation/Treatment: Yes Reason for Co-Treatment: For patient/therapist safety (pt request) PT goals addressed during session: Mobility/safety with mobility;Proper use of DME;Strengthening/ROM        AM-PAC PT "6 Clicks" Daily Activity  Outcome Measure  Difficulty turning over in bed (including adjusting bedclothes, sheets and blankets)?: A Little Difficulty moving from lying on back to sitting on the side of  the bed? : A Little Difficulty sitting down on and standing up from a chair with arms (e.g., wheelchair, bedside commode, etc,.)?: A Little Help needed moving to and from a bed to chair (including a wheelchair)?: A Little Help needed walking in hospital room?: A Little Help needed climbing 3-5 steps with a railing? : A Lot 6 Click Score: 17    End of Session Equipment Utilized During Treatment: Gait belt;Left knee immobilizer Activity Tolerance: Patient limited by pain Patient left: in bed;with call bell/phone within reach;with bed alarm set   PT Visit Diagnosis: Difficulty in walking, not elsewhere classified (R26.2);Pain Pain - Right/Left: Left Pain - part of body: Knee     Time: 6222-9798 PT Time Calculation (min) (ACUTE ONLY): 24 min  Charges:  $Gait Training: 8-22 mins                    G CodesKenyon Ana, PT Pager: [phone] 01/27/2017    Kenyon Ana 01/27/2017, 11:13 AM
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
## 2017-01-27 NOTE — Progress Notes (Addendum)
Occupational Therapy Treatment Patient Details Name: [name] MRN: [id number] DOB: August 06, 1950 Today's Date: 01/27/2017    History of present illness s/p L TKA   OT comments  Pt making slow progress towards goals, demonstrating increased mobility at RW with MinGuard assist this session. Continues to demonstrate some limitations mostly due to pain and increased nausea with mobility. Pt completed seated grooming ADLs at EOB with setup and minguard assist during static sitting. Will continue to follow acutely to progress Pt's safety and independence with ADLs and functional mobility.    Follow Up Recommendations  DC plan and follow up therapy as arranged by surgeon;Supervision/Assistance - 24 hour    Equipment Recommendations  None recommended by OT          Precautions / Restrictions Precautions Precautions: Fall;Knee Required Braces or Orthoses: Knee Immobilizer - Left Knee Immobilizer - Left: Discontinue once straight leg raise with < 10 degree lag Restrictions Weight Bearing Restrictions: No Other Position/Activity Restrictions: WBAT       Mobility Bed Mobility Overal bed mobility: Needs Assistance Bed Mobility: Supine to Sit;Sit to Supine     Supine to sit: Min assist Sit to supine: Supervision;HOB elevated   General bed mobility comments: incr time, cues to self assist and avoid useof trapeze and bed rails  Transfers Overall transfer level: Needs assistance Equipment used: Rolling walker (2 wheeled) Transfers: Sit to/from Stand Sit to Stand: Min guard;From elevated surface         General transfer comment: cues for LLE position and hand placement                                               ADL either performed or assessed with clinical judgement   ADL Overall ADL's : Needs assistance/impaired Eating/Feeding: Sitting;Set up   Grooming: Set up;Sitting;Wash/dry face               Lower Body Dressing: Total assistance;Bed  level Lower Body Dressing Details (indicate cue type and reason): total assist to don socks at bed level              Functional mobility during ADLs: Min guard;Rolling walker General ADL Comments: Pt completed room and hallway level functional mobility with MinGuard assist. Completed seated grooming ADLs with setup. Continues to have increased pain/nausea with mobility.                        Cognition Arousal/Alertness: Awake/alert Behavior During Therapy: WFL for tasks assessed/performed                                   General Comments: Pt easily distracted and requires frequent redirection to remain  focused  on current task                         Pertinent Vitals/ Pain       Pain Assessment: Faces Faces Pain Scale: Hurts whole lot Pain Location: left knee Pain Descriptors / Indicators: Constant;Sore Pain Intervention(s): Monitored during session;Premedicated before session;Ice applied  Frequency  Min 2X/week        Progress Toward Goals  OT Goals(current goals can now be found in the care plan section)  Progress towards OT goals: Progressing toward goals  Acute Rehab OT Goals Patient Stated Goal: less knee pain  OT Goal Formulation: With patient Time For Goal Achievement: 02/09/17 Potential to Achieve Goals: Good  Plan Discharge plan remains appropriate    Co-evaluation    PT/OT/SLP Co-Evaluation/Treatment: Yes Reason for Co-Treatment: For patient/therapist safety (Pt request ) PT goals addressed during session: Mobility/safety with mobility;Proper use of DME;Strengthening/ROM OT goals addressed during session: ADL's and self-care;Proper use of Adaptive equipment and DME      AM-PAC PT "6 Clicks" Daily Activity     Outcome Measure   Help from another person eating meals?: None Help from another person taking care of personal grooming?:  A Little   Help from another person bathing (including washing, rinsing, drying)?: A Lot Help from another person to put on and taking off regular upper body clothing?: A Little Help from another person to put on and taking off regular lower body clothing?: A Lot 6 Click Score: 14    End of Session Equipment Utilized During Treatment: Gait belt;Rolling walker  OT Visit Diagnosis: Unsteadiness on feet (R26.81);Muscle weakness (generalized) (M62.81);Pain Pain - Right/Left: Left Pain - part of body: Knee   Activity Tolerance Patient tolerated treatment well;Patient limited by pain   Patient Left in bed;with call bell/phone within reach;with bed alarm set   Nurse Communication Mobility status        Time: 6468-0321 OT Time Calculation (min): 24 min  Charges: OT General Charges $OT Visit: 1 Visit OT Treatments $Self Care/Home Management : 8-22 mins  [name], OT Pager [phone] 01/27/2017    [name] 01/27/2017, 12:43 PM

## 2017-01-27 NOTE — Progress Notes (Signed)
   01/27/17 1400  PT Visit Information  Last PT Received On 01/27/17--exercise focused session, pt continues to attempt to decline PT for multiple reasons, mainly stating he has just had pain meds--pt retracted this when confronted regarding timing of Morphine/Xanax/Flexeril and was then agreeable to exercises; pt continues to be concerned about D/C today--defer further discussion to RN, pt may need to stay until next day although his issues with pain will likely not change over night  Assistance Needed +1  History of Present Illness s/p L TKA  Subjective Data  Patient Stated Goal less knee pain   Precautions  Precautions Fall;Knee  Required Braces or Orthoses Knee Immobilizer - Left  Knee Immobilizer - Left Discontinue once straight leg raise with < 10 degree lag  Restrictions  Weight Bearing Restrictions No  Other Position/Activity Restrictions WBAT  Pain Assessment  Pain Assessment Faces  Faces Pain Scale 8  Pain Location left knee  Pain Descriptors / Indicators Constant;Sore  Pain Intervention(s) Limited activity within patient's tolerance;Monitored during session;Premedicated before session;Ice applied  Cognition  Arousal/Alertness Awake/alert  Behavior During Therapy WFL for tasks assessed/performed  General Comments pt continues to require cues to focus on task, tangential thoughts deter progress  Total Joint Exercises  Ankle Circles/Pumps AROM;Both;10 reps  Quad Sets AROM;Strengthening;Left;10 reps  Heel Slides AAROM;Left;10 reps;Limitations  Hip ABduction/ADduction AAROM;Left;10 reps  Straight Leg Raises AAROM;Left;10 reps  Goniometric ROM grossly 40* flexion--pt using sheet loop to self assist  Heel Slides Limitations pain limits flexion  PT - End of Session  Activity Tolerance Patient limited by pain  Patient left in bed;with call bell/phone within reach;with bed alarm set  PT - Assessment/Plan  PT Plan Current plan remains appropriate  PT Visit Diagnosis Difficulty in  walking, not elsewhere classified (R26.2);Pain  Pain - Right/Left Left  Pain - part of body Knee  PT Frequency (ACUTE ONLY) 7X/week  Follow Up Recommendations DC plan and follow up therapy as arranged by surgeon  PT equipment Rolling walker with 5" wheels  AM-PAC PT "6 Clicks" Daily Activity Outcome Measure  Difficulty turning over in bed (including adjusting bedclothes, sheets and blankets)? 3  Difficulty moving from lying on back to sitting on the side of the bed?  3  Difficulty sitting down on and standing up from a chair with arms (e.g., wheelchair, bedside commode, etc,.)? 3  Help needed moving to and from a bed to chair (including a wheelchair)? 3  Help needed walking in hospital room? 3  Help needed climbing 3-5 steps with a railing?  2  6 Click Score 17  Mobility G Code  CK  PT Goal Progression  Progress towards PT goals Progressing toward goals  Acute Rehab PT Goals  PT Goal Formulation With patient  Time For Goal Achievement 01/29/17  Potential to Achieve Goals Good  PT Time Calculation  PT Start Time (ACUTE ONLY) 1348  PT Stop Time (ACUTE ONLY) 1417  PT Time Calculation (min) (ACUTE ONLY) 29 min  PT General Charges  $$ ACUTE PT VISIT 1 Visit  PT Treatments  $Gait Training 23-37 mins

## 2017-01-28 LAB — CBC
HCT: 36.6 % — ABNORMAL LOW (ref 39.0–52.0)
Hemoglobin: 12.5 g/dL — ABNORMAL LOW (ref 13.0–17.0)
MCH: 29.8 pg (ref 26.0–34.0)
MCHC: 34.2 g/dL (ref 30.0–36.0)
MCV: 87.4 fL (ref 78.0–100.0)
PLATELETS: 318 10*3/uL (ref 150–400)
RBC: 4.19 MIL/uL — ABNORMAL LOW (ref 4.22–5.81)
RDW: 13.2 % (ref 11.5–15.5)
WBC: 12.7 10*3/uL — ABNORMAL HIGH (ref 4.0–10.5)

## 2017-01-28 MED ORDER — BISACODYL 10 MG RE SUPP
10.0000 mg | Freq: Once | RECTAL | Status: AC
  Administered 2017-01-28: 10 mg via RECTAL
  Filled 2017-01-28: qty 1

## 2017-01-28 MED ORDER — SODIUM CHLORIDE 0.9 % IV BOLUS (SEPSIS)
250.0000 mL | Freq: Once | INTRAVENOUS | Status: AC
  Administered 2017-01-28: 250 mL via INTRAVENOUS

## 2017-01-28 NOTE — Progress Notes (Signed)
Occupational Therapy Treatment Patient Details Name: [name] MRN: [id number] DOB: 05-28-1951 Today's Date: 01/28/2017    History of present illness s/p L TKA   OT comments  Pt ambulated to bathroom, sat on commode and stood for several minutes to urinate. Lightheaded on way back to bed.  BP 99/57 supine. RN aware  Follow Up Recommendations  DC plan and follow up therapy as arranged by surgeon;Supervision/Assistance - 24 hour    Equipment Recommendations  None recommended by OT    Recommendations for Other Services      Precautions / Restrictions Precautions Precautions: Fall;Knee Required Braces or Orthoses: Knee Immobilizer - Left Knee Immobilizer - Left: Discontinue once straight leg raise with < 10 degree lag Restrictions Weight Bearing Restrictions: No Other Position/Activity Restrictions: WBAT       Mobility Bed Mobility         Supine to sit: Min assist     General bed mobility comments: assist for LLE  Transfers   Equipment used: Rolling walker (2 wheeled)   Sit to Stand: Min guard;From elevated surface         General transfer comment: cues for LLE position and hand placement    Balance                                           ADL either performed or assessed with clinical judgement   ADL                           Toilet Transfer: Min guard;Ambulation;BSC;RW   Toileting- Clothing Manipulation and Hygiene: Sit to/from stand         General ADL Comments: ambulated to bathroom and sat on 3:1.  Stood to use urinal. Wife will assist with adls.  Pt will sponge bathe initially as he has a tub which he can put a seat inside (not a bench)     Vision       Perception     Praxis      Cognition Arousal/Alertness: Awake/alert Behavior During Therapy: WFL for tasks assessed/performed Overall Cognitive Status: Within Functional Limits for tasks assessed                                           Exercises     Shoulder Instructions       General Comments Pt felt lightheaded walking back to bed from bathroom.  Supine BP 99/57.  Informed RN    Pertinent Vitals/ Pain       Pain Score: 8  Pain Location: left knee Pain Descriptors / Indicators: Constant;Sore Pain Intervention(s): Limited activity within patient's tolerance;Monitored during session;Repositioned;Patient requesting pain meds-RN notified  Home Living                                          Prior Functioning/Environment              Frequency           Progress Toward Goals  OT Goals(current goals can now be found in the care plan section)  Progress towards OT goals: Progressing toward goals (slowly)  Acute Rehab  OT Goals Time For Goal Achievement: 02/09/17  Plan      Co-evaluation                 AM-PAC PT "6 Clicks" Daily Activity     Outcome Measure   Help from another person eating meals?: None Help from another person taking care of personal grooming?: A Little Help from another person toileting, which includes using toliet, bedpan, or urinal?: A Lot Help from another person bathing (including washing, rinsing, drying)?: A Lot Help from another person to put on and taking off regular upper body clothing?: A Little Help from another person to put on and taking off regular lower body clothing?: A Lot 6 Click Score: 16    End of Session    OT Visit Diagnosis: Unsteadiness on feet (R26.81);Muscle weakness (generalized) (M62.81);Pain Pain - Right/Left: Left Pain - part of body: Knee   Activity Tolerance Patient tolerated treatment well;Patient limited by pain   Patient Left     Nurse Communication          Time: 5284-1324 OT Time Calculation (min): 23 min  Charges: OT General Charges $OT Visit: 1 Visit OT Treatments $Self Care/Home Management : 8-22 mins  Lesle Chris, OTR/L [phone] 01/28/2017   Willy Vorce 01/28/2017, 10:00  AM

## 2017-01-28 NOTE — Progress Notes (Signed)
Pt SOB while walking to bathroom. Also became dizzy so BP was checked and BP was 94/56.

## 2017-01-28 NOTE — Care Management Important Message (Signed)
Important Message  Patient Details  Name: AADHAV UHLIG MRN: [id number] Date of Birth: [birth year]   Medicare Important Message Given:  Yes    Kerin Salen 01/28/2017, 9:51 AMImportant Message  Patient Details  Name: CARLISS QUAST MRN: [id number] Date of Birth: 26-Oct-1950   Medicare Important Message Given:  Yes    Kerin Salen 01/28/2017, 9:51 AM

## 2017-01-28 NOTE — Progress Notes (Signed)
Second IV(started by PACU) infiltrated after 189ml of NS. PACU reattempting another IV. Oral fluids are being pushed while we are waiting on another access. Orthostatics are as follows: Lying 119/58 Sitting: 80/45.  Drew PA notified. Will continue to monitor.

## 2017-01-28 NOTE — Progress Notes (Signed)
PT Cancellation Note  Patient Details Name: [name] MRN: [id number] DOB: [birth year]   Cancelled Treatment:    Reason Eval/Treat Not Completed: Medical issues which prohibited therapy, low BP   Claretha Cooper 01/28/2017, 1:46 PM Tresa Endo PT [phone]

## 2017-01-28 NOTE — Progress Notes (Signed)
Physical Therapy Treatment Patient Details Name: [name] MRN: [id number] DOB: August 01, 1950 Today's Date: 01/28/2017    History of Present Illness s/p L TKA    PT Comments    Pt limited by pain and decr BP despite getting fluids and pain meds earlier; OOB deferred d/t BP 63/44 and RN notified; will continue efforts to see pt later today if pt able   Follow Up Recommendations  DC plan and follow up therapy as arranged by surgeon     Equipment Recommendations  Rolling walker with 5" wheels    Recommendations for Other Services       Precautions / Restrictions Precautions Precautions: Fall;Knee Required Braces or Orthoses: Knee Immobilizer - Left Knee Immobilizer - Left: Discontinue once straight leg raise with < 10 degree lag Restrictions Other Position/Activity Restrictions: WBAT    Mobility  Bed Mobility         Supine to sit: Min assist     General bed mobility comments: assist for LLE  Transfers   Equipment used: Rolling walker (2 wheeled)   Sit to Stand: Min guard;From elevated surface         General transfer comment: cues for LLE position and hand placement  Ambulation/Gait                 Stairs            Wheelchair Mobility    Modified Rankin (Stroke Patients Only)       Balance                                            Cognition Arousal/Alertness: Awake/alert Behavior During Therapy: WFL for tasks assessed/performed Overall Cognitive Status: Within Functional Limits for tasks assessed                                        Exercises Total Joint Exercises Ankle Circles/Pumps: AROM;Both;10 reps Quad Sets: AROM;Strengthening;Left;5 reps Heel Slides: AAROM;Left;Limitations;5 reps Hip ABduction/ADduction: AAROM;Left;5 reps Straight Leg Raises: AAROM;Left;5 reps Goniometric ROM: 8*-35*    General Comments General comments (skin integrity, edema, etc.): Pt felt lightheaded  walking back to bed from bathroom.  Supine BP 99/57.  Informed RN      Pertinent Vitals/Pain Pain Assessment: 0-10 Pain Score: 8  Pain Location: left knee Pain Descriptors / Indicators: Constant;Sore Pain Intervention(s): Limited activity within patient's tolerance;Monitored during session;Premedicated before session;Ice applied    Home Living                      Prior Function            PT Goals (current goals can now be found in the care plan section) Acute Rehab PT Goals PT Goal Formulation: With patient Time For Goal Achievement: 01/29/17 Potential to Achieve Goals: Fair Progress towards PT goals: Not progressing toward goals - comment (pain and decr BP)    Frequency    7X/week      PT Plan Current plan remains appropriate    Co-evaluation              AM-PAC PT "6 Clicks" Daily Activity  Outcome Measure  Difficulty turning over in bed (including adjusting bedclothes, sheets and blankets)?: Unable Difficulty moving from lying on back to sitting on  the side of the bed? : Unable Difficulty sitting down on and standing up from a chair with arms (e.g., wheelchair, bedside commode, etc,.)?: Unable Help needed moving to and from a bed to chair (including a wheelchair)?: Total Help needed walking in hospital room?: A Lot Help needed climbing 3-5 steps with a railing? : Total 6 Click Score: 7    End of Session   Activity Tolerance: Patient tolerated treatment well Patient left: in bed;with call bell/phone within reach   PT Visit Diagnosis: Difficulty in walking, not elsewhere classified (R26.2);Pain Pain - Right/Left: Left Pain - part of body: Knee     Time: 4315-4008 PT Time Calculation (min) (ACUTE ONLY): 17 min  Charges:  $Therapeutic Exercise: 8-22 mins                    G CodesKenyon [name], PT Pager: [phone] 01/28/2017    [name] 01/28/2017, 11:36 AM
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
## 2017-01-29 ENCOUNTER — Inpatient Hospital Stay (HOSPITAL_COMMUNITY): Payer: Medicare Other

## 2017-01-29 DIAGNOSIS — R112 Nausea with vomiting, unspecified: Secondary | ICD-10-CM

## 2017-01-29 LAB — COMPREHENSIVE METABOLIC PANEL
ALBUMIN: 3.7 g/dL (ref 3.5–5.0)
ALK PHOS: 77 U/L (ref 38–126)
ALT: 26 U/L (ref 17–63)
ANION GAP: 15 (ref 5–15)
AST: 31 U/L (ref 15–41)
BILIRUBIN TOTAL: 2.6 mg/dL — AB (ref 0.3–1.2)
BUN: 42 mg/dL — ABNORMAL HIGH (ref 6–20)
CO2: 26 mmol/L (ref 22–32)
Calcium: 9.3 mg/dL (ref 8.9–10.3)
Chloride: 89 mmol/L — ABNORMAL LOW (ref 101–111)
Creatinine, Ser: 1.83 mg/dL — ABNORMAL HIGH (ref 0.61–1.24)
GFR, EST AFRICAN AMERICAN: 43 mL/min — AB (ref >60)
GFR, EST NON AFRICAN AMERICAN: 37 mL/min — AB (ref >60)
Glucose, Bld: 130 mg/dL — ABNORMAL HIGH (ref 65–99)
POTASSIUM: 3.9 mmol/L (ref 3.5–5.1)
Sodium: 130 mmol/L — ABNORMAL LOW (ref 135–145)
TOTAL PROTEIN: 8.3 g/dL — AB (ref 6.5–8.1)

## 2017-01-29 LAB — GLUCOSE, CAPILLARY
GLUCOSE-CAPILLARY: 128 mg/dL — AB (ref 65–99)
GLUCOSE-CAPILLARY: 141 mg/dL — AB (ref 65–99)
Glucose-Capillary: 162 mg/dL — ABNORMAL HIGH (ref 65–99)
Glucose-Capillary: 157 mg/dL — ABNORMAL HIGH (ref 65–99)

## 2017-01-29 MED ORDER — SODIUM CHLORIDE 0.9 % IV SOLN
INTRAVENOUS | Status: DC
  Administered 2017-01-29 – 2017-02-03 (×8): via INTRAVENOUS
  Administered 2017-02-04: 1000 mL via INTRAVENOUS

## 2017-01-29 MED ORDER — FLEET ENEMA 7-19 GM/118ML RE ENEM
1.0000 | ENEMA | Freq: Once | RECTAL | Status: DC
  Filled 2017-01-29: qty 1

## 2017-01-29 MED ORDER — LIDOCAINE HCL 2 % EX GEL
CUTANEOUS | Status: AC
  Filled 2017-01-29: qty 30

## 2017-01-29 MED ORDER — HYDRALAZINE HCL 20 MG/ML IJ SOLN
5.0000 mg | INTRAMUSCULAR | Status: DC | PRN
  Administered 2017-02-01 – 2017-02-03 (×8): 5 mg via INTRAVENOUS
  Filled 2017-01-29 (×6): qty 1
  Filled 2017-01-29 (×2): qty 0.25
  Filled 2017-01-29 (×4): qty 1

## 2017-01-29 MED ORDER — BUTAMBEN-TETRACAINE-BENZOCAINE 2-2-14 % EX AERO
INHALATION_SPRAY | CUTANEOUS | Status: AC
  Filled 2017-01-29: qty 20

## 2017-01-29 MED ORDER — IOPAMIDOL (ISOVUE-300) INJECTION 61%
INTRAVENOUS | Status: AC
  Filled 2017-01-29: qty 50

## 2017-01-29 MED ORDER — SODIUM CHLORIDE 0.9 % IV BOLUS (SEPSIS)
250.0000 mL | Freq: Once | INTRAVENOUS | Status: AC
  Administered 2017-01-29: 250 mL via INTRAVENOUS

## 2017-01-29 MED ORDER — CARVEDILOL 6.25 MG PO TABS: 6.2500 mg | ORAL_TABLET | Freq: Two times a day (BID) | ORAL | Status: DC

## 2017-01-29 MED ORDER — METOPROLOL TARTRATE 5 MG/5ML IV SOLN
2.5000 mg | Freq: Four times a day (QID) | INTRAVENOUS | Status: DC
  Administered 2017-01-29 – 2017-02-01 (×8): 2.5 mg via INTRAVENOUS
  Filled 2017-01-29 (×8): qty 5

## 2017-01-29 MED ORDER — PROCHLORPERAZINE EDISYLATE 5 MG/ML IJ SOLN
5.0000 mg | INTRAMUSCULAR | Status: DC | PRN
Start: 2017-01-29 — End: 2017-02-03
  Administered 2017-01-29 – 2017-01-31 (×6): 5 mg via INTRAVENOUS
  Filled 2017-01-29 (×6): qty 2
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
## 2017-01-29 NOTE — Progress Notes (Signed)
Patient sent to Radiology for NG tube insertion at 1120.

## 2017-01-29 NOTE — Progress Notes (Signed)
OT Cancellation Note  Patient Details Name: ANSH FAUBLE MRN: [id number] DOB: 09-10-1950   Cancelled Treatment:    Reason Eval/Treat Not Completed: Medical issues which prohibited therapy.  Held therapy today per RN:  N/V and possible ileus.  Travares Nelles 01/29/2017, 8:07 AM  Lesle Chris, OTR/L [phone] 01/29/2017
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
## 2017-01-29 NOTE — Consult Note (Signed)
[facility] Medical Consultation  [name] JOI:[id number] DOB: 1951-04-14 DOA: 01/25/2017 PCP: Remi Haggard, FNP   Requesting physician: Joelene Millin, PA-C; Dr. Wynelle Link Date of consultation: 01/29/2017 Reason for consultation: Nausea/vomiting/abdominal distention  HPI:  66 year old male with history of type 2 diabetes mellitus, hyperlipidemia, hypertension, COPD, CAD, who was admitted on orthopedic surgery service with left knee osteoarthritis and underwent left total knee arthroplasty on 01/25/2017.  Patient has been progressively nauseous and has been complaining of abdominal distention over the last 24 hours, this was associated with orthostatic hypotension requiring several IV fluid boluses.  He denies any chest pain or palpitations.  He denies any fevers or chills.  He does have a cough with minimal sputum production, and that is somewhat chronic.  He reports he has not had a bowel movement in several days, however he is passing gas.  He has a history of intermittent chest pains for which she was admitted on cardiology service in May 2018, underwent a cardiac catheterization which was negative for significant coronary artery disease.  He also has COPD followed by Dr. Annamaria Boots with pulmonology (patient never smoked).  He does have history of extensive intra-abdominal surgery (appendicectomy, Bilroth II), and has had an episode of SBO in 2015.  His surgeon is Dr. Harlow Asa.   Review of Systems:  As per HPI otherwise 10 point review of systems negative   Impression/Recommendations Principal Problem:   OA (osteoarthritis) of knee Active Problems:   Essential hypertension   Mild aortic stenosis by prior echocardiogram   H/O major abdominal surgery   COPD mixed type (HCC)    Abdominal pain, nausea vomiting -Concern for postop ileus/SBO.  Two-view abdominal x-ray is pending.  Obtain chest x-ray as well given reported cough.  May need an NG tube if patient has  persistent vomiting and/or x-ray shows ileus.  Will wait on the results prior to obtaining a surgery consult -Bowel rest, n.p.o., antiemetics, IV fluids  COPD -No wheezing, this appears stable, continue Brio elliptical, continue albuterol  Hypertension -Given orthostatic hypotension, concern for dehydration, discontinue ARB/diuretic combination.  Continue Coreg at a lower dose.  Continue fluids -Ordered hydralazine as needed  ?CAD with prior history of chest pain -recent cardiac catheterization 3 months ago negative for significant coronary artery disease. -Most recent 2D echo October 2017 showed normal EF 60-65%, mild aortic stenosis and mild regurgitation  I will followup again tomorrow. Please contact me if I can be of assistance in the meanwhile. Thank you for this consultation.   Past Medical History:  Diagnosis Date  . Anemia   . Asthma   . Back pain, chronic, followed at pain clinic 11/25/2011  . Carotid artery stenosis 09/06/2014  . Chest pain with low risk for cardiac etiology 10/2011   Non-obstructive CAD by Cath; negative Lexiscan in 08/2011  . Clotting disorder (HCC)    LLE DVT (also small vessel)  . Colon polyps   . COPD (chronic obstructive pulmonary disease) ([place])   . Dementia    Mild  . Depression   . Dyslipidemia (high LDL; low HDL)    statin intolerant; on fibrate  . Edema leg    Wears compression stockings; 08/2012 dopplers - no DVT or thrombophlebitis; mild R Popliteal V reflux - no VNUS ablation candidate  . Fainting   . Family history of acute myocardial infarction. and premature CAD 11/25/2011  . GERD (gastroesophageal reflux disease)   . Hearing loss   . History of diabetes mellitus   .  Hypertension    very labile  . Mild aortic stenosis by prior echocardiogram 03/2012   moderate sclerosis  . Nephrolithiasis   . Osteoarthritis   . Other idiopathic peripheral autonomic neuropathy    Agent orange  . PTSD (post-traumatic stress disorder) 07/24/2015    per patient approach from foot of bed to awake; do not apply any contricting pressure, also avoid approaching from behind with any loud noises   . Seizure ([place])   . Small bowel obstruction ([place]) 1990s, 2001, 2015   s/p multiple bowel surgeries; from war wounds  . Stroke ([place])   . Weakness    Past Surgical History:  Procedure Laterality Date  . APPENDECTOMY  1958   per patient  . CARDIOVASCULAR STRESS TEST  08/2011   Negative lexiscan myoview  . Carotid Dopplers  01/2016   Stable. RICA - slight progression from<40% to 40-59%.  Otherwise stable bilateral carotids and subclavian arteries. Stable LICA  . COLONOSCOPY    . DENTAL SURGERY  01/14/2017   5 extractions in preparation for LTKA on 01-25-17; also was started on amoxicillin x 7days; has since completed therapy   . exploratory laparotomy with extensive lysis of adhesions  10/1999  . JOINT REPLACEMENT     right knee replaced 2x  . LEFT HEART CATHETERIZATION WITH CORONARY ANGIOGRAM N/A 11/26/2011   WNL Lorretta Harp, MD  . RIGHT/LEFT HEART CATH AND CORONARY ANGIOGRAPHY N/A 10/22/2016   Procedure: Right/Left Heart Cath and Coronary Angiography;  Surgeon: Leonie Man, MD;  Location: [facility];  Service: Cardiovascular;  Laterality: N/A;  . small bowel obstruction  1996, 1999, 2001, 2015  . TONSILLECTOMY  1960   per patient  . TOTAL KNEE ARTHROPLASTY    . TOTAL KNEE ARTHROPLASTY Left 01/25/2017   Procedure: LEFT TOTAL KNEE ARTHROPLASTY;  Surgeon: Gaynelle Arabian, MD;  Location: [facility];  Service: Orthopedics;  Laterality: Left;  Adductor Block  . TRANSTHORACIC ECHOCARDIOGRAM  03/24/2016   Mild LVH. EF 60-65%. GR 1 DD. Severely restricted calcified noncoronary cusp of the aortic valve. Mild aortic stenosis (mean gradient 17 mmHg) with mild regurgitation.  . TRANSTHORACIC ECHOCARDIOGRAM  2012; 03/2012   a. Essentially normal wirth moderate concentric LVH and moderate aortic sclerosis; b. Mild concentric LVH. EF greater than 55%.  Mild aortic stenosis. Grade 1 diastolic dysfunction   Social History:  reports that he has never smoked. He has quit using smokeless tobacco. He reports that he drinks alcohol. He reports that he does not use drugs.  Allergies  Allergen Reactions  . Bee Venom Anaphylaxis  . Codeine Shortness Of Breath and Rash  . Doxycycline Shortness Of Breath and Rash  . Ivp Dye [Iodinated Diagnostic Agents] Shortness Of Breath    Lost consciousness/difficulty breathing Omni - Paque Contrast   . Levaquin [Levofloxacin] Hives and Shortness Of Breath  . Oxycodone Hcl Shortness Of Breath, Swelling and Rash  . Stadol [Butorphanol] Shortness Of Breath  . Succinylcholine Chloride Shortness Of Breath, Nausea Only, Rash and Other (See Comments)    Difficulty breathing   . Atorvastatin     myalgias  . Bystolic [Nebivolol Hcl] Other (See Comments)    Nightmares, flashbacks (PTSD)  . Iohexol      Desc: hives,neck and torso erythemia   . Methocarbamol Nausea Only    rash  . Pentazocine Lactate     rash  . Dilaudid [Hydromorphone Hcl] Nausea Only and Rash    Aggressive   . Synvisc [Hylan G-F 20] Hives and  Rash   Family History  Problem Relation Age of Onset  . Stroke Mother   . Heart disease Mother   . Heart attack Mother   . Heart disease Father   . Heart attack Father   . Heart attack Sister   . Heart attack Brother   . Hypertension Brother   . Heart attack Son   . Stroke Son   . Hypertension Brother   . Sleep apnea Son   . Non-Hodgkin's lymphoma Daughter     Prior to Admission medications   Medication Sig Start Date End Date Taking? Authorizing Provider  albuterol (PROVENTIL HFA) 108 (90 BASE) MCG/ACT inhaler Inhale 2 puffs into the lungs every 4 (four) hours as needed. For wheezing and/or shortness of breath 10/09/14  Yes Young, Tarri Fuller D, MD  albuterol (PROVENTIL) (2.5 MG/3ML) 0.083% nebulizer solution Take 3 mLs (2.5 mg total) by nebulization every 6 (six) hours as needed for wheezing  or shortness of breath. 01/10/15  Yes Young, Tarri Fuller D, MD  ALPRAZolam Duanne Moron) 1 MG tablet Take 1 tablet (1 mg total) by mouth 3 (three) times daily as needed for anxiety or sleep. Patient taking differently: Take 1-2 mg by mouth every 8 (eight) hours as needed (for anxiety/PTSD).  01/22/14  Yes Theodis Blaze, MD  aspirin 81 MG chewable tablet Chew 81 mg by mouth daily.   Yes [provider]  beclomethasone (BECONASE-AQ) 42 MCG/SPRAY nasal spray Place 1 spray into both nostrils daily. Dose is for each nostril.   Yes [provider]  benzonatate (TESSALON) 100 MG capsule Take 100 mg by mouth 3 (three) times daily as needed for cough (for wheezing/cough).   Yes [provider]  budesonide-formoterol (SYMBICORT) 160-4.5 MCG/ACT inhaler 2 puffs then rinse mouth well, twice daily 05/28/16  Yes Young, Clinton D, MD  carvedilol (COREG) 25 MG tablet TAKE 1 TABLET (25 MG TOTAL) BY MOUTH TWICE DAILY. 03/30/16  Yes Leonie Man, MD  Cholecalciferol (VITAMIN D) 2000 units tablet Take 12,000 Units by mouth 3 (three) times daily.   Yes [provider]  furosemide (LASIX) 20 MG tablet Take 1 tablet (20 mg total) by mouth daily. MAY TAKE ADDITIONAL TABLET AS NEEDED SWELLING OR SHORTNESS OF BREATH Patient taking differently: Take 20 mg by mouth daily as needed (for fluid retention (knee/feet/hands)). MAY TAKE ADDITIONAL TABLET AS NEEDED SWELLING OR SHORTNESS OF BREATH 03/30/16  Yes Leonie Man, MD  hydrALAZINE (APRESOLINE) 25 MG tablet Take 25 mg by mouth daily as needed (for high blood pressure at or above 150).   Yes [provider]  irbesartan-hydrochlorothiazide (AVALIDE) 300-12.5 MG tablet TAKE 1 TABLET BY MOUTH DAILY. 02/24/16  Yes Leonie Man, MD  magnesium hydroxide (MILK OF MAGNESIA) 400 MG/5ML suspension Take 15 mLs by mouth 3 (three) times daily as needed for mild constipation or indigestion.   Yes [provider]  morphine (MSIR) 30 MG tablet  Take 1 tablet (30 mg total) by mouth every 4 (four) hours as needed. FOR PAIN Patient taking differently: Take 30 mg by mouth every 4 (four) hours as needed for moderate pain.  01/22/14  Yes Theodis Blaze, MD  Multiple Vitamins-Minerals (CENTRUM SILVER PO) Take 1 capsule by mouth daily.   Yes [provider]  Naldemedine Tosylate (SYMPROIC) 0.2 MG TABS Take 0.2 mg by mouth daily at 12 noon.    Yes [provider]  nitroGLYCERIN (NITROSTAT) 0.4 MG SL tablet Place 1 tablet (0.4 mg total) under the tongue every  5 (five) minutes as needed for chest pain. 10/15/16  Yes Kilroy, Luke K, PA-C  polyethylene glycol (MIRALAX / GLYCOLAX) packet Take 17 g by mouth 2 (two) times daily as needed for moderate constipation.    Yes [provider]  promethazine (PHENERGAN) 25 MG tablet Take 1 tablet (25 mg total) by mouth every 6 (six) hours as needed. FOR NAUSEA Patient taking differently: Take 25 mg by mouth every 6 (six) hours as needed for nausea or vomiting. FOR NAUSEA 01/22/14  Yes Theodis Blaze, MD  Respiratory Therapy Supplies (FLUTTER) DEVI Use as directed 01/10/15  Yes Baird Lyons D, MD  vitamin E (VITAMIN E) 400 UNIT capsule Take 400 Units by mouth daily.   Yes [provider]  cyclobenzaprine (FLEXERIL) 5 MG tablet Take 1 tablet (5 mg total) by mouth 3 (three) times daily as needed (for muscle cramps). 01/26/17   Perkins, Alexzandrew L, PA-C  famotidine (PEPCID) 20 MG tablet Take 1 tablet (20 mg total) by mouth daily. 1 TAB (20MG ) @ HS ON 5-23 AND 5-24 AM BEFORE PROCEDURE Patient not taking: Reported on 01/18/2017 10/15/16   Erlene Quan, PA-C  isosorbide mononitrate (IMDUR) 30 MG 24 hr tablet Take 1 tablet (30 mg total) by mouth daily. TAKE IN THE MORNING WITH HYDRALAZINE Patient not taking: Reported on 01/18/2017 03/30/16 10/19/16  Leonie Man, MD  morphine (MSIR) 30 MG tablet Take 1-2 tablets (30-60 mg total) by mouth every 4 (four) hours as needed for moderate  pain. 01/26/17   Perkins, Alexzandrew L, PA-C  predniSONE (DELTASONE) 20 MG tablet Take 60 mg ( 3 tablets ) at 6 pm and bedtime day prior to procedure and the morning of procedure and the afternoon of procedure. Patient not taking: Reported on 01/08/2017 08/31/16   Ahmed Prima, Fransisco Hertz, PA-C  predniSONE (DELTASONE) 20 MG tablet Take 1 tablet (20 mg total) by mouth daily with breakfast. 6 (20MG )TAB AT 6PM ON 5-23 AND 6 (20MG ) TAB AT HS 5-23 & 6TAB(20MG )ON 2-24 AM Patient not taking: Reported on 01/08/2017 10/15/16   Erlene Quan, PA-C  rivaroxaban (XARELTO) 10 MG TABS tablet Take 1 tablet (10 mg total) by mouth daily with breakfast. Take Xarelto for two and a half more weeks following discharge from the hospital, then discontinue Xarelto. Once the patient has completed the Xarelto, they may resume the 81 mg Aspirin. 01/27/17   Perkins, Alexzandrew L, PA-C  sulfamethoxazole-trimethoprim (BACTRIM DS,SEPTRA DS) 800-160 MG tablet Take 1 tablet by mouth 2 (two) times daily. Patient not taking: Reported on 10/19/2016 10/02/16   Deneise Lever, MD  traMADol (ULTRAM) 50 MG tablet Take 1-2 tablets (50-100 mg total) by mouth every 6 (six) hours as needed for moderate pain. 01/26/17   Perkins, Alexzandrew L, PA-C   Physical Exam: Blood pressure 127/71, pulse 95, temperature 98.7 F (37.1 C), temperature source Oral, resp. rate 15, height 6\' 1"  (1.854 m), weight 104.8 kg (231 lb), SpO2 93 %. Vitals:   01/28/17 2130 01/29/17 0544  BP: 118/64 127/71  Pulse: 82 95  Resp: 16 15  Temp: 99.3 F (37.4 C) 98.7 F (37.1 C)  SpO2: 97% 93%     General: Appears uncomfortable, leaning forward with emesis basin in his hand  Eyes: No scleral icterus  ENT: Moist mucous membrane, poor dentition  Cardiovascular: Regular rate and rhythm, 2/6 SEM best heard at the base, trace lower extremity edema, good peripheral pulses  Respiratory: Overall decreased breath sounds, no wheezing, no crackles, clear to  auscultation  Abdomen: Appears distended, mild tenderness throughout, no guarding, no rebound, diminished bowel sounds  Skin: No rashes  Musculoskeletal: Slightly decreased muscle mass  Psychiatric: Normal mood  Neurologic: Cranial nerves grossly intact, strength 5 out of 5 and equal  Labs on Admission:  Basic Metabolic Panel:  Recent Labs Lab 01/22/17 1315 01/26/17 0532 01/27/17 0459  NA 137 133* 134*  K 4.0 4.4 3.5  CL 103 101 99*  CO2 26 23 26   GLUCOSE 119* 133* 138*  BUN 17 13 14   CREATININE 0.74 0.79 0.85  CALCIUM 9.5 8.9 9.3   Liver Function Tests:  Recent Labs Lab 01/22/17 1315  AST 20  ALT 16*  ALKPHOS 95  BILITOT 1.2  PROT 7.5  ALBUMIN 4.2   No results for input(s): LIPASE, AMYLASE in the last 168 hours. No results for input(s): AMMONIA in the last 168 hours. CBC:  Recent Labs Lab 01/22/17 1315 01/26/17 0532 01/27/17 0459 01/28/17 0541  WBC 5.5 13.2* 13.7* 12.7*  HGB 13.3 12.1* 11.7* 12.5*  HCT 39.3 34.8* 34.7* 36.6*  MCV 87.1 86.1 86.5 87.4  PLT 272 142* 273 318   Cardiac Enzymes: No results for input(s): CKTOTAL, CKMB, CKMBINDEX, TROPONINI in the last 168 hours. BNP: Invalid input(s): POCBNP CBG:  Recent Labs Lab 01/27/17 0717 01/27/17 1207 01/27/17 1813 01/27/17 2127 01/29/17 0812  GLUCAP 122* 127* 105* 101* 128*    Radiological Exams on Admission: No results found.  EKG: Independently reviewed. Pending   Marzetta Board [facility] Pager [phone]  If 7PM-7AM, please contact night-coverage [email or web address] Password [facility] 01/29/2017, 8:24 AM

## 2017-01-29 NOTE — Progress Notes (Signed)
PT Cancellation Note  Patient Details Name: [name] MRN: [id number] DOB: 05-Feb-1951   Cancelled Treatment:    Reason Eval/Treat Not Completed: Medical issues which prohibited therapy, N/V, r/o ileus.   [facility] [phone]  Claretha Cooper 01/29/2017, 8:20 AM

## 2017-01-29 NOTE — Progress Notes (Signed)
PA, Arlee Muslim called this morning about patient's wound appearance as blistering is present, on both the lateral and interior aspects of patients surgical incision.   PA confirmed, and gave verbal order for RN to place a tegaderm over the blisters to help protect/ stop them from popping.   RN placed tegaderm over blistering areas.

## 2017-01-30 ENCOUNTER — Inpatient Hospital Stay (HOSPITAL_COMMUNITY): Payer: Medicare Other

## 2017-01-30 DIAGNOSIS — Z8719 Personal history of other diseases of the digestive system: Secondary | ICD-10-CM

## 2017-01-30 DIAGNOSIS — J449 Chronic obstructive pulmonary disease, unspecified: Secondary | ICD-10-CM

## 2017-01-30 DIAGNOSIS — Z9889 Other specified postprocedural states: Secondary | ICD-10-CM

## 2017-01-30 HISTORY — PX: TRANSTHORACIC ECHOCARDIOGRAM: SHX275

## 2017-01-30 LAB — COMPREHENSIVE METABOLIC PANEL
ALT: 34 U/L (ref 17–63)
AST: 35 U/L (ref 15–41)
Albumin: 3.2 g/dL — ABNORMAL LOW (ref 3.5–5.0)
Alkaline Phosphatase: 79 U/L (ref 38–126)
Anion gap: 15 (ref 5–15)
BILIRUBIN TOTAL: 2.2 mg/dL — AB (ref 0.3–1.2)
BUN: 72 mg/dL — AB (ref 6–20)
CALCIUM: 8.5 mg/dL — AB (ref 8.9–10.3)
CHLORIDE: 94 mmol/L — AB (ref 101–111)
CO2: 22 mmol/L (ref 22–32)
CREATININE: 3.09 mg/dL — AB (ref 0.61–1.24)
GFR, EST AFRICAN AMERICAN: 23 mL/min — AB (ref >60)
GFR, EST NON AFRICAN AMERICAN: 20 mL/min — AB (ref >60)
Glucose, Bld: 144 mg/dL — ABNORMAL HIGH (ref 65–99)
Potassium: 4.8 mmol/L (ref 3.5–5.1)
Sodium: 131 mmol/L — ABNORMAL LOW (ref 135–145)
TOTAL PROTEIN: 7.4 g/dL (ref 6.5–8.1)

## 2017-01-30 MED ORDER — ENOXAPARIN SODIUM 30 MG/0.3ML ~~LOC~~ SOLN
30.0000 mg | SUBCUTANEOUS | Status: DC
  Administered 2017-01-30 – 2017-01-31 (×2): 30 mg via SUBCUTANEOUS
  Filled 2017-01-30 (×2): qty 0.3

## 2017-01-30 NOTE — Addendum Note (Signed)
Addendum  created 01/30/17 1423 by Lissa Morales, CRNA   Anesthesia Intra LDAs edited, LDA properties accepted

## 2017-01-30 NOTE — Progress Notes (Signed)
Subjective: 5 Days Post-Op Procedure(s) (LRB): LEFT TOTAL KNEE ARTHROPLASTY (Left)  Patient reports pain as mild to moderate.  Patient has NG tube, however he complains of gurgling in his throat.  The nurse reports that she cannot hear bowel sounds.  Plan is to have belly scanned to confirm appropriate NG tube placement.  Patient is NPO and on oral xarelto.  Nurse would like to have patient changed to lovenox.  Patient reports that he is happy with is knee.  Continues to c/o of abdominal distention and nausea.  Objective:   VITALS:  Temp:  [97.4 F (36.3 C)-98.5 F (36.9 C)] 97.4 F (36.3 C) (09/01 0524) Pulse Rate:  [75-94] 75 (09/01 0524) Resp:  [16-22] 19 (09/01 0524) BP: (91-134)/(48-68) 91/50 (09/01 0524) SpO2:  [85 %-94 %] 94 % (09/01 0524)  General: WDWN patient in NAD. Psych:  Appropriate mood and affect. Neuro:  A&O x 3, Moving all extremities, sensation intact to light touch HEENT:  EOMs intact Chest:  Even non-labored respirations.  Abdomen distended. Skin:  Dressing C/D/I, no rashes.  1.5 cm serus blister noted at distal medial aspect of incision site with tape covering it.  1 cm blister at superior aspect of incision site with tape covering. Extremities: warm/dry, mild edema, no erythema or echymosis.  No lymphadenopathy. Pulses: Popliteus 2+ MSK:  ROM: lacks 5 degrees of TKE, MMT: patient is able to perform quad set, (-) Homan's    LABS  Recent Labs  01/28/17 0541  HGB 12.5*  WBC 12.7*  PLT 318    Recent Labs  01/29/17 0741 01/30/17 0555  NA 130* 131*  K 3.9 4.8  CL 89* 94*  CO2 26 22  BUN 42* 72*  CREATININE 1.83* 3.09*  GLUCOSE 130* 144*   No results for input(s): LABPT, INR in the last 72 hours.   Assessment/Plan: 5 Days Post-Op Procedure(s) (LRB): LEFT TOTAL KNEE ARTHROPLASTY (Left)  Consult to pharmacy change xarelto to lovenox. WBAT L LE Up with therapy once medically stable. Plan is to D/C once medically stable.   Mechele Claude,  PA-C [facility] Office:  [phone]

## 2017-01-30 NOTE — Progress Notes (Signed)
Pharmacy - Brief Note  Pharmacy asked to evaluate appropriateness to change xarelto to enoxaparin for NPO status (for VTE prophylaxis s/p TKA), NGT in place.  Note Xarelto also needs to be avoided with AKI and CrCl now (slightly) < 30 ml/min  Plan  Based on CrCl < 76ml/min, start enoxaparin 30mg  SQ q24h  If CrCl improves to > 49ml/min, suggest increase to 30mg  SQ q12h  Doreene Eland, PharmD, BCPS.   Pager: [phone] 01/30/2017 8:05 AM

## 2017-01-30 NOTE — Progress Notes (Signed)
Checked for placement of NGT and could not hear it, so had Xray which confirmed it was not in the right place. Removed old NGT and inserted new. Auscultated it for placement which proved to be in the right place and then hooked up to Low intermittent suction.

## 2017-01-30 NOTE — Progress Notes (Signed)
Nurse paged hospitalist, Blount to make her aware of/ patients BP 91/50, pulse 75, Resp 19, Temp 97.4. No urine output. Per Blout, BP is okay. Insert foley catheter if bladder scan reveals 400+ urine. Bladder scan reveal over 428ml of urine. Nurse will insert foley catheter.

## 2017-01-30 NOTE — Progress Notes (Signed)
PROGRESS NOTE    [name]  JAS:[id number] DOB: [birth year] DOA: 01/25/2017 PCP: Remi Haggard, FNP    Brief Narrative:  66 year old male with history of type 2 diabetes mellitus, hyperlipidemia, hypertension, COPD, CAD, who was admitted on orthopedic surgery service with left knee osteoarthritis and underwent left total knee arthroplasty on 01/25/2017.  Patient has been progressively nauseous and has been complaining of abdominal distention over the last 24 hours, this was associated with orthostatic hypotension requiring several IV fluid boluses.  He denies any chest pain or palpitations.  He denies any fevers or chills.  He does have a cough with minimal sputum production, and that is somewhat chronic.  He reports he has not had a bowel movement in several days, however he is passing gas.  He has a history of intermittent chest pains for which she was admitted on cardiology service in May 2018, underwent a cardiac catheterization which was negative for significant coronary artery disease.  He also has COPD followed by Dr. Annamaria Boots with pulmonology (patient never smoked).  He does have history of extensive intra-abdominal surgery (appendicectomy, Bilroth II), and has had an episode of SBO in 2015.  His surgeon is Dr. Harlow Asa.   Assessment & Plan:   Principal Problem:   OA (osteoarthritis) of knee Active Problems:   Essential hypertension   Mild aortic stenosis by prior echocardiogram   H/O major abdominal surgery   COPD mixed type (HCC)  Abdominal pain, nausea vomiting secondary to post-op ileus -dilated loops of small bowel on abd xray -NG tube ordered, however tube not in place this AM. Have re-inserted NG to LWS -Continue with bowel rest, anti-emetics, hydration as tolerated -No evidence of obstruction noted on imaging, reviewed  COPD -No audible wheezing on exam. -continue Brio elliptical, continue albuterol  Hypertension -Given orthostatic hypotension, concern for  dehydration, discontinued ARB/diuretic combination.  Continue Coreg at a lower dose.  Continue fluids -Continue PRN hydralazine as needed  ?CAD with prior history of chest pain -recent cardiac catheterization 3 months ago negative for significant coronary artery disease. -Most recent 2D echo October 2017 showed normal EF 60-65%, mild aortic stenosis and mild regurgitation -Stable at present  Osteoarthritis -pt is s/p L knee arthoplasty -Per Orthopedic Surgery  ARF -Labs reviewed. Cr up to 3 this AM -Pt noted to have over 400cc urine in bladder, now s/p cath placement - Likely bladder outlet obstruction - continue foley cath for now. Hydrate. - Repeat bmet in AM  DVT prophylaxis: Lovenox subQ Code Status: Full Family Communication: Pt in room, family not in room Disposition Plan: Uncertain at this time  Consultants:     Procedures:     Antimicrobials: Anti-infectives    Start     Dose/Rate Route Frequency Ordered Stop   01/25/17 1800  ceFAZolin (ANCEF) IVPB 2g/100 mL premix     2 g 200 mL/hr over 30 Minutes Intravenous Every 6 hours 01/25/17 1510 01/25/17 2359   01/25/17 1003  ceFAZolin (ANCEF) 2-4 GM/100ML-% IVPB    Comments:  [name]   : cabinet override      01/25/17 1003 01/25/17 1130   01/25/17 0933  ceFAZolin (ANCEF) IVPB 2g/100 mL premix     2 g 200 mL/hr over 30 Minutes Intravenous On call to O.R. 01/25/17 0933 01/25/17 1200       Subjective: Complaining of continued abd discomfort  Objective: Vitals:   01/30/17 0041 01/30/17 0524 01/30/17 0916 01/30/17 1236  BP:  (!) 91/50  (!) 129/56  Pulse:  75  73  Resp:  19  17  Temp:  (!) 97.4 F (36.3 C)  97.8 F (36.6 C)  TempSrc:  Oral  Oral  SpO2: 93% 94% 92% 95%  Weight:      Height:        Intake/Output Summary (Last 24 hours) at 01/30/17 1506 Last data filed at 01/30/17 1236  Gross per 24 hour  Intake              925 ml  Output             2625 ml  Net            -1700 ml   Filed  Weights   01/25/17 0951  Weight: 104.8 kg (231 lb)    Examination:  General exam: Appears calm and comfortable  Respiratory system: Clear to auscultation. Respiratory effort normal. Cardiovascular system: S1 & S2 heard, RRR Gastrointestinal system: Abdomen is nondistended, soft and nontender. No organomegaly or masses felt. Normal bowel sounds heard. Central nervous system: Alert and oriented. No focal neurological deficits. Extremities: Symmetric 5 x 5 power. Skin: No rashes, lesions Psychiatry: Judgement and insight appear normal. Mood & affect appropriate.   Data Reviewed: I have personally reviewed following labs and imaging studies  CBC:  Recent Labs Lab 01/26/17 0532 01/27/17 0459 01/28/17 0541  WBC 13.2* 13.7* 12.7*  HGB 12.1* 11.7* 12.5*  HCT 34.8* 34.7* 36.6*  MCV 86.1 86.5 87.4  PLT 142* 273 371   Basic Metabolic Panel:  Recent Labs Lab 01/26/17 0532 01/27/17 0459 01/29/17 0741 01/30/17 0555  NA 133* 134* 130* 131*  K 4.4 3.5 3.9 4.8  CL 101 99* 89* 94*  CO2 23 26 26 22   GLUCOSE 133* 138* 130* 144*  BUN 13 14 42* 72*  CREATININE 0.79 0.85 1.83* 3.09*  CALCIUM 8.9 9.3 9.3 8.5*   GFR: Estimated Creatinine Clearance: 29.9 mL/min (A) (by C-G formula based on SCr of 3.09 mg/dL (H)). Liver Function Tests:  Recent Labs Lab 01/29/17 0741 01/30/17 0555  AST 31 35  ALT 26 34  ALKPHOS 77 79  BILITOT 2.6* 2.2*  PROT 8.3* 7.4  ALBUMIN 3.7 3.2*   No results for input(s): LIPASE, AMYLASE in the last 168 hours. No results for input(s): AMMONIA in the last 168 hours. Coagulation Profile: No results for input(s): INR, PROTIME in the last 168 hours. Cardiac Enzymes: No results for input(s): CKTOTAL, CKMB, CKMBINDEX, TROPONINI in the last 168 hours. BNP (last 3 results) No results for input(s): PROBNP in the last 8760 hours. HbA1C: No results for input(s): HGBA1C in the last 72 hours. CBG:  Recent Labs Lab 01/27/17 2127 01/29/17 0812 01/29/17 1211  01/29/17 1652 01/29/17 2120  GLUCAP 101* 128* 141* 162* 157*   Lipid Profile: No results for input(s): CHOL, HDL, LDLCALC, TRIG, CHOLHDL, LDLDIRECT in the last 72 hours. Thyroid Function Tests: No results for input(s): TSH, T4TOTAL, FREET4, T3FREE, THYROIDAB in the last 72 hours. Anemia Panel: No results for input(s): VITAMINB12, FOLATE, FERRITIN, TIBC, IRON, RETICCTPCT in the last 72 hours. Sepsis Labs: No results for input(s): PROCALCITON, LATICACIDVEN in the last 168 hours.  Recent Results (from the past 240 hour(s))  Surgical pcr screen     Status: Abnormal   Collection Time: 01/22/17 11:10 AM  Result Value Ref Range Status   MRSA, PCR NEGATIVE NEGATIVE Final   Staphylococcus aureus POSITIVE (A) NEGATIVE Final    Comment:        The Xpert  SA Assay (FDA approved for NASAL specimens in patients over 62 years of age), is one component of a comprehensive surveillance program.  Test performance has been validated by [facility] for patients greater than or equal to 49 year old. It is not intended to diagnose infection nor to guide or monitor treatment.   Surgical PCR screen     Status: None   Collection Time: 01/25/17 10:26 AM  Result Value Ref Range Status   MRSA, PCR NEGATIVE NEGATIVE Final   Staphylococcus aureus NEGATIVE NEGATIVE Final    Comment:        The Xpert SA Assay (FDA approved for NASAL specimens in patients over 88 years of age), is one component of a comprehensive surveillance program.  Test performance has been validated by [facility] for patients greater than or equal to 58 year old. It is not intended to diagnose infection nor to guide or monitor treatment. DELTA CHECK NOTED      Radiology Studies: Dg Chest Port 1 View  Result Date: 01/30/2017 CLINICAL DATA:  NG tube placement. EXAM: PORTABLE CHEST 1 VIEW COMPARISON:  One-view chest x-ray 01/29/2017. FINDINGS: The heart size is exaggerate by low lung volumes. Pulmonary vascular congestion has  increased. No focal airspace disease is present. There are no effusions. The tip of the NG tube is at the thoracic inlet. IMPRESSION: 1. The tip of the NG tube is at the thoracic inlet. 2. Increased pulmonary vascular congestion. Electronically Signed   By: San Morelle M.D.   On: 01/30/2017 08:39   Dg Chest Port 1 View  Result Date: 01/29/2017 CLINICAL DATA:  Nausea and vomiting. EXAM: PORTABLE CHEST 1 VIEW COMPARISON:  07/31/2016. FINDINGS: Mediastinum and hilar structures normal. No focal infiltrate. No pleural effusion or pneumothorax. IMPRESSION: No acute cardiopulmonary disease. Electronically Signed   By: Marcello Moores  Register   On: 01/29/2017 09:05   Dg Abd Portable 1v  Result Date: 01/30/2017 CLINICAL DATA:  NG tube verification. EXAM: PORTABLE ABDOMEN - 1 VIEW COMPARISON:  Abdominal radiographs 01/29/2017. FINDINGS: The NG tube is not visible in the lower chest or abdomen. Multiple dilated loops of small bowel are noted, worse on the left. Dilation measures up to 6.1 cm. IMPRESSION: 1. Dilated loops of small bowel without evidence for obstruction. Findings are most compatible with ileus. 2. NG tube is not visualized in the lower chest or abdomen. Electronically Signed   By: San Morelle M.D.   On: 01/30/2017 08:38   Dg Abd Portable 2v  Result Date: 01/29/2017 CLINICAL DATA:  Ileus. EXAM: PORTABLE ABDOMEN - 2 VIEW COMPARISON:  07/31/2016 FINDINGS: Dilated loops of bowel gas throughout the abdomen. There is evidence for gas in both the small and large bowel. No evidence for free air on the decubitus image. Surgical clips in the upper abdomen. IMPRESSION: Gas-filled loops of bowel throughout the abdomen. Findings are suggestive for an ileus. Electronically Signed   By: Markus Daft M.D.   On: 01/29/2017 08:55   Dg Addison Bailey G Tube Plc W/fl W/rad  Result Date: 01/29/2017 CLINICAL DATA:  Abdominal distention. EXAM: NASO G TUBE PLACEMENT WITH FL AND WITH RAD FLUOROSCOPY TIME:  Fluoroscopy  Time:  0 minutes 50 seconds Radiation Exposure Index (if provided by the fluoroscopic device): 17.3 mGy COMPARISON:  None. FINDINGS: I inserted a nasogastric tube through the right side of the nose and advanced the tube into the stomach without difficulty using fluoroscopic monitoring. 1900 cc of fluid was suctioned from the NG tube after placement. IMPRESSION:  NG tube placed in the stomach without difficulty. Mr. Deboer tolerated the procedure well. Electronically Signed   By: Lorriane Shire M.D.   On: 01/29/2017 12:31    Scheduled Meds: . albuterol  2.5 mg Nebulization BID  . docusate sodium  100 mg Oral BID  . enoxaparin (LOVENOX) injection  30 mg Subcutaneous Q24H  . fluticasone  1 spray Each Nare Daily  . fluticasone furoate-vilanterol  1 puff Inhalation Daily  . metoprolol tartrate  2.5 mg Intravenous Q6H  . Naldemedine Tosylate  0.2 mg Oral Q1200  . sodium phosphate  1 enema Rectal Once   Continuous Infusions: . sodium chloride 100 mL/hr at 01/30/17 1430     LOS: 5 days   CHIU, Orpah Melter, MD [facility] Pager [phone]  If 7PM-7AM, please contact night-coverage [email or web address] Password [email or web address] 01/30/2017, 3:06 PM

## 2017-01-30 NOTE — Progress Notes (Signed)
PT Cancellation Note  Patient Details Name: ESTUS KRAKOWSKI MRN: [id number] DOB: 27-Dec-1950   Cancelled AM Treatment:     pt very uncomfortable, bloated ABD and max nausea.  Pt stated "I'll try but first need this tube to be in the right place".  Will check on later today to mobilize.   Cancelled PM Treatment: Tube repositioned and now in right placing.  Max amount expelled reported by RN.  Unable to see in pm as IV nurse is needed to re stick due to previous IV infiltrated.    Will reattempts in morning for mobility.    Rica Koyanagi  PTA [facility]  Acute  Rehab Pager      [phone]

## 2017-01-30 NOTE — Care Management Note (Signed)
Case Management Note  Patient Details  Name: [name] MRN: [id number] Date of Birth: 1950/11/07  Subjective/Objective:   Left TKA                 Action/Plan: Discharge Planning: Spoke to pt at bedside. Wife to assist at home. Offered choice for HH/list provided. Pt [place] arranged with [facility]. Please see previous NCM notes. Pt had RW in room from [facility]. Requesting 3n1 for home. Contacted [facility] DME rep for 3n1. Delivered to room.   PCP Remi Haggard MD   Expected Discharge Date:               Expected Discharge Plan:  [place]  In-House Referral:  NA  Discharge planning Services  CM Consult  Post Acute Care Choice:  Home Health, Durable Medical Equipment Choice offered to:  Patient  DME Arranged:  Walker rolling, 3-N-1 DME Agency:  [place]:  PT [place]:  [place]  Status of Service:  Completed, signed off  If discussed at [place] of Stay Meetings, dates discussed:    Additional Comments:  Erenest Rasher, RN 01/30/2017, 12:15 PM

## 2017-01-30 NOTE — Progress Notes (Signed)
OT Cancellation Note  Patient Details Name: [name] MRN: [id number] DOB: 05/12/1951   Cancelled Treatment:    Reason Eval/Treat Not Completed: Other (comment) Note issues with nausea today and NG tube removal and replacement today. Will check on patient next date.  Jae Dire Knoxx Boeding 01/30/2017, 3:38 PM

## 2017-01-31 ENCOUNTER — Inpatient Hospital Stay (HOSPITAL_COMMUNITY): Payer: Medicare Other

## 2017-01-31 LAB — COMPREHENSIVE METABOLIC PANEL
ALBUMIN: 3.2 g/dL — AB (ref 3.5–5.0)
ALK PHOS: 74 U/L (ref 38–126)
ALT: 29 U/L (ref 17–63)
AST: 23 U/L (ref 15–41)
Anion gap: 14 (ref 5–15)
BILIRUBIN TOTAL: 2 mg/dL — AB (ref 0.3–1.2)
BUN: 47 mg/dL — AB (ref 6–20)
CO2: 23 mmol/L (ref 22–32)
CREATININE: 1.07 mg/dL (ref 0.61–1.24)
Calcium: 8.6 mg/dL — ABNORMAL LOW (ref 8.9–10.3)
Chloride: 98 mmol/L — ABNORMAL LOW (ref 101–111)
GFR calc Af Amer: >60 mL/min (ref >60)
GLUCOSE: 136 mg/dL — AB (ref 65–99)
Potassium: 3.2 mmol/L — ABNORMAL LOW (ref 3.5–5.1)
Sodium: 135 mmol/L (ref 135–145)
TOTAL PROTEIN: 7.2 g/dL (ref 6.5–8.1)

## 2017-01-31 MED ORDER — LORAZEPAM 2 MG/ML IJ SOLN
1.0000 mg | Freq: Three times a day (TID) | INTRAMUSCULAR | Status: DC | PRN
  Administered 2017-01-31 – 2017-02-01 (×2): 1 mg via INTRAMUSCULAR
  Filled 2017-01-31 (×2): qty 1

## 2017-01-31 MED ORDER — ENOXAPARIN SODIUM 30 MG/0.3ML ~~LOC~~ SOLN
30.0000 mg | Freq: Two times a day (BID) | SUBCUTANEOUS | Status: DC
  Administered 2017-01-31 – 2017-02-01 (×2): 30 mg via SUBCUTANEOUS
  Filled 2017-01-31 (×2): qty 0.3

## 2017-01-31 MED ORDER — METHOCARBAMOL 1000 MG/10ML IJ SOLN
500.0000 mg | Freq: Four times a day (QID) | INTRAVENOUS | Status: DC | PRN
  Administered 2017-01-31 – 2017-02-02 (×4): 500 mg via INTRAVENOUS
  Filled 2017-01-31 (×4): qty 550

## 2017-01-31 MED ORDER — POTASSIUM CHLORIDE 10 MEQ/100ML IV SOLN
10.0000 meq | INTRAVENOUS | Status: AC
  Administered 2017-01-31 (×4): 10 meq via INTRAVENOUS
  Filled 2017-01-31 (×4): qty 100

## 2017-01-31 NOTE — Progress Notes (Signed)
Physical Therapy Treatment Patient Details Name: [name] MRN: [id number] DOB: Jun 27, 1950 Today's Date: 01/31/2017    History of Present Illness s/p L TKA    PT Comments    POD # 6 post op ileus Pt feeling "some" better with ABD distention and nausea.  Stated he got to [facility] a couple of time yesterday.  Assisted to EOB required increased time as pt required rest breaks between activity.  Assisted with amb a limited distance in hallway due to nausea.  Positioned upright in recliner.  Performed some TKR TE's but unable to complete all due to increased discomfort ABD and fear of more nausea.    Follow Up Recommendations  DC plan and follow up therapy as arranged by surgeon     Equipment Recommendations  Rolling walker with 5" wheels    Recommendations for Other Services       Precautions / Restrictions Precautions Precautions: Fall;Knee Required Braces or Orthoses: Knee Immobilizer - Left Knee Immobilizer - Left: Discontinue once straight leg raise with < 10 degree lag Restrictions Weight Bearing Restrictions: No Other Position/Activity Restrictions: WBAT    Mobility  Bed Mobility Overal bed mobility: Needs Assistance Bed Mobility: Supine to Sit       Sit to supine: Min assist;Mod assist   General bed mobility comments: assist upper body due to disteneded ABD  Transfers Overall transfer level: Needs assistance Equipment used: Rolling walker (2 wheeled) Transfers: Sit to/from Stand Sit to Stand: Min assist;From elevated surface         General transfer comment: cues for LLE position and hand placement  Ambulation/Gait Ambulation/Gait assistance: Min assist Ambulation Distance (Feet): 27 Feet Assistive device: Rolling walker (2 wheeled) Gait Pattern/deviations: Step-to pattern;Antalgic Gait velocity: decr   General Gait Details: cues for sequence, RW position, breathing, incr time needed, one standing rest d/t pain   Stairs            Wheelchair  Mobility    Modified Rankin (Stroke Patients Only)       Balance                                            Cognition Arousal/Alertness: Awake/alert Behavior During Therapy: WFL for tasks assessed/performed Overall Cognitive Status: Within Functional Limits for tasks assessed                                        Exercises      General Comments        Pertinent Vitals/Pain Pain Assessment: 0-10 Pain Location: left knee Pain Descriptors / Indicators: Constant;Sore;Operative site guarding;Cramping Pain Intervention(s): Monitored during session;Repositioned;Ice applied    Home Living                      Prior Function            PT Goals (current goals can now be found in the care plan section) Progress towards PT goals: Progressing toward goals    Frequency    7X/week      PT Plan Current plan remains appropriate    Co-evaluation              AM-PAC PT "6 Clicks" Daily Activity  Outcome Measure  Difficulty turning over in bed (including adjusting bedclothes,  sheets and blankets)?: Unable Difficulty moving from lying on back to sitting on the side of the bed? : Unable Difficulty sitting down on and standing up from a chair with arms (e.g., wheelchair, bedside commode, etc,.)?: Unable Help needed moving to and from a bed to chair (including a wheelchair)?: Total Help needed walking in hospital room?: Total Help needed climbing 3-5 steps with a railing? : Total 6 Click Score: 6    End of Session Equipment Utilized During Treatment: Gait belt Activity Tolerance: Patient limited by fatigue (nausea)     PT Visit Diagnosis: Difficulty in walking, not elsewhere classified (R26.2);Pain Pain - Right/Left: Left Pain - part of body: Knee     Time: 1030-1053 PT Time Calculation (min) (ACUTE ONLY): 23 min  Charges:  $Gait Training: 8-22 mins $Therapeutic Activity: 8-22 mins                    G Codes:        Rica Koyanagi  PTA [facility]  Acute  Rehab Pager      [phone]

## 2017-01-31 NOTE — Progress Notes (Signed)
OT Cancellation Note  Patient Details Name: [name] MRN: [id number] DOB: [birth year]   Cancelled Treatment:    Reason Eval/Treat Not Completed: Other (comment).  Spoke to [employer].  Pt not feeling well but she would like him to get up at least once today.  Pt is also scheduled with PT.  OT will likely check back tomorrow as we are only here until noon today.  [name] 01/31/2017, 10:31 AM  Lesle Chris, OTR/L [phone] 01/31/2017

## 2017-01-31 NOTE — Progress Notes (Signed)
    Subjective: 6 Days Post-Op Procedure(s) (LRB): LEFT TOTAL KNEE ARTHROPLASTY (Left) Patient reports pain as 7 on 0-10 scale.  (knee).  Pt has Chronic pain.  Denies CP or SOB.  Foley in place. Positive BM. NG tube in place.  Objective: Vital signs in last 24 hours: Temp:  [97.8 F (36.6 C)-98.5 F (36.9 C)] 98.5 F (36.9 C) (09/02 0523) Pulse Rate:  [72-90] 72 (09/02 0750) Resp:  [17-18] 18 (09/02 0523) BP: (129-170)/(56-85) 157/66 (09/02 0750) SpO2:  [92 %-96 %] 96 % (09/02 0523)  Intake/Output from previous day: 09/01 0701 - 09/02 0700 In: 1671.7 [P.O.:270; I.V.:1401.7] Out: 3225 [Urine:1750; Emesis/NG DGUYQI:3474] Intake/Output this shift: No intake/output data recorded.  Labs: No results for input(s): HGB in the last 72 hours. No results for input(s): WBC, RBC, HCT, PLT in the last 72 hours.  Recent Labs  01/30/17 0555 01/31/17 0504  NA 131* 135  K 4.8 3.2*  CL 94* 98*  CO2 22 23  BUN 72* 47*  CREATININE 3.09* 1.07  GLUCOSE 144* 136*  CALCIUM 8.5* 8.6*   No results for input(s): LABPT, INR in the last 72 hours.  Physical Exam: Neurologically intact Sensation intact distally Incision: no discharge, large blisters noted beside wound Compartment soft  Assessment/Plan: 6 Days Post-Op Procedure(s) (LRB): LEFT TOTAL KNEE ARTHROPLASTY (Left) Up with therapy as tolerated Nurse changing dressing Continue to be monitored by [facility]   Hayle Parisi, Darla Lesches for Dr. Melina Schools [facility] [phone] 01/31/2017, 8:45 AM

## 2017-01-31 NOTE — Progress Notes (Signed)
PROGRESS NOTE    [name]  NID:[id number] DOB: 03-23-1951 DOA: 01/25/2017 PCP: Remi Haggard, FNP    Brief Narrative:  66 year old male with history of type 2 diabetes mellitus, hyperlipidemia, hypertension, COPD, CAD, who was admitted on orthopedic surgery service with left knee osteoarthritis and underwent left total knee arthroplasty on 01/25/2017.  Patient has been progressively nauseous and has been complaining of abdominal distention over the last 24 hours, this was associated with orthostatic hypotension requiring several IV fluid boluses.  He denies any chest pain or palpitations.  He denies any fevers or chills.  He does have a cough with minimal sputum production, and that is somewhat chronic.  He reports he has not had a bowel movement in several days, however he is passing gas.  He has a history of intermittent chest pains for which she was admitted on cardiology service in May 2018, underwent a cardiac catheterization which was negative for significant coronary artery disease.  He also has COPD followed by Dr. Annamaria Boots with pulmonology (patient never smoked).  He does have history of extensive intra-abdominal surgery (appendicectomy, Bilroth II), and has had an episode of SBO in 2015.  His surgeon is Dr. Harlow Asa.   Assessment & Plan:   Principal Problem:   OA (osteoarthritis) of knee Active Problems:   Essential hypertension   Mild aortic stenosis by prior echocardiogram   H/O major abdominal surgery   COPD mixed type (HCC)  Abdominal pain, nausea vomiting secondary to post-op ileus -dilated loops of small bowel on abd xray -NG in place with continued output -Continue with bowel rest, anti-emetics, hydration as tolerated -Pos BM this AM -No evidence of obstruction noted on recent imaging, reviewed -When minimal NG output, plan to clamp NG and start trials of clears  COPD -No audible wheezing on exam. -continue Brio elliptical, continue albuterol -Stable at  present  Hypertension -Given orthostatic hypotension, concern for dehydration, discontinued ARB/diuretic combination.  Continue Coreg at a lower dose.  Continue fluids -Will continue with PRN hydralazine as needed  ?CAD with prior history of chest pain -recent cardiac catheterization 3 months ago negative for significant coronary artery disease. -Most recent 2D echo October 2017 showed normal EF 60-65%, mild aortic stenosis and mild regurgitation -Denies cp  Osteoarthritis -pt is s/p L knee arthoplasty -Per Orthopedic Surgery  ARF -Labs reviewed. Cr peaked to 3 on 9/1 -Pt noted to have over 400cc urine in bladder, now s/p cath placement - Likely bladder outlet obstruction - Cr has improved to 1.0 with IVF and foley cath - consider voiding trial in 24-48hrs  DVT prophylaxis: Lovenox subQ Code Status: Full Family Communication: Pt in room, family not in room Disposition Plan: Uncertain at this time  Consultants:     Procedures:     Antimicrobials: Anti-infectives    Start     Dose/Rate Route Frequency Ordered Stop   01/25/17 1800  ceFAZolin (ANCEF) IVPB 2g/100 mL premix     2 g 200 mL/hr over 30 Minutes Intravenous Every 6 hours 01/25/17 1510 01/25/17 2359   01/25/17 1003  ceFAZolin (ANCEF) 2-4 GM/100ML-% IVPB    Comments:  [name]   : cabinet override      01/25/17 1003 01/25/17 1130   01/25/17 0933  ceFAZolin (ANCEF) IVPB 2g/100 mL premix     2 g 200 mL/hr over 30 Minutes Intravenous On call to O.R. 01/25/17 0933 01/25/17 1200      Subjective: Had BM overnight. Complaining of RLE cramping  Objective: Vitals:  01/31/17 0750 01/31/17 0904 01/31/17 1248 01/31/17 1321  BP: (!) 157/66  (!) 177/74 (!) 159/74  Pulse: 72  75 71  Resp:    18  Temp:    98 F (36.7 C)  TempSrc:    Oral  SpO2:  93%  98%  Weight:      Height:        Intake/Output Summary (Last 24 hours) at 01/31/17 1338 Last data filed at 01/31/17 1322  Gross per 24 hour  Intake           1763.34 ml  Output             3350 ml  Net         -1586.66 ml   Filed Weights   01/25/17 0951  Weight: 104.8 kg (231 lb)    Examination: General exam: Awake, laying in bed, in nad Respiratory system: Normal respiratory effort, no wheezing Cardiovascular system: regular rate, s1, s2 Gastrointestinal system: Soft, nondistended, positive BS Central nervous system: CN2-12 grossly intact, strength intact Extremities: Perfused, no clubbing Skin: Normal skin turgor, no notable skin lesions seen Psychiatry: Mood normal // no visual hallucinations   Data Reviewed: I have personally reviewed following labs and imaging studies  CBC:  Recent Labs Lab 01/26/17 0532 01/27/17 0459 01/28/17 0541  WBC 13.2* 13.7* 12.7*  HGB 12.1* 11.7* 12.5*  HCT 34.8* 34.7* 36.6*  MCV 86.1 86.5 87.4  PLT 142* 273 130   Basic Metabolic Panel:  Recent Labs Lab 01/26/17 0532 01/27/17 0459 01/29/17 0741 01/30/17 0555 01/31/17 0504  NA 133* 134* 130* 131* 135  K 4.4 3.5 3.9 4.8 3.2*  CL 101 99* 89* 94* 98*  CO2 23 26 26 22 23   GLUCOSE 133* 138* 130* 144* 136*  BUN 13 14 42* 72* 47*  CREATININE 0.79 0.85 1.83* 3.09* 1.07  CALCIUM 8.9 9.3 9.3 8.5* 8.6*   GFR: Estimated Creatinine Clearance: 86.4 mL/min (by C-G formula based on SCr of 1.07 mg/dL). Liver Function Tests:  Recent Labs Lab 01/29/17 0741 01/30/17 0555 01/31/17 0504  AST 31 35 23  ALT 26 34 29  ALKPHOS 77 79 74  BILITOT 2.6* 2.2* 2.0*  PROT 8.3* 7.4 7.2  ALBUMIN 3.7 3.2* 3.2*   No results for input(s): LIPASE, AMYLASE in the last 168 hours. No results for input(s): AMMONIA in the last 168 hours. Coagulation Profile: No results for input(s): INR, PROTIME in the last 168 hours. Cardiac Enzymes: No results for input(s): CKTOTAL, CKMB, CKMBINDEX, TROPONINI in the last 168 hours. BNP (last 3 results) No results for input(s): PROBNP in the last 8760 hours. HbA1C: No results for input(s): HGBA1C in the last 72  hours. CBG:  Recent Labs Lab 01/27/17 2127 01/29/17 0812 01/29/17 1211 01/29/17 1652 01/29/17 2120  GLUCAP 101* 128* 141* 162* 157*   Lipid Profile: No results for input(s): CHOL, HDL, LDLCALC, TRIG, CHOLHDL, LDLDIRECT in the last 72 hours. Thyroid Function Tests: No results for input(s): TSH, T4TOTAL, FREET4, T3FREE, THYROIDAB in the last 72 hours. Anemia Panel: No results for input(s): VITAMINB12, FOLATE, FERRITIN, TIBC, IRON, RETICCTPCT in the last 72 hours. Sepsis Labs: No results for input(s): PROCALCITON, LATICACIDVEN in the last 168 hours.  Recent Results (from the past 240 hour(s))  Surgical pcr screen     Status: Abnormal   Collection Time: 01/22/17 11:10 AM  Result Value Ref Range Status   MRSA, PCR NEGATIVE NEGATIVE Final   Staphylococcus aureus POSITIVE (A) NEGATIVE Final    Comment:  The Xpert SA Assay (FDA approved for NASAL specimens in patients over 9 years of age), is one component of a comprehensive surveillance program.  Test performance has been validated by [facility] for patients greater than or equal to 35 year old. It is not intended to diagnose infection nor to guide or monitor treatment.   Surgical PCR screen     Status: None   Collection Time: 01/25/17 10:26 AM  Result Value Ref Range Status   MRSA, PCR NEGATIVE NEGATIVE Final   Staphylococcus aureus NEGATIVE NEGATIVE Final    Comment:        The Xpert SA Assay (FDA approved for NASAL specimens in patients over 56 years of age), is one component of a comprehensive surveillance program.  Test performance has been validated by [facility] for patients greater than or equal to 31 year old. It is not intended to diagnose infection nor to guide or monitor treatment. DELTA CHECK NOTED      Radiology Studies: Dg Chest Port 1 View  Result Date: 01/31/2017 CLINICAL DATA:  Shortness of breath and hypoxia.  COPD. EXAM: PORTABLE CHEST 1 VIEW COMPARISON:  01/30/2017 FINDINGS:  Nasogastric tube extends into the stomach. The lungs appear clear. Upper normal heart size. Atherosclerotic calcification of the aortic arch. No blunting of the costophrenic angles. IMPRESSION: 1. Nasogastric tube tip:  Stomach. 2.  Aortic Atherosclerosis (ICD10-I70.0). Electronically Signed   By: Van Clines M.D.   On: 01/31/2017 13:18   Dg Chest Port 1 View  Result Date: 01/30/2017 CLINICAL DATA:  NG tube placement. EXAM: PORTABLE CHEST 1 VIEW COMPARISON:  One-view chest x-ray 01/29/2017. FINDINGS: The heart size is exaggerate by low lung volumes. Pulmonary vascular congestion has increased. No focal airspace disease is present. There are no effusions. The tip of the NG tube is at the thoracic inlet. IMPRESSION: 1. The tip of the NG tube is at the thoracic inlet. 2. Increased pulmonary vascular congestion. Electronically Signed   By: San Morelle M.D.   On: 01/30/2017 08:39   Dg Abd Portable 1v  Result Date: 01/30/2017 CLINICAL DATA:  NG tube verification. EXAM: PORTABLE ABDOMEN - 1 VIEW COMPARISON:  Abdominal radiographs 01/29/2017. FINDINGS: The NG tube is not visible in the lower chest or abdomen. Multiple dilated loops of small bowel are noted, worse on the left. Dilation measures up to 6.1 cm. IMPRESSION: 1. Dilated loops of small bowel without evidence for obstruction. Findings are most compatible with ileus. 2. NG tube is not visualized in the lower chest or abdomen. Electronically Signed   By: San Morelle M.D.   On: 01/30/2017 08:38    Scheduled Meds: . albuterol  2.5 mg Nebulization BID  . docusate sodium  100 mg Oral BID  . enoxaparin (LOVENOX) injection  30 mg Subcutaneous Q12H  . fluticasone  1 spray Each Nare Daily  . fluticasone furoate-vilanterol  1 puff Inhalation Daily  . metoprolol tartrate  2.5 mg Intravenous Q6H  . Naldemedine Tosylate  0.2 mg Oral Q1200  . sodium phosphate  1 enema Rectal Once   Continuous Infusions: . sodium chloride 75 mL/hr at  01/31/17 1124  . methocarbamol (ROBAXIN)  IV Stopped (01/31/17 1254)  . potassium chloride 10 mEq (01/31/17 1132)     LOS: 6 days   CHIU, Orpah Melter, MD [facility] Pager [phone]  If 7PM-7AM, please contact night-coverage [email or web address] Password [email or web address] 01/31/2017, 1:38 PM

## 2017-01-31 NOTE — Progress Notes (Signed)
Pharmacy - Brief Note  Pharmacy asked to evaluate appropriateness to change xarelto to enoxaparin for NPO status (for VTE prophylaxis s/p TKA), NGT in place.  AKI resolved  Plan  CrCl > 75ml/min, increase to 30mg  SQ q12h (discussed with Ronette Deter, PA )  Doreene Eland, PharmD, BCPS.   Pager: [phone] 01/31/2017 11:30 AM

## 2017-02-01 ENCOUNTER — Inpatient Hospital Stay (HOSPITAL_COMMUNITY): Payer: Medicare Other

## 2017-02-01 DIAGNOSIS — I4891 Unspecified atrial fibrillation: Secondary | ICD-10-CM

## 2017-02-01 LAB — TROPONIN I
TROPONIN I: 0.22 ng/mL — AB (ref <0.03)
Troponin I: 0.07 ng/mL (ref <0.03)

## 2017-02-01 LAB — BASIC METABOLIC PANEL
Anion gap: 11 (ref 5–15)
BUN: 26 mg/dL — AB (ref 6–20)
CHLORIDE: 101 mmol/L (ref 101–111)
CO2: 25 mmol/L (ref 22–32)
CREATININE: 0.77 mg/dL (ref 0.61–1.24)
Calcium: 8.8 mg/dL — ABNORMAL LOW (ref 8.9–10.3)
GFR calc Af Amer: >60 mL/min (ref >60)
GFR calc non Af Amer: >60 mL/min (ref >60)
GLUCOSE: 124 mg/dL — AB (ref 65–99)
Potassium: 4 mmol/L (ref 3.5–5.1)
SODIUM: 137 mmol/L (ref 135–145)

## 2017-02-01 LAB — CBC
HCT: 32.2 % — ABNORMAL LOW (ref 39.0–52.0)
Hemoglobin: 11.2 g/dL — ABNORMAL LOW (ref 13.0–17.0)
MCH: 30.1 pg (ref 26.0–34.0)
MCHC: 34.8 g/dL (ref 30.0–36.0)
MCV: 86.6 fL (ref 78.0–100.0)
PLATELETS: 418 10*3/uL — AB (ref 150–400)
RBC: 3.72 MIL/uL — ABNORMAL LOW (ref 4.22–5.81)
RDW: 12.9 % (ref 11.5–15.5)
WBC: 15.3 10*3/uL — AB (ref 4.0–10.5)

## 2017-02-01 LAB — MAGNESIUM: Magnesium: 2.5 mg/dL — ABNORMAL HIGH (ref 1.7–2.4)

## 2017-02-01 MED ORDER — METOPROLOL TARTRATE 5 MG/5ML IV SOLN
5.0000 mg | Freq: Four times a day (QID) | INTRAVENOUS | Status: DC
  Administered 2017-02-01: 5 mg via INTRAVENOUS
  Filled 2017-02-01: qty 5

## 2017-02-01 MED ORDER — DILTIAZEM HCL 100 MG IV SOLR
5.0000 mg/h | INTRAVENOUS | Status: DC
  Administered 2017-02-01: 5 mg/h via INTRAVENOUS
  Administered 2017-02-01: 15 mg/h via INTRAVENOUS
  Filled 2017-02-01 (×4): qty 100

## 2017-02-01 MED ORDER — LABETALOL HCL 5 MG/ML IV SOLN
5.0000 mg | INTRAVENOUS | Status: DC | PRN
  Administered 2017-02-01 – 2017-02-05 (×6): 5 mg via INTRAVENOUS
  Filled 2017-02-01 (×7): qty 4

## 2017-02-01 MED ORDER — DILTIAZEM LOAD VIA INFUSION
15.0000 mg | Freq: Once | INTRAVENOUS | Status: AC
  Administered 2017-02-01: 15 mg via INTRAVENOUS
  Filled 2017-02-01: qty 15

## 2017-02-01 MED ORDER — AMIODARONE HCL IN DEXTROSE 360-4.14 MG/200ML-% IV SOLN
60.0000 mg/h | INTRAVENOUS | Status: DC
  Administered 2017-02-01 (×2): 60 mg/h via INTRAVENOUS
  Filled 2017-02-01 (×2): qty 200

## 2017-02-01 MED ORDER — AMIODARONE HCL IN DEXTROSE 360-4.14 MG/200ML-% IV SOLN
30.0000 mg/h | INTRAVENOUS | Status: DC
  Administered 2017-02-01 – 2017-02-06 (×12): 30 mg/h via INTRAVENOUS
  Filled 2017-02-01 (×10): qty 200

## 2017-02-01 MED ORDER — METOPROLOL TARTRATE 5 MG/5ML IV SOLN
10.0000 mg | Freq: Four times a day (QID) | INTRAVENOUS | Status: DC
  Administered 2017-02-01 – 2017-02-06 (×19): 10 mg via INTRAVENOUS
  Filled 2017-02-01 (×19): qty 10

## 2017-02-01 MED ORDER — ENOXAPARIN SODIUM 30 MG/0.3ML ~~LOC~~ SOLN
30.0000 mg | SUBCUTANEOUS | Status: AC
  Administered 2017-02-01: 30 mg via SUBCUTANEOUS
  Filled 2017-02-01: qty 0.3

## 2017-02-01 MED ORDER — ENOXAPARIN SODIUM 100 MG/ML ~~LOC~~ SOLN
100.0000 mg | Freq: Two times a day (BID) | SUBCUTANEOUS | Status: DC
  Administered 2017-02-01 – 2017-02-05 (×9): 100 mg via SUBCUTANEOUS
  Filled 2017-02-01 (×10): qty 1

## 2017-02-01 MED ORDER — AMIODARONE LOAD VIA INFUSION
150.0000 mg | Freq: Once | INTRAVENOUS | Status: AC
  Administered 2017-02-01: 150 mg via INTRAVENOUS
  Filled 2017-02-01: qty 83.34

## 2017-02-01 NOTE — Progress Notes (Signed)
Physical Therapy Treatment Patient Details Name: [name] MRN: [id number] DOB: Nov 13, 1950 Today's Date: 02/01/2017    History of Present Illness s/p L TKA post op Ileus    PT Comments    POD # 7 Pt still present with NG tube.  Assisted OOB to amb however distance limited by MAX c/o fatigue and elevated HR.  See details below.   Follow Up Recommendations  DC plan and follow up therapy as arranged by surgeon     Equipment Recommendations  Rolling walker with 5" wheels    Recommendations for Other Services       Precautions / Restrictions Precautions Precautions: Fall;Knee Restrictions Weight Bearing Restrictions: No Other Position/Activity Restrictions: WBAT    Mobility  Bed Mobility Overal bed mobility: Needs Assistance Bed Mobility: Supine to Sit     Supine to sit: Supervision;Min guard     General bed mobility comments: increased ability to self peform with HOB elevated and use of rail Assisted back to bed per RN request.   Transfers Overall transfer level: Needs assistance Equipment used: Rolling walker (2 wheeled) Transfers: Sit to/from Stand Sit to Stand: Supervision;Min guard         General transfer comment: <25% cues for LLE position and hand placement  Ambulation/Gait Ambulation/Gait assistance: Min guard;Min assist Ambulation Distance (Feet): 12 Feet Assistive device: Rolling walker (2 wheeled) Gait Pattern/deviations: Step-to pattern;Antalgic Gait velocity: decreased    General Gait Details: amb distance limited to MAX c/o fatigue and stated "feels like my heart is skipping".  HR range 133 - 164.  RN notified immediately.    Stairs            Wheelchair Mobility    Modified Rankin (Stroke Patients Only)       Balance                                            Cognition Arousal/Alertness: Awake/alert Behavior During Therapy: WFL for tasks assessed/performed Overall Cognitive Status: Within Functional  Limits for tasks assessed                                        Exercises      General Comments        Pertinent Vitals/Pain Pain Assessment: Faces Faces Pain Scale: Hurts a little bit Pain Location: ABD Pain Descriptors / Indicators: Tightness Pain Intervention(s): Monitored during session    Home Living                      Prior Function            PT Goals (current goals can now be found in the care plan section) Progress towards PT goals: Progressing toward goals    Frequency    7X/week      PT Plan Current plan remains appropriate    Co-evaluation              AM-PAC PT "6 Clicks" Daily Activity  Outcome Measure  Difficulty turning over in bed (including adjusting bedclothes, sheets and blankets)?: Unable Difficulty moving from lying on back to sitting on the side of the bed? : Unable Difficulty sitting down on and standing up from a chair with arms (e.g., wheelchair, bedside commode, etc,.)?: Unable Help needed moving  to and from a bed to chair (including a wheelchair)?: Total Help needed walking in hospital room?: Total Help needed climbing 3-5 steps with a railing? : Total 6 Click Score: 6    End of Session Equipment Utilized During Treatment: Gait belt Activity Tolerance: Other (comment) (tacky) Patient left: in bed;with call bell/phone within reach   PT Visit Diagnosis: Difficulty in walking, not elsewhere classified (R26.2);Pain Pain - Right/Left: Left Pain - part of body: Knee     Time: 1010-1037 PT Time Calculation (min) (ACUTE ONLY): 27 min  Charges:  $Gait Training: 8-22 mins $Therapeutic Activity: 8-22 mins                    G Codes:       Rica Koyanagi  PTA [facility]  Acute  Rehab Pager      [phone]

## 2017-02-01 NOTE — Progress Notes (Signed)
Patient with HR in Afib up to 170. Cardizem gtt up to 15mg . Dr. Earlie Counts is on the unit and was made aware. Receiving orders for the addition of Amiodarone gtt and cardiology consult.

## 2017-02-01 NOTE — Progress Notes (Signed)
   Subjective: 7 Days Post-Op Procedure(s) (LRB): LEFT TOTAL KNEE ARTHROPLASTY (Left)  Currently remains NPO with NG tube  Pain is tolerable Ativan is helping with muscle spasms more than robaxin Denies any new symptoms or issues Patient reports pain as moderate.  Objective:   VITALS:   Vitals:   01/31/17 2102 02/01/17 0526  BP: (!) 155/72 (!) 164/82  Pulse: 88 71  Resp: 16 16  Temp: 98.6 F (37 C) 98.4 F (36.9 C)  SpO2: 100% 100%    Left knee incision: healing but 2 small blisters at distal incision nv intact distally No erythema or drainage, but drainage will occur when blisters burst No other rashes or edema  LABS  Recent Labs  02/01/17 0424  HGB 11.2*  HCT 32.2*  WBC 15.3*  PLT 418*     Recent Labs  01/30/17 0555 01/31/17 0504 02/01/17 0424  NA 131* 135 137  K 4.8 3.2* 4.0  BUN 72* 47* 26*  CREATININE 3.09* 1.07 0.77  GLUCOSE 144* 136* 124*     Assessment/Plan: 7 Days Post-Op Procedure(s) (LRB): LEFT TOTAL KNEE ARTHROPLASTY (Left) Knee is stable currently Will continue to watch the blisters anteriorly but no signs of infection Therapy as able Pain management along with muscle relaxant    Merla Riches, MPAS, PA-C  02/01/2017, 8:02 AM

## 2017-02-01 NOTE — Progress Notes (Signed)
Patient transferred to room 1226 on bed. All belongings w/ patient. Report called to [employer]. Family notified of transfer.

## 2017-02-01 NOTE — Progress Notes (Signed)
CRITICAL VALUE ALERT  Critical Value:  Troponin 0.07  Date & Time Notied:  02/01/17 1405  Provider Notified: Dr. Wyline Copas  Orders Received/Actions taken: no new orders at this time

## 2017-02-01 NOTE — Progress Notes (Signed)
PROGRESS NOTE    AUTHOR HATLESTAD  WFU:[id number] DOB: 01/21/1951 DOA: 01/25/2017 PCP: Remi Haggard, FNP    Brief Narrative:  66 year old male with history of type 2 diabetes mellitus, hyperlipidemia, hypertension, COPD, CAD, who was admitted on orthopedic surgery service with left knee osteoarthritis and underwent left total knee arthroplasty on 01/25/2017.  Patient has been progressively nauseous and has been complaining of abdominal distention over the last 24 hours, this was associated with orthostatic hypotension requiring several IV fluid boluses.  He denies any chest pain or palpitations.  He denies any fevers or chills.  He does have a cough with minimal sputum production, and that is somewhat chronic.  He reports he has not had a bowel movement in several days, however he is passing gas.  He has a history of intermittent chest pains for which she was admitted on cardiology service in May 2018, underwent a cardiac catheterization which was negative for significant coronary artery disease.  He also has COPD followed by Dr. Annamaria Boots with pulmonology (patient never smoked).  He does have history of extensive intra-abdominal surgery (appendicectomy, Bilroth II), and has had an episode of SBO in 2015.  His surgeon is Dr. Harlow Asa.   Assessment & Plan:   Principal Problem:   OA (osteoarthritis) of knee Active Problems:   Essential hypertension   Mild aortic stenosis by prior echocardiogram   H/O major abdominal surgery   COPD mixed type (HCC)  Abdominal pain, nausea vomiting secondary to post-op ileus -dilated loops of small bowel on abd xray -NG in place with continued output -Continue with bowel rest, anti-emetics, hydration as tolerated -Pos BM 9/2 -No evidence of obstruction noted on recent imaging -Anticipate trial of ice chips when minimal NG residuals  COPD -No audible wheezing on exam. -continue Brio elliptical, continue albuterol -Stable at present  Hypertension -Given  orthostatic hypotension, concern for dehydration, discontinued ARB/diuretic combination.   -Continued on IV lopressor. Increase dose to 5mg  q6hrs  ?CAD with prior history of chest pain -recent cardiac catheterization 3 months ago negative for significant coronary artery disease. -Most recent 2D echo October 2017 showed normal EF 60-65%, mild aortic stenosis and mild regurgitation -complains of chest tightness and mild sob with concurrent RVR -Trop of 0.07, likely demand mismatch secondary to tachycardia. Follow trop trends. Aim to control HR  Osteoarthritis -pt is s/p L knee arthoplasty -Per Orthopedic Surgery  ARF -Labs reviewed. Cr has normalized -Continuing on IVF  Afib with RVR -New diagnosis, reviewed on EKG -Electrolytes w/in normal limits. Recent TSH within normal limit -failed trial of PRN labetalol IV -Started patient on cardizem gtt, transfer to SDU. -HR remains uncontrolled in the 150's-160's -Will start Amiodarone gtt -CHADS-VASc of 4. Discussed with Orthopedic Surgery. OK to start full dose anticoagulation. Will increase lovenox from prophylactic to treatment dose. -Will request 2d echo given new afib -discussed with Orthopedic Surgery. OK to start full anticoagulation -Consult Cardiology for further assistance  DVT prophylaxis: Lovenox subQ Code Status: Full Family Communication: Pt in room, family not in room Disposition Plan: Uncertain at this time  Consultants:   Orthopedic Surgery  Procedures:     Antimicrobials: Anti-infectives    Start     Dose/Rate Route Frequency Ordered Stop   01/25/17 1800  ceFAZolin (ANCEF) IVPB 2g/100 mL premix     2 g 200 mL/hr over 30 Minutes Intravenous Every 6 hours 01/25/17 1510 01/25/17 2359   01/25/17 1003  ceFAZolin (ANCEF) 2-4 GM/100ML-% IVPB    Comments:  Ronnald Ramp,  Sherrie   : cabinet override      01/25/17 1003 01/25/17 1130   01/25/17 0933  ceFAZolin (ANCEF) IVPB 2g/100 mL premix     2 g 200 mL/hr over 30  Minutes Intravenous On call to O.R. 01/25/17 0933 01/25/17 1200      Subjective: Complaining of palpitations, chest tightness, subjective sob  Objective: Vitals:   02/01/17 0526 02/01/17 0911 02/01/17 1050 02/01/17 1119  BP: (!) 164/82  (!) 145/101 (!) 148/117  Pulse: 71  (!) 163 (!) 164  Resp: 16     Temp: 98.4 F (36.9 C)     TempSrc: Oral     SpO2: 100% 96%    Weight:      Height:        Intake/Output Summary (Last 24 hours) at 02/01/17 1225 Last data filed at 02/01/17 1100  Gross per 24 hour  Intake             1685 ml  Output             2850 ml  Net            -1165 ml   Filed Weights   01/25/17 0951  Weight: 104.8 kg (231 lb)    Examination: General exam: Conversant, in no acute distress Respiratory system: normal chest rise, clear, no audible wheezing Cardiovascular system: irregularly irregular, tachycardic Gastrointestinal system: Nondistended, nontender, pos BS Central nervous system: No seizures, no tremors Extremities: No cyanosis, no joint deformities Skin: No rashes, no pallor Psychiatry: Affect normal // no auditory hallucinations    Data Reviewed: I have personally reviewed following labs and imaging studies  CBC:  Recent Labs Lab 01/26/17 0532 01/27/17 0459 01/28/17 0541 02/01/17 0424  WBC 13.2* 13.7* 12.7* 15.3*  HGB 12.1* 11.7* 12.5* 11.2*  HCT 34.8* 34.7* 36.6* 32.2*  MCV 86.1 86.5 87.4 86.6  PLT 142* 273 318 269*   Basic Metabolic Panel:  Recent Labs Lab 01/27/17 0459 01/29/17 0741 01/30/17 0555 01/31/17 0504 02/01/17 0424 02/01/17 1050  NA 134* 130* 131* 135 137  --   K 3.5 3.9 4.8 3.2* 4.0  --   CL 99* 89* 94* 98* 101  --   CO2 26 26 22 23 25   --   GLUCOSE 138* 130* 144* 136* 124*  --   BUN 14 42* 72* 47* 26*  --   CREATININE 0.85 1.83* 3.09* 1.07 0.77  --   CALCIUM 9.3 9.3 8.5* 8.6* 8.8*  --   MG  --   --   --   --   --  2.5*   GFR: Estimated Creatinine Clearance: 115.5 mL/min (by C-G formula based on SCr of  0.77 mg/dL). Liver Function Tests:  Recent Labs Lab 01/29/17 0741 01/30/17 0555 01/31/17 0504  AST 31 35 23  ALT 26 34 29  ALKPHOS 77 79 74  BILITOT 2.6* 2.2* 2.0*  PROT 8.3* 7.4 7.2  ALBUMIN 3.7 3.2* 3.2*   No results for input(s): LIPASE, AMYLASE in the last 168 hours. No results for input(s): AMMONIA in the last 168 hours. Coagulation Profile: No results for input(s): INR, PROTIME in the last 168 hours. Cardiac Enzymes: No results for input(s): CKTOTAL, CKMB, CKMBINDEX, TROPONINI in the last 168 hours. BNP (last 3 results) No results for input(s): PROBNP in the last 8760 hours. HbA1C: No results for input(s): HGBA1C in the last 72 hours. CBG:  Recent Labs Lab 01/27/17 2127 01/29/17 0812 01/29/17 1211 01/29/17 1652 01/29/17 2120  GLUCAP 101*  128* 141* 162* 157*   Lipid Profile: No results for input(s): CHOL, HDL, LDLCALC, TRIG, CHOLHDL, LDLDIRECT in the last 72 hours. Thyroid Function Tests: No results for input(s): TSH, T4TOTAL, FREET4, T3FREE, THYROIDAB in the last 72 hours. Anemia Panel: No results for input(s): VITAMINB12, FOLATE, FERRITIN, TIBC, IRON, RETICCTPCT in the last 72 hours. Sepsis Labs: No results for input(s): PROCALCITON, LATICACIDVEN in the last 168 hours.  Recent Results (from the past 240 hour(s))  Surgical PCR screen     Status: None   Collection Time: 01/25/17 10:26 AM  Result Value Ref Range Status   MRSA, PCR NEGATIVE NEGATIVE Final   Staphylococcus aureus NEGATIVE NEGATIVE Final    Comment:        The Xpert SA Assay (FDA approved for NASAL specimens in patients over 42 years of age), is one component of a comprehensive surveillance program.  Test performance has been validated by [facility] for patients greater than or equal to 63 year old. It is not intended to diagnose infection nor to guide or monitor treatment. DELTA CHECK NOTED      Radiology Studies: Dg Chest Port 1 View  Result Date: 01/31/2017 CLINICAL DATA:   Shortness of breath and hypoxia.  COPD. EXAM: PORTABLE CHEST 1 VIEW COMPARISON:  01/30/2017 FINDINGS: Nasogastric tube extends into the stomach. The lungs appear clear. Upper normal heart size. Atherosclerotic calcification of the aortic arch. No blunting of the costophrenic angles. IMPRESSION: 1. Nasogastric tube tip:  Stomach. 2.  Aortic Atherosclerosis (ICD10-I70.0). Electronically Signed   By: Van Clines M.D.   On: 01/31/2017 13:18    Scheduled Meds: . albuterol  2.5 mg Nebulization BID  . diltiazem  15 mg Intravenous Once  . docusate sodium  100 mg Oral BID  . enoxaparin (LOVENOX) injection  30 mg Subcutaneous Q12H  . fluticasone  1 spray Each Nare Daily  . fluticasone furoate-vilanterol  1 puff Inhalation Daily  . metoprolol tartrate  5 mg Intravenous Q6H  . Naldemedine Tosylate  0.2 mg Oral Q1200  . sodium phosphate  1 enema Rectal Once   Continuous Infusions: . sodium chloride 75 mL/hr at 02/01/17 1100  . diltiazem (CARDIZEM) infusion    . methocarbamol (ROBAXIN)  IV 500 mg (02/01/17 0437)     LOS: 7 days   Anyla Israelson, Orpah Melter, MD [facility] Pager [phone]  If 7PM-7AM, please contact night-coverage [email or web address] Password [email or web address] 02/01/2017, 12:25 PM

## 2017-02-01 NOTE — Progress Notes (Signed)
HR in the 140-160's Afib. And non-sustained VT on occasion.Asymptomatic. Pt. on max. dose of Cardizem @15ml /hr and Amiodarone @ 30 mg/hr. PRN Labetalol given. Hydralazine prn given for high BP.

## 2017-02-01 NOTE — Progress Notes (Signed)
OT Cancellation Note  Patient Details Name: [name] MRN: [id number] DOB: Mar 24, 1951   Cancelled Treatment:    Reason Eval/Treat Not Completed: Medical issues which prohibited therapy Will check on pt next day.  [name] 02/01/2017, 11:23 AM

## 2017-02-01 NOTE — Progress Notes (Signed)
HR still Afib up to 140-150's while on Cardizem gtt at ceiling, amiodarone gtt, and pateint at rest. Dr. Wyline Copas made aware. Orders received to increase IV lopressor to 10mg  Q6H.

## 2017-02-02 ENCOUNTER — Inpatient Hospital Stay (HOSPITAL_COMMUNITY): Payer: Medicare Other

## 2017-02-02 DIAGNOSIS — I480 Paroxysmal atrial fibrillation: Secondary | ICD-10-CM

## 2017-02-02 DIAGNOSIS — M175 Other unilateral secondary osteoarthritis of knee: Secondary | ICD-10-CM

## 2017-02-02 DIAGNOSIS — R0902 Hypoxemia: Secondary | ICD-10-CM

## 2017-02-02 DIAGNOSIS — R748 Abnormal levels of other serum enzymes: Secondary | ICD-10-CM

## 2017-02-02 DIAGNOSIS — K567 Ileus, unspecified: Secondary | ICD-10-CM

## 2017-02-02 LAB — BASIC METABOLIC PANEL
Anion gap: 10 (ref 5–15)
BUN: 17 mg/dL (ref 6–20)
CALCIUM: 8.8 mg/dL — AB (ref 8.9–10.3)
CO2: 26 mmol/L (ref 22–32)
CREATININE: 0.63 mg/dL (ref 0.61–1.24)
Chloride: 103 mmol/L (ref 101–111)
GFR calc non Af Amer: >60 mL/min (ref >60)
Glucose, Bld: 167 mg/dL — ABNORMAL HIGH (ref 65–99)
Potassium: 3.1 mmol/L — ABNORMAL LOW (ref 3.5–5.1)
Sodium: 139 mmol/L (ref 135–145)

## 2017-02-02 LAB — ECHOCARDIOGRAM COMPLETE
HEIGHTINCHES: 73 in
WEIGHTICAEL: 3559.11 [oz_av]

## 2017-02-02 LAB — CBC
HCT: 33.1 % — ABNORMAL LOW (ref 39.0–52.0)
Hemoglobin: 11.3 g/dL — ABNORMAL LOW (ref 13.0–17.0)
MCH: 29.1 pg (ref 26.0–34.0)
MCHC: 34.1 g/dL (ref 30.0–36.0)
MCV: 85.3 fL (ref 78.0–100.0)
PLATELETS: 530 10*3/uL — AB (ref 150–400)
RBC: 3.88 MIL/uL — AB (ref 4.22–5.81)
RDW: 13 % (ref 11.5–15.5)
WBC: 17.8 10*3/uL — ABNORMAL HIGH (ref 4.0–10.5)

## 2017-02-02 LAB — TROPONIN I
TROPONIN I: 0.14 ng/mL — AB (ref <0.03)
TROPONIN I: 0.14 ng/mL — AB (ref <0.03)
Troponin I: 0.27 ng/mL (ref <0.03)
Troponin I: 0.22 ng/mL (ref <0.03)

## 2017-02-02 LAB — MRSA PCR SCREENING: MRSA BY PCR: NEGATIVE

## 2017-02-02 MED ORDER — DIGOXIN 0.25 MG/ML IJ SOLN: 0.2500 mg | Freq: Every day | INTRAMUSCULAR | Status: DC

## 2017-02-02 MED ORDER — POTASSIUM CHLORIDE 10 MEQ/100ML IV SOLN
10.0000 meq | INTRAVENOUS | Status: AC
  Administered 2017-02-02 (×3): 10 meq via INTRAVENOUS
  Filled 2017-02-02 (×3): qty 100

## 2017-02-02 MED ORDER — KETOROLAC TROMETHAMINE 15 MG/ML IJ SOLN
15.0000 mg | Freq: Four times a day (QID) | INTRAMUSCULAR | Status: AC | PRN
  Administered 2017-02-05: 15 mg via INTRAVENOUS
  Filled 2017-02-02: qty 1

## 2017-02-02 MED ORDER — POTASSIUM CHLORIDE 10 MEQ/100ML IV SOLN
10.0000 meq | Freq: Once | INTRAVENOUS | Status: AC
  Administered 2017-02-02: 10 meq via INTRAVENOUS
  Filled 2017-02-02: qty 100

## 2017-02-02 MED ORDER — DILTIAZEM HCL-DEXTROSE 100-5 MG/100ML-% IV SOLN (PREMIX)
5.0000 mg/h | INTRAVENOUS | Status: DC
  Administered 2017-02-02 (×4): 15 mg/h via INTRAVENOUS
  Filled 2017-02-02 (×3): qty 100

## 2017-02-02 MED ORDER — HOME MED STORE IN PYXIS: Freq: Two times a day (BID) | Status: DC | PRN

## 2017-02-02 MED ORDER — MORPHINE SULFATE (PF) 2 MG/ML IV SOLN
1.0000 mg | Freq: Once | INTRAVENOUS | Status: AC
  Administered 2017-02-02: 1 mg via INTRAVENOUS
  Filled 2017-02-02: qty 1

## 2017-02-02 MED ORDER — POTASSIUM CHLORIDE 10 MEQ/100ML IV SOLN: 10.0000 meq | INTRAVENOUS | Status: DC

## 2017-02-02 MED ORDER — DIGOXIN 0.25 MG/ML IJ SOLN
0.2500 mg | INTRAMUSCULAR | Status: AC
  Administered 2017-02-02 (×3): 0.25 mg via INTRAVENOUS
  Filled 2017-02-02 (×3): qty 1

## 2017-02-02 MED ORDER — DIGOXIN 0.25 MG/ML IJ SOLN
0.2500 mg | Freq: Every day | INTRAMUSCULAR | Status: DC
  Administered 2017-02-03 – 2017-02-05 (×3): 0.25 mg via INTRAVENOUS
  Filled 2017-02-02 (×4): qty 1

## 2017-02-02 MED ORDER — DILTIAZEM HCL 60 MG PO TABS
60.0000 mg | ORAL_TABLET | Freq: Three times a day (TID) | ORAL | Status: DC
  Administered 2017-02-02 – 2017-02-08 (×12): 60 mg via ORAL
  Filled 2017-02-02 (×15): qty 1

## 2017-02-02 MED ORDER — BISACODYL 10 MG RE SUPP
10.0000 mg | Freq: Once | RECTAL | Status: AC
  Administered 2017-02-02: 10 mg via RECTAL
  Filled 2017-02-02: qty 1

## 2017-02-02 NOTE — Progress Notes (Signed)
CRITICAL VALUE ALERT  Critical Value:  Troponin 0.27  Date & Time Notied: 02/02/17 0030   Provider Notified: Yes  Orders Received/Actions taken: Troponin labs X 3 q 6hrs.

## 2017-02-02 NOTE — Consult Note (Addendum)
Cardiology Consultation:   Patient ID: [name]; [id number]; 05-02-1951   Admit date: 01/25/2017 Date of Consult: 02/02/2017  Primary Care Provider: Remi Haggard, FNP Primary Cardiologist: [name] Electrophysiologist:  None     History of Present Illness:   Asked to consult on patient for PAF.  Referred by Dr [name] hospitalist  [name] 66 y.o. post left TKR comlicated by ileus requiring NG tube and PaF. Had cath 3 months ago by Dr [name] with no CAD. He was walking On unit yesterday and had some dizziness due to rapid afib Not taking PO due to ileus Rate hard to control on amiodarone and cardizem drip. He denies Chest pain or dyspnea  Did have palpitations with onset of afib . Passing some gas no BM Left knee feels good. 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 History of LLE DVT no issues with blood thinners in past  This patients CHA2DS2-VASc Score and unadjusted Ischemic Stroke Rate (% per year) is equal to 3.2 % stroke rate/year from a score of 3  Above score calculated as 1 point each if present [CHF, HTN, DM, Vascular=MI/PAD/Aortic Plaque, Age if 65-74, or Male] Above score calculated as 2 points each if present [Age > 75, or Stroke/TIA/TE]   Past Medical History:  Diagnosis Date  . Anemia   . Asthma   . Back pain, chronic, followed at pain clinic 11/25/2011  . Carotid artery stenosis 09/06/2014  . Chest pain with low risk for cardiac etiology 10/2011   Non-obstructive CAD by Cath; negative Lexiscan in 08/2011  . Clotting disorder (HCC)    LLE DVT (also small vessel)  . Colon polyps   . COPD (chronic obstructive pulmonary disease) ([place])   . Dementia    Mild  . Depression   . Dyslipidemia (high LDL; low HDL)    statin intolerant; on fibrate  . Edema leg    Wears compression stockings; 08/2012 dopplers - no DVT or thrombophlebitis; mild R Popliteal V reflux - no VNUS ablation candidate  .  Fainting   . Family history of acute myocardial infarction. and premature CAD 11/25/2011  . GERD (gastroesophageal reflux disease)   . Hearing loss   . History of diabetes mellitus   . Hypertension    very labile  . Mild aortic stenosis by prior echocardiogram 03/2012   moderate sclerosis  . Nephrolithiasis   . Osteoarthritis   . Other idiopathic peripheral autonomic neuropathy    Agent orange  . PTSD (post-traumatic stress disorder) 07/24/2015   per patient approach from foot of bed to awake; do not apply any contricting pressure, also avoid approaching from behind with any loud noises   . Seizure ([place])   . Small bowel obstruction ([place]) 1990s, 2001, 2015   s/p multiple bowel surgeries; from war wounds  . Stroke ([place])   . Weakness     Past Surgical History:  Procedure Laterality Date  . APPENDECTOMY  1958   per patient  . CARDIOVASCULAR STRESS TEST  08/2011   Negative lexiscan myoview  . Carotid Dopplers  01/2016   Stable. RICA - slight progression from<40% to 40-59%.  Otherwise stable bilateral carotids and subclavian arteries. Stable LICA  . COLONOSCOPY    . DENTAL SURGERY  01/14/2017   5 extractions in preparation for LTKA on 01-25-17; also was started on amoxicillin x 7days; has since completed therapy   . exploratory laparotomy with extensive lysis of adhesions  10/1999  . JOINT REPLACEMENT     right knee replaced 2x  . LEFT HEART CATHETERIZATION WITH CORONARY ANGIOGRAM N/A 11/26/2011   WNL William Harp, MD  . RIGHT/LEFT HEART CATH AND CORONARY ANGIOGRAPHY N/A 10/22/2016   Procedure: Right/Left Heart Cath and Coronary Angiography;  Surgeon: Leonie Man, MD;  Location: [facility];  Service: Cardiovascular;  Laterality: N/A;  . small bowel obstruction  1996, 1999, 2001, 2015  . TONSILLECTOMY  1960   per patient  . TOTAL KNEE ARTHROPLASTY    . TOTAL KNEE ARTHROPLASTY Left 01/25/2017   Procedure: LEFT TOTAL KNEE ARTHROPLASTY;  Surgeon: William Arabian, MD;   Location: [facility];  Service: Orthopedics;  Laterality: Left;  Adductor Block  . TRANSTHORACIC ECHOCARDIOGRAM  03/24/2016   Mild LVH. EF 60-65%. GR 1 DD. Severely restricted calcified noncoronary cusp of the aortic valve. Mild aortic stenosis (mean gradient 17 mmHg) with mild regurgitation.  . TRANSTHORACIC ECHOCARDIOGRAM  2012; 03/2012   a. Essentially normal wirth moderate concentric LVH and moderate aortic sclerosis; b. Mild concentric LVH. EF greater than 55%. Mild aortic stenosis. Grade 1 diastolic dysfunction     Home Medications:  Prior to Admission medications   Medication Sig Start Date End Date Taking? Authorizing Provider  albuterol (PROVENTIL HFA) 108 (90 BASE) MCG/ACT inhaler Inhale 2 puffs into the lungs every 4 (four) hours as needed. For wheezing and/or shortness of breath 10/09/14  Yes Young, Tarri Fuller D, MD  albuterol (PROVENTIL) (2.5 MG/3ML) 0.083% nebulizer solution Take 3 mLs (2.5 mg total) by nebulization every 6 (six) hours as needed for wheezing or shortness of breath. 01/10/15  Yes Young, Tarri Fuller D, MD  ALPRAZolam Duanne Moron) 1 MG tablet Take 1 tablet (1 mg total) by mouth 3 (three) times daily as needed for anxiety or sleep. Patient taking differently: Take 1-2 mg by mouth every 8 (eight) hours as needed (for anxiety/PTSD).  01/22/14  Yes Theodis Blaze, MD  aspirin 81 MG chewable tablet Chew 81 mg by mouth daily.   Yes [provider]  beclomethasone (BECONASE-AQ) 42 MCG/SPRAY nasal spray Place 1 spray into both nostrils daily. Dose is for each nostril.   Yes [provider]  benzonatate (TESSALON) 100 MG capsule Take 100 mg by mouth 3 (three) times daily as needed for cough (for wheezing/cough).   Yes [provider]  budesonide-formoterol (SYMBICORT) 160-4.5 MCG/ACT inhaler 2 puffs then rinse mouth well, twice daily 05/28/16  Yes Young, Clinton D, MD  carvedilol (COREG) 25 MG tablet TAKE 1 TABLET (25 MG TOTAL) BY MOUTH TWICE DAILY. 03/30/16  Yes Leonie Man, MD  Cholecalciferol (VITAMIN D) 2000 units tablet Take 12,000 Units by mouth 3 (three) times daily.   Yes [provider]  furosemide (LASIX) 20 MG tablet Take 1 tablet (20 mg total) by mouth daily. MAY TAKE ADDITIONAL TABLET AS NEEDED SWELLING OR SHORTNESS OF BREATH Patient taking differently: Take 20 mg by mouth daily as needed (for fluid retention (knee/feet/hands)). MAY TAKE ADDITIONAL TABLET AS NEEDED SWELLING OR SHORTNESS OF BREATH 03/30/16  Yes Leonie Man, MD  hydrALAZINE (APRESOLINE) 25 MG tablet Take 25 mg by mouth daily as needed (for high blood pressure at or above 150).   Yes [provider]  irbesartan-hydrochlorothiazide (AVALIDE) 300-12.5 MG tablet TAKE 1 TABLET BY MOUTH DAILY. 02/24/16  Yes Leonie Man, MD  magnesium hydroxide (MILK OF MAGNESIA) 400 MG/5ML suspension Take 15 mLs by mouth 3 (three) times daily as needed for mild constipation  or indigestion.   Yes [provider]  morphine (MSIR) 30 MG tablet Take 1 tablet (30 mg total) by mouth every 4 (four) hours as needed. FOR PAIN Patient taking differently: Take 30 mg by mouth every 4 (four) hours as needed for moderate pain.  01/22/14  Yes Theodis Blaze, MD  Multiple Vitamins-Minerals (CENTRUM SILVER PO) Take 1 capsule by mouth daily.   Yes [provider]  Naldemedine Tosylate (SYMPROIC) 0.2 MG TABS Take 0.2 mg by mouth daily at 12 noon.    Yes [provider]  nitroGLYCERIN (NITROSTAT) 0.4 MG SL tablet Place 1 tablet (0.4 mg total) under the tongue every 5 (five) minutes as needed for chest pain. 10/15/16  Yes Kilroy, Luke K, PA-C  polyethylene glycol (MIRALAX / GLYCOLAX) packet Take 17 g by mouth 2 (two) times daily as needed for moderate constipation.    Yes [provider]  promethazine (PHENERGAN) 25 MG tablet Take 1 tablet (25 mg total) by mouth every 6 (six) hours as needed. FOR NAUSEA Patient taking differently: Take 25 mg by mouth every 6 (six) hours  as needed for nausea or vomiting. FOR NAUSEA 01/22/14  Yes Theodis Blaze, MD  Respiratory Therapy Supplies (FLUTTER) DEVI Use as directed 01/10/15  Yes Baird Lyons D, MD  vitamin E (VITAMIN E) 400 UNIT capsule Take 400 Units by mouth daily.   Yes [provider]  cyclobenzaprine (FLEXERIL) 5 MG tablet Take 1 tablet (5 mg total) by mouth 3 (three) times daily as needed (for muscle cramps). 01/26/17   Perkins, Alexzandrew L, PA-C  famotidine (PEPCID) 20 MG tablet Take 1 tablet (20 mg total) by mouth daily. 1 TAB (20MG ) @ HS ON 5-23 AND 5-24 AM BEFORE PROCEDURE Patient not taking: Reported on 01/18/2017 10/15/16   Erlene Quan, PA-C  isosorbide mononitrate (IMDUR) 30 MG 24 hr tablet Take 1 tablet (30 mg total) by mouth daily. TAKE IN THE MORNING WITH HYDRALAZINE Patient not taking: Reported on 01/18/2017 03/30/16 10/19/16  Leonie Man, MD  morphine (MSIR) 30 MG tablet Take 1-2 tablets (30-60 mg total) by mouth every 4 (four) hours as needed for moderate pain. 01/26/17   Perkins, Alexzandrew L, PA-C  predniSONE (DELTASONE) 20 MG tablet Take 60 mg ( 3 tablets ) at 6 pm and bedtime day prior to procedure and the morning of procedure and the afternoon of procedure. Patient not taking: Reported on 01/08/2017 08/31/16   Erma Heritage, PA-C  predniSONE (DELTASONE) 20 MG tablet Take 1 tablet (20 mg total) by mouth daily with breakfast. 6 (20MG )TAB AT 6PM ON 5-23 AND 6 (20MG ) TAB AT HS 5-23 & 6TAB(20MG )ON 2-24 AM Patient not taking: Reported on 01/08/2017 10/15/16   Erlene Quan, PA-C  rivaroxaban (XARELTO) 10 MG TABS tablet Take 1 tablet (10 mg total) by mouth daily with breakfast. Take Xarelto for two and a half more weeks following discharge from the hospital, then discontinue Xarelto. Once the patient has completed the Xarelto, they may resume the 81 mg Aspirin. 01/27/17   Perkins, Alexzandrew L, PA-C  sulfamethoxazole-trimethoprim (BACTRIM DS,SEPTRA DS) 800-160 MG tablet Take 1 tablet by mouth  2 (two) times daily. Patient not taking: Reported on 10/19/2016 10/02/16   Deneise Lever, MD  traMADol (ULTRAM) 50 MG tablet Take 1-2 tablets (50-100 mg total) by mouth every 6 (six) hours as needed for moderate pain. 01/26/17   Perkins, Alexzandrew L, PA-C    Inpatient Medications: Scheduled Meds: . albuterol  2.5 mg  Nebulization BID  . docusate sodium  100 mg Oral BID  . enoxaparin (LOVENOX) injection  100 mg Subcutaneous Q12H  . fluticasone  1 spray Each Nare Daily  . fluticasone furoate-vilanterol  1 puff Inhalation Daily  . metoprolol tartrate  10 mg Intravenous Q6H  . Naldemedine Tosylate  0.2 mg Oral Q1200  . sodium phosphate  1 enema Rectal Once   Continuous Infusions: . sodium chloride 75 mL/hr at 02/01/17 1100  . amiodarone 30 mg/hr (02/02/17 0041)  . dilTIAZem HCl-Dextrose 15 mg/hr (02/02/17 0348)  . methocarbamol (ROBAXIN)  IV 500 mg (02/01/17 0437)  . potassium chloride     PRN Meds: acetaminophen **OR** acetaminophen, albuterol, ALPRAZolam, benzonatate, bisacodyl, cyclobenzaprine, diphenhydrAMINE, furosemide, hydrALAZINE, labetalol, LORazepam, menthol-cetylpyridinium **OR** phenol, methocarbamol (ROBAXIN)  IV, metoCLOPramide **OR** metoCLOPramide (REGLAN) injection, morphine, morphine injection, nitroGLYCERIN, ondansetron **OR** ondansetron (ZOFRAN) IV, polyethylene glycol, prochlorperazine, promethazine, sodium phosphate, traMADol  Allergies:    Allergies  Allergen Reactions  . Bee Venom Anaphylaxis  . Codeine Shortness Of Breath and Rash  . Doxycycline Shortness Of Breath and Rash  . Ivp Dye [Iodinated Diagnostic Agents] Shortness Of Breath    Lost consciousness/difficulty breathing Omni - Paque Contrast   . Levaquin [Levofloxacin] Hives and Shortness Of Breath  . Oxycodone Hcl Shortness Of Breath, Swelling and Rash  . Stadol [Butorphanol] Shortness Of Breath  . Succinylcholine Chloride Shortness Of Breath, Nausea Only, Rash and Other (See Comments)    Difficulty  breathing   . Atorvastatin     myalgias  . Bystolic [Nebivolol Hcl] Other (See Comments)    Nightmares, flashbacks (PTSD)  . Iohexol      Desc: hives,neck and torso erythemia   . Methocarbamol Nausea Only    rash  . Pentazocine Lactate     rash  . Dilaudid [Hydromorphone Hcl] Nausea Only and Rash    Aggressive   . Synvisc [Hylan G-F 20] Hives and Rash    Social History:   Social History   Social History  . Marital status: Married    Spouse name: N/A  . Number of children: 3  . Years of education: N/A   Occupational History  . Disabled    Social History Main Topics  . Smoking status: Never Smoker  . Smokeless tobacco: Former Systems developer  . Alcohol use Yes     Comment: occasional beer   . Drug use: No  . Sexual activity: Yes   Other Topics Concern  . Not on file   Social History Narrative   He is a father of 25, grandfather 41.    One of his sons was killed during the war in [place]. Grandson killed bike accident 2017.   He was exposed to [employer].   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   Not very physically active due to extreme disability/debilitation from osteoarthritis and peripheral neuropathy.    Family History:    Family History  Problem Relation Age of Onset  . Stroke Mother   . Heart disease Mother   . Heart attack Mother   . Heart disease Father   . Heart attack Father   . Heart attack Sister   . Heart attack Brother   . Hypertension Brother   . Heart attack Son   . Stroke Son   . Hypertension Brother   . Sleep apnea Son   . Non-Hodgkin's lymphoma Daughter      ROS:  Please see the history of  present illness.  ROS  All other ROS reviewed and negative.     Physical Exam/Data:   Vitals:   02/02/17 0300 02/02/17 0321 02/02/17 0400 02/02/17 0600  BP: (!) 156/75  (!) 153/74 (!) 145/76  Pulse:      Resp: (!) 23  (!) 24 (!) 22  Temp:  97.8 F (36.6 C)    TempSrc:  Oral    SpO2:  96%  96% 96%  Weight:      Height:        Intake/Output Summary (Last 24 hours) at 02/02/17 0750 Last data filed at 02/02/17 0600  Gross per 24 hour  Intake          2476.44 ml  Output             3150 ml  Net          -673.56 ml   Filed Weights   01/25/17 0951 02/01/17 1330  Weight: 231 lb (104.8 kg) 222 lb 7.1 oz (100.9 kg)   Body mass index is 29.35 kg/m.  General:  Well nourished, well developed, in no acute distress  HEENT: normal Lymph: no adenopathy Neck: no JVD Endocrine:  No thryomegaly Vascular: No carotid bruits; FA pulses 2+ bilaterally without bruits  Cardiac:  normal S1, S2; RRR; no murmur   Lungs:  clear to auscultation bilaterally, no wheezing, rhonchi or rales  Abd: not tender distended with tympany NG tube in place  Ext: no edema Musculoskeletal:  No deformities, BUE and BLE strength normal and equal Skin: warm and dry  Neuro:  CNs 2-12 intact, no focal abnormalities noted Psych:  Normal affect  Post Left TKR   EKG:  The EKG was personally reviewed and demonstrates:  Afib no acute ST changes  Telemetry:  Telemetry was personally reviewed and demonstrates:  AFib rates 100-120   Relevant CV Studies: Echo 2017 normal EF  Cath 10/22/16 reviewed films no CAD EDP elevated EF normal   Laboratory Data:  Chemistry  Recent Labs Lab 01/31/17 0504 02/01/17 0424 02/02/17 0512  NA 135 137 139  K 3.2* 4.0 3.1*  CL 98* 101 103  CO2 23 25 26   GLUCOSE 136* 124* 167*  BUN 47* 26* 17  CREATININE 1.07 0.77 0.63  CALCIUM 8.6* 8.8* 8.8*  GFRNONAA >60 >60 >60  GFRAA >60 >60 >60  ANIONGAP 14 11 10      Recent Labs Lab 01/29/17 0741 01/30/17 0555 01/31/17 0504  PROT 8.3* 7.4 7.2  ALBUMIN 3.7 3.2* 3.2*  AST 31 35 23  ALT 26 34 29  ALKPHOS 77 79 74  BILITOT 2.6* 2.2* 2.0*   Hematology  Recent Labs Lab 01/28/17 0541 02/01/17 0424 02/02/17 0512  WBC 12.7* 15.3* 17.8*  RBC 4.19* 3.72* 3.88*  HGB 12.5* 11.2* 11.3*  HCT 36.6* 32.2* 33.1*  MCV 87.4  86.6 85.3  MCH 29.8 30.1 29.1  MCHC 34.2 34.8 34.1  RDW 13.2 12.9 13.0  PLT 318 418* 530*   Cardiac Enzymes  Recent Labs Lab 02/01/17 1316 02/01/17 1734 02/01/17 2316 02/02/17 0512  TROPONINI 0.07* 0.22* 0.27* 0.22*   No results for input(s): TROPIPOC in the last 168 hours.  BNPNo results for input(s): BNP, PROBNP in the last 168 hours.  DDimer No results for input(s): DDIMER in the last 168 hours.  Radiology/Studies:  Dg Chest Port 1 View  Result Date: 02/01/2017 CLINICAL DATA:  Chest pain intermittently for the past year, worse today. EXAM: PORTABLE CHEST 1 VIEW COMPARISON:  01/31/2017. FINDINGS:  Normal sized heart. Clear lungs. Nasogastric tube tip in the proximal stomach. Stable surgical clips at the gastroesophageal junction. IMPRESSION: No acute abnormality. Electronically Signed   By: Claudie Revering M.D.   On: 02/01/2017 16:26   Dg Chest Port 1 View  Result Date: 01/31/2017 CLINICAL DATA:  Shortness of breath and hypoxia.  COPD. EXAM: PORTABLE CHEST 1 VIEW COMPARISON:  01/30/2017 FINDINGS: Nasogastric tube extends into the stomach. The lungs appear clear. Upper normal heart size. Atherosclerotic calcification of the aortic arch. No blunting of the costophrenic angles. IMPRESSION: 1. Nasogastric tube tip:  Stomach. 2.  Aortic Atherosclerosis (ICD10-I70.0). Electronically Signed   By: Van Clines M.D.   On: 01/31/2017 13:18   Dg Chest Port 1 View  Result Date: 01/30/2017 CLINICAL DATA:  NG tube placement. EXAM: PORTABLE CHEST 1 VIEW COMPARISON:  One-view chest x-ray 01/29/2017. FINDINGS: The heart size is exaggerate by low lung volumes. Pulmonary vascular congestion has increased. No focal airspace disease is present. There are no effusions. The tip of the NG tube is at the thoracic inlet. IMPRESSION: 1. The tip of the NG tube is at the thoracic inlet. 2. Increased pulmonary vascular congestion. Electronically Signed   By: San Morelle M.D.   On: 01/30/2017 08:39   Dg  Chest Port 1 View  Result Date: 01/29/2017 CLINICAL DATA:  Nausea and vomiting. EXAM: PORTABLE CHEST 1 VIEW COMPARISON:  07/31/2016. FINDINGS: Mediastinum and hilar structures normal. No focal infiltrate. No pleural effusion or pneumothorax. IMPRESSION: No acute cardiopulmonary disease. Electronically Signed   By: Marcello Moores  Register   On: 01/29/2017 09:05   Dg Abd Portable 1v  Result Date: 01/30/2017 CLINICAL DATA:  NG tube verification. EXAM: PORTABLE ABDOMEN - 1 VIEW COMPARISON:  Abdominal radiographs 01/29/2017. FINDINGS: The NG tube is not visible in the lower chest or abdomen. Multiple dilated loops of small bowel are noted, worse on the left. Dilation measures up to 6.1 cm. IMPRESSION: 1. Dilated loops of small bowel without evidence for obstruction. Findings are most compatible with ileus. 2. NG tube is not visualized in the lower chest or abdomen. Electronically Signed   By: San Morelle M.D.   On: 01/30/2017 08:38   Dg Abd Portable 2v  Result Date: 01/29/2017 CLINICAL DATA:  Ileus. EXAM: PORTABLE ABDOMEN - 2 VIEW COMPARISON:  07/31/2016 FINDINGS: Dilated loops of bowel gas throughout the abdomen. There is evidence for gas in both the small and large bowel. No evidence for free air on the decubitus image. Surgical clips in the upper abdomen. IMPRESSION: Gas-filled loops of bowel throughout the abdomen. Findings are suggestive for an ileus. Electronically Signed   By: Markus Daft M.D.   On: 01/29/2017 08:55   Dg Addison Bailey G Tube Plc W/fl W/rad  Result Date: 01/29/2017 CLINICAL DATA:  Abdominal distention. EXAM: NASO G TUBE PLACEMENT WITH FL AND WITH RAD FLUOROSCOPY TIME:  Fluoroscopy Time:  0 minutes 50 seconds Radiation Exposure Index (if provided by the fluoroscopic device): 17.3 mGy COMPARISON:  None. FINDINGS: I inserted a nasogastric tube through the right side of the nose and advanced the tube into the stomach without difficulty using fluoroscopic monitoring. 1900 cc of fluid was suctioned  from the NG tube after placement. IMPRESSION: NG tube placed in the stomach without difficulty. Mr. Radney tolerated the procedure well. Electronically Signed   By: Lorriane Shire M.D.   On: 01/29/2017 12:31    Assessment and Plan:   1. PAF:  Continue amiodarone and diltiazem Add iv digoxin can transition  to oral meds when ileus resolved and taking PO 2. Anticoagulation:  Needs to be on heparin or DOAC Xarelto 20 mg daily or eliquis 5 bid when ok with ortho and primary service they can start CHA2Vasc 3  3. Elevated troponin : not significant recent cath with no epicardial CAD 4. Carotid:  Known right ICA stenosis needs f/u duplex 40-59% can arrange as outpatient with Dr [name]  5. Ortho:  Left knee incision looks good was ambulating yesterday continue PT/OT 6. Ileius:  Still tympanitic but passing gas NG tube per primary service     Signed, Jenkins Rouge, MD  02/02/2017 7:50 AM

## 2017-02-02 NOTE — Progress Notes (Signed)
PT Cancellation Note  Patient Details Name: [name] MRN: [id number] DOB: [birth year]   Cancelled Treatment:    Reason Eval/Treat Not Completed: Medical issues which prohibited therapy, per RN , on cardiazem drip. Will continue PT when medically stable.    Marcelino Freestone PT [phone]  02/02/2017, 12:22 PM

## 2017-02-02 NOTE — Progress Notes (Signed)
  Echocardiogram 2D Echocardiogram has been performed.  [name] 02/02/2017, 8:59 AM

## 2017-02-02 NOTE — Progress Notes (Signed)
Subjective: 8 Days Post-Op Procedure(s) (LRB): LEFT TOTAL KNEE ARTHROPLASTY (Left) Patient reports pain as mild and moderate.   Patient seen in rounds for Dr. Wynelle Link. Patient is in the ICU/Stepdown at this time. Events of the weekend reviewed.  Developed bowel issues by the end of last week requiring medical consult - Ileus. Patient states that he had two bowel movements on Saturday but then had to have the NGT placed back in. Unfortunately, developed A.fib with RVR yesterday.  On Cardizem and Amiodarone at this time. He also had some blisters develop on the distal incision over the weekend.  Objective: Vital signs in last 24 hours: Temp:  [97.6 F (36.4 C)-98.4 F (36.9 C)] 97.8 F (36.6 C) (09/04 0321) Pulse Rate:  [128-164] 128 (09/03 2127) Resp:  [12-30] 22 (09/04 0600) BP: (126-194)/(71-152) 145/76 (09/04 0600) SpO2:  [94 %-100 %] 96 % (09/04 0600) Weight:  [100.9 kg (222 lb 7.1 oz)] 100.9 kg (222 lb 7.1 oz) (09/03 1330)  Intake/Output from previous day:  Intake/Output Summary (Last 24 hours) at 02/02/17 0823 Last data filed at 02/02/17 0600  Gross per 24 hour  Intake          2476.44 ml  Output             2000 ml  Net           476.44 ml    Intake/Output this shift: No intake/output data recorded.  Labs:  Recent Labs  02/01/17 0424 02/02/17 0512  HGB 11.2* 11.3*    Recent Labs  02/01/17 0424 02/02/17 0512  WBC 15.3* 17.8*  RBC 3.72* 3.88*  HCT 32.2* 33.1*  PLT 418* 530*    Recent Labs  02/01/17 0424 02/02/17 0512  NA 137 139  K 4.0 3.1*  CL 101 103  CO2 25 26  BUN 26* 17  CREATININE 0.77 0.63  GLUCOSE 124* 167*  CALCIUM 8.8* 8.8*   No results for input(s): LABPT, INR in the last 72 hours.  EXAM General - Patient is Alert and Appropriate Extremity - Neurovascular intact Sensation intact distally Intact pulses distally Dressing/Incision - clean, dry, no drainage, tape blisters noted on the distal medial incision. Motor Function -  intact, moving foot and toes well on exam.   Past Medical History:  Diagnosis Date  . Anemia   . Asthma   . Back pain, chronic, followed at pain clinic 11/25/2011  . Carotid artery stenosis 09/06/2014  . Chest pain with low risk for cardiac etiology 10/2011   Non-obstructive CAD by Cath; negative Lexiscan in 08/2011  . Clotting disorder (HCC)    LLE DVT (also small vessel)  . Colon polyps   . COPD (chronic obstructive pulmonary disease) ([place])   . Dementia    Mild  . Depression   . Dyslipidemia (high LDL; low HDL)    statin intolerant; on fibrate  . Edema leg    Wears compression stockings; 08/2012 dopplers - no DVT or thrombophlebitis; mild R Popliteal V reflux - no VNUS ablation candidate  . Fainting   . Family history of acute myocardial infarction. and premature CAD 11/25/2011  . GERD (gastroesophageal reflux disease)   . Hearing loss   . History of diabetes mellitus   . Hypertension    very labile  . Mild aortic stenosis by prior echocardiogram 03/2012   moderate sclerosis  . Nephrolithiasis   . Osteoarthritis   . Other idiopathic peripheral autonomic neuropathy    Agent orange  . PTSD (post-traumatic stress  disorder) 07/24/2015   per patient approach from foot of bed to awake; do not apply any contricting pressure, also avoid approaching from behind with any loud noises   . Seizure ([place])   . Small bowel obstruction ([place]) 1990s, 2001, 2015   s/p multiple bowel surgeries; from war wounds  . Stroke ([place])   . Weakness     Assessment/Plan: 8 Days Post-Op Procedure(s) (LRB): LEFT TOTAL KNEE ARTHROPLASTY (Left) Principal Problem:   OA (osteoarthritis) of knee Active Problems:   Essential hypertension   Mild aortic stenosis by prior echocardiogram   H/O major abdominal surgery   COPD mixed type (HCC)  Estimated body mass index is 29.35 kg/m as calculated from the following:   Height as of this encounter: 6\' 1"  (1.854 m).   Weight as of this encounter: 100.9 kg (222 lb  7.1 oz).  Greatly appreciated Medical and Cardiology assistance with Mr. Uhlig. Doing SLR's so does not need the Knee Immobilizer at this time. Incision okay and blisters noted.  Can go without dressing on knee unless blisters rupture and drainage occurs. Pillow may be used under the foot and ankle but NO PILLOW behind the knee. Resume therapy once okay from medical/cardiac standpoint to resume therapy  DVT Prophylaxis - Lovenox Weight-Bearing as tolerated to left leg  Arlee Muslim, PA-C Orthopaedic Surgery 02/02/2017, 8:23 AM

## 2017-02-02 NOTE — Progress Notes (Signed)
PROGRESS NOTE    [name]  ZOX:[id number] DOB: May 14, 1951 DOA: 01/25/2017 PCP: Remi Haggard, FNP    Brief Narrative:  66 year old male with history of type 2 diabetes mellitus, hyperlipidemia, hypertension, COPD, CAD, who was admitted on orthopedic surgery service with left knee osteoarthritis and underwent left total knee arthroplasty on 01/25/2017.  Patient has been progressively nauseous and has been complaining of abdominal distention over the last 24 hours, this was associated with orthostatic hypotension requiring several IV fluid boluses.  He denies any chest pain or palpitations.  He denies any fevers or chills.  He does have a cough with minimal sputum production, and that is somewhat chronic.  He reports he has not had a bowel movement in several days, however he is passing gas.  He has a history of intermittent chest pains for which she was admitted on cardiology service in May 2018, underwent a cardiac catheterization which was negative for significant coronary artery disease.  He also has COPD followed by Dr. Annamaria Boots with pulmonology (patient never smoked).  He does have history of extensive intra-abdominal surgery (appendicectomy, Bilroth II), and has had an episode of SBO in 2015.  His surgeon is Dr. Harlow Asa.   Assessment & Plan:   Principal Problem:   OA (osteoarthritis) of knee Active Problems:   Essential hypertension   Mild aortic stenosis by prior echocardiogram   H/O major abdominal surgery   COPD mixed type (HCC)  Abdominal pain, nausea vomiting secondary to post-op ileus -dilated loops of small bowel on abd xray -NG in place with continued output -Continue with bowel rest, anti-emetics, hydration as tolerated -Pos BM 9/2 -No evidence of obstruction noted on recent imaging -Pt passing flatus. Pos BS. Will clamp NG and start trials of ice chips. If tolerates ice chips, then would start clears. If tolerates clears, then would d/c NG and transition to PO  meds  COPD -No audible wheezing on exam. -continue Brio elliptical, continue albuterol -Remains stable at present  Hypertension -Given orthostatic hypotension, concern for dehydration, discontinued ARB/diuretic combination.   -Continued on IV lopressor, dose increased to 10mg  IV q6hrs  ?CAD with prior history of chest pain -recent cardiac catheterization 3 months ago negative for significant coronary artery disease. -Most recent 2D echo October 2017 showed normal EF 60-65%, mild aortic stenosis and mild regurgitation -Trop overnight peaked to 0.27 in the setting of RVR, now trending down. Denies chest pain. Likely troponin leak secondary to uncontrolled HR  Osteoarthritis -pt is s/p L knee arthoplasty -Per Orthopedic Surgery  ARF -Labs reviewed. Cr has normalized -Continuing on IVF -Stable   Afib with RVR -New diagnosis, reviewed on EKG -Electrolytes w/in normal limits. Recent TSH within normal limit -failed trial of PRN labetalol IV -Continued on scheduled IV beta blocker, cardizem gtt, amiodarone gtt -CHADS-VASc of 4. Discussed with Orthopedic Surgery. OK to start full dose anticoagulation. Continue therapeutic lovenox. Plan NOAC when pt able to tolerate PO. -Appreciate input by Cardiology. Rec to start digoxin -2d echo reviewed. Findings of moderate AS, normal LVEF, no wall motion abnormality  DVT prophylaxis: Lovenox subQ Code Status: Full Family Communication: Pt in room, family not in room Disposition Plan: Uncertain at this time  Consultants:   Orthopedic Surgery  Cardiology  Procedures:     Antimicrobials: Anti-infectives    Start     Dose/Rate Route Frequency Ordered Stop   01/25/17 1800  ceFAZolin (ANCEF) IVPB 2g/100 mL premix     2 g 200 mL/hr over 30 Minutes Intravenous  Every 6 hours 01/25/17 1510 01/25/17 2359   01/25/17 1003  ceFAZolin (ANCEF) 2-4 GM/100ML-% IVPB    Comments:  [name]   : cabinet override      01/25/17 1003 01/25/17  1130   01/25/17 0933  ceFAZolin (ANCEF) IVPB 2g/100 mL premix     2 g 200 mL/hr over 30 Minutes Intravenous On call to O.R. 01/25/17 0933 01/25/17 1200      Subjective: Complaining of palpitations, chest tightness, subjective sob  Objective: Vitals:   02/02/17 0300 02/02/17 0321 02/02/17 0400 02/02/17 0600  BP: (!) 156/75  (!) 153/74 (!) 145/76  Pulse:      Resp: (!) 23  (!) 24 (!) 22  Temp:  97.8 F (36.6 C)    TempSrc:  Oral    SpO2: 96%  96% 96%  Weight:      Height:        Intake/Output Summary (Last 24 hours) at 02/02/17 0817 Last data filed at 02/02/17 0600  Gross per 24 hour  Intake          2476.44 ml  Output             2000 ml  Net           476.44 ml   Filed Weights   01/25/17 0951 02/01/17 1330  Weight: 104.8 kg (231 lb) 100.9 kg (222 lb 7.1 oz)    Examination: General exam: Awake, laying in bed, in nad Respiratory system: Normal respiratory effort, no wheezing Cardiovascular system: irregularly irregular, tachycardic Gastrointestinal system: Soft, nondistended, positive BS Central nervous system: CN2-12 grossly intact, strength intact Extremities: Perfused, no clubbing Skin: Normal skin turgor, no notable skin lesions seen Psychiatry: Mood normal // no visual hallucinations   Data Reviewed: I have personally reviewed following labs and imaging studies  CBC:  Recent Labs Lab 01/27/17 0459 01/28/17 0541 02/01/17 0424 02/02/17 0512  WBC 13.7* 12.7* 15.3* 17.8*  HGB 11.7* 12.5* 11.2* 11.3*  HCT 34.7* 36.6* 32.2* 33.1*  MCV 86.5 87.4 86.6 85.3  PLT 273 318 418* 160*   Basic Metabolic Panel:  Recent Labs Lab 01/29/17 0741 01/30/17 0555 01/31/17 0504 02/01/17 0424 02/01/17 1050 02/02/17 0512  NA 130* 131* 135 137  --  139  K 3.9 4.8 3.2* 4.0  --  3.1*  CL 89* 94* 98* 101  --  103  CO2 26 22 23 25   --  26  GLUCOSE 130* 144* 136* 124*  --  167*  BUN 42* 72* 47* 26*  --  17  CREATININE 1.83* 3.09* 1.07 0.77  --  0.63  CALCIUM 9.3 8.5*  8.6* 8.8*  --  8.8*  MG  --   --   --   --  2.5*  --    GFR: Estimated Creatinine Clearance: 113.4 mL/min (by C-G formula based on SCr of 0.63 mg/dL). Liver Function Tests:  Recent Labs Lab 01/29/17 0741 01/30/17 0555 01/31/17 0504  AST 31 35 23  ALT 26 34 29  ALKPHOS 77 79 74  BILITOT 2.6* 2.2* 2.0*  PROT 8.3* 7.4 7.2  ALBUMIN 3.7 3.2* 3.2*   No results for input(s): LIPASE, AMYLASE in the last 168 hours. No results for input(s): AMMONIA in the last 168 hours. Coagulation Profile: No results for input(s): INR, PROTIME in the last 168 hours. Cardiac Enzymes:  Recent Labs Lab 02/01/17 1316 02/01/17 1734 02/01/17 2316 02/02/17 0512  TROPONINI 0.07* 0.22* 0.27* 0.22*   BNP (last 3 results) No results for  input(s): PROBNP in the last 8760 hours. HbA1C: No results for input(s): HGBA1C in the last 72 hours. CBG:  Recent Labs Lab 01/27/17 2127 01/29/17 0812 01/29/17 1211 01/29/17 1652 01/29/17 2120  GLUCAP 101* 128* 141* 162* 157*   Lipid Profile: No results for input(s): CHOL, HDL, LDLCALC, TRIG, CHOLHDL, LDLDIRECT in the last 72 hours. Thyroid Function Tests: No results for input(s): TSH, T4TOTAL, FREET4, T3FREE, THYROIDAB in the last 72 hours. Anemia Panel: No results for input(s): VITAMINB12, FOLATE, FERRITIN, TIBC, IRON, RETICCTPCT in the last 72 hours. Sepsis Labs: No results for input(s): PROCALCITON, LATICACIDVEN in the last 168 hours.  Recent Results (from the past 240 hour(s))  Surgical PCR screen     Status: None   Collection Time: 01/25/17 10:26 AM  Result Value Ref Range Status   MRSA, PCR NEGATIVE NEGATIVE Final   Staphylococcus aureus NEGATIVE NEGATIVE Final    Comment:        The Xpert SA Assay (FDA approved for NASAL specimens in patients over 61 years of age), is one component of a comprehensive surveillance program.  Test performance has been validated by [facility] for patients greater than or equal to 5 year old. It is not  intended to diagnose infection nor to guide or monitor treatment. DELTA CHECK NOTED      Radiology Studies: Dg Chest Port 1 View  Result Date: 02/01/2017 CLINICAL DATA:  Chest pain intermittently for the past year, worse today. EXAM: PORTABLE CHEST 1 VIEW COMPARISON:  01/31/2017. FINDINGS: Normal sized heart. Clear lungs. Nasogastric tube tip in the proximal stomach. Stable surgical clips at the gastroesophageal junction. IMPRESSION: No acute abnormality. Electronically Signed   By: Claudie Revering M.D.   On: 02/01/2017 16:26   Dg Chest Port 1 View  Result Date: 01/31/2017 CLINICAL DATA:  Shortness of breath and hypoxia.  COPD. EXAM: PORTABLE CHEST 1 VIEW COMPARISON:  01/30/2017 FINDINGS: Nasogastric tube extends into the stomach. The lungs appear clear. Upper normal heart size. Atherosclerotic calcification of the aortic arch. No blunting of the costophrenic angles. IMPRESSION: 1. Nasogastric tube tip:  Stomach. 2.  Aortic Atherosclerosis (ICD10-I70.0). Electronically Signed   By: Van Clines M.D.   On: 01/31/2017 13:18    Scheduled Meds: . albuterol  2.5 mg Nebulization BID  . digoxin  0.25 mg Intravenous Q4H  . [START ON 02/03/2017] digoxin  0.25 mg Intravenous Daily  . docusate sodium  100 mg Oral BID  . enoxaparin (LOVENOX) injection  100 mg Subcutaneous Q12H  . fluticasone  1 spray Each Nare Daily  . fluticasone furoate-vilanterol  1 puff Inhalation Daily  . metoprolol tartrate  10 mg Intravenous Q6H  . Naldemedine Tosylate  0.2 mg Oral Q1200  . sodium phosphate  1 enema Rectal Once   Continuous Infusions: . sodium chloride 75 mL/hr at 02/01/17 1100  . amiodarone 30 mg/hr (02/02/17 0041)  . dilTIAZem HCl-Dextrose 15 mg/hr (02/02/17 0348)  . methocarbamol (ROBAXIN)  IV 500 mg (02/01/17 0437)  . potassium chloride       LOS: 8 days   Cherelle Midkiff, Orpah Melter, MD [facility] Pager [phone]  If 7PM-7AM, please contact night-coverage [email or web address] Password  [email or web address] 02/02/2017, 8:17 AM

## 2017-02-02 NOTE — Progress Notes (Signed)
Patient converted to NSR around 1900. Cardizem gtt weaned to 5mg /hr. Amiodarone gtt @ 30mg /hr.HR in the 80's. MD notified. Received orders to start PO Cardizem.

## 2017-02-03 ENCOUNTER — Inpatient Hospital Stay (HOSPITAL_COMMUNITY): Payer: Medicare Other

## 2017-02-03 DIAGNOSIS — I350 Nonrheumatic aortic (valve) stenosis: Secondary | ICD-10-CM

## 2017-02-03 DIAGNOSIS — K560 Paralytic ileus: Secondary | ICD-10-CM

## 2017-02-03 DIAGNOSIS — R079 Chest pain, unspecified: Secondary | ICD-10-CM

## 2017-02-03 LAB — BASIC METABOLIC PANEL
ANION GAP: 9 (ref 5–15)
BUN: 15 mg/dL (ref 6–20)
CALCIUM: 8.9 mg/dL (ref 8.9–10.3)
CO2: 25 mmol/L (ref 22–32)
Chloride: 103 mmol/L (ref 101–111)
Creatinine, Ser: 0.64 mg/dL (ref 0.61–1.24)
GFR calc Af Amer: >60 mL/min (ref >60)
GFR calc non Af Amer: >60 mL/min (ref >60)
GLUCOSE: 142 mg/dL — AB (ref 65–99)
Potassium: 3.5 mmol/L (ref 3.5–5.1)
Sodium: 137 mmol/L (ref 135–145)

## 2017-02-03 MED ORDER — MORPHINE SULFATE (PF) 2 MG/ML IV SOLN
2.0000 mg | INTRAVENOUS | Status: DC | PRN
  Administered 2017-02-03 (×2): 2 mg via INTRAVENOUS
  Administered 2017-02-03: 3 mg via INTRAVENOUS
  Administered 2017-02-04 (×3): 2 mg via INTRAVENOUS
  Administered 2017-02-04: 3 mg via INTRAVENOUS
  Administered 2017-02-04 (×3): 2 mg via INTRAVENOUS
  Administered 2017-02-05: 3 mg via INTRAVENOUS
  Administered 2017-02-05: 2 mg via INTRAVENOUS
  Administered 2017-02-05 (×2): 3 mg via INTRAVENOUS
  Administered 2017-02-05: 2 mg via INTRAVENOUS
  Administered 2017-02-06 – 2017-02-07 (×4): 3 mg via INTRAVENOUS
  Administered 2017-02-07 (×2): 2 mg via INTRAVENOUS
  Administered 2017-02-08 – 2017-02-09 (×4): 3 mg via INTRAVENOUS
  Filled 2017-02-03: qty 1
  Filled 2017-02-03: qty 2
  Filled 2017-02-03: qty 1
  Filled 2017-02-03: qty 2
  Filled 2017-02-03: qty 1
  Filled 2017-02-03 (×5): qty 2
  Filled 2017-02-03: qty 1
  Filled 2017-02-03 (×4): qty 2
  Filled 2017-02-03 (×2): qty 1
  Filled 2017-02-03 (×2): qty 2
  Filled 2017-02-03 (×6): qty 1

## 2017-02-03 MED ORDER — PROMETHAZINE HCL 25 MG/ML IJ SOLN
6.2500 mg | Freq: Four times a day (QID) | INTRAMUSCULAR | Status: DC | PRN
  Administered 2017-02-03 – 2017-02-07 (×4): 6.25 mg via INTRAVENOUS
  Filled 2017-02-03 (×4): qty 1

## 2017-02-03 MED ORDER — POTASSIUM CHLORIDE 10 MEQ/100ML IV SOLN
10.0000 meq | INTRAVENOUS | Status: AC
  Administered 2017-02-03 (×2): 10 meq via INTRAVENOUS
  Filled 2017-02-03 (×2): qty 100

## 2017-02-03 MED ORDER — HYDRALAZINE HCL 20 MG/ML IJ SOLN
10.0000 mg | INTRAMUSCULAR | Status: DC | PRN
  Administered 2017-02-03 – 2017-02-07 (×7): 10 mg via INTRAVENOUS
  Filled 2017-02-03 (×2): qty 1
  Filled 2017-02-03: qty 0.5
  Filled 2017-02-03 (×2): qty 1
  Filled 2017-02-03: qty 0.5
  Filled 2017-02-03 (×2): qty 1

## 2017-02-03 MED ORDER — BISACODYL 10 MG RE SUPP
10.0000 mg | Freq: Every day | RECTAL | Status: DC
  Administered 2017-02-04 – 2017-02-08 (×3): 10 mg via RECTAL
  Filled 2017-02-03 (×6): qty 1

## 2017-02-03 NOTE — Progress Notes (Signed)
PT Cancellation Note  Patient Details Name: [name] MRN: [id number] DOB: Oct 01, 1950   Cancelled Treatment:    Reason Eval/Treat Not Completed: Medical issues which prohibited therapy, per RN, too nauseated to participate. Will check back later as schedule allows and patient able,   Claretha Cooper 02/03/2017, 8:43 AM Tresa Endo PT [phone]

## 2017-02-03 NOTE — Progress Notes (Signed)
PROGRESS NOTE  [name]:[id number] DOB: 06/14/1950 DOA: 01/25/2017 PCP: Remi Haggard, FNP   LOS: 9 days   Brief Narrative / Interim history: 66 year old male with history of type 2 diabetes mellitus, hyperlipidemia, hypertension, COPD, CAD, who was admitted on orthopedic surgery service with left knee osteoarthritis and underwent left total knee arthroplasty on 01/25/2017. Patient has been progressively nauseous and has been complaining of abdominal distention over the last 24 hours, this was associated with orthostatic hypotension requiring several IV fluid boluses. He denies any chest pain or palpitations. He denies any fevers or chills. He does have a cough with minimal sputum production, and that is somewhat chronic. He reports he has not had a bowel movement in several days, however he is passing gas. He has a history of intermittent chest pains for which she was admitted on cardiology service in May 2018, underwent a cardiac catheterization which was negative for significant coronary artery disease. He also has COPD followed by Dr. Annamaria Boots with pulmonology (patient never smoked). He does have history of extensive intra-abdominal surgery (appendicectomy, Bilroth II), and has had an episode of SBO in 2015. His surgeon is Dr. Harlow Asa.    Assessment & Plan: Principal Problem:   Adynamic ileus ([place]) Active Problems:   Essential hypertension   OA (osteoarthritis) of knee   Mild aortic stenosis by prior echocardiogram   H/O major abdominal surgery   COPD mixed type (HCC)   Abdominal pain, nausea vomiting secondary to post-op ileus -NG tube was placed on 8/31, patient improved and his diet was advanced, however on 9/5 he has been having worsening nausea as well as abdominal distention, his NG tube has been clamped since 9/4.  Will reattach tube to wall suctioning.  Abdominal x-ray this morning with persistent ileus.  Consulted general surgery to assist with management,  appreciate input.  COPD -Stable, no wheezing on exam, continue his home medications  Hypertension -Given concern for hypotension last week, his ARB/diuretic was discontinued.  In addition, patient is also n.p.o.  Continue IV Lopressor,  Atrial fibrillation with RVR -patient's CHA2DS2-VASc Score for Stroke Risk is 4 -Patient developed atrial fibrillation with RVR requiring transfer to stepdown.  Cardiology consulted.  -Continue amiodarone infusion, converted back to sinus rhythm overnight and is in sinus 9/5 -Dr. Wyline Copas discussed with orthopedic surgery, okay for full dose anticoagulation.  Currently on therapeutic Lovenox as patient is n.p.o.  Plan to switch to NOAC once able  Chest pain /pressure concern for coronary artery disease -recent cardiac catheterization 3 months ago negative for significant coronary artery disease. -Most recent 2D echo October 2017 showed normal EF 60-65%, mild aortic stenosis and mild regurgitation -Troponin slightly elevated likely in the setting of poorly controlled rates  Osteoarthritis -pt is s/p L knee arthoplasty -Per Orthopedic Surgery  ARF -Labs reviewed. Cr has normalized -Continuing on IVF -Stable    DVT prophylaxis: Lovenox Code Status: Full code Family Communication: no family in the room  Disposition Plan: TBD  Consultants:   Cardiology  General surgery  Procedures:   2D echo Study Conclusions - Left ventricle: The cavity size was normal. Wall thickness was increased in a pattern of moderate LVH. Systolic function was vigorous. The estimated ejection fraction was in the range of 65% to 70%. There was dynamic obstruction at rest, with a peak velocity of 210 cm/sec and a peak gradient of 18 mm Hg. Wall motion was normal; there were no regional wall motion abnormalities. - Aortic valve: Valve mobility was restricted,  worse in the non-coronary cusp. There was moderate stenosis. There was mild regurgitation. Peak velocity (S): 340 cm/s.  Mean gradient (S): 25 mm Hg. Valve area (VTI): 1.34 cm^2. Valve area (Vmax): 1.39 cm^2. Valve area (Vmean): 1.38 cm^2. - Mitral valve: Transvalvular velocity was within the normal range. There was no evidence for stenosis. There was no regurgitation. - Left atrium: The atrium was mildly dilated. - Right ventricle: The cavity size was normal. Wall thickness was normal. Systolic function was normal. - Tricuspid valve: There was trivial regurgitation.  Antimicrobials:  None   Subjective: -Complains of abdominal discomfort, distention as well as nausea.  Denies any vomiting.  He reports occasional palpitations, no chest pain  Objective: Vitals:   02/03/17 0405 02/03/17 0430 02/03/17 0700 02/03/17 0800  BP: (!) 189/84 (!) 164/63 (!) 174/75   Pulse:      Resp: 20 15 (!) 22   Temp:    (!) 97.5 F (36.4 C)  TempSrc:    Oral  SpO2: 97% 96% 97%   Weight:      Height:        Intake/Output Summary (Last 24 hours) at 02/03/17 1059 Last data filed at 02/03/17 0500  Gross per 24 hour  Intake          2955.31 ml  Output             1050 ml  Net          1905.31 ml   Filed Weights   01/25/17 0951 02/01/17 1330 02/03/17 0355  Weight: 104.8 kg (231 lb) 100.9 kg (222 lb 7.1 oz) 104.1 kg (229 lb 8 oz)    Examination:  Vitals:   02/03/17 0405 02/03/17 0430 02/03/17 0700 02/03/17 0800  BP: (!) 189/84 (!) 164/63 (!) 174/75   Pulse:      Resp: 20 15 (!) 22   Temp:    (!) 97.5 F (36.4 C)  TempSrc:    Oral  SpO2: 97% 96% 97%   Weight:      Height:        Constitutional: NAD Eyes:  lids and conjunctivae normal ENMT: Mucous membranes are moist. No oropharyngeal exudates Respiratory: clear to auscultation bilaterally, no wheezing, no crackles. Normal respiratory effort. Cardiovascular: Regular rate and rhythm, no murmurs / rubs / gallops. No LE edema. 2+ pedal pulses.  Abdomen: no tenderness. Bowel sounds decreased.  NG tube in place Skin: no rashes, lesions, ulcers. No  induration Neurologic: CN 2-12 grossly intact. Strength 5/5 in all 4.  Psychiatric: Normal judgment and insight. Alert and oriented x 3. Normal mood.    Data Reviewed: I have independently reviewed following labs and imaging studies  CBC:  Recent Labs Lab 01/28/17 0541 02/01/17 0424 02/02/17 0512  WBC 12.7* 15.3* 17.8*  HGB 12.5* 11.2* 11.3*  HCT 36.6* 32.2* 33.1*  MCV 87.4 86.6 85.3  PLT 318 418* 035*   Basic Metabolic Panel:  Recent Labs Lab 01/30/17 0555 01/31/17 0504 02/01/17 0424 02/01/17 1050 02/02/17 0512 02/03/17 0319  NA 131* 135 137  --  139 137  K 4.8 3.2* 4.0  --  3.1* 3.5  CL 94* 98* 101  --  103 103  CO2 22 23 25   --  26 25  GLUCOSE 144* 136* 124*  --  167* 142*  BUN 72* 47* 26*  --  17 15  CREATININE 3.09* 1.07 0.77  --  0.63 0.64  CALCIUM 8.5* 8.6* 8.8*  --  8.8* 8.9  MG  --   --   --  2.5*  --   --    GFR: Estimated Creatinine Clearance: 115.1 mL/min (by C-G formula based on SCr of 0.64 mg/dL). Liver Function Tests:  Recent Labs Lab 01/29/17 0741 01/30/17 0555 01/31/17 0504  AST 31 35 23  ALT 26 34 29  ALKPHOS 77 79 74  BILITOT 2.6* 2.2* 2.0*  PROT 8.3* 7.4 7.2  ALBUMIN 3.7 3.2* 3.2*   No results for input(s): LIPASE, AMYLASE in the last 168 hours. No results for input(s): AMMONIA in the last 168 hours. Coagulation Profile: No results for input(s): INR, PROTIME in the last 168 hours. Cardiac Enzymes:  Recent Labs Lab 02/01/17 1734 02/01/17 2316 02/02/17 0512 02/02/17 1057 02/02/17 1952  TROPONINI 0.22* 0.27* 0.22* 0.14* 0.14*   BNP (last 3 results) No results for input(s): PROBNP in the last 8760 hours. HbA1C: No results for input(s): HGBA1C in the last 72 hours. CBG:  Recent Labs Lab 01/27/17 2127 01/29/17 0812 01/29/17 1211 01/29/17 1652 01/29/17 2120  GLUCAP 101* 128* 141* 162* 157*   Lipid Profile: No results for input(s): CHOL, HDL, LDLCALC, TRIG, CHOLHDL, LDLDIRECT in the last 72 hours. Thyroid Function  Tests: No results for input(s): TSH, T4TOTAL, FREET4, T3FREE, THYROIDAB in the last 72 hours. Anemia Panel: No results for input(s): VITAMINB12, FOLATE, FERRITIN, TIBC, IRON, RETICCTPCT in the last 72 hours. Urine analysis:    Component Value Date/Time   COLORURINE ORANGE (A) 01/16/2014 1146   APPEARANCEUR TURBID (A) 01/16/2014 1146   LABSPEC 1.031 (H) 01/16/2014 1146   PHURINE 5.5 01/16/2014 1146   GLUCOSEU NEGATIVE 01/16/2014 1146   GLUCOSEU NEGATIVE 02/02/2007 0936   HGBUR NEGATIVE 01/16/2014 1146   BILIRUBINUR negative 12/10/2015 1503   KETONESUR 15 (A) 01/16/2014 1146   PROTEINUR negative 12/10/2015 1503   PROTEINUR NEGATIVE 01/16/2014 1146   UROBILINOGEN 2.0 12/10/2015 1503   UROBILINOGEN 1.0 01/16/2014 1146   NITRITE negative 12/10/2015 1503   NITRITE NEGATIVE 01/16/2014 1146   LEUKOCYTESUR Negative 12/10/2015 1503   Sepsis Labs: Invalid input(s): PROCALCITONIN, LACTICIDVEN  Recent Results (from the past 240 hour(s))  Surgical PCR screen     Status: None   Collection Time: 01/25/17 10:26 AM  Result Value Ref Range Status   MRSA, PCR NEGATIVE NEGATIVE Final   Staphylococcus aureus NEGATIVE NEGATIVE Final    Comment:        The Xpert SA Assay (FDA approved for NASAL specimens in patients over 14 years of age), is one component of a comprehensive surveillance program.  Test performance has been validated by [facility] for patients greater than or equal to 37 year old. It is not intended to diagnose infection nor to guide or monitor treatment. DELTA CHECK NOTED   MRSA PCR Screening     Status: None   Collection Time: 02/01/17  1:46 PM  Result Value Ref Range Status   MRSA by PCR NEGATIVE NEGATIVE Final    Comment:        The GeneXpert MRSA Assay (FDA approved for NASAL specimens only), is one component of a comprehensive MRSA colonization surveillance program. It is not intended to diagnose MRSA infection nor to guide or monitor treatment for MRSA  infections.       Radiology Studies: Dg Chest Port 1 View  Result Date: 02/01/2017 CLINICAL DATA:  Chest pain intermittently for the past year, worse today. EXAM: PORTABLE CHEST 1 VIEW COMPARISON:  01/31/2017. FINDINGS: Normal sized heart. Clear lungs. Nasogastric tube tip in the proximal stomach. Stable surgical clips at the gastroesophageal junction. IMPRESSION:  No acute abnormality. Electronically Signed   By: Claudie Revering M.D.   On: 02/01/2017 16:26   Dg Abd Portable 1v  Result Date: 02/02/2017 CLINICAL DATA:  Ileus. EXAM: PORTABLE ABDOMEN - 1 VIEW COMPARISON:  01/30/2017 FINDINGS: An enteric tube has been placed with tip and side hole projecting over the gastric fundus. Surgical clips are noted in the upper abdomen. Gas-filled dilated loops of small bowel are again seen throughout the abdomen with overall mild improvement of bowel distention compared to the prior study. Gas and stool are present in the colon. The visualized lung bases are clear. No acute osseous abnormality is seen. IMPRESSION: Slight improvement of bowel dilatation suggesting ileus. Electronically Signed   By: Logan Bores M.D.   On: 02/02/2017 19:14   Dg Abd Portable 2v  Result Date: 02/03/2017 CLINICAL DATA:  Ileus.  Recent left total knee arthroplasty. EXAM: PORTABLE ABDOMEN - 2 VIEW COMPARISON:  02/02/2017 abdominal radiographs FINDINGS: Enteric tube terminates in the gastric fundus. Mild bibasilar atelectasis. Surgical clips overlie the upper abdomen bilaterally. Diffuse dilatation of small and large bowel throughout the abdomen with small bowel loops measuring up to 6.2 cm diameter in the left abdomen, not appreciably changed. Moderate stool in the proximal colon. No evidence of pneumatosis or pneumoperitoneum. No radiopaque urolithiasis. IMPRESSION: 1. Enteric tube terminates in the gastric fundus . 2. No appreciable change in diffuse dilatation of small and large bowel loops throughout the abdomen, compatible with adynamic  ileus. Electronically Signed   By: Ilona Sorrel M.D.   On: 02/03/2017 08:30     Scheduled Meds: . albuterol  2.5 mg Nebulization BID  . [START ON 02/04/2017] bisacodyl  10 mg Rectal Daily  . digoxin  0.25 mg Intravenous Daily  . diltiazem  60 mg Oral Q8H  . docusate sodium  100 mg Oral BID  . enoxaparin (LOVENOX) injection  100 mg Subcutaneous Q12H  . fluticasone  1 spray Each Nare Daily  . fluticasone furoate-vilanterol  1 puff Inhalation Daily  . metoprolol tartrate  10 mg Intravenous Q6H  . Naldemedine Tosylate  0.2 mg Oral Q1200  . sodium phosphate  1 enema Rectal Once   Continuous Infusions: . sodium chloride 75 mL/hr at 02/03/17 0510  . amiodarone 30 mg/hr (02/03/17 1005)  . dilTIAZem HCl-Dextrose Stopped (02/03/17 0119)  . methocarbamol (ROBAXIN)  IV Stopped (02/02/17 2142)  . potassium chloride      Marzetta Board, MD, PhD [facility] Pager [phone]  If 7PM-7AM, please contact night-coverage [email or web address] Password [email or web address] 02/03/2017, 10:59 AM

## 2017-02-03 NOTE — Care Management Note (Signed)
Case Management Note  Patient Details  Name: [name] MRN: [id number] Date of Birth: 1951/02/09  Subjective/Objective:                  Continue iv cardizem and amiodarone NSR this am iv digoxin added 02/02/17 Troponin minimal elevation no trend normal EF by echo and no CAD cath 3 months ago Ileus: put NG tube back to suction KUB being done this a S/p left tkr Action/Plan: Date:  February 03, 2017 Chart reviewed for concurrent status and case management needs. Will continue to follow patient progress. Discharge Planning: following for needs Expected discharge date: [id number] Velva Harman, BSN, [employer], [place]  Expected Discharge Date:  01/27/17               Expected Discharge Plan:  [place]  In-House Referral:  NA  Discharge planning Services  CM Consult  Post Acute Care Choice:  Home Health, Durable Medical Equipment Choice offered to:  Patient  DME Arranged:  [place], 3-N-1 DME Agency:  [place]:  PT [facility] Agency:  [place]  Status of Service:  Completed, signed off  If discussed at [place] of Stay Meetings, dates discussed:    Additional Comments:  Leeroy Cha, RN 02/03/2017, 8:16 AM

## 2017-02-03 NOTE — Progress Notes (Signed)
OT Cancellation Note  Patient Details Name: [name] MRN: [id number] DOB: 1950-08-19   Cancelled Treatment:    Reason Eval/Treat Not Completed: Medical issues which prohibited therapy  Pt feeling nauseous per RN. Will check back on pt as schedule allows.  Kari Baars, [place]  Payton Mccallum D 02/03/2017, 8:42 AM

## 2017-02-03 NOTE — Progress Notes (Addendum)
Patient had IV in right wrist that was leaking and possibly infiltrated. IV has been removed. PA Meuth said to place heat pack on site. Pharmacy said that we can alternate with ice as well. Heat pack has been placed at this time. Will continue to monitor and assess and pass along info to [employer].

## 2017-02-03 NOTE — Progress Notes (Signed)
Progress Note  Patient Name: [name] Date of Encounter: 02/03/2017  Primary Cardiologist: harding   Subjective   Feels poorly nausea and vomiting needs NG tube back to suction  Inpatient Medications    Scheduled Meds: . albuterol  2.5 mg Nebulization BID  . digoxin  0.25 mg Intravenous Daily  . diltiazem  60 mg Oral Q8H  . docusate sodium  100 mg Oral BID  . enoxaparin (LOVENOX) injection  100 mg Subcutaneous Q12H  . fluticasone  1 spray Each Nare Daily  . fluticasone furoate-vilanterol  1 puff Inhalation Daily  . metoprolol tartrate  10 mg Intravenous Q6H  . Naldemedine Tosylate  0.2 mg Oral Q1200  . sodium phosphate  1 enema Rectal Once   Continuous Infusions: . sodium chloride 75 mL/hr at 02/03/17 0510  . amiodarone 30 mg/hr (02/02/17 2142)  . dilTIAZem HCl-Dextrose Stopped (02/03/17 0119)  . methocarbamol (ROBAXIN)  IV Stopped (02/02/17 2142)   PRN Meds: acetaminophen **OR** acetaminophen, albuterol, ALPRAZolam, benzonatate, bisacodyl, cyclobenzaprine, diphenhydrAMINE, furosemide, hydrALAZINE, ketorolac, labetalol, LORazepam, home med stored in pyxis, menthol-cetylpyridinium **OR** phenol, methocarbamol (ROBAXIN)  IV, metoCLOPramide **OR** metoCLOPramide (REGLAN) injection, morphine, morphine injection, nitroGLYCERIN, ondansetron **OR** ondansetron (ZOFRAN) IV, polyethylene glycol, prochlorperazine, promethazine, sodium phosphate, traMADol   Vital Signs    Vitals:   02/03/17 0400 02/03/17 0405 02/03/17 0430 02/03/17 0700  BP:  (!) 189/84 (!) 164/63 (!) 174/75  Pulse:      Resp: 17 20 15  (!) 22  Temp:      TempSrc:      SpO2: 98% 97% 96% 97%  Weight:      Height:        Intake/Output Summary (Last 24 hours) at 02/03/17 0748 Last data filed at 02/03/17 0500  Gross per 24 hour  Intake          3275.41 ml  Output             1050 ml  Net          2225.41 ml   Filed Weights   01/25/17 0951 02/01/17 1330 02/03/17 0355  Weight: 231 lb (104.8 kg) 222 lb  7.1 oz (100.9 kg) 229 lb 8 oz (104.1 kg)    Telemetry    NSR 02/03/2017  - Personally Reviewed  ECG    afib no acute ST changes  - Personally Reviewed  Physical Exam  NG tube in place  GEN: uncomfortable and nauseated    Neck: No JVD Cardiac: RRR, SEM  murmurs, rubs, or gallops.  Respiratory: Clear to auscultation bilaterally. GI: Soft, nontender, non-distended  MS: No edema; No deformity. Neuro:  Nonfocal  Psych: Normal affect  Post left TKR   Labs    Chemistry Recent Labs Lab 01/29/17 0741 01/30/17 0555 01/31/17 0504 02/01/17 0424 02/02/17 0512 02/03/17 0319  NA 130* 131* 135 137 139 137  K 3.9 4.8 3.2* 4.0 3.1* 3.5  CL 89* 94* 98* 101 103 103  CO2 26 22 23 25 26 25   GLUCOSE 130* 144* 136* 124* 167* 142*  BUN 42* 72* 47* 26* 17 15  CREATININE 1.83* 3.09* 1.07 0.77 0.63 0.64  CALCIUM 9.3 8.5* 8.6* 8.8* 8.8* 8.9  PROT 8.3* 7.4 7.2  --   --   --   ALBUMIN 3.7 3.2* 3.2*  --   --   --   AST 31 35 23  --   --   --   ALT 26 34 29  --   --   --  ALKPHOS 77 79 74  --   --   --   BILITOT 2.6* 2.2* 2.0*  --   --   --   GFRNONAA 37* 20* >60 >60 >60 >60  GFRAA 43* 23* >60 >60 >60 >60  ANIONGAP 15 15 14 11 10 9      Hematology Recent Labs Lab 01/28/17 0541 02/01/17 0424 02/02/17 0512  WBC 12.7* 15.3* 17.8*  RBC 4.19* 3.72* 3.88*  HGB 12.5* 11.2* 11.3*  HCT 36.6* 32.2* 33.1*  MCV 87.4 86.6 85.3  MCH 29.8 30.1 29.1  MCHC 34.2 34.8 34.1  RDW 13.2 12.9 13.0  PLT 318 418* 530*    Cardiac Enzymes Recent Labs Lab 02/01/17 2316 02/02/17 0512 02/02/17 1057 02/02/17 1952  TROPONINI 0.27* 0.22* 0.14* 0.14*   No results for input(s): TROPIPOC in the last 168 hours.   BNPNo results for input(s): BNP, PROBNP in the last 168 hours.   DDimer No results for input(s): DDIMER in the last 168 hours.   Radiology    Dg Chest Port 1 View  Result Date: 02/01/2017 CLINICAL DATA:  Chest pain intermittently for the past year, worse today. EXAM: PORTABLE CHEST 1 VIEW  COMPARISON:  01/31/2017. FINDINGS: Normal sized heart. Clear lungs. Nasogastric tube tip in the proximal stomach. Stable surgical clips at the gastroesophageal junction. IMPRESSION: No acute abnormality. Electronically Signed   By: Claudie Revering M.D.   On: 02/01/2017 16:26   Dg Abd Portable 1v  Result Date: 02/02/2017 CLINICAL DATA:  Ileus. EXAM: PORTABLE ABDOMEN - 1 VIEW COMPARISON:  01/30/2017 FINDINGS: An enteric tube has been placed with tip and side hole projecting over the gastric fundus. Surgical clips are noted in the upper abdomen. Gas-filled dilated loops of small bowel are again seen throughout the abdomen with overall mild improvement of bowel distention compared to the prior study. Gas and stool are present in the colon. The visualized lung bases are clear. No acute osseous abnormality is seen. IMPRESSION: Slight improvement of bowel dilatation suggesting ileus. Electronically Signed   By: Logan Bores M.D.   On: 02/02/2017 19:14    Cardiac Studies   Echo EF 65% moderate AS mild AR   Patient Profile     66 y.o. male post left TKR complicated by ileus PAF converted on iv cardizem and amiodarone Recent cath with no CAD Echo with normal EF and moderate AS  Assessment & Plan    PaF:  Continue iv cardizem and amiodarone NSR this am iv digoxin added 02/02/17 Troponin minimal elevation no trend normal EF by echo and no CAD cath 3 months ago Ileus: put NG tube back to suction KUB being done this am per primary service Carotid: known right ICA stenosis 40-59% f.u duplex at our office   Jenkins Rouge   Signed, Jenkins Rouge, MD  02/03/2017, 7:48 AM

## 2017-02-03 NOTE — Consult Note (Signed)
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 not progressing. Would also recommend GI consult.  Wellington Hampshire, [facility] Surgery 02/03/2017, 9:26 AM Pager: [phone] Consults: [phone] Mon-Fri 7:00 am-4:30 pm Sat-Sun 7:00 am-11:30 am

## 2017-02-04 ENCOUNTER — Inpatient Hospital Stay (HOSPITAL_COMMUNITY): Payer: Medicare Other

## 2017-02-04 DIAGNOSIS — K560 Paralytic ileus: Secondary | ICD-10-CM

## 2017-02-04 LAB — BASIC METABOLIC PANEL
ANION GAP: 10 (ref 5–15)
BUN: 17 mg/dL (ref 6–20)
CHLORIDE: 104 mmol/L (ref 101–111)
CO2: 24 mmol/L (ref 22–32)
Calcium: 8.9 mg/dL (ref 8.9–10.3)
Creatinine, Ser: 0.63 mg/dL (ref 0.61–1.24)
GFR calc Af Amer: >60 mL/min (ref >60)
GFR calc non Af Amer: >60 mL/min (ref >60)
GLUCOSE: 121 mg/dL — AB (ref 65–99)
Potassium: 3.7 mmol/L (ref 3.5–5.1)
Sodium: 138 mmol/L (ref 135–145)

## 2017-02-04 LAB — CBC
HCT: 38.1 % — ABNORMAL LOW (ref 39.0–52.0)
HEMOGLOBIN: 13.1 g/dL (ref 13.0–17.0)
MCH: 29.8 pg (ref 26.0–34.0)
MCHC: 34.4 g/dL (ref 30.0–36.0)
MCV: 86.6 fL (ref 78.0–100.0)
Platelets: 577 10*3/uL — ABNORMAL HIGH (ref 150–400)
RBC: 4.4 MIL/uL (ref 4.22–5.81)
RDW: 13.5 % (ref 11.5–15.5)
WBC: 23.3 10*3/uL — ABNORMAL HIGH (ref 4.0–10.5)

## 2017-02-04 MED ORDER — VANCOMYCIN HCL IN DEXTROSE 1-5 GM/200ML-% IV SOLN
INTRAVENOUS | Status: AC
  Administered 2017-02-04: 1 g
  Filled 2017-02-04: qty 200

## 2017-02-04 MED ORDER — CEFAZOLIN SODIUM-DEXTROSE 2-4 GM/100ML-% IV SOLN
2.0000 g | Freq: Once | INTRAVENOUS | Status: DC
  Filled 2017-02-04: qty 100

## 2017-02-04 MED ORDER — CEFAZOLIN SODIUM-DEXTROSE 2-4 GM/100ML-% IV SOLN
INTRAVENOUS | Status: AC
  Filled 2017-02-04: qty 100

## 2017-02-04 MED ORDER — POTASSIUM CHLORIDE 10 MEQ/100ML IV SOLN
10.0000 meq | INTRAVENOUS | Status: AC
  Administered 2017-02-04 (×3): 10 meq via INTRAVENOUS
  Filled 2017-02-04 (×3): qty 100

## 2017-02-04 NOTE — Progress Notes (Signed)
Progress Note  Patient Name: [name] Date of Encounter: 02/04/2017  Primary Cardiologist: harding   Subjective   Passing some flatus but not much change in KUB  Inpatient Medications    Scheduled Meds: . albuterol  2.5 mg Nebulization BID  . bisacodyl  10 mg Rectal Daily  . digoxin  0.25 mg Intravenous Daily  . diltiazem  60 mg Oral Q8H  . docusate sodium  100 mg Oral BID  . enoxaparin (LOVENOX) injection  100 mg Subcutaneous Q12H  . fluticasone  1 spray Each Nare Daily  . fluticasone furoate-vilanterol  1 puff Inhalation Daily  . metoprolol tartrate  10 mg Intravenous Q6H  . Naldemedine Tosylate  0.2 mg Oral Q1200  . sodium phosphate  1 enema Rectal Once   Continuous Infusions: . sodium chloride 75 mL/hr at 02/03/17 1600  . amiodarone 30 mg/hr (02/03/17 2017)  . ceFAZolin    . dilTIAZem HCl-Dextrose Stopped (02/03/17 0119)  . methocarbamol (ROBAXIN)  IV Stopped (02/02/17 2142)  . potassium chloride    . vancomycin     PRN Meds: acetaminophen **OR** acetaminophen, albuterol, ALPRAZolam, benzonatate, cyclobenzaprine, diphenhydrAMINE, furosemide, hydrALAZINE, ketorolac, labetalol, LORazepam, home med stored in pyxis, menthol-cetylpyridinium **OR** phenol, methocarbamol (ROBAXIN)  IV, metoCLOPramide **OR** metoCLOPramide (REGLAN) injection, morphine, morphine injection, nitroGLYCERIN, ondansetron **OR** ondansetron (ZOFRAN) IV, polyethylene glycol, promethazine, promethazine, sodium phosphate, traMADol   Vital Signs    Vitals:   02/03/17 2324 02/04/17 0352 02/04/17 0600 02/04/17 0800  BP:   (!) 177/73   Pulse:      Resp:   14   Temp: 98.6 F (37 C) 98.1 F (36.7 C)  98 F (36.7 C)  TempSrc: Oral Oral  Oral  SpO2:   100%   Weight:      Height:        Intake/Output Summary (Last 24 hours) at 02/04/17 0818 Last data filed at 02/04/17 0751  Gross per 24 hour  Intake           1842.2 ml  Output             1325 ml  Net            517.2 ml   Filed  Weights   01/25/17 0951 02/01/17 1330 02/03/17 0355  Weight: 231 lb (104.8 kg) 222 lb 7.1 oz (100.9 kg) 229 lb 8 oz (104.1 kg)    Telemetry    NSR 02/04/2017  - Personally Reviewed  ECG    afib no acute ST changes  - Personally Reviewed  Physical Exam  NG tube in place  GEN: uncomfortable and nauseated    Neck: No JVD Cardiac: RRR, SEM  murmurs, rubs, or gallops.  Respiratory: Clear to auscultation bilaterally. GI: Soft, nontender, non-distended  MS: No edema; No deformity. Neuro:  Nonfocal  Psych: Normal affect  Post left TKR   Labs    Chemistry Recent Labs Lab 01/29/17 0741 01/30/17 0555 01/31/17 0504  02/02/17 0512 02/03/17 0319 02/04/17 0306  NA 130* 131* 135  < > 139 137 138  K 3.9 4.8 3.2*  < > 3.1* 3.5 3.7  CL 89* 94* 98*  < > 103 103 104  CO2 26 22 23   < > 26 25 24   GLUCOSE 130* 144* 136*  < > 167* 142* 121*  BUN 42* 72* 47*  < > 17 15 17   CREATININE 1.83* 3.09* 1.07  < > 0.63 0.64 0.63  CALCIUM 9.3 8.5* 8.6*  < > 8.8* 8.9  8.9  PROT 8.3* 7.4 7.2  --   --   --   --   ALBUMIN 3.7 3.2* 3.2*  --   --   --   --   AST 31 35 23  --   --   --   --   ALT 26 34 29  --   --   --   --   ALKPHOS 77 79 74  --   --   --   --   BILITOT 2.6* 2.2* 2.0*  --   --   --   --   GFRNONAA 37* 20* >60  < > >60 >60 >60  GFRAA 43* 23* >60  < > >60 >60 >60  ANIONGAP 15 15 14   < > 10 9 10   < > = values in this interval not displayed.   Hematology  Recent Labs Lab 02/01/17 0424 02/02/17 0512 02/04/17 0306  WBC 15.3* 17.8* 23.3*  RBC 3.72* 3.88* 4.40  HGB 11.2* 11.3* 13.1  HCT 32.2* 33.1* 38.1*  MCV 86.6 85.3 86.6  MCH 30.1 29.1 29.8  MCHC 34.8 34.1 34.4  RDW 12.9 13.0 13.5  PLT 418* 530* 577*    Cardiac Enzymes  Recent Labs Lab 02/01/17 2316 02/02/17 0512 02/02/17 1057 02/02/17 1952  TROPONINI 0.27* 0.22* 0.14* 0.14*   No results for input(s): TROPIPOC in the last 168 hours.   BNPNo results for input(s): BNP, PROBNP in the last 168 hours.   DDimer No  results for input(s): DDIMER in the last 168 hours.   Radiology    Dg Abd Portable 1v  Result Date: 02/02/2017 CLINICAL DATA:  Ileus. EXAM: PORTABLE ABDOMEN - 1 VIEW COMPARISON:  01/30/2017 FINDINGS: An enteric tube has been placed with tip and side hole projecting over the gastric fundus. Surgical clips are noted in the upper abdomen. Gas-filled dilated loops of small bowel are again seen throughout the abdomen with overall mild improvement of bowel distention compared to the prior study. Gas and stool are present in the colon. The visualized lung bases are clear. No acute osseous abnormality is seen. IMPRESSION: Slight improvement of bowel dilatation suggesting ileus. Electronically Signed   By: Logan Bores M.D.   On: 02/02/2017 19:14   Dg Abd Portable 2v  Result Date: 02/03/2017 CLINICAL DATA:  Ileus.  Recent left total knee arthroplasty. EXAM: PORTABLE ABDOMEN - 2 VIEW COMPARISON:  02/02/2017 abdominal radiographs FINDINGS: Enteric tube terminates in the gastric fundus. Mild bibasilar atelectasis. Surgical clips overlie the upper abdomen bilaterally. Diffuse dilatation of small and large bowel throughout the abdomen with small bowel loops measuring up to 6.2 cm diameter in the left abdomen, not appreciably changed. Moderate stool in the proximal colon. No evidence of pneumatosis or pneumoperitoneum. No radiopaque urolithiasis. IMPRESSION: 1. Enteric tube terminates in the gastric fundus . 2. No appreciable change in diffuse dilatation of small and large bowel loops throughout the abdomen, compatible with adynamic ileus. Electronically Signed   By: Ilona Sorrel M.D.   On: 02/03/2017 08:30    Cardiac Studies   Echo EF 65% moderate AS mild AR   Patient Profile     66 y.o. male post left TKR complicated by ileus PAF converted on iv cardizem and amiodarone Recent cath with no CAD Echo with normal EF and moderate AS  Assessment & Plan    PaF:  Continue iv cardizem and amiodarone NSR this am iv  digoxin added 02/02/17 Troponin minimal elevation no trend normal EF by echo  and no CAD cath 3 months ago Ileus: no change by KUB progress is slow passing some gas   Jenkins Rouge   Signed, Jenkins Rouge, MD  02/04/2017, 8:18 AM
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
## 2017-02-04 NOTE — Progress Notes (Signed)
OT Cancellation Note  Patient Details Name: [name] MRN: [id number] DOB: 01-Dec-1950   Cancelled Treatment:    Reason Eval/Treat Not Completed: Other (comment)  Pt didn't feel up to OT.  Just had pain meds, nausea meds and suppository when I checked. Will return another day.  [name] 02/04/2017, 12:50 PM  Lesle Chris, OTR/L [phone] 02/04/2017

## 2017-02-04 NOTE — Progress Notes (Signed)
Initial Nutrition Assessment  DOCUMENTATION CODES:   Obesity unspecified  INTERVENTION:  - Diet advancement as medically feasible.  - RD will monitor nutrition-related plan.   NUTRITION DIAGNOSIS:   Inadequate oral intake related to inability to eat as evidenced by NPO status.  GOAL:   Patient will meet greater than or equal to 90% of their needs  MONITOR:   Diet advancement, Weight trends, Labs, I & O's  REASON FOR ASSESSMENT:   NPO/Clear Liquid Diet, LOS  ASSESSMENT:   66 year old male with history of type 2 diabetes mellitus, hyperlipidemia, hypertension, COPD, CAD, who was admitted on orthopedic surgery service with left knee osteoarthritis and underwent left total knee arthroplasty on 01/25/2017.  Patient has been progressively nauseous and has been complaining of abdominal distention. Pt found to have post-op ileus and R nare NGT placed on 9/1.  NPO/CLD x9 days and LOS 10 days. Pt states that PTA he had a good appetite with no recent changes in appetite or intakes. Pt denies abdominal pain this AM but is having some slight nausea without vomiting or feeling that he needs to vomit. Pt wa on Crb Modified diet and CLD for a short time on 8/27 and CLD for a short time on 9/4 but has otherwise been NPO throughout this admission.   NGT in place with 350cc in canister, 150cc of which is from this shift. Surgery PA note from this AM states 700cc out over the past ~24 hours. Her note also states that post-op ileus began 8/31. Plan is to continue NGT and NPO at this time.   Physical assessment shows no muscle or fat wasting. Per chart review, weight has mainly been stable since February (228-236 lbs).   Medications reviewed; 100 mg Colace BID, 10 mEq IV KCl x3 runs today. Labs reviewed.   IVF: NS @ 75 mL/hr.    Diet Order:  Diet - low sodium heart healthy Diet NPO time specified Except for: Sips with Meds, Ice Chips  Skin:  Wound (see comment) (L knee incision site from  8/27)  Last BM:  9/2  Height:   Ht Readings from Last 1 Encounters:  02/01/17 6\' 1"  (1.854 m)    Weight:   Wt Readings from Last 1 Encounters:  02/03/17 229 lb 8 oz (104.1 kg)    Ideal Body Weight:  83.64 kg  BMI:  Body mass index is 30.28 kg/m.  Estimated Nutritional Needs:   Kcal:  8242-3536 (16-18 kcal/kg)  Protein:  90-100 grams  Fluid:  >/= 2 L/day  EDUCATION NEEDS:   No education needs identified at this time    Jarome Matin, MS, RD, LDN, CNSC Inpatient Clinical Dietitian Pager # [phone] After hours/weekend pager # [phone]

## 2017-02-04 NOTE — Progress Notes (Signed)
PROGRESS NOTE  [name] DPO:[id number] DOB: 05-05-1951 DOA: 01/25/2017 PCP: Remi Haggard, FNP   LOS: 10 days   Brief Narrative / Interim history: 66 year old male with history of type 2 diabetes mellitus, hyperlipidemia, hypertension, COPD, CAD, who was admitted on orthopedic surgery service with left knee osteoarthritis and underwent left total knee arthroplasty on 01/25/2017. Patient has been progressively nauseous and has been complaining of abdominal distention over the last 24 hours, this was associated with orthostatic hypotension requiring several IV fluid boluses. He denies any chest pain or palpitations. He denies any fevers or chills. He does have a cough with minimal sputum production, and that is somewhat chronic. He reports he has not had a bowel movement in several days, however he is passing gas. He has a history of intermittent chest pains for which she was admitted on cardiology service in May 2018, underwent a cardiac catheterization which was negative for significant coronary artery disease. He also has COPD followed by Dr. Annamaria Boots with pulmonology (patient never smoked). He does have history of extensive intra-abdominal surgery (appendicectomy, Bilroth II), and has had an episode of SBO in 2015. His surgeon is Dr. Harlow Asa.    Assessment & Plan: Principal Problem:   Adynamic ileus ([place]) Active Problems:   Essential hypertension   OA (osteoarthritis) of knee   Mild aortic stenosis by prior echocardiogram   H/O major abdominal surgery   COPD mixed type (HCC)   Abdominal pain, nausea vomiting secondary to post-op ileus -NG tube was placed on 8/31, patient improved and his diet was advanced, however on 9/5 he has been having worsening nausea as well as abdominal distention, his NG tube has been clamped since 9/4.  His pupils react her stool suctioning on 9/5. -General surgery following -Improving some today, passing flatus and his abdomen looks less  distended. -Leukocytosis today noted, closely monitor  COPD -Stable, no wheezing on exam, continue his home medications  Hypertension -Given concern for hypotension last week, his ARB/diuretic was discontinued.  In addition, patient is also n.p.o.  Continue IV Lopressor, blood pressure somewhat improved today  Atrial fibrillation with RVR -patient's CHA2DS2-VASc Score for Stroke Risk is 4 -Patient developed atrial fibrillation with RVR requiring transfer to stepdown.  Cardiology consulted.  -Continue amiodarone infusion, converted back to sinus rhythm overnight and is in sinus 9/5 -Dr. Wyline Copas discussed with orthopedic surgery, okay for full dose anticoagulation.  Currently on therapeutic Lovenox as patient is n.p.o.  Plan to switch to NOAC once able  Chest pain /pressure concern for coronary artery disease -recent cardiac catheterization 3 months ago negative for significant coronary artery disease. -Most recent 2D echo October 2017 showed normal EF 60-65%, mild aortic stenosis and mild regurgitation -Troponin slightly elevated due to demand ischemia  Osteoarthritis -pt is s/p L knee arthoplasty -Per Orthopedic Surgery  ARF -Labs reviewed. Cr has normalized -Continuing on IVF, decrease rate to prevent fluid overload -Stable    DVT prophylaxis: Lovenox Code Status: Full code Family Communication: no family in the room, discussed with wife over the phone 9/5 Disposition Plan: TBD  Consultants:   Cardiology  General surgery  Procedures:   2D echo Study Conclusions - Left ventricle: The cavity size was normal. Wall thickness was increased in a pattern of moderate LVH. Systolic function was vigorous. The estimated ejection fraction was in the range of 65% to 70%. There was dynamic obstruction at rest, with a peak velocity of 210 cm/sec and a peak gradient of 18 mm Hg. Wall motion  was normal; there were no regional wall motion abnormalities. - Aortic valve: Valve mobility was  restricted, worse in the non-coronary cusp. There was moderate stenosis. There was mild regurgitation. Peak velocity (S): 340 cm/s. Mean gradient (S): 25 mm Hg. Valve area (VTI): 1.34 cm^2. Valve area (Vmax): 1.39 cm^2. Valve area (Vmean): 1.38 cm^2. - Mitral valve: Transvalvular velocity was within the normal range. There was no evidence for stenosis. There was no regurgitation. - Left atrium: The atrium was mildly dilated. - Right ventricle: The cavity size was normal. Wall thickness was normal. Systolic function was normal. - Tricuspid valve: There was trivial regurgitation.  Antimicrobials:  None   Subjective: -Less abdominal discomfort, nausea.  Passing gas.  No chest pain/palpitations  Objective: Vitals:   02/04/17 0600 02/04/17 0800 02/04/17 0842 02/04/17 0924  BP: (!) 177/73   (!) 174/62  Pulse:      Resp: 14     Temp:  98 F (36.7 C)    TempSrc:  Oral    SpO2: 100%  97%   Weight:      Height:        Intake/Output Summary (Last 24 hours) at 02/04/17 1058 Last data filed at 02/04/17 0751  Gross per 24 hour  Intake           1658.8 ml  Output             1325 ml  Net            333.8 ml   Filed Weights   01/25/17 0951 02/01/17 1330 02/03/17 0355  Weight: 104.8 kg (231 lb) 100.9 kg (222 lb 7.1 oz) 104.1 kg (229 lb 8 oz)    Examination:  Vitals:   02/04/17 0600 02/04/17 0800 02/04/17 0842 02/04/17 0924  BP: (!) 177/73   (!) 174/62  Pulse:      Resp: 14     Temp:  98 F (36.7 C)    TempSrc:  Oral    SpO2: 100%  97%   Weight:      Height:       Constitutional: NAD, calm, comfortable Eyes: lids and conjunctivae normal ENMT: Mucous membranes are moist.  Neck: normal, supple Respiratory: clear to auscultation bilaterally, no wheezing, no crackles. Normal respiratory effort.  Cardiovascular: Regular rate and rhythm, 3/6 SEM. No LE edema. 2+ pedal pulses.  Abdomen: no tenderness. Bowel sounds positive.  Skin: left knee with healing surgical scar. Formed few  blisters. Neurologic: non focal   Data Reviewed: I have independently reviewed following labs and imaging studies  CBC:  Recent Labs Lab 02/01/17 0424 02/02/17 0512 02/04/17 0306  WBC 15.3* 17.8* 23.3*  HGB 11.2* 11.3* 13.1  HCT 32.2* 33.1* 38.1*  MCV 86.6 85.3 86.6  PLT 418* 530* 540*   Basic Metabolic Panel:  Recent Labs Lab 01/31/17 0504 02/01/17 0424 02/01/17 1050 02/02/17 0512 02/03/17 0319 02/04/17 0306  NA 135 137  --  139 137 138  K 3.2* 4.0  --  3.1* 3.5 3.7  CL 98* 101  --  103 103 104  CO2 23 25  --  26 25 24   GLUCOSE 136* 124*  --  167* 142* 121*  BUN 47* 26*  --  17 15 17   CREATININE 1.07 0.77  --  0.63 0.64 0.63  CALCIUM 8.6* 8.8*  --  8.8* 8.9 8.9  MG  --   --  2.5*  --   --   --    GFR: Estimated Creatinine Clearance:  115.1 mL/min (by C-G formula based on SCr of 0.63 mg/dL). Liver Function Tests:  Recent Labs Lab 01/29/17 0741 01/30/17 0555 01/31/17 0504  AST 31 35 23  ALT 26 34 29  ALKPHOS 77 79 74  BILITOT 2.6* 2.2* 2.0*  PROT 8.3* 7.4 7.2  ALBUMIN 3.7 3.2* 3.2*   No results for input(s): LIPASE, AMYLASE in the last 168 hours. No results for input(s): AMMONIA in the last 168 hours. Coagulation Profile: No results for input(s): INR, PROTIME in the last 168 hours. Cardiac Enzymes:  Recent Labs Lab 02/01/17 1734 02/01/17 2316 02/02/17 0512 02/02/17 1057 02/02/17 1952  TROPONINI 0.22* 0.27* 0.22* 0.14* 0.14*   BNP (last 3 results) No results for input(s): PROBNP in the last 8760 hours. HbA1C: No results for input(s): HGBA1C in the last 72 hours. CBG:  Recent Labs Lab 01/29/17 0812 01/29/17 1211 01/29/17 1652 01/29/17 2120  GLUCAP 128* 141* 162* 157*   Lipid Profile: No results for input(s): CHOL, HDL, LDLCALC, TRIG, CHOLHDL, LDLDIRECT in the last 72 hours. Thyroid Function Tests: No results for input(s): TSH, T4TOTAL, FREET4, T3FREE, THYROIDAB in the last 72 hours. Anemia Panel: No results for input(s): VITAMINB12,  FOLATE, FERRITIN, TIBC, IRON, RETICCTPCT in the last 72 hours. Urine analysis:    Component Value Date/Time   COLORURINE ORANGE (A) 01/16/2014 1146   APPEARANCEUR TURBID (A) 01/16/2014 1146   LABSPEC 1.031 (H) 01/16/2014 1146   PHURINE 5.5 01/16/2014 1146   GLUCOSEU NEGATIVE 01/16/2014 1146   GLUCOSEU NEGATIVE 02/02/2007 0936   HGBUR NEGATIVE 01/16/2014 1146   BILIRUBINUR negative 12/10/2015 1503   KETONESUR 15 (A) 01/16/2014 1146   PROTEINUR negative 12/10/2015 1503   PROTEINUR NEGATIVE 01/16/2014 1146   UROBILINOGEN 2.0 12/10/2015 1503   UROBILINOGEN 1.0 01/16/2014 1146   NITRITE negative 12/10/2015 1503   NITRITE NEGATIVE 01/16/2014 1146   LEUKOCYTESUR Negative 12/10/2015 1503   Sepsis Labs: Invalid input(s): PROCALCITONIN, LACTICIDVEN  Recent Results (from the past 240 hour(s))  MRSA PCR Screening     Status: None   Collection Time: 02/01/17  1:46 PM  Result Value Ref Range Status   MRSA by PCR NEGATIVE NEGATIVE Final    Comment:        The GeneXpert MRSA Assay (FDA approved for NASAL specimens only), is one component of a comprehensive MRSA colonization surveillance program. It is not intended to diagnose MRSA infection nor to guide or monitor treatment for MRSA infections.       Radiology Studies: Dg Abd Portable 1v  Result Date: 02/04/2017 CLINICAL DATA:  66 year old male with bowel obstruction versus ileus. EXAM: PORTABLE ABDOMEN - 1 VIEW COMPARISON:  02/03/2017 and earlier. FINDINGS: Portable AP supine views at 811 hours. Lung bases remain clear. Stable enteric tube looped in the proximal stomach. Stable right upper quadrant and epigastric surgical clips. Gas distended loops of small and large bowel in the abdomen arm slightly less numerous than 01/30/2017. Left upper quadrant small bowel still measures up to 16 mm diameter. The large bowel is less distended. No definite pneumoperitoneum on these supine views. No acute osseous abnormality identified. IMPRESSION:  1. Stable NG tube.  Negative lung bases. 2. Minimally improved bowel gas pattern since 01/30/2017. Electronically Signed   By: Genevie Ann M.D.   On: 02/04/2017 08:37   Dg Abd Portable 1v  Result Date: 02/02/2017 CLINICAL DATA:  Ileus. EXAM: PORTABLE ABDOMEN - 1 VIEW COMPARISON:  01/30/2017 FINDINGS: An enteric tube has been placed with tip and side hole projecting over the gastric  fundus. Surgical clips are noted in the upper abdomen. Gas-filled dilated loops of small bowel are again seen throughout the abdomen with overall mild improvement of bowel distention compared to the prior study. Gas and stool are present in the colon. The visualized lung bases are clear. No acute osseous abnormality is seen. IMPRESSION: Slight improvement of bowel dilatation suggesting ileus. Electronically Signed   By: Logan Bores M.D.   On: 02/02/2017 19:14   Dg Abd Portable 2v  Result Date: 02/03/2017 CLINICAL DATA:  Ileus.  Recent left total knee arthroplasty. EXAM: PORTABLE ABDOMEN - 2 VIEW COMPARISON:  02/02/2017 abdominal radiographs FINDINGS: Enteric tube terminates in the gastric fundus. Mild bibasilar atelectasis. Surgical clips overlie the upper abdomen bilaterally. Diffuse dilatation of small and large bowel throughout the abdomen with small bowel loops measuring up to 6.2 cm diameter in the left abdomen, not appreciably changed. Moderate stool in the proximal colon. No evidence of pneumatosis or pneumoperitoneum. No radiopaque urolithiasis. IMPRESSION: 1. Enteric tube terminates in the gastric fundus . 2. No appreciable change in diffuse dilatation of small and large bowel loops throughout the abdomen, compatible with adynamic ileus. Electronically Signed   By: Ilona Sorrel M.D.   On: 02/03/2017 08:30     Scheduled Meds: . albuterol  2.5 mg Nebulization BID  . bisacodyl  10 mg Rectal Daily  . digoxin  0.25 mg Intravenous Daily  . diltiazem  60 mg Oral Q8H  . docusate sodium  100 mg Oral BID  . enoxaparin  (LOVENOX) injection  100 mg Subcutaneous Q12H  . fluticasone  1 spray Each Nare Daily  . fluticasone furoate-vilanterol  1 puff Inhalation Daily  . metoprolol tartrate  10 mg Intravenous Q6H  . Naldemedine Tosylate  0.2 mg Oral Q1200  . sodium phosphate  1 enema Rectal Once   Continuous Infusions: . sodium chloride 1,000 mL (02/04/17 1054)  . amiodarone 30 mg/hr (02/04/17 0836)  . ceFAZolin    . dilTIAZem HCl-Dextrose Stopped (02/03/17 0119)  . methocarbamol (ROBAXIN)  IV Stopped (02/02/17 2142)  . potassium chloride 10 mEq (02/04/17 1055)    Marzetta Board, MD, PhD [facility] Pager [phone]  If 7PM-7AM, please contact night-coverage [email or web address] Password [email or web address] 02/04/2017, 10:58 AM

## 2017-02-04 NOTE — Progress Notes (Signed)
PT Cancellation Note  Patient Details Name: [name] MRN: [id number] DOB: Feb 28, 1951   Cancelled Treatment:      PT attempted and pt initially agreeable but then declined 2*ongoing nausea and pain.  Will follow.    Savalas Monje 02/04/2017, 4:48 PM

## 2017-02-05 DIAGNOSIS — I483 Typical atrial flutter: Secondary | ICD-10-CM

## 2017-02-05 LAB — COMPREHENSIVE METABOLIC PANEL
ALT: 73 U/L — AB (ref 17–63)
AST: 51 U/L — ABNORMAL HIGH (ref 15–41)
Albumin: 2.7 g/dL — ABNORMAL LOW (ref 3.5–5.0)
Alkaline Phosphatase: 83 U/L (ref 38–126)
Anion gap: 9 (ref 5–15)
BUN: 17 mg/dL (ref 6–20)
CHLORIDE: 102 mmol/L (ref 101–111)
CO2: 24 mmol/L (ref 22–32)
CREATININE: 0.57 mg/dL — AB (ref 0.61–1.24)
Calcium: 8.7 mg/dL — ABNORMAL LOW (ref 8.9–10.3)
GFR calc non Af Amer: >60 mL/min (ref >60)
Glucose, Bld: 119 mg/dL — ABNORMAL HIGH (ref 65–99)
Potassium: 3.5 mmol/L (ref 3.5–5.1)
SODIUM: 135 mmol/L (ref 135–145)
Total Bilirubin: 1.5 mg/dL — ABNORMAL HIGH (ref 0.3–1.2)
Total Protein: 6 g/dL — ABNORMAL LOW (ref 6.5–8.1)

## 2017-02-05 LAB — CBC
HCT: 34.2 % — ABNORMAL LOW (ref 39.0–52.0)
Hemoglobin: 11.3 g/dL — ABNORMAL LOW (ref 13.0–17.0)
MCH: 28.8 pg (ref 26.0–34.0)
MCHC: 33 g/dL (ref 30.0–36.0)
MCV: 87 fL (ref 78.0–100.0)
Platelets: 549 K/uL — ABNORMAL HIGH (ref 150–400)
RBC: 3.93 MIL/uL — ABNORMAL LOW (ref 4.22–5.81)
RDW: 13.6 % (ref 11.5–15.5)
WBC: 18.1 K/uL — ABNORMAL HIGH (ref 4.0–10.5)

## 2017-02-05 NOTE — Progress Notes (Signed)
Subjective: 11 Days Post-Op Procedure(s) (LRB): LEFT TOTAL KNEE ARTHROPLASTY (Left) Patient reports pain as mild.   Patient seen in rounds with Dr. Wynelle Link. He is doing a little better this morning.  Had two movements and belly is a little better.  WBC is down a bit. Patient is well, but has had some minor complaints of pain in the knee.  He has been doing bed exercises. Plan is to go Home after hospital stay.  Objective: Vital signs in last 24 hours: Temp:  [97.7 F (36.5 C)-98.7 F (37.1 C)] 98.7 F (37.1 C) (09/07 0409) Resp:  [10-57] 18 (09/07 0540) BP: (158-214)/(62-88) 193/74 (09/07 0530) SpO2:  [97 %-100 %] 100 % (09/06 2008)  Intake/Output from previous day:  Intake/Output Summary (Last 24 hours) at 02/05/17 0742 Last data filed at 02/05/17 0600  Gross per 24 hour  Intake          2034.33 ml  Output             2200 ml  Net          -165.67 ml    Intake/Output this shift: No intake/output data recorded.  Labs:  Recent Labs  02/04/17 0306 02/05/17 0328  HGB 13.1 11.3*    Recent Labs  02/04/17 0306 02/05/17 0328  WBC 23.3* 18.1*  RBC 4.40 3.93*  HCT 38.1* 34.2*  PLT 577* 549*    Recent Labs  02/04/17 0306 02/05/17 0328  NA 138 135  K 3.7 3.5  CL 104 102  CO2 24 24  BUN 17 17  CREATININE 0.63 0.57*  GLUCOSE 121* 119*  CALCIUM 8.9 8.7*   No results for input(s): LABPT, INR in the last 72 hours.  EXAM General - Patient is Alert, Appropriate and Oriented Extremity - Neurovascular intact Sensation intact distally Intact pulses distally Dorsiflexion/Plantar flexion intact Dressing/Incision - no drainage, blisters noted and appear to be stable at this time. Motor Function - intact, moving foot and toes well on exam.   Past Medical History:  Diagnosis Date  . Anemia   . Asthma   . Back pain, chronic, followed at pain clinic 11/25/2011  . Carotid artery stenosis 09/06/2014  . Chest pain with low risk for cardiac etiology 10/2011   Non-obstructive CAD by Cath; negative Lexiscan in 08/2011  . Clotting disorder (HCC)    LLE DVT (also small vessel)  . Colon polyps   . COPD (chronic obstructive pulmonary disease) ([place])   . Dementia    Mild  . Depression   . Dyslipidemia (high LDL; low HDL)    statin intolerant; on fibrate  . Edema leg    Wears compression stockings; 08/2012 dopplers - no DVT or thrombophlebitis; mild R Popliteal V reflux - no VNUS ablation candidate  . Fainting   . Family history of acute myocardial infarction. and premature CAD 11/25/2011  . GERD (gastroesophageal reflux disease)   . Hearing loss   . History of diabetes mellitus   . Hypertension    very labile  . Mild aortic stenosis by prior echocardiogram 03/2012   moderate sclerosis  . Nephrolithiasis   . Osteoarthritis   . Other idiopathic peripheral autonomic neuropathy    Agent orange  . PTSD (post-traumatic stress disorder) 07/24/2015   per patient approach from foot of bed to awake; do not apply any contricting pressure, also avoid approaching from behind with any loud noises   . Seizure ([place])   . Small bowel obstruction ([place]) 1990s, 2001, 2015  s/p multiple bowel surgeries; from war wounds  . Stroke ([place])   . Weakness     Assessment/Plan: 11 Days Post-Op Procedure(s) (LRB): LEFT TOTAL KNEE ARTHROPLASTY (Left) Principal Problem:   Adynamic ileus (HCC) Active Problems:   Essential hypertension   OA (osteoarthritis) of knee   Mild aortic stenosis by prior echocardiogram   H/O major abdominal surgery   COPD mixed type (HCC)  Estimated body mass index is 30.28 kg/m as calculated from the following:   Height as of this encounter: 6\' 1"  (1.854 m).   Weight as of this encounter: 104.1 kg (229 lb 8 oz).  Greatly appreciated Medical and Cardiology assistance with [name]. Doing SLR's so does not need the Knee Immobilizer at this time. Incision okay and blisters noted. Can go without dressing on knee unless blisters rupture and  drainage occurs. Pillow may be used under the foot and ankle but NO PILLOW behind the knee. Resume therapy once okay from medical/cardiac standpoint to resume therapy. Continue to encourage ROM of the knee and work on flexion and extension.  DVT Prophylaxis - Lovenox Weight-Bearing as tolerated to left leg  Arlee Muslim, PA-C Orthopaedic Surgery 02/05/2017, 7:42 AM

## 2017-02-05 NOTE — Progress Notes (Signed)
11 Days Post-Op   Subjective/Chief Complaint: Says he feels better. Had a couple bm's last night   Objective: Vital signs in last 24 hours: Temp:  [97.6 F (36.4 C)-98.7 F (37.1 C)] 97.6 F (36.4 C) (09/07 0800) Resp:  [10-57] 13 (09/07 0900) BP: (158-214)/(62-88) 192/65 (09/07 0900) SpO2:  [99 %-100 %] 100 % (09/06 2008) Last BM Date: 02/04/17  Intake/Output from previous day: 09/06 0701 - 09/07 0700 In: 2034.3 [I.V.:1909.3; NG/GT:125] Out: 2200 [Urine:950; Emesis/NG output:1250] Intake/Output this shift: Total I/O In: 200.1 [I.V.:200.1] Out: -   General appearance: alert and cooperative Resp: clear to auscultation bilaterally Cardio: regular rate and rhythm GI: soft, nontender  Lab Results:   Recent Labs  02/04/17 0306 02/05/17 0328  WBC 23.3* 18.1*  HGB 13.1 11.3*  HCT 38.1* 34.2*  PLT 577* 549*   BMET  Recent Labs  02/04/17 0306 02/05/17 0328  NA 138 135  K 3.7 3.5  CL 104 102  CO2 24 24  GLUCOSE 121* 119*  BUN 17 17  CREATININE 0.63 0.57*  CALCIUM 8.9 8.7*   PT/INR No results for input(s): LABPROT, INR in the last 72 hours. ABG No results for input(s): PHART, HCO3 in the last 72 hours.  Invalid input(s): PCO2, PO2  Studies/Results: Dg Abd Portable 1v  Result Date: 02/04/2017 CLINICAL DATA:  66 year old male with bowel obstruction versus ileus. EXAM: PORTABLE ABDOMEN - 1 VIEW COMPARISON:  02/03/2017 and earlier. FINDINGS: Portable AP supine views at 811 hours. Lung bases remain clear. Stable enteric tube looped in the proximal stomach. Stable right upper quadrant and epigastric surgical clips. Gas distended loops of small and large bowel in the abdomen arm slightly less numerous than 01/30/2017. Left upper quadrant small bowel still measures up to 16 mm diameter. The large bowel is less distended. No definite pneumoperitoneum on these supine views. No acute osseous abnormality identified. IMPRESSION: 1. Stable NG tube.  Negative lung bases. 2.  Minimally improved bowel gas pattern since 01/30/2017. Electronically Signed   By: Genevie Ann M.D.   On: 02/04/2017 08:37    Anti-infectives: Anti-infectives    Start     Dose/Rate Route Frequency Ordered Stop   02/04/17 1115  ceFAZolin (ANCEF) IVPB 2g/100 mL premix  Status:  Discontinued     2 g 200 mL/hr over 30 Minutes Intravenous  Once 02/04/17 1110 02/04/17 1152   02/04/17 0710  ceFAZolin (ANCEF) 2-4 GM/100ML-% IVPB    Comments:  [name]   : cabinet override      02/04/17 0710 02/04/17 1914   02/04/17 0651  vancomycin (VANCOCIN) 1-5 GM/200ML-% IVPB    Comments:  [name]   : cabinet override      02/04/17 0651 02/04/17 0912   01/25/17 1800  ceFAZolin (ANCEF) IVPB 2g/100 mL premix     2 g 200 mL/hr over 30 Minutes Intravenous Every 6 hours 01/25/17 1510 01/25/17 2359   01/25/17 1003  ceFAZolin (ANCEF) 2-4 GM/100ML-% IVPB    Comments:  [name]   : cabinet override      01/25/17 1003 01/25/17 1130   01/25/17 0933  ceFAZolin (ANCEF) IVPB 2g/100 mL premix     2 g 200 mL/hr over 30 Minutes Intravenous On call to O.R. 01/25/17 0933 01/25/17 1200      Assessment/Plan: s/p Procedure(s) with comments: LEFT TOTAL KNEE ARTHROPLASTY (Left) - Adductor Block Advance diet. Allow sips of clears. Clamp ng Continue to monitor sbo seems to be improving  LOS: 11 days  TOTH III,Wylie Russon S 02/05/2017

## 2017-02-05 NOTE — Progress Notes (Addendum)
PROGRESS NOTE  [name] QVZ:[id number] DOB: 07-Oct-1950 DOA: 01/25/2017 PCP: Remi Haggard, FNP   LOS: 11 days   Brief Narrative / Interim history: 66 year old male with history of type 2 diabetes mellitus, hyperlipidemia, hypertension, COPD, CAD, who was admitted on orthopedic surgery service with left knee osteoarthritis and underwent left total knee arthroplasty on 01/25/2017. Patient has been progressively nauseous and has been complaining of abdominal distention over the last 24 hours, this was associated with orthostatic hypotension requiring several IV fluid boluses. He denies any chest pain or palpitations. He denies any fevers or chills. He does have a cough with minimal sputum production, and that is somewhat chronic. He reports he has not had a bowel movement in several days, however he is passing gas. He has a history of intermittent chest pains for which she was admitted on cardiology service in May 2018, underwent a cardiac catheterization which was negative for significant coronary artery disease. He also has COPD followed by Dr. Annamaria Boots with pulmonology (patient never smoked). He does have history of extensive intra-abdominal surgery (appendicectomy, Bilroth II), and has had an episode of SBO in 2015. His surgeon is Dr. Harlow Asa.    Assessment & Plan: Principal Problem:   Adynamic ileus ([place]) Active Problems:   Essential hypertension   OA (osteoarthritis) of knee   Mild aortic stenosis by prior echocardiogram   H/O major abdominal surgery   COPD mixed type (HCC)   Abdominal pain, nausea vomiting secondary to post-op ileus -NG tube was placed on 8/31, patient improved and his diet was advanced, however on 9/5 he has been having worsening nausea as well as abdominal distention, requiring change back to n.p.o. Status.  General surgery was consulted at that time and are following -patient had 2 bowel movements overnight, clamp NG tube and allow sips of  water.  COPD -Stable, no wheezing on exam, continue his home medications  Hypertension -Given concern for hypotension last week, his ARB/diuretic was discontinued.  In addition, patient is also n.p.o.  Continue IV Lopressor, blood pressure still remains on the high side, if he is tolerating p.o. and resume home medications ASAP  Atrial fibrillation with RVR -patient's CHA2DS2-VASc Score for Stroke Risk is 4 -Patient developed atrial fibrillation with RVR requiring transfer to stepdown.  Cardiology consulted.  -Continue amiodarone infusion, converted back to sinus rhythm overnight and is in sinus since 9/5 -Dr. Wyline Copas discussed with orthopedic surgery, okay for full dose anticoagulation.  Currently on therapeutic Lovenox as patient is n.p.o.  Plan to switch to NOAC once able  Chest pain /pressure concern for coronary artery disease -recent cardiac catheterization 3 months ago negative for significant coronary artery disease. -Most recent 2D echo October 2017 showed normal EF 60-65%, mild aortic stenosis and mild regurgitation -Troponin slightly elevated due to demand ischemia  Osteoarthritis -pt is s/p L knee arthoplasty -Per Orthopedic Surgery, discussed with Dara Lords, Alexzandrew L, PA-C, he has couple of blisters on top of the left knee however overall wound looks good from their perspective  ARF -Labs reviewed. Cr has normalized -Continuing on IVF, decrease rate to prevent fluid overload -Stable    DVT prophylaxis: Lovenox Code Status: Full code Family Communication: no family in the room, discussed with wife over the phone 9/5 Disposition Plan: TBD  Consultants:   Cardiology  General surgery  Procedures:   2D echo Study Conclusions - Left ventricle: The cavity size was normal. Wall thickness was increased in a pattern of moderate LVH. Systolic function was vigorous. The  estimated ejection fraction was in the range of 65% to 70%. There was dynamic obstruction at rest,  with a peak velocity of 210 cm/sec and a peak gradient of 18 mm Hg. Wall motion was normal; there were no regional wall motion abnormalities. - Aortic valve: Valve mobility was restricted, worse in the non-coronary cusp. There was moderate stenosis. There was mild regurgitation. Peak velocity (S): 340 cm/s. Mean gradient (S): 25 mm Hg. Valve area (VTI): 1.34 cm^2. Valve area (Vmax): 1.39 cm^2. Valve area (Vmean): 1.38 cm^2. - Mitral valve: Transvalvular velocity was within the normal range. There was no evidence for stenosis. There was no regurgitation. - Left atrium: The atrium was mildly dilated. - Right ventricle: The cavity size was normal. Wall thickness was normal. Systolic function was normal. - Tricuspid valve: There was trivial regurgitation.  Antimicrobials:  None   Subjective: -Upbeat today about being able to have couple bowel movements.  Less nausea today.  Objective: Vitals:   02/05/17 0540 02/05/17 0700 02/05/17 0800 02/05/17 0900  BP:  (!) 188/77 (!) 178/86 (!) 192/65  Pulse:      Resp: 18 18 (!) 21 13  Temp:   97.6 F (36.4 C)   TempSrc:   Axillary   SpO2:      Weight:      Height:        Intake/Output Summary (Last 24 hours) at 02/05/17 1124 Last data filed at 02/05/17 0900  Gross per 24 hour  Intake          2109.43 ml  Output             1575 ml  Net           534.43 ml   Filed Weights   01/25/17 0951 02/01/17 1330 02/03/17 0355  Weight: 104.8 kg (231 lb) 100.9 kg (222 lb 7.1 oz) 104.1 kg (229 lb 8 oz)    Examination:  Vitals:   02/05/17 0540 02/05/17 0700 02/05/17 0800 02/05/17 0900  BP:  (!) 188/77 (!) 178/86 (!) 192/65  Pulse:      Resp: 18 18 (!) 21 13  Temp:   97.6 F (36.4 C)   TempSrc:   Axillary   SpO2:      Weight:      Height:       Constitutional: NAD, calm, comfortable Eyes: PERRL, lids and conjunctivae normal Respiratory: clear to auscultation bilaterally, no wheezing, no crackles. Normal respiratory effort.  Cardiovascular:  Regular rate and rhythm, 3/6 SEM. No LE edema. 2+ pedal pulses.  Abdomen: no tenderness. Bowel sounds positive.  Skin: no rashes, lesions, ulcers.  Large blisters on top of the left knee, healing surgical scar Neurologic: non focal   Data Reviewed: I have independently reviewed following labs and imaging studies  CBC:  Recent Labs Lab 02/01/17 0424 02/02/17 0512 02/04/17 0306 02/05/17 0328  WBC 15.3* 17.8* 23.3* 18.1*  HGB 11.2* 11.3* 13.1 11.3*  HCT 32.2* 33.1* 38.1* 34.2*  MCV 86.6 85.3 86.6 87.0  PLT 418* 530* 577* 425*   Basic Metabolic Panel:  Recent Labs Lab 02/01/17 0424 02/01/17 1050 02/02/17 0512 02/03/17 0319 02/04/17 0306 02/05/17 0328  NA 137  --  139 137 138 135  K 4.0  --  3.1* 3.5 3.7 3.5  CL 101  --  103 103 104 102  CO2 25  --  26 25 24 24   GLUCOSE 124*  --  167* 142* 121* 119*  BUN 26*  --  17 15 17  17  CREATININE 0.77  --  0.63 0.64 0.63 0.57*  CALCIUM 8.8*  --  8.8* 8.9 8.9 8.7*  MG  --  2.5*  --   --   --   --    GFR: Estimated Creatinine Clearance: 115.1 mL/min (A) (by C-G formula based on SCr of 0.57 mg/dL (L)). Liver Function Tests:  Recent Labs Lab 01/30/17 0555 01/31/17 0504 02/05/17 0328  AST 35 23 51*  ALT 34 29 73*  ALKPHOS 79 74 83  BILITOT 2.2* 2.0* 1.5*  PROT 7.4 7.2 6.0*  ALBUMIN 3.2* 3.2* 2.7*   No results for input(s): LIPASE, AMYLASE in the last 168 hours. No results for input(s): AMMONIA in the last 168 hours. Coagulation Profile: No results for input(s): INR, PROTIME in the last 168 hours. Cardiac Enzymes:  Recent Labs Lab 02/01/17 1734 02/01/17 2316 02/02/17 0512 02/02/17 1057 02/02/17 1952  TROPONINI 0.22* 0.27* 0.22* 0.14* 0.14*   BNP (last 3 results) No results for input(s): PROBNP in the last 8760 hours. HbA1C: No results for input(s): HGBA1C in the last 72 hours. CBG:  Recent Labs Lab 01/29/17 1211 01/29/17 1652 01/29/17 2120  GLUCAP 141* 162* 157*   Lipid Profile: No results for  input(s): CHOL, HDL, LDLCALC, TRIG, CHOLHDL, LDLDIRECT in the last 72 hours. Thyroid Function Tests: No results for input(s): TSH, T4TOTAL, FREET4, T3FREE, THYROIDAB in the last 72 hours. Anemia Panel: No results for input(s): VITAMINB12, FOLATE, FERRITIN, TIBC, IRON, RETICCTPCT in the last 72 hours. Urine analysis:    Component Value Date/Time   COLORURINE ORANGE (A) 01/16/2014 1146   APPEARANCEUR TURBID (A) 01/16/2014 1146   LABSPEC 1.031 (H) 01/16/2014 1146   PHURINE 5.5 01/16/2014 1146   GLUCOSEU NEGATIVE 01/16/2014 1146   GLUCOSEU NEGATIVE 02/02/2007 0936   HGBUR NEGATIVE 01/16/2014 1146   BILIRUBINUR negative 12/10/2015 1503   KETONESUR 15 (A) 01/16/2014 1146   PROTEINUR negative 12/10/2015 1503   PROTEINUR NEGATIVE 01/16/2014 1146   UROBILINOGEN 2.0 12/10/2015 1503   UROBILINOGEN 1.0 01/16/2014 1146   NITRITE negative 12/10/2015 1503   NITRITE NEGATIVE 01/16/2014 1146   LEUKOCYTESUR Negative 12/10/2015 1503   Sepsis Labs: Invalid input(s): PROCALCITONIN, LACTICIDVEN  Recent Results (from the past 240 hour(s))  MRSA PCR Screening     Status: None   Collection Time: 02/01/17  1:46 PM  Result Value Ref Range Status   MRSA by PCR NEGATIVE NEGATIVE Final    Comment:        The GeneXpert MRSA Assay (FDA approved for NASAL specimens only), is one component of a comprehensive MRSA colonization surveillance program. It is not intended to diagnose MRSA infection nor to guide or monitor treatment for MRSA infections.       Radiology Studies: Dg Abd Portable 1v  Result Date: 02/04/2017 CLINICAL DATA:  66 year old male with bowel obstruction versus ileus. EXAM: PORTABLE ABDOMEN - 1 VIEW COMPARISON:  02/03/2017 and earlier. FINDINGS: Portable AP supine views at 811 hours. Lung bases remain clear. Stable enteric tube looped in the proximal stomach. Stable right upper quadrant and epigastric surgical clips. Gas distended loops of small and large bowel in the abdomen arm  slightly less numerous than 01/30/2017. Left upper quadrant small bowel still measures up to 16 mm diameter. The large bowel is less distended. No definite pneumoperitoneum on these supine views. No acute osseous abnormality identified. IMPRESSION: 1. Stable NG tube.  Negative lung bases. 2. Minimally improved bowel gas pattern since 01/30/2017. Electronically Signed   By: Herminio Heads.D.  On: 02/04/2017 08:37     Scheduled Meds: . albuterol  2.5 mg Nebulization BID  . bisacodyl  10 mg Rectal Daily  . digoxin  0.25 mg Intravenous Daily  . diltiazem  60 mg Oral Q8H  . docusate sodium  100 mg Oral BID  . enoxaparin (LOVENOX) injection  100 mg Subcutaneous Q12H  . fluticasone  1 spray Each Nare Daily  . fluticasone furoate-vilanterol  1 puff Inhalation Daily  . metoprolol tartrate  10 mg Intravenous Q6H  . Naldemedine Tosylate  0.2 mg Oral Q1200   Continuous Infusions: . sodium chloride 50 mL/hr at 02/05/17 0600  . amiodarone 30 mg/hr (02/05/17 0840)  . dilTIAZem HCl-Dextrose Stopped (02/03/17 0119)  . methocarbamol (ROBAXIN)  IV Stopped (02/02/17 2142)    Marzetta Board, MD, PhD [facility] Pager [phone]  If 7PM-7AM, please contact night-coverage [email or web address] Password [email or web address] 02/05/2017, 11:24 AM

## 2017-02-05 NOTE — Progress Notes (Signed)
Physical Therapy Treatment Patient Details Name: [name] MRN: [id number] DOB: [birth year] Today's Date: 02/05/2017    History of Present Illness Pt is a 66 year old male s/p L TKA complicated by post op ileus and PAF converted on IV cardizem and amiodarone     PT Comments    Pt agreeable to attempt OOB to recliner today.  Pt has not been OOB in days and was dizzy with sitting and standing (vitals below in mobility section).  Pt may need increased care upon d/c (possibly SNF) if pt does not start progressing mobility and tolerating well.   Follow Up Recommendations  DC plan and follow up therapy as arranged by surgeon;Supervision/Assistance - 24 hour     Equipment Recommendations  Rolling walker with 5" wheels    Recommendations for Other Services       Precautions / Restrictions Precautions Precautions: Fall;Knee Precaution Comments: able to perform SLR per last ortho note, pt also with blisters near knee incision (leave off KI unless absolutely necessary) Restrictions Other Position/Activity Restrictions: WBAT    Mobility  Bed Mobility Overal bed mobility: Needs Assistance Bed Mobility: Supine to Sit     Supine to sit: Min assist     General bed mobility comments: assist for trunk upright, increased time and effort from pt, pt reports dizziness upon sitting which became better but did not completely resolve, pt requested up to recliner  Transfers Overall transfer level: Needs assistance Equipment used: Rolling walker (2 wheeled) Transfers: Sit to/from [facility] Sit to Stand: Min assist;+2 safety/equipment Stand pivot transfers: Min assist;+2 safety/equipment       General transfer comment: verbal cues for UE and LE positioning, pt reports dizziness with transfer as well, BP at rest 174/62 mmHg and after transfer 176/56 mmHg, HR in 80s during session, SpO2 99% on room air (left off O2 [place] and RN aware and okay with this)  Ambulation/Gait             General Gait Details: pt did not feel able to tolerate today, has not been OOB in about 4 days    Stairs            Wheelchair Mobility    Modified Rankin (Stroke Patients Only)       Balance                                            Cognition Arousal/Alertness: Awake/alert Behavior During Therapy: WFL for tasks assessed/performed Overall Cognitive Status: Within Functional Limits for tasks assessed                                        Exercises      General Comments        Pertinent Vitals/Pain Pain Assessment: Faces Faces Pain Scale: Hurts little more Pain Location: abdomen and L knee Pain Descriptors / Indicators: Tightness;Aching Pain Intervention(s): Limited activity within patient's tolerance;Monitored during session;Repositioned    Home Living                      Prior Function            PT Goals (current goals can now be found in the care plan section) Acute Rehab PT Goals PT Goal Formulation: With  patient Time For Goal Achievement: 02/19/17 Potential to Achieve Goals: Fair Progress towards PT goals: Progressing toward goals    Frequency    7X/week      PT Plan Current plan remains appropriate    Co-evaluation              AM-PAC PT "6 Clicks" Daily Activity  Outcome Measure  Difficulty turning over in bed (including adjusting bedclothes, sheets and blankets)?: Unable Difficulty moving from lying on back to sitting on the side of the bed? : Unable Difficulty sitting down on and standing up from a chair with arms (e.g., wheelchair, bedside commode, etc,.)?: Unable Help needed moving to and from a bed to chair (including a wheelchair)?: A Lot Help needed walking in hospital room?: Total Help needed climbing 3-5 steps with a railing? : Total 6 Click Score: 7    End of Session Equipment Utilized During Treatment: Gait belt Activity Tolerance: Other (comment) (limited  by dizziness) Patient left: with call bell/phone within reach;in chair;with family/visitor present Nurse Communication: Mobility status PT Visit Diagnosis: Difficulty in walking, not elsewhere classified (R26.2);Pain Pain - Right/Left: Left Pain - part of body: Knee     Time: 1336-1350 PT Time Calculation (min) (ACUTE ONLY): 14 min  Charges:  $Therapeutic Activity: 8-22 mins                    G Codes:       Carmelia Bake, PT, DPT 02/05/2017 Pager: [phone]  York Ram E 02/05/2017, 2:45 PM

## 2017-02-05 NOTE — Progress Notes (Signed)
Progress Note  Patient Name: [name] Date of Encounter: 02/05/2017  Primary Cardiologist: harding   Subjective   2 BM;s less pain but NG tube still with bilious material  Inpatient Medications    Scheduled Meds: . albuterol  2.5 mg Nebulization BID  . bisacodyl  10 mg Rectal Daily  . digoxin  0.25 mg Intravenous Daily  . diltiazem  60 mg Oral Q8H  . docusate sodium  100 mg Oral BID  . enoxaparin (LOVENOX) injection  100 mg Subcutaneous Q12H  . fluticasone  1 spray Each Nare Daily  . fluticasone furoate-vilanterol  1 puff Inhalation Daily  . metoprolol tartrate  10 mg Intravenous Q6H  . Naldemedine Tosylate  0.2 mg Oral Q1200  . sodium phosphate  1 enema Rectal Once   Continuous Infusions: . sodium chloride 50 mL/hr at 02/05/17 0600  . amiodarone 30 mg/hr (02/05/17 0840)  . dilTIAZem HCl-Dextrose Stopped (02/03/17 0119)  . methocarbamol (ROBAXIN)  IV Stopped (02/02/17 2142)   PRN Meds: acetaminophen **OR** acetaminophen, albuterol, ALPRAZolam, benzonatate, cyclobenzaprine, diphenhydrAMINE, furosemide, hydrALAZINE, ketorolac, labetalol, LORazepam, home med stored in pyxis, menthol-cetylpyridinium **OR** phenol, methocarbamol (ROBAXIN)  IV, metoCLOPramide **OR** metoCLOPramide (REGLAN) injection, morphine, morphine injection, nitroGLYCERIN, ondansetron **OR** ondansetron (ZOFRAN) IV, polyethylene glycol, promethazine, promethazine, sodium phosphate, traMADol   Vital Signs    Vitals:   02/05/17 0540 02/05/17 0700 02/05/17 0800 02/05/17 0900  BP:  (!) 188/77 (!) 178/86 (!) 192/65  Pulse:      Resp: 18 18 (!) 21 13  Temp:   97.6 F (36.4 C)   TempSrc:   Axillary   SpO2:      Weight:      Height:        Intake/Output Summary (Last 24 hours) at 02/05/17 0935 Last data filed at 02/05/17 0900  Gross per 24 hour  Intake          2234.43 ml  Output             1575 ml  Net           659.43 ml   Filed Weights   01/25/17 0951 02/01/17 1330 02/03/17 0355  Weight:  231 lb (104.8 kg) 222 lb 7.1 oz (100.9 kg) 229 lb 8 oz (104.1 kg)    Telemetry    NSR 02/05/2017  - Personally Reviewed  ECG    afib no acute ST changes  - Personally Reviewed  Physical Exam  NG tube in place  GEN: uncomfortable and nauseated    Neck: No JVD Cardiac: RRR, SEM  murmurs, rubs, or gallops.  Respiratory: Clear to auscultation bilaterally. GI: Soft, still distended with tympany  MS: No edema; No deformity. Neuro:  Nonfocal  Psych: Normal affect  Post left TKR   Labs    Chemistry Recent Labs Lab 01/30/17 0555 01/31/17 0504  02/03/17 0319 02/04/17 0306 02/05/17 0328  NA 131* 135  < > 137 138 135  K 4.8 3.2*  < > 3.5 3.7 3.5  CL 94* 98*  < > 103 104 102  CO2 22 23  < > 25 24 24   GLUCOSE 144* 136*  < > 142* 121* 119*  BUN 72* 47*  < > 15 17 17   CREATININE 3.09* 1.07  < > 0.64 0.63 0.57*  CALCIUM 8.5* 8.6*  < > 8.9 8.9 8.7*  PROT 7.4 7.2  --   --   --  6.0*  ALBUMIN 3.2* 3.2*  --   --   --  2.7*  AST 35 23  --   --   --  51*  ALT 34 29  --   --   --  73*  ALKPHOS 79 74  --   --   --  83  BILITOT 2.2* 2.0*  --   --   --  1.5*  GFRNONAA 20* >60  < > >60 >60 >60  GFRAA 23* >60  < > >60 >60 >60  ANIONGAP 15 14  < > 9 10 9   < > = values in this interval not displayed.   Hematology  Recent Labs Lab 02/02/17 0512 02/04/17 0306 02/05/17 0328  WBC 17.8* 23.3* 18.1*  RBC 3.88* 4.40 3.93*  HGB 11.3* 13.1 11.3*  HCT 33.1* 38.1* 34.2*  MCV 85.3 86.6 87.0  MCH 29.1 29.8 28.8  MCHC 34.1 34.4 33.0  RDW 13.0 13.5 13.6  PLT 530* 577* 549*    Cardiac Enzymes  Recent Labs Lab 02/01/17 2316 02/02/17 0512 02/02/17 1057 02/02/17 1952  TROPONINI 0.27* 0.22* 0.14* 0.14*   No results for input(s): TROPIPOC in the last 168 hours.   BNPNo results for input(s): BNP, PROBNP in the last 168 hours.   DDimer No results for input(s): DDIMER in the last 168 hours.   Radiology    Dg Abd Portable 1v  Result Date: 02/04/2017 CLINICAL DATA:  66 year old male with  bowel obstruction versus ileus. EXAM: PORTABLE ABDOMEN - 1 VIEW COMPARISON:  02/03/2017 and earlier. FINDINGS: Portable AP supine views at 811 hours. Lung bases remain clear. Stable enteric tube looped in the proximal stomach. Stable right upper quadrant and epigastric surgical clips. Gas distended loops of small and large bowel in the abdomen arm slightly less numerous than 01/30/2017. Left upper quadrant small bowel still measures up to 16 mm diameter. The large bowel is less distended. No definite pneumoperitoneum on these supine views. No acute osseous abnormality identified. IMPRESSION: 1. Stable NG tube.  Negative lung bases. 2. Minimally improved bowel gas pattern since 01/30/2017. Electronically Signed   By: Genevie Ann M.D.   On: 02/04/2017 08:37    Cardiac Studies   Echo EF 65% moderate AS mild AR   Patient Profile     66 y.o. male post left TKR complicated by ileus PAF converted on iv cardizem and amiodarone Recent cath with no CAD Echo with normal EF and moderate AS  Assessment & Plan    PaF:  Continue iv cardizem and amiodarone NSR this am iv digoxin added 02/02/17 Troponin minimal elevation no trend normal EF by echo and no CAD cath 3 months ago Ileus: no change by KUB progress is slow passing some gas   Change digoxin to PO when he is taking oral Ice chips this am   Jenkins Rouge   Signed, Jenkins Rouge, MD  02/05/2017, 9:35 AM

## 2017-02-06 DIAGNOSIS — R110 Nausea: Secondary | ICD-10-CM

## 2017-02-06 LAB — CBC
HEMATOCRIT: 34.9 % — AB (ref 39.0–52.0)
HEMOGLOBIN: 12.1 g/dL — AB (ref 13.0–17.0)
MCH: 30.3 pg (ref 26.0–34.0)
MCHC: 34.7 g/dL (ref 30.0–36.0)
MCV: 87.3 fL (ref 78.0–100.0)
Platelets: 287 10*3/uL (ref 150–400)
RBC: 4 MIL/uL — ABNORMAL LOW (ref 4.22–5.81)
RDW: 13.7 % (ref 11.5–15.5)
WBC: 12.2 10*3/uL — AB (ref 4.0–10.5)

## 2017-02-06 LAB — BASIC METABOLIC PANEL
Anion gap: 9 (ref 5–15)
BUN: 17 mg/dL (ref 6–20)
CHLORIDE: 98 mmol/L — AB (ref 101–111)
CO2: 25 mmol/L (ref 22–32)
Calcium: 8.8 mg/dL — ABNORMAL LOW (ref 8.9–10.3)
Creatinine, Ser: 0.63 mg/dL (ref 0.61–1.24)
GFR calc Af Amer: >60 mL/min (ref >60)
GFR calc non Af Amer: >60 mL/min (ref >60)
GLUCOSE: 108 mg/dL — AB (ref 65–99)
POTASSIUM: 3.2 mmol/L — AB (ref 3.5–5.1)
Sodium: 132 mmol/L — ABNORMAL LOW (ref 135–145)

## 2017-02-06 MED ORDER — AMIODARONE HCL 200 MG PO TABS
200.0000 mg | ORAL_TABLET | Freq: Two times a day (BID) | ORAL | Status: DC
  Administered 2017-02-06 – 2017-02-09 (×7): 200 mg via ORAL
  Filled 2017-02-06 (×7): qty 1

## 2017-02-06 MED ORDER — CARVEDILOL 25 MG PO TABS
25.0000 mg | ORAL_TABLET | Freq: Two times a day (BID) | ORAL | Status: DC
  Administered 2017-02-06 – 2017-02-08 (×4): 25 mg via ORAL
  Filled 2017-02-06: qty 2
  Filled 2017-02-06: qty 1
  Filled 2017-02-06: qty 2
  Filled 2017-02-06: qty 1

## 2017-02-06 MED ORDER — HYDROCHLOROTHIAZIDE 12.5 MG PO CAPS
12.5000 mg | ORAL_CAPSULE | Freq: Every day | ORAL | Status: DC
  Administered 2017-02-06: 12.5 mg via ORAL
  Filled 2017-02-06: qty 1

## 2017-02-06 MED ORDER — IRBESARTAN 300 MG PO TABS
300.0000 mg | ORAL_TABLET | Freq: Every day | ORAL | Status: DC
  Administered 2017-02-06: 300 mg via ORAL
  Filled 2017-02-06: qty 1

## 2017-02-06 MED ORDER — SIMETHICONE 40 MG/0.6ML PO SUSP
40.0000 mg | Freq: Four times a day (QID) | ORAL | Status: DC | PRN
  Administered 2017-02-06: 40 mg via ORAL
  Filled 2017-02-06 (×2): qty 0.6

## 2017-02-06 MED ORDER — IRBESARTAN-HYDROCHLOROTHIAZIDE 300-12.5 MG PO TABS: 1.0000 | ORAL_TABLET | Freq: Every day | ORAL | Status: DC

## 2017-02-06 MED ORDER — POTASSIUM CHLORIDE CRYS ER 20 MEQ PO TBCR
40.0000 meq | EXTENDED_RELEASE_TABLET | ORAL | Status: AC
  Administered 2017-02-06 (×2): 40 meq via ORAL
  Filled 2017-02-06 (×2): qty 2

## 2017-02-06 MED ORDER — DIGOXIN 125 MCG PO TABS
0.2500 mg | ORAL_TABLET | Freq: Every day | ORAL | Status: DC
  Administered 2017-02-06 – 2017-02-07 (×2): 0.25 mg via ORAL
  Filled 2017-02-06 (×3): qty 2

## 2017-02-06 MED ORDER — RIVAROXABAN 20 MG PO TABS
20.0000 mg | ORAL_TABLET | Freq: Every day | ORAL | Status: DC
  Administered 2017-02-06 – 2017-02-09 (×4): 20 mg via ORAL
  Filled 2017-02-06 (×4): qty 1

## 2017-02-06 NOTE — Progress Notes (Signed)
  General Surgery [facility]  Assessment & Plan: Adynamic ileus  NG removed - clear liquid diet  Clinically improving Status post left TKR         Earnstine Regal, MD, [facility]       Office: [phone]    Chief Complaint: Left total knee replacement - post op ileus, cardiac complications  Subjective: Patient in ICU, awake, alert, responsive.  No abdominal complaints this AM.  Having small BM's, passing flatus, tolerating clear liquid diet  Objective: Vital signs in last 24 hours: Temp:  [96.9 F (36.1 C)-98 F (36.7 C)] 97.6 F (36.4 C) (09/08 0750) Resp:  [10-32] 20 (09/08 0805) BP: (159-195)/(69-79) 174/70 (09/08 0805) SpO2:  [94 %-100 %] 96 % (09/08 0805) Last BM Date: 02/04/17  Intake/Output from previous day: 09/07 0701 - 09/08 0700 In: 1550.8 [I.V.:1550.8] Out: 1050 [Urine:750; Emesis/NG output:300] Intake/Output this shift: Total I/O In: 139 [I.V.:139] Out: -   Physical Exam: HEENT - sclerae clear, mucous membranes moist Neck - soft Abdomen - moderate distension; non-tender; BS present Ext - moderate edema, non-tender; wound left knee with large bleb, no sign of infection; steristrips in place Neuro - alert & oriented, no focal deficits  Lab Results:   Recent Labs  02/05/17 0328 02/06/17 0747  WBC 18.1* 12.2*  HGB 11.3* 12.1*  HCT 34.2* 34.9*  PLT 549* PENDING   BMET  Recent Labs  02/05/17 0328 02/06/17 0747  NA 135 132*  K 3.5 3.2*  CL 102 98*  CO2 24 25  GLUCOSE 119* 108*  BUN 17 17  CREATININE 0.57* 0.63  CALCIUM 8.7* 8.8*   PT/INR No results for input(s): LABPROT, INR in the last 72 hours. Comprehensive Metabolic Panel:    Component Value Date/Time   NA 132 (L) 02/06/2017 0747   NA 135 02/05/2017 0328   K 3.2 (L) 02/06/2017 0747   K 3.5 02/05/2017 0328   CL 98 (L) 02/06/2017 0747   CL 102 02/05/2017 0328   CO2 25 02/06/2017 0747   CO2 24 02/05/2017 0328   BUN 17  02/06/2017 0747   BUN 17 02/05/2017 0328   CREATININE 0.63 02/06/2017 0747   CREATININE 0.57 (L) 02/05/2017 0328   CREATININE 0.90 10/15/2016 1348   CREATININE 0.79 07/24/2016 1544   GLUCOSE 108 (H) 02/06/2017 0747   GLUCOSE 119 (H) 02/05/2017 0328   CALCIUM 8.8 (L) 02/06/2017 0747   CALCIUM 8.7 (L) 02/05/2017 0328   AST 51 (H) 02/05/2017 0328   AST 23 01/31/2017 0504   ALT 73 (H) 02/05/2017 0328   ALT 29 01/31/2017 0504   ALKPHOS 83 02/05/2017 0328   ALKPHOS 74 01/31/2017 0504   BILITOT 1.5 (H) 02/05/2017 0328   BILITOT 2.0 (H) 01/31/2017 0504   PROT 6.0 (L) 02/05/2017 0328   PROT 7.2 01/31/2017 0504   ALBUMIN 2.7 (L) 02/05/2017 0328   ALBUMIN 3.2 (L) 01/31/2017 0504    Studies/Results: No results found.    [name] M 02/06/2017  Patient ID: [name], male   DOB: Aug 07, 1950, 66 y.o.   MRN: [id number]

## 2017-02-06 NOTE — Progress Notes (Signed)
Physical Therapy Treatment Patient Details Name: [name] MRN: [id number] DOB: 1951/03/03 Today's Date: 02/06/2017    History of Present Illness Pt is a 66 year old male s/p L TKA complicated by post op ileus and PAF converted on IV cardizem and amiodarone     PT Comments    Pt motivated but ltd by c/o severe dizziness 2* orthostatic BP with mobilization - pressures recorded by RN.  Follow Up Recommendations  DC plan and follow up therapy as arranged by surgeon;Supervision/Assistance - 24 hour     Equipment Recommendations  Rolling walker with 5" wheels    Recommendations for Other Services       Precautions / Restrictions Precautions Precautions: Fall;Knee Precaution Comments: able to perform IND SLR Required Braces or Orthoses: Knee Immobilizer - Left Knee Immobilizer - Left: Discontinue once straight leg raise with < 10 degree lag Restrictions Weight Bearing Restrictions: No Other Position/Activity Restrictions: WBAT    Mobility  Bed Mobility Overal bed mobility: Needs Assistance Bed Mobility: Supine to Sit     Supine to sit: Min assist     General bed mobility comments: assist to manage L LE  Transfers Overall transfer level: Needs assistance Equipment used: Rolling walker (2 wheeled) Transfers: Sit to/from [facility] Sit to Stand: Min assist;+2 safety/equipment Stand pivot transfers: Min assist;+2 safety/equipment       General transfer comment: cues for LE management and use of UEs to self assist.  Pt c/o dizziness  Ambulation/Gait             General Gait Details: Stand pvt bed to chair only 2* dizziness   Stairs            Wheelchair Mobility    Modified Rankin (Stroke Patients Only)       Balance                                            Cognition Arousal/Alertness: Awake/alert Behavior During Therapy: WFL for tasks assessed/performed Overall Cognitive Status: Within Functional  Limits for tasks assessed                                        Exercises Total Joint Exercises Ankle Circles/Pumps: AROM;Both;20 reps    General Comments        Pertinent Vitals/Pain Pain Assessment: 0-10 Pain Score: 7  Pain Location: L knee with therex Pain Descriptors / Indicators: Aching;Sore;Tightness Pain Intervention(s): Limited activity within patient's tolerance;Monitored during session;Premedicated before session;Ice applied    Home Living                      Prior Function            PT Goals (current goals can now be found in the care plan section) Acute Rehab PT Goals Patient Stated Goal: just get better and go home PT Goal Formulation: With patient Time For Goal Achievement: 02/19/17 Potential to Achieve Goals: Fair Progress towards PT goals: Progressing toward goals    Frequency    7X/week      PT Plan Current plan remains appropriate    Co-evaluation              AM-PAC PT "6 Clicks" Daily Activity  Outcome Measure  Difficulty turning over  in bed (including adjusting bedclothes, sheets and blankets)?: Unable Difficulty moving from lying on back to sitting on the side of the bed? : Unable Difficulty sitting down on and standing up from a chair with arms (e.g., wheelchair, bedside commode, etc,.)?: Unable Help needed moving to and from a bed to chair (including a wheelchair)?: A Lot Help needed walking in hospital room?: Total Help needed climbing 3-5 steps with a railing? : Total 6 Click Score: 7    End of Session Equipment Utilized During Treatment: Gait belt Activity Tolerance: Other (comment) (orthostatic) Patient left: with call bell/phone within reach;in chair Nurse Communication: Mobility status PT Visit Diagnosis: Difficulty in walking, not elsewhere classified (R26.2);Pain Pain - Right/Left: Left Pain - part of body: Knee     Time: 1455-1510 PT Time Calculation (min) (ACUTE ONLY): 15  min  Charges:  $Therapeutic Activity: 8-22 mins                    G Codes:       Pg [phone]    Abdoul Encinas 02/06/2017, 4:39 PM

## 2017-02-06 NOTE — Progress Notes (Signed)
Subjective:  He has had no recurrence of atrial fibrillation overnight on intravenous amiodarone.  Able to eat some liquids and passing flatus now.  Blood pressure elevated.  Objective:  Vital Signs in the last 24 hours: BP (!) 174/70 (BP Location: Right Arm)   Pulse 78   Temp 97.6 F (36.4 C) (Oral)   Resp 20   Ht 6\' 1"  (1.854 m)   Wt 104.1 kg (229 lb 8 oz)   SpO2 96%   BMI 30.28 kg/m   Physical Exam: Pleasant obese male in no acute distress Lungs:  Clear Cardiac:  Regular rhythm, normal S1 and S2, no S3, harsh 2/6 murmur of aortic stenosis radiating to neck Extremities:  No edema present  Intake/Output from previous day: 09/07 0701 - 09/08 0700 In: 1550.8 [I.V.:1550.8] Out: 1050 [Urine:750; Emesis/NG output:300]  Weight Filed Weights   01/25/17 0951 02/01/17 1330 02/03/17 0355  Weight: 104.8 kg (231 lb) 100.9 kg (222 lb 7.1 oz) 104.1 kg (229 lb 8 oz)    Lab Results: Basic Metabolic Panel:  Recent Labs  02/04/17 0306 02/05/17 0328  NA 138 135  K 3.7 3.5  CL 104 102  CO2 24 24  GLUCOSE 121* 119*  BUN 17 17  CREATININE 0.63 0.57*   CBC:  Recent Labs  02/05/17 0328 02/06/17 0747  WBC 18.1* 12.2*  HGB 11.3* 12.1*  HCT 34.2* 34.9*  MCV 87.0 87.3  PLT 549* PENDING   Telemetry: Sinus rhythm overnight.  Assessment/Plan:  1.  Postoperative atrial fibrillation currently in sinus rhythm 2.  Obesity 3.  Aortic stenosis-moderate  Recommendations:  Change digoxin and amiodarone to oral.  Might be able to discontinue amiodarone and on discharge as this was perioperative.  Restart blood pressure medicines if can take oral.    W. Doristine Church  MD [facility]  02/06/2017, 8:53 AM

## 2017-02-06 NOTE — Progress Notes (Signed)
Physical Therapy Treatment Patient Details Name: [name] MRN: [id number] DOB: 1951-03-24 Today's Date: 02/06/2017    History of Present Illness Pt is a 66 year old male s/p L TKA complicated by post op ileus and PAF converted on IV cardizem and amiodarone     PT Comments    Pt motivated but ltd by pain with therex and severe dizziness 2* orthostatic BP with attempts to mobilize   Follow Up Recommendations  DC plan and follow up therapy as arranged by surgeon;Supervision/Assistance - 24 hour     Equipment Recommendations  Rolling walker with 5" wheels    Recommendations for Other Services       Precautions / Restrictions Precautions Precautions: Fall;Knee Precaution Comments: able to perform IND SLR Required Braces or Orthoses: Knee Immobilizer - Left Knee Immobilizer - Left: Discontinue once straight leg raise with < 10 degree lag Restrictions Weight Bearing Restrictions: No Other Position/Activity Restrictions: WBAT    Mobility  Bed Mobility Overal bed mobility: Needs Assistance Bed Mobility: Sit to Supine     Supine to sit: Min assist Sit to supine: Min assist;Mod assist   General bed mobility comments: assist to manage Bil LE 2* severe dizziness  Transfers Overall transfer level: Needs assistance Equipment used: Rolling walker (2 wheeled) Transfers: Sit to/from [facility] Sit to Stand: Min assist;+2 safety/equipment Stand pivot transfers: Min assist;+2 safety/equipment       General transfer comment: cues for LE management and use of UEs to self assist.  Pt c/o dizziness  Ambulation/Gait Ambulation/Gait assistance: Min assist;+2 physical assistance;+2 safety/equipment Ambulation Distance (Feet): 3 Feet Assistive device: Rolling walker (2 wheeled) Gait Pattern/deviations: Step-to pattern;Antalgic Gait velocity: decreased    General Gait Details: cues for posture and position from RW   Stairs            Wheelchair  Mobility    Modified Rankin (Stroke Patients Only)       Balance                                            Cognition Arousal/Alertness: Awake/alert Behavior During Therapy: WFL for tasks assessed/performed Overall Cognitive Status: Within Functional Limits for tasks assessed                                        Exercises Total Joint Exercises Ankle Circles/Pumps: AROM;Both;10 reps;Supine Quad Sets: AROM;Strengthening;Left;15 reps;Supine Heel Slides: AAROM;Left;15 reps;Supine Heel Slides Limitations: pain limits flexion Straight Leg Raises: Left;AAROM;AROM;15 reps;Supine Goniometric ROM: -8 - 35 pain limited    General Comments        Pertinent Vitals/Pain Pain Assessment: 0-10 Pain Score: 7  Pain Location: L knee with therex Pain Descriptors / Indicators: Aching;Sore;Tightness Pain Intervention(s): Limited activity within patient's tolerance;Monitored during session;Premedicated before session;Ice applied    Home Living                      Prior Function            PT Goals (current goals can now be found in the care plan section) Acute Rehab PT Goals Patient Stated Goal: just get better and go home PT Goal Formulation: With patient Time For Goal Achievement: 02/19/17 Potential to Achieve Goals: Fair Progress towards PT goals: Progressing toward goals  Frequency    7X/week      PT Plan Current plan remains appropriate    Co-evaluation              AM-PAC PT "6 Clicks" Daily Activity  Outcome Measure  Difficulty turning over in bed (including adjusting bedclothes, sheets and blankets)?: Unable Difficulty moving from lying on back to sitting on the side of the bed? : Unable Difficulty sitting down on and standing up from a chair with arms (e.g., wheelchair, bedside commode, etc,.)?: Unable Help needed moving to and from a bed to chair (including a wheelchair)?: A Lot Help needed walking in  hospital room?: Total Help needed climbing 3-5 steps with a railing? : Total 6 Click Score: 7    End of Session Equipment Utilized During Treatment: Gait belt Activity Tolerance: Other (comment) (Pt orthostatic) Patient left: with call bell/phone within reach;in bed Nurse Communication: Mobility status PT Visit Diagnosis: Difficulty in walking, not elsewhere classified (R26.2);Pain Pain - Right/Left: Left Pain - part of body: Knee     Time: 1549-1630 PT Time Calculation (min) (ACUTE ONLY): 41 min  Charges:  $Therapeutic Exercise: 8-22 mins $Therapeutic Activity: 8-22 mins                    G Codes:       Pg [phone]    Stephanos Fan 02/06/2017, 5:06 PM

## 2017-02-06 NOTE — Progress Notes (Signed)
Subjective: 12 Days Post-Op Procedure(s) (LRB): LEFT TOTAL KNEE ARTHROPLASTY (Left)  Patient reports pain as mild to moderate.  Reports that he has been able to eat a little.  Admits to 2 BMs day before yesterday.  States that he is eager to go home.  Objective:   VITALS:  Temp:  [96.9 F (36.1 C)-98 F (36.7 C)] 97.6 F (36.4 C) (09/08 0420) Resp:  [10-32] 18 (09/08 0600) BP: (159-195)/(65-86) 159/71 (09/08 0600) SpO2:  [96 %-100 %] 98 % (09/08 0729)  General: WDWN patient in NAD. Psych:  Appropriate mood and affect. Neuro:  A&O x 3, Moving all extremities, sensation intact to light touch HEENT:  EOMs intact Chest:  Even non-labored respirations Skin:  Incision C/D/I, large serous blisters are along the distal medial incision site.  Degaderm intact. Extremities: warm/dry, moderate edema, mild erythema, no echymosis.  No lymphadenopathy. Pulses: Popliteus 2+ MSK:  ROM: 5 degrees shy of TKE, MMT: patient is able to perform quad set, (-) Homan's    LABS  Recent Labs  02/04/17 0306 02/05/17 0328  HGB 13.1 11.3*  WBC 23.3* 18.1*  PLT 577* 549*    Recent Labs  02/04/17 0306 02/05/17 0328  NA 138 135  K 3.7 3.5  CL 104 102  CO2 24 24  BUN 17 17  CREATININE 0.63 0.57*  GLUCOSE 121* 119*   No results for input(s): LABPT, INR in the last 72 hours.   Assessment/Plan: 12 Days Post-Op Procedure(s) (LRB): LEFT TOTAL KNEE ARTHROPLASTY (Left)  Patient seen in rounds for Dr. Wynelle Link Continue with tegaderm dressings.  If blisters rupture utilize dry dressings. May resume therapy once patient is deemed stable by medical/cardiac teams. Continue knee flex/ext ROM exercises in bed. Lovenox for DVT prophylaxis WBAT L LE  Mechele Claude, PA-C [place] Office:  [phone]

## 2017-02-06 NOTE — Progress Notes (Signed)
ANTICOAGULATION CONSULT NOTE - Initial Consult  Pharmacy Consult for xarelto Indication: atrial fibrillation  Allergies  Allergen Reactions  . Bee Venom Anaphylaxis  . Codeine Shortness Of Breath and Rash  . Doxycycline Shortness Of Breath and Rash  . Ivp Dye [Iodinated Diagnostic Agents] Shortness Of Breath    Lost consciousness/difficulty breathing [facility] - Paque Contrast   . Levaquin [Levofloxacin] Hives and Shortness Of Breath  . Oxycodone Hcl Shortness Of Breath, Swelling and Rash  . Stadol [Butorphanol] Shortness Of Breath  . Succinylcholine Chloride Shortness Of Breath, Nausea Only, Rash and Other (See Comments)    Difficulty breathing   . Atorvastatin     myalgias  . Bystolic [Nebivolol Hcl] Other (See Comments)    Nightmares, flashbacks (PTSD)  . Iohexol      Desc: hives,neck and torso erythemia   . Methocarbamol Nausea Only    rash  . Pentazocine Lactate     rash  . Dilaudid [Hydromorphone Hcl] Nausea Only and Rash    Aggressive   . Synvisc [Hylan G-F 20] Hives and Rash    Patient Measurements: Height: 6\' 1"  (185.4 cm) Weight: 229 lb 8 oz (104.1 kg) IBW/kg (Calculated) : 79.9  Vital Signs: Temp: 97.6 F (36.4 C) (09/08 0750) Temp Source: Oral (09/08 0750) BP: 174/70 (09/08 0805)  Labs:  Recent Labs  02/04/17 0306 02/05/17 0328 02/06/17 0747  HGB 13.1 11.3* 12.1*  HCT 38.1* 34.2* 34.9*  PLT 577* 549* PENDING  CREATININE 0.63 0.57* 0.63    Estimated Creatinine Clearance: 115.1 mL/min (by C-G formula based on SCr of 0.63 mg/dL).   Medical History: Past Medical History:  Diagnosis Date  . Anemia   . Asthma   . Back pain, chronic, followed at pain clinic 11/25/2011  . Carotid artery stenosis 09/06/2014  . Chest pain with low risk for cardiac etiology 10/2011   Non-obstructive CAD by Cath; negative Lexiscan in 08/2011  . Clotting disorder (HCC)    LLE DVT (also small vessel)  . Colon polyps   . COPD (chronic obstructive pulmonary disease) ([place])    . Dementia    Mild  . Depression   . Dyslipidemia (high LDL; low HDL)    statin intolerant; on fibrate  . Edema leg    Wears compression stockings; 08/2012 dopplers - no DVT or thrombophlebitis; mild R Popliteal V reflux - no VNUS ablation candidate  . Fainting   . Family history of acute myocardial infarction. and premature CAD 11/25/2011  . GERD (gastroesophageal reflux disease)   . Hearing loss   . History of diabetes mellitus   . Hypertension    very labile  . Mild aortic stenosis by prior echocardiogram 03/2012   moderate sclerosis  . Nephrolithiasis   . Osteoarthritis   . Other idiopathic peripheral autonomic neuropathy    Agent orange  . PTSD (post-traumatic stress disorder) 07/24/2015   per patient approach from foot of bed to awake; do not apply any contricting pressure, also avoid approaching from behind with any loud noises   . Seizure ([place])   . Small bowel obstruction ([place]) 1990s, 2001, 2015   s/p multiple bowel surgeries; from war wounds  . Stroke ([place])   . Weakness      Assessment:  66 yo man POD # 12 s/p L TKR complicated by post-op ileus and PAF. He was on xarelto 10 mg qday s/p L TKR 8/27>>LMWH 30 bid>>LMWH 100 bid 9/3 for PAF.  Now to transition to xarelto for PAF since ileus has  resolved. Last dose LMWH 100 mg 9/7 2137.  CBC stable, Cr OK, no bleeding reported.   Goal of Therapy:  Stroke and VTE prevention Monitor platelets by anticoagulation protocol: Yes   Plan:  xarelto 20 mg q supper for PAF dosing Pharmacy to sign off Eudelia Bunch, Pharm.D. [phone] 02/06/2017 10:27 AM

## 2017-02-06 NOTE — Progress Notes (Signed)
PROGRESS NOTE  [name] RCV:[id number] DOB: 20-Jan-1951 DOA: 01/25/2017 PCP: Remi Haggard, FNP   LOS: 12 days   Brief Narrative / Interim history: 66 year old male with history of type 2 diabetes mellitus, hyperlipidemia, hypertension, COPD, CAD, who was admitted on orthopedic surgery service with left knee osteoarthritis and underwent left total knee arthroplasty on 01/25/2017. Patient has been progressively nauseous and has been complaining of abdominal distention over the last 24 hours, this was associated with orthostatic hypotension requiring several IV fluid boluses. He denies any chest pain or palpitations. He denies any fevers or chills. He does have a cough with minimal sputum production, and that is somewhat chronic. He reports he has not had a bowel movement in several days, however he is passing gas. He has a history of intermittent chest pains for which she was admitted on cardiology service in May 2018, underwent a cardiac catheterization which was negative for significant coronary artery disease. He also has COPD followed by Dr. Annamaria Boots with pulmonology (patient never smoked). He does have history of extensive intra-abdominal surgery (appendicectomy, Bilroth II), and has had an episode of SBO in 2015.   Assessment & Plan: Principal Problem:   Adynamic ileus ([place]) Active Problems:   Essential hypertension   OA (osteoarthritis) of knee   Mild aortic stenosis by prior echocardiogram   H/O major abdominal surgery   COPD mixed type (HCC)   Abdominal pain, nausea vomiting secondary to post-op ileus -NG tube was placed on 8/31, patient improved and his diet was advanced, however on 9/5 he has been having worsening nausea as well as abdominal distention, requiring change back to n.p.o. Status.  General surgery was consulted at that time and are currently following patient -tolerated NG tube clamping, no NG tube is discontinued.  Advance diet.  COPD -Stable, no  wheezing on exam, continue his home medications  Hypertension -Persistently hypertensive, resume home p.o. meds today given he is tolerating diet  Atrial fibrillation with RVR -patient's CHA2DS2-VASc Score for Stroke Risk is 4 -Patient developed atrial fibrillation with RVR requiring transfer to stepdown.  Cardiology consulted.  -Amiodarone converted to p.o., defer to cardiology whether this need to be continued on discharge. -Transition to Xarelto  Chest pain /pressure concern for coronary artery disease -recent cardiac catheterization 3 months ago negative for significant coronary artery disease. -Most recent 2D echo October 2017 showed normal EF 60-65%, mild aortic stenosis and mild regurgitation -Troponin slightly elevated due to demand ischemia  Osteoarthritis -pt is s/p L knee arthoplasty orthopedic surgery following,   ARF -Labs reviewed. Cr has normalized -Stop fluids, advance diet   DVT prophylaxis: Lovenox Code Status: Full code Family Communication: no family in the room, discussed with wife over the phone 9/5 Disposition Plan: TBD  Consultants:   Cardiology  General surgery  Procedures:   2D echo Study Conclusions - Left ventricle: The cavity size was normal. Wall thickness was increased in a pattern of moderate LVH. Systolic function was vigorous. The estimated ejection fraction was in the range of 65% to 70%. There was dynamic obstruction at rest, with a peak velocity of 210 cm/sec and a peak gradient of 18 mm Hg. Wall motion was normal; there were no regional wall motion abnormalities. - Aortic valve: Valve mobility was restricted, worse in the non-coronary cusp. There was moderate stenosis. There was mild regurgitation. Peak velocity (S): 340 cm/s. Mean gradient (S): 25 mm Hg. Valve area (VTI): 1.34 cm^2. Valve area (Vmax): 1.39 cm^2. Valve area (Vmean): 1.38 cm^2. -  Mitral valve: Transvalvular velocity was within the normal range. There was no evidence for  stenosis. There was no regurgitation. - Left atrium: The atrium was mildly dilated. - Right ventricle: The cavity size was normal. Wall thickness was normal. Systolic function was normal. - Tricuspid valve: There was trivial regurgitation.  Antimicrobials:  None   Subjective: - no chest pain, shortness of breath, no abdominal pain, nausea or vomiting. Feels a lot better  Objective: Vitals:   02/06/17 0700 02/06/17 0729 02/06/17 0750 02/06/17 0805  BP: (!) 170/73   (!) 174/70  Pulse:      Resp: 18   20  Temp:   97.6 F (36.4 C)   TempSrc:   Oral   SpO2: 94% 98%  96%  Weight:      Height:        Intake/Output Summary (Last 24 hours) at 02/06/17 0954 Last data filed at 02/06/17 0805  Gross per 24 hour  Intake          1489.66 ml  Output             1050 ml  Net           439.66 ml   Filed Weights   01/25/17 0951 02/01/17 1330 02/03/17 0355  Weight: 104.8 kg (231 lb) 100.9 kg (222 lb 7.1 oz) 104.1 kg (229 lb 8 oz)    Examination:  Vitals:   02/06/17 0700 02/06/17 0729 02/06/17 0750 02/06/17 0805  BP: (!) 170/73   (!) 174/70  Pulse:      Resp: 18   20  Temp:   97.6 F (36.4 C)   TempSrc:   Oral   SpO2: 94% 98%  96%  Weight:      Height:       Constitutional: NAD, calm, comfortable Eyes: lids and conjunctivae normal ENMT: Mucous membranes are moist.  Respiratory: clear to auscultation bilaterally, no wheezing, no crackles. Normal respiratory effort.  Cardiovascular: Regular rate and rhythm, 3/6 SEM. No LE edema. 2+ pedal pulses.  Abdomen: no tenderness. Bowel sounds positive.  Neurologic: non focal    Data Reviewed: I have independently reviewed following labs and imaging studies  CBC:  Recent Labs Lab 02/01/17 0424 02/02/17 0512 02/04/17 0306 02/05/17 0328 02/06/17 0747  WBC 15.3* 17.8* 23.3* 18.1* 12.2*  HGB 11.2* 11.3* 13.1 11.3* 12.1*  HCT 32.2* 33.1* 38.1* 34.2* 34.9*  MCV 86.6 85.3 86.6 87.0 87.3  PLT 418* 530* 577* 549* PENDING   Basic  Metabolic Panel:  Recent Labs Lab 02/01/17 1050 02/02/17 0512 02/03/17 0319 02/04/17 0306 02/05/17 0328 02/06/17 0747  NA  --  139 137 138 135 132*  K  --  3.1* 3.5 3.7 3.5 3.2*  CL  --  103 103 104 102 98*  CO2  --  26 25 24 24 25   GLUCOSE  --  167* 142* 121* 119* 108*  BUN  --  17 15 17 17 17   CREATININE  --  0.63 0.64 0.63 0.57* 0.63  CALCIUM  --  8.8* 8.9 8.9 8.7* 8.8*  MG 2.5*  --   --   --   --   --    GFR: Estimated Creatinine Clearance: 115.1 mL/min (by C-G formula based on SCr of 0.63 mg/dL). Liver Function Tests:  Recent Labs Lab 01/31/17 0504 02/05/17 0328  AST 23 51*  ALT 29 73*  ALKPHOS 74 83  BILITOT 2.0* 1.5*  PROT 7.2 6.0*  ALBUMIN 3.2* 2.7*   No results  for input(s): LIPASE, AMYLASE in the last 168 hours. No results for input(s): AMMONIA in the last 168 hours. Coagulation Profile: No results for input(s): INR, PROTIME in the last 168 hours. Cardiac Enzymes:  Recent Labs Lab 02/01/17 1734 02/01/17 2316 02/02/17 0512 02/02/17 1057 02/02/17 1952  TROPONINI 0.22* 0.27* 0.22* 0.14* 0.14*   BNP (last 3 results) No results for input(s): PROBNP in the last 8760 hours. HbA1C: No results for input(s): HGBA1C in the last 72 hours. CBG: No results for input(s): GLUCAP in the last 168 hours. Lipid Profile: No results for input(s): CHOL, HDL, LDLCALC, TRIG, CHOLHDL, LDLDIRECT in the last 72 hours. Thyroid Function Tests: No results for input(s): TSH, T4TOTAL, FREET4, T3FREE, THYROIDAB in the last 72 hours. Anemia Panel: No results for input(s): VITAMINB12, FOLATE, FERRITIN, TIBC, IRON, RETICCTPCT in the last 72 hours. Urine analysis:    Component Value Date/Time   COLORURINE ORANGE (A) 01/16/2014 1146   APPEARANCEUR TURBID (A) 01/16/2014 1146   LABSPEC 1.031 (H) 01/16/2014 1146   PHURINE 5.5 01/16/2014 1146   GLUCOSEU NEGATIVE 01/16/2014 1146   GLUCOSEU NEGATIVE 02/02/2007 0936   HGBUR NEGATIVE 01/16/2014 1146   BILIRUBINUR negative  12/10/2015 1503   KETONESUR 15 (A) 01/16/2014 1146   PROTEINUR negative 12/10/2015 1503   PROTEINUR NEGATIVE 01/16/2014 1146   UROBILINOGEN 2.0 12/10/2015 1503   UROBILINOGEN 1.0 01/16/2014 1146   NITRITE negative 12/10/2015 1503   NITRITE NEGATIVE 01/16/2014 1146   LEUKOCYTESUR Negative 12/10/2015 1503   Sepsis Labs: Invalid input(s): PROCALCITONIN, LACTICIDVEN  Recent Results (from the past 240 hour(s))  MRSA PCR Screening     Status: None   Collection Time: 02/01/17  1:46 PM  Result Value Ref Range Status   MRSA by PCR NEGATIVE NEGATIVE Final    Comment:        The GeneXpert MRSA Assay (FDA approved for NASAL specimens only), is one component of a comprehensive MRSA colonization surveillance program. It is not intended to diagnose MRSA infection nor to guide or monitor treatment for MRSA infections.       Radiology Studies: No results found.   Scheduled Meds: . albuterol  2.5 mg Nebulization BID  . amiodarone  200 mg Oral BID  . bisacodyl  10 mg Rectal Daily  . carvedilol  25 mg Oral BID WC  . digoxin  0.25 mg Oral Daily  . diltiazem  60 mg Oral Q8H  . docusate sodium  100 mg Oral BID  . enoxaparin (LOVENOX) injection  100 mg Subcutaneous Q12H  . fluticasone  1 spray Each Nare Daily  . fluticasone furoate-vilanterol  1 puff Inhalation Daily  . irbesartan  300 mg Oral Daily   And  . hydrochlorothiazide  12.5 mg Oral Daily  . Naldemedine Tosylate  0.2 mg Oral Q1200   Continuous Infusions: . sodium chloride 50 mL/hr at 02/06/17 0600  . dilTIAZem HCl-Dextrose Stopped (02/03/17 0119)  . methocarbamol (ROBAXIN)  IV Stopped (02/02/17 2142)    Marzetta Board, MD, PhD [facility] Pager [phone]  If 7PM-7AM, please contact night-coverage [email or web address] Password [facility] 02/06/2017, 9:54 AM

## 2017-02-07 ENCOUNTER — Inpatient Hospital Stay (HOSPITAL_COMMUNITY): Payer: Medicare Other

## 2017-02-07 DIAGNOSIS — R6510 Systemic inflammatory response syndrome (SIRS) of non-infectious origin without acute organ dysfunction: Secondary | ICD-10-CM

## 2017-02-07 LAB — CBC
HEMATOCRIT: 35.3 % — AB (ref 39.0–52.0)
HEMOGLOBIN: 11.8 g/dL — AB (ref 13.0–17.0)
MCH: 29.3 pg (ref 26.0–34.0)
MCHC: 33.4 g/dL (ref 30.0–36.0)
MCV: 87.6 fL (ref 78.0–100.0)
Platelets: 592 10*3/uL — ABNORMAL HIGH (ref 150–400)
RBC: 4.03 MIL/uL — AB (ref 4.22–5.81)
RDW: 13.6 % (ref 11.5–15.5)
WBC: 21.7 10*3/uL — ABNORMAL HIGH (ref 4.0–10.5)

## 2017-02-07 LAB — BASIC METABOLIC PANEL
Anion gap: 9 (ref 5–15)
BUN: 14 mg/dL (ref 6–20)
CALCIUM: 9 mg/dL (ref 8.9–10.3)
CO2: 29 mmol/L (ref 22–32)
Chloride: 96 mmol/L — ABNORMAL LOW (ref 101–111)
Creatinine, Ser: 0.62 mg/dL (ref 0.61–1.24)
Glucose, Bld: 127 mg/dL — ABNORMAL HIGH (ref 65–99)
POTASSIUM: 4.4 mmol/L (ref 3.5–5.1)
Sodium: 134 mmol/L — ABNORMAL LOW (ref 135–145)

## 2017-02-07 LAB — LACTIC ACID, PLASMA: LACTIC ACID, VENOUS: 1.1 mmol/L (ref 0.5–1.9)

## 2017-02-07 MED ORDER — PIPERACILLIN-TAZOBACTAM 3.375 G IVPB
3.3750 g | Freq: Three times a day (TID) | INTRAVENOUS | Status: DC
  Administered 2017-02-07 – 2017-02-09 (×6): 3.375 g via INTRAVENOUS
  Filled 2017-02-07 (×6): qty 50

## 2017-02-07 MED ORDER — CEPHALEXIN 500 MG PO CAPS: 500.0000 mg | ORAL_CAPSULE | Freq: Four times a day (QID) | ORAL | Status: DC

## 2017-02-07 MED ORDER — IRBESARTAN 150 MG PO TABS
150.0000 mg | ORAL_TABLET | Freq: Every day | ORAL | Status: DC
  Filled 2017-02-07: qty 1

## 2017-02-07 MED ORDER — SODIUM CHLORIDE 0.9 % IV BOLUS (SEPSIS)
500.0000 mL | Freq: Once | INTRAVENOUS | Status: AC
  Administered 2017-02-07: 500 mL via INTRAVENOUS

## 2017-02-07 MED ORDER — SODIUM CHLORIDE 0.9 % IV SOLN
INTRAVENOUS | Status: AC
  Administered 2017-02-07 – 2017-02-08 (×2): via INTRAVENOUS

## 2017-02-07 NOTE — Progress Notes (Signed)
I spoke with Dr. Cruzita Lederer regarding PICC placement prior to blood culture results negative x 48 hrs.  States doubtful to be positive and okay to place PICC today due to potential for transfer to stepdown and needed for labs.

## 2017-02-07 NOTE — Progress Notes (Signed)
Spoke with pt regarding PICC placement.  When reviewing risk of infection, pt stated he had MRSA from a "PICC in the past and was sent home on hospice to die from it". He verbalized concern about the WBC being elevated and blood cultures pending. He states he does not want the PICC until results are back.  Emphasized to pt that one of the main reasons the PICC is needed is for labwork.  He verbalizes understanding.  Also explained that Dr Cruzita Lederer is concerned for him and potential decline tonight- verbalizes understanding.  Also verbalizes understanding that treatment is limited due to no labwork available. Dr Cruzita Lederer notified that pt pleasantly refused PICC with stated reasons.  Pt is open to having PICC placed at another time.  Please reorder PICC once pt is agreeable.  Thank you for the referral.

## 2017-02-07 NOTE — Progress Notes (Signed)
     Subjective: 13 Days Post-Op Procedure(s) (LRB): LEFT TOTAL KNEE ARTHROPLASTY (Left)   Patient reports pain as mild/moderate.  He does have a large blister over the distal area of the incision, still covered by the Tegaderm. He mentioned that he did get up with PT yesterday before his BP dropped.   Objective:   VITALS:   Vitals:   02/07/17 0409 02/07/17 0619  BP: (!) 171/86 (!) 164/75  Pulse: 75   Resp: 18   Temp: 98.4 F (36.9 C)   SpO2: 99%     Dorsiflexion/Plantar flexion intact Incision: dressing C/D/I, no drainage and large blistercovering the distal portion of the incision and along the incision line. No cellulitis present Compartment soft  LABS  Recent Labs  02/05/17 0328 02/06/17 0747 02/07/17 0450  HGB 11.3* 12.1* 11.8*  HCT 34.2* 34.9* 35.3*  WBC 18.1* 12.2* 21.7*  PLT 549* 287 592*     Recent Labs  02/05/17 0328 02/06/17 0747 02/07/17 0450  NA 135 132* 134*  K 3.5 3.2* 4.4  BUN 17 17 14   CREATININE 0.57* 0.63 0.62  GLUCOSE 119* 108* 127*     Assessment/Plan: 13 Days Post-Op Procedure(s) (LRB): LEFT TOTAL KNEE ARTHROPLASTY (Left)   Up with therapy Continue with tegaderm dressings.  If blisters rupture utilize dry dressings. May resume therapy once patient is deemed stable. Continue knee flex/ext ROM exercises in bed. Plan on allowing Dr. Wynelle Link to see the incision site tomorrow.   [name]   PAC  02/07/2017, 8:51 AM

## 2017-02-07 NOTE — Progress Notes (Addendum)
PROGRESS NOTE  [name] YPP:[id number] DOB: January 28, 1951 DOA: 01/25/2017 PCP: Remi Haggard, FNP   LOS: 13 days   Brief Narrative / Interim history: 66 year old male with history of type 2 diabetes mellitus, hyperlipidemia, hypertension, COPD, CAD, who was admitted on orthopedic surgery service with left knee osteoarthritis and underwent left total knee arthroplasty on 01/25/2017. Patient has been progressively nauseous and has been complaining of abdominal distention over the last 24 hours, this was associated with orthostatic hypotension requiring several IV fluid boluses. He denies any chest pain or palpitations. He denies any fevers or chills. He does have a cough with minimal sputum production, and that is somewhat chronic. He reports he has not had a bowel movement in several days, however he is passing gas. He has a history of intermittent chest pains for which she was admitted on cardiology service in May 2018, underwent a cardiac catheterization which was negative for significant coronary artery disease. He also has COPD followed by Dr. Annamaria Boots with pulmonology (patient never smoked). He does have history of extensive intra-abdominal surgery (appendicectomy, Bilroth II), and has had an episode of SBO in 2015.   Assessment & Plan: Principal Problem:   Adynamic ileus ([place]) Active Problems:   Essential hypertension   OA (osteoarthritis) of knee   Mild aortic stenosis by prior echocardiogram   H/O major abdominal surgery   COPD mixed type (HCC)   Abdominal pain, nausea vomiting secondary to post-op ileus -NG tube was placed on 8/31, patient improved and his diet was advanced, however on 9/5 he has been having worsening nausea as well as abdominal distention, requiring change back to n.p.o. Status.  General surgery was consulted at that time and are currently following patient -tolerated NG tube clamping, no NG tube is discontinued.  Advance diet. -He was increasingly  nauseous last night, did not really want to eat any breakfast this morning out of fear of getting worse again. -Repeat abdominal x-ray this morning as he appears somewhat distended.  He is passing gas however has not had a bowel movement in couple of days  COPD -Stable, no wheezing on exam, continue his home medications -We will update a chest x-ray today given hypotension  Concern for SIRS -Patient hypotensive today, increased in white count, his right wrist looks worse and appears to have cellulitis and spreading, left knee looks about the same.  Obtain cultures, obtain urinalysis, obtain chest x-ray abdominal x-ray, empiric Zosyn.  Obtain lactic acid  -his hypotension could be caused by recent BP meds, closely monitor BP / symptoms. BOlus 500 cc for now   Hypotension with history of hypertension -Patient hypotensive last night, I have discontinued his ARB this morning.  Again hypotensive today into the 90s, symptomatic and complaining of lightheadedness.  Given leukocytosis,?  Early sepsis  Atrial fibrillation with RVR -patient's CHA2DS2-VASc Score for Stroke Risk is 4 -Patient developed atrial fibrillation with RVR requiring transfer to stepdown.  Cardiology consulted.  -Amiodarone converted to p.o., defer to cardiology whether this need to be continued on discharge. -Transition to Xarelto  Chest pain /pressure concern for coronary artery disease -recent cardiac catheterization 3 months ago negative for significant coronary artery disease. -Most recent 2D echo October 2017 showed normal EF 60-65%, mild aortic stenosis and mild regurgitation -Troponin slightly elevated due to demand ischemia  Osteoarthritis -pt is s/p L knee arthoplasty orthopedic surgery following,   ARF -Labs reviewed. Cr has normalized -Stop fluids, advance diet   DVT prophylaxis: Lovenox Code Status: Full code  Family Communication: no family in the room Disposition Plan: TBD  Consultants:    Cardiology  General surgery  Procedures:   2D echo Study Conclusions - Left ventricle: The cavity size was normal. Wall thickness was increased in a pattern of moderate LVH. Systolic function was vigorous. The estimated ejection fraction was in the range of 65% to 70%. There was dynamic obstruction at rest, with a peak velocity of 210 cm/sec and a peak gradient of 18 mm Hg. Wall motion was normal; there were no regional wall motion abnormalities. - Aortic valve: Valve mobility was restricted, worse in the non-coronary cusp. There was moderate stenosis. There was mild regurgitation. Peak velocity (S): 340 cm/s. Mean gradient (S): 25 mm Hg. Valve area (VTI): 1.34 cm^2. Valve area (Vmax): 1.39 cm^2. Valve area (Vmean): 1.38 cm^2. - Mitral valve: Transvalvular velocity was within the normal range. There was no evidence for stenosis. There was no regurgitation. - Left atrium: The atrium was mildly dilated. - Right ventricle: The cavity size was normal. Wall thickness was normal. Systolic function was normal. - Tricuspid valve: There was trivial regurgitation.  Antimicrobials:  None   Subjective: -He is feeling lightheaded, patient seen after his hypotensive episode, afraid to eat  Objective: Vitals:   02/07/17 0845 02/07/17 1124 02/07/17 1143 02/07/17 1159  BP: 124/64 (!) 83/47 (!) 96/59   Pulse: 79 72 77 72  Resp: 20 (!) 24    Temp: 97.9 F (36.6 C)     TempSrc: Oral     SpO2: 97% 95%    Weight:      Height:        Intake/Output Summary (Last 24 hours) at 02/07/17 1205 Last data filed at 02/07/17 0400  Gross per 24 hour  Intake                0 ml  Output              500 ml  Net             -500 ml   Filed Weights   01/25/17 0951 02/01/17 1330 02/03/17 0355  Weight: 104.8 kg (231 lb) 100.9 kg (222 lb 7.1 oz) 104.1 kg (229 lb 8 oz)    Examination:  Vitals:   02/07/17 0845 02/07/17 1124 02/07/17 1143 02/07/17 1159  BP: 124/64 (!) 83/47 (!) 96/59   Pulse: 79 72 77  72  Resp: 20 (!) 24    Temp: 97.9 F (36.6 C)     TempSrc: Oral     SpO2: 97% 95%    Weight:      Height:       Constitutional: NAD, calm, comfortable Eyes: lids and conjunctivae normal ENMT: Mucous membranes are moist.  Respiratory: clear to auscultation bilaterally, no wheezing, no crackles. Normal respiratory effort.  Cardiovascular: Regular rate and rhythm, 3/6 SEM. No LE edema. 2+ pedal pulses.  Abdomen: Mildly distended, no tenderness. Bowel sounds diminished.  Skin: Left knee with couple of fluid-filled blisters, right wrist with developing cellulitis Neurologic: non focal    Data Reviewed: I have independently reviewed following labs and imaging studies  CBC:  Recent Labs Lab 02/02/17 0512 02/04/17 0306 02/05/17 0328 02/06/17 0747 02/07/17 0450  WBC 17.8* 23.3* 18.1* 12.2* 21.7*  HGB 11.3* 13.1 11.3* 12.1* 11.8*  HCT 33.1* 38.1* 34.2* 34.9* 35.3*  MCV 85.3 86.6 87.0 87.3 87.6  PLT 530* 577* 549* 287 782*   Basic Metabolic Panel:  Recent Labs Lab 02/01/17 1050  02/03/17 0319  02/04/17 0306 02/05/17 0328 02/06/17 0747 02/07/17 0450  NA  --   < > 137 138 135 132* 134*  K  --   < > 3.5 3.7 3.5 3.2* 4.4  CL  --   < > 103 104 102 98* 96*  CO2  --   < > 25 24 24 25 29   GLUCOSE  --   < > 142* 121* 119* 108* 127*  BUN  --   < > 15 17 17 17 14   CREATININE  --   < > 0.64 0.63 0.57* 0.63 0.62  CALCIUM  --   < > 8.9 8.9 8.7* 8.8* 9.0  MG 2.5*  --   --   --   --   --   --   < > = values in this interval not displayed. GFR: Estimated Creatinine Clearance: 115.1 mL/min (by C-G formula based on SCr of 0.62 mg/dL). Liver Function Tests:  Recent Labs Lab 02/05/17 0328  AST 51*  ALT 73*  ALKPHOS 83  BILITOT 1.5*  PROT 6.0*  ALBUMIN 2.7*   No results for input(s): LIPASE, AMYLASE in the last 168 hours. No results for input(s): AMMONIA in the last 168 hours. Coagulation Profile: No results for input(s): INR, PROTIME in the last 168 hours. Cardiac  Enzymes:  Recent Labs Lab 02/01/17 1734 02/01/17 2316 02/02/17 0512 02/02/17 1057 02/02/17 1952  TROPONINI 0.22* 0.27* 0.22* 0.14* 0.14*   BNP (last 3 results) No results for input(s): PROBNP in the last 8760 hours. HbA1C: No results for input(s): HGBA1C in the last 72 hours. CBG: No results for input(s): GLUCAP in the last 168 hours. Lipid Profile: No results for input(s): CHOL, HDL, LDLCALC, TRIG, CHOLHDL, LDLDIRECT in the last 72 hours. Thyroid Function Tests: No results for input(s): TSH, T4TOTAL, FREET4, T3FREE, THYROIDAB in the last 72 hours. Anemia Panel: No results for input(s): VITAMINB12, FOLATE, FERRITIN, TIBC, IRON, RETICCTPCT in the last 72 hours. Urine analysis:    Component Value Date/Time   COLORURINE ORANGE (A) 01/16/2014 1146   APPEARANCEUR TURBID (A) 01/16/2014 1146   LABSPEC 1.031 (H) 01/16/2014 1146   PHURINE 5.5 01/16/2014 1146   GLUCOSEU NEGATIVE 01/16/2014 1146   GLUCOSEU NEGATIVE 02/02/2007 0936   HGBUR NEGATIVE 01/16/2014 1146   BILIRUBINUR negative 12/10/2015 1503   KETONESUR 15 (A) 01/16/2014 1146   PROTEINUR negative 12/10/2015 1503   PROTEINUR NEGATIVE 01/16/2014 1146   UROBILINOGEN 2.0 12/10/2015 1503   UROBILINOGEN 1.0 01/16/2014 1146   NITRITE negative 12/10/2015 1503   NITRITE NEGATIVE 01/16/2014 1146   LEUKOCYTESUR Negative 12/10/2015 1503   Sepsis Labs: Invalid input(s): PROCALCITONIN, LACTICIDVEN  Recent Results (from the past 240 hour(s))  MRSA PCR Screening     Status: None   Collection Time: 02/01/17  1:46 PM  Result Value Ref Range Status   MRSA by PCR NEGATIVE NEGATIVE Final    Comment:        The GeneXpert MRSA Assay (FDA approved for NASAL specimens only), is one component of a comprehensive MRSA colonization surveillance program. It is not intended to diagnose MRSA infection nor to guide or monitor treatment for MRSA infections.       Radiology Studies: No results found.   Scheduled Meds: . albuterol   2.5 mg Nebulization BID  . amiodarone  200 mg Oral BID  . bisacodyl  10 mg Rectal Daily  . carvedilol  25 mg Oral BID WC  . digoxin  0.25 mg Oral Daily  . diltiazem  60 mg  Oral Q8H  . docusate sodium  100 mg Oral BID  . fluticasone  1 spray Each Nare Daily  . fluticasone furoate-vilanterol  1 puff Inhalation Daily  . Naldemedine Tosylate  0.2 mg Oral Q1200  . rivaroxaban  20 mg Oral Q supper   Continuous Infusions: . methocarbamol (ROBAXIN)  IV Stopped (02/02/17 2142)  . piperacillin-tazobactam (ZOSYN)  IV      Marzetta Board, MD, PhD [facility] Pager [phone]  If 7PM-7AM, please contact night-coverage [email or web address] Password [email or web address] 02/07/2017, 12:05 PM

## 2017-02-07 NOTE — Progress Notes (Signed)
Physical Therapy Treatment Patient Details Name: [name] MRN: [id number] DOB: [birth year] Today's Date: 02/07/2017    History of Present Illness L TKR 01/25/17 post op Ileus and A Fib    PT Comments    Assisted pt to EOB reports mild dizziness.  BP sitting EOB 107/54.  Assisted with amb to bathroom MinGuard/Supervision level.  No void or BM just a lot of gas.  Assisted with hygiene.  Sit to stand several times due to fatigue.  Assisted with amb from bathroom to recliner.  Pt requested to go back to bed and required positive encouragement to go to recliner.    Follow Up Recommendations  DC plan and follow up therapy as arranged by surgeon;Supervision/Assistance - 24 hour     Equipment Recommendations       Recommendations for Other Services       Precautions / Restrictions Precautions Precautions: Fall;Knee Precaution Comments: able to perform IND SLR Restrictions Weight Bearing Restrictions: No Other Position/Activity Restrictions: WBAT    Mobility  Bed Mobility Overal bed mobility: Needs Assistance Bed Mobility: Supine to Sit     Supine to sit: Min guard;Supervision     General bed mobility comments: able to self perform with increased time and use of rail  Transfers Overall transfer level: Needs assistance Equipment used: Rolling walker (2 wheeled) Transfers: Sit to/from [facility] Sit to Stand: Supervision;Min guard Stand pivot transfers: Supervision;Min guard       General transfer comment: 25% VC's on proper hand placement esp with stand to sit.  Assisted on/off raised toilet.  Mild c/o dizziness.    Ambulation/Gait Ambulation/Gait assistance: Supervision;Min guard Ambulation Distance (Feet): 22 Feet (11 feet x 2 to and from bathroom) Assistive device: Rolling walker (2 wheeled) Gait Pattern/deviations: Step-to pattern;Antalgic Gait velocity: decreased    General Gait Details: cues for posture and position from RW esp with turns.   Amb to and from bathroom.   Stairs            Wheelchair Mobility    Modified Rankin (Stroke Patients Only)       Balance                                            Cognition Arousal/Alertness: Awake/alert Behavior During Therapy: WFL for tasks assessed/performed Overall Cognitive Status: Within Functional Limits for tasks assessed                                 General Comments: feels "tired" and slighly "dizzy"       Exercises      General Comments        Pertinent Vitals/Pain Pain Assessment: No/denies pain    Home Living                      Prior Function            PT Goals (current goals can now be found in the care plan section) Progress towards PT goals: Progressing toward goals    Frequency    7X/week      PT Plan Current plan remains appropriate    Co-evaluation              AM-PAC PT "6 Clicks" Daily Activity  Outcome Measure  Difficulty turning over in bed (  including adjusting bedclothes, sheets and blankets)?: A Lot Difficulty moving from lying on back to sitting on the side of the bed? : A Lot Difficulty sitting down on and standing up from a chair with arms (e.g., wheelchair, bedside commode, etc,.)?: A Lot Help needed moving to and from a bed to chair (including a wheelchair)?: A Lot Help needed walking in hospital room?: A Lot Help needed climbing 3-5 steps with a railing? : A Lot 6 Click Score: 12    End of Session Equipment Utilized During Treatment: Gait belt Activity Tolerance: Patient tolerated treatment well;Other (comment) (mild dizziness) Patient left: with call bell/phone within reach;in chair Nurse Communication: Mobility status PT Visit Diagnosis: Difficulty in walking, not elsewhere classified (R26.2);Pain Pain - Right/Left: Left Pain - part of body: Knee     Time: 4944-9675 PT Time Calculation (min) (ACUTE ONLY): 26 min  Charges:  $Gait Training: 8-22  mins $Therapeutic Activity: 8-22 mins                    G Codes:       Rica Koyanagi  PTA [facility]  Acute  Rehab Pager      [phone]

## 2017-02-07 NOTE — Progress Notes (Signed)
Patient is currently in bed semi-fowlers's position resting. He had a drop in BP from 120's/64 to 83/47 approximately 20 min after transition to chair from bed.  After noted drop patient was moved back to bed and his BP increased to WNL.  Physician was informed and orders placed for Labs and med change. Awaiting lab and urine results. Continue to with plan of care. Patient reported off.

## 2017-02-07 NOTE — Progress Notes (Addendum)
Subjective:  Has been changed to oral medications without recurrence of a fib.  Blood pressure became low last night but has been high since then.  Had a little vomiting last night.  Has been able to keep his medicines down.  Potassium has been repleted.  Objective:  Vital Signs in the last 24 hours: BP (!) 164/75 (BP Location: Left Arm)   Pulse 75   Temp 98.4 F (36.9 C) (Oral)   Resp 18   Ht 6\' 1"  (1.854 m)   Wt 104.1 kg (229 lb 8 oz)   SpO2 99%   BMI 30.28 kg/m   Physical Exam: Pleasant obese male in no acute distress Lungs:  Clear Cardiac:  Regular rhythm, normal S1 and S2, no S3, harsh 2/6 murmur of aortic stenosis radiating to neck Extremities:  No edema present  Intake/Output from previous day: 09/08 0701 - 09/09 0700 In: 175.1 [I.V.:175.1] Out: 500 [Urine:500]  Weight Filed Weights   01/25/17 0951 02/01/17 1330 02/03/17 0355  Weight: 104.8 kg (231 lb) 100.9 kg (222 lb 7.1 oz) 104.1 kg (229 lb 8 oz)    Lab Results: Basic Metabolic Panel:  Recent Labs  02/06/17 0747 02/07/17 0450  NA 132* 134*  K 3.2* 4.4  CL 98* 96*  CO2 25 29  GLUCOSE 108* 127*  BUN 17 14  CREATININE 0.63 0.62   CBC:  Recent Labs  02/06/17 0747 02/07/17 0450  WBC 12.2* 21.7*  HGB 12.1* 11.8*  HCT 34.9* 35.3*  MCV 87.3 87.6  PLT 287 592*   Telemetry: Sinus rhythm overnight.  Assessment/Plan:  1.  Postoperative atrial fibrillation currently in sinus rhythm 2.  Obesity 3.  Aortic stenosis-moderate  Recommendations:  He was hypotensive last night but blood pressure is again elevated today.  We'll continue amiodarone and digoxin.  He is currently on carvedilol.  His irbesartan may need to be restarted but will try a lower dose.      Kerry Hough  MD [facility]  02/07/2017, 9:18 AM

## 2017-02-07 NOTE — Progress Notes (Addendum)
Pharmacy Antibiotic Note  [name] is a 66 y.o. male admitted on 01/25/2017 with concern for developing sepsis.  Pharmacy has been consulted for Zosyn dosing.  Plan: Zosyn 3.375g IV q8h (4 hour infusion time).  Patient's SCr WNL and stable. Do not anticipate dose adjustments so will sign off at this time. Please re-consult if needed.  Height: 6\' 1"  (185.4 cm) Weight: 229 lb 8 oz (104.1 kg) IBW/kg (Calculated) : 79.9  Temp (24hrs), Avg:98.2 F (36.8 C), Min:97.9 F (36.6 C), Max:98.5 F (36.9 C)   Recent Labs Lab 02/02/17 0512 02/03/17 0319 02/04/17 0306 02/05/17 0328 02/06/17 0747 02/07/17 0450  WBC 17.8*  --  23.3* 18.1* 12.2* 21.7*  CREATININE 0.63 0.64 0.63 0.57* 0.63 0.62    Estimated Creatinine Clearance: 115.1 mL/min (by C-G formula based on SCr of 0.62 mg/dL).    Allergies  Allergen Reactions  . Bee Venom Anaphylaxis  . Codeine Shortness Of Breath and Rash  . Doxycycline Shortness Of Breath and Rash  . Ivp Dye [Iodinated Diagnostic Agents] Shortness Of Breath    Lost consciousness/difficulty breathing [facility] - Paque Contrast   . Levaquin [Levofloxacin] Hives and Shortness Of Breath  . Oxycodone Hcl Shortness Of Breath, Swelling and Rash  . Stadol [Butorphanol] Shortness Of Breath  . Succinylcholine Chloride Shortness Of Breath, Nausea Only, Rash and Other (See Comments)    Difficulty breathing   . Atorvastatin     myalgias  . Bystolic [Nebivolol Hcl] Other (See Comments)    Nightmares, flashbacks (PTSD)  . Iohexol      Desc: hives,neck and torso erythemia   . Methocarbamol Nausea Only    rash  . Pentazocine Lactate     rash  . Dilaudid [Hydromorphone Hcl] Nausea Only and Rash    Aggressive   . Synvisc [Hylan G-F 20] Hives and Rash    Antimicrobials this admission: 9/9 Zosyn >>  Dose adjustments this admission:  Microbiology results: 9/3 MRSA PCR: neg  Thank you for allowing pharmacy to be a part of this patient's care.  [name] 02/07/2017 11:51 AM

## 2017-02-07 NOTE — Progress Notes (Signed)
PT in to work with patient.  Assisted him up to [facility] and then to chair.  Arrived to room, patient pale in color and BP 83/47.  Assisted patient back to bed.  BP increased to 96/59 and patient less light headed.  Notified MD and orders received. Andre Lefort

## 2017-02-07 NOTE — Progress Notes (Signed)
  General Surgery [facility]  Assessment & Plan: Adynamic ileus             NG removed - clear liquid diet - some nausea, emesis this AM             Encouraged OOB, ambulation with PT  Passing flatus, no BM's yet Status post left TKR  WBC increased to 22K - ?source  Per orthopedics        Earnstine Regal, MD, [facility]       Office: [phone]    Chief Complaint: Nausea this AM  Subjective: Patient feels like he drank too much "juice" last night.  Passing flatus, no BM's.  Objective: Vital signs in last 24 hours: Temp:  [97.4 F (36.3 C)-98.5 F (36.9 C)] 98.4 F (36.9 C) (09/09 0409) Pulse Rate:  [67-75] 75 (09/09 0409) Resp:  [13-21] 18 (09/09 0409) BP: (92-183)/(61-86) 164/75 (09/09 0619) SpO2:  [95 %-100 %] 99 % (09/09 0409) Last BM Date: 02/06/17  Intake/Output from previous day: 09/08 0701 - 09/09 0700 In: 175.1 [I.V.:175.1] Out: 500 [Urine:500] Intake/Output this shift: No intake/output data recorded.  Physical Exam: HEENT - sclerae clear, mucous membranes moist Neck - soft Abdomen - moderate distension; BS present; non-tender Neuro - alert & oriented, no focal deficits  Lab Results:   Recent Labs  02/06/17 0747 02/07/17 0450  WBC 12.2* 21.7*  HGB 12.1* 11.8*  HCT 34.9* 35.3*  PLT 287 592*   BMET  Recent Labs  02/06/17 0747 02/07/17 0450  NA 132* 134*  K 3.2* 4.4  CL 98* 96*  CO2 25 29  GLUCOSE 108* 127*  BUN 17 14  CREATININE 0.63 0.62  CALCIUM 8.8* 9.0   PT/INR No results for input(s): LABPROT, INR in the last 72 hours. Comprehensive Metabolic Panel:    Component Value Date/Time   NA 134 (L) 02/07/2017 0450   NA 132 (L) 02/06/2017 0747   K 4.4 02/07/2017 0450   K 3.2 (L) 02/06/2017 0747   CL 96 (L) 02/07/2017 0450   CL 98 (L) 02/06/2017 0747   CO2 29 02/07/2017 0450   CO2 25 02/06/2017 0747   BUN 14 02/07/2017 0450   BUN 17 02/06/2017 0747   CREATININE 0.62  02/07/2017 0450   CREATININE 0.63 02/06/2017 0747   CREATININE 0.90 10/15/2016 1348   CREATININE 0.79 07/24/2016 1544   GLUCOSE 127 (H) 02/07/2017 0450   GLUCOSE 108 (H) 02/06/2017 0747   CALCIUM 9.0 02/07/2017 0450   CALCIUM 8.8 (L) 02/06/2017 0747   AST 51 (H) 02/05/2017 0328   AST 23 01/31/2017 0504   ALT 73 (H) 02/05/2017 0328   ALT 29 01/31/2017 0504   ALKPHOS 83 02/05/2017 0328   ALKPHOS 74 01/31/2017 0504   BILITOT 1.5 (H) 02/05/2017 0328   BILITOT 2.0 (H) 01/31/2017 0504   PROT 6.0 (L) 02/05/2017 0328   PROT 7.2 01/31/2017 0504   ALBUMIN 2.7 (L) 02/05/2017 0328   ALBUMIN 3.2 (L) 01/31/2017 0504    Studies/Results: No results found.    Elane Peabody M 02/07/2017  Patient ID: [name], male   DOB: [birth year], 66 y.o.   MRN: [id number]

## 2017-02-07 NOTE — Progress Notes (Signed)
PT Cancellation Note  Patient Details Name: HOUSTON ZAPIEN MRN: [id number] DOB: [birth year]   Cancelled Treatment:     pt out of room for ABD and chest X ray   Rica Koyanagi  PTA [facility]  Acute  Rehab Pager      [phone]

## 2017-02-07 NOTE — Progress Notes (Signed)
Assumed care of the patient from off going RN. Condition stable. MD at bedside to discuss xray. Per md npo except for ice chips and sips  With meds..The current medical regimen is effective;  continue present plan and medications. With plan of care. Still awaiting urine speciman

## 2017-02-08 ENCOUNTER — Inpatient Hospital Stay (HOSPITAL_COMMUNITY): Payer: Medicare Other

## 2017-02-08 DIAGNOSIS — I959 Hypotension, unspecified: Secondary | ICD-10-CM

## 2017-02-08 DIAGNOSIS — I10 Essential (primary) hypertension: Secondary | ICD-10-CM

## 2017-02-08 LAB — CBC
HCT: 32.4 % — ABNORMAL LOW (ref 39.0–52.0)
Hemoglobin: 11 g/dL — ABNORMAL LOW (ref 13.0–17.0)
MCH: 28.9 pg (ref 26.0–34.0)
MCHC: 34 g/dL (ref 30.0–36.0)
MCV: 85 fL (ref 78.0–100.0)
PLATELETS: 474 10*3/uL — AB (ref 150–400)
RBC: 3.81 MIL/uL — ABNORMAL LOW (ref 4.22–5.81)
RDW: 13.5 % (ref 11.5–15.5)
WBC: 11.9 10*3/uL — ABNORMAL HIGH (ref 4.0–10.5)

## 2017-02-08 LAB — COMPREHENSIVE METABOLIC PANEL
ALBUMIN: 2.7 g/dL — AB (ref 3.5–5.0)
ALT: 169 U/L — ABNORMAL HIGH (ref 17–63)
ANION GAP: 9 (ref 5–15)
AST: 101 U/L — ABNORMAL HIGH (ref 15–41)
Alkaline Phosphatase: 102 U/L (ref 38–126)
BILIRUBIN TOTAL: 1.4 mg/dL — AB (ref 0.3–1.2)
BUN: 13 mg/dL (ref 6–20)
CALCIUM: 8.5 mg/dL — AB (ref 8.9–10.3)
CO2: 26 mmol/L (ref 22–32)
Chloride: 100 mmol/L — ABNORMAL LOW (ref 101–111)
Creatinine, Ser: 0.66 mg/dL (ref 0.61–1.24)
GFR calc Af Amer: >60 mL/min (ref >60)
GLUCOSE: 120 mg/dL — AB (ref 65–99)
POTASSIUM: 3.6 mmol/L (ref 3.5–5.1)
Sodium: 135 mmol/L (ref 135–145)
TOTAL PROTEIN: 5.8 g/dL — AB (ref 6.5–8.1)

## 2017-02-08 MED ORDER — ENSURE ENLIVE PO LIQD
237.0000 mL | Freq: Two times a day (BID) | ORAL | Status: DC
  Administered 2017-02-08 – 2017-02-09 (×3): 237 mL via ORAL

## 2017-02-08 MED ORDER — DILTIAZEM HCL ER COATED BEADS 180 MG PO CP24
180.0000 mg | ORAL_CAPSULE | Freq: Every day | ORAL | Status: DC
  Administered 2017-02-09: 180 mg via ORAL
  Filled 2017-02-08: qty 1

## 2017-02-08 MED ORDER — CARVEDILOL 6.25 MG PO TABS
6.2500 mg | ORAL_TABLET | Freq: Two times a day (BID) | ORAL | Status: DC
  Administered 2017-02-08 – 2017-02-09 (×3): 6.25 mg via ORAL
  Filled 2017-02-08 (×3): qty 1

## 2017-02-08 NOTE — Progress Notes (Signed)
Physical Therapy Treatment Patient Details Name: [name] MRN: [id number] DOB: 1950-10-07 Today's Date: 02/08/2017    History of Present Illness  L TKR post op Ileus/A Fib    PT Comments    POD # 14 Pt feeling "better".  Assisted with amb in hallway an increased distance twice with one sitting rest break.  Resting HR was 67 and HR with amb 87.  Mild c/o wealness.  Eager to D/C to home.   Follow Up Recommendations  DC plan and follow up therapy as arranged by surgeon;Supervision/Assistance - 24 hour     Equipment Recommendations  Rolling walker with 5" wheels;3in1 (PT)    Recommendations for Other Services       Precautions / Restrictions Precautions Precautions: Fall;Knee Precaution Comments: able to perform IND SLR Knee Immobilizer - Left: Discontinue once straight leg raise with < 10 degree lag Restrictions Weight Bearing Restrictions: No Other Position/Activity Restrictions: WBAT    Mobility  Bed Mobility Overal bed mobility: Needs Assistance Bed Mobility: Supine to Sit     Supine to sit: Supervision     General bed mobility comments: able to self perform with increased time and use of rail  Transfers Overall transfer level: Needs assistance Equipment used: Rolling walker (2 wheeled) Transfers: Sit to/from [facility] Sit to Stand: Supervision Stand pivot transfers: Supervision       General transfer comment: increased time and one VC to extend L LE prior to sit  Ambulation/Gait Ambulation/Gait assistance: Supervision;Min guard Ambulation Distance (Feet): 110 Feet (55 feet x 2 one siting rest break) Assistive device: Rolling walker (2 wheeled) Gait Pattern/deviations: Step-to pattern;Step-through pattern;Decreased stance time - left Gait velocity: decreased    General Gait Details: increased time and one VC safety with turn completion.  Resting HR 67.  HR with act 87.  Mild c/o weakness   Stairs            Wheelchair  Mobility    Modified Rankin (Stroke Patients Only)       Balance                                            Cognition Arousal/Alertness: Awake/alert Behavior During Therapy: WFL for tasks assessed/performed Overall Cognitive Status: Within Functional Limits for tasks assessed                                 General Comments: feeling "better'      Exercises      General Comments        Pertinent Vitals/Pain Pain Assessment: Faces Faces Pain Scale: Hurts a little bit Pain Location: L knee Pain Descriptors / Indicators: Tightness Pain Intervention(s): Monitored during session;Repositioned    Home Living                      Prior Function            PT Goals (current goals can now be found in the care plan section) Progress towards PT goals: Progressing toward goals    Frequency    7X/week      PT Plan Current plan remains appropriate    Co-evaluation              AM-PAC PT "6 Clicks" Daily Activity  Outcome Measure  Difficulty turning  over in bed (including adjusting bedclothes, sheets and blankets)?: A Lot Difficulty moving from lying on back to sitting on the side of the bed? : A Lot Difficulty sitting down on and standing up from a chair with arms (e.g., wheelchair, bedside commode, etc,.)?: A Lot Help needed moving to and from a bed to chair (including a wheelchair)?: A Lot Help needed walking in hospital room?: A Lot Help needed climbing 3-5 steps with a railing? : A Lot 6 Click Score: 12    End of Session Equipment Utilized During Treatment: Gait belt Activity Tolerance: Patient tolerated treatment well;Other (comment) Patient left: with call bell/phone within reach;in chair Nurse Communication: Mobility status PT Visit Diagnosis: Difficulty in walking, not elsewhere classified (R26.2);Pain Pain - Right/Left: Left Pain - part of body: Knee     Time: 2820-6015 PT Time Calculation (min)  (ACUTE ONLY): 27 min  Charges:  $Gait Training: 8-22 mins $Therapeutic Activity: 8-22 mins                    G Codes:       Rica Koyanagi  PTA [facility]  Acute  Rehab Pager      [phone]

## 2017-02-08 NOTE — Progress Notes (Signed)
Pt not ready for discharge. Plan to discharge home with [facility] and DME BSC from [place]. [facility] following for discharge.

## 2017-02-08 NOTE — Progress Notes (Signed)
BP 87/48 with patient lying in bed.  Pt was transitioned to chair earlier this a.m. before Coreg and maintained BP without issue. MD notified and patient's head lowered.  Pt reports feeling dizzy but no significant symptoms. [name]

## 2017-02-08 NOTE — Progress Notes (Signed)
[facility] Progress Note  14 Days Post-Op  Subjective: CC: distention and ileus  Objective: Vital signs in last 24 hours: Temp:  [97.6 F (36.4 C)-98 F (36.7 C)] 98 F (36.7 C) (09/10 0521) Pulse Rate:  [57-77] 72 (09/10 0832) Resp:  [18-24] 18 (09/10 0521) BP: (83-157)/(47-75) 153/73 (09/10 0832) SpO2:  [95 %-100 %] 95 % (09/10 0902) Last BM Date: 02/06/17  Intake/Output from previous day: 09/09 0701 - 09/10 0700 In: 2418.8 [I.V.:1718.8; IV Piggyback:700] Out: 1850 [Urine:1850] Intake/Output this shift: No intake/output data recorded.  PE: Gen:  Alert, NAD, pleasant Card:  Regular rate and rhythm, pedal pulses 2+ BL Pulm:  Normal effort, clear to auscultation bilaterally Abd: Soft, non-tender, non-distended, bowel sounds present in all 4 quadrants, no HSM, incisions C/D/I Skin: warm and dry, no rashes  Psych: A&Ox3   Lab Results:   Recent Labs  02/07/17 0450 02/08/17 0420  WBC 21.7* 11.9*  HGB 11.8* 11.0*  HCT 35.3* 32.4*  PLT 592* 474*   BMET  Recent Labs  02/07/17 0450 02/08/17 0420  NA 134* 135  K 4.4 3.6  CL 96* 100*  CO2 29 26  GLUCOSE 127* 120*  BUN 14 13  CREATININE 0.62 0.66  CALCIUM 9.0 8.5*   CMP     Component Value Date/Time   NA 135 02/08/2017 0420   K 3.6 02/08/2017 0420   CL 100 (L) 02/08/2017 0420   CO2 26 02/08/2017 0420   GLUCOSE 120 (H) 02/08/2017 0420   BUN 13 02/08/2017 0420   CREATININE 0.66 02/08/2017 0420   CREATININE 0.90 10/15/2016 1348   CALCIUM 8.5 (L) 02/08/2017 0420   PROT 5.8 (L) 02/08/2017 0420   ALBUMIN 2.7 (L) 02/08/2017 0420   AST 101 (H) 02/08/2017 0420   ALT 169 (H) 02/08/2017 0420   ALKPHOS 102 02/08/2017 0420   BILITOT 1.4 (H) 02/08/2017 0420   GFRNONAA >60 02/08/2017 0420   GFRNONAA >89 02/12/2014 1541   GFRAA >60 02/08/2017 0420   GFRAA >89 02/12/2014 1541   Lipase     Component Value Date/Time   LIPASE 11 01/15/2014 1945   Studies/Results: Dg Chest 2 View  Result Date:  02/07/2017 CLINICAL DATA:  Patient with low blood pressure. Abdominal distension. EXAM: CHEST  2 VIEW COMPARISON:  Chest radiograph 02/01/2017 FINDINGS: Monitoring leads overlie the patient. Stable enlarged cardiac and mediastinal contours. Low lung volumes. Basilar atelectasis. No pleural effusion or pneumothorax. IMPRESSION: Low lung volumes with basilar atelectasis. Electronically Signed   By: Lovey Newcomer M.D.   On: 02/07/2017 14:59   Dg Abd 2 Views  Result Date: 02/07/2017 CLINICAL DATA:  Acute abdominal distension. EXAM: ABDOMEN - 2 VIEW COMPARISON:  Radiographs of February 04, 2017. FINDINGS: Status post cholecystectomy. Moderately dilated small bowel loops are noted concerning for distal small bowel obstruction. No definite colonic dilatation is noted. No free air is noted. IMPRESSION: Mildly dilated small bowel loops are noted concerning for distal small bowel obstruction. Continued radiographic follow-up is recommended. Electronically Signed   By: Marijo Conception, M.D.   On: 02/07/2017 14:55   Dg Abd Portable 2v  Result Date: 02/08/2017 CLINICAL DATA:  Small bowel obstruction EXAM: PORTABLE ABDOMEN - 2 VIEW COMPARISON:  Yesterday FINDINGS: Gaseous distention of bowel, predominantly proximal small bowel. Some colonic gas and stool is also present, stool mainly seen along the transverse colon. The stomach is gas distended with a fluid level on decubitus imaging. No evidence of pneumoperitoneum. Pneumobilia, with cholecystectomy clips suggesting previous sphincterotomy.  IMPRESSION: Mildly improved gaseous distension of bowel. No evidence of pneumoperitoneum. Electronically Signed   By: Monte Fantasia M.D.   On: 02/08/2017 06:56    Anti-infectives: Anti-infectives    Start     Dose/Rate Route Frequency Ordered Stop   02/07/17 1400  piperacillin-tazobactam (ZOSYN) IVPB 3.375 g     3.375 g 12.5 mL/hr over 240 Minutes Intravenous Every 8 hours 02/07/17 1154     02/07/17 0800  cephALEXin (KEFLEX)  capsule 500 mg  Status:  Discontinued     500 mg Oral Every 6 hours 02/07/17 0728 02/07/17 1151   02/04/17 1115  ceFAZolin (ANCEF) IVPB 2g/100 mL premix  Status:  Discontinued     2 g 200 mL/hr over 30 Minutes Intravenous  Once 02/04/17 1110 02/04/17 1152   02/04/17 0710  ceFAZolin (ANCEF) 2-4 GM/100ML-% IVPB    Comments:  [name]   : cabinet override      02/04/17 0710 02/04/17 1914   02/04/17 0651  vancomycin (VANCOCIN) 1-5 GM/200ML-% IVPB    Comments:  [name]   : cabinet override      02/04/17 0651 02/04/17 0912   01/25/17 1800  ceFAZolin (ANCEF) IVPB 2g/100 mL premix     2 g 200 mL/hr over 30 Minutes Intravenous Every 6 hours 01/25/17 1510 01/25/17 2359   01/25/17 1003  ceFAZolin (ANCEF) 2-4 GM/100ML-% IVPB    Comments:  [name]   : cabinet override      01/25/17 1003 01/25/17 1130   01/25/17 0933  ceFAZolin (ANCEF) IVPB 2g/100 mL premix     2 g 200 mL/hr over 30 Minutes Intravenous On call to O.R. 01/25/17 0933 01/25/17 1200     Assessment/Plan Adynamic ileus  - Started to clinically improve and NG removed 9/8  - On clear liquids  - AXR with mild improvement of small bowel distention and possible distal SBO Leukocytosis - WBC 11.9 from 21.7 s/p initiation of empiric Zosyn; unknown source - CXR w/ bibasilar atelectasis no obvious infiltrate, UA pending  S/P Left TKA - per ortho Post-operative A.fib - PO meds, per cards Aortic stenosis  obesity   FEN: ID: Zosyn 9/9 >> VTE: SCD's, Xarelto      LOS: 14 days    Jill Alexanders , [facility] 02/08/2017, 10:42 AM Pager: [phone] Consults: [phone] Mon-Fri 7:00 am-4:30 pm Sat-Sun 7:00 am-11:30 am

## 2017-02-08 NOTE — Progress Notes (Addendum)
Progress Note  Patient Name: [name] Date of Encounter: 02/08/2017  Primary Cardiologist: Dr. Ellyn Hack  Subjective   Remaining in sinus rhythm without recurrence of afib on oral medications. He has hd some vomiting but has been keeping medications down.  Had episode of dropping his blood pressure from 124/64 to 83/47 with transition from bed to chair. Once transitioned back to bed his BP recovered.  Working on breathing exercises on arrival. Feeling well this AM.  Inpatient Medications    Scheduled Meds: . albuterol  2.5 mg Nebulization BID  . amiodarone  200 mg Oral BID  . bisacodyl  10 mg Rectal Daily  . carvedilol  25 mg Oral BID WC  . digoxin  0.25 mg Oral Daily  . diltiazem  60 mg Oral Q8H  . docusate sodium  100 mg Oral BID  . fluticasone  1 spray Each Nare Daily  . fluticasone furoate-vilanterol  1 puff Inhalation Daily  . Naldemedine Tosylate  0.2 mg Oral Q1200  . rivaroxaban  20 mg Oral Q supper   Continuous Infusions: . sodium chloride 100 mL/hr at 02/08/17 0603  . methocarbamol (ROBAXIN)  IV Stopped (02/02/17 2142)  . piperacillin-tazobactam (ZOSYN)  IV 3.375 g (02/08/17 0525)   PRN Meds: acetaminophen **OR** acetaminophen, albuterol, ALPRAZolam, benzonatate, cyclobenzaprine, diphenhydrAMINE, furosemide, hydrALAZINE, labetalol, LORazepam, home med stored in pyxis, menthol-cetylpyridinium **OR** phenol, methocarbamol (ROBAXIN)  IV, metoCLOPramide **OR** metoCLOPramide (REGLAN) injection, morphine, morphine injection, nitroGLYCERIN, ondansetron **OR** ondansetron (ZOFRAN) IV, polyethylene glycol, promethazine, promethazine, simethicone, sodium phosphate, traMADol   Vital Signs    Vitals:   02/07/17 1711 02/07/17 1942 02/07/17 2141 02/08/17 0521  BP: 101/61  (!) 152/75 (!) 157/75  Pulse: 60 62 72 67  Resp: (!) 22 20 18 18   Temp:   97.9 F (36.6 C) 98 F (36.7 C)  TempSrc:   Oral Oral  SpO2:  97% 97% 97%  Weight:      Height:        Intake/Output  Summary (Last 24 hours) at 02/08/17 0733 Last data filed at 02/08/17 0634  Gross per 24 hour  Intake          2418.75 ml  Output             1850 ml  Net           568.75 ml   Filed Weights   01/25/17 0951 02/01/17 1330 02/03/17 0355  Weight: 231 lb (104.8 kg) 222 lb 7.1 oz (100.9 kg) 229 lb 8 oz (104.1 kg)    Telemetry    Sinus rhythm - Personally Reviewed   Physical Exam   GEN: Well nourished, well developed HEENT: normal  Neck: no JVD, carotid bruits, or masses Cardiac: RRR. no  rubs, or gallops,no edema. Intact distal pulses bilaterally. 2/6 SM at RUSB to LLSB Respiratory: clear to auscultation bilaterally, normal work of breathing GI: soft, nontender, nondistended, + BS MS: no deformity or atrophy  Skin: warm and dry, no rash Neuro: Alert and Oriented x 3, Strength and sensation are intact Psych:   Full affect  Labs    Chemistry Recent Labs Lab 02/05/17 0328 02/06/17 0747 02/07/17 0450 02/08/17 0420  NA 135 132* 134* 135  K 3.5 3.2* 4.4 3.6  CL 102 98* 96* 100*  CO2 24 25 29 26   GLUCOSE 119* 108* 127* 120*  BUN 17 17 14 13   CREATININE 0.57* 0.63 0.62 0.66  CALCIUM 8.7* 8.8* 9.0 8.5*  PROT 6.0*  --   --  5.8*  ALBUMIN 2.7*  --   --  2.7*  AST 51*  --   --  101*  ALT 73*  --   --  169*  ALKPHOS 83  --   --  102  BILITOT 1.5*  --   --  1.4*  GFRNONAA >60 >60 >60 >60  GFRAA >60 >60 >60 >60  ANIONGAP 9 9 9 9      Hematology Recent Labs Lab 02/06/17 0747 02/07/17 0450 02/08/17 0420  WBC 12.2* 21.7* 11.9*  RBC 4.00* 4.03* 3.81*  HGB 12.1* 11.8* 11.0*  HCT 34.9* 35.3* 32.4*  MCV 87.3 87.6 85.0  MCH 30.3 29.3 28.9  MCHC 34.7 33.4 34.0  RDW 13.7 13.6 13.5  PLT 287 592* 474*    Cardiac Enzymes Recent Labs Lab 02/01/17 2316 02/02/17 0512 02/02/17 1057 02/02/17 1952  TROPONINI 0.27* 0.22* 0.14* 0.14*   No results for input(s): TROPIPOC in the last 168 hours.   BNPNo results for input(s): BNP, PROBNP in the last 168 hours.   DDimer No  results for input(s): DDIMER in the last 168 hours.   Radiology    Dg Chest 2 View  Result Date: 02/07/2017 CLINICAL DATA:  Patient with low blood pressure. Abdominal distension. EXAM: CHEST  2 VIEW COMPARISON:  Chest radiograph 02/01/2017 FINDINGS: Monitoring leads overlie the patient. Stable enlarged cardiac and mediastinal contours. Low lung volumes. Basilar atelectasis. No pleural effusion or pneumothorax. IMPRESSION: Low lung volumes with basilar atelectasis. Electronically Signed   By: Lovey Newcomer M.D.   On: 02/07/2017 14:59   Dg Abd 2 Views  Result Date: 02/07/2017 CLINICAL DATA:  Acute abdominal distension. EXAM: ABDOMEN - 2 VIEW COMPARISON:  Radiographs of February 04, 2017. FINDINGS: Status post cholecystectomy. Moderately dilated small bowel loops are noted concerning for distal small bowel obstruction. No definite colonic dilatation is noted. No free air is noted. IMPRESSION: Mildly dilated small bowel loops are noted concerning for distal small bowel obstruction. Continued radiographic follow-up is recommended. Electronically Signed   By: Marijo Conception, M.D.   On: 02/07/2017 14:55   Dg Abd Portable 2v  Result Date: 02/08/2017 CLINICAL DATA:  Small bowel obstruction EXAM: PORTABLE ABDOMEN - 2 VIEW COMPARISON:  Yesterday FINDINGS: Gaseous distention of bowel, predominantly proximal small bowel. Some colonic gas and stool is also present, stool mainly seen along the transverse colon. The stomach is gas distended with a fluid level on decubitus imaging. No evidence of pneumoperitoneum. Pneumobilia, with cholecystectomy clips suggesting previous sphincterotomy. IMPRESSION: Mildly improved gaseous distension of bowel. No evidence of pneumoperitoneum. Electronically Signed   By: Monte Fantasia M.D.   On: 02/08/2017 06:56    Cardiac Studies   Echocardiogram 02/02/2017  Study Conclusions  - Left ventricle: The cavity size was normal. Wall thickness was   increased in a pattern of moderate  LVH. Systolic function was   vigorous. The estimated ejection fraction was in the range of 65%   to 70%. There was dynamic obstruction at rest, with a peak   velocity of 210 cm/sec and a peak gradient of 18 mm Hg. Wall   motion was normal; there were no regional wall motion   abnormalities. - Aortic valve: Valve mobility was restricted, worse in the   non-coronary cusp. There was moderate stenosis. There was mild   regurgitation. Peak velocity (S): 340 cm/s. Mean gradient (S): 25   mm Hg. Valve area (VTI): 1.34 cm^2. Valve area (Vmax): 1.39 cm^2.   Valve area (Vmean): 1.38 cm^2. -  Mitral valve: Transvalvular velocity was within the normal range.   There was no evidence for stenosis. There was no regurgitation. - Left atrium: The atrium was mildly dilated. - Right ventricle: The cavity size was normal. Wall thickness was   normal. Systolic function was normal. - Tricuspid valve: There was trivial regurgitation.  Patient Profile      Mr. Schweer 66 y.o. post left TKR comlicated by ileus requiring NG tube and PaF. Had cath 3 months ago by Dr Ellyn Hack with no CAD  Assessment & Plan    1. Postoperative atrial fibrillation, currently in sinus rhythm on Amiodarone 200 mg BID, Cardizem 60 mg q 8 and Digoxin 0.25 mg daily with no recurrence of atrial fibrillation. Anticoagulated on Xarelto.  This patients CHA2DS2-VASc Score and unadjusted Ischemic Stroke Rate (% per year) is equal to 3.2 % stroke rate/year from a score of 3  Above score calculated as 1 point each if present [CHF, HTN, DM, Vascular=MI/PAD/Aortic Plaque, Age if 65-74, or Male] Above score calculated as 2 points each if present [Age > 75, or Stroke/TIA/TE]   2. Aortic stenosis- moderate  3. Obesity   Signed, Jena Gauss  02/08/2017, 7:33 AM    Patient seen and independently examined with Delos Haring, PA. We discussed all aspects of the encounter. I agree with the assessment and plan as stated above.   Patient denies any chest pain, SOB or palpitations.  Maintaining NSR on tele.  Lungs CTA, heart RRR with R/G and no LE edema.  There is a 2/6 SM at the RUSB to LLSB corresponding to his moderate AS and mid cavitary LV gradient on echo. Mild trop elevation secondary to demand ischemia.  Cath 3 months ago with no CAD. Will continue on Amio for now and hopefully can get him off this once he is out a few weeks from his surgery.  Would stop dig in setting of Amio to avoid toxicity, especially since he is in NSR and is already on BB and CCB and has moderate AS and resting intracavitary LV gradient.  BP still borderline elevated.  Will change Cardizem to CD 180mg  daily.  Continue Xarelto.  Would continue  Amio to 200mg  daily for 2 weeks and we will set him up for OV in 7-10 day.  Will sign off. Call with any questions. Of note LFTs are up but may be due to underlying GI issues.  Would repeat LFTs in am to make sure they do not continue to increase  Signed: Fransico Him, MD [facility] 02/08/2017

## 2017-02-08 NOTE — Progress Notes (Signed)
PROGRESS NOTE  [name] VFI:[id number] DOB: 01-03-1951 DOA: 01/25/2017 PCP: Remi Haggard, FNP   LOS: 14 days   Brief Narrative / Interim history: 66 year old male with history of type 2 diabetes mellitus, hyperlipidemia, hypertension, COPD, CAD, who was admitted on orthopedic surgery service with left knee osteoarthritis and underwent left total knee arthroplasty on 01/25/2017. Patient has been progressively nauseous and has been complaining of abdominal distention over the last 24 hours, this was associated with orthostatic hypotension requiring several IV fluid boluses. He denies any chest pain or palpitations. He denies any fevers or chills. He does have a cough with minimal sputum production, and that is somewhat chronic. He reports he has not had a bowel movement in several days, however he is passing gas. He has a history of intermittent chest pains for which she was admitted on cardiology service in May 2018, underwent a cardiac catheterization which was negative for significant coronary artery disease. He also has COPD followed by Dr. Annamaria Boots with pulmonology (patient never smoked). He does have history of extensive intra-abdominal surgery (appendicectomy, Bilroth II), and has had an episode of SBO in 2015.   Assessment & Plan: Principal Problem:   Adynamic ileus ([place]) Active Problems:   Essential hypertension   OA (osteoarthritis) of knee   Mild aortic stenosis by prior echocardiogram   H/O major abdominal surgery   COPD mixed type (HCC)   Abdominal pain, nausea vomiting secondary to post-op ileus -NG tube was placed on 8/31, patient improved and his diet was advanced, however on 9/5 he has been having worsening nausea as well as abdominal distention, requiring change back to n.p.o. Status.  General surgery was consulted at that time and are currently following patient -tolerated NG tube clamping, no NG tube is discontinued.  Advance diet. -Did not feel good  yesterday when he was hypotensive, did not eat much.  Passing gas, however has had no BMs few days.  X-ray yesterday with concern for SBO, he was made again n.p.o. -Repeat x-ray this morning with mild improvement.  Discussed with surgery  COPD -Stable, no wheezing on exam, continue his home medications  Hypotension -Had an episode of hypotension on 9/9, thought initially to be orthostatic caused by either an infection source (as he had increased leukocytosis) versus medication related.  He was started on antibiotics empirically. -Had a repeat episode of hypotension today without change in position so not orthostatic.  Probably related to Coreg as both 9/9 and 9/10 happened after Coreg administration.  Will decrease the dose.  His ARB was discontinued as well  Atrial fibrillation with RVR -patient's CHA2DS2-VASc Score for Stroke Risk is 4 -Patient developed atrial fibrillation with RVR requiring transfer to stepdown.  Cardiology consulted.  -Amiodarone converted to p.o., defer to cardiology whether this need to be continued on discharge. -Transition to Xarelto -He remains in sinus rhythm, continue to keep on telemetry since will decrease Coreg  Chest pain /pressure concern for coronary artery disease -recent cardiac catheterization 3 months ago negative for significant coronary artery disease. -Most recent 2D echo October 2017 showed normal EF 60-65%, mild aortic stenosis and mild regurgitation -Troponin slightly elevated due to demand ischemia -Chest pain resolved  Osteoarthritis -pt is s/p L knee arthoplasty orthopedic surgery following,   ARF -Labs reviewed. Cr has normalized -Stop fluids, advance diet   DVT prophylaxis: Lovenox Code Status: Full code Family Communication: no family in the room Disposition Plan: TBD  Consultants:   Cardiology  General surgery  Procedures:  2D echo Study Conclusions - Left ventricle: The cavity size was normal. Wall thickness was  increased in a pattern of moderate LVH. Systolic function was vigorous. The estimated ejection fraction was in the range of 65% to 70%. There was dynamic obstruction at rest, with a peak velocity of 210 cm/sec and a peak gradient of 18 mm Hg. Wall motion was normal; there were no regional wall motion abnormalities. - Aortic valve: Valve mobility was restricted, worse in the non-coronary cusp. There was moderate stenosis. There was mild regurgitation. Peak velocity (S): 340 cm/s. Mean gradient (S): 25 mm Hg. Valve area (VTI): 1.34 cm^2. Valve area (Vmax): 1.39 cm^2. Valve area (Vmean): 1.38 cm^2. - Mitral valve: Transvalvular velocity was within the normal range. There was no evidence for stenosis. There was no regurgitation. - Left atrium: The atrium was mildly dilated. - Right ventricle: The cavity size was normal. Wall thickness was normal. Systolic function was normal. - Tricuspid valve: There was trivial regurgitation.  Antimicrobials:  None   Subjective: -Feels a little bit better this morning, denies any nausea or vomiting.  He denies abdominal pain.  Is passing gas however has had no BMs  Objective: Vitals:   02/08/17 0832 02/08/17 0902 02/08/17 1057 02/08/17 1101  BP: (!) 153/73  (!) 77/44 (!) 87/48  Pulse: 72  61   Resp:      Temp:   97.7 F (36.5 C)   TempSrc:   Oral   SpO2: 99% 95% 97%   Weight:      Height:        Intake/Output Summary (Last 24 hours) at 02/08/17 1108 Last data filed at 02/08/17 0634  Gross per 24 hour  Intake          2418.75 ml  Output             1850 ml  Net           568.75 ml   Filed Weights   01/25/17 0951 02/01/17 1330 02/03/17 0355  Weight: 104.8 kg (231 lb) 100.9 kg (222 lb 7.1 oz) 104.1 kg (229 lb 8 oz)    Examination:  Vitals:   02/08/17 0832 02/08/17 0902 02/08/17 1057 02/08/17 1101  BP: (!) 153/73  (!) 77/44 (!) 87/48  Pulse: 72  61   Resp:      Temp:   97.7 F (36.5 C)   TempSrc:   Oral   SpO2: 99% 95% 97%   Weight:        Height:       Constitutional: NAD, calm, comfortable Eyes: lids and conjunctivae normal ENMT: Mucous membranes are moist.  Respiratory: clear to auscultation bilaterally, no wheezing, no crackles. Normal respiratory effort.  Cardiovascular: Regular rate and rhythm, 3/6 SEM. No LE edema. 2+ pedal pulses.  Abdomen: no tenderness. Bowel sounds positive.  Neurologic: non focal    Data Reviewed: I have independently reviewed following labs and imaging studies  CBC:  Recent Labs Lab 02/04/17 0306 02/05/17 0328 02/06/17 0747 02/07/17 0450 02/08/17 0420  WBC 23.3* 18.1* 12.2* 21.7* 11.9*  HGB 13.1 11.3* 12.1* 11.8* 11.0*  HCT 38.1* 34.2* 34.9* 35.3* 32.4*  MCV 86.6 87.0 87.3 87.6 85.0  PLT 577* 549* 287 592* 222*   Basic Metabolic Panel:  Recent Labs Lab 02/04/17 0306 02/05/17 0328 02/06/17 0747 02/07/17 0450 02/08/17 0420  NA 138 135 132* 134* 135  K 3.7 3.5 3.2* 4.4 3.6  CL 104 102 98* 96* 100*  CO2 24 24 25  29  26  GLUCOSE 121* 119* 108* 127* 120*  BUN 17 17 17 14 13   CREATININE 0.63 0.57* 0.63 0.62 0.66  CALCIUM 8.9 8.7* 8.8* 9.0 8.5*   GFR: Estimated Creatinine Clearance: 115.1 mL/min (by C-G formula based on SCr of 0.66 mg/dL). Liver Function Tests:  Recent Labs Lab 02/05/17 0328 02/08/17 0420  AST 51* 101*  ALT 73* 169*  ALKPHOS 83 102  BILITOT 1.5* 1.4*  PROT 6.0* 5.8*  ALBUMIN 2.7* 2.7*   No results for input(s): LIPASE, AMYLASE in the last 168 hours. No results for input(s): AMMONIA in the last 168 hours. Coagulation Profile: No results for input(s): INR, PROTIME in the last 168 hours. Cardiac Enzymes:  Recent Labs Lab 02/01/17 1734 02/01/17 2316 02/02/17 0512 02/02/17 1057 02/02/17 1952  TROPONINI 0.22* 0.27* 0.22* 0.14* 0.14*   BNP (last 3 results) No results for input(s): PROBNP in the last 8760 hours. HbA1C: No results for input(s): HGBA1C in the last 72 hours. CBG: No results for input(s): GLUCAP in the last 168 hours. Lipid  Profile: No results for input(s): CHOL, HDL, LDLCALC, TRIG, CHOLHDL, LDLDIRECT in the last 72 hours. Thyroid Function Tests: No results for input(s): TSH, T4TOTAL, FREET4, T3FREE, THYROIDAB in the last 72 hours. Anemia Panel: No results for input(s): VITAMINB12, FOLATE, FERRITIN, TIBC, IRON, RETICCTPCT in the last 72 hours. Urine analysis:    Component Value Date/Time   COLORURINE ORANGE (A) 01/16/2014 1146   APPEARANCEUR TURBID (A) 01/16/2014 1146   LABSPEC 1.031 (H) 01/16/2014 1146   PHURINE 5.5 01/16/2014 1146   GLUCOSEU NEGATIVE 01/16/2014 1146   GLUCOSEU NEGATIVE 02/02/2007 0936   HGBUR NEGATIVE 01/16/2014 1146   BILIRUBINUR negative 12/10/2015 1503   KETONESUR 15 (A) 01/16/2014 1146   PROTEINUR negative 12/10/2015 1503   PROTEINUR NEGATIVE 01/16/2014 1146   UROBILINOGEN 2.0 12/10/2015 1503   UROBILINOGEN 1.0 01/16/2014 1146   NITRITE negative 12/10/2015 1503   NITRITE NEGATIVE 01/16/2014 1146   LEUKOCYTESUR Negative 12/10/2015 1503   Sepsis Labs: Invalid input(s): PROCALCITONIN, LACTICIDVEN  Recent Results (from the past 240 hour(s))  MRSA PCR Screening     Status: None   Collection Time: 02/01/17  1:46 PM  Result Value Ref Range Status   MRSA by PCR NEGATIVE NEGATIVE Final    Comment:        The GeneXpert MRSA Assay (FDA approved for NASAL specimens only), is one component of a comprehensive MRSA colonization surveillance program. It is not intended to diagnose MRSA infection nor to guide or monitor treatment for MRSA infections.       Radiology Studies: Dg Chest 2 View  Result Date: 02/07/2017 CLINICAL DATA:  Patient with low blood pressure. Abdominal distension. EXAM: CHEST  2 VIEW COMPARISON:  Chest radiograph 02/01/2017 FINDINGS: Monitoring leads overlie the patient. Stable enlarged cardiac and mediastinal contours. Low lung volumes. Basilar atelectasis. No pleural effusion or pneumothorax. IMPRESSION: Low lung volumes with basilar atelectasis.  Electronically Signed   By: Lovey Newcomer M.D.   On: 02/07/2017 14:59   Dg Abd 2 Views  Result Date: 02/07/2017 CLINICAL DATA:  Acute abdominal distension. EXAM: ABDOMEN - 2 VIEW COMPARISON:  Radiographs of February 04, 2017. FINDINGS: Status post cholecystectomy. Moderately dilated small bowel loops are noted concerning for distal small bowel obstruction. No definite colonic dilatation is noted. No free air is noted. IMPRESSION: Mildly dilated small bowel loops are noted concerning for distal small bowel obstruction. Continued radiographic follow-up is recommended. Electronically Signed   By: Marijo Conception, M.D.  On: 02/07/2017 14:55   Dg Abd Portable 2v  Result Date: 02/08/2017 CLINICAL DATA:  Small bowel obstruction EXAM: PORTABLE ABDOMEN - 2 VIEW COMPARISON:  Yesterday FINDINGS: Gaseous distention of bowel, predominantly proximal small bowel. Some colonic gas and stool is also present, stool mainly seen along the transverse colon. The stomach is gas distended with a fluid level on decubitus imaging. No evidence of pneumoperitoneum. Pneumobilia, with cholecystectomy clips suggesting previous sphincterotomy. IMPRESSION: Mildly improved gaseous distension of bowel. No evidence of pneumoperitoneum. Electronically Signed   By: Monte Fantasia M.D.   On: 02/08/2017 06:56     Scheduled Meds: . albuterol  2.5 mg Nebulization BID  . amiodarone  200 mg Oral BID  . bisacodyl  10 mg Rectal Daily  . carvedilol  6.25 mg Oral BID WC  . diltiazem  180 mg Oral Daily  . docusate sodium  100 mg Oral BID  . fluticasone  1 spray Each Nare Daily  . fluticasone furoate-vilanterol  1 puff Inhalation Daily  . Naldemedine Tosylate  0.2 mg Oral Q1200  . rivaroxaban  20 mg Oral Q supper   Continuous Infusions: . sodium chloride 100 mL/hr at 02/08/17 0603  . methocarbamol (ROBAXIN)  IV Stopped (02/02/17 2142)  . piperacillin-tazobactam (ZOSYN)  IV 3.375 g (02/08/17 0525)    Marzetta Board, MD, PhD [facility] Pager [phone]  If 7PM-7AM, please contact night-coverage [email or web address] Password [email or web address] 02/08/2017, 11:08 AM

## 2017-02-08 NOTE — Progress Notes (Signed)
Patient ID: [name], male   DOB: 12-09-1950, 66 y.o.   MRN: [id number] [facility] Surgery Progress Note:   14 Days Post-Op  Subjective: Mental status is clear.  Feeling better Objective: Vital signs in last 24 hours: Temp:  [97.6 F (36.4 C)-98 F (36.7 C)] 97.7 F (36.5 C) (09/10 1057) Pulse Rate:  [57-72] 61 (09/10 1057) Resp:  [18-22] 18 (09/10 0521) BP: (77-157)/(44-75) 118/57 (09/10 1244) SpO2:  [95 %-100 %] 97 % (09/10 1057)  Intake/Output from previous day: 09/09 0701 - 09/10 0700 In: 2418.8 [I.V.:1718.8; IV Piggyback:700] Out: 1850 [Urine:1850] Intake/Output this shift: No intake/output data recorded.  Physical Exam: Work of breathing is not labored.  Has passed flatus  Lab Results:  Results for orders placed or performed during the hospital encounter of 01/25/17 (from the past 48 hour(s))  Basic metabolic panel     Status: Abnormal   Collection Time: 02/07/17  4:50 AM  Result Value Ref Range   Sodium 134 (L) 135 - 145 mmol/L   Potassium 4.4 3.5 - 5.1 mmol/L    Comment: DELTA CHECK NOTED   Chloride 96 (L) 101 - 111 mmol/L   CO2 29 22 - 32 mmol/L   Glucose, Bld 127 (H) 65 - 99 mg/dL   BUN 14 6 - 20 mg/dL   Creatinine, Ser 0.62 0.61 - 1.24 mg/dL   Calcium 9.0 8.9 - 10.3 mg/dL   GFR calc non Af Amer >60 >60 mL/min   GFR calc Af Amer >60 >60 mL/min    Comment: (NOTE) The eGFR has been calculated using the CKD EPI equation. This calculation has not been validated in all clinical situations. eGFR's persistently <60 mL/min signify possible Chronic Kidney Disease.    Anion gap 9 5 - 15  CBC     Status: Abnormal   Collection Time: 02/07/17  4:50 AM  Result Value Ref Range   WBC 21.7 (H) 4.0 - 10.5 K/uL   RBC 4.03 (L) 4.22 - 5.81 MIL/uL   Hemoglobin 11.8 (L) 13.0 - 17.0 g/dL   HCT 35.3 (L) 39.0 - 52.0 %   MCV 87.6 78.0 - 100.0 fL   MCH 29.3 26.0 - 34.0 pg   MCHC 33.4 30.0 - 36.0 g/dL   RDW 13.6 11.5 - 15.5 %   Platelets 592 (H) 150 - 400 K/uL     Comment: DELTA CHECK NOTED  Lactic acid, plasma     Status: None   Collection Time: 02/07/17  5:50 PM  Result Value Ref Range   Lactic Acid, Venous 1.1 0.5 - 1.9 mmol/L  Comprehensive metabolic panel     Status: Abnormal   Collection Time: 02/08/17  4:20 AM  Result Value Ref Range   Sodium 135 135 - 145 mmol/L   Potassium 3.6 3.5 - 5.1 mmol/L    Comment: DELTA CHECK NOTED   Chloride 100 (L) 101 - 111 mmol/L   CO2 26 22 - 32 mmol/L   Glucose, Bld 120 (H) 65 - 99 mg/dL   BUN 13 6 - 20 mg/dL   Creatinine, Ser 0.66 0.61 - 1.24 mg/dL   Calcium 8.5 (L) 8.9 - 10.3 mg/dL   Total Protein 5.8 (L) 6.5 - 8.1 g/dL   Albumin 2.7 (L) 3.5 - 5.0 g/dL   AST 101 (H) 15 - 41 U/L   ALT 169 (H) 17 - 63 U/L   Alkaline Phosphatase 102 38 - 126 U/L   Total Bilirubin 1.4 (H) 0.3 - 1.2 mg/dL  GFR calc non Af Amer >60 >60 mL/min   GFR calc Af Amer >60 >60 mL/min    Comment: (NOTE) The eGFR has been calculated using the CKD EPI equation. This calculation has not been validated in all clinical situations. eGFR's persistently <60 mL/min signify possible Chronic Kidney Disease.    Anion gap 9 5 - 15  CBC     Status: Abnormal   Collection Time: 02/08/17  4:20 AM  Result Value Ref Range   WBC 11.9 (H) 4.0 - 10.5 K/uL   RBC 3.81 (L) 4.22 - 5.81 MIL/uL   Hemoglobin 11.0 (L) 13.0 - 17.0 g/dL   HCT 55.2 (L) 17.4 - 71.5 %   MCV 85.0 78.0 - 100.0 fL   MCH 28.9 26.0 - 34.0 pg   MCHC 34.0 30.0 - 36.0 g/dL   RDW 95.3 96.7 - 28.9 %   Platelets 474 (H) 150 - 400 K/uL    Radiology/Results: Dg Chest 2 View  Result Date: 02/07/2017 CLINICAL DATA:  Patient with low blood pressure. Abdominal distension. EXAM: CHEST  2 VIEW COMPARISON:  Chest radiograph 02/01/2017 FINDINGS: Monitoring leads overlie the patient. Stable enlarged cardiac and mediastinal contours. Low lung volumes. Basilar atelectasis. No pleural effusion or pneumothorax. IMPRESSION: Low lung volumes with basilar atelectasis. Electronically Signed   By:  Annia Belt M.D.   On: 02/07/2017 14:59   Dg Abd 2 Views  Result Date: 02/07/2017 CLINICAL DATA:  Acute abdominal distension. EXAM: ABDOMEN - 2 VIEW COMPARISON:  Radiographs of February 04, 2017. FINDINGS: Status post cholecystectomy. Moderately dilated small bowel loops are noted concerning for distal small bowel obstruction. No definite colonic dilatation is noted. No free air is noted. IMPRESSION: Mildly dilated small bowel loops are noted concerning for distal small bowel obstruction. Continued radiographic follow-up is recommended. Electronically Signed   By: Lupita Raider, M.D.   On: 02/07/2017 14:55   Dg Abd Portable 2v  Result Date: 02/08/2017 CLINICAL DATA:  Small bowel obstruction EXAM: PORTABLE ABDOMEN - 2 VIEW COMPARISON:  Yesterday FINDINGS: Gaseous distention of bowel, predominantly proximal small bowel. Some colonic gas and stool is also present, stool mainly seen along the transverse colon. The stomach is gas distended with a fluid level on decubitus imaging. No evidence of pneumoperitoneum. Pneumobilia, with cholecystectomy clips suggesting previous sphincterotomy. IMPRESSION: Mildly improved gaseous distension of bowel. No evidence of pneumoperitoneum. Electronically Signed   By: Marnee Spring M.D.   On: 02/08/2017 06:56    Anti-infectives: Anti-infectives    Start     Dose/Rate Route Frequency Ordered Stop   02/07/17 1400  piperacillin-tazobactam (ZOSYN) IVPB 3.375 g     3.375 g 12.5 mL/hr over 240 Minutes Intravenous Every 8 hours 02/07/17 1154     02/07/17 0800  cephALEXin (KEFLEX) capsule 500 mg  Status:  Discontinued     500 mg Oral Every 6 hours 02/07/17 0728 02/07/17 1151   02/04/17 1115  ceFAZolin (ANCEF) IVPB 2g/100 mL premix  Status:  Discontinued     2 g 200 mL/hr over 30 Minutes Intravenous  Once 02/04/17 1110 02/04/17 1152   02/04/17 0710  ceFAZolin (ANCEF) 2-4 GM/100ML-% IVPB    Comments:  Mirian Mo   : cabinet override      02/04/17 0710 02/04/17 1914    02/04/17 0651  vancomycin (VANCOCIN) 1-5 GM/200ML-% IVPB    Comments:  Mirian Mo   : cabinet override      02/04/17 0651 02/04/17 0912   01/25/17 1800  ceFAZolin (ANCEF) IVPB  2g/100 mL premix     2 g 200 mL/hr over 30 Minutes Intravenous Every 6 hours 01/25/17 1510 01/25/17 2359   01/25/17 1003  ceFAZolin (ANCEF) 2-4 GM/100ML-% IVPB    Comments:  [name]   : cabinet override      01/25/17 1003 01/25/17 1130   01/25/17 0933  ceFAZolin (ANCEF) IVPB 2g/100 mL premix     2 g 200 mL/hr over 30 Minutes Intravenous On call to O.R. 01/25/17 0933 01/25/17 1200      Assessment/Plan: Problem List: Patient Active Problem List   Diagnosis Date Noted  . Adynamic ileus ([place]) 02/03/2017  . Chest pain with moderate risk for cardiac etiology   . Allergy to intravenous contrast 10/15/2016  . Preop cardiovascular exam 07/15/2016  . COPD mixed type ([place]) 07/08/2016  . Carotid artery disease ([place]) 02/20/2016  . PTSD (post-traumatic stress disorder) 07/24/2015  . DOE (dyspnea on exertion) 09/06/2014  . H/O major abdominal surgery 01/16/2014  . Bilateral lower extremity edema 02/26/2013  . COPD with acute exacerbation ([place]) 01/19/2013  . Progressive angina ([place]) 10/25/2012  . Mild aortic stenosis by prior echocardiogram 03/01/2012  . Back pain, chronic, followed at pain clinic 11/25/2011  . Family history of acute myocardial infarction. and premature CAD 11/25/2011  . Constipation, chronic 09/21/2011  . Incisional hernia 09/21/2011  . Umbilical hernia 47/15/9539  . PULMONARY NODULE 01/22/2009  . Diabetes mellitus type 2, controlled, with complications ([place]) 67/28/9791  . OA (osteoarthritis) of knee 01/16/2008  . OTHER SLEEP DISTURBANCES 01/16/2008  . CLAUDICATION 07/22/2007  . NEPHROLITHIASIS, HX OF 07/22/2007  . Hyperlipidemia with target LDL less than 100 01/21/2007  . Other idiopathic peripheral autonomic neuropathy ([place]) 01/21/2007  . Essential hypertension 01/21/2007  .  Allergic rhinitis 01/21/2007  . GERD 01/21/2007  . DIVERTICULOSIS, COLON 01/21/2007  . OSTEOARTHRITIS 01/21/2007  . History of stroke 01/21/2007  . COLONIC POLYPS, HX OF 01/21/2007  . ESOPHAGEAL STRICTURE 02/02/2006  . ISCHEMIC COLITIS 08/07/2004  . ACUT DUOD ULCER W/O MENTION HEMORR PERF/OBST 06/24/2004    OK to start oral intake.   14 Days Post-Op    LOS: 14 days   Matt B. Hassell Done, MD, [facility] [phone] beeper [phone]  02/08/2017 12:55 PM

## 2017-02-09 LAB — CBC
HEMATOCRIT: 32.4 % — AB (ref 39.0–52.0)
HEMOGLOBIN: 11.2 g/dL — AB (ref 13.0–17.0)
MCH: 29.6 pg (ref 26.0–34.0)
MCHC: 34.6 g/dL (ref 30.0–36.0)
MCV: 85.7 fL (ref 78.0–100.0)
Platelets: 509 10*3/uL — ABNORMAL HIGH (ref 150–400)
RBC: 3.78 MIL/uL — ABNORMAL LOW (ref 4.22–5.81)
RDW: 13.8 % (ref 11.5–15.5)
WBC: 10.8 10*3/uL — ABNORMAL HIGH (ref 4.0–10.5)

## 2017-02-09 LAB — BASIC METABOLIC PANEL
ANION GAP: 10 (ref 5–15)
BUN: 8 mg/dL (ref 6–20)
CALCIUM: 8.5 mg/dL — AB (ref 8.9–10.3)
CO2: 21 mmol/L — ABNORMAL LOW (ref 22–32)
Chloride: 102 mmol/L (ref 101–111)
Creatinine, Ser: 0.71 mg/dL (ref 0.61–1.24)
Glucose, Bld: 127 mg/dL — ABNORMAL HIGH (ref 65–99)
POTASSIUM: 3.5 mmol/L (ref 3.5–5.1)
SODIUM: 133 mmol/L — AB (ref 135–145)

## 2017-02-09 MED ORDER — AMIODARONE HCL 200 MG PO TABS: 200.0000 mg | ORAL_TABLET | Freq: Two times a day (BID) | ORAL | 0 refills | Status: DC

## 2017-02-09 MED ORDER — CEPHALEXIN 500 MG PO CAPS: 500.0000 mg | ORAL_CAPSULE | Freq: Four times a day (QID) | ORAL | 0 refills | Status: AC

## 2017-02-09 MED ORDER — RIVAROXABAN 20 MG PO TABS: 20.0000 mg | ORAL_TABLET | Freq: Every day | ORAL | 1 refills | Status: DC

## 2017-02-09 MED ORDER — CARVEDILOL 6.25 MG PO TABS: 6.2500 mg | ORAL_TABLET | Freq: Two times a day (BID) | ORAL | 1 refills | Status: DC

## 2017-02-09 MED ORDER — POTASSIUM CHLORIDE CRYS ER 20 MEQ PO TBCR
40.0000 meq | EXTENDED_RELEASE_TABLET | Freq: Once | ORAL | Status: AC
  Administered 2017-02-09: 40 meq via ORAL
  Filled 2017-02-09: qty 2

## 2017-02-09 MED ORDER — POTASSIUM CHLORIDE CRYS ER 20 MEQ PO TBCR: 20.0000 meq | EXTENDED_RELEASE_TABLET | Freq: Every day | ORAL | 0 refills | Status: DC

## 2017-02-09 MED ORDER — DILTIAZEM HCL ER COATED BEADS 180 MG PO CP24: 180.0000 mg | ORAL_CAPSULE | Freq: Every day | ORAL | 0 refills | Status: DC
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
## 2017-02-09 NOTE — Progress Notes (Signed)
OT Cancellation Note  Patient Details Name: [name] MRN: [id number] DOB: Oct 29, 1950   Cancelled Treatment:    Reason Eval/Treat Not Completed: Other (comment).  Pt has been getting to toilet with assistance. He plans to sponge bathe at home and have wife assist him with adls. Will sign off from OT at this time  [facility] 02/09/2017, 10:45 AM  Lesle Chris, OTR/L [phone] 02/09/2017

## 2017-02-09 NOTE — Progress Notes (Signed)
Physical Therapy Treatment Patient Details Name: [name] MRN: [id number] DOB: Nov 11, 1950 Today's Date: 02/09/2017    History of Present Illness L TKR 01/25/17 post op Ileus and A Fib    PT Comments    POD #15 pm session Assisted with amb in hallway.  No stairs to enter home, pt has a ramp.  Follow Up Recommendations  DC plan and follow up therapy as arranged by surgeon;Supervision/Assistance - 24 hour     Equipment Recommendations  Rolling walker with 5" wheels;3in1 (PT)    Recommendations for Other Services       Precautions / Restrictions Precautions Precautions: Fall;Knee Restrictions Weight Bearing Restrictions: No Other Position/Activity Restrictions: WBAT    Mobility  Bed Mobility               General bed mobility comments: OOB in recliner  Transfers Overall transfer level: Needs assistance Equipment used: Rolling walker (2 wheeled) Transfers: Sit to/from Stand Sit to Stand: Supervision         General transfer comment: increased time and one VC to extend L LE prior to sit          good safety cognition  Ambulation/Gait Ambulation/Gait assistance: Supervision;Min guard Ambulation Distance (Feet): 43 Feet Assistive device: Rolling walker (2 wheeled) Gait Pattern/deviations: Step-to pattern;Step-through pattern;Decreased stance time - left Gait velocity: decreased    General Gait Details: good alternating gait   Stairs            Wheelchair Mobility    Modified Rankin (Stroke Patients Only)       Balance                                            Cognition Arousal/Alertness: Awake/alert Behavior During Therapy: WFL for tasks assessed/performed Overall Cognitive Status: Within Functional Limits for tasks assessed                                        Exercises      General Comments        Pertinent Vitals/Pain Pain Assessment: No/denies pain    Home Living                      Prior Function            PT Goals (current goals can now be found in the care plan section) Progress towards PT goals: Progressing toward goals    Frequency    7X/week      PT Plan Current plan remains appropriate    Co-evaluation              AM-PAC PT "6 Clicks" Daily Activity  Outcome Measure  Difficulty turning over in bed (including adjusting bedclothes, sheets and blankets)?: A Little Difficulty moving from lying on back to sitting on the side of the bed? : A Little Difficulty sitting down on and standing up from a chair with arms (e.g., wheelchair, bedside commode, etc,.)?: A Little Help needed moving to and from a bed to chair (including a wheelchair)?: A Little Help needed walking in hospital room?: A Little Help needed climbing 3-5 steps with a railing? : A Little 6 Click Score: 18    End of Session Equipment Utilized During Treatment: Gait belt Activity Tolerance: Patient  tolerated treatment well;Other (comment) Patient left: in chair;with call bell/phone within reach Nurse Communication: Mobility status PT Visit Diagnosis: Difficulty in walking, not elsewhere classified (R26.2);Pain Pain - Right/Left: Left Pain - part of body: Knee     Time: 7902-4097 PT Time Calculation (min) (ACUTE ONLY): 16 min  Charges:  $Gait Training: 8-22 mins                    G Codes:       Rica Koyanagi  PTA [facility]  Acute  Rehab Pager      [phone]

## 2017-02-09 NOTE — Progress Notes (Signed)
Pt's co-pay for Xarelto is $37.00 once at Pharmacy and will need to mail order after that [place] [phone], information given to pt.

## 2017-02-09 NOTE — Discharge Summary (Signed)
Physician Discharge Summary  [name] WCB:[id number] DOB: 18-Jan-1951 DOA: 01/25/2017  PCP: Remi Haggard, FNP  Admit date: 01/25/2017 Discharge date: 02/09/2017  Admitted From: home  Disposition:  Home  Recommendations for Outpatient Follow-up:  1. Follow up with PCP in 1-2 weeks 2. Follow up with Dr. Wynelle Link as scheduled 3. Follow up with Cardiology in 7 days as scheduled   Home Health: PT, aide Equipment/Devices: walker  Discharge Condition: Stable CODE STATUS: Full Diet recommendation: Soft diet  HPI:   66 year old male with history of type 2 diabetes mellitus, hyperlipidemia, hypertension, COPD, CAD, who was admitted on orthopedic surgery service with left knee osteoarthritis and underwent left total knee arthroplasty on 01/25/2017.  Patient has been progressively nauseous and has been complaining of abdominal distention over the last 24 hours, this was associated with orthostatic hypotension requiring several IV fluid boluses.  He denies any chest pain or palpitations.  He denies any fevers or chills.  He does have a cough with minimal sputum production, and that is somewhat chronic.  He reports he has not had a bowel movement in several days, however he is passing gas.  He has a history of intermittent chest pains for which she was admitted on cardiology service in May 2018, underwent a cardiac catheterization which was negative for significant coronary artery disease.  He also has COPD followed by Dr. Annamaria Boots with pulmonology (patient never smoked).  He does have history of extensive intra-abdominal surgery (appendicectomy, Bilroth II), and has had an episode of SBO in 2015.  His surgeon is Dr. Harlow Asa.   Hospital Course: Discharge Diagnoses:  Principal Problem:   Adynamic ileus ([place]) Active Problems:   Essential hypertension   OA (osteoarthritis) of knee   Mild aortic stenosis by prior echocardiogram   H/O major abdominal surgery   COPD mixed type (HCC)     Abdominal pain, nausea vomiting secondary to post-op ileus -patient was already admitted on the orthopedic surgery, and a few days postop he developed abdominal distention, nausea vomiting. Imaging showed dilated loops of small bowl without evidence of obstruction, suggesting ileus, and patient was treated conservatively with NG tube, IV fluids and n.p.o status.  Patient improved over the course of 4 days there for the NG tube was clamped and he was allowed to try progressing his diet.  His ileus developed again, and patient began to experience worsening nausea and distention and general surgery was consulted.  He eventually improved his NG tube was discontinued, and his diet was advanced.  Patient was seen by general surgery on the day of discharge, and he is okay to go home. COPD - Continue home medications, no wheezing HTN / Hypotension -patient had a hypotensive episode on 9/9, initial concerns were for infection as his white count was increased and was started on broad-spectrum antibiotics and was cultured.  His low blood pressure resolved, and the next day had another episode of hypotension at the same time, correlating with the Coreg administration, suggesting hypotension was medication induced.  His cultures have remained negative and broad-spectrum antibiotics were discontinued. Cellulitis -patient had an IV infiltrate in the right wrist, this area became red and erythematous.  He will be covered empirically with Keflex for a few days. Atrial fibrillation with RVR -On 9/3 this patient developed Afib with RVR which required transfer to stepdown.  Cardiology was consulted and followed the patient.  Rates were difficult to control and patient was placed on amiodarone infusion and eventually converted back to sinus  rhythm.  CHA2DS2-VASc Score for Stroke Risk is 4, he was started on full dose lovenox while he was n.p.o., and this was transitioned to Xarelto on discharge.  He will need outpatient  follow-up with cardiology in office. This has already been scheduled Chest pain /pressure concern for coronary artery disease -Patient underwent cardiac cath 3 months ago which was negative for significant CAD.  EF was 60-65% on most recent echo in October 2017, which also showed mild aortic stenosis and mild regurgitation.  His troponin was slightly elevated and he experienced some chest pain during this hospitalization, likely due to demand ischemia. His chest pain resolved. Osteoarthritis left knee -patient was initially hospitalized on the orthopedic surgery service for elective left knee arthroplasty.  He will need outpatient follow-up with Dr. Wynelle Link AKI- On 9/1 the patient's creatinine increased to 3, and was found to have acute urinary retention requiring Foley catheter placement.  With that, and IV fluids, his creatinine has normalized.  Foley was discontinued and patient was able to void on his own without further difficulties.   Discharge Instructions  Discharge Instructions    Call MD / Call 911    Complete by:  As directed    If you experience chest pain or shortness of breath, CALL 911 and be transported to the hospital emergency room.  If you develope a fever above 101 F, pus (white drainage) or increased drainage or redness at the wound, or calf pain, call your surgeon's office.   Change dressing    Complete by:  As directed    Change dressing daily with sterile 4 x 4 inch gauze dressing and apply TED hose. Do not submerge the incision under water.   Constipation Prevention    Complete by:  As directed    Drink plenty of fluids.  Prune juice may be helpful.  You may use a stool softener, such as Colace (over the counter) 100 mg twice a day.  Use MiraLax (over the counter) for constipation as needed.   Diet - low sodium heart healthy    Complete by:  As directed    Discharge instructions    Complete by:  As directed    Take Xarelto for two and a half more weeks, then discontinue  Xarelto. Once the patient has completed the Xarelto, they may resume the 81 mg Aspirin.   Pick up stool softner and laxative for home use following surgery while on pain medications. Do not submerge incision under water. Please use good hand washing techniques while changing dressing each day. May shower starting three days after surgery. Please use a clean towel to pat the incision dry following showers. Continue to use ice for pain and swelling after surgery. Do not use any lotions or creams on the incision until instructed by your surgeon.  Wear both TED hose on both legs during the day every day for three weeks, but may remove the TED hose at night at home.  Postoperative Constipation Protocol  Constipation - defined medically as fewer than three stools per week and severe constipation as less than one stool per week.  One of the most common issues patients have following surgery is constipation.  Even if you have a regular bowel pattern at home, your normal regimen is likely to be disrupted due to multiple reasons following surgery.  Combination of anesthesia, postoperative narcotics, change in appetite and fluid intake all can affect your bowels.  In order to avoid complications following surgery, here are some recommendations  in order to help you during your recovery period.  Colace (docusate) - Pick up an over-the-counter form of Colace or another stool softener and take twice a day as long as you are requiring postoperative pain medications.  Take with a full glass of water daily.  If you experience loose stools or diarrhea, hold the colace until you stool forms back up.  If your symptoms do not get better within 1 week or if they get worse, check with your doctor.  Dulcolax (bisacodyl) - Pick up over-the-counter and take as directed by the product packaging as needed to assist with the movement of your bowels.  Take with a full glass of water.  Use this product as needed if not  relieved by Colace only.   MiraLax (polyethylene glycol) - Pick up over-the-counter to have on hand.  MiraLax is a solution that will increase the amount of water in your bowels to assist with bowel movements.  Take as directed and can mix with a glass of water, juice, soda, coffee, or tea.  Take if you go more than two days without a movement. Do not use MiraLax more than once per day. Call your doctor if you are still constipated or irregular after using this medication for 7 days in a row.  If you continue to have problems with postoperative constipation, please contact the office for further assistance and recommendations.  If you experience "the worst abdominal pain ever" or develop nausea or vomiting, please contact the office immediatly for further recommendations for treatment.   Do not put a pillow under the knee. Place it under the heel.    Complete by:  As directed    Do not sit on low chairs, stoools or toilet seats, as it may be difficult to get up from low surfaces    Complete by:  As directed    Driving restrictions    Complete by:  As directed    No driving until released by the physician.   Increase activity slowly as tolerated    Complete by:  As directed    Lifting restrictions    Complete by:  As directed    No lifting until released by the physician.   Patient may shower    Complete by:  As directed    You may shower without a dressing once there is no drainage.  Do not wash over the wound.  If drainage remains, do not shower until drainage stops.   TED hose    Complete by:  As directed    Use stockings (TED hose) for 3 weeks on both leg(s).  You may remove them at night for sleeping.   Weight bearing as tolerated    Complete by:  As directed    Laterality:  left   Extremity:  Lower     Allergies as of 02/09/2017      Reactions   Bee Venom Anaphylaxis   Codeine Shortness Of Breath, Rash   Doxycycline Shortness Of Breath, Rash   Ivp Dye [iodinated Diagnostic  Agents] Shortness Of Breath   Lost consciousness/difficulty breathing Omni - Paque Contrast    Levaquin [levofloxacin] Hives, Shortness Of Breath   Oxycodone Hcl Shortness Of Breath, Swelling, Rash   Stadol [butorphanol] Shortness Of Breath   Succinylcholine Chloride Shortness Of Breath, Nausea Only, Rash, Other (See Comments)   Difficulty breathing   Atorvastatin    myalgias   Bystolic [nebivolol Hcl] Other (See Comments)   Nightmares, flashbacks (PTSD)  Iohexol     Desc: hives,neck and torso erythemia   Methocarbamol Nausea Only   rash   Pentazocine Lactate    rash   Dilaudid [hydromorphone Hcl] Nausea Only, Rash   Aggressive   Synvisc [hylan G-f 20] Hives, Rash      Medication List    STOP taking these medications   aspirin 81 MG chewable tablet   CENTRUM SILVER PO   isosorbide mononitrate 30 MG 24 hr tablet Commonly known as:  IMDUR   predniSONE 20 MG tablet Commonly known as:  DELTASONE   sulfamethoxazole-trimethoprim 800-160 MG tablet Commonly known as:  BACTRIM DS,SEPTRA DS   Vitamin D 2000 units tablet   vitamin E 400 UNIT capsule Generic drug:  vitamin E     TAKE these medications   albuterol 108 (90 Base) MCG/ACT inhaler Commonly known as:  PROVENTIL HFA Inhale 2 puffs into the lungs every 4 (four) hours as needed. For wheezing and/or shortness of breath   albuterol (2.5 MG/3ML) 0.083% nebulizer solution Commonly known as:  PROVENTIL Take 3 mLs (2.5 mg total) by nebulization every 6 (six) hours as needed for wheezing or shortness of breath.   ALPRAZolam 1 MG tablet Commonly known as:  XANAX Take 1 tablet (1 mg total) by mouth 3 (three) times daily as needed for anxiety or sleep. What changed:  how much to take  when to take this  reasons to take this   amiodarone 200 MG tablet Commonly known as:  PACERONE Take 1 tablet (200 mg total) by mouth 2 (two) times daily.   beclomethasone 42 MCG/SPRAY nasal spray Commonly known as:   BECONASE-AQ Place 1 spray into both nostrils daily. Dose is for each nostril.   benzonatate 100 MG capsule Commonly known as:  TESSALON Take 100 mg by mouth 3 (three) times daily as needed for cough (for wheezing/cough).   budesonide-formoterol 160-4.5 MCG/ACT inhaler Commonly known as:  SYMBICORT 2 puffs then rinse mouth well, twice daily   carvedilol 6.25 MG tablet Commonly known as:  COREG Take 1 tablet (6.25 mg total) by mouth 2 (two) times daily with a meal. What changed:  medication strength  how much to take  how to take this  when to take this  additional instructions   cephALEXin 500 MG capsule Commonly known as:  KEFLEX Take 1 capsule (500 mg total) by mouth 4 (four) times daily.   cyclobenzaprine 5 MG tablet Commonly known as:  FLEXERIL Take 1 tablet (5 mg total) by mouth 3 (three) times daily as needed (for muscle cramps).   diltiazem 180 MG 24 hr capsule Commonly known as:  CARDIZEM CD Take 1 capsule (180 mg total) by mouth daily.   famotidine 20 MG tablet Commonly known as:  PEPCID Take 1 tablet (20 mg total) by mouth daily. 1 TAB (20MG ) @ HS ON 5-23 AND 5-24 AM BEFORE PROCEDURE   FLUTTER Devi Use as directed   furosemide 20 MG tablet Commonly known as:  LASIX Take 1 tablet (20 mg total) by mouth daily. MAY TAKE ADDITIONAL TABLET AS NEEDED SWELLING OR SHORTNESS OF BREATH What changed:  when to take this  reasons to take this  additional instructions   hydrALAZINE 25 MG tablet Commonly known as:  APRESOLINE Take 25 mg by mouth daily as needed (for high blood pressure at or above 150).   irbesartan-hydrochlorothiazide 300-12.5 MG tablet Commonly known as:  AVALIDE TAKE 1 TABLET BY MOUTH DAILY.   magnesium hydroxide 400 MG/5ML suspension Commonly known  as:  MILK OF MAGNESIA Take 15 mLs by mouth 3 (three) times daily as needed for mild constipation or indigestion.   morphine 30 MG tablet Commonly known as:  MSIR Take 1-2 tablets (30-60  mg total) by mouth every 4 (four) hours as needed for moderate pain. What changed:  how much to take  reasons to take this  additional instructions   nitroGLYCERIN 0.4 MG SL tablet Commonly known as:  NITROSTAT Place 1 tablet (0.4 mg total) under the tongue every 5 (five) minutes as needed for chest pain.   polyethylene glycol packet Commonly known as:  MIRALAX / GLYCOLAX Take 17 g by mouth 2 (two) times daily as needed for moderate constipation.   potassium chloride SA 20 MEQ tablet Commonly known as:  K-DUR,KLOR-CON Take 1 tablet (20 mEq total) by mouth daily.   promethazine 25 MG tablet Commonly known as:  PHENERGAN Take 1 tablet (25 mg total) by mouth every 6 (six) hours as needed. FOR NAUSEA What changed:  reasons to take this  additional instructions   rivaroxaban 20 MG Tabs tablet Commonly known as:  XARELTO Take 1 tablet (20 mg total) by mouth daily with supper.   SYMPROIC 0.2 MG Tabs Generic drug:  Naldemedine Tosylate Take 0.2 mg by mouth daily at 12 noon.   traMADol 50 MG tablet Commonly known as:  ULTRAM Take 1-2 tablets (50-100 mg total) by mouth every 6 (six) hours as needed for moderate pain.            Durable Medical Equipment        Start     Ordered   02/08/17 1019  For home use only DME Bedside commode  Once    Question:  Patient needs a bedside commode to treat with the following condition  Answer:  Mobility poor   02/08/17 1019   01/26/17 1059  For home use only DME Walker rolling  Once    Question:  Patient needs a walker to treat with the following condition  Answer:  S/P knee replacement   01/26/17 1058       Discharge Care Instructions        Start     Ordered   02/10/17 0000  diltiazem (CARDIZEM CD) 180 MG 24 hr capsule  Daily     02/09/17 1333   02/09/17 0000  amiodarone (PACERONE) 200 MG tablet  2 times daily     02/09/17 1333   02/09/17 0000  carvedilol (COREG) 6.25 MG tablet  2 times daily with meals     02/09/17  1333   02/09/17 0000  rivaroxaban (XARELTO) 20 MG TABS tablet  Daily with supper     02/09/17 1333   02/09/17 0000  cephALEXin (KEFLEX) 500 MG capsule  4 times daily     02/09/17 1334   02/09/17 0000  potassium chloride SA (K-DUR,KLOR-CON) 20 MEQ tablet  Daily     02/09/17 1338   01/26/17 0000  cyclobenzaprine (FLEXERIL) 5 MG tablet  3 times daily PRN    Question:  Supervising Provider  Answer:  Gaynelle Arabian   01/26/17 2106   01/26/17 0000  morphine (MSIR) 30 MG tablet  Every 4 hours PRN    Question:  Supervising Provider  Answer:  Gaynelle Arabian   01/26/17 2106   01/26/17 0000  traMADol (ULTRAM) 50 MG tablet  Every 6 hours PRN    Question:  Supervising Provider  Answer:  Gaynelle Arabian   01/26/17 2106   01/26/17 0000  Call MD / Call 911    Comments:  If you experience chest pain or shortness of breath, CALL 911 and be transported to the hospital emergency room.  If you develope a fever above 101 F, pus (white drainage) or increased drainage or redness at the wound, or calf pain, call your surgeon's office.   01/26/17 2106   01/26/17 0000  Discharge instructions    Comments:  Take Xarelto for two and a half more weeks, then discontinue Xarelto. Once the patient has completed the Xarelto, they may resume the 81 mg Aspirin.   Pick up stool softner and laxative for home use following surgery while on pain medications. Do not submerge incision under water. Please use good hand washing techniques while changing dressing each day. May shower starting three days after surgery. Please use a clean towel to pat the incision dry following showers. Continue to use ice for pain and swelling after surgery. Do not use any lotions or creams on the incision until instructed by your surgeon.  Wear both TED hose on both legs during the day every day for three weeks, but may remove the TED hose at night at home.  Postoperative Constipation Protocol  Constipation - defined medically as fewer than  three stools per week and severe constipation as less than one stool per week.  One of the most common issues patients have following surgery is constipation.  Even if you have a regular bowel pattern at home, your normal regimen is likely to be disrupted due to multiple reasons following surgery.  Combination of anesthesia, postoperative narcotics, change in appetite and fluid intake all can affect your bowels.  In order to avoid complications following surgery, here are some recommendations in order to help you during your recovery period.  Colace (docusate) - Pick up an over-the-counter form of Colace or another stool softener and take twice a day as long as you are requiring postoperative pain medications.  Take with a full glass of water daily.  If you experience loose stools or diarrhea, hold the colace until you stool forms back up.  If your symptoms do not get better within 1 week or if they get worse, check with your doctor.  Dulcolax (bisacodyl) - Pick up over-the-counter and take as directed by the product packaging as needed to assist with the movement of your bowels.  Take with a full glass of water.  Use this product as needed if not relieved by Colace only.   MiraLax (polyethylene glycol) - Pick up over-the-counter to have on hand.  MiraLax is a solution that will increase the amount of water in your bowels to assist with bowel movements.  Take as directed and can mix with a glass of water, juice, soda, coffee, or tea.  Take if you go more than two days without a movement. Do not use MiraLax more than once per day. Call your doctor if you are still constipated or irregular after using this medication for 7 days in a row.  If you continue to have problems with postoperative constipation, please contact the office for further assistance and recommendations.  If you experience "the worst abdominal pain ever" or develop nausea or vomiting, please contact the office immediatly for further  recommendations for treatment.   01/26/17 2106   01/26/17 0000  Diet - low sodium heart healthy     01/26/17 2106   01/26/17 0000  Constipation Prevention    Comments:  Drink plenty of fluids.  Prune juice  may be helpful.  You may use a stool softener, such as Colace (over the counter) 100 mg twice a day.  Use MiraLax (over the counter) for constipation as needed.   01/26/17 2106   01/26/17 0000  Increase activity slowly as tolerated     01/26/17 2106   01/26/17 0000  Patient may shower    Comments:  You may shower without a dressing once there is no drainage.  Do not wash over the wound.  If drainage remains, do not shower until drainage stops.   01/26/17 2106   01/26/17 0000  Weight bearing as tolerated    Question Answer Comment  Laterality left   Extremity Lower      01/26/17 2106   01/26/17 0000  Driving restrictions    Comments:  No driving until released by the physician.   01/26/17 2106   01/26/17 0000  Lifting restrictions    Comments:  No lifting until released by the physician.   01/26/17 2106   01/26/17 0000  TED hose    Comments:  Use stockings (TED hose) for 3 weeks on both leg(s).  You may remove them at night for sleeping.   01/26/17 2106   01/26/17 0000  Change dressing    Comments:  Change dressing daily with sterile 4 x 4 inch gauze dressing and apply TED hose. Do not submerge the incision under water.   01/26/17 2106   01/26/17 0000  Do not put a pillow under the knee. Place it under the heel.     01/26/17 2106   01/26/17 0000  Do not sit on low chairs, stoools or toilet seats, as it may be difficult to get up from low surfaces     01/26/17 2106     Follow-up Information    [facility].   Why:  physical therapy Contact information: [place] [address] [place] Follow up.   Why:  walker Contact information: [place]  [address] [phone]        Gaynelle Arabian, MD. Schedule an appointment as soon as possible for a visit on 02/09/2017.   Specialty:  Orthopedic Surgery Contact information: [address] [place] [address] [phone]        Leonie Man, MD. Schedule an appointment as soon as possible for a visit in 3 week(s).   Specialty:  Cardiology Contact information: [address] [place] [address] [place] [address] [phone]          Allergies  Allergen Reactions  . Bee Venom Anaphylaxis  . Codeine Shortness Of Breath and Rash  . Doxycycline Shortness Of Breath and Rash  . Ivp Dye [Iodinated Diagnostic Agents] Shortness Of Breath    Lost consciousness/difficulty breathing Omni - Paque Contrast   . Levaquin [Levofloxacin] Hives and Shortness Of Breath  . Oxycodone Hcl Shortness Of Breath, Swelling and Rash  . Stadol [Butorphanol] Shortness Of Breath  . Succinylcholine Chloride Shortness Of Breath, Nausea Only, Rash and Other (See Comments)    Difficulty breathing   . Atorvastatin     myalgias  . Bystolic [Nebivolol Hcl] Other (See Comments)    Nightmares, flashbacks (PTSD)  . Iohexol      Desc: hives,neck and torso erythemia   . Methocarbamol Nausea Only    rash  . Pentazocine Lactate     rash  . Dilaudid [Hydromorphone  Hcl] Nausea Only and Rash    Aggressive   . Synvisc [Hylan G-F 20] Hives and Rash    Consultations:  Orthopedic Surgery  Cardiology  General Surgery  Procedures/Studies:  2D echo Study Conclusions - Left ventricle: The cavity size was normal. Wall thickness wasincreased in a pattern of moderate LVH. Systolic function wasvigorous. The estimated ejection fraction was in the range of 65%to 70%. There was dynamic obstruction at rest, with a peakvelocity of 210 cm/sec and a peak gradient of 18 mm Hg. Wallmotion was normal; there were no regional wall motionabnormalities. - Aortic valve: Valve mobility was  restricted, worse in thenon-coronary cusp. There was moderate stenosis. There was mildregurgitation. Peak velocity (S): 340 cm/s. Mean gradient (S): 94mm Hg. Valve area (VTI): 1.34 cm^2. Valve area (Vmax): 1.39 cm^2.Valve area (Vmean): 1.38 cm^2. - Mitral valve: Transvalvular velocity was within the normal range.There was no evidence for stenosis. There was no regurgitation. - Left atrium: The atrium was mildly dilated. - Right ventricle: The cavity size was normal. Wall thickness wasnormal. Systolic function was normal. - Tricuspid valve: There was trivial regurgitation.  Dg Chest 2 View  Result Date: 02/07/2017 CLINICAL DATA:  Patient with low blood pressure. Abdominal distension. EXAM: CHEST  2 VIEW COMPARISON:  Chest radiograph 02/01/2017 FINDINGS: Monitoring leads overlie the patient. Stable enlarged cardiac and mediastinal contours. Low lung volumes. Basilar atelectasis. No pleural effusion or pneumothorax. IMPRESSION: Low lung volumes with basilar atelectasis. Electronically Signed   By: Lovey Newcomer M.D.   On: 02/07/2017 14:59   Dg Chest Port 1 View  Result Date: 02/01/2017 CLINICAL DATA:  Chest pain intermittently for the past year, worse today. EXAM: PORTABLE CHEST 1 VIEW COMPARISON:  01/31/2017. FINDINGS: Normal sized heart. Clear lungs. Nasogastric tube tip in the proximal stomach. Stable surgical clips at the gastroesophageal junction. IMPRESSION: No acute abnormality. Electronically Signed   By: Claudie Revering M.D.   On: 02/01/2017 16:26   Dg Chest Port 1 View  Result Date: 01/31/2017 CLINICAL DATA:  Shortness of breath and hypoxia.  COPD. EXAM: PORTABLE CHEST 1 VIEW COMPARISON:  01/30/2017 FINDINGS: Nasogastric tube extends into the stomach. The lungs appear clear. Upper normal heart size. Atherosclerotic calcification of the aortic arch. No blunting of the costophrenic angles. IMPRESSION: 1. Nasogastric tube tip:  Stomach. 2.  Aortic Atherosclerosis (ICD10-I70.0). Electronically  Signed   By: Van Clines M.D.   On: 01/31/2017 13:18   Dg Chest Port 1 View  Result Date: 01/30/2017 CLINICAL DATA:  NG tube placement. EXAM: PORTABLE CHEST 1 VIEW COMPARISON:  One-view chest x-ray 01/29/2017. FINDINGS: The heart size is exaggerate by low lung volumes. Pulmonary vascular congestion has increased. No focal airspace disease is present. There are no effusions. The tip of the NG tube is at the thoracic inlet. IMPRESSION: 1. The tip of the NG tube is at the thoracic inlet. 2. Increased pulmonary vascular congestion. Electronically Signed   By: San Morelle M.D.   On: 01/30/2017 08:39   Dg Chest Port 1 View  Result Date: 01/29/2017 CLINICAL DATA:  Nausea and vomiting. EXAM: PORTABLE CHEST 1 VIEW COMPARISON:  07/31/2016. FINDINGS: Mediastinum and hilar structures normal. No focal infiltrate. No pleural effusion or pneumothorax. IMPRESSION: No acute cardiopulmonary disease. Electronically Signed   By: Marcello Moores  Register   On: 01/29/2017 09:05   Dg Abd 2 Views  Result Date: 02/07/2017 CLINICAL DATA:  Acute abdominal distension. EXAM: ABDOMEN - 2 VIEW COMPARISON:  Radiographs of February 04, 2017. FINDINGS: Status post cholecystectomy. Moderately  dilated small bowel loops are noted concerning for distal small bowel obstruction. No definite colonic dilatation is noted. No free air is noted. IMPRESSION: Mildly dilated small bowel loops are noted concerning for distal small bowel obstruction. Continued radiographic follow-up is recommended. Electronically Signed   By: Marijo Conception, M.D.   On: 02/07/2017 14:55   Dg Abd Portable 1v  Result Date: 02/04/2017 CLINICAL DATA:  66 year old male with bowel obstruction versus ileus. EXAM: PORTABLE ABDOMEN - 1 VIEW COMPARISON:  02/03/2017 and earlier. FINDINGS: Portable AP supine views at 811 hours. Lung bases remain clear. Stable enteric tube looped in the proximal stomach. Stable right upper quadrant and epigastric surgical clips. Gas  distended loops of small and large bowel in the abdomen arm slightly less numerous than 01/30/2017. Left upper quadrant small bowel still measures up to 16 mm diameter. The large bowel is less distended. No definite pneumoperitoneum on these supine views. No acute osseous abnormality identified. IMPRESSION: 1. Stable NG tube.  Negative lung bases. 2. Minimally improved bowel gas pattern since 01/30/2017. Electronically Signed   By: Genevie Ann M.D.   On: 02/04/2017 08:37   Dg Abd Portable 1v  Result Date: 02/02/2017 CLINICAL DATA:  Ileus. EXAM: PORTABLE ABDOMEN - 1 VIEW COMPARISON:  01/30/2017 FINDINGS: An enteric tube has been placed with tip and side hole projecting over the gastric fundus. Surgical clips are noted in the upper abdomen. Gas-filled dilated loops of small bowel are again seen throughout the abdomen with overall mild improvement of bowel distention compared to the prior study. Gas and stool are present in the colon. The visualized lung bases are clear. No acute osseous abnormality is seen. IMPRESSION: Slight improvement of bowel dilatation suggesting ileus. Electronically Signed   By: Logan Bores M.D.   On: 02/02/2017 19:14   Dg Abd Portable 1v  Result Date: 01/30/2017 CLINICAL DATA:  NG tube verification. EXAM: PORTABLE ABDOMEN - 1 VIEW COMPARISON:  Abdominal radiographs 01/29/2017. FINDINGS: The NG tube is not visible in the lower chest or abdomen. Multiple dilated loops of small bowel are noted, worse on the left. Dilation measures up to 6.1 cm. IMPRESSION: 1. Dilated loops of small bowel without evidence for obstruction. Findings are most compatible with ileus. 2. NG tube is not visualized in the lower chest or abdomen. Electronically Signed   By: San Morelle M.D.   On: 01/30/2017 08:38   Dg Abd Portable 2v  Result Date: 02/08/2017 CLINICAL DATA:  Small bowel obstruction EXAM: PORTABLE ABDOMEN - 2 VIEW COMPARISON:  Yesterday FINDINGS: Gaseous distention of bowel, predominantly  proximal small bowel. Some colonic gas and stool is also present, stool mainly seen along the transverse colon. The stomach is gas distended with a fluid level on decubitus imaging. No evidence of pneumoperitoneum. Pneumobilia, with cholecystectomy clips suggesting previous sphincterotomy. IMPRESSION: Mildly improved gaseous distension of bowel. No evidence of pneumoperitoneum. Electronically Signed   By: Monte Fantasia M.D.   On: 02/08/2017 06:56   Dg Abd Portable 2v  Result Date: 02/03/2017 CLINICAL DATA:  Ileus.  Recent left total knee arthroplasty. EXAM: PORTABLE ABDOMEN - 2 VIEW COMPARISON:  02/02/2017 abdominal radiographs FINDINGS: Enteric tube terminates in the gastric fundus. Mild bibasilar atelectasis. Surgical clips overlie the upper abdomen bilaterally. Diffuse dilatation of small and large bowel throughout the abdomen with small bowel loops measuring up to 6.2 cm diameter in the left abdomen, not appreciably changed. Moderate stool in the proximal colon. No evidence of pneumatosis or pneumoperitoneum. No radiopaque urolithiasis. IMPRESSION:  1. Enteric tube terminates in the gastric fundus . 2. No appreciable change in diffuse dilatation of small and large bowel loops throughout the abdomen, compatible with adynamic ileus. Electronically Signed   By: Ilona Sorrel M.D.   On: 02/03/2017 08:30   Dg Abd Portable 2v  Result Date: 01/29/2017 CLINICAL DATA:  Ileus. EXAM: PORTABLE ABDOMEN - 2 VIEW COMPARISON:  07/31/2016 FINDINGS: Dilated loops of bowel gas throughout the abdomen. There is evidence for gas in both the small and large bowel. No evidence for free air on the decubitus image. Surgical clips in the upper abdomen. IMPRESSION: Gas-filled loops of bowel throughout the abdomen. Findings are suggestive for an ileus. Electronically Signed   By: Markus Daft M.D.   On: 01/29/2017 08:55   Dg Addison Bailey G Tube Plc W/fl W/rad  Result Date: 01/29/2017 CLINICAL DATA:  Abdominal distention. EXAM: NASO G TUBE  PLACEMENT WITH FL AND WITH RAD FLUOROSCOPY TIME:  Fluoroscopy Time:  0 minutes 50 seconds Radiation Exposure Index (if provided by the fluoroscopic device): 17.3 mGy COMPARISON:  None. FINDINGS: I inserted a nasogastric tube through the right side of the nose and advanced the tube into the stomach without difficulty using fluoroscopic monitoring. 1900 cc of fluid was suctioned from the NG tube after placement. IMPRESSION: NG tube placed in the stomach without difficulty. Mr. Puebla tolerated the procedure well. Electronically Signed   By: Lorriane Shire M.D.   On: 01/29/2017 12:31      Subjective: The patient reports that he is tolerating his full liquid diet well. He is passing gas and denies nausea, vomiting, fevers, abdominal pain and abdominal distention. He also denies chest pain, shortness of breath, He expresses concern about getting home as soon as possible to help his wife prepare for the upcoming inclement weather.    Discharge Exam: Vitals:   02/09/17 0620 02/09/17 0854  BP: (!) 156/74   Pulse: 65   Resp: 20   Temp: 97.8 F (36.6 C)   SpO2: 96% 97%   Vitals:   02/08/17 2115 02/08/17 2139 02/09/17 0620 02/09/17 0854  BP:  120/65 (!) 156/74   Pulse:  65 65   Resp:  20 20   Temp:  97.7 F (36.5 C) 97.8 F (36.6 C)   TempSrc:  Oral Oral   SpO2: 98% 100% 96% 97%  Weight:      Height:         The results of significant diagnostics from this hospitalization (including imaging, microbiology, ancillary and laboratory) are listed below for reference.     Microbiology: Recent Results (from the past 240 hour(s))  MRSA PCR Screening     Status: None   Collection Time: 02/01/17  1:46 PM  Result Value Ref Range Status   MRSA by PCR NEGATIVE NEGATIVE Final    Comment:        The GeneXpert MRSA Assay (FDA approved for NASAL specimens only), is one component of a comprehensive MRSA colonization surveillance program. It is not intended to diagnose MRSA infection nor to guide  or monitor treatment for MRSA infections.   Culture, blood (Routine X 2) w Reflex to ID Panel     Status: None (Preliminary result)   Collection Time: 02/07/17  1:05 PM  Result Value Ref Range Status   Specimen Description BLOOD LEFT HAND  Final   Special Requests IN PEDIATRIC BOTTLE Blood Culture adequate volume  Final   Culture   Final    NO GROWTH 2 DAYS Performed at  [facility], [place] [address] [place], [place] [address]    Report Status PENDING  Incomplete  Culture, blood (Routine X 2) w Reflex to ID Panel     Status: None (Preliminary result)   Collection Time: 02/07/17  5:50 PM  Result Value Ref Range Status   Specimen Description BLOOD LEFT ANTECUBITAL  Final   Special Requests   Final    BOTTLES DRAWN AEROBIC AND ANAEROBIC Blood Culture adequate volume   Culture   Final    NO GROWTH 2 DAYS Performed at [facility], [place] [address], [place], [place] [address]    Report Status PENDING  Incomplete     Labs: BNP (last 3 results) No results for input(s): BNP in the last 8760 hours. Basic Metabolic Panel:  Recent Labs Lab 02/05/17 0328 02/06/17 0747 02/07/17 0450 02/08/17 0420 02/09/17 0825  NA 135 132* 134* 135 133*  K 3.5 3.2* 4.4 3.6 3.5  CL 102 98* 96* 100* 102  CO2 24 25 29 26  21*  GLUCOSE 119* 108* 127* 120* 127*  BUN 17 17 14 13 8   CREATININE 0.57* 0.63 0.62 0.66 0.71  CALCIUM 8.7* 8.8* 9.0 8.5* 8.5*   Liver Function Tests:  Recent Labs Lab 02/05/17 0328 02/08/17 0420  AST 51* 101*  ALT 73* 169*  ALKPHOS 83 102  BILITOT 1.5* 1.4*  PROT 6.0* 5.8*  ALBUMIN 2.7* 2.7*   No results for input(s): LIPASE, AMYLASE in the last 168 hours. No results for input(s): AMMONIA in the last 168 hours. CBC:  Recent Labs Lab 02/05/17 0328 02/06/17 0747 02/07/17 0450 02/08/17 0420 02/09/17 0825  WBC 18.1* 12.2* 21.7* 11.9* 10.8*  HGB 11.3* 12.1* 11.8* 11.0* 11.2*  HCT 34.2* 34.9* 35.3* 32.4* 32.4*  MCV 87.0 87.3 87.6 85.0 85.7  PLT 549*  287 592* 474* 509*   Cardiac Enzymes:  Recent Labs Lab 02/02/17 1952  TROPONINI 0.14*   BNP: Invalid input(s): POCBNP CBG: No results for input(s): GLUCAP in the last 168 hours. D-Dimer No results for input(s): DDIMER in the last 72 hours. Hgb A1c No results for input(s): HGBA1C in the last 72 hours. Lipid Profile No results for input(s): CHOL, HDL, LDLCALC, TRIG, CHOLHDL, LDLDIRECT in the last 72 hours. Thyroid function studies No results for input(s): TSH, T4TOTAL, T3FREE, THYROIDAB in the last 72 hours.  Invalid input(s): FREET3 Anemia work up No results for input(s): VITAMINB12, FOLATE, FERRITIN, TIBC, IRON, RETICCTPCT in the last 72 hours. Urinalysis    Component Value Date/Time   COLORURINE ORANGE (A) 01/16/2014 1146   APPEARANCEUR TURBID (A) 01/16/2014 1146   LABSPEC 1.031 (H) 01/16/2014 1146   PHURINE 5.5 01/16/2014 1146   GLUCOSEU NEGATIVE 01/16/2014 1146   GLUCOSEU NEGATIVE 02/02/2007 0936   HGBUR NEGATIVE 01/16/2014 1146   BILIRUBINUR negative 12/10/2015 1503   KETONESUR 15 (A) 01/16/2014 1146   PROTEINUR negative 12/10/2015 1503   PROTEINUR NEGATIVE 01/16/2014 1146   UROBILINOGEN 2.0 12/10/2015 1503   UROBILINOGEN 1.0 01/16/2014 1146   NITRITE negative 12/10/2015 1503   NITRITE NEGATIVE 01/16/2014 1146   LEUKOCYTESUR Negative 12/10/2015 1503   Sepsis Labs Invalid input(s): PROCALCITONIN,  WBC,  LACTICIDVEN Microbiology Recent Results (from the past 240 hour(s))  MRSA PCR Screening     Status: None   Collection Time: 02/01/17  1:46 PM  Result Value Ref Range Status   MRSA by PCR NEGATIVE NEGATIVE Final    Comment:        The GeneXpert MRSA Assay (FDA approved  for NASAL specimens only), is one component of a comprehensive MRSA colonization surveillance program. It is not intended to diagnose MRSA infection nor to guide or monitor treatment for MRSA infections.   Culture, blood (Routine X 2) w Reflex to ID Panel     Status: None (Preliminary  result)   Collection Time: 02/07/17  1:05 PM  Result Value Ref Range Status   Specimen Description BLOOD LEFT HAND  Final   Special Requests IN PEDIATRIC BOTTLE Blood Culture adequate volume  Final   Culture   Final    NO GROWTH 2 DAYS Performed at [facility], [place] [address], [place], [place] [address]    Report Status PENDING  Incomplete  Culture, blood (Routine X 2) w Reflex to ID Panel     Status: None (Preliminary result)   Collection Time: 02/07/17  5:50 PM  Result Value Ref Range Status   Specimen Description BLOOD LEFT ANTECUBITAL  Final   Special Requests   Final    BOTTLES DRAWN AEROBIC AND ANAEROBIC Blood Culture adequate volume   Culture   Final    NO GROWTH 2 DAYS Performed at [facility], [place] [address] [place], [place] [address]    Report Status PENDING  Incomplete     Time coordinating discharge: 30 minutes  SIGNED:  Janace Hoard, [place] 02/09/2017, 2:17 PM

## 2017-02-09 NOTE — Progress Notes (Signed)
Physical Therapy Treatment Patient Details Name: [name] MRN: [id number] DOB: Oct 11, 1950 Today's Date: 02/09/2017    History of Present Illness L TKR 01/25/17 post op Ileus and A Fib    PT Comments    POD # 15 am session Performed all supine TKR TE's following handout HEP.  Instructed on proper tech, freq as well as use of ICE after.    Follow Up Recommendations  DC plan and follow up therapy as arranged by surgeon;Supervision/Assistance - 24 hour     Equipment Recommendations  Rolling walker with 5" wheels;3in1 (PT)    Recommendations for Other Services       Precautions / Restrictions Precautions Precautions: Fall;Knee Restrictions Weight Bearing Restrictions: No Other Position/Activity Restrictions: WBAT          Cognition Arousal/Alertness: Awake/alert Behavior During Therapy: WFL for tasks assessed/performed Overall Cognitive Status: Within Functional Limits for tasks assessed                                        Exercises   Total Knee Replacement TE's 10 reps B LE ankle pumps 10 reps towel squeezes 10 reps knee presses 10 reps heel slides  10 reps SAQ's 10 reps SLR's 10 reps ABD Followed by ICE    General Comments        Pertinent Vitals/Pain Pain Assessment: No/denies pain    Home Living                      Prior Function            PT Goals (current goals can now be found in the care plan section) Progress towards PT goals: Progressing toward goals    Frequency    7X/week      PT Plan Current plan remains appropriate    Co-evaluation              AM-PAC PT "6 Clicks" Daily Activity  Outcome Measure  Difficulty turning over in bed (including adjusting bedclothes, sheets and blankets)?: A Little Difficulty moving from lying on back to sitting on the side of the bed? : A Little Difficulty sitting down on and standing up from a chair with arms (e.g., wheelchair, bedside commode, etc,.)?: A  Little Help needed moving to and from a bed to chair (including a wheelchair)?: A Little Help needed walking in hospital room?: A Little Help needed climbing 3-5 steps with a railing? : A Little 6 Click Score: 18    End of Session Equipment Utilized During Treatment: Gait belt Activity Tolerance: Patient tolerated treatment well;Other (comment) Patient left: in chair;with call bell/phone within reach Nurse Communication: Mobility status PT Visit Diagnosis: Difficulty in walking, not elsewhere classified (R26.2);Pain Pain - Right/Left: Left Pain - part of body: Knee     Time: 8341-9622 PT Time Calculation (min) (ACUTE ONLY): 28 min  Charges:  $Therapeutic Exercise: 23-37 mins                    G Codes:       [name]  PTA [facility]  Acute  Rehab Pager      [phone]

## 2017-02-09 NOTE — Progress Notes (Signed)
Nutrition Follow-up  DOCUMENTATION CODES:   Obesity unspecified  INTERVENTION:   Ensure Enlive po BID, each supplement provides 350 kcal and 20 grams of protein  NUTRITION DIAGNOSIS:   Inadequate oral intake related to inability to eat as evidenced by NPO status.  Ongoing- clear liquid  GOAL:   Patient will meet greater than or equal to 90% of their needs  Not meeting  MONITOR:   Diet advancement, Weight trends, Labs, I & O's  REASON FOR ASSESSMENT:   NPO/Clear Liquid Diet, LOS    ASSESSMENT:   66 year old male with history of type 2 diabetes mellitus, hyperlipidemia, hypertension, COPD, CAD, who was admitted on orthopedic surgery service with left knee osteoarthritis and underwent left total knee arthroplasty on 01/25/2017.  Patient has been progressively nauseous and has been complaining of abdominal distention. Pt found to have post-op ileus and R nare NGT placed on 9/1.   NGT has been removed, pt transferred out of ICU. Pt upgraded to full liquid diet today. Reports depending on his toleration of full liquid he may d/c this afternoon. Pt consumed 100% of breakfast this morning. Denies abdominal pain or nausea. Pt consuming Ensure Enlive twice per day. Wt noted to be down 2 lbs since admission.   Medications reviewed and include: IV abx Labs reviewed: Cl 100 (L) Albumin 2.7 (L) AST 101 (H) ALT 169 (H)   Diet Order:  Diet - low sodium heart healthy Diet clear liquid Room service appropriate? Yes; Fluid consistency: Thin  Skin:  Wound (see comment) (L knee incision site from 8/27)  Last BM:  02/06/17  Height:   Ht Readings from Last 1 Encounters:  02/01/17 6\' 1"  (1.854 m)    Weight:   Wt Readings from Last 1 Encounters:  02/03/17 229 lb 8 oz (104.1 kg)    Ideal Body Weight:  83.64 kg  BMI:  Body mass index is 30.28 kg/m.  Estimated Nutritional Needs:   Kcal:  6606-0045 (16-18 kcal/kg)  Protein:  90-100 grams  Fluid:  >/= 2 L/day  EDUCATION NEEDS:    No education needs identified at this time  [place], LDN Clinical Nutrition Pager # - [phone]

## 2017-02-09 NOTE — Care Management Important Message (Signed)
Important Message  Patient Details  Name: [name] MRN: [id number] Date of Birth: [birth year]   Medicare Important Message Given:  Yes    Kerin Salen 02/09/2017, 11:17 AMImportant Message  Patient Details  Name: [name] MRN: [id number] Date of Birth: 1951/02/06   Medicare Important Message Given:  Yes    Kerin Salen 02/09/2017, 11:17 AM

## 2017-02-09 NOTE — Progress Notes (Signed)
Subjective: 15 Days Post-Op Procedure(s) (LRB): LEFT TOTAL KNEE ARTHROPLASTY (Left) Patient reports pain as mild.   Patient seen in rounds with Dr. Wynelle Link. Patient is doing much better this morning.  Out of the ICU and NGT has been removed.  Taking fluids this morning. Plan is to go Home after hospital stay.  Objective: Vital signs in last 24 hours: Temp:  [97.7 F (36.5 C)-97.8 F (36.6 C)] 97.8 F (36.6 C) (09/11 0620) Pulse Rate:  [61-65] 65 (09/11 0620) Resp:  [20] 20 (09/11 0620) BP: (77-156)/(44-74) 156/74 (09/11 0620) SpO2:  [95 %-100 %] 96 % (09/11 0620)  Intake/Output from previous day:  Intake/Output Summary (Last 24 hours) at 02/09/17 0850 Last data filed at 02/09/17 0522  Gross per 24 hour  Intake          1556.67 ml  Output             1000 ml  Net           556.67 ml    Intake/Output this shift: No intake/output data recorded.  Labs:  Recent Labs  02/07/17 0450 02/08/17 0420  HGB 11.8* 11.0*    Recent Labs  02/07/17 0450 02/08/17 0420  WBC 21.7* 11.9*  RBC 4.03* 3.81*  HCT 35.3* 32.4*  PLT 592* 474*    Recent Labs  02/07/17 0450 02/08/17 0420  NA 134* 135  K 4.4 3.6  CL 96* 100*  CO2 29 26  BUN 14 13  CREATININE 0.62 0.66  GLUCOSE 127* 120*  CALCIUM 9.0 8.5*   No results for input(s): LABPT, INR in the last 72 hours.  EXAM General - Patient is Alert, Appropriate and Oriented Extremity - Neurovascular intact Sensation intact distally Intact pulses distally Dorsiflexion/Plantar flexion intact Dressing/Incision - clean, dry, no drainage Motor Function - intact, moving foot and toes well on exam.   Past Medical History:  Diagnosis Date  . Anemia   . Asthma   . Back pain, chronic, followed at pain clinic 11/25/2011  . Carotid artery stenosis 09/06/2014  . Chest pain with low risk for cardiac etiology 10/2011   Non-obstructive CAD by Cath; negative Lexiscan in 08/2011  . Clotting disorder (HCC)    LLE DVT (also small vessel)    . Colon polyps   . COPD (chronic obstructive pulmonary disease) ([place])   . Dementia    Mild  . Depression   . Dyslipidemia (high LDL; low HDL)    statin intolerant; on fibrate  . Edema leg    Wears compression stockings; 08/2012 dopplers - no DVT or thrombophlebitis; mild R Popliteal V reflux - no VNUS ablation candidate  . Fainting   . Family history of acute myocardial infarction. and premature CAD 11/25/2011  . GERD (gastroesophageal reflux disease)   . Hearing loss   . History of diabetes mellitus   . Hypertension    very labile  . Mild aortic stenosis by prior echocardiogram 03/2012   moderate sclerosis  . Nephrolithiasis   . Osteoarthritis   . Other idiopathic peripheral autonomic neuropathy    Agent orange  . PTSD (post-traumatic stress disorder) 07/24/2015   per patient approach from foot of bed to awake; do not apply any contricting pressure, also avoid approaching from behind with any loud noises   . Seizure ([place])   . Small bowel obstruction ([place]) 1990s, 2001, 2015   s/p multiple bowel surgeries; from war wounds  . Stroke ([place])   . Weakness     Assessment/Plan: 15  Days Post-Op Procedure(s) (LRB): LEFT TOTAL KNEE ARTHROPLASTY (Left) Principal Problem:   Adynamic ileus (HCC) Active Problems:   Essential hypertension   OA (osteoarthritis) of knee   Mild aortic stenosis by prior echocardiogram   H/O major abdominal surgery   COPD mixed type (HCC)  Estimated body mass index is 30.28 kg/m as calculated from the following:   Height as of this encounter: 6\' 1"  (1.854 m).   Weight as of this encounter: 104.1 kg (229 lb 8 oz). Up with therapy Home when medically stable.  Arlee Muslim, PA-C Orthopaedic Surgery 02/09/2017, 8:50 AM

## 2017-02-11 ENCOUNTER — Telehealth: Payer: Self-pay | Admitting: Cardiology

## 2017-02-11 NOTE — Telephone Encounter (Signed)
TOC Phone Call . Appt is 02/16/17 at 11:30am w/ [name] .[name] Please call .[name] Thanks

## 2017-02-11 NOTE — Telephone Encounter (Signed)
Patient contacted regarding discharge from [facility] on 02/09/17.    Patient understands to follow up with provider [name] PA on 02/16/17 at 11:30 at [facility].  Patient understands discharge instructions? yes  Patient understands medications and regiment? yes  Patient understands to bring all medications to this visit? yes

## 2017-02-12 LAB — CULTURE, BLOOD (ROUTINE X 2)
CULTURE: NO GROWTH
CULTURE: NO GROWTH
SPECIAL REQUESTS: ADEQUATE
Special Requests: ADEQUATE

## 2017-02-15 ENCOUNTER — Telehealth: Payer: Self-pay | Admitting: Cardiology

## 2017-02-15 NOTE — Telephone Encounter (Signed)
SPOKE TO PATIENT AND WIFE. WIFE STATES PATIENT TOOKE MEDIATIONS yesterday  @30  min to an hour- blood pressure dropped to 81/44  And come up until that evening 128/77.  patient states he takes  Amiodarone,carvedilol ,potassuim ,diltiazem, keflex,  Irsbesartan/hctz all in the morning . Instructed to take irbesartan at lunch time. Continue to monitor - notify extender at appointment on 02/17/17. Verbalized understading

## 2017-02-15 NOTE — Telephone Encounter (Signed)
°  New Message   pt wife verbalized that she is calling for rn

## 2017-02-16 ENCOUNTER — Ambulatory Visit: Payer: Medicare Other | Admitting: Physician Assistant

## 2017-02-16 NOTE — Telephone Encounter (Signed)
Returned call to patient's wife (DPR) regarding patient of Dr. Ellyn Hack. She wants to know at this point after his recent hospitalization how his functional status should be. She wants to know how long he should sleep, if she needs a nap schedule, she tries to give him 6 meals/daily to hopefully avoid an ileus. Advised that I cannot determine what his functional status should be. Informed her that PA can review this with them at [place] on 9/19 in detail. Advised a GI MD can probably advise further on meals regarding ileus. Informed her that these overall concerns are probably more related to primary care.   Answered wife's questions and addressed concerns to best of ability.

## 2017-02-16 NOTE — Telephone Encounter (Signed)
Mrs. Deleonardis is calling to speak with you about  Mr. Range condition , she has more questions . Please call [phone]

## 2017-02-17 ENCOUNTER — Telehealth: Payer: Self-pay | Admitting: Cardiology

## 2017-02-17 ENCOUNTER — Ambulatory Visit: Payer: Medicare Other | Admitting: Physician Assistant

## 2017-02-17 NOTE — Telephone Encounter (Signed)
Returned call to patient's wife she stated husband had to cancel post hospital appointment today due to diarrhea.Appointment rescheduled with [name] PA 02/23/17 2:00 pm.

## 2017-02-17 NOTE — Telephone Encounter (Signed)
New Message  Pt wife call requesting to speak with RN. Pt had an appt today but pt is sick. Per pt wife pt is feeling nauseated and she would like to speak with RN . Please call back to discuss

## 2017-02-18 ENCOUNTER — Telehealth: Payer: Self-pay | Admitting: Cardiology

## 2017-02-18 NOTE — Telephone Encounter (Signed)
Pt of Dr. Ellyn Hack.  He is home from a recent hospitalization, and wife is concerned about his fatigue, in context of the listed medications.  She also feels like patient's BP is dropping too much and that he is more sleepy than usual - napping through most of the morning. He's been taking medications as prescribed, wife is concerned about the dosage and combination of carvedilol and amiodarone - notes pt was so sleepy earlier this week that she couldn't bring him to his appt - was rescheduled x2 and she verbalized she was afraid unless the problem of his increased sleepiness was resolved, he wouldn't come to the newly rescheduled appt.  States morning BPs are the ones that tend to run low. Shared several undated/untimed readings (she gave her best knowledge as to whether these were AM or PM readings:   137/78 HR 69 93/62 HR 64 AM 137/71 HR 72 PM 94/60 HR 66 AM 88/48 HR 67 AM 92/47 HR 68 AM 81/44 HR 67 AM 126/65 HR 49 PM 126/70 HR 49 126/66 HR 51 134/62 HR 89 89/54 HR 61 AM 122/72 HR 64 PM   AM cardizem, amiodarone, carvedilol PM amiodarone, carvedilol, xarelto, "PTSD med" (xanax) Takes morphine PRN (she notes he's cut back on this medication and takes 30 mins prior to PT, maybe 1 other time daily.)  Informed her I would seek review by Dr. Ellyn Hack and share any recommendations, but that we will likely need to address in OV. Recommended to keep upcoming appt. Wife verbalized understanding and thanks.

## 2017-02-18 NOTE — Telephone Encounter (Signed)
Please call,needs some advice about his Carvedilol ,Amiodarone and Cardizem.

## 2017-02-19 MED ORDER — CARVEDILOL 6.25 MG PO TABS: 3.1250 mg | ORAL_TABLET | Freq: Two times a day (BID) | ORAL | Status: DC

## 2017-02-19 NOTE — Telephone Encounter (Signed)
Recommendations to cut carvedilol to 3.125mg  bid relayed to patient's wife, patient also acknowledged instructions. They are aware to keep OV next Tuesday, and to call if new concerns in the meantime.

## 2017-02-19 NOTE — Telephone Encounter (Signed)
Since I have not seen him in ~4 months esp since the hospitalization, it is hard to know what is gong on.  Amio is new. - My recommendation would be cut Coreg in 1/2 until he sees [place] next week.  [name], MD

## 2017-02-23 ENCOUNTER — Ambulatory Visit: Payer: Medicare Other | Admitting: Physician Assistant

## 2017-02-25 ENCOUNTER — Encounter: Payer: Self-pay | Admitting: Cardiology

## 2017-03-01 ENCOUNTER — Telehealth: Payer: Self-pay | Admitting: Cardiology

## 2017-03-01 NOTE — Telephone Encounter (Signed)
Agree with Erasmo Downer

## 2017-03-01 NOTE — Telephone Encounter (Signed)
New message   Pt verbalized that he has been having nose bleeds   Please call pt

## 2017-03-01 NOTE — Telephone Encounter (Signed)
If nosebleeds persist he can use some afrin nasal spray for 1-2 days.  In the meantime, avoid blowing his nose, carefull with coughs/sneezes as this may cause scab to break and bleeding to re-start

## 2017-03-01 NOTE — Telephone Encounter (Signed)
Attempted to call patient x2. Line busy.

## 2017-03-01 NOTE — Telephone Encounter (Signed)
Returned call to patient of Dr. Ellyn Hack who is complaining of nosebleeds. He has had 2 episodes of nosebleeds since hospital discharge. He states these have resolved in about 1-4 minutes. Reassured patient given that nosebleeds did not persist. He states his nose was a traumatized with an NG tube while in the hospital. He is scheduled for an appointment w/PA on 10/10  He was recently discharged from the hospital (excerpt from discharge summary copied below).  Atrial fibrillation with RVR -On 9/3 this patient developed Afib with RVR which required transfer to stepdown.  Cardiology was consulted and followed the patient.  Rates were difficult to control and patient was placed on amiodarone infusion and eventually converted back to sinus rhythm.  CHA2DS2-VASc Score for Stroke Risk is 4, he was started on full dose lovenox while he was n.p.o., and this was transitioned to Xarelto on discharge.  He will need outpatient follow-up with cardiology in office. This has already been scheduled  Will forward to MD & pharmacy staff for review & recommendations

## 2017-03-02 NOTE — Telephone Encounter (Signed)
Pt notified he states that he will not need the Afrin at this time. He will continue with ice this seems to help. Pt states that noting else is needed, He will call back if anything else is needed.

## 2017-03-04 ENCOUNTER — Telehealth: Payer: Self-pay | Admitting: Cardiology

## 2017-03-04 NOTE — Telephone Encounter (Signed)
I haven't seen Halil since his heart catheterization. I understand he was in the hospital very sick and he did have atrial fibrillation. Unfortunately I don't know what is going on since then, and more than likely begin probably reduce some of his A. fib medicines for his since the carvedilol or diltiazem. But this is something that we need to discuss in clinic visit. Please keep the appointment with Mr. [name], [place] for next week. It is difficult to get in to be seen, keep the appointment. [name] is very good at explaining medications and is likely to ask me if there is anything that we can do differently. I will discuss it with him prior to the visit so he is aware of what I'm thinking.  Atrial fibrillation should be more pain in the neck than anything else for him. Once we get his medications adjusted, he should stabilize. My recommendation at this time probably be to stop the diltiazem until he is seen by me [name], PA.   [name], MD   ===View-only below this line===   ----- Message -----    From: [name]    Sent: 02/25/2017  2:43 PM EDT      To: [name], MD Subject: Visit Follow-Up Question  Update Arthur Holms.   Hey Dr. Ellyn Hack, 1/2 carvedilol with amiadrome, cartizem, breakfast and supper, with BP meds at lunch, nite symproic, Ativan, PRM meds, better response. more active, was exhausted when first home, but on regular nap schedule- better. Super drop in BP, 77/44, O2% 95-96, eg., Off med schedule, extreme pain, pushing to be more active, emotional punches etc. Brain just now waking up, and realizing how sick he has been, and low stamina. must build resilience he had. Though trying to resume brain functions for running our lives?problem solving day to day stressors, realizing he's slower for now, frustrates him. Been battling PTSD more. I tried to hold our world of everyday together and struggled with not-so-easy tasks that were a "snap" for him. I had to seek  resources to help me "draw water from a turnip" when he could not advise me. Feels bad about being away. Have a list of ???? next week. thought update from me might be helpful. Veatrice Kells

## 2017-03-10 ENCOUNTER — Ambulatory Visit: Payer: Medicare Other | Admitting: Physician Assistant

## 2017-03-11 ENCOUNTER — Ambulatory Visit: Payer: Medicare Other | Admitting: Physician Assistant

## 2017-03-11 NOTE — Telephone Encounter (Signed)
He rescheduled from today to 10/16, will reassess then

## 2017-03-15 ENCOUNTER — Telehealth: Payer: Self-pay | Admitting: Cardiology

## 2017-03-15 ENCOUNTER — Other Ambulatory Visit: Payer: Self-pay

## 2017-03-15 ENCOUNTER — Other Ambulatory Visit: Payer: Self-pay | Admitting: Cardiology

## 2017-03-15 MED ORDER — DILTIAZEM HCL ER COATED BEADS 180 MG PO CP24: 180.0000 mg | ORAL_CAPSULE | Freq: Every day | ORAL | 0 refills | Status: DC

## 2017-03-15 NOTE — Telephone Encounter (Signed)
New Message  Per pt states he need a refill on three of I=his prescriptions, but only remembers the name of one. Please call back to discuss   *STAT* If patient is at the pharmacy, call can be transferred to refill team.   1. Which medications need to be refilled? (please list name of each medication and dose if known) Xarelto 20mg    2. Which pharmacy/location (including street and city if local pharmacy) is medication to be sent to? [facility]  3. Do they need a 30 day or 90 day supply? [place]

## 2017-03-15 NOTE — Telephone Encounter (Signed)
REFILL 
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
## 2017-03-16 ENCOUNTER — Encounter: Payer: Self-pay | Admitting: Physician Assistant

## 2017-03-16 ENCOUNTER — Other Ambulatory Visit: Payer: Self-pay | Admitting: Physician Assistant

## 2017-03-16 ENCOUNTER — Ambulatory Visit (INDEPENDENT_AMBULATORY_CARE_PROVIDER_SITE_OTHER): Payer: Medicare Other | Admitting: Physician Assistant

## 2017-03-16 VITALS — BP 149/86 | HR 84 | Wt 218.0 lb

## 2017-03-16 DIAGNOSIS — I679 Cerebrovascular disease, unspecified: Secondary | ICD-10-CM | POA: Diagnosis not present

## 2017-03-16 DIAGNOSIS — E119 Type 2 diabetes mellitus without complications: Secondary | ICD-10-CM

## 2017-03-16 DIAGNOSIS — I6523 Occlusion and stenosis of bilateral carotid arteries: Secondary | ICD-10-CM | POA: Diagnosis not present

## 2017-03-16 DIAGNOSIS — I10 Essential (primary) hypertension: Secondary | ICD-10-CM

## 2017-03-16 DIAGNOSIS — I350 Nonrheumatic aortic (valve) stenosis: Secondary | ICD-10-CM | POA: Diagnosis not present

## 2017-03-16 DIAGNOSIS — I480 Paroxysmal atrial fibrillation: Secondary | ICD-10-CM | POA: Diagnosis not present

## 2017-03-16 DIAGNOSIS — E785 Hyperlipidemia, unspecified: Secondary | ICD-10-CM

## 2017-03-16 LAB — CBC
HEMATOCRIT: 36 % — AB (ref 37.5–51.0)
Hemoglobin: 12.1 g/dL — ABNORMAL LOW (ref 13.0–17.7)
MCH: 29.1 pg (ref 26.6–33.0)
MCHC: 33.6 g/dL (ref 31.5–35.7)
MCV: 87 fL (ref 79–97)
Platelets: 433 10*3/uL — ABNORMAL HIGH (ref 150–379)
RBC: 4.16 x10E6/uL (ref 4.14–5.80)
RDW: 14.7 % (ref 12.3–15.4)
WBC: 5.9 10*3/uL (ref 3.4–10.8)

## 2017-03-16 MED ORDER — AMIODARONE HCL 200 MG PO TABS: ORAL_TABLET | ORAL | 0 refills | Status: DC

## 2017-03-16 MED ORDER — FUROSEMIDE 20 MG PO TABS: 20.0000 mg | ORAL_TABLET | Freq: Every day | ORAL | 3 refills | Status: DC | PRN

## 2017-03-16 NOTE — Patient Instructions (Signed)
Medication Instructions:   DECREASE AMIODARONE TO 200 MG ONCE DAILY X ONE MONTH AND THEN STOP= 1 TABLET ONCE DAILY X ONE MONTH AND THEN STOP  TAKE FUROSEMIDE AS NEEDED FOR SWELLING OR SHORTNESS OF BREATH  Labwork:  Your physician recommends that you HAVE LAB WORK TODAY  Testing/Procedures:  Your physician has requested that you have a carotid duplex. This test is an ultrasound of the carotid arteries in your neck. It looks at blood flow through these arteries that supply the brain with blood. Allow one hour for this exam. There are no restrictions or special instructions.IN 3 MONTHS   Follow-Up:  Your physician recommends that you schedule a follow-up appointment in: [place]  Your physician recommends that you schedule a follow-up appointment in: 3-4 MONTHS WITH DR [facility]

## 2017-03-16 NOTE — Telephone Encounter (Signed)
Refill Request.  

## 2017-03-17 NOTE — Progress Notes (Signed)
Hemoglobin increasing, almost normal range. Platelet has been high.

## 2017-04-08 ENCOUNTER — Other Ambulatory Visit: Payer: Self-pay | Admitting: *Deleted

## 2017-04-08 MED ORDER — RIVAROXABAN 20 MG PO TABS: 20.0000 mg | ORAL_TABLET | Freq: Every day | ORAL | 3 refills | Status: DC

## 2017-04-08 MED ORDER — CARVEDILOL 6.25 MG PO TABS: 3.1250 mg | ORAL_TABLET | Freq: Two times a day (BID) | ORAL | 3 refills | Status: DC

## 2017-04-12 ENCOUNTER — Other Ambulatory Visit: Payer: Self-pay | Admitting: Cardiology

## 2017-04-12 NOTE — Telephone Encounter (Signed)
Rx has been sent to the pharmacy electronically. ° °

## 2017-04-14 ENCOUNTER — Encounter: Payer: Self-pay | Admitting: Physician Assistant

## 2017-04-14 ENCOUNTER — Ambulatory Visit: Payer: Medicare Other | Admitting: Physician Assistant

## 2017-04-14 VITALS — BP 152/90 | HR 68 | Ht 73.0 in | Wt 220.0 lb

## 2017-04-14 DIAGNOSIS — I480 Paroxysmal atrial fibrillation: Secondary | ICD-10-CM | POA: Diagnosis not present

## 2017-04-14 DIAGNOSIS — E119 Type 2 diabetes mellitus without complications: Secondary | ICD-10-CM | POA: Diagnosis not present

## 2017-04-14 DIAGNOSIS — E785 Hyperlipidemia, unspecified: Secondary | ICD-10-CM

## 2017-04-14 DIAGNOSIS — I10 Essential (primary) hypertension: Secondary | ICD-10-CM | POA: Diagnosis not present

## 2017-04-14 MED ORDER — CARVEDILOL 12.5 MG PO TABS: 12.5000 mg | ORAL_TABLET | Freq: Two times a day (BID) | ORAL | 3 refills | Status: DC

## 2017-04-14 MED ORDER — RIVAROXABAN 20 MG PO TABS: 20.0000 mg | ORAL_TABLET | Freq: Every day | ORAL | 3 refills | Status: DC

## 2017-04-14 NOTE — Patient Instructions (Signed)
Medication Instructions:  INCREASE Coreg 12.5 mg take 1 tablet twice a day  Labwork: None   Testing/Procedures: None   Follow-Up: Keep upcoming appt with Dr Ellyn Hack   Any Other Special Instructions Will Be Listed Below (If Applicable).  If you need a refill on your cardiac medications before your next appointment, please call your pharmacy.
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
## 2017-04-16 ENCOUNTER — Encounter: Payer: Self-pay | Admitting: Physician Assistant

## 2017-04-29 ENCOUNTER — Telehealth: Payer: Self-pay | Admitting: Physician Assistant

## 2017-04-29 NOTE — Telephone Encounter (Signed)
New message  Patient states carvedilol (COREG) 12.5 MG tablet may need adjustment. Please call  Pt c/o BP issue: STAT if pt c/o blurred vision, one-sided weakness or slurred speech  1. What are your last 5 BP readings? 176/90, 171/86, 169/90, 155/84, 168/92 2. Are you having any other symptoms (ex. Dizziness, headache, blurred vision, passed out)? headache 3. What is your BP issue? Thinks medication needs adjustment   Pt c/o of Chest Pain: STAT if CP now or developed within 24 hours  1. Are you having CP right now? NO  2. Are you experiencing any other symptoms (ex. SOB, nausea, vomiting, sweating)? NO  3. How long have you been experiencing CP? 2 days agio  4. Is your CP continuous or coming and going? Coming and going  5. Have you taken Nitroglycerin? YES ?

## 2017-04-29 NOTE — Telephone Encounter (Signed)
Returned call to pt he states that his BP has been having high BP since increasing his medication on his visit last week with Isaac Laud. It has been running176/90, 171/86, 169/90, 155/84, 168/92 HR has been in the 60's. Pt states that he has had 3 episodes of chest pain but this has been relieved with nitro x1. Pt states he has had increased stress lately, and this may be another reason why is BP is elevated. Pt also states that he has had increased swelling but he increased his  X2 and this has resolved also. Pt states that he will continue taking BP and call back if it has not went down in a timely manner. Will send to PA for further advise

## 2017-04-30 NOTE — Telephone Encounter (Signed)
continue to observe, if SBP remain > 150 after a week, may increase coreg to 25mg  BID. If has leg swelling, recommend leg elevation. If still has intermittent chest pain, will consider addition of Imdur, however previous cath in May 2018 showed normal coronary arteries, suspicion for significant blockage causing his symptom very low.

## 2017-04-30 NOTE — Telephone Encounter (Signed)
Relayed message to pt he states that he will continue BP reading as directed. He will call back if he has any CP and if BP is too high and in a week if he increases the coreg or not then ~05-11-17. He states that his leg swelling is down today he states that he is already elevating his legs and resting frequently and wearing compression stockings. He will go to the ER if CP worsens or persists

## 2017-05-05 NOTE — Telephone Encounter (Signed)
Thank you :)

## 2017-06-22 ENCOUNTER — Inpatient Hospital Stay (HOSPITAL_COMMUNITY): Admission: RE | Admit: 2017-06-22 | Payer: Medicare Other | Source: Ambulatory Visit

## 2017-06-23 ENCOUNTER — Ambulatory Visit (HOSPITAL_COMMUNITY): Payer: Medicare Other

## 2017-07-01 ENCOUNTER — Ambulatory Visit (HOSPITAL_COMMUNITY): Payer: Medicare Other

## 2017-07-05 ENCOUNTER — Ambulatory Visit: Payer: Medicare Other | Admitting: Cardiology

## 2017-07-05 ENCOUNTER — Ambulatory Visit (HOSPITAL_COMMUNITY)
Admission: RE | Admit: 2017-07-05 | Discharge: 2017-07-05 | Disposition: A | Discharge status: Home / Self Care | Payer: Medicare Other | Source: Ambulatory Visit | Attending: Cardiology | Admitting: Cardiology

## 2017-07-05 ENCOUNTER — Encounter: Payer: Self-pay | Admitting: Cardiology

## 2017-07-05 VITALS — BP 182/77 | HR 73 | Ht 73.0 in | Wt 228.4 lb

## 2017-07-05 DIAGNOSIS — I679 Cerebrovascular disease, unspecified: Secondary | ICD-10-CM

## 2017-07-05 DIAGNOSIS — E785 Hyperlipidemia, unspecified: Secondary | ICD-10-CM

## 2017-07-05 DIAGNOSIS — I200 Unstable angina: Secondary | ICD-10-CM | POA: Diagnosis not present

## 2017-07-05 DIAGNOSIS — I4891 Unspecified atrial fibrillation: Secondary | ICD-10-CM | POA: Diagnosis not present

## 2017-07-05 DIAGNOSIS — I10 Essential (primary) hypertension: Secondary | ICD-10-CM

## 2017-07-05 DIAGNOSIS — I6523 Occlusion and stenosis of bilateral carotid arteries: Secondary | ICD-10-CM | POA: Insufficient documentation

## 2017-07-05 DIAGNOSIS — I9789 Other postprocedural complications and disorders of the circulatory system, not elsewhere classified: Secondary | ICD-10-CM

## 2017-07-05 DIAGNOSIS — R600 Localized edema: Secondary | ICD-10-CM

## 2017-07-05 DIAGNOSIS — I350 Nonrheumatic aortic (valve) stenosis: Secondary | ICD-10-CM | POA: Diagnosis not present

## 2017-07-05 MED ORDER — CARVEDILOL 12.5 MG PO TABS: 12.5000 mg | ORAL_TABLET | Freq: Two times a day (BID) | ORAL | 3 refills | Status: DC

## 2017-07-05 MED ORDER — IRBESARTAN 75 MG PO TABS: 75.0000 mg | ORAL_TABLET | Freq: Every day | ORAL | 3 refills | Status: DC
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
## 2017-07-05 NOTE — Patient Instructions (Signed)
Medications instructions   increase to Carvedilol 12.5 mg twice a day  Start IRBESARTAN 75 MG ONE TABLET DAILY    Your physician recommends that you schedule a follow-up appointment in [place] ,[place] has requested that you have an echocardiogram. Echocardiography is a painless test that uses sound waves to create images of your heart. It provides your doctor with information about the size and shape of your heart and how well your heart's chambers and valves are working. This procedure takes approximately one hour. There are no restrictions for this procedure.   Your physician wants you to follow-up in SEPT 2019 [place].You will receive a reminder letter in the mail two months in advance. If you don't receive a letter, please call our office to schedule the follow-up appointment.   If you need a refill on your cardiac medications before your next appointment, please call your pharmacy.

## 2017-07-07 ENCOUNTER — Encounter: Payer: Self-pay | Admitting: Cardiology

## 2017-07-07 DIAGNOSIS — I4819 Other persistent atrial fibrillation: Secondary | ICD-10-CM | POA: Insufficient documentation

## 2017-07-07 DIAGNOSIS — I9789 Other postprocedural complications and disorders of the circulatory system, not elsewhere classified: Secondary | ICD-10-CM | POA: Insufficient documentation

## 2017-07-07 DIAGNOSIS — I4891 Unspecified atrial fibrillation: Secondary | ICD-10-CM | POA: Insufficient documentation

## 2017-07-07 NOTE — Assessment & Plan Note (Signed)
He is not on any anticholesterol medications.  Last LDL was 109.  Not quite at goal.  I am quite reluctant in trying statins on him at this point, however he should be due for follow-up labs after close follow-up visit.  If still not at goal at that time, would probably start low-dose Crestor.

## 2017-07-07 NOTE — Assessment & Plan Note (Signed)
He had postop A. fib in the setting of knee surgery.  He has multiple risk factors, with history of stroke, hypertension and intermittent diabetes.  He has not had any symptoms of recurrence, however I still think he probably Has a High Likelihood of Recurrence.  In That Regard, I Think Is Probably Reasonable to Consider Him on Xarelto.  This patients CHA2DS2-VASc Score and unadjusted Ischemic Stroke Rate (% per year) is equal to 9.7 % stroke rate/year from a score of 6  Above score calculated as 1 point each if present [CHF, HTN, DM, Vascular=MI/PAD/Aortic Plaque, Age if 65-74, or Male]; 2 points each if present [Age > 75, or Stroke/TIA/TE]

## 2017-07-07 NOTE — Assessment & Plan Note (Signed)
Very labile blood pressure.  Now back high again.  He was supposed of had his carvedilol increased to 12.5 mg twice daily which never happened.  I will go ahead and do that for additional blood pressure control as well as potential protection for A. fib rate control.  He is also not on his ARB -I will restart low-dose irbesartan

## 2017-07-07 NOTE — Assessment & Plan Note (Signed)
Thought to have had progressive anginal symptoms last year.  He finally underwent cardiac catheterization after long wait was found to have minimal CAD. I suspect he could have some microvascular ischemia.  With the negative cath would go along with his prior negative Myoview.  I therefore am reluctant to do any ischemic evaluation for his intermittent chest discomfort.  Would have a low threshold however for considering Imdur if he is still using nitroglycerin off and on. --This would be consideration for close follow-up.

## 2017-07-07 NOTE — Assessment & Plan Note (Signed)
Interesting progression of disease from mild to moderate in 1 year and before that from sclerosis to stenosis.  This is all happened since that been caring for him.  I will check another echo this year.  If it continues to progress, may need to check more frequently. I suspect that his systemic inflammatory conditions are related to Agent Orange Exposure may very well be partly responsible for his progression of disease.  Continue to monitor for symptoms, however his symptoms be very difficult to tease out in the midst his other symptoms.  Thankfully no syncopal symptoms or heart failure symptoms at this point.

## 2017-07-07 NOTE — Assessment & Plan Note (Signed)
Carotid Dopplers done today showed stable findings from 2017. Continue follow-up annually. Continue risk factor modification with blood pressure and lipid management.

## 2017-07-09 ENCOUNTER — Telehealth: Payer: Self-pay

## 2017-07-09 NOTE — Telephone Encounter (Signed)
Called patient to discuss test results, stated he has not ever seen [name] and does not know why I'm calling him about a test that Dr Ellyn Hack has ordered because he just recently seen Dr Ellyn Hack. Asked my job title and name; then said he would prefer a nurse call him back not a CMA. He wants to speak to someone qualified and he doesn't know what I'm reading because I do not know what I'm talking about.

## 2017-07-09 NOTE — Telephone Encounter (Signed)
SPOKE TO PATIENT , RESULT GIVEN VOICED UNDERSTANDING.

## 2017-07-09 NOTE — Telephone Encounter (Signed)
-----   Message from [place], [place] sent at 07/06/2017  3:07 PM EST ----- Moderate disease in right internal carotid artery. Mild disease on the left.

## 2017-07-15 ENCOUNTER — Ambulatory Visit: Payer: Medicare Other | Admitting: Internal Medicine

## 2017-07-24 ENCOUNTER — Other Ambulatory Visit: Payer: Self-pay | Admitting: Internal Medicine

## 2017-07-24 DIAGNOSIS — J441 Chronic obstructive pulmonary disease with (acute) exacerbation: Secondary | ICD-10-CM

## 2017-08-03 ENCOUNTER — Ambulatory Visit: Payer: Medicare Other | Admitting: Physician Assistant

## 2017-08-04 ENCOUNTER — Telehealth: Payer: Self-pay | Admitting: Cardiology

## 2017-08-04 NOTE — Telephone Encounter (Signed)
New Message:    Pt said he is going to have teeth extracted on this Friday. He needs to know about stopping his Xarelto. The [facility] told him to contact his Cardiologist about stopping his Xarelto.

## 2017-08-04 NOTE — Telephone Encounter (Signed)
Returned call to patient, patient states he is having multiple teeth extracted on Friday at the [facility] and needs instructions on his Xarelto.   States the Dental Clinic does not fax over clearance request, but he needs to know if he needs to start holding this tonight.   They will be extracting multiple teeth (2-3).      Advised I would route to pre-op pool for review.

## 2017-08-05 NOTE — Telephone Encounter (Signed)
   Primary Cardiologist: No primary care provider on file.  Chart reviewed as part of pre-operative protocol coverage. Given past medical history and time since last visit, based on ACC/AHA guidelines, [name] would be at acceptable risk for the planned procedure without further cardiovascular testing.   I will route this recommendation to the requesting party via Epic fax function and remove from pre-op pool.  Please call with questions.  Per clinical pharmacist: Pt takes Xarelto for afib with CHADS2VASc score of 6 (age, HTN, CAD, DM, stroke). Ideally would prefer for pt to continue Xarelto if possible due to history of stroke and low bleed risk of procedure. If absolutely necessary to hold, only hold Xarelto for 1 day and resume ASAP after.  [place], [place] 08/05/2017, 4:53 PM

## 2017-08-05 NOTE — Telephone Encounter (Signed)
Pt takes Xarelto for afib with CHADS2VASc score of 6 (age, HTN, CAD, DM, stroke). Ideally would prefer for pt to continue Xarelto if possible due to history of stroke and low bleed risk of procedure. If absolutely necessary to hold, only hold Xarelto for 1 day and resume ASAP after.

## 2017-08-05 NOTE — Telephone Encounter (Signed)
Patient is well known to me, cath in 09/2016 was normal. H/o post op afib, seen by Dr. Ellyn Hack, felt the patient has high probability of recurrent afib and kept him on Xarelto. Clinical pharmacist to advise on Xarelto.

## 2017-08-09 NOTE — Telephone Encounter (Signed)
Attempted to call patients home number several times and continued tp get a busy signal. Left detailed message on patients cell explaining that per stroke risk would prefer patient not hold xarelto at all but if necessary patient may hold the day before procedure and resume immediately following procedure. Instructed patient to call back with any questions.

## 2017-08-12 ENCOUNTER — Telehealth: Payer: Self-pay | Admitting: Internal Medicine

## 2017-08-12 ENCOUNTER — Ambulatory Visit: Payer: Medicare Other | Admitting: Internal Medicine

## 2017-08-12 NOTE — Telephone Encounter (Signed)
Spoke with patient. He had an appt with CY this afternoon at 2pm but had to cancel due to having cellulitis in his left foot. He was advised by his PCP to take it easy today to let his foot rest.   He has been coughing for the past 2-3 days. Productive cough with yellow phlegm. Had a fever yesterday of 100F but isn't sure if it was related to the cellulitis. Denied any new body aches. Nasal congestion with green discharge. He has been using his Symbicort 160 and albuterol neb treatments with no relief.   Wishes to use [facility] in [place].   CY, please advise. Thanks!

## 2017-08-12 NOTE — Telephone Encounter (Signed)
Spoke with CY, he wanted to know if the patient was currently on any abx for the cellulitis.   Spoke with patient again. He stated that he has on hand amoxicillin 500mg  that he is supposed to be using prior to his dental appointments. He is taking the amox 500mg  every 4 hours.   He wants to know if the amoxicillin will also help with his current respiratory issues or will he need something to help get rid of the cough.

## 2017-08-12 NOTE — Telephone Encounter (Signed)
Spoke with pt. He is aware of Dr. Janee Morn recommendation. States that he already has Tessalon and will take as Dr. Annamaria Boots recommended. Nothing further was needed.

## 2017-08-12 NOTE — Telephone Encounter (Signed)
The amoxacillin may help cellulitis and bronchitis.  Ok to offer benzonatate 20 perles for cough, # 40,  1 every 8 hours for cough

## 2017-08-18 ENCOUNTER — Telehealth: Payer: Self-pay | Admitting: Internal Medicine

## 2017-08-18 ENCOUNTER — Ambulatory Visit: Payer: Medicare Other | Admitting: Physician Assistant

## 2017-08-18 MED ORDER — OSELTAMIVIR PHOSPHATE 75 MG PO CAPS: 75.0000 mg | ORAL_CAPSULE | Freq: Two times a day (BID) | ORAL | 0 refills | Status: DC

## 2017-08-18 NOTE — Telephone Encounter (Signed)
Called and spoke with pt spouse, Olin Hauser. Pt is currently on amox 500mg  & tessalon q6h . Pt will complete abx tonight. Pt developed flu like symptoms last night temp of 101, body aches, prod cough with yellow mucus & wheezing. Using ventolin bid with mild relief .  Pt did an albuterol neb once last night with mild relief.  Referred pharmacy is [facility] on [address]   CY please advise. Thanks  Current Outpatient Medications on File Prior to Visit  Medication Sig Dispense Refill  . ALPRAZolam (XANAX) 1 MG tablet Take 1 tablet (1 mg total) by mouth 3 (three) times daily as needed for anxiety or sleep. (Patient taking differently: Take 1-2 mg by mouth every 8 (eight) hours as needed (for anxiety/PTSD). ) 90 tablet 0  . beclomethasone (BECONASE-AQ) 42 MCG/SPRAY nasal spray Place 1 spray into both nostrils daily. Dose is for each nostril.    . budesonide-formoterol (SYMBICORT) 160-4.5 MCG/ACT inhaler INHALE 2 PUFFS BY MOUTH TWICE DAILY THEN RINSE MOUTH WELL 30.6 g 0  . carvedilol (COREG) 12.5 MG tablet Take 1 tablet (12.5 mg total) by mouth 2 (two) times daily with a meal. 180 tablet 3  . fluticasone (FLONASE) 50 MCG/ACT nasal spray Place 1 spray into both nostrils daily.    . furosemide (LASIX) 20 MG tablet Take 1 tablet (20 mg total) by mouth daily as needed. MAY TAKE ADDITIONAL TABLET AS NEEDED SWELLING OR SHORTNESS OF BREATH 180 tablet 3  . irbesartan (AVAPRO) 75 MG tablet Take 1 tablet (75 mg total) by mouth daily. 90 tablet 3  . Naldemedine Tosylate (SYMPROIC) 0.2 MG TABS Take 0.2 mg by mouth daily at 12 noon.     . nitroGLYCERIN (NITROSTAT) 0.4 MG SL tablet Place 1 tablet (0.4 mg total) under the tongue every 5 (five) minutes as needed for chest pain. 25 tablet 2  . polyethylene glycol (MIRALAX / GLYCOLAX) packet Take 17 g by mouth 2 (two) times daily as needed for moderate constipation.     . promethazine (PHENERGAN) 25 MG tablet Take 1 tablet (25 mg total) by mouth every 6 (six) hours as  needed. FOR NAUSEA (Patient taking differently: Take 25 mg by mouth every 6 (six) hours as needed for nausea or vomiting. FOR NAUSEA) 90 tablet 0  . Respiratory Therapy Supplies (FLUTTER) DEVI Use as directed 1 each 0  . rivaroxaban (XARELTO) 20 MG TABS tablet Take 1 tablet (20 mg total) daily with supper by mouth. 90 tablet 3   No current facility-administered medications on file prior to visit.     Allergies  Allergen Reactions  . Bee Venom Anaphylaxis  . Codeine Shortness Of Breath and Rash  . Doxycycline Shortness Of Breath and Rash  . Ivp Dye [Iodinated Diagnostic Agents] Shortness Of Breath    Lost consciousness/difficulty breathing Omni - Paque Contrast   . Levaquin [Levofloxacin] Hives and Shortness Of Breath  . Oxycodone Hcl Shortness Of Breath, Swelling and Rash  . Stadol [Butorphanol] Shortness Of Breath  . Succinylcholine Chloride Shortness Of Breath, Nausea Only, Rash and Other (See Comments)    Difficulty breathing   . Atorvastatin     myalgias  . Bystolic [Nebivolol Hcl] Other (See Comments)    Nightmares, flashbacks (PTSD)  . Iohexol      Desc: hives,neck and torso erythemia   . Methocarbamol Nausea Only    rash  . Pentazocine Lactate     rash  . Dilaudid [Hydromorphone Hcl] Nausea Only and Rash    Aggressive   .  Synvisc [Hylan G-F 20] Hives and Rash

## 2017-08-18 NOTE — Telephone Encounter (Signed)
Per CY verbally- suggest Tamiflu 75mg  #10 one tab bid for pt and his wife.  lmtcb x1 for pt.

## 2017-08-18 NOTE — Telephone Encounter (Signed)
Pt's spouse, Olin Hauser is aware of CY's recommendations and voiced her understanding. Rx for Tamiflu has been sent to preferred pharmacy for pt and pt's spouse, Olin Hauser.  Nothing further is needed.

## 2017-08-18 NOTE — Telephone Encounter (Signed)
LMTCB

## 2017-08-18 NOTE — Telephone Encounter (Signed)
Patient wife [name] called she can be reached at [phone]

## 2017-08-18 NOTE — Telephone Encounter (Signed)
Pt is calling back [phone] or [phone] pt had fever all night

## 2017-08-18 NOTE — Telephone Encounter (Signed)
Pt wife calling back, pt having flu like symptoms and wants to know if Dr. Annamaria Boots will call him in something or wants to see him CB [phone] [facility] in [place] [address]

## 2017-08-19 ENCOUNTER — Ambulatory Visit: Payer: Medicare Other | Admitting: Physician Assistant

## 2017-09-02 ENCOUNTER — Ambulatory Visit: Payer: Medicare Other | Admitting: Physician Assistant

## 2017-09-21 ENCOUNTER — Ambulatory Visit: Payer: Medicare Other | Admitting: Internal Medicine

## 2017-09-23 ENCOUNTER — Ambulatory Visit: Payer: Medicare Other | Admitting: Internal Medicine

## 2017-09-23 ENCOUNTER — Ambulatory Visit (INDEPENDENT_AMBULATORY_CARE_PROVIDER_SITE_OTHER)
Admission: RE | Admit: 2017-09-23 | Discharge: 2017-09-23 | Disposition: A | Discharge status: Home / Self Care | Payer: Medicare Other | Source: Ambulatory Visit | Attending: Internal Medicine | Admitting: Internal Medicine

## 2017-09-23 ENCOUNTER — Encounter: Payer: Self-pay | Admitting: Internal Medicine

## 2017-09-23 ENCOUNTER — Ambulatory Visit: Payer: Medicare Other | Admitting: Physician Assistant

## 2017-09-23 VITALS — BP 142/80 | HR 64 | Ht 73.0 in | Wt 234.6 lb

## 2017-09-23 DIAGNOSIS — J441 Chronic obstructive pulmonary disease with (acute) exacerbation: Secondary | ICD-10-CM

## 2017-09-23 DIAGNOSIS — F4310 Post-traumatic stress disorder, unspecified: Secondary | ICD-10-CM | POA: Diagnosis not present

## 2017-09-23 MED ORDER — AMOXICILLIN-POT CLAVULANATE 875-125 MG PO TABS: 1.0000 | ORAL_TABLET | Freq: Two times a day (BID) | ORAL | 0 refills | Status: DC

## 2017-09-23 NOTE — Assessment & Plan Note (Signed)
Acute exacerbation.  Last antibiotic was during treatment for flulike illness 3 or 4 weeks ago. Plan-Augmentin.  He will use his nebulizer at home as needed.  CXR.

## 2017-09-23 NOTE — Patient Instructions (Addendum)
Order- CXR    Dx COPD exacerbation  Script sent for augmentin  Try using otc nasal saline gel   As needed to help soothe lining of your nose to reduce nose bleeds

## 2017-09-23 NOTE — Progress Notes (Signed)
HPI M Veteran, Never smoker followed for Bronchiectasis/recurrent pneumonia/Enterobacter due to reflux, complicated by DM, HBP, lung nodule, GERD, ischemic colitis/ multiple surgeries, valvular heart disease Office Spirometry 01/16/16-moderate restriction of exhaled volume, at least mild obstructive airways disease. FVC 3.02/59%, FEV1 2.13/56%, ratio 0.95, FEF 25-75% 1.28/42%  ---------------------------------------------------------  01/08/17- 67 year old male [name], never smoker, followed for Bronchiectasis/recurrent pneumonia/Enterobacter/recurrent aspiration, complicated by DM, HBP, lung nodule, GERD, ischemic colitis/multiple surgeries, valvular heart disease, ASCVD SOB has become worse. Pt been having pain due to having dental work done and stated that he had to take 2 nitroglycerin last night. Pt still coughing with occ. Mucus. Near baseline status. Some dyspnea with exertion but activity is limited by knee pain, now pending left TKR August 27. Variable cough. Usually nonproductive or clear sputum. Pending completion dental extractions but waiting until after his knee surgery. No acute respiratory events recently. Denies fever or purulent sputum. Echocardiogram had shown EF 60-65 percent, mild AS, AR, grade 1 diastolic dysfunction CXR 4/0/98 No active cardiopulmonary disease. Office Spirometry 01/16/16-moderate restriction of exhaled volume, at least mild obstructive airways disease. FVC 3.02/59%, FEV1 2.13/56%, ratio 0.95, FEF 25-75% 1.28/42%  09/23/2017- 67 year old male [name], never smoker, followed for Bronchiectasis/ recurrent pneumonia/Enterobacter/recurrent aspiration, complicated by DM, HBP, lung nodule, GERD, ischemic colitis/multiple surgeries, valvular heart disease, ASCVD, AFib ----COPD- breathing worse, SOB with all exertion, wheezing, productive cough yellowish -greenish, took nebulizer this morning and rescue inhaler 30 minutes prior to visit Symbicort 160, flutter device,  nebulizer Describes 1 week of increased cough dyspnea without fever.  More frequent epistaxis on Xarelto after A. fib complicated left TKR last fall.  Tussive parasternal pains.  Cardiology is following for his valvular murmur.  Left ankle edema occasionally when his obstipation is worse. CXR 02/07/2017- IMPRESSION: Low lung volumes with basilar atelectasis.  ROS-   += pos Constitutional:   No-   weight loss, night sweats, +fevers, chills, fatigue, lassitude. HEENT:   No-  headaches, difficulty swallowing, +tooth/dental problems, sore throat,        sneezing, itching, ear ache, nasal congestion, post nasal drip,  CV:      chest pain, +orthopnea, PND, swelling in lower extremities, anasarca, dizziness, +palpitations Resp: + shortness of breath with exertion or at rest.                +productive cough,  No non-productive cough,              +change in color of mucus.  No- wheezing.   Skin: No-   rash or lesions. GI:  No-   heartburn, indigestion, +abdominal pain, nausea, vomiting,  GU:  MS:  +  joint pain or swelling.  Neuro-     nothing unusual Psych:  No- change in mood or affect. + depression or anxiety.  No memory loss  OBJ- Physical Exam    General- Alert, Oriented, + anxious/depressed, Distress- none acute, + overweight Skin- + fungal nail changes right hand and also reported on toes of both feet Lymphadenopathy- none Head- atraumatic            Eyes- Gross vision intact, PERRLA, conjunctivae and secretions clear            Ears- Hearing, canals-normal            Nose-  no-Septal dev,polyps, erosion, perforation             Throat- Mallampati II , mucosa clear , drainage- none, tonsils- atrophic. + Missing teeth,  Neck- flexible , trachea midline,  no stridor , thyroid nl, carotid no bruit Chest - symmetrical excursion , unlabored           Heart/CV- RRR ,  murmur + 2/6 S aortic , no gallop  , no rub, nl s1 s2                           - JVD- none , edema- none, stasis changes-  none, varices- none           Lung-+ bilateral wheeze, unlabored, cough-none, wheeze -none                  dullness-none, rub- none           Chest wall-  Abd-  Br/ Gen/ Rectal- Not done, not indicated Extrem-  +cane,  Neuro- grossly intact to observation
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
## 2017-10-11 ENCOUNTER — Telehealth: Payer: Self-pay | Admitting: Internal Medicine

## 2017-10-11 DIAGNOSIS — R040 Epistaxis: Secondary | ICD-10-CM

## 2017-10-11 DIAGNOSIS — H9209 Otalgia, unspecified ear: Secondary | ICD-10-CM

## 2017-10-11 NOTE — Telephone Encounter (Signed)
Pt aware of referral.  Nothing further needed.

## 2017-10-11 NOTE — Telephone Encounter (Signed)
atc pt, line was answered but nobody spoke on the receiving end.  Called back X2 and received a fast busy signal.  wcb.

## 2017-10-11 NOTE — Telephone Encounter (Signed)
Pt called back, requesting a referral to an ENT.  Per last OV note it appears that nose bleeds were discussed, and this is why he is requesting the referral, as well as ear pain.   CY please advise on referral.  Thanks.

## 2017-10-11 NOTE — Telephone Encounter (Signed)
Referral to [facility] for epistaxis and earpain

## 2017-10-19 ENCOUNTER — Telehealth: Payer: Self-pay | Admitting: *Deleted

## 2017-10-19 NOTE — Telephone Encounter (Signed)
   [facility] Pre-operative Risk Assessment    Request for surgical clearance:  1. What type of surgery is being performed? Dental extractions   2. When is this surgery scheduled? TBD   3. What type of clearance is required (medical clearance vs. Pharmacy clearance to hold med vs. Both)?  both  4. Are there any medications that need to be held prior to surgery and how long? Xarelto   5. Practice name and name of physician performing surgery? [facility] Dr. Kendrick Fries   6. What is your office phone number [phone]    7.   What is your office fax number [phone]  8.   Anesthesia type (None, local, MAC, general) ?    [name] [name] 10/19/2017, 3:24 PM  _________________________________________________________________   (provider comments below)

## 2017-10-20 NOTE — Telephone Encounter (Signed)
Please clarify how many teeth patient is having extracted. He will not need to hold his Xarelto if he is having 1-2 teeth extracted, however will need to hold anticoagulation if > 2 teeth are pulled.

## 2017-10-21 NOTE — Telephone Encounter (Signed)
Spoke with [facility] and confirmed that pt will have a total of 5 teeth extracted.

## 2017-10-21 NOTE — Telephone Encounter (Signed)
Patient with diagnosis of atrial fibrillation on Xarelto for anticoagulation.    Procedure: dental extractions (5) Date of procedure: TBD  CHADS2-VASc score of  6 (, HTN, AGE, DM2, stroke/tia x 2, CAD, )  CrCl 151 (114 using IBW) Platelet count 433  Per office protocol, patient can hold Xarelto for 1 days prior to procedure.  Cannot recommend longer due to history of stroke.   Patient should restart Xarelto on the evening of procedure or day after, at discretion of procedure MD

## 2017-10-22 NOTE — Telephone Encounter (Signed)
Pt is to see [name] on 10/26/17, please have pt keep appt and [name] will send in clearance.  He will need to hold Xarelto for 24 hours prior to teeth extraction.

## 2017-10-26 ENCOUNTER — Ambulatory Visit: Payer: Medicare Other | Admitting: Physician Assistant

## 2017-10-26 ENCOUNTER — Telehealth: Payer: Self-pay | Admitting: Cardiology

## 2017-10-26 ENCOUNTER — Telehealth: Payer: Self-pay | Admitting: Internal Medicine

## 2017-10-26 MED ORDER — AZITHROMYCIN 250 MG PO TABS: ORAL_TABLET | ORAL | 1 refills | Status: DC

## 2017-10-26 NOTE — Telephone Encounter (Signed)
Spoke with pt's wife [name] (dpr on file), c/o deep prod cough with yellow/green mucus, worsening fatigue, chest soreness from coughing.  Pt denies fever.   Pt using albuterol neb frequently, mucinex, augmentin (prescribed at 4/25 visit for PRN use) has 1 day left of augmentin.  Requesting further recs.   Wife is requesting additional recs.   Pharmacy: [facility] in [place].    CY please advise.  Thanks!

## 2017-10-26 NOTE — Telephone Encounter (Signed)
Pt's wife calling    Wife wish to speak to nurse concerning pt's care and how he is doing.

## 2017-10-26 NOTE — Telephone Encounter (Signed)
See telephone encounter from 10-26-17.

## 2017-10-26 NOTE — Telephone Encounter (Signed)
Offer Zpak 250 mg, # 6 2 today then 1 daily,   Ref x 1

## 2017-10-26 NOTE — Telephone Encounter (Signed)
Spoke with patients wife and she stated patient currently has URI so preop appointment cancelled for today. She will likely postpone teeth extraction so rescheduled appointment with Dr Ellyn Hack. Wife aware of date and time.

## 2017-10-26 NOTE — Telephone Encounter (Signed)
lmtcb for pt/wife.  Will send in rx after speaking with pt.

## 2017-10-26 NOTE — Telephone Encounter (Signed)
Pt's wife called back, aware of recs.  Rx sent to preferred pharmacy.  Nothing further needed.

## 2017-10-26 NOTE — Telephone Encounter (Signed)
New Message:       Pt is calling and states that Dr. Annamaria Boots has gave the pt and antibiotic to help with his gum infection and pt is to take all of this medication until it is all gone. Pt also states they have rescheduled his procedure to 11/16/17. Pt still has some questions they would like to ask but wanted to inform us of this information.

## 2017-11-01 ENCOUNTER — Telehealth: Payer: Self-pay | Admitting: Internal Medicine

## 2017-11-01 NOTE — Telephone Encounter (Signed)
Called and spoke to patient. Patient reports that he continues to have a sinus infection. Patient reports sinus congestion and pain along with a productive cough with yellow phlegm. Patient stated he finished a zpak yesterday and feels like he isn't better. Scheduled him to see Wyn Quaker, NP at 1530 on 6/4. Nothing further is needed at this time.

## 2017-11-02 ENCOUNTER — Ambulatory Visit: Payer: Medicare Other | Admitting: Pulmonary Disease

## 2017-11-02 ENCOUNTER — Encounter: Payer: Self-pay | Admitting: Pulmonary Disease

## 2017-11-02 DIAGNOSIS — J309 Allergic rhinitis, unspecified: Secondary | ICD-10-CM | POA: Diagnosis not present

## 2017-11-02 DIAGNOSIS — J320 Chronic maxillary sinusitis: Secondary | ICD-10-CM | POA: Diagnosis not present

## 2017-11-02 DIAGNOSIS — J449 Chronic obstructive pulmonary disease, unspecified: Secondary | ICD-10-CM | POA: Diagnosis not present

## 2017-11-02 DIAGNOSIS — J329 Chronic sinusitis, unspecified: Secondary | ICD-10-CM | POA: Insufficient documentation
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
## 2017-11-02 NOTE — Patient Instructions (Addendum)
Start Sudafed DM or Mucinex DM to help with sinus drainage as well as postnasal drip >>> Can also add Excedrin Migraine during the day to help if you are having sinus pain/headache Continue nasal sprays Nasal rinses as needed for sinus drainage Close follow-up with our office  Notify us once you complete your dental surgery     Please contact the office if your symptoms worsen or you have concerns that you are not improving.   Thank you for choosing [facility] for your healthcare, and for allowing [place] to partner with you on your healthcare journey. I am thankful to be able to provide care to you today.   Wyn Quaker FNP-C

## 2017-11-02 NOTE — Assessment & Plan Note (Signed)
No further antibiotics at this time  We will continue to monitor Saline rinses as well as Beconase spray Follow-up with our office if your symptoms do not continue to slowly improve

## 2017-11-02 NOTE — Assessment & Plan Note (Signed)
Continue inhaler regimen Continue rescue inhaler as needed for shortness of breath Follow-up with our office if your symptoms are worsening or you feel your breathing is not improving  We will hold off on any further prednisone at this point in time

## 2017-11-02 NOTE — Assessment & Plan Note (Signed)
Continue Beconase spray Can start daily antihistamine if they would like Nasal rinses

## 2017-11-04 ENCOUNTER — Other Ambulatory Visit: Payer: Self-pay | Admitting: Internal Medicine

## 2017-11-04 DIAGNOSIS — J441 Chronic obstructive pulmonary disease with (acute) exacerbation: Secondary | ICD-10-CM

## 2017-11-05 NOTE — Progress Notes (Signed)
This encounter was created in error - please disregard.

## 2017-11-11 ENCOUNTER — Ambulatory Visit: Payer: Medicare Other | Admitting: Cardiology

## 2017-11-11 ENCOUNTER — Encounter: Payer: Self-pay | Admitting: Cardiology

## 2017-11-11 VITALS — BP 172/86 | HR 66 | Ht 73.0 in | Wt 243.2 lb

## 2017-11-11 DIAGNOSIS — I9789 Other postprocedural complications and disorders of the circulatory system, not elsewhere classified: Secondary | ICD-10-CM

## 2017-11-11 DIAGNOSIS — I779 Disorder of arteries and arterioles, unspecified: Secondary | ICD-10-CM

## 2017-11-11 DIAGNOSIS — Z01810 Encounter for preprocedural cardiovascular examination: Secondary | ICD-10-CM

## 2017-11-11 DIAGNOSIS — I5032 Chronic diastolic (congestive) heart failure: Secondary | ICD-10-CM

## 2017-11-11 DIAGNOSIS — E785 Hyperlipidemia, unspecified: Secondary | ICD-10-CM

## 2017-11-11 DIAGNOSIS — I350 Nonrheumatic aortic (valve) stenosis: Secondary | ICD-10-CM | POA: Diagnosis not present

## 2017-11-11 DIAGNOSIS — I110 Hypertensive heart disease with heart failure: Secondary | ICD-10-CM | POA: Diagnosis not present

## 2017-11-11 DIAGNOSIS — I4891 Unspecified atrial fibrillation: Secondary | ICD-10-CM | POA: Diagnosis not present

## 2017-11-11 DIAGNOSIS — R600 Localized edema: Secondary | ICD-10-CM

## 2017-11-11 DIAGNOSIS — I739 Peripheral vascular disease, unspecified: Secondary | ICD-10-CM

## 2017-11-11 MED ORDER — IRBESARTAN 300 MG PO TABS: 300.0000 mg | ORAL_TABLET | Freq: Every day | ORAL | 3 refills | Status: DC

## 2017-11-11 MED ORDER — FUROSEMIDE 20 MG PO TABS: 20.0000 mg | ORAL_TABLET | Freq: Every day | ORAL | 3 refills | Status: DC
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
## 2017-11-11 NOTE — Patient Instructions (Addendum)
MEDICATION INSTRUCTIONS  INCREASE IRBESARTAN 300 MG  -ONE TABLET DAILY   FOR THE NEXT 2 TO 3 DAYS TAKE 2 TABLET OF LASIX ( 40 MG TOTAL) AND THEN CHANGE TO TAKING  LASIX 20 MG ONE TABLET DAILY  .     OKAY TO HAVE TEETH EXTRACTIONS - STOP XARELTO 2 DAYS PRIOR AND HOLD 2 DAYS AFTER , THEN RESTART XARELTO. LETTER FAXED TO OFFICE AND YOU HAVE A COPY.    Your physician wants you to follow-up in [place] 13 ,2019 AT 4 PM  DR HARDING.

## 2017-11-13 ENCOUNTER — Encounter: Payer: Self-pay | Admitting: Cardiology

## 2017-11-13 NOTE — Assessment & Plan Note (Signed)
Postop A. fib as of last 2078.  He is on carvedilol for rate control and Xarelto for anticoagulation.  Xarelto can be held 2 days preop and restart a day postop

## 2017-11-13 NOTE — Assessment & Plan Note (Signed)
Known moderate aortic stenosis by most recent echo September 2018.  He will be due for follow-up echocardiogram after I see him back in December.  We reviewed symptoms of concern.

## 2017-11-13 NOTE — Assessment & Plan Note (Signed)
He has had very labile blood pressure in the past.  We did up and down with his medications. Plan: Increase ARB to 300 mg daily. Also will have him take a couple days of increased Lasix to reduce volume and then have him stay on standing dose of Lasix for additional blood pressure control. Continue current dose of carvedilol -- With resting heart rate of 66.  Could potentially increase further if necessary, but will hold off for now.

## 2017-11-13 NOTE — Progress Notes (Signed)
PCP: Remi Haggard, FNP  Clinic Note: Chief Complaint  Patient presents with  . Pre-op Exam    oral surgery  . Atrial Fibrillation    no recurrent Sx  . Edema    worse    HPI: [name] is a 67 y.o. male with a PMH below who presents today for Pre-operative Cardiovascular Examination for Oral Surgery.  PMH notable for:  HTN (with Hypertensive Heart Disease / HFPEF), mild-mod AS, carotid artery disease, COPD (along with multiple other complications related to Agent Orange), multiple GI surgeries related to war injuries. . --> most recent cardiac Dx PAF (was post-op from Knee Sgx) --> converted with Amiodarone gtt, started on Xarelto. He has had 3 cardiac catheterizations - all with minimal CAD. -- most recent R&LHC 09/2016 - angiographically normal tortuous coronaries, mild Pulm HTN with severely elevated LVEDP & systemic HTN.  Mild-mod AS (mean gradient 21 mmHg)  [name] was last seen on 07/05/17 -- > no intermittent shooting pain chest pain, occasionally relieved with nitroglycerin.  Otherwise stable.  No recurrent symptoms of A. fib.  Well-controlled edema with support stockings and standing dose of Lasix.  Not requiring additional dosing.  He was notably very hypertensive, and I started him on ARB - 75 mg irbesartan.  Recent Hospitalizations:   None since last visit  Studies Personally Reviewed - (if available, images/films reviewed: From Epic Chart or Care Everywhere)  none   Interval History: [name] presents here today for preop assessment for dental surgery.  Other than the fact that he has slightly worsening edema from his venous stasis (no PND orthopnea associated), he seems to be doing fairly well from a cardiac standpoint.  His blood pressure is a little better than it was last visit, but still not good enough.  He has not really been taking his Lasix as frequently and is only been doing it as needed as opposed to standing for most days. He really does not  have any anginal type chest pains.  Still has shooting pains that come and go both in his chest and abdomen.  Overall though his GI symptoms seem to have stabilized as well with new medication. He is not having symptoms to suggest recurrence of A. fib: No rapid irregular heartbeats palpitations.  No worsening exertional dyspnea or chest discomfort associated with arrhythmia. No syncope or near.  No bleeding issues with anticoagulation: No melena, hematochezia, hematuria, or epistaxis.  No claudication.  ROS: A comprehensive was performed. Review of Systems  Constitutional: Positive for malaise/fatigue (better ). Negative for chills and fever.  HENT: Negative for congestion and nosebleeds.   Respiratory: Positive for cough (off & on) and shortness of breath (walking - related to joint pain). Negative for sputum production and wheezing.   Cardiovascular: Positive for leg swelling (worse today ). Negative for claudication.  Gastrointestinal: Positive for abdominal pain. Negative for blood in stool, constipation and melena.       Bowels & abdominal pain from adhesions seems to have stabilized -- is on a new medication.  Genitourinary: Negative for hematuria.  Musculoskeletal: Positive for joint pain. Negative for falls (none since last visit).  Neurological: Positive for dizziness (poor center of balance.) and weakness (global - esp legs).  Psychiatric/Behavioral: Positive for memory loss. The patient is nervous/anxious. The patient does not have insomnia.   All other systems reviewed and are negative.  I have reviewed and (if needed) personally updated the patient's problem list, medications, allergies, past medical  and surgical history, social and family history.   Past Medical History:  Diagnosis Date  . Anemia   . Asthma   . Back pain, chronic, followed at pain clinic 11/25/2011  . Carotid artery stenosis 09/06/2014   By Dopplers in September 2017: Right ICA 40-59%.  Less than 40% left  ICA.  [name] Chest pain with low risk for cardiac etiology 10/2011   Non-obstructive CAD by Cath; negative Lexiscan in 08/2011  . Clotting disorder (HCC)    LLE DVT (also small vessel)  . Colon polyps   . COPD (chronic obstructive pulmonary disease) ([place])   . Dementia    Mild  . Depression   . Dyslipidemia (high LDL; low HDL)    statin intolerant; on fibrate  . Edema leg from Venous Stasis    Venous stasis :wears compression stockings; 08/2012 dopplers - no DVT or thrombophlebitis; mild R Popliteal V reflux - no VNUS ablation candidate  . Fainting   . Family history of acute myocardial infarction. and premature CAD 11/25/2011  . GERD (gastroesophageal reflux disease)   . Hearing loss   . History of diabetes mellitus   . Hypertension    very labile  . Moderate aortic stenosis by prior echocardiogram 03/2012   Echo September 2018: Moderate LVH.  EF 65-70%.  No RW MA.  Moderate aortic stenosis (mean gradient 25 mmHg -notable progression since 2013).   . Nephrolithiasis   . Osteoarthritis   . Other idiopathic peripheral autonomic neuropathy    Agent orange  . PTSD (post-traumatic stress disorder) 07/24/2015   per patient approach from foot of bed to awake; do not apply any contricting pressure, also avoid approaching from behind with any loud noises   . Seizure ([place])   . Small bowel obstruction ([place]) 1990s, 2001, 2015   s/p multiple bowel surgeries; from war wounds  . Stroke [facility])     Past Surgical History:  Procedure Laterality Date  . APPENDECTOMY  1958   per patient  . Carotid Dopplers  01/2016   Stable. RICA - slight progression from<40% to 40-59%.  Otherwise stable bilateral carotids and subclavian arteries. Stable LICA  . COLONOSCOPY    . DENTAL SURGERY  01/14/2017   5 extractions in preparation for LTKA on 01-25-17; also was started on amoxicillin x 7days; has since completed therapy   . exploratory laparotomy with extensive lysis of adhesions  10/1999  . JOINT REPLACEMENT      right knee replaced 2x  . LEFT HEART CATHETERIZATION WITH CORONARY ANGIOGRAM N/A 11/26/2011   WNL Lorretta Harp, MD  . NM MYOVIEW LTD  08/2011   Negative lexiscan myoview: No ischemia or infarction  . RIGHT/LEFT HEART CATH AND CORONARY ANGIOGRAPHY N/A 10/22/2016   Procedure: Right/Left Heart Cath and Coronary Angiography;  Surgeon: Leonie Man, MD;  Location: [facility] INVASIVE CV LAB:: Angiographically normal/minimal CAD with tortuous coronary arteries.  Mild pulmonary pretension secondary to severely elevated LVEDP and systemic hypertension.  Mild-moderate aortic valve stenosis with mean gradient 21 mmHg.  [name] small bowel obstruction  1996, 1999, 2001, 2015  . TONSILLECTOMY  1960   per patient  . TOTAL KNEE ARTHROPLASTY    . TOTAL KNEE ARTHROPLASTY Left 01/25/2017   Procedure: LEFT TOTAL KNEE ARTHROPLASTY;  Surgeon: Gaynelle Arabian, MD;  Location: [facility];  Service: Orthopedics;  Laterality: Left;  Adductor Block  . TRANSTHORACIC ECHOCARDIOGRAM  03/2016   Progression from moderate sclerosis noted in 2013: Mild LVH. EF 60-65%. GR 1 DD. Severely restricted calcified  noncoronary cusp of the aortic valve. Mild aortic stenosis (mean gradient 17 mmHg) with mild regurgitation.  . TRANSTHORACIC ECHOCARDIOGRAM  01/2017   Echo September 2018: Moderate LVH.  EF 65-70%.  No RW MA.  Moderate aortic stenosis (mean gradient 25 mmHg -notable progression since 2013).     Current Meds  Medication Sig  . albuterol (ACCUNEB) 0.63 MG/3ML nebulizer solution Take 90 ampules by nebulization every 6 (six) hours as needed for Wheezing.  . Albuterol Sulfate 108 (90 Base) MCG/ACT AEPB Inhale into the lungs.  . ALPRAZolam (XANAX) 1 MG tablet Take 1 tablet (1 mg total) by mouth 3 (three) times daily as needed for anxiety or sleep. (Patient taking differently: Take 1-2 mg by mouth every 8 (eight) hours as needed (for anxiety/PTSD). )  . beclomethasone (BECONASE-AQ) 42 MCG/SPRAY nasal spray Place 1 spray into both nostrils  daily. Dose is for each nostril.  . budesonide-formoterol (SYMBICORT) 160-4.5 MCG/ACT inhaler INHALE 2 PUFFS BY MOUTH TWICE DAILY. RINSE MOUTH WELL  . carvedilol (COREG) 12.5 MG tablet Take 1 tablet (12.5 mg total) by mouth 2 (two) times daily with a meal.  . fluticasone (FLONASE) 50 MCG/ACT nasal spray Place 1 spray into both nostrils daily.  . furosemide (LASIX) 20 MG tablet Take 1 tablet (20 mg total) by mouth daily. MAY TAKE ADDITIONAL TABLET AS NEEDED SWELLING OR SHORTNESS OF BREATH  . Naldemedine Tosylate (SYMPROIC) 0.2 MG TABS Take 0.2 mg by mouth daily at 12 noon.   . nitroGLYCERIN (NITROSTAT) 0.4 MG SL tablet Place 1 tablet (0.4 mg total) under the tongue every 5 (five) minutes as needed for chest pain.  . polyethylene glycol (MIRALAX / GLYCOLAX) packet Take 17 g by mouth 2 (two) times daily as needed for moderate constipation.   . promethazine (PHENERGAN) 25 MG tablet Take 1 tablet (25 mg total) by mouth every 6 (six) hours as needed. FOR NAUSEA (Patient taking differently: Take 25 mg by mouth every 6 (six) hours as needed for nausea or vomiting. FOR NAUSEA)  . Respiratory Therapy Supplies (FLUTTER) DEVI Use as directed  . rivaroxaban (XARELTO) 20 MG TABS tablet Take 1 tablet (20 mg total) daily with supper by mouth.  . [DISCONTINUED] furosemide (LASIX) 20 MG tablet Take 1 tablet (20 mg total) by mouth daily as needed. MAY TAKE ADDITIONAL TABLET AS NEEDED SWELLING OR SHORTNESS OF BREATH  . [DISCONTINUED] irbesartan (AVAPRO) 75 MG tablet Take 1 tablet (75 mg total) by mouth daily.    Allergies  Allergen Reactions  . Bee Venom Anaphylaxis  . Codeine Shortness Of Breath and Rash  . Doxycycline Shortness Of Breath and Rash  . Ivp Dye [Iodinated Diagnostic Agents] Shortness Of Breath    Lost consciousness/difficulty breathing Omni - Paque Contrast   . Levaquin [Levofloxacin] Hives and Shortness Of Breath  . Oxycodone Hcl Shortness Of Breath, Swelling and Rash  . Stadol [Butorphanol]  Shortness Of Breath  . Succinylcholine Chloride Shortness Of Breath, Nausea Only, Rash and Other (See Comments)    Difficulty breathing   . Atorvastatin     myalgias  . Bystolic [Nebivolol Hcl] Other (See Comments)    Nightmares, flashbacks (PTSD)  . Iohexol      Desc: hives,neck and torso erythemia   . Methocarbamol Nausea Only    rash  . Pentazocine Lactate     rash  . Tamiflu [Oseltamivir Phosphate]   . Dilaudid [Hydromorphone Hcl] Nausea Only and Rash    Aggressive   . Synvisc [Hylan G-F 20] Hives and Rash  Social History   Tobacco Use  . Smoking status: Never Smoker  . Smokeless tobacco: Former Network engineer Use Topics  . Alcohol use: Yes    Comment: occasional beer   . Drug use: No   Social History   Social History Narrative   He is a father of 36, grandfather 36.    One of his sons was killed during the war in [place]. Grandson killed bike accident 2017.   He was exposed to [employer].   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   Not very physically active due to extreme disability/debilitation from osteoarthritis and peripheral neuropathy.    family history includes Heart attack in his brother, father, mother, sister, and son; Heart disease in his father and mother; Hypertension in his brother and brother; Non-Hodgkin's lymphoma in his daughter; Sleep apnea in his son; Stroke in his mother and son.  Wt Readings from Last 3 Encounters:  11/11/17 243 lb 3.2 oz (110.3 kg)  11/02/17 232 lb (105.2 kg)  09/23/17 234 lb 9.6 oz (106.4 kg)    PHYSICAL EXAM BP (!) 172/86   Pulse 66   Ht 6\' 1"  (1.854 m)   Wt 243 lb 3.2 oz (110.3 kg)   BMI 32.09 kg/m  Physical Exam  Constitutional: He is oriented to person, place, and time. He appears well-developed. No distress.  Somewhat chronically ill-appearing still.  No longer wearing a mask.  Walks with a cane.  Protuberant abdomen.  Huffing and puffing walking  in  HENT:  Head: Normocephalic and atraumatic.  Neck: Normal range of motion. Neck supple. Hepatojugular reflux (minimal) present. No JVD present. Carotid bruit is present (R sided bruit vs. radiated AS murmur).  Cardiovascular: Normal rate and regular rhythm.  Occasional extrasystoles are present. PMI is not displaced. Exam reveals decreased pulses (diffificult to palpate due to LE edema). Exam reveals no gallop and no friction rub.  Murmur heard.  Medium-pitched harsh crescendo-decrescendo midsystolic murmur is present with a grade of 3/6 at the upper right sternal border radiating to the neck. Pulmonary/Chest: Effort normal and breath sounds normal. No respiratory distress.  Diffuse mild interstitial sounds.  No wheezes rales or rhonchi.  Abdominal: Soft. Bowel sounds are normal. He exhibits no distension. There is tenderness (Around surgical scars.). There is no rebound.  States that his abdomen is less symptomatic since starting a new medication.  Musculoskeletal: Normal range of motion. He exhibits edema (2+ BLE ).  Neurological: He is alert and oriented to person, place, and time.  Psychiatric: His behavior is normal. Judgment and thought content normal.  Odd affect, normal mood     Adult ECG Report  Rate: 66 ;  Rhythm: normal sinus rhythm and non-specific ST-T change; normal axis, intervals and durations  Narrative Interpretation: Essentially normal  Other studies Reviewed: Additional studies/ records that were reviewed today include:  Recent Labs:   Most recent labs available from February 2018 --TC 183, TG 24, LDL 109, HDL 39.  A1c 5.2.  BUN 7 creatinine 8/0.62.  ASSESSMENT / PLAN: Problem List Items Addressed This Visit    Preop cardiovascular exam - Primary (Chronic)    [name] has upcoming oral surgery is considered low risk from a cardiac standpoint.  He himself is not actively having anginal symptoms or CHF symptoms.  (He has not had any evidence of coronary disease by  cardiac catheterization, and  only has hypertensive heart disease related diastolic dysfunction with  intermittent heart failure symptoms).  he does have some mild pulmonary hypertension which is associated with systemic hypertension.  I am increasing her regimen.  He has normal renal function, no history of stroke, and is is a controlled diabetic without insulin.  From a cardiovascular standpoint he would be considered a low risk patient for low risk procedure.  I would not do any cardiac evaluation based on his lack of symptoms.  We will simply increase his diuretic regimen and blood pressure control.  As for his anticoagulation: He is able to stop Xarelto 2 days preop and restart after 1 day postop.      Relevant Orders   EKG 12-Lead (Completed)   Postoperative atrial fibrillation [facility]):  CHA2DS2-VASc Score 6 (Chronic)    Postop A. fib as of last 2078.  He is on carvedilol for rate control and Xarelto for anticoagulation.  Xarelto can be held 2 days preop and restart a day postop      Relevant Medications   furosemide (LASIX) 20 MG tablet   irbesartan (AVAPRO) 300 MG tablet   Other Relevant Orders   EKG 12-Lead (Completed)   Moderate aortic stenosis by prior echocardiogram (Chronic)    Known moderate aortic stenosis by most recent echo September 2018.  He will be due for follow-up echocardiogram after I see him back in December.  We reviewed symptoms of concern.      Relevant Medications   furosemide (LASIX) 20 MG tablet   irbesartan (AVAPRO) 300 MG tablet   Other Relevant Orders   EKG 12-Lead (Completed)   Hypertensive heart disease with chronic diastolic congestive heart failure (HCC) (Chronic)    He has had very labile blood pressure in the past.  We did up and down with his medications. Plan: Increase ARB to 300 mg daily. Also will have him take a couple days of increased Lasix to reduce volume and then have him stay on standing dose of Lasix for additional blood pressure  control. Continue current dose of carvedilol -- With resting heart rate of 66.  Could potentially increase further if necessary, but will hold off for now.      Relevant Medications   furosemide (LASIX) 20 MG tablet   irbesartan (AVAPRO) 300 MG tablet   Hyperlipidemia with target LDL less than 100 (Chronic)    PCP.  They have been reluctant to treat because of insurance issues.  May very well benefit from statin based on labs from last year.      Relevant Medications   furosemide (LASIX) 20 MG tablet   irbesartan (AVAPRO) 300 MG tablet   Carotid artery disease (HCC) (Chronic)    Moderate right-sided carotid disease on most recent Dopplers from February.  Due for annual follow-up in February 2020      Relevant Medications   furosemide (LASIX) 20 MG tablet   irbesartan (AVAPRO) 300 MG tablet   Bilateral lower extremity edema (Chronic)    Worse today.  Not wearing his support stockings either.  I recommend that he double up his Lasix (40 mg) for couple days and stay on 20 mg standing dose as opposed to as needed. Continue to wear support stockings. With the absence of PND orthopnea, this is probably more related to venous stasis.         Current medicines are reviewed at length with the patient today.  (+/- concerns) n/a The following changes have been made:  see below  Patient Instructions  MEDICATION INSTRUCTIONS  INCREASE IRBESARTAN 300 MG  -  ONE TABLET DAILY   FOR THE NEXT 2 TO 3 DAYS TAKE 2 TABLET OF LASIX ( 40 MG TOTAL) AND THEN CHANGE TO TAKING  LASIX 20 MG ONE TABLET DAILY  .     OKAY TO HAVE TEETH EXTRACTIONS - STOP XARELTO 2 DAYS PRIOR AND HOLD 2 DAYS AFTER , THEN RESTART XARELTO. LETTER FAXED TO OFFICE AND YOU HAVE A COPY.    Your physician wants you to follow-up in [place] 13 ,2019 AT 4 PM  DR Cohl Behrens.    Studies Ordered:   Orders Placed This Encounter  Procedures  . EKG 12-Lead      [name], M.D., M.S. Interventional Cardiologist   Pager #  [phone] Phone # [phone] [address] [place], [place] [address]   Thank you for choosing Heartcare at [facility]!!

## 2017-11-13 NOTE — Assessment & Plan Note (Signed)
PCP.  They have been reluctant to treat because of insurance issues.  May very well benefit from statin based on labs from last year.

## 2017-11-13 NOTE — Assessment & Plan Note (Signed)
Moderate right-sided carotid disease on most recent Dopplers from February.  Due for annual follow-up in February 2020

## 2017-11-13 NOTE — Assessment & Plan Note (Addendum)
[name] has upcoming oral surgery is considered low risk from a cardiac standpoint.  He himself is not actively having anginal symptoms or CHF symptoms.  (He has not had any evidence of coronary disease by cardiac catheterization, and  only has hypertensive heart disease related diastolic dysfunction with intermittent heart failure symptoms).  he does have some mild pulmonary hypertension which is associated with systemic hypertension.  I am increasing her regimen.  He has normal renal function, no history of stroke, and is is a controlled diabetic without insulin.  From a cardiovascular standpoint he would be considered a low risk patient for low risk procedure.  I would not do any cardiac evaluation based on his lack of symptoms.  We will simply increase his diuretic regimen and blood pressure control.  As for his anticoagulation: He is able to stop Xarelto 2 days preop and restart after 1 day postop.

## 2017-11-13 NOTE — Assessment & Plan Note (Signed)
Worse today.  Not wearing his support stockings either.  I recommend that he double up his Lasix (40 mg) for couple days and stay on 20 mg standing dose as opposed to as needed. Continue to wear support stockings. With the absence of PND orthopnea, this is probably more related to venous stasis.

## 2017-11-15 ENCOUNTER — Other Ambulatory Visit: Payer: Self-pay | Admitting: Cardiology

## 2017-11-15 MED ORDER — FUROSEMIDE 20 MG PO TABS: 20.0000 mg | ORAL_TABLET | Freq: Every day | ORAL | 3 refills | Status: DC

## 2017-11-15 NOTE — Telephone Encounter (Signed)
New Message:    Pt said Dr Ellyn Hack increased his fluid pill on Thursday. He needs a prescription called in for this please. Please call  [facility] [phone].

## 2017-11-15 NOTE — Telephone Encounter (Signed)
Returned call to patient. Apologized for delay in refill. Rx(s) sent to pharmacy electronically.

## 2018-01-17 ENCOUNTER — Telehealth: Payer: Self-pay | Admitting: Cardiology

## 2018-01-17 NOTE — Telephone Encounter (Signed)
New message  Pt c/o medication issue:  1. Name of Medication: rivaroxaban (XARELTO) 20 MG TABS tablet  2. How are you currently taking this medication (dosage and times per day)? Once a day at night  3. Are you having a reaction (difficulty breathing--STAT)? No   4. What is your medication issue? Patient is having nose bleeds,  Memory issues   Pt c/o BP issue: STAT if pt c/o blurred vision, one-sided weakness or slurred speech  1. What are your last 5 BP readings? 125/57 bp 58 hr  2. Are you having any other symptoms (ex. Dizziness, headache, blurred vision, passed out)? Weakness, headache   3. What is your BP issue? bp has dropped

## 2018-01-18 NOTE — Telephone Encounter (Signed)
Left message to call back  

## 2018-01-18 NOTE — Telephone Encounter (Signed)
Pt reports he is having spontaneous nose bleeds. Pt states he was instructed by Dr Ellyn Hack to hold Xarelto on the days he have the nosebleed. Pt reports he has been following instructions. He reports that nosebleeds has been more since Thursday and yesterday he had two episodes.  Pt also reports that he feel his Blood Pressure has been too low for him. On 8/17 123/55 and 8/19 125/57. He reports on 8/17 he felt weak and had to lay down. He states his BP today got up to 186/77 due to going off on phone company.   Routing to MD for recommendation.

## 2018-01-20 NOTE — Telephone Encounter (Signed)
Ok to hold Xarelto x 2 d to allow nosebleed to heal.  Then restart.   [name], MD

## 2018-01-21 NOTE — Telephone Encounter (Signed)
Left message to call back  

## 2018-01-21 NOTE — Telephone Encounter (Signed)
Pt updated with Dr. Allison Quarry recommendation.Pt states he is going to contact ENT doctor to schedule an appointment as well.

## 2018-01-30 HISTORY — PX: TRANSTHORACIC ECHOCARDIOGRAM: SHX275

## 2018-02-02 ENCOUNTER — Other Ambulatory Visit (HOSPITAL_COMMUNITY): Payer: Medicare Other

## 2018-02-03 ENCOUNTER — Telehealth: Payer: Self-pay | Admitting: *Deleted

## 2018-02-03 DIAGNOSIS — E785 Hyperlipidemia, unspecified: Secondary | ICD-10-CM

## 2018-02-03 DIAGNOSIS — I350 Nonrheumatic aortic (valve) stenosis: Secondary | ICD-10-CM

## 2018-02-03 NOTE — Telephone Encounter (Signed)
LET MESSAGE TO CALL BACK - IN REGARDS TO LAB RESULTS

## 2018-02-03 NOTE — Telephone Encounter (Signed)
-----  Message from Leonie Man, MD sent at 01/31/2018  2:21 PM EDT ----- Labs: Sodium 138, potassium 4.3, chloride 103, bicarb 26, BUN 28, creatinine 0.9, glucose 112, calcium 9.8.  Alk phos 97, ALT 22, AST 20. --> NORMAL Lipids: TC 210, TG 243, LDL 117, HDL 44 -- CHOLESTEROL LEVELS ARE TOO HIGH TSH 4.3 low total T3 of 67 and low total T4 5.16 CBC: W6.5, H/H 13.2/40, platelet 262  I would like to start rosuvastatin 10 mg 3 days a week --> if tolerating this after 2 weeks, increase daily.  Recheck lipids in 4 months.  [name], MD

## 2018-02-04 ENCOUNTER — Telehealth: Payer: Self-pay | Admitting: Cardiology

## 2018-02-04 NOTE — Telephone Encounter (Signed)
New Message ° ° ° ° ° ° ° ° ° °Patient returned your call °

## 2018-02-09 ENCOUNTER — Ambulatory Visit (HOSPITAL_COMMUNITY): Payer: Medicare Other | Attending: Cardiology

## 2018-02-09 ENCOUNTER — Other Ambulatory Visit: Payer: Self-pay

## 2018-02-09 DIAGNOSIS — I4891 Unspecified atrial fibrillation: Secondary | ICD-10-CM | POA: Insufficient documentation

## 2018-02-09 DIAGNOSIS — Z8673 Personal history of transient ischemic attack (TIA), and cerebral infarction without residual deficits: Secondary | ICD-10-CM | POA: Diagnosis not present

## 2018-02-09 DIAGNOSIS — I10 Essential (primary) hypertension: Secondary | ICD-10-CM | POA: Diagnosis not present

## 2018-02-09 DIAGNOSIS — J449 Chronic obstructive pulmonary disease, unspecified: Secondary | ICD-10-CM | POA: Diagnosis not present

## 2018-02-09 DIAGNOSIS — I352 Nonrheumatic aortic (valve) stenosis with insufficiency: Secondary | ICD-10-CM | POA: Insufficient documentation

## 2018-02-09 DIAGNOSIS — I200 Unstable angina: Secondary | ICD-10-CM | POA: Diagnosis present

## 2018-02-09 DIAGNOSIS — I6529 Occlusion and stenosis of unspecified carotid artery: Secondary | ICD-10-CM | POA: Diagnosis not present

## 2018-02-09 DIAGNOSIS — R0600 Dyspnea, unspecified: Secondary | ICD-10-CM | POA: Diagnosis not present

## 2018-02-09 DIAGNOSIS — E785 Hyperlipidemia, unspecified: Secondary | ICD-10-CM | POA: Insufficient documentation

## 2018-02-09 DIAGNOSIS — R600 Localized edema: Secondary | ICD-10-CM

## 2018-02-09 DIAGNOSIS — I350 Nonrheumatic aortic (valve) stenosis: Secondary | ICD-10-CM

## 2018-02-09 DIAGNOSIS — E119 Type 2 diabetes mellitus without complications: Secondary | ICD-10-CM | POA: Insufficient documentation

## 2018-02-11 ENCOUNTER — Encounter: Payer: Self-pay | Admitting: *Deleted

## 2018-02-11 MED ORDER — ROSUVASTATIN CALCIUM 10 MG PO TABS: 10.0000 mg | ORAL_TABLET | Freq: Every day | ORAL | 6 refills | Status: DC

## 2018-02-11 NOTE — Telephone Encounter (Signed)
SPOKE TO PATIENT . RESULTS GIVEN FOR ECHO AND LAB- NEW PRESCRIPTION PATIENT VERBALIZED UNDERSTANDING.

## 2018-02-11 NOTE — Telephone Encounter (Signed)
-----   Message from Leonie Man, MD sent at 02/09/2018 11:59 PM EDT ----- Transthoracic echo result: Mild LVH -goes along with high blood pressure as does grade 1 diastolic dysfunction (normal for age). Normal pump function with ejection fraction of 60 to 65%.  Moderate to severe aortic stenosis with mild to moderate regurgitation.  Mean gradient now 36 mmHg and a peak gradient 65 mmHg. -->  This does suggest progression of aortic stenosis with last year's mean gradient mean 25 mmHg.  Not yet at a level that we would consider valve replacement surgery, but getting close.  We should reassess 2 D Echo in 6 months to ensure that it is not progressively worsening.  Pls order Echo for March 2020.  [name], MD

## 2018-02-11 NOTE — Telephone Encounter (Signed)
Spoke to patient. Aware start Rosuvastatin 10 mg  START 3 TIMES A WEEK FOR 2-3 WEEKS THEN DAILY  Result of echo given   orders placed .  patient a copy to be mailed  To him ,so he can [facility] to [facility]

## 2018-02-24 ENCOUNTER — Other Ambulatory Visit: Payer: Self-pay

## 2018-02-24 DIAGNOSIS — I739 Peripheral vascular disease, unspecified: Principal | ICD-10-CM

## 2018-02-24 DIAGNOSIS — I779 Disorder of arteries and arterioles, unspecified: Secondary | ICD-10-CM

## 2018-03-28 ENCOUNTER — Ambulatory Visit: Payer: Medicare Other | Admitting: Internal Medicine

## 2018-04-14 ENCOUNTER — Telehealth: Payer: Self-pay | Admitting: Cardiology

## 2018-04-14 NOTE — Telephone Encounter (Signed)
New Message:     Pt says he needs a prescription for his Xarelto, Carvedilol, Furosemide and Irbesartan for the [place], so he can get his medicine there. He also says he needs a summary of why Dr Ellyn Hack have him taking these medicine please.

## 2018-04-14 NOTE — Telephone Encounter (Signed)
Returned call to patient.He stated he needed 90 day written prescriptions on medications listed below to take to [place] in [place].He also needs copy of Dr.Harding's last office note to take too.Advised Dr.Harding and his RN out of office today.I will send message to Dr.Harding's RN.

## 2018-04-14 NOTE — Telephone Encounter (Signed)
Returned call to patient no answer.LMTC. 

## 2018-04-15 MED ORDER — IRBESARTAN 300 MG PO TABS: 300.0000 mg | ORAL_TABLET | Freq: Every day | ORAL | 3 refills | Status: DC

## 2018-04-15 MED ORDER — CARVEDILOL 12.5 MG PO TABS: 12.5000 mg | ORAL_TABLET | Freq: Two times a day (BID) | ORAL | 3 refills | Status: DC

## 2018-04-15 MED ORDER — FUROSEMIDE 20 MG PO TABS: 20.0000 mg | ORAL_TABLET | Freq: Every day | ORAL | 3 refills | Status: DC

## 2018-04-15 MED ORDER — ROSUVASTATIN CALCIUM 10 MG PO TABS: 10.0000 mg | ORAL_TABLET | Freq: Every day | ORAL | 3 refills | Status: DC

## 2018-04-15 MED ORDER — RIVAROXABAN 20 MG PO TABS: 20.0000 mg | ORAL_TABLET | Freq: Every day | ORAL | 3 refills | Status: DC

## 2018-04-15 NOTE — Telephone Encounter (Signed)
COPY OF  LAST OFFICE NOTE , LABS , ECHO CAROTID AND LEFT/RIGHT HEART CATH  WITH PRESCRIPTIONS  MADE FOR PATIENT TO PICK UP   NEED SIGNATURE WILL  INFORM PATIENT ON MONDAY

## 2018-04-18 NOTE — Telephone Encounter (Signed)
Patient aware information  IS READY FOR PICK UP

## 2018-04-19 ENCOUNTER — Other Ambulatory Visit: Payer: Self-pay | Admitting: Cardiology

## 2018-04-25 ENCOUNTER — Encounter: Payer: Self-pay | Admitting: Internal Medicine

## 2018-04-25 ENCOUNTER — Ambulatory Visit: Payer: Medicare Other | Admitting: Internal Medicine

## 2018-04-25 DIAGNOSIS — J441 Chronic obstructive pulmonary disease with (acute) exacerbation: Secondary | ICD-10-CM | POA: Diagnosis not present

## 2018-04-25 DIAGNOSIS — J449 Chronic obstructive pulmonary disease, unspecified: Secondary | ICD-10-CM

## 2018-04-25 DIAGNOSIS — K219 Gastro-esophageal reflux disease without esophagitis: Secondary | ICD-10-CM

## 2018-04-25 DIAGNOSIS — R911 Solitary pulmonary nodule: Secondary | ICD-10-CM

## 2018-04-25 MED ORDER — BUDESONIDE-FORMOTEROL FUMARATE 160-4.5 MCG/ACT IN AERO: INHALATION_SPRAY | RESPIRATORY_TRACT | 3 refills | Status: DC

## 2018-04-25 NOTE — Patient Instructions (Signed)
Script printed for Symbicort  Please call if we can help

## 2018-04-25 NOTE — Progress Notes (Signed)
HPI M Veteran, Never smoker followed for Bronchiectasis/recurrent pneumonia/Enterobacter due to reflux, complicated by DM, HBP, lung nodule, GERD, ischemic colitis/ multiple surgeries, valvular heart disease Office Spirometry 01/16/16-moderate restriction of exhaled volume, at least mild obstructive airways disease. FVC 3.02/59%, FEV1 2.13/56%, ratio 0.95, FEF 25-75% 1.28/42%  --------------------------------------------------------  09/23/2017- 67 year old male [name], never smoker, followed for Bronchiectasis/ recurrent pneumonia/Enterobacter/recurrent aspiration, complicated by DM, HBP, lung nodule, GERD, ischemic colitis/multiple surgeries, valvular heart disease, ASCVD, AFib ----COPD- breathing worse, SOB with all exertion, wheezing, productive cough yellowish -greenish, took nebulizer this morning and rescue inhaler 30 minutes prior to visit Symbicort 160, flutter device, nebulizer Describes 1 week of increased cough dyspnea without fever.  More frequent epistaxis on Xarelto after A. fib complicated left TKR last fall.  Tussive parasternal pains.  Cardiology is following for his valvular murmur.  Left ankle edema occasionally when his obstipation is worse. CXR 02/07/2017- IMPRESSION: Low lung volumes with basilar atelectasis.  04/25/2018- 67 year old male [name], never smoker, followed for Bronchiectasis/ recurrent pneumonia/Enterobacter/recurrent aspiration, complicated by DM, HBP, lung nodule, GERD, ischemic colitis/multiple surgeries, valvular heart disease, ASCVD, AFib  ------6 month follow up for COPD.  Had to see Dr. Johnnette Gourd for epistaxis now controlled. Sleeps nearly upright with pillows or on couch to minimize reflux and cough.  Denies acute respiratory events.  Has had flu shot. CXR 09/24/2017- No acute cardiopulmonary disease. Symbicort 160, Beconase AQ nasal spray,  ROS-   += positive Constitutional:   No-   weight loss, night sweats, +fevers, chills, fatigue,  lassitude. HEENT:   No-  headaches, difficulty swallowing, +tooth/dental problems, sore throat,        sneezing, itching, ear ache, nasal congestion, post nasal drip,  CV:      chest pain, +orthopnea, PND, swelling in lower extremities, anasarca, dizziness, +palpitations Resp: + shortness of breath with exertion or at rest.                +productive cough,  No non-productive cough,              +change in color of mucus.  No- wheezing.   Skin: No-   rash or lesions. GI:  No-   heartburn, indigestion, +abdominal pain, nausea, vomiting,  GU:  MS:  +  joint pain or swelling.  Neuro-     nothing unusual Psych:  No- change in mood or affect. + depression or anxiety.  No memory loss  OBJ- Physical Exam    General- Alert, Oriented, + anxious/depressed, Distress- none acute, + overweight Skin- + fungal nail changes right hand and also reported on toes of both feet Lymphadenopathy- none Head- atraumatic            Eyes- Gross vision intact, PERRLA, conjunctivae and secretions clear            Ears- Hearing, canals-normal            Nose-  no-Septal dev,polyps, erosion, perforation             Throat- Mallampati II , mucosa clear , drainage- none, tonsils- atrophic. + Missing teeth,  Neck- flexible , trachea midline, no stridor , thyroid nl, carotid no bruit Chest - symmetrical excursion , unlabored           Heart/CV- RRR ,  murmur + 2/6 S aortic , no gallop  , no rub, nl s1 s2                           -  JVD- none , edema+1/ support hose, stasis changes- none, varices- none           Lung-+ coarse, unlabored, cough-none, wheeze -none                  dullness-none, rub- none           Chest wall-  Abd-  Br/ Gen/ Rectal- Not done, not indicated Extrem-  +cane,  Neuro- grossly intact to observation

## 2018-05-13 ENCOUNTER — Ambulatory Visit: Payer: Medicare Other | Admitting: Cardiology

## 2018-06-15 ENCOUNTER — Ambulatory Visit: Payer: Medicare HMO | Admitting: Cardiology

## 2018-06-15 ENCOUNTER — Other Ambulatory Visit: Payer: Self-pay

## 2018-06-15 ENCOUNTER — Encounter: Payer: Self-pay | Admitting: Cardiology

## 2018-06-15 VITALS — BP 158/82 | HR 68 | Ht 73.0 in | Wt 246.8 lb

## 2018-06-15 DIAGNOSIS — E785 Hyperlipidemia, unspecified: Secondary | ICD-10-CM | POA: Diagnosis not present

## 2018-06-15 DIAGNOSIS — M13841 Other specified arthritis, right hand: Secondary | ICD-10-CM | POA: Diagnosis not present

## 2018-06-15 DIAGNOSIS — I9789 Other postprocedural complications and disorders of the circulatory system, not elsewhere classified: Secondary | ICD-10-CM

## 2018-06-15 DIAGNOSIS — I110 Hypertensive heart disease with heart failure: Secondary | ICD-10-CM | POA: Diagnosis not present

## 2018-06-15 DIAGNOSIS — R0600 Dyspnea, unspecified: Secondary | ICD-10-CM

## 2018-06-15 DIAGNOSIS — I5032 Chronic diastolic (congestive) heart failure: Secondary | ICD-10-CM

## 2018-06-15 DIAGNOSIS — I6521 Occlusion and stenosis of right carotid artery: Secondary | ICD-10-CM

## 2018-06-15 DIAGNOSIS — R0609 Other forms of dyspnea: Secondary | ICD-10-CM

## 2018-06-15 DIAGNOSIS — I350 Nonrheumatic aortic (valve) stenosis: Secondary | ICD-10-CM | POA: Diagnosis not present

## 2018-06-15 DIAGNOSIS — I4891 Unspecified atrial fibrillation: Secondary | ICD-10-CM

## 2018-06-15 DIAGNOSIS — Z01810 Encounter for preprocedural cardiovascular examination: Secondary | ICD-10-CM

## 2018-06-15 DIAGNOSIS — M25531 Pain in right wrist: Secondary | ICD-10-CM | POA: Diagnosis not present

## 2018-06-15 DIAGNOSIS — R600 Localized edema: Secondary | ICD-10-CM

## 2018-06-15 MED ORDER — APIXABAN 5 MG PO TABS: 5.0000 mg | ORAL_TABLET | Freq: Two times a day (BID) | ORAL | 3 refills | Status: DC

## 2018-06-15 MED ORDER — CARVEDILOL 25 MG PO TABS: 25.0000 mg | ORAL_TABLET | Freq: Two times a day (BID) | ORAL | 3 refills | Status: DC

## 2018-06-15 NOTE — Patient Instructions (Addendum)
Medication Instructions: ASK  V.A. OR INSURANCE - WHAT IS ON YOUR FORMULARY  REPLACEMENT FOR AVAPRO   AND NOTIFY OFFICE  STOP XARELTO AND THEN START THE BELOW  START TAKING  ELIQUIS 5 MG  TWICE A DAY   INCREASE TAKING CARVEDILOL 25 MG TWICE A DAY ,  UNTIL YOU GET THE NEW PRESCRIPTION FILLED  TAKE 1 AND 1/2 TABLETS TWICE A DAY THEN START  25 MG TWICE A DAY  If you need a refill on your cardiac medications before your next appointment, please call your pharmacy.   Lab work: NOT NEEDED If you have labs (blood work) drawn today and your tests are completely normal, you will receive your results only by: [name] MyChart Message (if you have MyChart) OR . A paper copy in the mail If you have any lab test that is abnormal or we need to change your treatment, we will call you to review the results.  Testing/Procedures:  SCHEDULE IN MARCH 2020 AT [place] Your physician has requested that you have an echocardiogram. Echocardiography is a painless test that uses sound waves to create images of your heart. It provides your doctor with information about the size and shape of your heart and how well your heart's chambers and valves are working. This procedure takes approximately one hour. There are no restrictions for this procedure.   Follow-Up: At [facility], you and your health needs are our priority.  As part of our continuing mission to provide you with exceptional heart care, we have created designated Provider Care Teams.  These Care Teams include your primary Cardiologist (physician) and Advanced Practice Providers (APPs -  Physician Assistants and Nurse Practitioners) who all work together to provide you with the care you need, when you need it. You will need a follow up appointment in 4 months.  Please call our office 2 months in advance to schedule this appointment.  You may see [name], MD or one of the following Advanced Practice Providers on your designated  Care Team:   Rosaria Ferries, PA-C . Jory Sims, DNP, ANP  Any Other Special Instructions Will Be Listed Below (If Applicable).

## 2018-06-15 NOTE — Progress Notes (Signed)
PCP: Hayden Rasmussen, MD  Clinic Note: Chief Complaint  Patient presents with  . Follow-up    Stable from cardiac standpoint.  Complains of right shoulder and wrist pain.  Recent ileus  . Cardiac Valve Problem    Aortic stenosis/regurgitation  . Atrial Fibrillation    Xarelto no longer covered  . Hypertension    Irbesartan no longer covered    HPI: [name] is a 68 y.o. male with a complicated past medical history noted above (multiple conditions stemming from agent orange exposure and or related injuries from the [place] War).  He has recently diagnosed atrial fibrillation and moderate to severe aortic stenosis.[name]  He now presents for 39-month follow-up.  PMH notable for:  HTN (with Hypertensive Heart Disease / HFPEF - on Lasix & support socks), mod-Severe AS, carotid artery disease, COPD (along with multiple other complications related to Agent Orange), multiple GI surgeries related to war injuries. . --> most recent cardiac Dx PAF (was post-op from Knee Sgx) --> converted with Amiodarone gtt, started on Xarelto.  He has had 3 cardiac catheterizations - all with minimal CAD. -- most recent R&LHC 09/2016 - angiographically normal tortuous coronaries, mild Pulm HTN with severely elevated LVEDP & systemic HTN.    Moderate to severe AS (mean gradient 36 mmHg)  Started on Irbesartan 75 mg for HTN (Feb 2019) -has been titrated up.  [name] was last seen on November 11, 2017 for preop evaluation for upcoming oral surgery.  (He has had the surgery, and is still having issues with getting set up for his dentures and dental implants to the [place]) -> the only issue at that time was having some more edema than usual (had not been taking Lasix as much as usual).  No recurrence of A. fib.  No chest pain intermittent GI pain. -->  Irbesartan titrated up to 300 mg daily, and temporary increase of Lasix.  Recent Hospitalizations:   No hospitalization, but had oral surgery.--Had several  teeth pulled, but is yet to have dental implants.  Studies Personally Reviewed - (if available, images/films reviewed: From Epic Chart or Care Everywhere)  Transthoracic Echo September 2019: Mild LVH.  EF 60 to 65%.  GR 1 DD.  Moderate to severe aortic stenosis.  Mean gradient 36 mmHg.  Peak 65 mmHg. -->  This does show progression from last year.  (Plan to follow-up echo March 2020)   Interval History: [name] presents for routine follow-up, he notes that he is him to deal with try to get his dental issues fixed and has been having 1 to let another. He also was just evaluated by orthopedic surgery for essentially frozen right shoulder and profoundly arthritic right hand/wrist.  He was told that there is no surgical option because of concerns for his heart (because he went to A. fib RVR during his knee surgery). From a cardiac standpoint however, he has been pretty darn stable.  His blood pressures have been getting up little bit, but this is probably because of his discomfort. He had a brief 15 to 20-minute run of irregular heartbeat in the setting of short episode of ileus a few weeks weeks ago.  Otherwise is not had anything to suggest recurrence of A. Fib.  His edema has been pretty well controlled and he is not using that any additional doses.  He is now taking his Lasix just about every day though as such is not having to take additional doses.  He is concerned  because the [place] pharmacist has told him that they no longer carry irbesartan, nor is Xarelto on his formulary.  Although he still has some mild edema, as long as he takes his Lasix, he is not having any PND or orthopnea.  He does not sleep lying flat because of GI issues. Only that one episode of irregular heartbeats but nothing since.  No syncope or near syncope.  No TIA or amaurosis fugax.  No melena, hematochezia, hematuria or epistaxis on anticoagulation. No claudication  ROS: A comprehensive was performed. Review of Systems    Constitutional: Negative for chills, fever and malaise/fatigue (better ).  HENT: Negative for congestion and nosebleeds (None recently).        With no dentures, is having to "gum his food "  Respiratory: Positive for shortness of breath (walking - related to joint pain). Negative for cough (off & on), sputum production and wheezing.   Cardiovascular: Positive for palpitations (off & on - no more than 15 min) and leg swelling (Controlled with daily Lasix). Negative for claudication.  Gastrointestinal: Positive for abdominal pain. Negative for blood in stool, constipation and melena.       Bowels & abdominal pain from adhesions seems to have stabilized -- is on a new medication.  Genitourinary: Negative for hematuria.  Musculoskeletal: Positive for joint pain (L knee - recent TKA is loose.  R shoulder frozen.  R hand also severe OA. ). Negative for falls (none since last visit).  Neurological: Positive for dizziness (poor center of balance.) and weakness (global - esp legs).  Psychiatric/Behavioral: Positive for memory loss. Negative for depression. The patient is nervous/anxious (now having PTSD since [place] conflict acting up -). The patient does not have insomnia.   All other systems reviewed and are negative.  I have reviewed and (if needed) personally updated the patient's problem list, medications, allergies, past medical and surgical history, social and family history.   Past Medical History:  Diagnosis Date  . Anemia   . Asthma   . Back pain, chronic, followed at pain clinic 11/25/2011  . Carotid artery stenosis 09/06/2014   By Dopplers in September 2017: Right ICA 40-59%.  Less than 40% left ICA.  [name] Chest pain with low risk for cardiac etiology 10/2011   Non-obstructive CAD by Cath; negative Lexiscan in 08/2011  . Clotting disorder (HCC)    LLE DVT (also small vessel)  . Colon polyps   . COPD (chronic obstructive pulmonary disease) ([place])   . Dementia ([place])    Mild  . Depression    . Dyslipidemia (high LDL; low HDL)    statin intolerant; on fibrate  . Edema leg from Venous Stasis    Venous stasis :wears compression stockings; 08/2012 dopplers - no DVT or thrombophlebitis; mild R Popliteal V reflux - no VNUS ablation candidate  . Fainting   . Family history of acute myocardial infarction. and premature CAD 11/25/2011  . GERD (gastroesophageal reflux disease)   . Hearing loss   . History of diabetes mellitus   . Hypertension    very labile  . Moderate aortic stenosis by prior echocardiogram 03/2012    Mild LVH.  EF 60 to 65%.  GR 1 DD.  Moderate to severe aortic stenosis.  Mean gradient 36 mmHg.  Peak 65 mmHg. -->  This does show progression from last year.  (Plan to follow-up echo March 2020)  . Nephrolithiasis   . Osteoarthritis   . Other idiopathic peripheral autonomic neuropathy    Agent  orange  . PTSD (post-traumatic stress disorder) 07/24/2015   per patient approach from foot of bed to awake; do not apply any contricting pressure, also avoid approaching from behind with any loud noises   . Seizure ([place])   . Small bowel obstruction ([place]) 1990s, 2001, 2015   s/p multiple bowel surgeries; from war wounds  . Stroke [facility])     Past Surgical History:  Procedure Laterality Date  . APPENDECTOMY  1958   per patient  . Carotid Dopplers  01/2016   Stable. RICA - slight progression from<40% to 40-59%.  Otherwise stable bilateral carotids and subclavian arteries. Stable LICA  . COLONOSCOPY    . DENTAL SURGERY  01/14/2017   5 extractions in preparation for LTKA on 01-25-17; also was started on amoxicillin x 7days; has since completed therapy   . exploratory laparotomy with extensive lysis of adhesions  10/1999  . JOINT REPLACEMENT     right knee replaced 2x  . LEFT HEART CATHETERIZATION WITH CORONARY ANGIOGRAM N/A 11/26/2011   WNL Lorretta Harp, MD  . NM MYOVIEW LTD  08/2011   Negative lexiscan myoview: No ischemia or infarction  . RIGHT/LEFT HEART CATH AND  CORONARY ANGIOGRAPHY N/A 10/22/2016   Procedure: Right/Left Heart Cath and Coronary Angiography;  Surgeon: Leonie Man, MD;  Location: [facility] INVASIVE CV LAB:: Angiographically normal/minimal CAD with tortuous coronary arteries.  Mild pulmonary pretension secondary to severely elevated LVEDP and systemic hypertension.  Mild-moderate aortic valve stenosis with mean gradient 21 mmHg.  [name] small bowel obstruction  1996, 1999, 2001, 2015  . TONSILLECTOMY  1960   per patient  . TOTAL KNEE ARTHROPLASTY    . TOTAL KNEE ARTHROPLASTY Left 01/25/2017   Procedure: LEFT TOTAL KNEE ARTHROPLASTY;  Surgeon: Gaynelle Arabian, MD;  Location: [facility];  Service: Orthopedics;  Laterality: Left;  Adductor Block  . TRANSTHORACIC ECHOCARDIOGRAM  01/2018    Mild LVH.  EF 60 to 65%.  GR 1 DD.  Moderate to severe aortic stenosis.  Mean gradient 36 mmHg.  Peak 65 mmHg. -->  This does show progression from last year.  (Plan to follow-up echo March 2020)  . TRANSTHORACIC ECHOCARDIOGRAM  01/2017   Echo September 2018: Moderate LVH.  EF 65-70%.  No RW MA.  Moderate aortic stenosis (mean gradient 25 mmHg -notable progression since 2013).     Current Meds  Medication Sig  . albuterol (ACCUNEB) 0.63 MG/3ML nebulizer solution Take 1 ampule by nebulization every 6 (six) hours as needed for wheezing or shortness of breath.   . Albuterol Sulfate 108 (90 Base) MCG/ACT AEPB Inhale into the lungs.  . ALPRAZolam (XANAX) 1 MG tablet Take 1 tablet (1 mg total) by mouth 3 (three) times daily as needed for anxiety or sleep. (Patient taking differently: Take 1-2 mg by mouth every 8 (eight) hours as needed (for anxiety/PTSD). )  . beclomethasone (BECONASE-AQ) 42 MCG/SPRAY nasal spray Place 1 spray into both nostrils daily. Dose is for each nostril.  . budesonide-formoterol (SYMBICORT) 160-4.5 MCG/ACT inhaler INHALE 2 PUFFS BY MOUTH TWICE DAILY. RINSE MOUTH WELL  . furosemide (LASIX) 20 MG tablet Take 1 tablet (20 mg total) by mouth daily. MAY TAKE  ADDITIONAL TABLET AS NEEDED SWELLING OR SHORTNESS OF BREATH  . irbesartan (AVAPRO) 300 MG tablet Take 1 tablet (300 mg total) by mouth daily.  [name] morphine (MSIR) 15 MG tablet TK 1 T PO Q 4 H PRN P  . Naldemedine Tosylate 0.2 MG TABS Take 0.2 mg by mouth as needed.  [name]  nitroGLYCERIN (NITROSTAT) 0.4 MG SL tablet Place 1 tablet (0.4 mg total) under the tongue every 5 (five) minutes as needed for chest pain.  . polyethylene glycol (MIRALAX / GLYCOLAX) packet Take 17 g by mouth 2 (two) times daily as needed for moderate constipation.   [name] Respiratory Therapy Supplies (FLUTTER) DEVI Use as directed  . rosuvastatin (CRESTOR) 10 MG tablet Take 1 tablet (10 mg total) by mouth daily.  . [DISCONTINUED] carvedilol (COREG) 12.5 MG tablet Take 1 tablet (12.5 mg total) by mouth 2 (two) times daily with a meal.  . [DISCONTINUED] XARELTO 20 MG TABS tablet TAKE 1 TABLET(20 MG) BY MOUTH DAILY WITH SUPPER    Allergies  Allergen Reactions  . Bee Venom Anaphylaxis  . Codeine Shortness Of Breath and Rash  . Doxycycline Shortness Of Breath and Rash  . Ivp Dye [Iodinated Diagnostic Agents] Shortness Of Breath    Lost consciousness/difficulty breathing Omni - Paque Contrast   . Levaquin [Levofloxacin] Hives and Shortness Of Breath  . Oxycodone Hcl Shortness Of Breath, Swelling and Rash  . Stadol [Butorphanol] Shortness Of Breath  . Succinylcholine Chloride Shortness Of Breath, Nausea Only, Rash and Other (See Comments)    Difficulty breathing   . Atorvastatin     myalgias  . Bystolic [Nebivolol Hcl] Other (See Comments)    Nightmares, flashbacks (PTSD)  . Iohexol      Desc: hives,neck and torso erythemia   . Methocarbamol Nausea Only    rash  . Pentazocine Lactate     rash  . Tamiflu [Oseltamivir Phosphate]   . Dilaudid [Hydromorphone Hcl] Nausea Only and Rash    Aggressive   . Synvisc [Hylan G-F 20] Hives and Rash    Social History   Tobacco Use  . Smoking status: Never Smoker  . Smokeless  tobacco: Former Network engineer Use Topics  . Alcohol use: Yes    Comment: occasional beer   . Drug use: No   Social History   Social History Narrative   He is a father of 83, grandfather 37.    One of his sons was killed during the war in [place]. Grandson killed bike accident 2017.   He was exposed to [employer].   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   Not very physically active due to extreme disability/debilitation from osteoarthritis and peripheral neuropathy.    family history includes Heart attack in his brother, father, mother, sister, and son; Heart disease in his father and mother; Hypertension in his brother and brother; Non-Hodgkin's lymphoma in his daughter; Sleep apnea in his son; Stroke in his mother and son.  Wt Readings from Last 3 Encounters:  06/15/18 246 lb 12.8 oz (111.9 kg)  04/25/18 238 lb (108 kg)  11/11/17 243 lb 3.2 oz (110.3 kg)    PHYSICAL EXAM BP (!) 158/82 (BP Location: Right Arm, Patient Position: Sitting, Cuff Size: Normal)   Pulse 68   Ht 6\' 1"  (1.854 m)   Wt 246 lb 12.8 oz (111.9 kg)   BMI 32.56 kg/m  Physical Exam  Constitutional: He is oriented to person, place, and time. He appears well-developed and well-nourished. No distress.  Somewhat chronically ill-appearing -but actually looks pretty good today.[name] with a cane.  Protuberant abdomen.   HENT:  Head: Normocephalic and atraumatic.  Neck: Normal range of motion. Neck supple. Hepatojugular reflux (minimal) present. No JVD present. Carotid bruit is present (R sided bruit vs.  radiated AS murmur).  Cardiovascular: Normal rate, regular rhythm, S1 normal and S2 normal.  Occasional extrasystoles are present. PMI is not displaced. Exam reveals distant heart sounds and decreased pulses (diffificult to palpate due to LE edema). Exam reveals no gallop and no friction rub.  Murmur heard.  Medium-pitched harsh crescendo-decrescendo  midsystolic murmur is present with a grade of 3/6 at the upper right sternal border radiating to the neck. Pulmonary/Chest: Effort normal. No respiratory distress. He has no wheezes. He has no rales.  Diffuse mild interstitial sounds.  No wheezes rales or rhonchi.  Abdominal: Soft. Bowel sounds are normal. He exhibits no distension. There is abdominal tenderness (Around surgical scars.). There is no rebound.  States that his abdomen is less symptomatic since starting a new medication.  Musculoskeletal: Normal range of motion.        General: No edema (2+ BLE ).  Neurological: He is alert and oriented to person, place, and time.  Psychiatric: He has a normal mood and affect. His behavior is normal. Judgment and thought content normal.  Odd affect, normal mood  Vitals reviewed.    Adult ECG Report  Rate: 66 ;  Rhythm: normal sinus rhythm and non-specific ST-T change; normal axis, intervals and durations  Narrative Interpretation: Essentially normal  Other studies Reviewed: Additional studies/ records that were reviewed today include:  Recent Labs:    Lab Results  Component Value Date   CHOL 158 06/15/2018   HDL 47 06/15/2018   LDLCALC 76 06/15/2018   LDLDIRECT 66.4 02/02/2007   TRIG 174 (H) 06/15/2018   CHOLHDL 3.4 06/15/2018   Lab Results  Component Value Date   CREATININE 0.71 02/09/2017   BUN 8 02/09/2017   NA 133 (L) 02/09/2017   K 3.5 02/09/2017   CL 102 02/09/2017   CO2 21 (L) 02/09/2017   (checked today)  ASSESSMENT / PLAN: Problem List Items Addressed This Visit    Bilateral lower extremity edema (Chronic)   Carotid artery disease (HCC) (Chronic)    Is due for follow-up carotid Dopplers next month.  Continue blood pressure control and statin.      Relevant Medications   apixaban (ELIQUIS) 5 MG TABS tablet   carvedilol (COREG) 25 MG tablet   DOE (dyspnea on exertion) (Chronic)   Relevant Orders   ECHOCARDIOGRAM COMPLETE   Hyperlipidemia with target LDL  less than 100 (Chronic)   Relevant Medications   apixaban (ELIQUIS) 5 MG TABS tablet   carvedilol (COREG) 25 MG tablet   Hypertensive heart disease with chronic diastolic congestive heart failure (HCC) (Chronic)    He has had labile blood pressures in the past, but now is quite high.  He is on irbesartan 300 mg which we will plan to switch to a different ARB once we find out what is on the formulary.  Plan: Increase carvedilol to 25 mg twice daily. He will call back with formulary ARB from [place].  Based on that, either myself or Pharm.D. will provide feedback as to which ARB to switch to and what dose.      Relevant Medications   apixaban (ELIQUIS) 5 MG TABS tablet   carvedilol (COREG) 25 MG tablet   Moderate aortic stenosis by prior echocardiogram - Primary (Chronic)    Progression now to moderate-severe aortic stenosis with a mean gradient of 36 mmHg.  This is pretty rapid progression so we will recheck in 6 months. He would certainly not be a very good candidate for open valve surgery, but would  probably be a better TAVR candidate.  At that time would need TEE and preop right and left heart cath, but for now simply monitor. He is not having any true symptoms.  We will continue to monitor for symptoms of angina, severe CHF or syncope/near syncope.  See atrial fibrillation section, if he has any potential surgeries that he would need to have, my recommendation would he have the surgery is done prior to his valve getting severe.  Otherwise we will have to wait until after potential TAVR.      Relevant Medications   apixaban (ELIQUIS) 5 MG TABS tablet   carvedilol (COREG) 25 MG tablet   Other Relevant Orders   ECHOCARDIOGRAM COMPLETE   Postoperative atrial fibrillation ([place]):  CHA2DS2-VASc Score 6 (Chronic)    I am not really sure if he is truly had recurrence of A. fib.  He had postop A. fib after his knee surgery that was converted with amiodarone.  He was started on  anticoagulation.  Plan: Convert to Eliquis to be covered by [place]. He is on carvedilol 12 and half twice daily which I will increase to 25 mg twice daily.  His A. fib should not be a concern for doing any type of noncardiac surgery.  As part of his preop evaluation when I would recommend his starting him prophylactically on amiodarone preop and continue peri-op. The reason for him not being on tolerate A. fib is because the aortic valve stenosis which is still not severe.  Once it becomes severe then there is more concern about preoperative risk.  My recommendation would be to proceed with any potential surgery prior to his valve progressing to severe.      Relevant Medications   apixaban (ELIQUIS) 5 MG TABS tablet   carvedilol (COREG) 25 MG tablet   Other Relevant Orders   ECHOCARDIOGRAM COMPLETE   Preop cardiovascular exam (Chronic)    Again, [name] had perioperative A. fib, but as far as I can tell has not had any further episodes.  His A. fib converted with amiodarone.  He has moderate to severe aortic stenosis but no significant heart failure.  Mild edema but no other symptoms. No CAD based on cath.  I do not think his A. fib or his valve should preclude him from having surgery. Musculoskeletal surgery such as shoulder or hand shoulder surgery is low risk. He does have a history of stroke and diabetes (although not on insulin).  He has normal renal function and no coronary disease.  He may have some CHF which would give him one risk factor on the Revised Cardiac Risk Index --> this will still make him INTERMEDIATERISK-ALL I PATIENT FOR LOW RISK SURGERY.-Although no additional cardiac evaluation would be necessary.  Being on high-dose beta-blocker reduces this to INTERMEDIATE (3-6%) (The cardiac risk factors are prior stroke and diabetes as opposed to active cardiac symptoms, and there is nothing we can do to change this.)  The best time for doing surgery would be now or we would have to  wait till after his valve is treated with TAVR when it could become severe.  If there is concern for atrial fibrillation, my recommendation would would be prophylactic preop amiodarone load for 1 week preop and then continue peri-op. He would be fine to hold his anticoagulant 2 to 3 days preop, especially being on amiodarone.  He did fine with oral surgery last fall.  My recommendation would be to proceed with surgery without further cardiac evaluation.  We will be available for assistance if necessary. Otherwise, I do not know how well he is to do with his right arm not being functional now.      Relevant Orders   ECHOCARDIOGRAM COMPLETE     As is usually the case with [name], he has a very complicated medical history with multiple complaints.  We discussed his arthritis in clinic concerns and concerns about surgery.  We discussed his dental issues.  We discussed A. fib and his valve.  We reviewed his echocardiogram results.  He had several questions that were answered.  I spent close to 45 minutes with he and his wife today.  Close to 90% of this was with direct patient counseling for issues noted above.   Current medicines are reviewed at length with the patient today.  (+/- concerns) n/a The following changes have been made:  see below  Patient Instructions  Medication Instructions: ASK  V.A. OR INSURANCE - WHAT IS ON YOUR FORMULARY  REPLACEMENT FOR AVAPRO   AND NOTIFY OFFICE  STOP XARELTO AND THEN START THE BELOW  START TAKING  ELIQUIS 5 MG  TWICE A DAY   INCREASE TAKING CARVEDILOL 25 MG TWICE A DAY ,  UNTIL YOU GET THE NEW PRESCRIPTION FILLED  TAKE 1 AND 1/2 TABLETS TWICE A DAY THEN START  25 MG TWICE A DAY  If you need a refill on your cardiac medications before your next appointment, please call your pharmacy.   Lab work: NOT NEEDED If you have labs (blood work) drawn today and your tests are completely normal, you will receive your results only by: [name] MyChart Message (if you  have MyChart) OR . A paper copy in the mail If you have any lab test that is abnormal or we need to change your treatment, we will call you to review the results.  Testing/Procedures:  SCHEDULE IN MARCH 2020 AT [place] Your physician has requested that you have an echocardiogram. Echocardiography is a painless test that uses sound waves to create images of your heart. It provides your doctor with information about the size and shape of your heart and how well your heart's chambers and valves are working. This procedure takes approximately one hour. There are no restrictions for this procedure.   Follow-Up: At [facility], you and your health needs are our priority.  As part of our continuing mission to provide you with exceptional heart care, we have created designated Provider Care Teams.  These Care Teams include your primary Cardiologist (physician) and Advanced Practice Providers (APPs -  Physician Assistants and Nurse Practitioners) who all work together to provide you with the care you need, when you need it. You will need a follow up appointment in 4 months.  Please call our office 2 months in advance to schedule this appointment.  You may see [name], MD or one of the following Advanced Practice Providers on your designated Care Team:   Rosaria Ferries, PA-C . Jory Sims, DNP, ANP  Any Other Special Instructions Will Be Listed Below (If Applicable).    Studies Ordered:   Orders Placed This Encounter  Procedures  . ECHOCARDIOGRAM COMPLETE      [name], M.D., M.S. Interventional Cardiologist   Pager # [phone] Phone # [phone] [address] [place], [place] [address]   Thank you for choosing Heartcare at [facility]!!

## 2018-06-16 LAB — LIPID PANEL
CHOLESTEROL TOTAL: 158 mg/dL (ref 100–199)
Chol/HDL Ratio: 3.4 ratio (ref 0.0–5.0)
HDL: 47 mg/dL (ref >39)
LDL CALC: 76 mg/dL (ref 0–99)
TRIGLYCERIDES: 174 mg/dL — AB (ref 0–149)
VLDL Cholesterol Cal: 35 mg/dL (ref 5–40)

## 2018-06-17 ENCOUNTER — Telehealth: Payer: Self-pay | Admitting: *Deleted

## 2018-06-17 NOTE — Telephone Encounter (Signed)
-----   Message from Leonie Man, MD sent at 06/16/2018  7:09 PM EST ----- Cholesterol panel looks great.  Total cholesterol down to 158.  Even triglycerides are pretty close to normal at 174.  HDL is great at 47 and LDL is back to what it was 5 years ago down to 76.  Continue current regimen.  [name], MD

## 2018-06-17 NOTE — Telephone Encounter (Signed)
Per dpi - left detailed message about lab results also released to mychart. Any question may call back  routed result to primary

## 2018-06-19 ENCOUNTER — Encounter: Payer: Self-pay | Admitting: Cardiology

## 2018-06-19 NOTE — Assessment & Plan Note (Signed)
I am not really sure if he is truly had recurrence of A. fib.  He had postop A. fib after his knee surgery that was converted with amiodarone.  He was started on anticoagulation.  Plan: Convert to Eliquis to be covered by [place]. He is on carvedilol 12 and half twice daily which I will increase to 25 mg twice daily.  His A. fib should not be a concern for doing any type of noncardiac surgery.  As part of his preop evaluation when I would recommend his starting him prophylactically on amiodarone preop and continue peri-op. The reason for him not being on tolerate A. fib is because the aortic valve stenosis which is still not severe.  Once it becomes severe then there is more concern about preoperative risk.  My recommendation would be to proceed with any potential surgery prior to his valve progressing to severe.

## 2018-06-19 NOTE — Assessment & Plan Note (Signed)
Is due for follow-up carotid Dopplers next month.  Continue blood pressure control and statin.

## 2018-06-19 NOTE — Assessment & Plan Note (Signed)
He has had labile blood pressures in the past, but now is quite high.  He is on irbesartan 300 mg which we will plan to switch to a different ARB once we find out what is on the formulary.  Plan: Increase carvedilol to 25 mg twice daily. He will call back with formulary ARB from [place].  Based on that, either myself or Pharm.D. will provide feedback as to which ARB to switch to and what dose.

## 2018-06-19 NOTE — Assessment & Plan Note (Addendum)
Again, [name] had perioperative A. fib, but as far as I can tell has not had any further episodes.  His A. fib converted with amiodarone.  He has moderate to severe aortic stenosis but no significant heart failure.  Mild edema but no other symptoms. No CAD based on cath.  I do not think his A. fib or his valve should preclude him from having surgery. Musculoskeletal surgery such as shoulder or hand shoulder surgery is low risk. He does have a history of stroke and diabetes (although not on insulin).  He has normal renal function and no coronary disease.  He may have some CHF which would give him one risk factor on the Revised Cardiac Risk Index --> this will still make him INTERMEDIATERISK-ALL I PATIENT FOR LOW RISK SURGERY.-Although no additional cardiac evaluation would be necessary.  Being on high-dose beta-blocker reduces this to INTERMEDIATE (3-6%) (The cardiac risk factors are prior stroke and diabetes as opposed to active cardiac symptoms, and there is nothing we can do to change this.)  The best time for doing surgery would be now or we would have to wait till after his valve is treated with TAVR when it could become severe.  If there is concern for atrial fibrillation, my recommendation would would be prophylactic preop amiodarone load for 1 week preop and then continue peri-op. He would be fine to hold his anticoagulant 2 to 3 days preop, especially being on amiodarone.  He did fine with oral surgery last fall.  My recommendation would be to proceed with surgery without further cardiac evaluation.  We will be available for assistance if necessary. Otherwise, I do not know how well he is to do with his right arm not being functional now.

## 2018-06-19 NOTE — Assessment & Plan Note (Signed)
Progression now to moderate-severe aortic stenosis with a mean gradient of 36 mmHg.  This is pretty rapid progression so we will recheck in 6 months. He would certainly not be a very good candidate for open valve surgery, but would probably be a better TAVR candidate.  At that time would need TEE and preop right and left heart cath, but for now simply monitor. He is not having any true symptoms.  We will continue to monitor for symptoms of angina, severe CHF or syncope/near syncope.  See atrial fibrillation section, if he has any potential surgeries that he would need to have, my recommendation would he have the surgery is done prior to his valve getting severe.  Otherwise we will have to wait until after potential TAVR.

## 2018-06-21 IMAGING — DX DG CHEST 2V
2 series · 2 of 2 positions shown · non-contrast
Comparison: Chest radiograph 02/01/2017

CLINICAL DATA: Patient with low blood pressure. Abdominal
distension.

EXAM:
CHEST  2 VIEW

[chest lat]
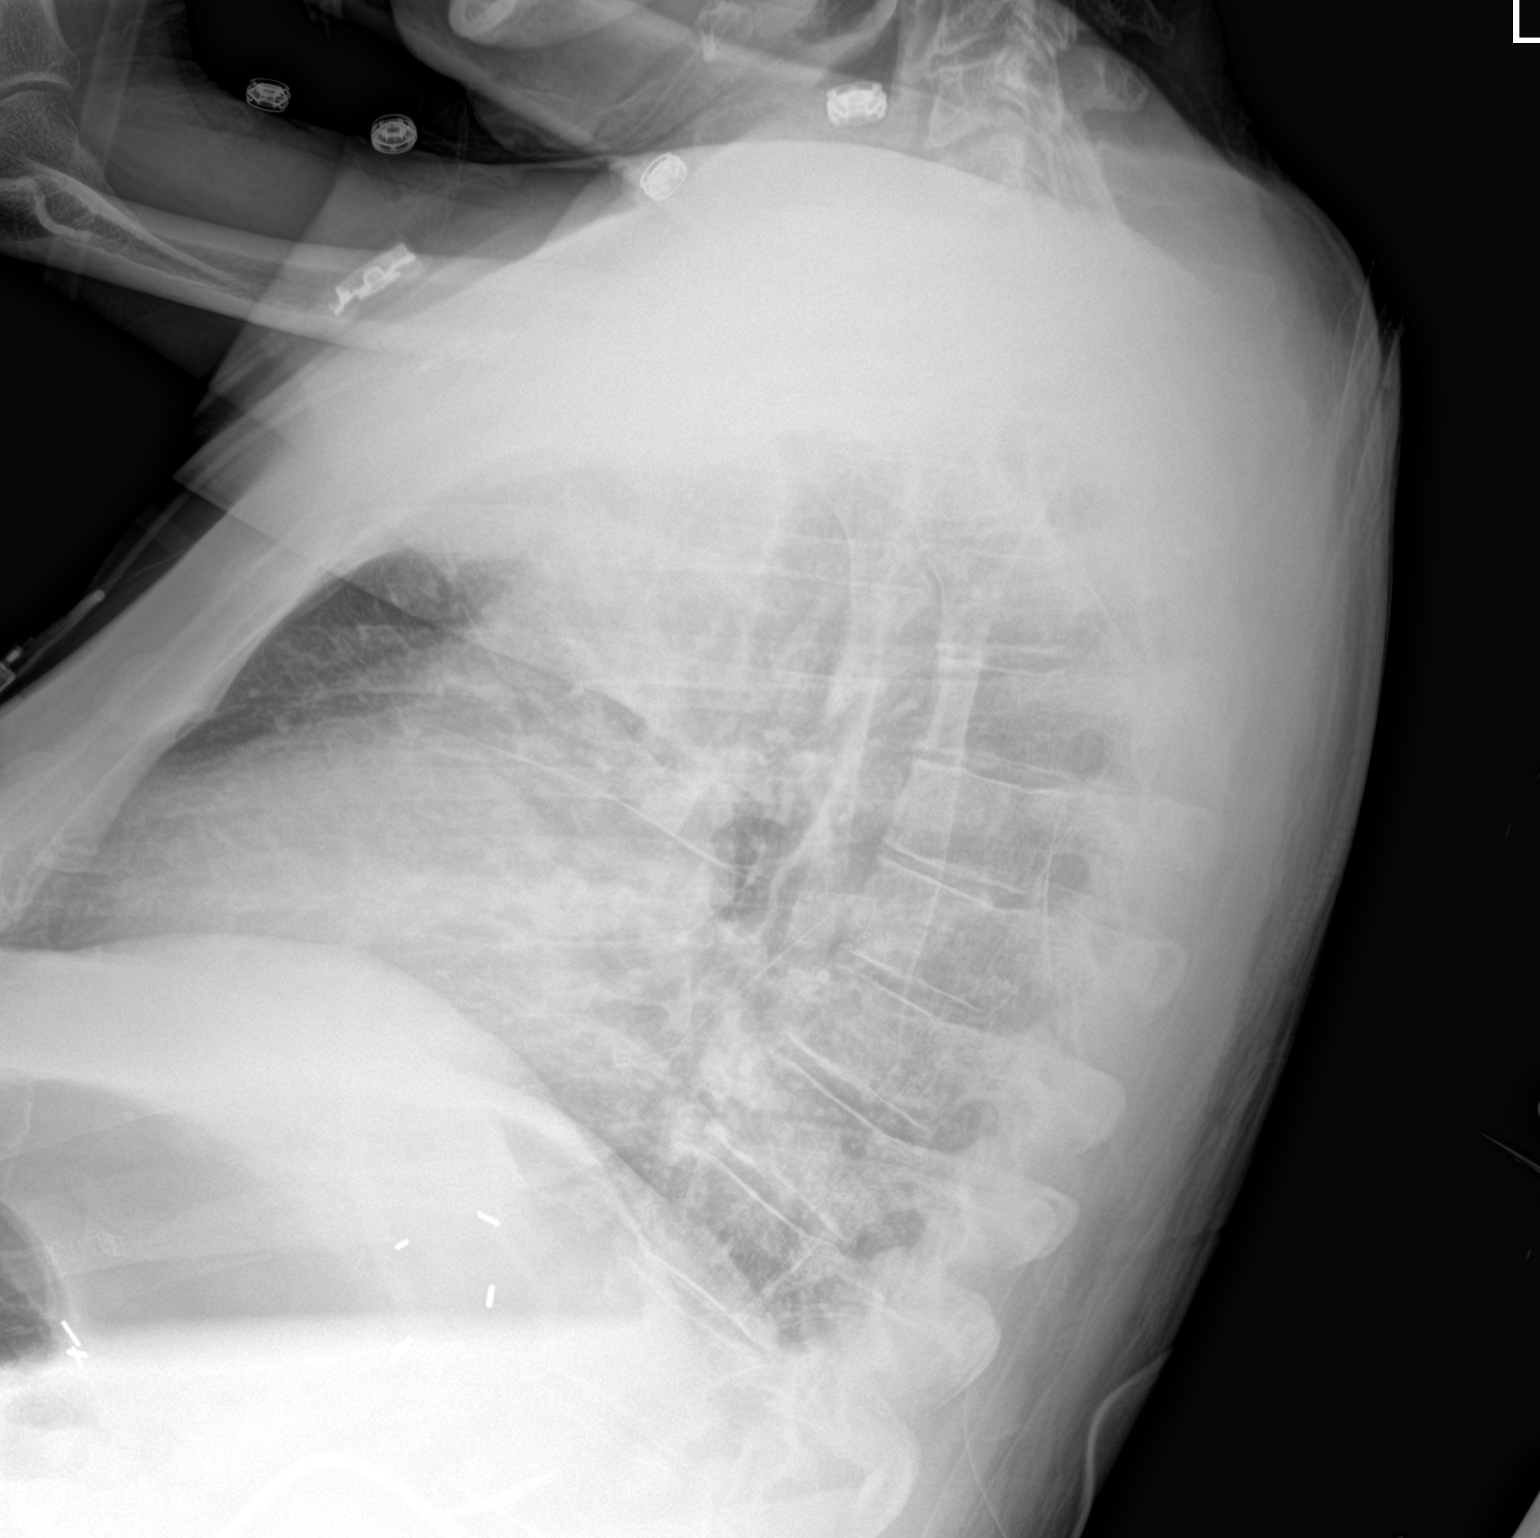

[chest ap]
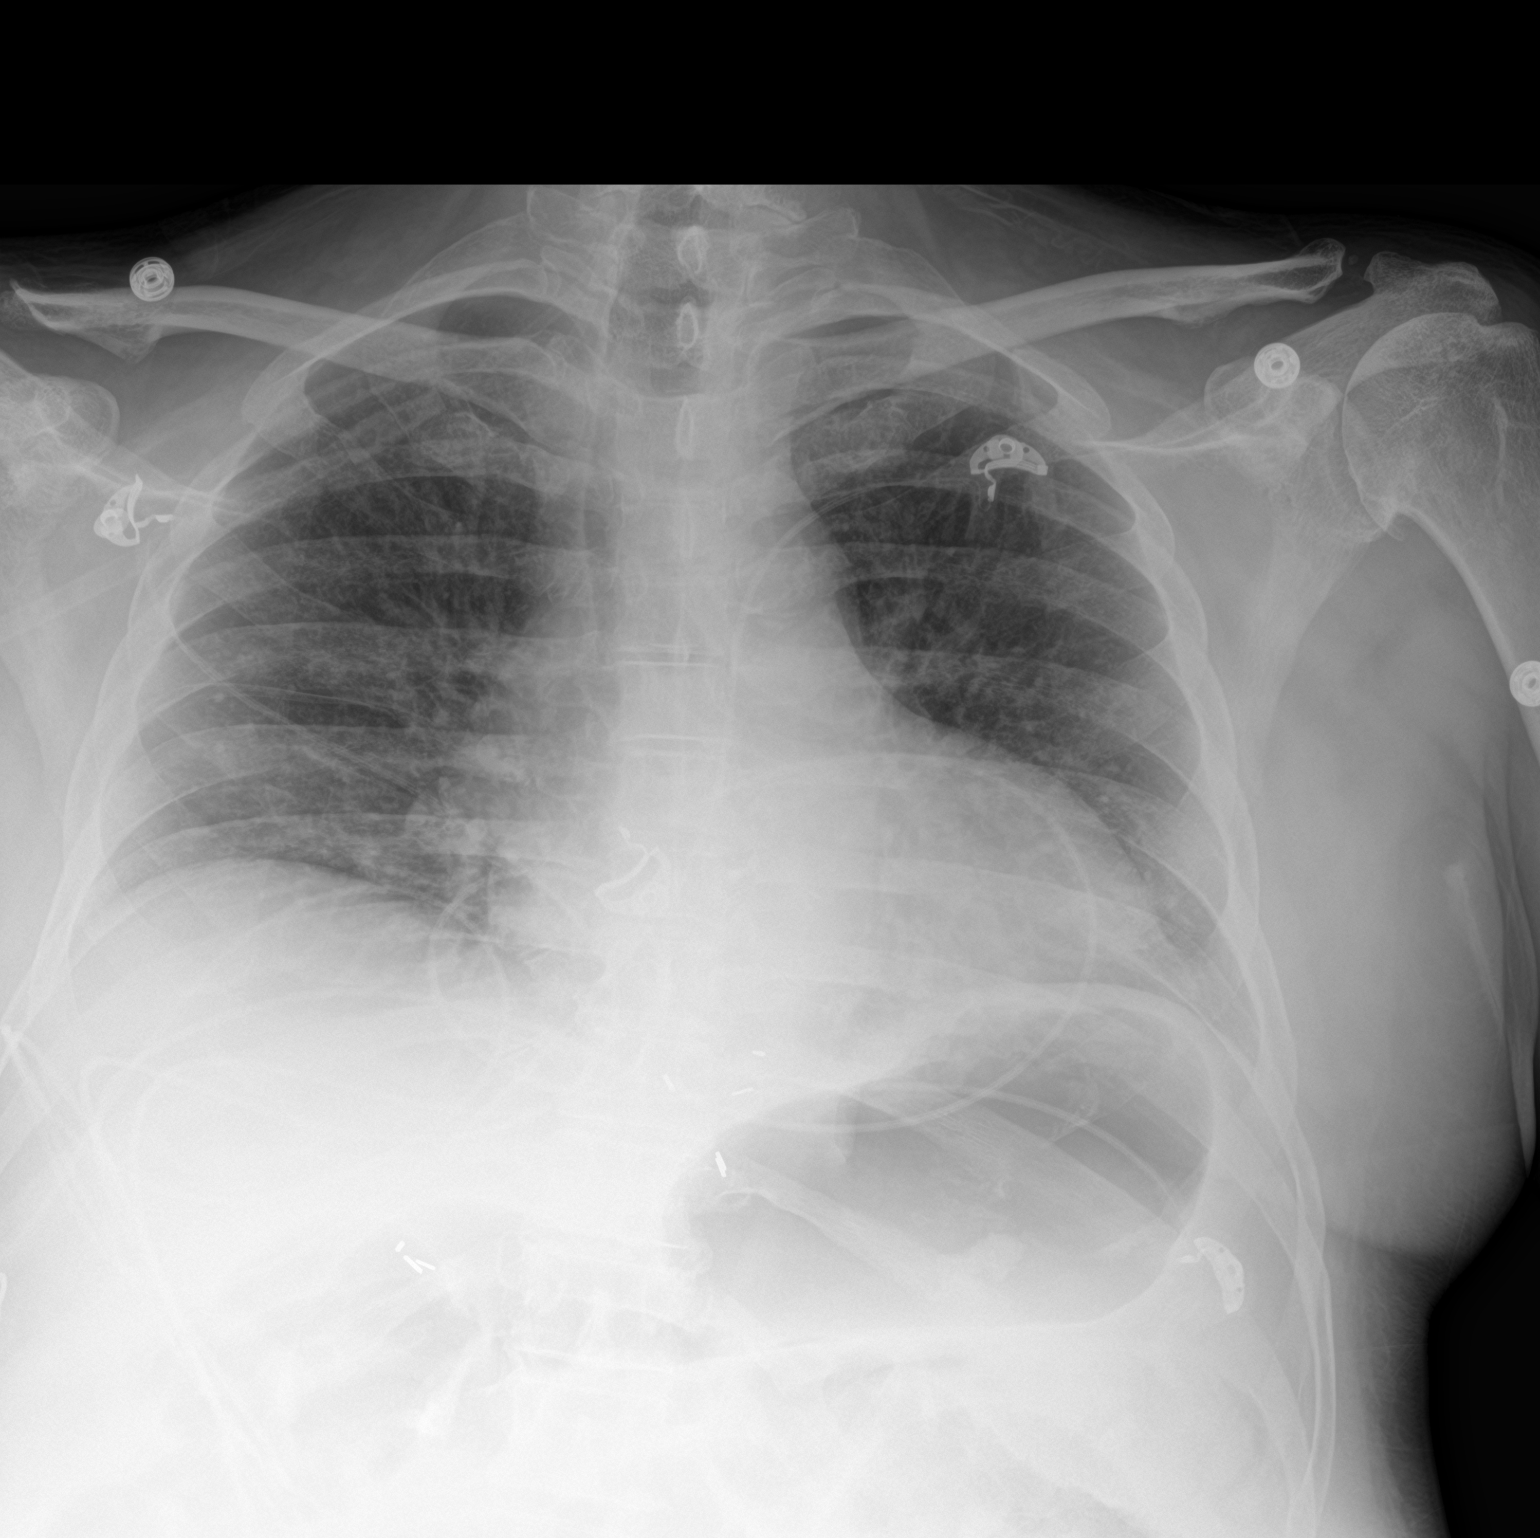

[2 of 2 positions shown; findings below may reference images not displayed]

FINDINGS: Monitoring leads overlie the patient. Stable enlarged cardiac and
mediastinal contours. Low lung volumes. Basilar atelectasis. No
pleural effusion or pneumothorax.
IMPRESSION: Low lung volumes with basilar atelectasis.

## 2018-06-21 IMAGING — DX DG ABDOMEN 2V
3 series · 4 of 4 positions shown · non-contrast
Comparison: Radiographs February 04, 2017.

CLINICAL DATA: Acute abdominal distension.

EXAM:
ABDOMEN - 2 VIEW

[abdomen supine (1 of 2)]
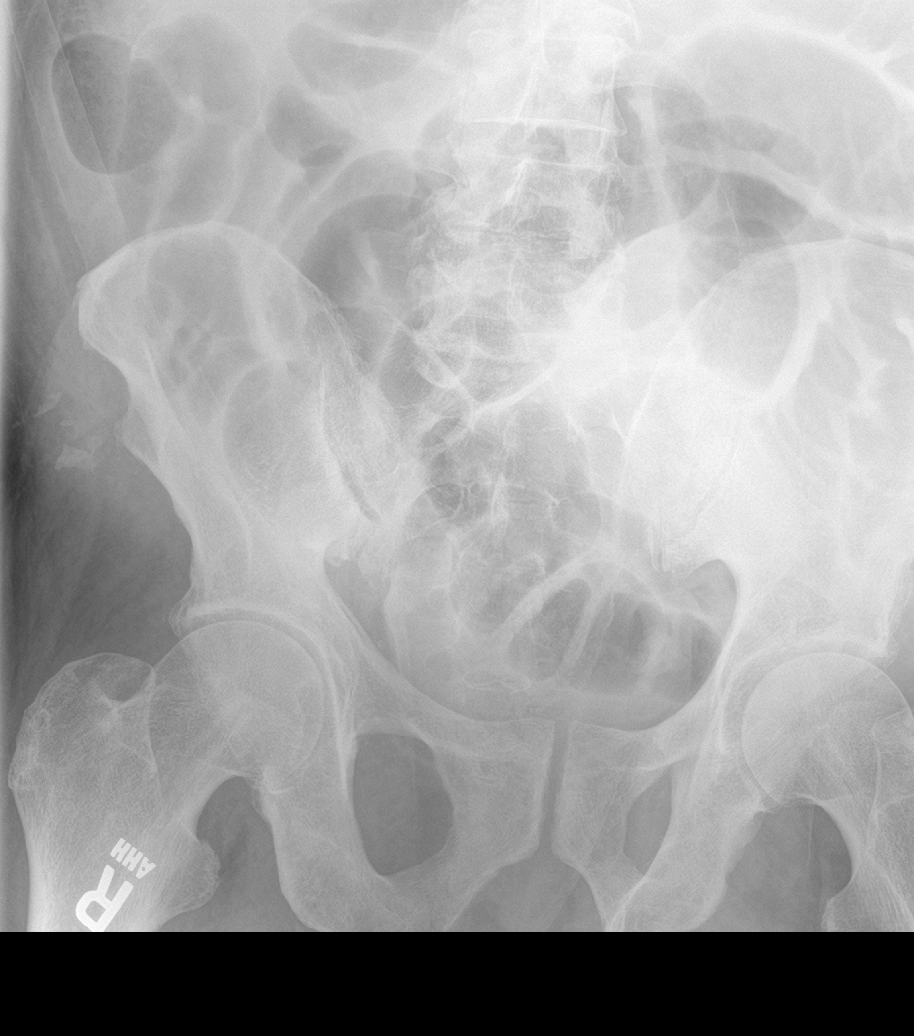

[abdomen supine (2 of 2)]
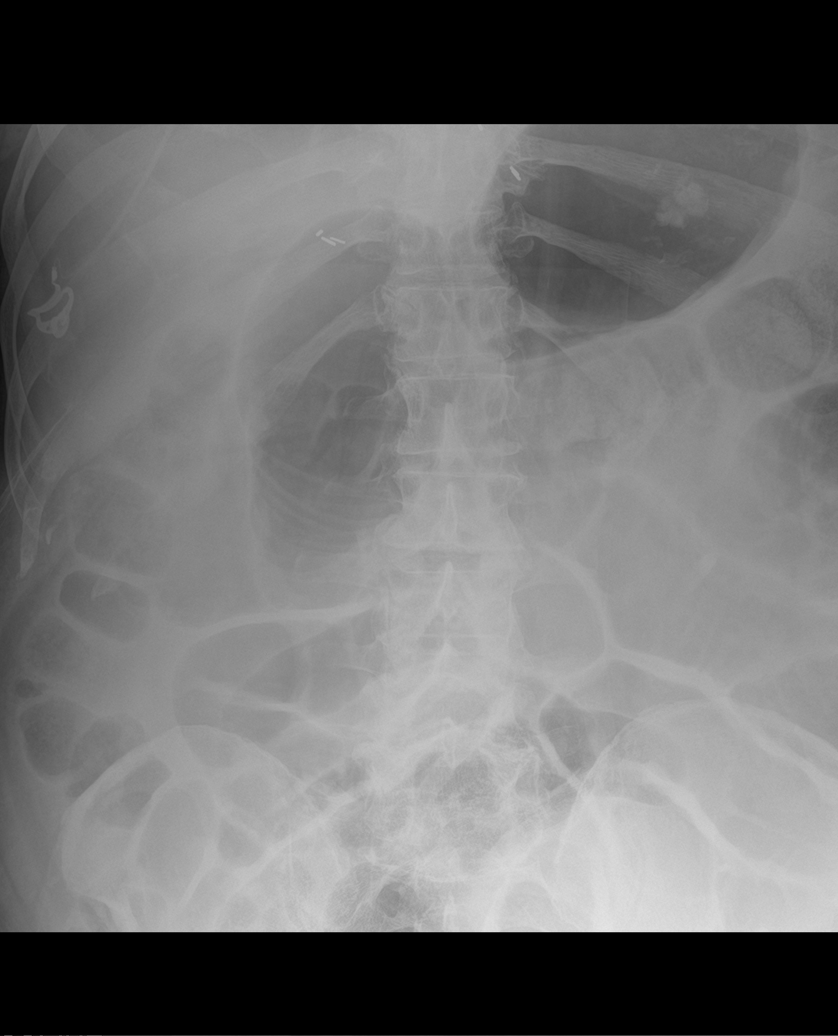

[Series 4: abdomen decu · 0.14mm/px · 2 of 2 slices shown]
[im 1/2]
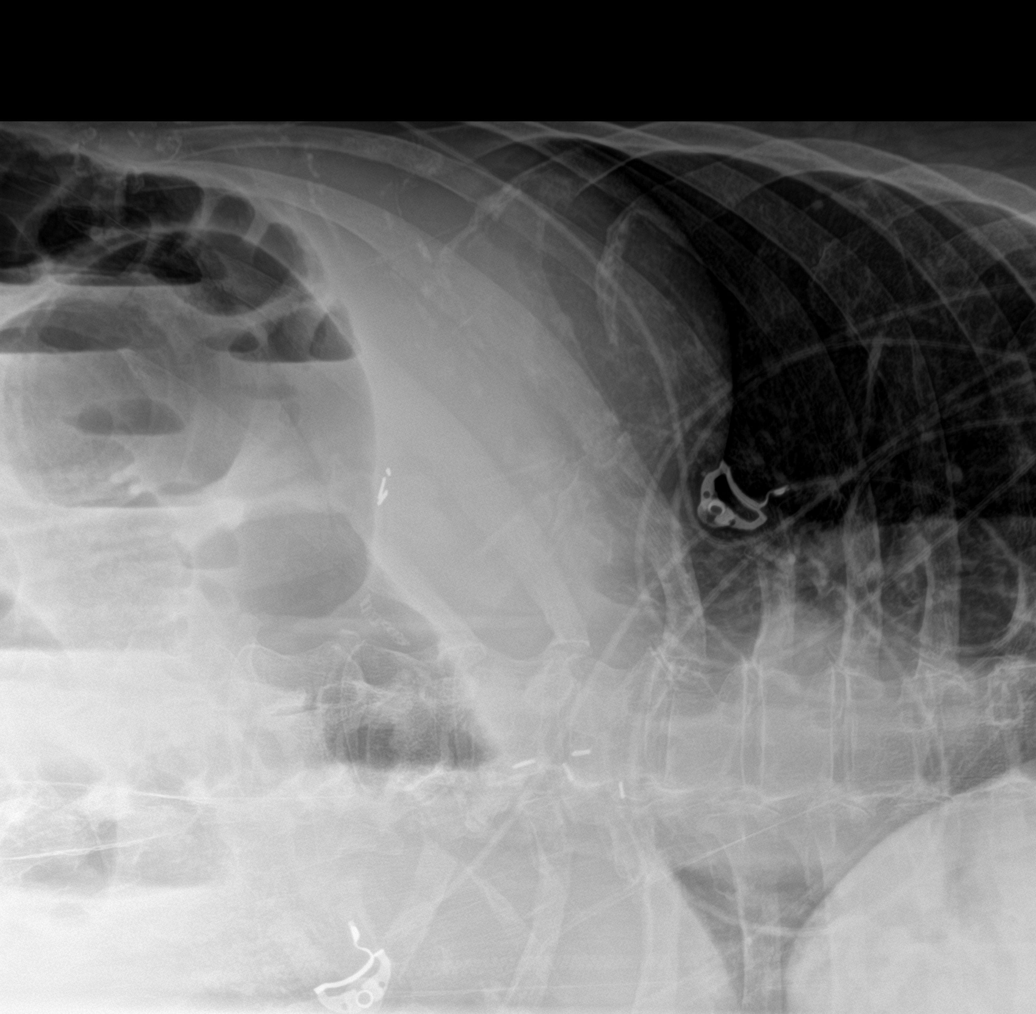
[im 2/2]
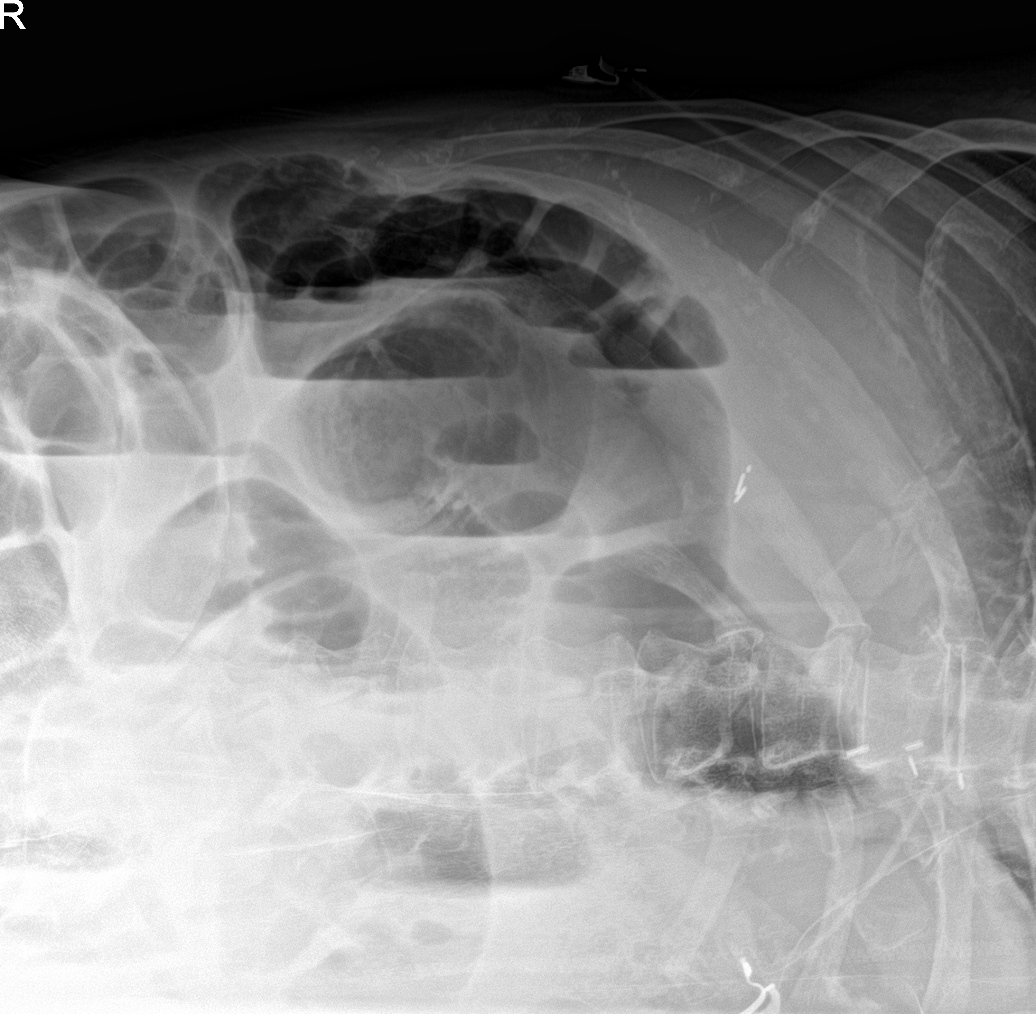

[4 of 4 positions shown; findings below may reference images not displayed]

FINDINGS: Status post cholecystectomy. Moderately dilated small bowel loops
are noted concerning for distal small bowel obstruction. No definite
colonic dilatation is noted. No free air is noted.
IMPRESSION: Mildly dilated small bowel loops are noted concerning for distal
small bowel obstruction. Continued radiographic follow-up is
recommended.

## 2018-06-22 DIAGNOSIS — M25562 Pain in left knee: Secondary | ICD-10-CM | POA: Diagnosis not present

## 2018-06-22 DIAGNOSIS — Z79891 Long term (current) use of opiate analgesic: Secondary | ICD-10-CM | POA: Diagnosis not present

## 2018-06-22 DIAGNOSIS — G894 Chronic pain syndrome: Secondary | ICD-10-CM | POA: Diagnosis not present

## 2018-06-22 DIAGNOSIS — M5136 Other intervertebral disc degeneration, lumbar region: Secondary | ICD-10-CM | POA: Diagnosis not present

## 2018-06-23 DIAGNOSIS — J449 Chronic obstructive pulmonary disease, unspecified: Secondary | ICD-10-CM | POA: Diagnosis not present

## 2018-06-23 DIAGNOSIS — I2510 Atherosclerotic heart disease of native coronary artery without angina pectoris: Secondary | ICD-10-CM | POA: Diagnosis not present

## 2018-07-01 DIAGNOSIS — M25562 Pain in left knee: Secondary | ICD-10-CM | POA: Diagnosis not present

## 2018-07-03 NOTE — Assessment & Plan Note (Signed)
Imaging has not shown bronchiectasis despite his sustained chronic bronchitis, culture positive for GNR's in the past. He is near baseline now. Plan-renew Symbicort

## 2018-07-03 NOTE — Assessment & Plan Note (Signed)
Any nodule as below level of CXR imaging over the last 2 years and likely benign.

## 2018-07-03 NOTE — Assessment & Plan Note (Signed)
Ongoing emphasis on reflux precautions.

## 2018-07-04 ENCOUNTER — Other Ambulatory Visit: Payer: Self-pay | Admitting: Cardiology

## 2018-07-04 MED ORDER — APIXABAN 5 MG PO TABS: 5.0000 mg | ORAL_TABLET | Freq: Two times a day (BID) | ORAL | 1 refills | Status: DC

## 2018-07-04 NOTE — Telephone Encounter (Signed)
°*  STAT* If patient is at the pharmacy, call can be transferred to refill team.   1. Which medications need to be refilled? (please list name of each medication and dose if known) need new prescription and new prescription for Eliquis  2. Which pharmacy/location (including street and city if local pharmacy) is medication to be sent to? [facility] [phone]  3. Do they need a 30 day or 90 day supply? 180 and refills

## 2018-07-04 NOTE — Telephone Encounter (Signed)
[facility] [address] as requested

## 2018-07-06 DIAGNOSIS — J449 Chronic obstructive pulmonary disease, unspecified: Secondary | ICD-10-CM | POA: Diagnosis not present

## 2018-07-06 DIAGNOSIS — Z0001 Encounter for general adult medical examination with abnormal findings: Secondary | ICD-10-CM | POA: Diagnosis not present

## 2018-07-06 DIAGNOSIS — M542 Cervicalgia: Secondary | ICD-10-CM | POA: Diagnosis not present

## 2018-07-06 DIAGNOSIS — J309 Allergic rhinitis, unspecified: Secondary | ICD-10-CM | POA: Diagnosis not present

## 2018-07-06 DIAGNOSIS — R69 Illness, unspecified: Secondary | ICD-10-CM | POA: Diagnosis not present

## 2018-07-06 DIAGNOSIS — F4312 Post-traumatic stress disorder, chronic: Secondary | ICD-10-CM | POA: Diagnosis not present

## 2018-07-18 DIAGNOSIS — M542 Cervicalgia: Secondary | ICD-10-CM | POA: Diagnosis not present

## 2018-07-18 DIAGNOSIS — Z0001 Encounter for general adult medical examination with abnormal findings: Secondary | ICD-10-CM | POA: Diagnosis not present

## 2018-07-18 DIAGNOSIS — J449 Chronic obstructive pulmonary disease, unspecified: Secondary | ICD-10-CM | POA: Diagnosis not present

## 2018-07-18 DIAGNOSIS — R69 Illness, unspecified: Secondary | ICD-10-CM | POA: Diagnosis not present

## 2018-07-22 DIAGNOSIS — M5136 Other intervertebral disc degeneration, lumbar region: Secondary | ICD-10-CM | POA: Diagnosis not present

## 2018-07-22 DIAGNOSIS — Z79891 Long term (current) use of opiate analgesic: Secondary | ICD-10-CM | POA: Diagnosis not present

## 2018-07-22 DIAGNOSIS — G894 Chronic pain syndrome: Secondary | ICD-10-CM | POA: Diagnosis not present

## 2018-07-22 DIAGNOSIS — M25562 Pain in left knee: Secondary | ICD-10-CM | POA: Diagnosis not present

## 2018-07-27 ENCOUNTER — Telehealth: Payer: Self-pay | Admitting: Internal Medicine

## 2018-07-27 MED ORDER — AMOXICILLIN-POT CLAVULANATE 875-125 MG PO TABS: 1.0000 | ORAL_TABLET | Freq: Two times a day (BID) | ORAL | 1 refills | Status: DC

## 2018-07-27 NOTE — Telephone Encounter (Signed)
Call made to patient, made aware of CY recommendations. Confirmed pharmacy, order sent in. Voiced understanding. Nothing further is needed at this time.

## 2018-07-27 NOTE — Telephone Encounter (Signed)
Call made to patient, patient states he feels like he has bronchitis again. He reports cough with yellow and green sputum, fever of  100, he has been taking tylenol and his fever has come down to 99. He is taking Mucinex to help with his congestion. He states his wife check his oxygen during the night and he desaturated to 71%. He put on 2L and his oxygen increased. Patient is requesting something be called into his pharmacy.   Allergies:  Bee Venom  Anaphylaxis High  09/03/2016   Codeine  Shortness Of Breath, Rash High  01/21/2007   Doxycycline  Shortness Of Breath, Rash High  07/22/2007   Ivp Dye [Iodinated Diagnostic Agents]  Shortness Of Breath High Allergy 03/12/2011   Lost consciousness/difficulty breathing Omni - Paque Contrast     Levaquin [Levofloxacin]  Hives, Shortness Of Breath High  04/08/2015   Oxycodone Hcl  Shortness Of Breath, Swelling, Rash High Allergy 07/22/2007   Stadol [Butorphanol]  Shortness Of Breath High  01/18/2017   Succinylcholine Chloride  Shortness Of Breath, Nausea Only, Rash, Other (See Comments) High  04/07/2011   Difficulty breathing    Atorvastatin   Not Specified  01/16/2008   myalgias    Iohexol   Not Specified  08/19/2004   Desc: hives,neck and torso erythemia    Methocarbamol  Nausea Only Not Specified  03/12/2011   rash    Pentazocine Lactate   Not Specified  03/12/2011   rash    Tamiflu [Oseltamivir Phosphate]   Not Specified  11/02/2017   Dilaudid [Hydromorphone Hcl]  Nausea Only, Rash Low  03/12/2011   Aggressive    Synvisc [Hylan G-f 20]  Hives, Rash Low  03/12/2011   Adverse Reactions/Drug Intolerances  Bystolic [Nebivolol Hcl]         CY please advise. Thanks.

## 2018-07-27 NOTE — Telephone Encounter (Signed)
Offer augmentin 875 mg, # 14, 1 twice daily, ref x 1

## 2018-08-01 ENCOUNTER — Telehealth: Payer: Self-pay | Admitting: Internal Medicine

## 2018-08-01 MED ORDER — AMOXICILLIN-POT CLAVULANATE 875-125 MG PO TABS: 1.0000 | ORAL_TABLET | Freq: Two times a day (BID) | ORAL | 1 refills | Status: AC

## 2018-08-01 NOTE — Telephone Encounter (Signed)
Called and spoke with patient.  Patient stated that he recently had a Ct head and neck, 07/21/18, at the [place].  He stated that it showed mild and moderate frontal extensive paranasal sinus disease. He stated that he went to the doctor on 07/26/18, and was told he had bronchitis.  He was prescribed Augmentin 875mg , 1 BID for 7 days.  Patient stated that he has right side sinus pressure.  He is concerned that he may need something else, or Dr Annamaria Boots may have other recommendations.  Patient stated that his productive cough has improved and his phlegm has went from green, to milky yellow color.    Message routed to Dr Annamaria Boots  Allergies  Allergen Reactions  . Bee Venom Anaphylaxis  . Codeine Shortness Of Breath and Rash  . Doxycycline Shortness Of Breath and Rash  . Ivp Dye [Iodinated Diagnostic Agents] Shortness Of Breath    Lost consciousness/difficulty breathing Omni - Paque Contrast   . Levaquin [Levofloxacin] Hives and Shortness Of Breath  . Oxycodone Hcl Shortness Of Breath, Swelling and Rash  . Stadol [Butorphanol] Shortness Of Breath  . Succinylcholine Chloride Shortness Of Breath, Nausea Only, Rash and Other (See Comments)    Difficulty breathing   . Atorvastatin     myalgias  . Bystolic [Nebivolol Hcl] Other (See Comments)    Nightmares, flashbacks (PTSD)  . Iohexol      Desc: hives,neck and torso erythemia   . Methocarbamol Nausea Only    rash  . Pentazocine Lactate     rash  . Tamiflu [Oseltamivir Phosphate]   . Dilaudid [Hydromorphone Hcl] Nausea Only and Rash    Aggressive   . Synvisc [Hylan G-F 20] Hives and Rash   Current Outpatient Medications on File Prior to Visit  Medication Sig Dispense Refill  . albuterol (ACCUNEB) 0.63 MG/3ML nebulizer solution Take 1 ampule by nebulization every 6 (six) hours as needed for wheezing or shortness of breath.     . Albuterol Sulfate 108 (90 Base) MCG/ACT AEPB Inhale into the lungs.    . ALPRAZolam (XANAX) 1 MG tablet Take 1 tablet (1  mg total) by mouth 3 (three) times daily as needed for anxiety or sleep. (Patient taking differently: Take 1-2 mg by mouth every 8 (eight) hours as needed (for anxiety/PTSD). ) 90 tablet 0  . amoxicillin-clavulanate (AUGMENTIN) 875-125 MG tablet Take 1 tablet by mouth 2 (two) times daily for 7 days. 14 tablet 1  . apixaban (ELIQUIS) 5 MG TABS tablet Take 1 tablet (5 mg total) by mouth 2 (two) times daily. 180 tablet 1  . beclomethasone (BECONASE-AQ) 42 MCG/SPRAY nasal spray Place 1 spray into both nostrils daily. Dose is for each nostril.    . budesonide-formoterol (SYMBICORT) 160-4.5 MCG/ACT inhaler INHALE 2 PUFFS BY MOUTH TWICE DAILY. RINSE MOUTH WELL 30.6 g 3  . carvedilol (COREG) 25 MG tablet Take 1 tablet (25 mg total) by mouth 2 (two) times daily. 180 tablet 3  . furosemide (LASIX) 20 MG tablet Take 1 tablet (20 mg total) by mouth daily. MAY TAKE ADDITIONAL TABLET AS NEEDED SWELLING OR SHORTNESS OF BREATH 180 tablet 3  . irbesartan (AVAPRO) 300 MG tablet Take 1 tablet (300 mg total) by mouth daily. 90 tablet 3  . morphine (MSIR) 15 MG tablet TK 1 T PO Q 4 H PRN P    . Naldemedine Tosylate 0.2 MG TABS Take 0.2 mg by mouth as needed.    . nitroGLYCERIN (NITROSTAT) 0.4 MG SL tablet Place 1 tablet (  0.4 mg total) under the tongue every 5 (five) minutes as needed for chest pain. 25 tablet 2  . polyethylene glycol (MIRALAX / GLYCOLAX) packet Take 17 g by mouth 2 (two) times daily as needed for moderate constipation.     [name] Respiratory Therapy Supplies (FLUTTER) DEVI Use as directed 1 each 0  . rosuvastatin (CRESTOR) 10 MG tablet Take 1 tablet (10 mg total) by mouth daily. 90 tablet 3   No current facility-administered medications on file prior to visit.

## 2018-08-01 NOTE — Telephone Encounter (Signed)
Suggest extending the augmentin another 7 days  875 mg, # 14, 1 twice daily  Suggest using an otc saline sinus rinse once daily- like NeilMed, etc.

## 2018-08-01 NOTE — Telephone Encounter (Signed)
Called and spoke to patient I have sent in script and he asked about his wife being sick I have sent in another encounter for her to dr. Annamaria Boots nothing further is needed at this time.

## 2018-08-15 ENCOUNTER — Ambulatory Visit (HOSPITAL_COMMUNITY): Payer: Medicare HMO

## 2018-08-15 ENCOUNTER — Other Ambulatory Visit (HOSPITAL_COMMUNITY): Payer: Medicare HMO

## 2018-08-16 DIAGNOSIS — J441 Chronic obstructive pulmonary disease with (acute) exacerbation: Secondary | ICD-10-CM | POA: Diagnosis not present

## 2018-08-16 DIAGNOSIS — J349 Unspecified disorder of nose and nasal sinuses: Secondary | ICD-10-CM | POA: Diagnosis not present

## 2018-08-16 DIAGNOSIS — Z0001 Encounter for general adult medical examination with abnormal findings: Secondary | ICD-10-CM | POA: Diagnosis not present

## 2018-08-16 DIAGNOSIS — R69 Illness, unspecified: Secondary | ICD-10-CM | POA: Diagnosis not present

## 2018-08-16 DIAGNOSIS — I779 Disorder of arteries and arterioles, unspecified: Secondary | ICD-10-CM | POA: Diagnosis not present

## 2018-08-16 DIAGNOSIS — M542 Cervicalgia: Secondary | ICD-10-CM | POA: Diagnosis not present

## 2018-08-16 DIAGNOSIS — G319 Degenerative disease of nervous system, unspecified: Secondary | ICD-10-CM | POA: Diagnosis not present

## 2018-08-19 DIAGNOSIS — M25562 Pain in left knee: Secondary | ICD-10-CM | POA: Diagnosis not present

## 2018-08-19 DIAGNOSIS — Z79891 Long term (current) use of opiate analgesic: Secondary | ICD-10-CM | POA: Diagnosis not present

## 2018-08-19 DIAGNOSIS — G894 Chronic pain syndrome: Secondary | ICD-10-CM | POA: Diagnosis not present

## 2018-08-19 DIAGNOSIS — M5136 Other intervertebral disc degeneration, lumbar region: Secondary | ICD-10-CM | POA: Diagnosis not present

## 2018-09-16 DIAGNOSIS — M542 Cervicalgia: Secondary | ICD-10-CM | POA: Diagnosis not present

## 2018-09-16 DIAGNOSIS — J309 Allergic rhinitis, unspecified: Secondary | ICD-10-CM | POA: Diagnosis not present

## 2018-09-16 DIAGNOSIS — R69 Illness, unspecified: Secondary | ICD-10-CM | POA: Diagnosis not present

## 2018-09-16 DIAGNOSIS — F4312 Post-traumatic stress disorder, chronic: Secondary | ICD-10-CM | POA: Diagnosis not present

## 2018-09-20 DIAGNOSIS — M25562 Pain in left knee: Secondary | ICD-10-CM | POA: Diagnosis not present

## 2018-09-20 DIAGNOSIS — Z79891 Long term (current) use of opiate analgesic: Secondary | ICD-10-CM | POA: Diagnosis not present

## 2018-09-20 DIAGNOSIS — G894 Chronic pain syndrome: Secondary | ICD-10-CM | POA: Diagnosis not present

## 2018-09-20 DIAGNOSIS — M5136 Other intervertebral disc degeneration, lumbar region: Secondary | ICD-10-CM | POA: Diagnosis not present

## 2018-09-30 ENCOUNTER — Telehealth: Payer: Self-pay | Admitting: Internal Medicine

## 2018-09-30 DIAGNOSIS — J441 Chronic obstructive pulmonary disease with (acute) exacerbation: Secondary | ICD-10-CM

## 2018-09-30 MED ORDER — BUDESONIDE-FORMOTEROL FUMARATE 160-4.5 MCG/ACT IN AERO: INHALATION_SPRAY | RESPIRATORY_TRACT | 4 refills | Status: DC

## 2018-09-30 NOTE — Telephone Encounter (Signed)
Called and spoke with Patient.  Patient stated his bronchitis is acting up with the increased pollen.  Patient stated he has a productive cough, with thick green phlegm.  Patient has started wheezing and having some SHOB, that started last night.  Patient stated he is sleeping with his head elevated. Patient denies fever (97.1 when checked last), or loss of smell.  Patient stated he has been staying at home.  Patient has used his Symbicort as prescribed, and is using nebs twice a day.  Patient stated his wife has amoxicillin prescription that she never used, and wanted to know if Dr Annamaria Boots would like him to try that.  Patient stated last time he was prescribed a Z pack. Patient [facility] [address]. Patient also would like a written prescription for Symbicort to take to his next [place] visit, to see if he can get it cheaper at the [place].  Message routed to Dr Annamaria Boots  Allergies  Allergen Reactions  . Bee Venom Anaphylaxis  . Codeine Shortness Of Breath and Rash  . Doxycycline Shortness Of Breath and Rash  . Ivp Dye [Iodinated Diagnostic Agents] Shortness Of Breath    Lost consciousness/difficulty breathing Omni - Paque Contrast   . Levaquin [Levofloxacin] Hives and Shortness Of Breath  . Oxycodone Hcl Shortness Of Breath, Swelling and Rash  . Stadol [Butorphanol] Shortness Of Breath  . Succinylcholine Chloride Shortness Of Breath, Nausea Only, Rash and Other (See Comments)    Difficulty breathing   . Atorvastatin     myalgias  . Bystolic [Nebivolol Hcl] Other (See Comments)    Nightmares, flashbacks (PTSD)  . Iohexol      Desc: hives,neck and torso erythemia   . Methocarbamol Nausea Only    rash  . Pentazocine Lactate     rash  . Tamiflu [Oseltamivir Phosphate]   . Dilaudid [Hydromorphone Hcl] Nausea Only and Rash    Aggressive   . Synvisc [Hylan G-F 20] Hives and Rash   Current Outpatient Medications on File Prior to Visit  Medication Sig Dispense  Refill  . albuterol (ACCUNEB) 0.63 MG/3ML nebulizer solution Take 1 ampule by nebulization every 6 (six) hours as needed for wheezing or shortness of breath.     . Albuterol Sulfate 108 (90 Base) MCG/ACT AEPB Inhale into the lungs.    . ALPRAZolam (XANAX) 1 MG tablet Take 1 tablet (1 mg total) by mouth 3 (three) times daily as needed for anxiety or sleep. (Patient taking differently: Take 1-2 mg by mouth every 8 (eight) hours as needed (for anxiety/PTSD). ) 90 tablet 0  . apixaban (ELIQUIS) 5 MG TABS tablet Take 1 tablet (5 mg total) by mouth 2 (two) times daily. 180 tablet 1  . beclomethasone (BECONASE-AQ) 42 MCG/SPRAY nasal spray Place 1 spray into both nostrils daily. Dose is for each nostril.    . budesonide-formoterol (SYMBICORT) 160-4.5 MCG/ACT inhaler INHALE 2 PUFFS BY MOUTH TWICE DAILY. RINSE MOUTH WELL 30.6 g 3  . carvedilol (COREG) 25 MG tablet Take 1 tablet (25 mg total) by mouth 2 (two) times daily. 180 tablet 3  . furosemide (LASIX) 20 MG tablet Take 1 tablet (20 mg total) by mouth daily. MAY TAKE ADDITIONAL TABLET AS NEEDED SWELLING OR SHORTNESS OF BREATH 180 tablet 3  . irbesartan (AVAPRO) 300 MG tablet Take 1 tablet (300 mg total) by mouth daily. 90 tablet 3  . morphine (MSIR) 15 MG tablet TK 1 T PO Q 4 H PRN P    .  Naldemedine Tosylate 0.2 MG TABS Take 0.2 mg by mouth as needed.    . nitroGLYCERIN (NITROSTAT) 0.4 MG SL tablet Place 1 tablet (0.4 mg total) under the tongue every 5 (five) minutes as needed for chest pain. 25 tablet 2  . polyethylene glycol (MIRALAX / GLYCOLAX) packet Take 17 g by mouth 2 (two) times daily as needed for moderate constipation.     [name] Respiratory Therapy Supplies (FLUTTER) DEVI Use as directed 1 each 0  . rosuvastatin (CRESTOR) 10 MG tablet Take 1 tablet (10 mg total) by mouth daily. 90 tablet 3   No current facility-administered medications on file prior to visit.

## 2018-09-30 NOTE — Telephone Encounter (Signed)
Called and spoke with Patient.  Dr Annamaria Boots recommendations given.  Understanding stated.  Printed Symbicort prescription printed and signed by Dr Annamaria Boots.  Patient requested prescription to be mailed to him, due to Covid 19 stay at home order.  Address confirmed. Prescription placed in sealed envelope in out going mail. Nothing further at this time.

## 2018-09-30 NOTE — Telephone Encounter (Signed)
Since his wife's script for amoxacillin is already paid for and filled, if he wants to use that it would be ok.  Ok to print Rx for his Symbicort 160, # 3,   Inhale 2 puffs then rinse mouth, twice daily,   Refill x 4

## 2018-09-30 NOTE — Telephone Encounter (Signed)
Can also be reached at [phone].

## 2018-10-10 ENCOUNTER — Other Ambulatory Visit: Payer: Self-pay | Admitting: Cardiology

## 2018-10-10 NOTE — Telephone Encounter (Signed)
Rosuvastatin 10 mg refilled.

## 2018-10-11 ENCOUNTER — Telehealth: Payer: Self-pay | Admitting: *Deleted

## 2018-10-11 NOTE — Telephone Encounter (Signed)
   Primary Cardiologist: [name], MD   LEFT MESSAGE FOR PATIENT.    Pt will f/u with [facility] provider as scheduled.  Pt. advised that we are restricting visitors at this time and request that only patients present for check-in prior to their appointment and  to wear face covering.  All other visitors should remain in their car.  If necessary, only one visitor may come with the patient, into the building. For everyone's safety, all patients and visitor entering our practice area should expect to be screened again prior to entering our waiting area. APPT 5/18 Raiford Simmonds, RN  10/11/2018 5:30 PM

## 2018-10-12 ENCOUNTER — Telehealth: Payer: Self-pay | Admitting: *Deleted

## 2018-10-12 ENCOUNTER — Telehealth: Payer: Self-pay | Admitting: Internal Medicine

## 2018-10-12 MED ORDER — PREDNISONE 10 MG PO TABS: ORAL_TABLET | ORAL | 0 refills | Status: DC

## 2018-10-12 NOTE — Telephone Encounter (Signed)
   Primary Cardiologist:  [name], MD   Patient contacted. Patient would like to postpone appointment - due to continuous bronchitis and has not had testing completed echo or carotid.  History reviewed.  No symptoms to suggest any unstable cardiac conditions.  Based on discussion, with current pandemic situation, we will be postponing this appointment for [name] with a plan for f/u in July 16,2020 at 1:20 pm or sooner if feasible/necessary.  If symptoms change, he has been instructed to contact our office.     Raiford Simmonds, RN  10/12/2018 11:40 AM         .

## 2018-10-12 NOTE — Telephone Encounter (Signed)
Called and spoke with pt stating the recommendations per CY and that we were going to send Rx of prednisone to pharmacy for him. Pt expressed understanding. Rx has been sent to pt's preferred pharmacy. Stated to pt after the prednisone Rx, if no better we might need to get him to come into office for eval and pt verbalized understanding. Nothing further needed.

## 2018-10-12 NOTE — Telephone Encounter (Signed)
Suggest we offer prednisone 10 mg, # 20, 4 X 2 DAYS, 3 X 2 DAYS, 2 X 2 DAYS, 1 X 2 DAYS

## 2018-10-12 NOTE — Telephone Encounter (Signed)
Primary Pulmonologist: CY Last office visit and with whom: 04/25/18 with CY What do we see them for (pulmonary problems): COPD Last OV assessment/plan: Instructions    Return in about 6 months (around 10/24/2018).  Script printed for Symbicort  Please call if we can help       Assessment & Plan Note by Deneise Lever, MD at 07/03/2018 4:45 PM  Author: Deneise Lever, MD Author Type: Physician Filed: 07/03/2018 4:45 PM  Note Status: Written Cosign: Cosign Not Required Encounter Date: 04/25/2018  Problem: Lung nodule  Editor: Deneise Lever, MD (Physician)    Any nodule as below level of CXR imaging over the last 2 years and likely benign.     Author: Deneise Lever, MD Author Type: Physician Filed: 07/03/2018 4:44 PM  Note Status: Written Cosign: Cosign Not Required Encounter Date: 04/25/2018  Problem: GERD  Editor: Deneise Lever, MD (Physician)    Ongoing emphasis on reflux precautions.     Author: Deneise Lever, MD Author Type: Physician Filed: 07/03/2018 4:44 PM  Note Status: Written Cosign: Cosign Not Required Encounter Date: 04/25/2018  Problem: COPD mixed type [facility])  Editor: Deneise Lever, MD (Physician)    Imaging has not shown bronchiectasis despite his sustained chronic bronchitis, culture positive for GNR's in the past. He is near baseline now. Plan-renew Symbicort       Was appointment offered to patient (explain)?  Pt wants to know recommendations   Reason for call: Pt started amoxicillin 2 weeks ago and stated he took the last dose yesterday.  Pt stated he has sinus drainage and is coughing up a cream colored mucus. Pt states he does sleep with his head elevated but states he is not able to sleep good due to his breathing and coughing. Pt is also wheezing and also has chest tightness occasionally due to coughing and when he tries to lay down to sleep. Pt states symptoms have now been going on x3 months and has been on multiple rounds of  abx.  Pt is doing breathing treatments twice daily and sometimes three times daily depending on how bad symptoms are. Pt also states he is having to use his rescue inhaler twice if not more daily.  Pt does do his symbicort daily as prescribed and also uses his flutter valve daily.  Pt states O2 stats are ranging 95-97%.  Pt denies any complaints of fever, has not travelled anywhere recently, and has not been around anyone suspected of COVID.  Pt would like to know recommendations to help with his symptoms. Beth, please advise on this for pt. Thanks!

## 2018-10-12 NOTE — Telephone Encounter (Signed)
Is Dr. Annamaria Boots still here, he prescribed amoxicillin

## 2018-10-12 NOTE — Telephone Encounter (Signed)
Patient returned your call.

## 2018-10-12 NOTE — Telephone Encounter (Signed)
CY, please advise. Thanks!

## 2018-10-12 NOTE — Telephone Encounter (Signed)
Left message to call back  About patient and wife visit for 5/18

## 2018-10-17 ENCOUNTER — Ambulatory Visit: Payer: Medicare HMO | Admitting: Cardiology

## 2018-10-18 DIAGNOSIS — M25562 Pain in left knee: Secondary | ICD-10-CM | POA: Diagnosis not present

## 2018-10-18 DIAGNOSIS — Z79891 Long term (current) use of opiate analgesic: Secondary | ICD-10-CM | POA: Diagnosis not present

## 2018-10-18 DIAGNOSIS — M5136 Other intervertebral disc degeneration, lumbar region: Secondary | ICD-10-CM | POA: Diagnosis not present

## 2018-10-18 DIAGNOSIS — G894 Chronic pain syndrome: Secondary | ICD-10-CM | POA: Diagnosis not present

## 2018-10-31 ENCOUNTER — Other Ambulatory Visit: Payer: Self-pay

## 2018-10-31 ENCOUNTER — Telehealth: Payer: Self-pay | Admitting: Internal Medicine

## 2018-10-31 ENCOUNTER — Other Ambulatory Visit: Payer: Self-pay | Admitting: Internal Medicine

## 2018-10-31 ENCOUNTER — Ambulatory Visit (INDEPENDENT_AMBULATORY_CARE_PROVIDER_SITE_OTHER): Payer: Medicare HMO | Admitting: Nurse Practitioner

## 2018-10-31 ENCOUNTER — Ambulatory Visit: Payer: Medicare Other | Admitting: Internal Medicine

## 2018-10-31 ENCOUNTER — Encounter: Payer: Self-pay | Admitting: Nurse Practitioner

## 2018-10-31 DIAGNOSIS — I350 Nonrheumatic aortic (valve) stenosis: Secondary | ICD-10-CM

## 2018-10-31 DIAGNOSIS — J320 Chronic maxillary sinusitis: Secondary | ICD-10-CM | POA: Diagnosis not present

## 2018-10-31 DIAGNOSIS — J441 Chronic obstructive pulmonary disease with (acute) exacerbation: Secondary | ICD-10-CM

## 2018-10-31 HISTORY — DX: Nonrheumatic aortic (valve) stenosis: I35.0

## 2018-10-31 MED ORDER — BUDESONIDE-FORMOTEROL FUMARATE 160-4.5 MCG/ACT IN AERO: INHALATION_SPRAY | RESPIRATORY_TRACT | 0 refills | Status: DC

## 2018-10-31 MED ORDER — CEFDINIR 300 MG PO CAPS: 300.0000 mg | ORAL_CAPSULE | Freq: Two times a day (BID) | ORAL | 0 refills | Status: DC

## 2018-10-31 NOTE — Patient Instructions (Signed)
Please contact ENT for ongoing sinusitis/ recent abnormal sinus CT Will order omnicef Continue mucinex Continue claritin Continue symbicort   Hydrate  Drink enough water to keep your pee (urine) clear or pale yellow. Rest  Rest as much as possible.  Sleep with your head raised (elevated).  Make sure to get enough sleep each night. Other instructions  Put a warm, moist washcloth on your face 3-4 times a day or as told by your doctor. This will help with discomfort.  Wash your hands often with soap and water. If there is no soap and water, use hand sanitizer.  Do not smoke. Avoid being around people who are smoking (secondhand smoke). Call the office if:  You have a fever.  Your symptoms get worse.  Your symptoms do not get better within 10 days.  Follow up with Dr. Annamaria Boots in 2 months or sooner if needed

## 2018-10-31 NOTE — Telephone Encounter (Signed)
Primary Pulmonologist: CY Last office visit and with whom: 04/25/18 with CY What do we see them for (pulmonary problems): COPD Last OV assessment/plan: Assessment & Plan Note by Deneise Lever, MD at 07/03/2018 4:43 PM  Author: Deneise Lever, MD Author Type: Physician Filed: 07/03/2018 4:44 PM  Note Status: Written Cosign: Cosign Not Required Encounter Date: 04/25/2018  Problem: COPD mixed type [facility])  Editor: Deneise Lever, MD (Physician)    Imaging has not shown bronchiectasis despite his sustained chronic bronchitis, culture positive for GNR's in the past. He is near baseline now. Plan-renew Symbicort    Patient Instructions by Deneise Lever, MD at 04/25/2018 3:00 PM  Author: Deneise Lever, MD Author Type: Physician Filed: 04/25/2018 3:29 PM  Note Status: Signed Cosign: Cosign Not Required Encounter Date: 04/25/2018  Editor: Deneise Lever, MD (Physician)    Script printed for Symbicort  Please call if we can help    Instructions  Return in about 6 months (around 10/24/2018).    Was appointment offered to patient (explain)?  Pt wanting recommendations   Reason for call: pt called office 5/13 stating that he wasn't feeling well having complaints of coughing, wheezing, chest tightness and was previously prescribed amoxicillin two weeks prior to this taking last dose of abx 5/12.  Called and spoke with pt who stated he had been feeling better since being prescribed prednisone by CY 5/13. Pt stated he finished prednisone that was prescribed by CY and then two days ago pt began having complaints of cough and postnasal drainage which he describes as being creamy colored and sticky. Pt also has had some tightness in chest, mild wheezing, and also a sore throat. Pt's symptoms have affected his sleeping overnight.  Pt did a breathing treatment overnight and has also had to use rescue inhaler this morning. Pt is using flonase daily to help with nasal drainage and using the  flutter valve daily as prescribed. Pt also states that he has been flushing sinuses with saline to see if that would help. For pt's cough, he has been taking OTC extra strength cough and mucus. Asked pt if he was still taking Symbicort bid as prescribed and pt states he takes all of his meds as prescribed.  Pt stated he had a slight temp of 99 degrees overnight and took a tylenol which helped with pt's temp. Pt stated his temp this morning was 98.2.  Pt is wanting recommendations to help with his symptoms. Sarah, please advise on this for pt. Thanks!

## 2018-10-31 NOTE — Telephone Encounter (Signed)
Needs a televisit or video visit today or tomorrow  Schedule with Kenney Houseman as her schedule is open

## 2018-10-31 NOTE — Assessment & Plan Note (Signed)
Patient states that sinus pressure is greater on the right side with sore throat and ear pain and pressure on the right side.  He states that he has been having wheezing and cough that is productive of tan sputum.  States that last night he did have a low-grade fever.  He states that his temperature has been normal today.  He checked his temperature at 2:00 PM and it was 97.5 F.  His O2 sats on room air have been at 96%.  Patient states that he did have a recent CT of his sinuses completed and showed sinusitis.  He has been treated several times over the last few weeks with antibiotics and prednisone.  He states that he feels better for a while and symptoms return.  He states that he has been using Mucinex and Claritin daily.  He is compliant with Symbicort and albuterol.  His cardiologist has him on Lasix.  He states that cardiology recently increased his dose of Lasix due to lower extremity edema.  Patient Instructions  Please contact ENT for ongoing sinusitis/ recent abnormal sinus CT Will order omnicef Continue mucinex Continue claritin Continue symbicort   Hydrate  Drink enough water to keep your pee (urine) clear or pale yellow. Rest  Rest as much as possible.  Sleep with your head raised (elevated).  Make sure to get enough sleep each night. Other instructions  Put a warm, moist washcloth on your face 3-4 times a day or as told by your doctor. This will help with discomfort.  Wash your hands often with soap and water. If there is no soap and water, use hand sanitizer.  Do not smoke. Avoid being around people who are smoking (secondhand smoke). Call the office if:  You have a fever.  Your symptoms get worse.  Your symptoms do not get better within 10 days.  Follow up with Dr. Annamaria Boots in 2 months or sooner if needed

## 2018-10-31 NOTE — Telephone Encounter (Signed)
Called & spoke w/ pt regarding SG's recommendations. Pt verbalized understanding and agreed to setting up a MyChart virtual visit with TN at 2:00 PM. I let pt know to have MyChart ready by 1:45 PM. Pt expressed understanding.   Routing to TN as an [place]. Nothing further needed at this time.

## 2018-10-31 NOTE — Progress Notes (Signed)
Virtual Visit via Telephone Note  I connected with [name] on 10/31/18 at  2:00 PM EDT by telephone and verified that I am speaking with the correct person using two identifiers.  Location: Patient: home Provider: office   I discussed the limitations, risks, security and privacy concerns of performing an evaluation and management service by telephone and the availability of in person appointments. I also discussed with the patient that there may be a patient responsible charge related to this service. The patient expressed understanding and agreed to proceed.   History of Present Illness: 68 year old male, [place] veteran, never smoker, with bronchiectasis/recurrent pneumonia/Enterobacter/recurrent aspiration who is followed by Dr. Annamaria Boots. PMH includes DM, hypertension, lung nodule, GERD, ischemic colitis/multiple surgeries, valvular heart disease, ASCVD, A. fib (on Eliquis).  Patient has a tele-visit today for sinus congestion pressure and pain.  He states that sinus pressure is greater on the right side with sore throat and ear pain and pressure on the right side.  He states that he has been having wheezing and cough that is productive of tan sputum.  States that last night he did have a low-grade fever.  He states that his temperature has been normal today.  He checked his temperature at 2:00 PM and it was 97.5 F.  His O2 sats on room air have been at 96%.  Patient states that he did have a recent CT of his sinuses completed and showed sinusitis.  He has been treated several times over the last few weeks with antibiotics and prednisone.  He states that he feels better for a while and symptoms return.  He states that he has been using Mucinex and Claritin daily.  He is compliant with Symbicort and albuterol.  His cardiologist has him on Lasix.  He states that cardiology recently increased his dose of Lasix due to lower extremity edema. Denies f/c/s, n/v/d, hemoptysis, PND, leg swelling.     Observations/Objective:  Chest x-ray 09/23/2017 -no acute cardiopulmonary disease   Assessment and Plan: Patient states that sinus pressure is greater on the right side with sore throat and ear pain and pressure on the right side.  He states that he has been having wheezing and cough that is productive of tan sputum.  States that last night he did have a low-grade fever.  He states that his temperature has been normal today.  He checked his temperature at 2:00 PM and it was 97.5 F.  His O2 sats on room air have been at 96%.  Patient states that he did have a recent CT of his sinuses completed and showed sinusitis.  He has been treated several times over the last few weeks with antibiotics and prednisone.  He states that he feels better for a while and symptoms return.  He states that he has been using Mucinex and Claritin daily.  He is compliant with Symbicort and albuterol.  His cardiologist has him on Lasix.  He states that cardiology recently increased his dose of Lasix due to lower extremity edema.  Patient Instructions  Please contact ENT for ongoing sinusitis/ recent abnormal sinus CT Will order omnicef Continue mucinex Continue claritin Continue symbicort   Hydrate  Drink enough water to keep your pee (urine) clear or pale yellow. Rest  Rest as much as possible.  Sleep with your head raised (elevated).  Make sure to get enough sleep each night. Other instructions  Put a warm, moist washcloth on your face 3-4 times a day or as told by  your doctor. This will help with discomfort.  Wash your hands often with soap and water. If there is no soap and water, use hand sanitizer.  Do not smoke. Avoid being around people who are smoking (secondhand smoke). Call the office if:  You have a fever.  Your symptoms get worse.  Your symptoms do not get better within 10 days.   Follow Up Instructions: Follow up with Dr. Annamaria Boots in 2 months or sooner if needed   I discussed the  assessment and treatment plan with the patient. The patient was provided an opportunity to ask questions and all were answered. The patient agreed with the plan and demonstrated an understanding of the instructions.   The patient was advised to call back or seek an in-person evaluation if the symptoms worsen or if the condition fails to improve as anticipated.  I provided 22 minutes of non-face-to-face time during this encounter.   Fenton Foy, NP

## 2018-11-02 ENCOUNTER — Telehealth (HOSPITAL_COMMUNITY): Payer: Self-pay | Admitting: *Deleted

## 2018-11-02 NOTE — Telephone Encounter (Signed)
COVID-19 Pre-Screening Questions:  . Do you currently have a fever?No (yes = cancel and refer to pcp for e-visit) . Have you recently travelled on a cruise, internationally, or to [place], [place], [place], [place], [place], or [place], [place] ([employer]) ?NO (yes = cancel, stay home, monitor symptoms, and contact pcp or initiate e-visit if symptoms develop) . Have you been in contact with someone that is currently pending confirmation of Covid19 testing or has been confirmed to have the Covid19 virus?NO (yes = cancel, stay home, away from tested individual, monitor symptoms, and contact pcp or initiate e-visit if symptoms develop) . Are you currently experiencing fatigue or coughNO (yes = pt should be prepared to have a mask placed at the time of their visit). . Reiterated no additional visitors. . Arrive no earlier than 15 minutes before appointment time. . Please bring own mask.    

## 2018-11-03 ENCOUNTER — Other Ambulatory Visit: Payer: Self-pay

## 2018-11-03 ENCOUNTER — Ambulatory Visit (HOSPITAL_COMMUNITY): Payer: Medicare HMO | Attending: Cardiology

## 2018-11-03 DIAGNOSIS — R0609 Other forms of dyspnea: Secondary | ICD-10-CM

## 2018-11-03 DIAGNOSIS — I4891 Unspecified atrial fibrillation: Secondary | ICD-10-CM | POA: Diagnosis not present

## 2018-11-03 DIAGNOSIS — R0600 Dyspnea, unspecified: Secondary | ICD-10-CM

## 2018-11-03 DIAGNOSIS — Z01810 Encounter for preprocedural cardiovascular examination: Secondary | ICD-10-CM

## 2018-11-03 DIAGNOSIS — I9789 Other postprocedural complications and disorders of the circulatory system, not elsewhere classified: Secondary | ICD-10-CM | POA: Diagnosis not present

## 2018-11-03 DIAGNOSIS — I350 Nonrheumatic aortic (valve) stenosis: Secondary | ICD-10-CM | POA: Insufficient documentation

## 2018-11-03 HISTORY — PX: TRANSTHORACIC ECHOCARDIOGRAM: SHX275

## 2018-11-17 DIAGNOSIS — M5136 Other intervertebral disc degeneration, lumbar region: Secondary | ICD-10-CM | POA: Diagnosis not present

## 2018-11-17 DIAGNOSIS — G894 Chronic pain syndrome: Secondary | ICD-10-CM | POA: Diagnosis not present

## 2018-11-17 DIAGNOSIS — Z79891 Long term (current) use of opiate analgesic: Secondary | ICD-10-CM | POA: Diagnosis not present

## 2018-11-17 DIAGNOSIS — M25562 Pain in left knee: Secondary | ICD-10-CM | POA: Diagnosis not present

## 2018-11-25 DIAGNOSIS — R69 Illness, unspecified: Secondary | ICD-10-CM | POA: Diagnosis not present

## 2018-11-25 DIAGNOSIS — J309 Allergic rhinitis, unspecified: Secondary | ICD-10-CM | POA: Diagnosis not present

## 2018-11-30 ENCOUNTER — Telehealth: Payer: Self-pay | Admitting: Internal Medicine

## 2018-11-30 DIAGNOSIS — J410 Simple chronic bronchitis: Secondary | ICD-10-CM

## 2018-11-30 DIAGNOSIS — J320 Chronic maxillary sinusitis: Secondary | ICD-10-CM

## 2018-11-30 NOTE — Telephone Encounter (Signed)
If he is blowing this from his nose, then please ask our [facility] to help him get an ENT appointment.  If it is coming from his chest, then please order sputum cultures- routine C&S, fungal, and mycobacterial (AFB) for dx chronic bronchitis w exacerbation.

## 2018-11-30 NOTE — Telephone Encounter (Signed)
Pt is calling back [phone]

## 2018-11-30 NOTE — Telephone Encounter (Signed)
[name] returning call has been having problem w/ phone but say call him back @ [phone].[name]

## 2018-11-30 NOTE — Telephone Encounter (Signed)
Attempted to call pt but unable to reach. Left message for pt to return call. 

## 2018-11-30 NOTE — Telephone Encounter (Signed)
At patient's last visit with me his symptoms were more sinus related and he was advised to follow up with ENT. Did he follow with ENT? I will order him another antibiotic and prednisone. Please make follow up visit with Dr. Annamaria Boots at first available. Thanks.

## 2018-11-30 NOTE — Telephone Encounter (Signed)
Primary Pulmonologist: CY Last office visit and with whom: 10/31/2018 with TN What do we see them for (pulmonary problems): COPD Last OV assessment/plan: Instructions    Return in about 2 months (around 12/31/2018) for follow up Dr. Annamaria Boots. Please contact ENT for ongoing sinusitis/ recent abnormal sinus CT Will order omnicef Continue mucinex Continue claritin Continue symbicort   Hydrate  Drink enough water to keep your pee (urine) clear or pale yellow. Rest  Rest as much as possible.  Sleep with your head raised (elevated).  Make sure to get enough sleep each night. Other instructions  Put a warm, moist washcloth on your face 3-4 times a day or as told by your doctor. This will help with discomfort.  Wash your hands often with soap and water. If there is no soap and water, use hand sanitizer.  Do not smoke. Avoid being around people who are smoking (secondhand smoke). Call the office if:  You have a fever.  Your symptoms get worse.  Your symptoms do not get better within 10 days.  Follow up with Dr. Annamaria Boots in 2 months or sooner if needed       Was appointment offered to patient (explain)?  Pt wants recommendations   Reason for call: Called to speak with pt's wife Olin Hauser letting her know that we had sent refill of fluticasone to pharmacy for her but pt was not at home so I spoke with pt's husband Dayln who is also a pt of CY.  While speaking with Marguerite Olea, he stated that he still has problems with bronchitis that is going on even after the pred Rx that was called in by [facility] 5/13 and also after the omnicef which was called in by TN 6/1.  Pt still has complaints of cough with brown phlegm, wheezing,  SOB, and has also had tightness in chest this week.  Pt denies any complaints of fever but states that he has had sweats to the point where he has had to change clothes multiple times due to it.  Pt is having to use nebulizer at least three times during the day and also  has been having to use rescue inhaler 2-3 times daily.  Pt is taking symbicort as prescribed and is using flutter valve to help get mucus up which he states he has had to use almost every hour to help get phlegm up. Pt is also still using mucinex and claritin.  Due to CY not being in office and since TN saw pt last, sending to [facility] for advise. Kenney Houseman, please advise recs for pt. Thanks!  (examples of things to ask: : When did symptoms start? Fever? Cough? Productive? Color to sputum? More sputum than usual? Wheezing? Have you needed increased oxygen? Are you taking your respiratory medications? What over the counter measures have you tried?)

## 2018-11-30 NOTE — Telephone Encounter (Signed)
Called & spoke w/ pt regarding TN's recommendations. Pt states he has been attempting to get an appt w/ ENT doctor - Dr. Redmond Baseman. States even after recent antibiotic and prednisone therapy he is still coughing up thick, brown mucus. He is using saline rinse & flonase. States he would not like another round of prednisone and antibiotic. I confirmed with him that his next appt is w/ CY on 02/09/2019 at 3:30 PM. CY's schedule is currently booked and I would not be able to get pt in any sooner unless he saw an NP. I let him know if this and he refused to see NP. Pt is aware that CY is not currently in the office, however, would still like to hear from him regarding his diagnosis of sinusitis. I let him know I would route a message to CY but it is not certain when he will reply because he won't be back in the office until next Tuesday. Pt expressed understanding.   I let pt know to call our office if his symptoms worsen. Pt verbalized understanding. Routing to CY for f/u per pt.

## 2018-12-01 NOTE — Telephone Encounter (Signed)
Spoke with patient on the phone. He stated that he is interested in having the referral placed for Dr. Redmond Baseman and doing the sputum cultures. He will stop by the office next Monday to pick up the cups. Verbalized understanding about instructions.   Nothing further needed at time of call.

## 2018-12-05 ENCOUNTER — Telehealth: Payer: Self-pay | Admitting: Internal Medicine

## 2018-12-05 NOTE — Telephone Encounter (Addendum)
Spoke with pt's wife, she states he is supposed to be coming by to pick up sputum. I advised her that they would be up front for pt to pick up tomorrow. Orders already in the chart. Nothing further is needed.

## 2018-12-08 ENCOUNTER — Other Ambulatory Visit: Payer: Self-pay | Admitting: Physician Assistant

## 2018-12-08 ENCOUNTER — Other Ambulatory Visit: Payer: Self-pay

## 2018-12-08 ENCOUNTER — Ambulatory Visit (HOSPITAL_COMMUNITY)
Admission: RE | Admit: 2018-12-08 | Discharge: 2018-12-08 | Disposition: A | Discharge status: Home / Self Care | Payer: Medicare HMO | Source: Ambulatory Visit | Attending: Cardiology | Admitting: Cardiology

## 2018-12-08 DIAGNOSIS — I6523 Occlusion and stenosis of bilateral carotid arteries: Secondary | ICD-10-CM

## 2018-12-14 ENCOUNTER — Other Ambulatory Visit: Payer: Medicare HMO

## 2018-12-14 ENCOUNTER — Telehealth: Payer: Self-pay | Admitting: Cardiology

## 2018-12-14 DIAGNOSIS — G894 Chronic pain syndrome: Secondary | ICD-10-CM | POA: Diagnosis not present

## 2018-12-14 DIAGNOSIS — M5136 Other intervertebral disc degeneration, lumbar region: Secondary | ICD-10-CM | POA: Diagnosis not present

## 2018-12-14 DIAGNOSIS — J410 Simple chronic bronchitis: Secondary | ICD-10-CM

## 2018-12-14 DIAGNOSIS — Z79891 Long term (current) use of opiate analgesic: Secondary | ICD-10-CM | POA: Diagnosis not present

## 2018-12-14 DIAGNOSIS — M25562 Pain in left knee: Secondary | ICD-10-CM | POA: Diagnosis not present

## 2018-12-14 NOTE — Telephone Encounter (Signed)
Called patient. He reports he has had a cough since February. He gets bronchitis every year. He denies a fever, congestion, body aches, or sore throat. Patient advised to com to appointment as planned and where mask. He will call office when he arrives and follow protocol for entering for appointment. Patient verbally understands. No further questions.

## 2018-12-14 NOTE — Telephone Encounter (Signed)
I called pt to confirm his appt with Dr Ellyn Hack on 12-15-18.         COVID-19 Pre-Screening Questions:   In the past 7 to 10 days have you had a cough,  shortness of breath, headache, congestion, fever (100 or greater) body aches, chills, sore throat, or sudden loss of taste or sense of smell?  yes  Have you been around anyone with known Covid 19.  Have you been around anyone who is awaiting Covid 19 test results in the past 7 to 10 days? no  Have you been around anyone who has been exposed to Covid 19, or has mentioned symptoms of Covid 19 within the past 7 to 10 days? no  If you have any concerns/questions about symptoms patients report during screening (either on the phone or at threshold). Contact the provider seeing the patient or DOD for further guidance.  If neither are available contact a member of the leadership team.

## 2018-12-14 NOTE — Telephone Encounter (Signed)
New Message:     Pt is coming in to see Dr Ellyn Hack tomorrow(12-15-18). Pt says he has a cough. Had cough, since February. Pt has no other symptoms. He have been to Pulmonary and Primary doctor, nobody says he has COVID-19, because he has no other symptoms. He says he have been her for a Doppler recently.

## 2018-12-15 ENCOUNTER — Encounter: Payer: Self-pay | Admitting: Cardiology

## 2018-12-15 ENCOUNTER — Other Ambulatory Visit: Payer: Self-pay

## 2018-12-15 ENCOUNTER — Ambulatory Visit: Payer: Medicare HMO | Admitting: Cardiology

## 2018-12-15 VITALS — BP 153/81 | HR 80 | Temp 98.2°F | Ht 73.0 in | Wt 241.0 lb

## 2018-12-15 DIAGNOSIS — I110 Hypertensive heart disease with heart failure: Secondary | ICD-10-CM

## 2018-12-15 DIAGNOSIS — E785 Hyperlipidemia, unspecified: Secondary | ICD-10-CM | POA: Diagnosis not present

## 2018-12-15 DIAGNOSIS — I5032 Chronic diastolic (congestive) heart failure: Secondary | ICD-10-CM

## 2018-12-15 DIAGNOSIS — I4891 Unspecified atrial fibrillation: Secondary | ICD-10-CM | POA: Diagnosis not present

## 2018-12-15 DIAGNOSIS — I350 Nonrheumatic aortic (valve) stenosis: Secondary | ICD-10-CM

## 2018-12-15 DIAGNOSIS — R600 Localized edema: Secondary | ICD-10-CM | POA: Diagnosis not present

## 2018-12-15 DIAGNOSIS — R0609 Other forms of dyspnea: Secondary | ICD-10-CM | POA: Diagnosis not present

## 2018-12-15 DIAGNOSIS — I9789 Other postprocedural complications and disorders of the circulatory system, not elsewhere classified: Secondary | ICD-10-CM | POA: Diagnosis not present

## 2018-12-15 DIAGNOSIS — R0600 Dyspnea, unspecified: Secondary | ICD-10-CM

## 2018-12-15 NOTE — Patient Instructions (Addendum)
Medication Instructions:  NO CHANGES   Lab work: NOT NEEDED   Testing/Procedures: Will  be schedule in [place] physician has requested that you have an echocardiogram. Echocardiography is a painless test that uses sound waves to create images of your heart. It provides your doctor with information about the size and shape of your heart and how well your heart's chambers and valves are working. This procedure takes approximately one hour. There are no restrictions for this procedure.     Follow-Up: At [facility], you and your health needs are our priority.  As part of our continuing mission to provide you with exceptional heart care, we have created designated Provider Care Teams.  These Care Teams include your primary Cardiologist (physician) and Advanced Practice Providers (APPs -  Physician Assistants and Nurse Practitioners) who all work together to provide you with the care you need, when you need it. . You will need a follow up appointment in  6 months jan 2021.  Please call our office 2 months in advance to schedule this appointment.  You may see [name], MD or one of the following Advanced Practice Providers on your designated Care Team:   . Rosaria Ferries, PA-C . Jory Sims, DNP, ANP  Any Other Special Instructions Will Be Listed Below (If Applicable).

## 2018-12-15 NOTE — Progress Notes (Signed)
PCP: Hayden Rasmussen, MD  Clinic Note: Chief Complaint  Patient presents with  . Follow-up    After echo  . Aortic Stenosis  . Atrial Fibrillation    HPI: [name] is a 68 y.o. male with a complicated past medical history (many conditions stemming from his history of agent orange exposure and abdominal injuries/surgeries during the [place] War) with cardiac conditions being PAF and now relatively rapid progression from moderate now severe aortic stenosis.  [name] is here essentially for 52-month follow-up  PMH notable for:  HTN (with Hypertensive Heart Disease / HFPEF - on Lasix & support socks), mod-Severe AS, carotid artery disease, COPD (along with multiple other complications related to Agent Orange), multiple GI surgeries related to war injuries. . --> most recent cardiac Dx PAF (was post-op from Knee Sgx) --> converted with Amiodarone gtt, started on Xarelto.  [name] has had 3 cardiac catheterizations - all with minimal CAD. -- most recent R&LHC 09/2016 - angiographically normal tortuous coronaries, mild Pulm HTN with severely elevated LVEDP & systemic HTN.    Moderate to severe AS (mean gradient 36 mmHg) -> Progressed to ~  Severe AS (Mean gradient 41 mmHg)  Started on Irbesartan 75 mg for HTN (Feb 2019) -has been titrated up to 300.  [name] was last seen on Jan 2020 -was in the process of being evaluated for possible knee surgery and shoulder surgery.  This was thought to be highly risky because of him going to A. fib during 1 of the surgeries.  [name] noted to me that his edema been well controlled taking Lasix about every other day.  Had not had any further symptoms to suggest A. fib.  [name] was concerned because the [place] pharmacist has told him that they no longer carry irbesartan, nor is Xarelto on his formulary.  Recent Hospitalizations:   None  Studies Personally Reviewed - (if available, images/films reviewed: From Epic Chart or Care Everywhere)  Transthoracic Echo  September 2019: Mild LVH.  EF 60 to 65%.  GR 1 DD.  Moderate to severe aortic stenosis.  Mean gradient 36 mmHg.  Peak 65 mmHg. -->  This does show progression from last year.  (Plan to follow-up echo March 2020)  TTR 11/03/2018: (progression of AS - severe - Mean Gradient 41 mmHg, Peak 65 mmHg).  EF >65%. Mod Conc LVH - Gr 1 DD. Mild LA dilation.   Carotid Dopplers July 2020: Bilateral ICA <40%.  Bilateral vertebral arteries and subclavian arteries normal flow.   Interval History: [name] presents for routine follow-up actually doing well from a cardiac standpoint.  [name] has not had any sensation to suggest A. fib.  May be some short bursts of 5 to 10 minutes of irregular heartbeats but does not sound like it was anything associated with a prolonged A. fib episode.  [name] says that [name] is decided that [name] does not want to have any more surgeries on either his knee or shoulder.  [name] is discounted "ride it out until the grave with his current level of pain.  As such, [name] really is not doing much exercise, but is trying to stay active with doing some walking and conditioning exercises.  Off-and-on will have some episodes of orthopnea or shortness of breath when walking uphill or up steps which is probably just because the effort.  [name] will have some swelling in his legs that may come and go sometimes it is bad, others normal.  The edema usually happens  at the end of the day and goes down by the morning.  [name] still takes his Lasix daily and occasionally takes additional doses but not recently.  Since I last saw him [name] said several bouts of bronchitis walking pneumonia etc. have been on antibiotics and steroids, inhalers treatments.  This is kept him more grounded than anything else.  [name] still has different musculoskeletal type chest discomfort symptoms that have been ongoing since have known him, but denies any exertional chest tightness or pressure dyspnea [name] has some deconditioning.  Intermittent orthopneic, but not  consistent and no PND.  No syncope/near syncope or TIA shows amorous fugax.  No melena, hematochezia, hematuria epistaxis.  ROS: A comprehensive was performed. Review of Systems  Constitutional: Negative for chills, fever and malaise/fatigue (better ).  HENT: Negative for congestion and nosebleeds (None recently).        Still having issues with getting his dentures fully fitted.  Respiratory: Positive for shortness of breath (walking - related to joint pain). Negative for cough (off & on), sputum production and wheezing.        Frequent bouts of bronchitis/URI symptoms/walking pneumonia, but no recent infectious symptoms.  Cardiovascular: Positive for palpitations (off & on - no more than 15 min) and leg swelling (Controlled with daily Lasix). Negative for claudication.  Gastrointestinal: Positive for abdominal pain. Negative for blood in stool, constipation and melena.       Bowels & abdominal pain from adhesions seems to have stabilized -- is on a new medication.  Genitourinary: Negative for hematuria.  Musculoskeletal: Positive for back pain and joint pain (L knee - recent TKA is loose.  R shoulder frozen.  R hand also severe OA. ). Negative for falls (none since last visit).  Neurological: Positive for dizziness (poor center of balance.) and weakness (global - esp legs). Negative for focal weakness and headaches.  Endo/Heme/Allergies: Negative for environmental allergies.  Psychiatric/Behavioral: Positive for memory loss. Negative for depression. The patient is nervous/anxious (now having PTSD since [place] conflict acting up -). The patient does not have insomnia.   All other systems reviewed and are negative.  I have reviewed and (if needed) personally updated the patient's problem list, medications, allergies, past medical and surgical history, social and family history.   Past Medical History:  Diagnosis Date  . Anemia   . Asthma   . Back pain, chronic, followed at pain clinic  11/25/2011  . Carotid artery stenosis 09/06/2014   By Dopplers in September 2017: Right ICA 40-59%.  Less than 40% left ICA.  [name] Chest pain with low risk for cardiac etiology 10/2011   Non-obstructive CAD by Cath; negative Lexiscan in 08/2011  . Clotting disorder (HCC)    LLE DVT (also small vessel)  . Colon polyps   . COPD (chronic obstructive pulmonary disease) ([place])   . Dementia ([place])    Mild  . Depression   . Dyslipidemia (high LDL; low HDL)    statin intolerant; on fibrate  . Edema leg from Venous Stasis    Venous stasis :wears compression stockings; 08/2012 dopplers - no DVT or thrombophlebitis; mild R Popliteal V reflux - no VNUS ablation candidate  . Fainting   . Family history of acute myocardial infarction. and premature CAD 11/25/2011  . GERD (gastroesophageal reflux disease)   . Hearing loss   . History of diabetes mellitus   . Hypertension    very labile  . Nephrolithiasis   . Osteoarthritis   . Other idiopathic peripheral autonomic  neuropathy    Agent orange  . PTSD (post-traumatic stress disorder) 07/24/2015   per patient approach from foot of bed to awake; do not apply any contricting pressure, also avoid approaching from behind with any loud noises   . Seizure ([place])   . Severe aortic stenosis by prior echocardiogram 10/2018   EF greater than 65%.  Moderate concentric LVH.  01 DD.  Mild LA.  Now AS mean gradient 41 mmHg, peak gradient 65 progression from moderate to now severe)  . Small bowel obstruction ([place]) 1990s, 2001, 2015   s/p multiple bowel surgeries; from war wounds  . Stroke [facility])     Past Surgical History:  Procedure Laterality Date  . APPENDECTOMY  1958   per patient  . Carotid Dopplers  01/2016   Stable. RICA - slight progression from<40% to 40-59%.  Otherwise stable bilateral carotids and subclavian arteries. Stable LICA  . COLONOSCOPY    . DENTAL SURGERY  01/14/2017   5 extractions in preparation for LTKA on 01-25-17; also was started on  amoxicillin x 7days; has since completed therapy   . exploratory laparotomy with extensive lysis of adhesions  10/1999  . JOINT REPLACEMENT     right knee replaced 2x  . LEFT HEART CATHETERIZATION WITH CORONARY ANGIOGRAM N/A 11/26/2011   WNL Lorretta Harp, MD  . NM MYOVIEW LTD  08/2011   Negative lexiscan myoview: No ischemia or infarction  . RIGHT/LEFT HEART CATH AND CORONARY ANGIOGRAPHY N/A 10/22/2016   Procedure: Right/Left Heart Cath and Coronary Angiography;  Surgeon: Leonie Man, MD;  Location: [facility] INVASIVE CV LAB:: Angiographically normal/minimal CAD with tortuous coronary arteries.  Mild pulmonary pretension secondary to severely elevated LVEDP and systemic hypertension.  Mild-moderate aortic valve stenosis with mean gradient 21 mmHg.  [name] small bowel obstruction  1996, 1999, 2001, 2015  . TONSILLECTOMY  1960   per patient  . TOTAL KNEE ARTHROPLASTY    . TOTAL KNEE ARTHROPLASTY Left 01/25/2017   Procedure: LEFT TOTAL KNEE ARTHROPLASTY;  Surgeon: Gaynelle Arabian, MD;  Location: [facility];  Service: Orthopedics;  Laterality: Left;  Adductor Block  . TRANSTHORACIC ECHOCARDIOGRAM  11/03/2018   (progression of AS - severe - Mean Gradient 41 mmHg, Peak 65 mmHg).  EF >65%. Mod Conc LVH - Gr 1 DD. Mild LA dilation.   . TRANSTHORACIC ECHOCARDIOGRAM  01/2017   a) 2018: Moderate LVH.  EF 65-70%.  No RW MA.  Mod AS (mean gradient 25 mmHg -notable progression since 2013).;; b) 01/2018:  Mild LVH.  EF 60 to 65%.  GR 1 DD.  Moderate to severe aortic stenosis.  Mean gradient 36 mmHg.  Peak 65 mmHg. -->  This does show progression from last year.  (Plan to follow-up echo March 2020)    Current Meds  Medication Sig  . albuterol (ACCUNEB) 0.63 MG/3ML nebulizer solution Take 1 ampule by nebulization every 6 (six) hours as needed for wheezing or shortness of breath.   . Albuterol Sulfate 108 (90 Base) MCG/ACT AEPB Inhale into the lungs.  . ALPRAZolam (XANAX) 1 MG tablet Take 1 tablet (1 mg total) by mouth 3  (three) times daily as needed for anxiety or sleep. (Patient taking differently: Take 1-2 mg by mouth every 8 (eight) hours as needed (for anxiety/PTSD). )  . apixaban (ELIQUIS) 5 MG TABS tablet Take 1 tablet (5 mg total) by mouth 2 (two) times daily.  . beclomethasone (BECONASE-AQ) 42 MCG/SPRAY nasal spray Place 1 spray into both nostrils daily. Dose is for  each nostril.  . budesonide-formoterol (SYMBICORT) 160-4.5 MCG/ACT inhaler INHALE 2 PUFFS INTO THE LUNGS TWICE A DAY RINSE MOUTH WELL AFTER USE  . carvedilol (COREG) 25 MG tablet Take 1 tablet (25 mg total) by mouth 2 (two) times daily.  . fluticasone (FLONASE) 50 MCG/ACT nasal spray Place 1 spray into both nostrils daily.  . furosemide (LASIX) 20 MG tablet Take 1 tablet (20 mg total) by mouth daily. MAY TAKE ADDITIONAL TABLET AS NEEDED SWELLING OR SHORTNESS OF BREATH  . irbesartan (AVAPRO) 300 MG tablet Take 1 tablet (300 mg total) by mouth daily.  [name] morphine (MSIR) 15 MG tablet TK 1 T PO Q 4 H PRN P  . nitroGLYCERIN (NITROSTAT) 0.4 MG SL tablet Place 1 tablet (0.4 mg total) under the tongue every 5 (five) minutes as needed for chest pain.  . polyethylene glycol (MIRALAX / GLYCOLAX) packet Take 17 g by mouth 2 (two) times daily as needed for moderate constipation.   [name] Respiratory Therapy Supplies (FLUTTER) DEVI Use as directed  . rosuvastatin (CRESTOR) 10 MG tablet TAKE 1 TABLET BY MOUTH EVERY DAY    Allergies  Allergen Reactions  . Bee Venom Anaphylaxis  . Codeine Shortness Of Breath and Rash  . Doxycycline Shortness Of Breath and Rash  . Ivp Dye [Iodinated Diagnostic Agents] Shortness Of Breath    Lost consciousness/difficulty breathing Omni - Paque Contrast   . Levaquin [Levofloxacin] Hives and Shortness Of Breath  . Oxycodone Hcl Shortness Of Breath, Swelling and Rash  . Stadol [Butorphanol] Shortness Of Breath  . Succinylcholine Chloride Shortness Of Breath, Nausea Only, Rash and Other (See Comments)    Difficulty breathing   .  Atorvastatin     myalgias  . Bystolic [Nebivolol Hcl] Other (See Comments)    Nightmares, flashbacks (PTSD)  . Iohexol      Desc: hives,neck and torso erythemia   . Methocarbamol Nausea Only    rash  . Pentazocine Lactate     rash  . Tamiflu [Oseltamivir Phosphate]   . Dilaudid [Hydromorphone Hcl] Nausea Only and Rash    Aggressive   . Synvisc [Hylan G-F 20] Hives and Rash    Social History   Tobacco Use  . Smoking status: Never Smoker  . Smokeless tobacco: Former Network engineer Use Topics  . Alcohol use: Yes    Comment: occasional beer   . Drug use: No   Social History   Social History Narrative   [name] is a father of 2, grandfather 22.    One of his sons was killed during the war in [place]. Grandson killed bike accident 2017.   [name] was exposed to [employer].   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   Not very physically active due to extreme disability/debilitation from osteoarthritis and peripheral neuropathy.    family history includes Heart attack in his brother, father, mother, sister, and son; Heart disease in his father and mother; Hypertension in his brother and brother; Non-Hodgkin's lymphoma in his daughter; Sleep apnea in his son; Stroke in his mother and son.  Wt Readings from Last 3 Encounters:  12/15/18 241 lb (109.3 kg)  06/15/18 246 lb 12.8 oz (111.9 kg)  04/25/18 238 lb (108 kg)    PHYSICAL EXAM BP (!) 153/81   Pulse 80   Temp 98.2 F (36.8 C)   Ht 6\' 1"  (1.854 m)   Wt 241 lb (109.3 kg)   SpO2 97%  BMI 31.80 kg/m  Physical Exam  Constitutional: [name] is oriented to person, place, and time. [name] appears well-developed and well-nourished. No distress.  Chronically ill-appearing gentleman.  Walks with a cane.  Protuberant abdomen.  Well-groomed and in good spirits today.[name]  HENT:  Head: Normocephalic and atraumatic.  Neck: Normal range of motion. Neck supple. Hepatojugular reflux  (minimal) present. No JVD present. Carotid bruit is present (R sided bruit vs. radiated AS murmur).  Cardiovascular: Normal rate, regular rhythm, S1 normal and S2 normal.  Occasional extrasystoles are present. PMI is not displaced. Exam reveals distant heart sounds and decreased pulses (diffificult to palpate due to LE edema). Exam reveals no gallop and no friction rub.  Murmur heard.  Medium-pitched harsh crescendo-decrescendo midsystolic murmur is present with a grade of 3/6 at the upper right sternal border radiating to the neck. Pulmonary/Chest: Effort normal. No respiratory distress. [name] has no wheezes. [name] has no rales.  Mild diffuse interstitial sounds, but no rales or rhonchi.  Abdominal: Soft. Bowel sounds are normal. [name] exhibits no distension. There is abdominal tenderness (Around surgical scars.). There is no rebound.  States that his abdomen is less symptomatic since starting a new medication.  Musculoskeletal: Normal range of motion.        General: No edema (Trivial).  Neurological: [name] is alert and oriented to person, place, and time.  Psychiatric: [name] has a normal mood and affect. His behavior is normal. Judgment and thought content normal.  Odd affect, normal mood  Vitals reviewed.    Adult ECG Report NSR rate: 80;  Rhythm: normal sinus rhythm and non-specific ST-T change; LVH.  Normal axis, intervals and durations  Narrative Interpretation: Essentially normal, stable  Other studies Reviewed: Additional studies/ records that were reviewed today include:  Recent Labs:   Lab Results  Component Value Date   CHOL 158 06/15/2018   HDL 47 06/15/2018   LDLCALC 76 06/15/2018   LDLDIRECT 66.4 02/02/2007   TRIG 174 (H) 06/15/2018   CHOLHDL 3.4 06/15/2018    Lab Results  Component Value Date   CREATININE 0.71 02/09/2017   BUN 8 02/09/2017   NA 133 (L) 02/09/2017   K 3.5 02/09/2017   CL 102 02/09/2017   CO2 21 (L) 02/09/2017   (checked today)  ASSESSMENT / PLAN: Problem  List Items Addressed This Visit    Severe aortic stenosis by prior echocardiogram - Primary (Chronic)    Sequential progression now up to severe aortic stenosis.  Just barely crosses the threshold with an increase of mean gradient from 36 up to 41 mmHg.  [name] has multiple different symptoms, but I will think the symptoms are related to his aortic stenosis.  Probably not significant enough yet.  We talked about concerning symptoms including progression of CHF, anginal symptoms or syncope/near syncope.  [name] will be on look out for the symptoms.  Plan to follow-up echocardiogram in January 2021      Postoperative atrial fibrillation [facility]):  CHA2DS2-VASc Score 6 (Chronic)    [name] says [name] is had short little bursts of irregular heartbeats but no prolonged episodes like [name] had postop from his knee surgery.  [name] does remain on Eliquis for stroke prevention and is on stable dose of carvedilol for rate control.  Continue to monitor, but at this point no need for antiarrhythmic.      Relevant Orders   EKG 12-Lead (Completed)   ECHOCARDIOGRAM COMPLETE   Hypertensive heart disease with chronic diastolic congestive heart failure (HCC) (Chronic)  Blood pressure seems high today.  But [name] tells me that it usually is below 140/80 mmHg.  [name] is on high-dose ARB and carvedilol.  [name] has had labile blood pressures and therefore I am reluctant to actually add another agent besides his diuretic.      Relevant Orders   EKG 12-Lead (Completed)   ECHOCARDIOGRAM COMPLETE   Hyperlipidemia with target LDL less than 100 (Chronic)    His LDL actually looks good as of January. [name] remains on low-dose rosuvastatin and tolerating it well.      DOE (dyspnea on exertion) (Chronic)    This remains multifactorial.  As much related to pulmonary issues as it is too any potential diastolic dysfunction.  Unfortunately now the complicating factor is that [name] also has progression to severe aortic stenosis.  It makes this have to pay  more attention to his symptoms and actually to see if there is any progression of symptoms as far as dyspnea or chest pain with exertion or other heart failure symptoms.  Otherwise if [name] is stable with no notable change I would not consider to be symptomatic aortic stenosis.      Relevant Orders   EKG 12-Lead (Completed)   ECHOCARDIOGRAM COMPLETE   Bilateral lower extremity edema (Chronic)    Pretty well controlled with his standing Lasix and as needed additional dosing.  [name] is not wearing his support stockings today, but [name] usually does and that does help.  [name] does have difficulty getting them on and off.        I spent close to 30 minutes with [name] and his wife today.  Close to 90% of this was with direct patient counseling for issues noted above.   Current medicines are reviewed at length with the patient today.  (+/- concerns) n/a The following changes have been made:  see below  Patient Instructions  Medication Instructions:  NO CHANGES   Lab work: NOT NEEDED   Testing/Procedures: Will  be schedule in [place] physician has requested that you have an echocardiogram. Echocardiography is a painless test that uses sound waves to create images of your heart. It provides your doctor with information about the size and shape of your heart and how well your heart's chambers and valves are working. This procedure takes approximately one hour. There are no restrictions for this procedure.     Follow-Up: At [facility], you and your health needs are our priority.  As part of our continuing mission to provide you with exceptional heart care, we have created designated Provider Care Teams.  These Care Teams include your primary Cardiologist (physician) and Advanced Practice Providers (APPs -  Physician Assistants and Nurse Practitioners) who all work together to provide you with the care you need, when you need it. . You will need a follow  up appointment in  6 months jan 2021.  Please call our office 2 months in advance to schedule this appointment.  You may see [name], MD or one of the following Advanced Practice Providers on your designated Care Team:   . Rosaria Ferries, PA-C . Jory Sims, DNP, ANP  Any Other Special Instructions Will Be Listed Below (If Applicable). Studies Ordered:   Orders Placed This Encounter  Procedures  . EKG 12-Lead  . ECHOCARDIOGRAM COMPLETE      [name], M.D., M.S. Interventional Cardiologist   Pager # [phone] Phone # [phone] [address] [place],  [address]  Thank you for choosing Heartcare at [facility]!!

## 2018-12-20 ENCOUNTER — Telehealth: Payer: Self-pay | Admitting: Internal Medicine

## 2018-12-20 NOTE — Telephone Encounter (Signed)
Checked to see if results have come back on pt's sputum culture and we have not yet received the results. Called and spoke with pt letting him know this and stated to him that we would call him as soon as we receive the results. Pt verbalized understanding.nothing further needed.

## 2018-12-21 ENCOUNTER — Encounter: Payer: Self-pay | Admitting: Cardiology

## 2018-12-21 NOTE — Assessment & Plan Note (Signed)
This remains multifactorial.  As much related to pulmonary issues as it is too any potential diastolic dysfunction.  Unfortunately now the complicating factor is that he also has progression to severe aortic stenosis.  It makes this have to pay more attention to his symptoms and actually to see if there is any progression of symptoms as far as dyspnea or chest pain with exertion or other heart failure symptoms.  Otherwise if he is stable with no notable change I would not consider to be symptomatic aortic stenosis.

## 2018-12-21 NOTE — Assessment & Plan Note (Signed)
His LDL actually looks good as of January. He remains on low-dose rosuvastatin and tolerating it well.

## 2018-12-21 NOTE — Assessment & Plan Note (Signed)
He says he is had short little bursts of irregular heartbeats but no prolonged episodes like he had postop from his knee surgery.  He does remain on Eliquis for stroke prevention and is on stable dose of carvedilol for rate control.  Continue to monitor, but at this point no need for antiarrhythmic.

## 2018-12-21 NOTE — Assessment & Plan Note (Signed)
Sequential progression now up to severe aortic stenosis.  Just barely crosses the threshold with an increase of mean gradient from 36 up to 41 mmHg.  He has multiple different symptoms, but I will think the symptoms are related to his aortic stenosis.  Probably not significant enough yet.  We talked about concerning symptoms including progression of CHF, anginal symptoms or syncope/near syncope.  He will be on look out for the symptoms.  Plan to follow-up echocardiogram in January 2021

## 2018-12-21 NOTE — Assessment & Plan Note (Signed)
Blood pressure seems high today.  But he tells me that it usually is below 140/80 mmHg.  He is on high-dose ARB and carvedilol.  He has had labile blood pressures and therefore I am reluctant to actually add another agent besides his diuretic.

## 2018-12-21 NOTE — Assessment & Plan Note (Signed)
Pretty well controlled with his standing Lasix and as needed additional dosing.  He is not wearing his support stockings today, but he usually does and that does help.  He does have difficulty getting them on and off.

## 2018-12-23 DIAGNOSIS — J441 Chronic obstructive pulmonary disease with (acute) exacerbation: Secondary | ICD-10-CM | POA: Diagnosis not present

## 2018-12-23 DIAGNOSIS — R062 Wheezing: Secondary | ICD-10-CM | POA: Diagnosis not present

## 2018-12-23 DIAGNOSIS — J329 Chronic sinusitis, unspecified: Secondary | ICD-10-CM | POA: Diagnosis not present

## 2018-12-29 ENCOUNTER — Other Ambulatory Visit: Payer: Self-pay | Admitting: Cardiology

## 2019-01-02 ENCOUNTER — Telehealth: Payer: Self-pay | Admitting: Internal Medicine

## 2019-01-02 NOTE — Telephone Encounter (Signed)
We will await final culture reports, which can take awhile. Please call if bronchitis symptoms of cough, infected looking sputum and fever get worse than usual.

## 2019-01-02 NOTE — Telephone Encounter (Signed)
Called & spoke w/ pt regarding CY's recommendations. Pt verbalized understanding with no additional questions. Nothing further needed at this time.

## 2019-01-02 NOTE — Telephone Encounter (Signed)
Called & spoke w/ pt. Pt is requesting sputum culture results from 12/14/2018. Upon looking at pt's chart, these cultures have not been resulted yet. I informed him of this and let him know we will reach out to him as soon as these are resulted. Pt expressed understanding.   Pt reports chronic cough w/ light brown mucus, especially upon waking. He states he saw his PCP recently and PCP observed a notable wheeze from pt. Pt states he's been on antibiotics for his breathing symptoms and has an ENT appointment with Dr. Redmond Baseman this Friday. He denies any acute symptoms other than a low-grade fever 99*F last Friday 12/14/2018 resolved with Tylenol use. He states he is also taking Mucinex daily. Pt states he has not been around anyone with known or suspected coronavirus, denies travel, and denies the cardinal symptoms.   I let pt know I would get a message routed to CY. Pt verbalized understanding with no additional questions.  CY, please advise if you have any further recommendations. Thank you.

## 2019-01-06 DIAGNOSIS — J322 Chronic ethmoidal sinusitis: Secondary | ICD-10-CM | POA: Diagnosis not present

## 2019-01-06 DIAGNOSIS — J321 Chronic frontal sinusitis: Secondary | ICD-10-CM | POA: Diagnosis not present

## 2019-01-06 DIAGNOSIS — J3489 Other specified disorders of nose and nasal sinuses: Secondary | ICD-10-CM | POA: Diagnosis not present

## 2019-01-06 DIAGNOSIS — J320 Chronic maxillary sinusitis: Secondary | ICD-10-CM | POA: Diagnosis not present

## 2019-01-23 DIAGNOSIS — J441 Chronic obstructive pulmonary disease with (acute) exacerbation: Secondary | ICD-10-CM | POA: Diagnosis not present

## 2019-01-23 DIAGNOSIS — J329 Chronic sinusitis, unspecified: Secondary | ICD-10-CM | POA: Diagnosis not present

## 2019-01-23 DIAGNOSIS — R062 Wheezing: Secondary | ICD-10-CM | POA: Diagnosis not present

## 2019-01-25 ENCOUNTER — Telehealth: Payer: Self-pay | Admitting: Internal Medicine

## 2019-01-25 NOTE — Telephone Encounter (Signed)
Called spoke with Dr. Redmond Baseman let him know Dr. Annamaria Boots is out of the office until Wednesday 02/01/19. Dr. Redmond Baseman said he would wait for Dr. Annamaria Boots to call him back. I will route this to Dr. Annamaria Boots for his return.

## 2019-01-27 LAB — MYCOBACTERIA,CULT W/FLUOROCHROME SMEAR
MICRO NUMBER:: 669832
SMEAR:: NONE SEEN
SPECIMEN QUALITY:: ADEQUATE

## 2019-01-27 LAB — FUNGUS CULTURE W SMEAR
MICRO NUMBER:: 669831
SMEAR:: NONE SEEN
SPECIMEN QUALITY:: ADEQUATE

## 2019-01-31 DIAGNOSIS — J329 Chronic sinusitis, unspecified: Secondary | ICD-10-CM | POA: Diagnosis not present

## 2019-01-31 DIAGNOSIS — Z0000 Encounter for general adult medical examination without abnormal findings: Secondary | ICD-10-CM | POA: Diagnosis not present

## 2019-01-31 DIAGNOSIS — E039 Hypothyroidism, unspecified: Secondary | ICD-10-CM | POA: Diagnosis not present

## 2019-01-31 DIAGNOSIS — J441 Chronic obstructive pulmonary disease with (acute) exacerbation: Secondary | ICD-10-CM | POA: Diagnosis not present

## 2019-01-31 DIAGNOSIS — R4182 Altered mental status, unspecified: Secondary | ICD-10-CM | POA: Diagnosis not present

## 2019-01-31 DIAGNOSIS — E785 Hyperlipidemia, unspecified: Secondary | ICD-10-CM | POA: Diagnosis not present

## 2019-01-31 DIAGNOSIS — E119 Type 2 diabetes mellitus without complications: Secondary | ICD-10-CM | POA: Diagnosis not present

## 2019-01-31 DIAGNOSIS — R55 Syncope and collapse: Secondary | ICD-10-CM | POA: Diagnosis not present

## 2019-02-01 ENCOUNTER — Other Ambulatory Visit: Payer: Self-pay | Admitting: Family Medicine

## 2019-02-01 DIAGNOSIS — S0990XA Unspecified injury of head, initial encounter: Secondary | ICD-10-CM

## 2019-02-01 NOTE — Telephone Encounter (Signed)
CY, Dr. Redmond Baseman is awaiting a call back from you when you are available. Please advise, thank you.

## 2019-02-02 ENCOUNTER — Telehealth: Payer: Self-pay | Admitting: Internal Medicine

## 2019-02-02 DIAGNOSIS — R4182 Altered mental status, unspecified: Secondary | ICD-10-CM | POA: Diagnosis not present

## 2019-02-02 DIAGNOSIS — J441 Chronic obstructive pulmonary disease with (acute) exacerbation: Secondary | ICD-10-CM | POA: Diagnosis not present

## 2019-02-02 DIAGNOSIS — R55 Syncope and collapse: Secondary | ICD-10-CM | POA: Diagnosis not present

## 2019-02-02 DIAGNOSIS — J329 Chronic sinusitis, unspecified: Secondary | ICD-10-CM | POA: Diagnosis not present

## 2019-02-02 NOTE — Telephone Encounter (Signed)
I returned Dr Redmond Baseman' call. Mr Neault has symptoms and CT sinuses c/w possible chronic sinusitis. Doesn't seem like good surgical candidate due to poor general health at this time, and has had multiple antibiotics. Dr Redmond Baseman will call him to suggest observation and conservative management for now. Pt has appt with me 9/10.

## 2019-02-07 NOTE — Telephone Encounter (Signed)
Dr. Annamaria Boots, please advise if you have already spoken with Dr. Redmond Baseman. Thanks!

## 2019-02-07 NOTE — Telephone Encounter (Signed)
Done

## 2019-02-07 NOTE — Telephone Encounter (Signed)
CY has spoken to Dr. Redmond Baseman and patient has been made aware of this per 12/14/2018 lab result note. Nothing further needed at this time.

## 2019-02-09 ENCOUNTER — Other Ambulatory Visit: Payer: Self-pay

## 2019-02-09 ENCOUNTER — Encounter: Payer: Self-pay | Admitting: Internal Medicine

## 2019-02-09 ENCOUNTER — Ambulatory Visit (INDEPENDENT_AMBULATORY_CARE_PROVIDER_SITE_OTHER): Payer: Medicare HMO

## 2019-02-09 ENCOUNTER — Ambulatory Visit: Payer: Medicare HMO | Admitting: Internal Medicine

## 2019-02-09 VITALS — BP 132/80 | HR 82 | Temp 97.9°F | Ht 73.0 in | Wt 234.2 lb

## 2019-02-09 DIAGNOSIS — J42 Unspecified chronic bronchitis: Secondary | ICD-10-CM | POA: Diagnosis not present

## 2019-02-09 DIAGNOSIS — Z23 Encounter for immunization: Secondary | ICD-10-CM | POA: Diagnosis not present

## 2019-02-09 DIAGNOSIS — J441 Chronic obstructive pulmonary disease with (acute) exacerbation: Secondary | ICD-10-CM

## 2019-02-09 DIAGNOSIS — J40 Bronchitis, not specified as acute or chronic: Secondary | ICD-10-CM | POA: Diagnosis not present

## 2019-02-09 DIAGNOSIS — J320 Chronic maxillary sinusitis: Secondary | ICD-10-CM | POA: Diagnosis not present

## 2019-02-09 MED ORDER — BUDESONIDE-FORMOTEROL FUMARATE 160-4.5 MCG/ACT IN AERO: INHALATION_SPRAY | RESPIRATORY_TRACT | 4 refills | Status: DC

## 2019-02-09 NOTE — Assessment & Plan Note (Signed)
Chronic bronchitis/ bronchiectasis, mildly worse now. Has started antibiotic.  Plan- Flu vax, CXR. No change Rx.

## 2019-02-09 NOTE — Assessment & Plan Note (Signed)
Eval by Dr Redmond Baseman ENT with feeling that potential benefits of surgery now would not be worth the risk to him.  Plan- continue saline nasal rinse

## 2019-02-09 NOTE — Progress Notes (Signed)
HPI M Veteran, Never smoker followed for Bronchiectasis/recurrent pneumonia/Enterobacter due to reflux, complicated by DM, HBP, lung nodule, GERD, ischemic colitis/ multiple surgeries, valvular heart disease Office Spirometry 01/16/16-moderate restriction of exhaled volume, at least mild obstructive airways disease. FVC 3.02/59%, FEV1 2.13/56%, ratio 0.95, FEF 25-75% 1.28/42%  --------------------------------------------------------  04/25/2018- 68 year old male [name], never smoker, followed for Bronchiectasis/ recurrent pneumonia/Enterobacter/recurrent aspiration, complicated by DM, HBP, lung nodule, GERD, ischemic colitis/multiple surgeries, valvular heart disease, ASCVD, AFib  ------6 month follow up for COPD.  Had to see Dr. Johnnette Gourd for epistaxis now controlled. Sleeps nearly upright with pillows or on couch to minimize reflux and cough.  Denies acute respiratory events.  Has had flu shot. CXR 09/24/2017- No acute cardiopulmonary disease. Symbicort 160, Beconase AQ nasal spray,  02/09/2019- 68 year old male [name], never smoker, followed for Bronchiectasis/ recurrent pneumonia/Enterobacter/recurrent aspiration, complicated by DM, HBP, lung nodule, GERD, ischemic colitis/multiple surgeries, valvular heart disease, ASCVD, AFib -----reports nose bleed this morning at 7:30, states his PCP has him back on sulfa antibiotic ( on Eliquis) Saw Dr Redmond Baseman ENT who recommended conservative management of chronic sinusiits with saline rinses. Albuterol neb, albuterol hfa, Symbicort 160, Flutter,  No fever, non-purulent chronic cough.  Reports syncopal episode, found down by wife last week. PCP has evaluated. Being considered for aortic valve repair with repeat Echjo pending 5 months.  R nostril bleed self-limited with compression.  Submitted repeat sputum Cx- pending.   ROS-   += positive Constitutional:   No-   weight loss, night sweats, +fevers, chills, fatigue, lassitude. HEENT:   No-  headaches,  difficulty swallowing, +tooth/dental problems, sore throat,        sneezing, itching, ear ache, nasal congestion, post nasal drip,  CV:      chest pain, +orthopnea, PND, swelling in lower extremities, anasarca, dizziness, +palpitations Resp: + shortness of breath with exertion or at rest.                +productive cough,  No non-productive cough,              +change in color of mucus.  No- wheezing.   Skin: No-   rash or lesions. GI:  No-   heartburn, indigestion, +abdominal pain, nausea, vomiting,  GU:  MS:  +  joint pain or swelling.  Neuro-     nothing unusual Psych:  No- change in mood or affect. + depression or anxiety.  No memory loss  OBJ- Physical Exam    General- Alert, Oriented, + anxious/depressed, Distress- none acute, + overweight Skin- + fungal nail changes right hand and also reported on toes of both feet Lymphadenopathy- none Head- atraumatic            Eyes- Gross vision intact, PERRLA, conjunctivae and secretions clear            Ears- Hearing, canals-normal            Nose-  no-Septal dev,polyps, erosion, perforation             Throat- Mallampati II , mucosa clear , drainage- none, tonsils- atrophic. + Missing teeth,  Neck- flexible , trachea midline, no stridor , thyroid nl, carotid no bruit Chest - symmetrical excursion , unlabored           Heart/CV- RRR ,  murmur + 2/6 S aortic , no gallop  , no rub, nl s1 s2                           -  JVD- none , edema+1/ support hose, stasis changes- none, varices- none           Lung-+ coarse, unlabored, cough+ rough/ wheezy,                   dullness-none, rub- none           Chest wall-  Abd-  Br/ Gen/ Rectal- Not done, not indicated Extrem-  +cane,  Neuro- grossly intact to observation

## 2019-02-09 NOTE — Patient Instructions (Signed)
Order- CXR    Dx chronic bronchitis  Order- Flu vax senior

## 2019-02-13 ENCOUNTER — Telehealth: Payer: Self-pay | Admitting: Internal Medicine

## 2019-02-13 NOTE — Telephone Encounter (Signed)
Advised pt of results. Pt understood and nothing further is needed.    Notes recorded by Deneise Lever, MD on 02/10/2019 at 3:36 PM EDT  CXR- stable with no new process seen.

## 2019-02-14 ENCOUNTER — Telehealth: Payer: Self-pay | Admitting: Cardiology

## 2019-02-14 NOTE — Telephone Encounter (Signed)
Patient was scheduled as a virtual visit with me for evaluation possible stroke.  I contacted the patient as scheduled appointment would not be satisfactory to evaluate someone's mental status.  Talking with the patient, he fell 2 weeks ago and then subsequently had intermittent episode where he is unable to respond to questions.  His symptom is concerning for stroke, and I asked him to go to the emergency room which he has refused.  He says he talked to his physician at [facility] hospital in the past 2 weeks who ordered a head CT however he did not get approved with insurance.  He is talking to his hand doctor in order to see if they can order head CT.   He was originally scheduled with me as a virtual visit tomorrow, however I will not be able to do a neuro exam across the phone.  I discussed with him for 20 minutes regarding signs and symptoms of stroke, I am quite concerned about his symptom, and I recommend he go to the emergency room.  However he does not seems to willing to go at this time.  He is mental status has returned to his baseline at this point.  My concern is since he is on the blood thinner and has not missed any doses, he could have a brain bleed on the blood thinner.  Still he refused to go despite knowing the risk of potential death.  He says if the symptom recurs again, then he will go to the emergency room.  One of the main reason he did not wish to go to the ED at this point is potential call the case is over there, however advised him that it is still the safest place if he is having a stroke or subdural hemorrhage.   We have tentatively canceled his virtual visit for tomorrow however I talked to one of my colleague who will try to see him in the office on Thursday.  Ideally, this should have been seen by neurology, however he does not have a neurology follow-up.  His physician at [facility] recommended him to get an early visit with cardiology however this is not exactly a cardiac issue.  It  is unclear to me why his head CT did not get approved by insurance.

## 2019-02-14 NOTE — Telephone Encounter (Signed)
LM2CB 9/15 9:00am

## 2019-02-14 NOTE — Telephone Encounter (Signed)
New message   Per patient's wife he seems like the patient is confused. Patient has nosebleed. Please call to discuss patient's symptoms.

## 2019-02-15 ENCOUNTER — Telehealth: Payer: Medicare HMO | Admitting: Physician Assistant

## 2019-02-15 ENCOUNTER — Ambulatory Visit
Admission: RE | Admit: 2019-02-15 | Discharge: 2019-02-15 | Disposition: A | Discharge status: Home / Self Care | Payer: Medicare HMO | Source: Ambulatory Visit | Attending: Family Medicine | Admitting: Family Medicine

## 2019-02-15 ENCOUNTER — Ambulatory Visit: Payer: Medicare HMO | Admitting: Cardiovascular Disease

## 2019-02-15 DIAGNOSIS — R413 Other amnesia: Secondary | ICD-10-CM | POA: Diagnosis not present

## 2019-02-15 DIAGNOSIS — S0990XA Unspecified injury of head, initial encounter: Secondary | ICD-10-CM

## 2019-02-16 ENCOUNTER — Ambulatory Visit: Payer: Medicare HMO | Admitting: Medical

## 2019-02-17 DIAGNOSIS — G894 Chronic pain syndrome: Secondary | ICD-10-CM | POA: Diagnosis not present

## 2019-02-17 DIAGNOSIS — Z79891 Long term (current) use of opiate analgesic: Secondary | ICD-10-CM | POA: Diagnosis not present

## 2019-02-17 DIAGNOSIS — M5136 Other intervertebral disc degeneration, lumbar region: Secondary | ICD-10-CM | POA: Diagnosis not present

## 2019-02-17 DIAGNOSIS — M25562 Pain in left knee: Secondary | ICD-10-CM | POA: Diagnosis not present

## 2019-03-06 ENCOUNTER — Ambulatory Visit: Payer: Medicare HMO | Admitting: Medical

## 2019-03-17 DIAGNOSIS — M5136 Other intervertebral disc degeneration, lumbar region: Secondary | ICD-10-CM | POA: Diagnosis not present

## 2019-03-17 DIAGNOSIS — Z79891 Long term (current) use of opiate analgesic: Secondary | ICD-10-CM | POA: Diagnosis not present

## 2019-03-17 DIAGNOSIS — M25562 Pain in left knee: Secondary | ICD-10-CM | POA: Diagnosis not present

## 2019-03-17 DIAGNOSIS — G894 Chronic pain syndrome: Secondary | ICD-10-CM | POA: Diagnosis not present

## 2019-04-14 DIAGNOSIS — M5136 Other intervertebral disc degeneration, lumbar region: Secondary | ICD-10-CM | POA: Diagnosis not present

## 2019-04-14 DIAGNOSIS — G894 Chronic pain syndrome: Secondary | ICD-10-CM | POA: Diagnosis not present

## 2019-04-14 DIAGNOSIS — Z79891 Long term (current) use of opiate analgesic: Secondary | ICD-10-CM | POA: Diagnosis not present

## 2019-04-14 DIAGNOSIS — M25562 Pain in left knee: Secondary | ICD-10-CM | POA: Diagnosis not present

## 2019-05-12 DIAGNOSIS — R55 Syncope and collapse: Secondary | ICD-10-CM | POA: Diagnosis not present

## 2019-05-12 DIAGNOSIS — J329 Chronic sinusitis, unspecified: Secondary | ICD-10-CM | POA: Diagnosis not present

## 2019-05-12 DIAGNOSIS — R4182 Altered mental status, unspecified: Secondary | ICD-10-CM | POA: Diagnosis not present

## 2019-05-12 DIAGNOSIS — J441 Chronic obstructive pulmonary disease with (acute) exacerbation: Secondary | ICD-10-CM | POA: Diagnosis not present

## 2019-05-19 DIAGNOSIS — M25562 Pain in left knee: Secondary | ICD-10-CM | POA: Diagnosis not present

## 2019-05-19 DIAGNOSIS — M5136 Other intervertebral disc degeneration, lumbar region: Secondary | ICD-10-CM | POA: Diagnosis not present

## 2019-05-19 DIAGNOSIS — G894 Chronic pain syndrome: Secondary | ICD-10-CM | POA: Diagnosis not present

## 2019-05-19 DIAGNOSIS — Z79891 Long term (current) use of opiate analgesic: Secondary | ICD-10-CM | POA: Diagnosis not present

## 2019-06-07 ENCOUNTER — Ambulatory Visit (HOSPITAL_COMMUNITY): Payer: Medicare HMO | Attending: Cardiovascular Disease

## 2019-06-07 ENCOUNTER — Other Ambulatory Visit: Payer: Self-pay

## 2019-06-07 DIAGNOSIS — I110 Hypertensive heart disease with heart failure: Secondary | ICD-10-CM

## 2019-06-07 DIAGNOSIS — I5032 Chronic diastolic (congestive) heart failure: Secondary | ICD-10-CM | POA: Diagnosis not present

## 2019-06-07 DIAGNOSIS — I9789 Other postprocedural complications and disorders of the circulatory system, not elsewhere classified: Secondary | ICD-10-CM | POA: Diagnosis not present

## 2019-06-07 DIAGNOSIS — I4891 Unspecified atrial fibrillation: Secondary | ICD-10-CM | POA: Diagnosis not present

## 2019-06-07 DIAGNOSIS — R0600 Dyspnea, unspecified: Secondary | ICD-10-CM

## 2019-06-07 DIAGNOSIS — R0609 Other forms of dyspnea: Secondary | ICD-10-CM

## 2019-06-07 HISTORY — PX: TRANSTHORACIC ECHOCARDIOGRAM: SHX275

## 2019-06-09 ENCOUNTER — Telehealth: Payer: Self-pay | Admitting: *Deleted

## 2019-06-09 MED ORDER — NITROGLYCERIN 0.4 MG SL SUBL: 0.4000 mg | SUBLINGUAL_TABLET | SUBLINGUAL | 4 refills | Status: DC | PRN

## 2019-06-09 NOTE — Telephone Encounter (Signed)
-----   Message from Leonie Man, MD sent at 06/08/2019  1:16 PM EST ----- Echocardiogram result shows some progression of the aortic valve stenosis.  Still in the severe range, not yet critical, but the mean gradient has gone up to 49 from 41 mmHg.  Thankfully, the pump function still looks well-preserved.  Overall there is not that much change from last study.  We can discuss plans going forward, but probably within the year of 2021 we would be considering referral to discuss valve replacement which would probably be the percutaneous (catheter-based) valve replacement called TAVR.   [name], MD

## 2019-06-09 NOTE — Telephone Encounter (Signed)
The patient has been notified of the result and verbalized understanding.  All questions (if any) were answered. Schedule recall for 06/29/19  Requested rx for NTG,  available for pickup  Taking to the [facility] Raiford Simmonds, RN 06/09/2019 10:21 AM

## 2019-06-19 DIAGNOSIS — Z79891 Long term (current) use of opiate analgesic: Secondary | ICD-10-CM | POA: Diagnosis not present

## 2019-06-19 DIAGNOSIS — M25562 Pain in left knee: Secondary | ICD-10-CM | POA: Diagnosis not present

## 2019-06-19 DIAGNOSIS — M5136 Other intervertebral disc degeneration, lumbar region: Secondary | ICD-10-CM | POA: Diagnosis not present

## 2019-06-19 DIAGNOSIS — G894 Chronic pain syndrome: Secondary | ICD-10-CM | POA: Diagnosis not present

## 2019-06-27 DIAGNOSIS — R0789 Other chest pain: Secondary | ICD-10-CM | POA: Diagnosis not present

## 2019-06-27 DIAGNOSIS — I491 Atrial premature depolarization: Secondary | ICD-10-CM | POA: Diagnosis not present

## 2019-06-27 DIAGNOSIS — R079 Chest pain, unspecified: Secondary | ICD-10-CM | POA: Diagnosis not present

## 2019-06-29 ENCOUNTER — Encounter: Payer: Self-pay | Admitting: Cardiology

## 2019-06-29 ENCOUNTER — Ambulatory Visit: Payer: Medicare HMO | Admitting: Cardiology

## 2019-06-29 ENCOUNTER — Other Ambulatory Visit: Payer: Self-pay

## 2019-06-29 VITALS — BP 177/96 | HR 87 | Ht 73.5 in | Wt 241.0 lb

## 2019-06-29 DIAGNOSIS — E1169 Type 2 diabetes mellitus with other specified complication: Secondary | ICD-10-CM | POA: Diagnosis not present

## 2019-06-29 DIAGNOSIS — I110 Hypertensive heart disease with heart failure: Secondary | ICD-10-CM | POA: Diagnosis not present

## 2019-06-29 DIAGNOSIS — E785 Hyperlipidemia, unspecified: Secondary | ICD-10-CM

## 2019-06-29 DIAGNOSIS — R55 Syncope and collapse: Secondary | ICD-10-CM | POA: Insufficient documentation

## 2019-06-29 DIAGNOSIS — R0600 Dyspnea, unspecified: Secondary | ICD-10-CM

## 2019-06-29 DIAGNOSIS — I5032 Chronic diastolic (congestive) heart failure: Secondary | ICD-10-CM

## 2019-06-29 DIAGNOSIS — Z01810 Encounter for preprocedural cardiovascular examination: Secondary | ICD-10-CM | POA: Diagnosis not present

## 2019-06-29 DIAGNOSIS — I9789 Other postprocedural complications and disorders of the circulatory system, not elsewhere classified: Secondary | ICD-10-CM

## 2019-06-29 DIAGNOSIS — I4891 Unspecified atrial fibrillation: Secondary | ICD-10-CM

## 2019-06-29 DIAGNOSIS — J449 Chronic obstructive pulmonary disease, unspecified: Secondary | ICD-10-CM

## 2019-06-29 DIAGNOSIS — I350 Nonrheumatic aortic (valve) stenosis: Secondary | ICD-10-CM | POA: Diagnosis not present

## 2019-06-29 DIAGNOSIS — R69 Illness, unspecified: Secondary | ICD-10-CM | POA: Diagnosis not present

## 2019-06-29 DIAGNOSIS — Z9182 Personal history of military deployment: Secondary | ICD-10-CM | POA: Insufficient documentation

## 2019-06-29 DIAGNOSIS — R0609 Other forms of dyspnea: Secondary | ICD-10-CM

## 2019-06-29 DIAGNOSIS — F4312 Post-traumatic stress disorder, chronic: Secondary | ICD-10-CM

## 2019-06-29 MED ORDER — PREDNISONE 50 MG PO TABS: ORAL_TABLET | ORAL | 3 refills | Status: DC

## 2019-06-29 NOTE — H&P (View-Only) (Signed)
Primary Care Provider: Hayden Rasmussen, MD Cardiologist: [name], MD Electrophysiologist: None  Clinic Note: Chief Complaint  Patient presents with  . Follow-up    Echo results  . Aortic Stenosis    HPI:    [name] is a 69 y.o. male with a PMH below who presents today for 27-monthfollow-up to discuss results of his recent echocardiogram showing more progression of aortic valve disease..[name] PMH notable for:  HTN (with Hypertensive Heart Disease / HFPEF - on Lasix & support socks), mod-Severe AS, carotid artery disease, COPD (along with multiple other complications related to Agent Orange), multiple GI surgeries related to war injuries. . --> most recent cardiac Dx PAF (was post-op from Knee Sgx) --> converted with Amiodarone gtt, started on Xarelto.  He has had 3 cardiac catheterizations - all with minimal CAD. -- most recent R&LHC 09/2016 - angiographically normal tortuous coronaries, mild Pulm HTN with severely elevated LVEDP & systemic HTN.    Now severe AS with mean gradient up to 49 mmHg.  William NAMARRI SATTERLYwas last seen in July 2020.  He was doing relatively well from a cardiac standpoint.  Still having short bursts of maybe 5-10 beats of irregular heartbeats but not necessarily prolonged.  He is he is decided against any further orthopedic surgeries on his knee or shoulder.  Was noting off-and-on episodes of orthopnea and shortness of breath with walking up hills but his walking is very limited by knee pain.  Intermittently has episodes of prolonged edema requiring extra Lasix but as of this visit was barely taking any additional doses.  Had noted several episodes of "walking pneumonia ".    Recent Hospitalizations:   None  Reviewed  CV studies:    The following studies were reviewed today: (if available, images/films reviewed: From Epic Chart or Care Everywhere) . 06/07/2019 TTE: Severe calcific aortic stenosis-mean gradient up to 49 mmHg the peak of 79  mmHg.Mild-Mod AI.  Normal LV size and function.  EF 65 to 70%.  Mild concentric LVH with GR 1 DD.  Biatrial size-normal.  Moderate mitral valve calcification with mild MAC and mild MR -> compared to June 2020-increase in both gradients from mean 42 mmHg and peak 66 mmHg   Interval History:   DPAULETTE LYNCHis here today with his wife POlin Hauseractually pretty stable from a cardiac standpoint.  He had a recent bout of cellulitis in his left leg about a week ago and is finished his antibiotics.  Swelling is better.  He is taking a couple of extra dose of Lasix.  They have various types of support stockings and TED hose that they use depending on how much swelling he has.  For the most part the swelling is pretty well controlled except that one episode of cellulitis. His blood pressure has been up a bit of late, but is also had readings as low as 110/70 9 mmHg.  He still notes intermittent episodes of irregular heartbeats that can last a few minutes here and there.  He is wife have purchased the KBryantmonitor, but are not yet got hooked up.  He still sleeps somewhat in recliner more because of comfort than anything else.  About couple months ago he had a fall while in the bathroom.  He presumably the loss of consciousness.  Not really sure what truly happened because he could not tell me much and his wife only knew that she walked in and he was on the ground.  He does not recall any sensation of irregular heartbeat at that time nor any chest pain or pressure.  What he notes now is that he has more notable exertional dyspnea.  He sleeps in a recliner to keep his legs elevated but also because of orthopnea and PND.  Actually is been noticing more prominent flashbacks and nightmares of late.[name]  He actually had a nosebleed from his right nostril during the evaluation.  CV Review of Symptoms (Summary): positive for - dyspnea on exertion, edema, irregular heartbeat, loss of consciousness, orthopnea, palpitations,  paroxysmal nocturnal dyspnea and rapid heart rate negative for - chest pain or TIA/amaurosis fugax.  The patient does not have symptoms concerning for COVID-19 infection (fever, chills, cough, or new shortness of breath).  The patient is practicing social distancing & Masking.    REVIEWED OF SYSTEMS   A comprehensive ROS was performed. ROS   I have reviewed and (if needed) personally updated the patient's problem list, medications, allergies, past medical and surgical history, social and family history.   PAST MEDICAL HISTORY   Past Medical History:  Diagnosis Date  . Anemia   . Asthma   . Back pain, chronic, followed at pain clinic 11/25/2011  . Carotid artery stenosis 09/06/2014   By Dopplers in September 2017: Right ICA 40-59%.  Less than 40% left ICA.  [name] Chest pain with low risk for cardiac etiology 10/2011   Non-obstructive CAD by Cath; negative Lexiscan in 08/2011  . Clotting disorder (HCC)    LLE DVT (also small vessel)  . Colon polyps   . COPD (chronic obstructive pulmonary disease) ([place])   . Dementia ([place])    Mild  . Depression   . Dyslipidemia (high LDL; low HDL)    statin intolerant; on fibrate  . Edema leg from Venous Stasis    Venous stasis :wears compression stockings; 08/2012 dopplers - no DVT or thrombophlebitis; mild R Popliteal V reflux - no VNUS ablation candidate  . Fainting   . Family history of acute myocardial infarction. and premature CAD 11/25/2011  . GERD (gastroesophageal reflux disease)   . Hearing loss   . History of diabetes mellitus   . Hypertension    very labile  . Nephrolithiasis   . Osteoarthritis   . Other idiopathic peripheral autonomic neuropathy    Agent orange  . PTSD (post-traumatic stress disorder) 07/24/2015   per patient approach from foot of bed to awake; do not apply any contricting pressure, also avoid approaching from behind with any loud noises   . Seizure ([place])   . Severe aortic stenosis by prior echocardiogram 10/2018    EF greater than 65%.  Moderate concentric LVH.  01 DD.  Mild LA.  Now AS mean gradient 41 mmHg, peak gradient 65 progression from moderate to now severe)  . Small bowel obstruction ([place]) 1990s, 2001, 2015   s/p multiple bowel surgeries; from war wounds  . Stroke [facility])     PAST SURGICAL HISTORY   Past Surgical History:  Procedure Laterality Date  . APPENDECTOMY  1958   per patient  . Carotid Dopplers  01/2016   Stable. RICA - slight progression from<40% to 40-59%.  Otherwise stable bilateral carotids and subclavian arteries. Stable LICA  . COLONOSCOPY    . DENTAL SURGERY  01/14/2017   5 extractions in preparation for LTKA on 01-25-17; also was started on amoxicillin x 7days; has since completed therapy   . exploratory laparotomy with extensive lysis of adhesions  10/1999  . JOINT  REPLACEMENT     right knee replaced 2x  . LEFT HEART CATHETERIZATION WITH CORONARY ANGIOGRAM N/A 11/26/2011   WNL Lorretta Harp, MD  . NM MYOVIEW LTD  08/2011   Negative lexiscan myoview: No ischemia or infarction  . RIGHT/LEFT HEART CATH AND CORONARY ANGIOGRAPHY N/A 10/22/2016   Procedure: Right/Left Heart Cath and Coronary Angiography;  Surgeon: Leonie Man, MD;  Location: [facility] INVASIVE CV LAB:: Angiographically normal/minimal CAD with tortuous coronary arteries.  Mild pulmonary pretension secondary to severely elevated LVEDP and systemic hypertension.  Mild-moderate aortic valve stenosis with mean gradient 21 mmHg.  [name] small bowel obstruction  1996, 1999, 2001, 2015  . TONSILLECTOMY  1960   per patient  . TOTAL KNEE ARTHROPLASTY    . TOTAL KNEE ARTHROPLASTY Left 01/25/2017   Procedure: LEFT TOTAL KNEE ARTHROPLASTY;  Surgeon: Gaynelle Arabian, MD;  Location: [facility];  Service: Orthopedics;  Laterality: Left;  Adductor Block  . TRANSTHORACIC ECHOCARDIOGRAM  11/03/2018   (progression of AS - severe - Mean Gradient 41 mmHg, Peak 65 mmHg).  EF >65%. Mod Conc LVH - Gr 1 DD. Mild LA dilation.   . TRANSTHORACIC  ECHOCARDIOGRAM  01/2017   a) 2018: Moderate LVH.  EF 65-70%.  No RW MA.  Mod AS (mean gradient 25 mmHg -notable progression since 2013).;; b) 01/2018:  Mild LVH.  EF 60 to 65%.  GR 1 DD.  Moderate to severe aortic stenosis.  Mean gradient 36 mmHg.  Peak 65 mmHg. -->  This does show progression from last year.  (Plan to follow-up echo March 2020)  . TRANSTHORACIC ECHOCARDIOGRAM  06/07/2019    Severe calcific aortic stenosis-mean gradient up to 49 mmHg the peak of 79 mmHg.Mild-Mod AI.  Normal LV size and function.  EF 65 to 70%.  Mild concentric LVH with GR 1 DD.  Biatrial size-normal.  Moderate mitral valve calcification with mild MAC and mild MR -> compared to June 2020-increase in both gradients from mean 42 mmHg and peak 66 mmHg    MEDICATIONS/ALLERGIES   Current Meds  Medication Sig  . albuterol (ACCUNEB) 0.63 MG/3ML nebulizer solution Take 1 ampule by nebulization every 6 (six) hours as needed for wheezing or shortness of breath.   . Albuterol Sulfate 108 (90 Base) MCG/ACT AEPB Inhale into the lungs.  . ALPRAZolam (XANAX) 1 MG tablet Take 1 tablet (1 mg total) by mouth 3 (three) times daily as needed for anxiety or sleep. (Patient taking differently: Take 1-2 mg by mouth every 8 (eight) hours as needed (for anxiety/PTSD). )  . apixaban (ELIQUIS) 5 MG TABS tablet Take 1 tablet (5 mg total) by mouth 2 (two) times daily.  . beclomethasone (BECONASE-AQ) 42 MCG/SPRAY nasal spray Place 1 spray into both nostrils daily. Dose is for each nostril.  . budesonide-formoterol (SYMBICORT) 160-4.5 MCG/ACT inhaler INHALE 2 PUFFS INTO THE LUNGS TWICE A DAY RINSE MOUTH WELL AFTER USE  . carvedilol (COREG) 25 MG tablet Take 1 tablet (25 mg total) by mouth 2 (two) times daily.  . fluticasone (FLONASE) 50 MCG/ACT nasal spray Place 1 spray into both nostrils daily.  . furosemide (LASIX) 20 MG tablet Take 1 tablet (20 mg total) by mouth daily. MAY TAKE ADDITIONAL TABLET AS NEEDED SWELLING OR SHORTNESS OF BREATH  .  irbesartan (AVAPRO) 300 MG tablet TAKE 1 TABLET BY MOUTH EVERY DAY  . morphine (MSIR) 15 MG tablet TK 1 T PO Q 4 H PRN P  . nitroGLYCERIN (NITROSTAT) 0.4 MG SL tablet Place 1 tablet (  0.4 mg total) under the tongue every 5 (five) minutes as needed for chest pain.  . polyethylene glycol (MIRALAX / GLYCOLAX) packet Take 17 g by mouth 2 (two) times daily as needed for moderate constipation.   [name] Respiratory Therapy Supplies (FLUTTER) DEVI Use as directed  . rosuvastatin (CRESTOR) 10 MG tablet TAKE 1 TABLET BY MOUTH EVERY DAY    Allergies  Allergen Reactions  . Bee Venom Anaphylaxis  . Codeine Shortness Of Breath and Rash  . Doxycycline Shortness Of Breath and Rash  . Ivp Dye [Iodinated Diagnostic Agents] Shortness Of Breath    Lost consciousness/difficulty breathing Omni - Paque Contrast   . Levaquin [Levofloxacin] Hives and Shortness Of Breath  . Oxycodone Hcl Shortness Of Breath, Swelling and Rash  . Stadol [Butorphanol] Shortness Of Breath  . Succinylcholine Chloride Shortness Of Breath, Nausea Only, Rash and Other (See Comments)    Difficulty breathing   . Atorvastatin     myalgias  . Bystolic [Nebivolol Hcl] Other (See Comments)    Nightmares, flashbacks (PTSD)  . Iohexol      Desc: hives,neck and torso erythemia   . Methocarbamol Nausea Only    rash  . Pentazocine Lactate     rash  . Tamiflu [Oseltamivir Phosphate]   . Dilaudid [Hydromorphone Hcl] Nausea Only and Rash    Aggressive   . Synvisc [Hylan G-F 20] Hives and Rash    SOCIAL HISTORY/FAMILY HISTORY   Social History   Tobacco Use  . Smoking status: Never Smoker  . Smokeless tobacco: Former Network engineer Use Topics  . Alcohol use: Yes    Comment: occasional beer   . Drug use: No   Social History   Social History Narrative   He is a father of 9, grandfather 73.    One of his sons was killed during the war in [place]. Grandson killed bike accident 2017.   He was exposed to [employer].   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   Not very physically active due to extreme disability/debilitation from osteoarthritis and peripheral neuropathy.    Family History family history includes Heart attack in his brother, father, mother, sister, and son; Heart disease in his father and mother; Hypertension in his brother and brother; Non-Hodgkin's lymphoma in his daughter; Sleep apnea in his son; Stroke in his mother and son.  OBJCTIVE -PE, EKG, labs   Wt Readings from Last 3 Encounters:  06/29/19 241 lb (109.3 kg)  02/09/19 234 lb 3.2 oz (106.2 kg)  12/15/18 241 lb (109.3 kg)    Physical Exam: BP (!) 177/96   Pulse 87   Ht 6' 1.5" (1.867 m)   Wt 241 lb (109.3 kg)   SpO2 100%   BMI 31.36 kg/m  Physical Exam  Constitutional: He is oriented to person, place, and time. He appears well-developed and well-nourished.  HENT:  Head: Normocephalic and atraumatic.  Eyes: Pupils are equal, round, and reactive to light. EOM are normal.  Neck: Decreased carotid pulses present. No hepatojugular reflux and no JVD present. Carotid bruit is not present (Radiated aortic murmur).  Cardiovascular: Normal rate, regular rhythm, S1 normal and S2 normal.  Occasional extrasystoles are present. PMI is not displaced. Exam reveals distant heart sounds and decreased pulses (Both pedal pulses are decreased, but palpable.). Exam reveals no gallop and no friction rub.  Murmur heard. High-pitched harsh crescendo-decrescendo mid to late systolic murmur is present with a grade  of 4/6 at the upper right sternal border radiating to the neck. Pulmonary/Chest: Effort normal. No respiratory distress. He has wheezes. He has no rales. He exhibits no tenderness.  Diffuse interstitial sounds with rhonchi and crackles, no rales.  Expiratory wheeze  Abdominal: Soft. Bowel sounds are normal. He exhibits no distension. There is no abdominal tenderness. There is no rebound.    Musculoskeletal:        General: Edema (About 1-2+ bilateral lower extremities from the mid shin down mostly in ankles.  Pretty stable) present.     Comments: Normal range of motion, very stiff joints.  Walks with a slow steady antalgic gait  Neurological: He is alert and oriented to person, place, and time. No cranial nerve deficit.  Skin: Skin is warm and dry.  Mild erythema along the left ankle has some maculopapular red rash on both feet across the toes as well as left hand.  Has been told that this is related to agent orange  Psychiatric: He has a normal mood and affect. His behavior is normal. Judgment and thought content normal.  Vitals reviewed.    Adult ECG Report Not checked  Recent Labs:   Lab Results  Component Value Date   CHOL 158 06/15/2018   HDL 47 06/15/2018   LDLCALC 76 06/15/2018   LDLDIRECT 66.4 02/02/2007   TRIG 174 (H) 06/15/2018   CHOLHDL 3.4 06/15/2018   Lab Results  Component Value Date   CREATININE 0.71 02/09/2017   BUN 8 02/09/2017   NA 133 (L) 02/09/2017   K 3.5 02/09/2017   CL 102 02/09/2017   CO2 21 (L) 02/09/2017   Lab Results  Component Value Date   TSH 4.30 10/15/2016    ASSESSMENT/PLAN    Problem List Items Addressed This Visit    Hypertensive heart disease with chronic diastolic congestive heart failure (HCC) - Primary (Chronic)    Labile blood pressure.  He is on max dose carvedilol and irbesartan.  With labile renal function, reluctant to use too much in the way of diuretic.  Have not used amlodipine because of edema.  Going forward, we will probably consider using as needed hydralazine      Relevant Orders   Basic metabolic panel   CBC   Preop cardiovascular exam (Chronic)    He definitely will need TAVR, but as part of that evaluation we will do a right left heart catheterization.      Hyperlipidemia associated with type 2 diabetes mellitus (HCC) (Chronic)   Symptomatic severe aortic stenosis with normal ejection  fraction (Chronic)    Significant progression of disease over the last year with now mean gradient of 49 mmHg.  Clearly there is significant calcified aortic sclerosis.  I am concerned that he is having progressive exertional dyspnea but also had that one episode of possible syncope.  At this point I think is time to move forward with evaluation for TAVR.  We will set up right left heart catheterization as part of initial work-up and refer him to the heart valve clinic.      Postoperative atrial fibrillation [facility]):  CHA2DS2-VASc Score 6 (Chronic)    Continues to note intermittent bursts of irregular heartbeats, we have never documented A. fib since his previous surgery.  They do have the home event monitor to record for possible arrhythmias.  He is on Eliquis and high-dose carvedilol.  If we are able to document any evidence of A. fib, will may consider arrhythmia.  DOE (dyspnea on exertion) (Chronic)    Certainly this is multifactorial with his lung disease disease definitely has abnormal lung exam however cannot exclude the fact that his valve being worse is playing a role as well.  We will proceed with right and left heart catheterization prior to TAVR evaluation.      COPD mixed type ([place]) (Chronic)    Being managed by pulmonary medicine.      Relevant Medications   predniSONE (DELTASONE) 50 MG tablet   Chronic post-traumatic stress disorder (PTSD) 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 (Chronic)    Significant amount of nightmares and flashbacks over the last year.      Syncope and collapse   Relevant Orders   Basic metabolic panel   CBC     Performing MD:  [name], M.D., M.S.  Procedure: RIGHT AND LEFT HEART CATHETERIZATION WITH CORONARY ANGIOGRAPHY  The procedure with Risks/Benefits/Alternatives and Indications was reviewed with the patient and his wife.  All questions were answered.    Risks / Complications include, but not limited to: Death, MI, CVA/TIA, VF/VT (with  defibrillation), Bradycardia (need for temporary pacer placement), contrast induced nephropathy, bleeding / bruising / hematoma / pseudoaneurysm, vascular or coronary injury (with possible emergent CT or Vascular Surgery), adverse medication reactions, infection.  Additional risks involving the use of radiation with the possibility of radiation burns and cancer were explained in detail.  The patient (and family) voice understanding and agree to proceed.     COVID-19 Education: The signs and symptoms of COVID-19 were discussed with the patient and how to seek care for testing (follow up with PCP or arrange E-visit).   The importance of social distancing was discussed today.  I spent a total of 52 minutes with the patient. >  50% of the time was spent in direct patient consultation.  Additional time spent with chart review  / charting (studies, outside notes, etc): 16 Total Time: 68 min   Current medicines are reviewed at length with the patient today.  (+/- concerns) none   Patient Instructions / Medication Changes & Studies & Tests Ordered   Patient Instructions  Medication Instructions:   WILL NEED MEDICATION FOR  CATH - PROPHYLACTIC  DYE ALLEGIES   *If you need a refill on your cardiac medications before your next appointment, please call your pharmacy*  Lab Work:   CBC BMP  COVID  TEST ON  Jul 10, 2019  [place]  If you have labs (blood work) drawn today and your tests are completely normal, you will receive your results only by: [name] MyChart Message (if you have MyChart) OR . A paper copy in the mail If you have any lab test that is abnormal or we need to change your treatment, we will call you to review the results.  Testing/Procedures:  Your physician has requested that you have a  Right and left  cardiac catheterization. Cardiac catheterization is used to diagnose and/or treat various heart conditions. Doctors may recommend this procedure for a number of different reasons.  The most common reason is to evaluate chest pain. Chest pain can be a symptom of coronary artery disease (CAD), and cardiac catheterization can show whether plaque is narrowing or blocking your heart's arteries. This procedure is also used to evaluate the valves, as well as measure the blood flow and oxygen levels in different parts of your heart. For further information please visit [email or web address][facility][email or web address]. Please follow instruction sheet, as given.   Follow-Up: At [facility], you and your  health needs are our priority.  As part of our continuing mission to provide you with exceptional heart care, we have created designated Provider Care Teams.  These Care Teams include your primary Cardiologist (physician) and Advanced Practice Providers (APPs -  Physician Assistants and Nurse Practitioners) who all work together to provide you with the care you need, when you need it.  Your next appointment:   3 week(s)  The format for your next appointment:   In Person  Provider:   Jory Sims, DNP, ANP  Other Instructions   Your physician recommends that you schedule a follow-up appointment  TAVR  [place].  Studies Ordered:   Orders Placed This Encounter  Procedures  . Basic metabolic panel  . CBC     [name], M.D., M.S. Interventional Cardiologist   Pager # [phone] Phone # [phone] [address] [place], [place] [address]   Thank you for choosing Heartcare at [facility]!!

## 2019-06-29 NOTE — Progress Notes (Signed)
Primary Care Provider: Hayden Rasmussen, MD Cardiologist: William Hew, MD Electrophysiologist: None  Clinic Note: Chief Complaint  Patient presents with   Follow-up    Echo results   Aortic Stenosis    HPI:    [name] is a 69 y.o. male with a PMH below who presents today for 44-monthfollow-up to discuss results of his recent echocardiogram showing more progression of aortic valve disease..[name] PMH notable for:  HTN (with Hypertensive Heart Disease / HFPEF - on Lasix & support socks), mod-Severe AS, carotid artery disease, COPD (along with multiple other complications related to Agent Orange), multiple GI surgeries related to war injuries. . --> most recent cardiac Dx PAF (was post-op from Knee Sgx) --> converted with Amiodarone gtt, started on Xarelto.  He has had 3 cardiac catheterizations - all with minimal CAD. -- most recent R&LHC 09/2016 - angiographically normal tortuous coronaries, mild Pulm HTN with severely elevated LVEDP & systemic HTN.    Now severe AS with mean gradient up to 49 mmHg.  William NREMBERTO LIENHARDwas last seen in July 2020.  He was doing relatively well from a cardiac standpoint.  Still having short bursts of maybe 5-10 beats of irregular heartbeats but not necessarily prolonged.  He is he is decided against any further orthopedic surgeries on his knee or shoulder.  Was noting off-and-on episodes of orthopnea and shortness of breath with walking up hills but his walking is very limited by knee pain.  Intermittently has episodes of prolonged edema requiring extra Lasix but as of this visit was barely taking any additional doses.  Had noted several episodes of "walking pneumonia ".    Recent Hospitalizations:   None  Reviewed  CV studies:    The following studies were reviewed today: (if available, images/films reviewed: From Epic Chart or Care Everywhere)  06/07/2019 TTE: Severe calcific aortic stenosis-mean gradient up to 49 mmHg the peak of 79  mmHg.Mild-Mod AI.  Normal LV size and function.  EF 65 to 70%.  Mild concentric LVH with GR 1 DD.  Biatrial size-normal.  Moderate mitral valve calcification with mild MAC and mild MR -> compared to June 2020-increase in both gradients from mean 42 mmHg and peak 66 mmHg   Interval History:   William HACKis here today with his wife William Hauseractually pretty stable from a cardiac standpoint.  He had a recent bout of cellulitis in his left leg about a week ago and is finished his antibiotics.  Swelling is better.  He is taking a couple of extra dose of Lasix.  They have various types of support stockings and TED hose that they use depending on how much swelling he has.  For the most part the swelling is pretty well controlled except that one episode of cellulitis. His blood pressure has been up a bit of late, but is also had readings as low as 110/70 9 mmHg.  He still notes intermittent episodes of irregular heartbeats that can last a few minutes here and there.  He is wife have purchased the KPoncamonitor, but are not yet got hooked up.  He still sleeps somewhat in recliner more because of comfort than anything else.  About couple months ago he had a fall while in the bathroom.  He presumably the loss of consciousness.  Not really sure what truly happened because he could not tell me much and his wife only knew that she walked in and he was on the ground.  He does not recall any sensation of irregular heartbeat at that time nor any chest pain or pressure.  What he notes now is that he has more notable exertional dyspnea.  He sleeps in a recliner to keep his legs elevated but also because of orthopnea and PND.  Actually is been noticing more prominent flashbacks and nightmares of late.[name]  He actually had a nosebleed from his right nostril during the evaluation.  CV Review of Symptoms (Summary): positive for - dyspnea on exertion, edema, irregular heartbeat, loss of consciousness, orthopnea, palpitations,  paroxysmal nocturnal dyspnea and rapid heart rate negative for - chest pain or TIA/amaurosis fugax.  The patient does not have symptoms concerning for COVID-19 infection (fever, chills, cough, or new shortness of breath).  The patient is practicing social distancing & Masking.    REVIEWED OF SYSTEMS   A comprehensive ROS was performed. ROS   I have reviewed and (if needed) personally updated the patient's problem list, medications, allergies, past medical and surgical history, social and family history.   PAST MEDICAL HISTORY   Past Medical History:  Diagnosis Date   Anemia    Asthma    Back pain, chronic, followed at pain clinic 11/25/2011   Carotid artery stenosis 09/06/2014   By Dopplers in September 2017: Right ICA 40-59%.  Less than 40% left ICA.   Chest pain with low risk for cardiac etiology 10/2011   Non-obstructive CAD by Cath; negative Lexiscan in 08/2011   Clotting disorder (HCC)    LLE DVT (also small vessel)   Colon polyps    COPD (chronic obstructive pulmonary disease) (HCC)    Dementia (HCC)    Mild   Depression    Dyslipidemia (high LDL; low HDL)    statin intolerant; on fibrate   Edema leg from Venous Stasis    Venous stasis :wears compression stockings; 08/2012 dopplers - no DVT or thrombophlebitis; mild R Popliteal V reflux - no VNUS ablation candidate   Fainting    Family history of acute myocardial infarction. and premature CAD 11/25/2011   GERD (gastroesophageal reflux disease)    Hearing loss    History of diabetes mellitus    Hypertension    very labile   Nephrolithiasis    Osteoarthritis    Other idiopathic peripheral autonomic neuropathy    Agent orange   PTSD (post-traumatic stress disorder) 07/24/2015   per patient approach from foot of bed to awake; do not apply any contricting pressure, also avoid approaching from behind with any loud noises    Seizure (HCC)    Severe aortic stenosis by prior echocardiogram 10/2018    EF greater than 65%.  Moderate concentric LVH.  01 DD.  Mild LA.  Now AS mean gradient 41 mmHg, peak gradient 65 progression from moderate to now severe)   Small bowel obstruction ([place]) 1990s, 2001, 2015   s/p multiple bowel surgeries; from war wounds   Stroke [facility])     PAST SURGICAL HISTORY   Past Surgical History:  Procedure Laterality Date   APPENDECTOMY  1958   per patient   Carotid Dopplers  01/2016   Stable. RICA - slight progression from<40% to 40-59%.  Otherwise stable bilateral carotids and subclavian arteries. Stable LICA   COLONOSCOPY     DENTAL SURGERY  01/14/2017   5 extractions in preparation for LTKA on 01-25-17; also was started on amoxicillin x 7days; has since completed therapy    exploratory laparotomy with extensive lysis of adhesions  10/1999   JOINT  REPLACEMENT     right knee replaced 2x   LEFT HEART CATHETERIZATION WITH CORONARY ANGIOGRAM N/A 11/26/2011   WNL Lorretta Harp, MD   NM MYOVIEW LTD  08/2011   Negative lexiscan myoview: No ischemia or infarction   RIGHT/LEFT HEART CATH AND CORONARY ANGIOGRAPHY N/A 10/22/2016   Procedure: Right/Left Heart Cath and Coronary Angiography;  Surgeon: Leonie Man, MD;  Location: [facility] INVASIVE CV LAB:: Angiographically normal/minimal CAD with tortuous coronary arteries.  Mild pulmonary pretension secondary to severely elevated LVEDP and systemic hypertension.  Mild-moderate aortic valve stenosis with mean gradient 21 mmHg.   small bowel obstruction  1996, 1999, 2001, 2015   TONSILLECTOMY  1960   per patient   TOTAL KNEE ARTHROPLASTY     TOTAL KNEE ARTHROPLASTY Left 01/25/2017   Procedure: LEFT TOTAL KNEE ARTHROPLASTY;  Surgeon: Gaynelle Arabian, MD;  Location: [facility];  Service: Orthopedics;  Laterality: Left;  Adductor Block   TRANSTHORACIC ECHOCARDIOGRAM  11/03/2018   (progression of AS - severe - Mean Gradient 41 mmHg, Peak 65 mmHg).  EF >65%. Mod Conc LVH - Gr 1 DD. Mild LA dilation.    TRANSTHORACIC  ECHOCARDIOGRAM  01/2017   a) 2018: Moderate LVH.  EF 65-70%.  No RW MA.  Mod AS (mean gradient 25 mmHg -notable progression since 2013).;; b) 01/2018:  Mild LVH.  EF 60 to 65%.  GR 1 DD.  Moderate to severe aortic stenosis.  Mean gradient 36 mmHg.  Peak 65 mmHg. -->  This does show progression from last year.  (Plan to follow-up echo March 2020)   TRANSTHORACIC ECHOCARDIOGRAM  06/07/2019    Severe calcific aortic stenosis-mean gradient up to 49 mmHg the peak of 79 mmHg.Mild-Mod AI.  Normal LV size and function.  EF 65 to 70%.  Mild concentric LVH with GR 1 DD.  Biatrial size-normal.  Moderate mitral valve calcification with mild MAC and mild MR -> compared to June 2020-increase in both gradients from mean 42 mmHg and peak 66 mmHg    MEDICATIONS/ALLERGIES   Current Meds  Medication Sig   albuterol (ACCUNEB) 0.63 MG/3ML nebulizer solution Take 1 ampule by nebulization every 6 (six) hours as needed for wheezing or shortness of breath.    Albuterol Sulfate 108 (90 Base) MCG/ACT AEPB Inhale into the lungs.   ALPRAZolam (XANAX) 1 MG tablet Take 1 tablet (1 mg total) by mouth 3 (three) times daily as needed for anxiety or sleep. (Patient taking differently: Take 1-2 mg by mouth every 8 (eight) hours as needed (for anxiety/PTSD). )   apixaban (ELIQUIS) 5 MG TABS tablet Take 1 tablet (5 mg total) by mouth 2 (two) times daily.   beclomethasone (BECONASE-AQ) 42 MCG/SPRAY nasal spray Place 1 spray into both nostrils daily. Dose is for each nostril.   budesonide-formoterol (SYMBICORT) 160-4.5 MCG/ACT inhaler INHALE 2 PUFFS INTO THE LUNGS TWICE A DAY RINSE MOUTH WELL AFTER USE   carvedilol (COREG) 25 MG tablet Take 1 tablet (25 mg total) by mouth 2 (two) times daily.   fluticasone (FLONASE) 50 MCG/ACT nasal spray Place 1 spray into both nostrils daily.   furosemide (LASIX) 20 MG tablet Take 1 tablet (20 mg total) by mouth daily. MAY TAKE ADDITIONAL TABLET AS NEEDED SWELLING OR SHORTNESS OF BREATH    irbesartan (AVAPRO) 300 MG tablet TAKE 1 TABLET BY MOUTH EVERY DAY   morphine (MSIR) 15 MG tablet TK 1 T PO Q 4 H PRN P   nitroGLYCERIN (NITROSTAT) 0.4 MG SL tablet Place 1 tablet (  0.4 mg total) under the tongue every 5 (five) minutes as needed for chest pain.  . polyethylene glycol (MIRALAX / GLYCOLAX) packet Take 17 g by mouth 2 (two) times daily as needed for moderate constipation.   . Respiratory Therapy Supplies (FLUTTER) DEVI Use as directed  . rosuvastatin (CRESTOR) 10 MG tablet TAKE 1 TABLET BY MOUTH EVERY DAY    Allergies  Allergen Reactions  . Bee Venom Anaphylaxis  . Codeine Shortness Of Breath and Rash  . Doxycycline Shortness Of Breath and Rash  . Ivp Dye [Iodinated Diagnostic Agents] Shortness Of Breath    Lost consciousness/difficulty breathing Omni - Paque Contrast   . Levaquin [Levofloxacin] Hives and Shortness Of Breath  . Oxycodone Hcl Shortness Of Breath, Swelling and Rash  . Stadol [Butorphanol] Shortness Of Breath  . Succinylcholine Chloride Shortness Of Breath, Nausea Only, Rash and Other (See Comments)    Difficulty breathing   . Atorvastatin     myalgias  . Bystolic [Nebivolol Hcl] Other (See Comments)    Nightmares, flashbacks (PTSD)  . Iohexol      Desc: hives,neck and torso erythemia   . Methocarbamol Nausea Only    rash  . Pentazocine Lactate     rash  . Tamiflu [Oseltamivir Phosphate]   . Dilaudid [Hydromorphone Hcl] Nausea Only and Rash    Aggressive   . Synvisc [Hylan G-F 20] Hives and Rash    SOCIAL HISTORY/FAMILY HISTORY   Social History   Tobacco Use  . Smoking status: Never Smoker  . Smokeless tobacco: Former User  Substance Use Topics  . Alcohol use: Yes    Comment: occasional beer   . Drug use: No   Social History   Social History Narrative   He is a father of 3, grandfather 5.    One of his sons was killed during the war in [place]. Grandson killed bike accident 2017.   He was exposed to Agent Orange.   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   Not very physically active due to extreme disability/debilitation from osteoarthritis and peripheral neuropathy.    Family History family history includes Heart attack in his brother, father, mother, sister, and son; Heart disease in his father and mother; Hypertension in his brother and brother; Non-Hodgkin's lymphoma in his daughter; Sleep apnea in his son; Stroke in his mother and son.  OBJCTIVE -PE, EKG, labs   Wt Readings from Last 3 Encounters:  06/29/19 241 lb (109.3 kg)  02/09/19 234 lb 3.2 oz (106.2 kg)  12/15/18 241 lb (109.3 kg)    Physical Exam: BP (!) 177/96   Pulse 87   Ht 6' 1.5" (1.867 m)   Wt 241 lb (109.3 kg)   SpO2 100%   BMI 31.36 kg/m  Physical Exam  Constitutional: He is oriented to person, place, and time. He appears well-developed and well-nourished.  HENT:  Head: Normocephalic and atraumatic.  Eyes: Pupils are equal, round, and reactive to light. EOM are normal.  Neck: Decreased carotid pulses present. No hepatojugular reflux and no JVD present. Carotid bruit is not present (Radiated aortic murmur).  Cardiovascular: Normal rate, regular rhythm, S1 normal and S2 normal.  Occasional extrasystoles are present. PMI is not displaced. Exam reveals distant heart sounds and decreased pulses (Both pedal pulses are decreased, but palpable.). Exam reveals no gallop and no friction rub.  Murmur heard. High-pitched harsh crescendo-decrescendo mid to late systolic murmur is present with a grade   is present with a grade of 4/6 at the upper right sternal border radiating to the neck. Pulmonary/Chest: Effort normal. No respiratory distress. He has wheezes. He has no rales. He exhibits no tenderness.  Diffuse interstitial sounds with rhonchi and crackles, no rales.  Expiratory wheeze  Abdominal: Soft. Bowel sounds are normal. He exhibits no distension. There is no abdominal tenderness. There is no rebound.   Musculoskeletal:        General: Edema (About 1-2+ bilateral lower extremities from the mid shin down mostly in ankles.  Pretty stable) present.     Comments: Normal range of motion, very stiff joints.  Walks with a slow steady antalgic gait  Neurological: He is alert and oriented to person, place, and time. No cranial nerve deficit.  Skin: Skin is warm and dry.  Mild erythema along the left ankle has some maculopapular red rash on both feet across the toes as well as left hand.  Has been told that this is related to agent orange  Psychiatric: He has a normal mood and affect. His behavior is normal. Judgment and thought content normal.  Vitals reviewed.    Adult ECG Report Not checked  Recent Labs:   Lab Results  Component Value Date   CHOL 158 06/15/2018   HDL 47 06/15/2018   LDLCALC 76 06/15/2018   LDLDIRECT 66.4 02/02/2007   TRIG 174 (H) 06/15/2018   CHOLHDL 3.4 06/15/2018   Lab Results  Component Value Date   CREATININE 0.71 02/09/2017   BUN 8 02/09/2017   NA 133 (L) 02/09/2017   K 3.5 02/09/2017   CL 102 02/09/2017   CO2 21 (L) 02/09/2017   Lab Results  Component Value Date   TSH 4.30 10/15/2016    ASSESSMENT/PLAN    Problem List Items Addressed This Visit    Hypertensive heart disease with chronic diastolic congestive heart failure (HCC) - Primary (Chronic)    Labile blood pressure.  He is on max dose carvedilol and irbesartan.  With labile renal function, reluctant to use too much in the way of diuretic.  Have not used amlodipine because of edema.  Going forward, we will probably consider using as needed hydralazine      Relevant Orders   Basic metabolic panel   CBC   Preop cardiovascular exam (Chronic)    He definitely will need TAVR, but as part of that evaluation we will do a right left heart catheterization.      Hyperlipidemia associated with type 2 diabetes mellitus (HCC) (Chronic)   Symptomatic severe aortic stenosis with normal ejection  fraction (Chronic)    Significant progression of disease over the last year with now mean gradient of 49 mmHg.  Clearly there is significant calcified aortic sclerosis.  I am concerned that he is having progressive exertional dyspnea but also had that one episode of possible syncope.  At this point I think is time to move forward with evaluation for TAVR.  We will set up right left heart catheterization as part of initial work-up and refer him to the heart valve clinic.      Postoperative atrial fibrillation [facility]):  CHA2DS2-VASc Score 6 (Chronic)    Continues to note intermittent bursts of irregular heartbeats, we have never documented A. fib since his previous surgery.  They do have the home event monitor to record for possible arrhythmias.  He is on Eliquis and high-dose carvedilol.  If we are able to document any evidence of A. fib, will may consider arrhythmia.  DOE (dyspnea on exertion) (Chronic)    Certainly this is multifactorial with his lung disease disease definitely has abnormal lung exam however cannot exclude the fact that his valve being worse is playing a role as well.  We will proceed with right and left heart catheterization prior to TAVR evaluation.      COPD mixed type (HCC) (Chronic)    Being managed by pulmonary medicine.      Relevant Medications   predniSONE (DELTASONE) 50 MG tablet   Chronic post-traumatic stress disorder (PTSD) 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 (Chronic)    Significant amount of nightmares and flashbacks over the last year.      Syncope and collapse   Relevant Orders   Basic metabolic panel   CBC     Performing MD:  Caeli Linehan, M.D., M.S.  Procedure: RIGHT AND LEFT HEART CATHETERIZATION WITH CORONARY ANGIOGRAPHY  The procedure with Risks/Benefits/Alternatives and Indications was reviewed with the patient and his wife.  All questions were answered.    Risks / Complications include, but not limited to: Death, MI, CVA/TIA, VF/VT (with  defibrillation), Bradycardia (need for temporary pacer placement), contrast induced nephropathy, bleeding / bruising / hematoma / pseudoaneurysm, vascular or coronary injury (with possible emergent CT or Vascular Surgery), adverse medication reactions, infection.  Additional risks involving the use of radiation with the possibility of radiation burns and cancer were explained in detail.  The patient (and family) voice understanding and agree to proceed.     COVID-19 Education: The signs and symptoms of COVID-19 were discussed with the patient and how to seek care for testing (follow up with PCP or arrange E-visit).   The importance of social distancing was discussed today.  I spent a total of 52 minutes with the patient. >  50% of the time was spent in direct patient consultation.  Additional time spent with chart review  / charting (studies, outside notes, etc): 16 Total Time: 68 min   Current medicines are reviewed at length with the patient today.  (+/- concerns) none   Patient Instructions / Medication Changes & Studies & Tests Ordered   Patient Instructions  Medication Instructions:   WILL NEED MEDICATION FOR  CATH - PROPHYLACTIC  DYE ALLEGIES   *If you need a refill on your cardiac medications before your next appointment, please call your pharmacy*  Lab Work:   CBC BMP  COVID  TEST ON  Jul 10, 2019  IN [place]  If you have labs (blood work) drawn today and your tests are completely normal, you will receive your results only by: . MyChart Message (if you have MyChart) OR . A paper copy in the mail If you have any lab test that is abnormal or we need to change your treatment, we will call you to review the results.  Testing/Procedures:  Your physician has requested that you have a  Right and left  cardiac catheterization. Cardiac catheterization is used to diagnose and/or treat various heart conditions. Doctors may recommend this procedure for a number of different reasons.  The most common reason is to evaluate chest pain. Chest pain can be a symptom of coronary artery disease (CAD), and cardiac catheterization can show whether plaque is narrowing or blocking your heart's arteries. This procedure is also used to evaluate the valves, as well as measure the blood flow and oxygen levels in different parts of your heart. For further information please visit www.cardiosmart.org. Please follow instruction sheet, as given.   Follow-Up: At [facility], you and your   health needs are our priority.  As part of our continuing mission to provide you with exceptional heart care, we have created designated Provider Care Teams.  These Care Teams include your primary Cardiologist (physician) and Advanced Practice Providers (APPs -  Physician Assistants and Nurse Practitioners) who all work together to provide you with the care you need, when you need it.  Your next appointment:   3 week(s)  The format for your next appointment:   In Person  Provider:   Jory Sims, DNP, ANP  Other Instructions   Your physician recommends that you schedule a follow-up appointment  TAVR  [place].  Studies Ordered:   Orders Placed This Encounter  Procedures   Basic metabolic panel   CBC     [name], M.D., M.S. Interventional Cardiologist   Pager # [phone] Phone # [phone] [address] [place], [place] [address]   Thank you for choosing Heartcare at [facility]!!

## 2019-06-29 NOTE — Patient Instructions (Addendum)
Medication Instructions:   WILL NEED MEDICATION FOR  CATH - PROPHYLACTIC  DYE ALLEGIES   *If you need a refill on your cardiac medications before your next appointment, please call your pharmacy*  Lab Work:   CBC BMP  COVID  TEST ON  Jul 10, 2019  [place]  If you have labs (blood work) drawn today and your tests are completely normal, you will receive your results only by: [name] MyChart Message (if you have MyChart) OR . A paper copy in the mail If you have any lab test that is abnormal or we need to change your treatment, we will call you to review the results.  Testing/Procedures:  Your physician has requested that you have a  Right and left  cardiac catheterization. Cardiac catheterization is used to diagnose and/or treat various heart conditions. Doctors may recommend this procedure for a number of different reasons. The most common reason is to evaluate chest pain. Chest pain can be a symptom of coronary artery disease (CAD), and cardiac catheterization can show whether plaque is narrowing or blocking your heart's arteries. This procedure is also used to evaluate the valves, as well as measure the blood flow and oxygen levels in different parts of your heart. For further information please visit [facility]. Please follow instruction sheet, as given.   Follow-Up: At [facility], you and your health needs are our priority.  As part of our continuing mission to provide you with exceptional heart care, we have created designated Provider Care Teams.  These Care Teams include your primary Cardiologist (physician) and Advanced Practice Providers (APPs -  Physician Assistants and Nurse Practitioners) who all work together to provide you with the care you need, when you need it.  Your next appointment:   3 week(s)  The format for your next appointment:   In Person  Provider:   Jory Sims, DNP, ANP  Other Instructions   Your physician recommends that you schedule a  follow-up appointment  TAVR  [place].      [address] [place] [address] Dept: [phone] Loc: [phone]  MCCALL BEAUCHESNE  06/29/2019  You are scheduled for a Cardiac Catheterization on Thursday, February 11 with Dr. [name].  1. Please arrive at the [facility] (Main Entrance A) at [facility]: [address] [place], [place] [address] at 8:30 AM (This time is two hours before your procedure to ensure your preparation). Free valet parking service is available.   Special note: Every effort is made to have your procedure done on time. Please understand that emergencies sometimes delay scheduled procedures.  2. Diet: Do not eat solid foods after midnight.  The patient may have clear liquids until 5am upon the day of the procedure.  3. Labs: You will need to have blood drawn  CBC ,BMP   IN [place] AT [facility] , February 8  Do before covid testing.  You do not need to be fasting.  COVID TEST AT  [facility]   4. Medication instructions in preparation for your procedure:   Contrast Allergy: Yes, Please take Prednisone 50mg  by mouth at: Thirteen hours prior to cath 9:00pm on Wednesday  Jul 12, 2019 Seven hours prior to cath 3:00am on Thursday Feb 11,2021 And prior to leaving home please take last dose of Prednisone 50mg  and Benadryl 50mg  by  mouth.  ON FEB 11,2021.   Stop taking Eliquis (Apixiban) on Sunday, February 7. ( THE LAST DAY WILL BE Sunday FEB 7,2021)   START TAKING ENTREIC COATED ASPIRIN 81MG  ON Monday FEB 8,2021  On the morning of your procedure, take your Aspirin 81 MG  and any morning medicines NOT listed above.  You may use sips of water.  5. Plan for one night stay--bring personal belongings. 6. Bring a current list of your medications and current  insurance cards. 7. You MUST have a responsible person to drive you home. 8. Someone MUST be with you the first 24 hours after you arrive home or your discharge will be delayed. 9. Please wear clothes that are easy to get on and off and wear slip-on shoes.  Thank you for allowing [place] to care for you!   -- [facility]

## 2019-06-30 ENCOUNTER — Other Ambulatory Visit: Payer: Self-pay | Admitting: *Deleted

## 2019-06-30 DIAGNOSIS — I350 Nonrheumatic aortic (valve) stenosis: Secondary | ICD-10-CM

## 2019-06-30 DIAGNOSIS — K5903 Drug induced constipation: Secondary | ICD-10-CM | POA: Diagnosis not present

## 2019-06-30 DIAGNOSIS — R4182 Altered mental status, unspecified: Secondary | ICD-10-CM | POA: Diagnosis not present

## 2019-06-30 DIAGNOSIS — R55 Syncope and collapse: Secondary | ICD-10-CM | POA: Diagnosis not present

## 2019-06-30 DIAGNOSIS — R0609 Other forms of dyspnea: Secondary | ICD-10-CM

## 2019-06-30 DIAGNOSIS — R0600 Dyspnea, unspecified: Secondary | ICD-10-CM

## 2019-06-30 DIAGNOSIS — J329 Chronic sinusitis, unspecified: Secondary | ICD-10-CM | POA: Diagnosis not present

## 2019-07-01 ENCOUNTER — Encounter: Payer: Self-pay | Admitting: Cardiology

## 2019-07-01 NOTE — Assessment & Plan Note (Signed)
Certainly this is multifactorial with his lung disease disease definitely has abnormal lung exam however cannot exclude the fact that his valve being worse is playing a role as well.  We will proceed with right and left heart catheterization prior to TAVR evaluation.

## 2019-07-01 NOTE — Assessment & Plan Note (Signed)
Significant progression of disease over the last year with now mean gradient of 49 mmHg.  Clearly there is significant calcified aortic sclerosis.  I am concerned that he is having progressive exertional dyspnea but also had that one episode of possible syncope.  At this point I think is time to move forward with evaluation for TAVR.  We will set up right left heart catheterization as part of initial work-up and refer him to the heart valve clinic.

## 2019-07-01 NOTE — Assessment & Plan Note (Signed)
Labile blood pressure.  He is on max dose carvedilol and irbesartan.  With labile renal function, reluctant to use too much in the way of diuretic.  Have not used amlodipine because of edema.  Going forward, we will probably consider using as needed hydralazine

## 2019-07-01 NOTE — Assessment & Plan Note (Signed)
Continues to note intermittent bursts of irregular heartbeats, we have never documented A. fib since his previous surgery.  They do have the home event monitor to record for possible arrhythmias.  He is on Eliquis and high-dose carvedilol.  If we are able to document any evidence of A. fib, will may consider arrhythmia.

## 2019-07-01 NOTE — Assessment & Plan Note (Signed)
Being managed by pulmonary medicine.

## 2019-07-01 NOTE — Assessment & Plan Note (Signed)
He definitely will need TAVR, but as part of that evaluation we will do a right left heart catheterization.

## 2019-07-01 NOTE — Assessment & Plan Note (Signed)
Significant amount of nightmares and flashbacks over the last year.

## 2019-07-06 DIAGNOSIS — R55 Syncope and collapse: Secondary | ICD-10-CM | POA: Diagnosis not present

## 2019-07-06 DIAGNOSIS — I350 Nonrheumatic aortic (valve) stenosis: Secondary | ICD-10-CM | POA: Diagnosis not present

## 2019-07-06 DIAGNOSIS — I5032 Chronic diastolic (congestive) heart failure: Secondary | ICD-10-CM | POA: Diagnosis not present

## 2019-07-06 DIAGNOSIS — I110 Hypertensive heart disease with heart failure: Secondary | ICD-10-CM | POA: Diagnosis not present

## 2019-07-07 ENCOUNTER — Telehealth: Payer: Self-pay | Admitting: Cardiology

## 2019-07-07 LAB — CBC
Hematocrit: 43.3 % (ref 37.5–51.0)
Hemoglobin: 14 g/dL (ref 13.0–17.7)
MCH: 29.3 pg (ref 26.6–33.0)
MCHC: 32.3 g/dL (ref 31.5–35.7)
MCV: 91 fL (ref 79–97)
Platelets: 283 10*3/uL (ref 150–450)
RBC: 4.78 x10E6/uL (ref 4.14–5.80)
RDW: 13 % (ref 11.6–15.4)
WBC: 6.3 10*3/uL (ref 3.4–10.8)

## 2019-07-07 LAB — BASIC METABOLIC PANEL
BUN/Creatinine Ratio: 14 (ref 10–24)
BUN: 13 mg/dL (ref 8–27)
CO2: 23 mmol/L (ref 20–29)
Calcium: 9.9 mg/dL (ref 8.6–10.2)
Chloride: 100 mmol/L (ref 96–106)
Creatinine, Ser: 0.95 mg/dL (ref 0.76–1.27)
GFR calc Af Amer: 95 mL/min/{1.73_m2} (ref >59)
GFR calc non Af Amer: 82 mL/min/{1.73_m2} (ref >59)
Glucose: 107 mg/dL — ABNORMAL HIGH (ref 65–99)
Potassium: 4.7 mmol/L (ref 3.5–5.2)
Sodium: 140 mmol/L (ref 134–144)

## 2019-07-07 NOTE — Telephone Encounter (Signed)
Pt updated with lab results and verbalized understanding.  

## 2019-07-07 NOTE — Telephone Encounter (Signed)
New Message  Pt called and stated that he was returning a phone call from [place]. Please call back

## 2019-07-10 ENCOUNTER — Other Ambulatory Visit: Payer: Self-pay

## 2019-07-10 ENCOUNTER — Other Ambulatory Visit (HOSPITAL_COMMUNITY)
Admission: RE | Admit: 2019-07-10 | Discharge: 2019-07-10 | Disposition: A | Discharge status: Home / Self Care | Payer: Medicare HMO | Source: Ambulatory Visit | Attending: Cardiology | Admitting: Cardiology

## 2019-07-10 DIAGNOSIS — Z01812 Encounter for preprocedural laboratory examination: Secondary | ICD-10-CM | POA: Insufficient documentation

## 2019-07-10 DIAGNOSIS — Z20822 Contact with and (suspected) exposure to covid-19: Secondary | ICD-10-CM | POA: Insufficient documentation

## 2019-07-10 LAB — SARS CORONAVIRUS 2 (TAT 6-24 HRS): SARS Coronavirus 2: NEGATIVE

## 2019-07-12 ENCOUNTER — Telehealth: Payer: Self-pay | Admitting: *Deleted

## 2019-07-12 ENCOUNTER — Other Ambulatory Visit: Payer: Self-pay | Admitting: Physician Assistant

## 2019-07-12 DIAGNOSIS — I350 Nonrheumatic aortic (valve) stenosis: Secondary | ICD-10-CM

## 2019-07-12 MED ORDER — PREDNISONE 50 MG PO TABS: ORAL_TABLET | ORAL | 3 refills | Status: DC

## 2019-07-12 NOTE — Telephone Encounter (Signed)
Pt contacted pre-catheterization scheduled at [facility] for: Thursday July 13, 2019 10:30 AM Verified arrival time and place: [facility]) at: 8:30 AM   No solid food after midnight prior to cath, clear liquids until 5 AM day of procedure.  Contrast allergy: yes-13 hour Prednisone and Benadryl Prep reviewed with patient:  Prednisone 50 mg 9:30 PM 07/12/19  Prednisone 50 mg 3:30  AM 07/13/19  Prednisone 50 mg and benadryl 50 mg 2/11/21AM just prior to leaving for hospital Pt advised not to drive to hospital.  Hold: Eliquis-none 07/10/19 until post procedure per instructions 06/29/19 Lasix -AM of procedure.  Except hold medications AM meds can be  taken pre-cath with sip of water including: ASA 81 mg Prednisone 50 mg Benadryl 50 mg  Confirmed patient has responsible adult to drive home post procedure and observe 24 hours after arriving home: yes  Currently, due to Covid-19 pandemic, only one person will be allowed with patient. Must be the same person for patient's entire stay and will be required to wear a mask. They will be asked to wait in the waiting room for the duration of the patient's stay.  Patients are required to wear a mask when they enter the hospital.      COVID-19 Pre-Screening Questions:  . In the past 7 to 10 days have you had a cough,  shortness of breath, headache, congestion, fever (100 or greater) body aches, chills, sore throat, or sudden loss of taste or sense of smell? Cough -not new, denies fever   I reviewed procedure/mask/visitor instructions with patient ,he verbalized understanding, thanked me for call.   Pt states he has had first COVID vaccine.

## 2019-07-13 ENCOUNTER — Encounter (HOSPITAL_COMMUNITY)
Admission: RE | Disposition: A | Discharge status: Home / Self Care | Payer: Self-pay | Source: Home / Self Care | Attending: Cardiology

## 2019-07-13 ENCOUNTER — Other Ambulatory Visit: Payer: Self-pay

## 2019-07-13 ENCOUNTER — Ambulatory Visit (HOSPITAL_COMMUNITY)
Admission: RE | Admit: 2019-07-13 | Discharge: 2019-07-13 | Disposition: A | Discharge status: Home / Self Care | Payer: Medicare HMO | Attending: Cardiology | Admitting: Cardiology

## 2019-07-13 DIAGNOSIS — R55 Syncope and collapse: Secondary | ICD-10-CM | POA: Diagnosis not present

## 2019-07-13 DIAGNOSIS — I5032 Chronic diastolic (congestive) heart failure: Secondary | ICD-10-CM | POA: Diagnosis not present

## 2019-07-13 DIAGNOSIS — R0609 Other forms of dyspnea: Secondary | ICD-10-CM | POA: Insufficient documentation

## 2019-07-13 DIAGNOSIS — I110 Hypertensive heart disease with heart failure: Secondary | ICD-10-CM | POA: Diagnosis not present

## 2019-07-13 DIAGNOSIS — Z79899 Other long term (current) drug therapy: Secondary | ICD-10-CM | POA: Insufficient documentation

## 2019-07-13 DIAGNOSIS — I482 Chronic atrial fibrillation, unspecified: Secondary | ICD-10-CM | POA: Insufficient documentation

## 2019-07-13 DIAGNOSIS — Z8673 Personal history of transient ischemic attack (TIA), and cerebral infarction without residual deficits: Secondary | ICD-10-CM | POA: Insufficient documentation

## 2019-07-13 DIAGNOSIS — Z881 Allergy status to other antibiotic agents status: Secondary | ICD-10-CM | POA: Insufficient documentation

## 2019-07-13 DIAGNOSIS — I350 Nonrheumatic aortic (valve) stenosis: Secondary | ICD-10-CM | POA: Diagnosis not present

## 2019-07-13 DIAGNOSIS — E785 Hyperlipidemia, unspecified: Secondary | ICD-10-CM | POA: Insufficient documentation

## 2019-07-13 DIAGNOSIS — K219 Gastro-esophageal reflux disease without esophagitis: Secondary | ICD-10-CM | POA: Diagnosis not present

## 2019-07-13 DIAGNOSIS — R569 Unspecified convulsions: Secondary | ICD-10-CM | POA: Diagnosis not present

## 2019-07-13 DIAGNOSIS — Z885 Allergy status to narcotic agent status: Secondary | ICD-10-CM | POA: Insufficient documentation

## 2019-07-13 DIAGNOSIS — J449 Chronic obstructive pulmonary disease, unspecified: Secondary | ICD-10-CM | POA: Insufficient documentation

## 2019-07-13 DIAGNOSIS — E1140 Type 2 diabetes mellitus with diabetic neuropathy, unspecified: Secondary | ICD-10-CM | POA: Insufficient documentation

## 2019-07-13 DIAGNOSIS — Z7901 Long term (current) use of anticoagulants: Secondary | ICD-10-CM | POA: Insufficient documentation

## 2019-07-13 DIAGNOSIS — I6523 Occlusion and stenosis of bilateral carotid arteries: Secondary | ICD-10-CM | POA: Diagnosis not present

## 2019-07-13 DIAGNOSIS — R69 Illness, unspecified: Secondary | ICD-10-CM | POA: Diagnosis not present

## 2019-07-13 DIAGNOSIS — F329 Major depressive disorder, single episode, unspecified: Secondary | ICD-10-CM | POA: Diagnosis not present

## 2019-07-13 DIAGNOSIS — Z888 Allergy status to other drugs, medicaments and biological substances status: Secondary | ICD-10-CM | POA: Diagnosis not present

## 2019-07-13 DIAGNOSIS — Z91030 Bee allergy status: Secondary | ICD-10-CM | POA: Diagnosis not present

## 2019-07-13 DIAGNOSIS — R0600 Dyspnea, unspecified: Secondary | ICD-10-CM

## 2019-07-13 DIAGNOSIS — Z20822 Contact with and (suspected) exposure to covid-19: Secondary | ICD-10-CM | POA: Insufficient documentation

## 2019-07-13 DIAGNOSIS — Z7951 Long term (current) use of inhaled steroids: Secondary | ICD-10-CM | POA: Insufficient documentation

## 2019-07-13 DIAGNOSIS — Z91041 Radiographic dye allergy status: Secondary | ICD-10-CM | POA: Insufficient documentation

## 2019-07-13 DIAGNOSIS — F4312 Post-traumatic stress disorder, chronic: Secondary | ICD-10-CM | POA: Insufficient documentation

## 2019-07-13 DIAGNOSIS — F0390 Unspecified dementia without behavioral disturbance: Secondary | ICD-10-CM | POA: Diagnosis not present

## 2019-07-13 HISTORY — PX: RIGHT/LEFT HEART CATH AND CORONARY ANGIOGRAPHY: CATH118266

## 2019-07-13 LAB — POCT I-STAT EG7
Acid-Base Excess: 1 mmol/L (ref 0.0–2.0)
Acid-base deficit: 1 mmol/L (ref 0.0–2.0)
Bicarbonate: 24.1 mmol/L (ref 20.0–28.0)
Bicarbonate: 26.2 mmol/L (ref 20.0–28.0)
Calcium, Ion: 1.12 mmol/L — ABNORMAL LOW (ref 1.15–1.40)
Calcium, Ion: 1.26 mmol/L (ref 1.15–1.40)
HCT: 39 % (ref 39.0–52.0)
HCT: 41 % (ref 39.0–52.0)
Hemoglobin: 13.3 g/dL (ref 13.0–17.0)
Hemoglobin: 13.9 g/dL (ref 13.0–17.0)
O2 Saturation: 81 %
O2 Saturation: 82 %
Potassium: 3.5 mmol/L (ref 3.5–5.1)
Potassium: 3.9 mmol/L (ref 3.5–5.1)
Sodium: 138 mmol/L (ref 135–145)
Sodium: 141 mmol/L (ref 135–145)
TCO2: 25 mmol/L (ref 22–32)
TCO2: 27 mmol/L (ref 22–32)
pCO2, Ven: 40.1 mmHg — ABNORMAL LOW (ref 44.0–60.0)
pCO2, Ven: 42.7 mmHg — ABNORMAL LOW (ref 44.0–60.0)
pH, Ven: 7.388 (ref 7.250–7.430)
pH, Ven: 7.396 (ref 7.250–7.430)
pO2, Ven: 46 mmHg — ABNORMAL HIGH (ref 32.0–45.0)
pO2, Ven: 47 mmHg — ABNORMAL HIGH (ref 32.0–45.0)

## 2019-07-13 LAB — POCT I-STAT 7, (LYTES, BLD GAS, ICA,H+H)
Bicarbonate: 25.3 mmol/L (ref 20.0–28.0)
Calcium, Ion: 1.25 mmol/L (ref 1.15–1.40)
HCT: 39 % (ref 39.0–52.0)
Hemoglobin: 13.3 g/dL (ref 13.0–17.0)
O2 Saturation: 98 %
Potassium: 3.8 mmol/L (ref 3.5–5.1)
Sodium: 138 mmol/L (ref 135–145)
TCO2: 27 mmol/L (ref 22–32)
pCO2 arterial: 42.6 mmHg (ref 32.0–48.0)
pH, Arterial: 7.381 (ref 7.350–7.450)
pO2, Arterial: 114 mmHg — ABNORMAL HIGH (ref 83.0–108.0)

## 2019-07-13 LAB — POCT ACTIVATED CLOTTING TIME: Activated Clotting Time: 274 seconds

## 2019-07-13 SURGERY — RIGHT/LEFT HEART CATH AND CORONARY ANGIOGRAPHY
Anesthesia: LOCAL

## 2019-07-13 MED ORDER — SODIUM CHLORIDE 0.9 % WEIGHT BASED INFUSION: 3.0000 mL/kg/h | INTRAVENOUS | Status: DC

## 2019-07-13 MED ORDER — ALBUTEROL SULFATE (2.5 MG/3ML) 0.083% IN NEBU: 3.0000 mL | INHALATION_SOLUTION | Freq: Four times a day (QID) | RESPIRATORY_TRACT | Status: DC | PRN

## 2019-07-13 MED ORDER — LIDOCAINE HCL (PF) 1 % IJ SOLN
INTRAMUSCULAR | Status: AC
  Filled 2019-07-13: qty 30

## 2019-07-13 MED ORDER — IOHEXOL 350 MG/ML SOLN
INTRAVENOUS | Status: DC | PRN
  Administered 2019-07-13: 60 mL

## 2019-07-13 MED ORDER — VERAPAMIL HCL 2.5 MG/ML IV SOLN
INTRAVENOUS | Status: DC | PRN
  Administered 2019-07-13: 10 mL via INTRA_ARTERIAL

## 2019-07-13 MED ORDER — VERAPAMIL HCL 2.5 MG/ML IV SOLN
INTRAVENOUS | Status: AC
  Filled 2019-07-13: qty 2

## 2019-07-13 MED ORDER — SODIUM CHLORIDE 0.9 % WEIGHT BASED INFUSION: 1.0000 mL/kg/h | INTRAVENOUS | Status: DC

## 2019-07-13 MED ORDER — HEPARIN SODIUM (PORCINE) 1000 UNIT/ML IJ SOLN
INTRAMUSCULAR | Status: DC | PRN
  Administered 2019-07-13: 5000 [IU] via INTRAVENOUS

## 2019-07-13 MED ORDER — MIDAZOLAM HCL 2 MG/2ML IJ SOLN
INTRAMUSCULAR | Status: AC
  Filled 2019-07-13: qty 2

## 2019-07-13 MED ORDER — SODIUM CHLORIDE 0.9% FLUSH: 3.0000 mL | Freq: Two times a day (BID) | INTRAVENOUS | Status: DC

## 2019-07-13 MED ORDER — SODIUM CHLORIDE 0.9% FLUSH: 3.0000 mL | INTRAVENOUS | Status: DC | PRN

## 2019-07-13 MED ORDER — MIDAZOLAM HCL 2 MG/2ML IJ SOLN
INTRAMUSCULAR | Status: DC | PRN
  Administered 2019-07-13: 1 mg via INTRAVENOUS
  Administered 2019-07-13: 2 mg via INTRAVENOUS

## 2019-07-13 MED ORDER — LIDOCAINE HCL (PF) 1 % IJ SOLN
INTRAMUSCULAR | Status: DC | PRN
  Administered 2019-07-13: 2 mL
  Administered 2019-07-13: 1 mL

## 2019-07-13 MED ORDER — HEPARIN (PORCINE) IN NACL 1000-0.9 UT/500ML-% IV SOLN
INTRAVENOUS | Status: DC | PRN
  Administered 2019-07-13: 500 mL

## 2019-07-13 MED ORDER — LABETALOL HCL 5 MG/ML IV SOLN: 10.0000 mg | INTRAVENOUS | Status: DC | PRN

## 2019-07-13 MED ORDER — FENTANYL CITRATE (PF) 100 MCG/2ML IJ SOLN
INTRAMUSCULAR | Status: DC | PRN
  Administered 2019-07-13 (×2): 25 ug via INTRAVENOUS

## 2019-07-13 MED ORDER — HEPARIN SODIUM (PORCINE) 1000 UNIT/ML IJ SOLN
INTRAMUSCULAR | Status: AC
  Filled 2019-07-13: qty 1

## 2019-07-13 MED ORDER — ALBUTEROL SULFATE 108 (90 BASE) MCG/ACT IN AEPB: 1.0000 | INHALATION_SPRAY | RESPIRATORY_TRACT | Status: DC | PRN

## 2019-07-13 MED ORDER — HYDRALAZINE HCL 20 MG/ML IJ SOLN: 10.0000 mg | INTRAMUSCULAR | Status: DC | PRN

## 2019-07-13 MED ORDER — FENTANYL CITRATE (PF) 100 MCG/2ML IJ SOLN
INTRAMUSCULAR | Status: AC
  Filled 2019-07-13: qty 2

## 2019-07-13 MED ORDER — ONDANSETRON HCL 4 MG/2ML IJ SOLN: 4.0000 mg | Freq: Four times a day (QID) | INTRAMUSCULAR | Status: DC | PRN

## 2019-07-13 MED ORDER — ACETAMINOPHEN 325 MG PO TABS: 650.0000 mg | ORAL_TABLET | ORAL | Status: DC | PRN

## 2019-07-13 MED ORDER — SODIUM CHLORIDE 0.9 % IV SOLN: 250.0000 mL | INTRAVENOUS | Status: DC | PRN

## 2019-07-13 MED ORDER — HEPARIN (PORCINE) IN NACL 1000-0.9 UT/500ML-% IV SOLN
INTRAVENOUS | Status: AC
  Filled 2019-07-13: qty 1000

## 2019-07-13 MED ORDER — SODIUM CHLORIDE 0.9 % WEIGHT BASED INFUSION
3.0000 mL/kg/h | INTRAVENOUS | Status: AC
  Administered 2019-07-13: 3 mL/kg/h via INTRAVENOUS

## 2019-07-13 MED ORDER — ALPRAZOLAM 0.25 MG PO TABS: 1.0000 mg | ORAL_TABLET | Freq: Three times a day (TID) | ORAL | Status: DC | PRN

## 2019-07-13 SURGICAL SUPPLY — 14 items
CATH BALLN WEDGE 5F 110CM (CATHETERS) ×1 IMPLANT
CATH INFINITI 5FR ANG PIGTAIL (CATHETERS) ×1 IMPLANT
CATH OPTITORQUE TIG 4.0 5F (CATHETERS) ×1 IMPLANT
DEVICE RAD COMP TR BAND LRG (VASCULAR PRODUCTS) ×1 IMPLANT
GLIDESHEATH SLEND SS 6F .021 (SHEATH) ×1 IMPLANT
GUIDEWIRE INQWIRE 1.5J.035X260 (WIRE) IMPLANT
INQWIRE 1.5J .035X260CM (WIRE) ×2
KIT HEART LEFT (KITS) ×2 IMPLANT
PACK CARDIAC CATHETERIZATION (CUSTOM PROCEDURE TRAY) ×2 IMPLANT
SHEATH GLIDE SLENDER 4/5FR (SHEATH) ×1 IMPLANT
SHEATH PROBE COVER 6X72 (BAG) ×1 IMPLANT
TRANSDUCER W/STOPCOCK (MISCELLANEOUS) ×2 IMPLANT
TUBING CIL FLEX 10 FLL-RA (TUBING) ×2 IMPLANT
WIRE EMERALD ST .035X150CM (WIRE) ×1 IMPLANT

## 2019-07-13 NOTE — Progress Notes (Signed)
Discharge instructions reviewed with pt and his wife Pam (via telephone) both voice understanding.

## 2019-07-13 NOTE — Interval H&P Note (Signed)
History and Physical Interval Note:  07/13/2019 1:55 PM  [name]  has presented today for surgery, with the diagnosis of preop-tavr severe aortic stenosis with syncope.  The various methods of treatment have been discussed with the patient and family. After consideration of risks, benefits and other options for treatment, the patient has consented to  Procedure(s): RIGHT/LEFT HEART CATH AND CORONARY ANGIOGRAPHY (N/A) as a surgical intervention.  The patient's history has been reviewed, patient examined, no change in status, stable for surgery.  I have reviewed the patient's chart and labs.  Questions were answered to the patient's satisfaction.     [name]

## 2019-07-13 NOTE — Discharge Instructions (Signed)
Radial Site Care  This sheet gives you information about how to care for yourself after your procedure. Your health care provider may also give you more specific instructions. If you have problems or questions, contact your health care provider. What can I expect after the procedure? After the procedure, it is common to have:  Bruising and tenderness at the catheter insertion area. Follow these instructions at home: Medicines  Take over-the-counter and prescription medicines only as told by your health care provider. Insertion site care  Follow instructions from your health care provider about how to take care of your insertion site. Make sure you: ? Wash your hands with soap and water before you change your bandage (dressing). If soap and water are not available, use hand sanitizer. ? Change your dressing as told by your health care provider. ? Leave stitches (sutures), skin glue, or adhesive strips in place. These skin closures may need to stay in place for 2 weeks or longer. If adhesive strip edges start to loosen and curl up, you may trim the loose edges. Do not remove adhesive strips completely unless your health care provider tells you to do that.  Check your insertion site every day for signs of infection. Check for: ? Redness, swelling, or pain. ? Fluid or blood. ? Pus or a bad smell. ? Warmth.  Do not take baths, swim, or use a hot tub until your health care provider approves.  You may shower 24-48 hours after the procedure, or as directed by your health care provider. ? Remove the dressing and gently wash the site with plain soap and water. ? Pat the area dry with a clean towel. ? Do not rub the site. That could cause bleeding.  Do not apply powder or lotion to the site. Activity   For 24 hours after the procedure, or as directed by your health care provider: ? Do not flex or bend the affected arm. ? Do not push or pull heavy objects with the affected arm. ? Do not  drive yourself home from the hospital or clinic. You may drive 24 hours after the procedure unless your health care provider tells you not to. ? Do not operate machinery or power tools.  Do not lift anything that is heavier than 10 lb (4.5 kg), or the limit that you are told, until your health care provider says that it is safe.  Ask your health care provider when it is okay to: ? Return to work or school. ? Resume usual physical activities or sports. ? Resume sexual activity. General instructions  If the catheter site starts to bleed, raise your arm and put firm pressure on the site. If the bleeding does not stop, get help right away. This is a medical emergency.  If you went home on the same day as your procedure, a responsible adult should be with you for the first 24 hours after you arrive home.  Keep all follow-up visits as told by your health care provider. This is important. Contact a health care provider if:  You have a fever.  You have redness, swelling, or yellow drainage around your insertion site. Get help right away if:  You have unusual pain at the radial site.  The catheter insertion area swells very fast.  The insertion area is bleeding, and the bleeding does not stop when you hold steady pressure on the area.  Your arm or hand becomes pale, cool, tingly, or numb. These symptoms may represent a serious problem   that is an emergency. Do not wait to see if the symptoms will go away. Get medical help right away. Call your local emergency services (911 in the [place] Do not drive yourself to the hospital. Summary  After the procedure, it is common to have bruising and tenderness at the site.  Follow instructions from your health care provider about how to take care of your radial site wound. Check the wound every day for signs of infection.  Do not lift anything that is heavier than 10 lb (4.5 kg), or the limit that you are told, until your health care provider says  that it is safe. This information is not intended to replace advice given to you by your health care provider. Make sure you discuss any questions you have with your health care provider. Document Revised: 06/23/2017 Document Reviewed: 06/23/2017 Elsevier Patient Education  2020 [employer]  

## 2019-07-14 ENCOUNTER — Ambulatory Visit: Payer: Medicare HMO | Admitting: Cardiovascular Disease

## 2019-07-14 VITALS — BP 156/86 | HR 84 | Ht 73.0 in | Wt 242.0 lb

## 2019-07-14 DIAGNOSIS — I350 Nonrheumatic aortic (valve) stenosis: Secondary | ICD-10-CM | POA: Diagnosis not present

## 2019-07-14 MED ORDER — PREDNISONE 50 MG PO TABS: ORAL_TABLET | ORAL | 0 refills | Status: DC

## 2019-07-14 NOTE — Patient Instructions (Signed)
Please see attached letter for future appointment information.

## 2019-07-14 NOTE — Progress Notes (Signed)
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 prior extensive surgery, and advanced arthritis.  The TAVR procedure is reviewed in detail with the patient and his wife today.  A procedural animation is demonstrated.  All their questions were answered.  Further plans pending CAT scan data and formal cardiac surgical consultation.  Medication Adjustments/Labs and Tests Ordered: Current medicines are reviewed at length with the patient today.  Concerns regarding medicines are outlined above.  No orders of the defined types were placed in this encounter.  Meds ordered this encounter  Medications  . predniSONE (DELTASONE) 50 MG tablet    Sig: Take 1 tablet (65m) 13 hours, take 1 tablet (566m 7 hours, and take 1 tablet (5052m1 hour prior to your CT scans.    Dispense:  3 tablet    Refill:  0    Patient Instructions  Please see  attached letter for future appointment information.    Signed, MicSherren MochaD  07/16/2019 3:25 PM    [place]

## 2019-07-16 ENCOUNTER — Encounter: Payer: Self-pay | Admitting: Cardiovascular Disease

## 2019-07-17 DIAGNOSIS — M25562 Pain in left knee: Secondary | ICD-10-CM | POA: Diagnosis not present

## 2019-07-17 DIAGNOSIS — Z79891 Long term (current) use of opiate analgesic: Secondary | ICD-10-CM | POA: Diagnosis not present

## 2019-07-17 DIAGNOSIS — M5136 Other intervertebral disc degeneration, lumbar region: Secondary | ICD-10-CM | POA: Diagnosis not present

## 2019-07-17 DIAGNOSIS — G894 Chronic pain syndrome: Secondary | ICD-10-CM | POA: Diagnosis not present

## 2019-07-19 ENCOUNTER — Ambulatory Visit: Payer: Medicare HMO | Admitting: Adult Health

## 2019-07-21 ENCOUNTER — Telehealth: Payer: Self-pay | Admitting: Cardiology

## 2019-07-21 NOTE — Telephone Encounter (Signed)
New Message   Debbie from [facility] is calling to request the last office from the patients last office visit. The are needing that information to file an appeal for the patient to have a CTA of the heart

## 2019-07-21 NOTE — Telephone Encounter (Signed)
Recent office visit note faxed to [facility] with [facility] for appeal of CTA. Fax number [phone].

## 2019-07-28 ENCOUNTER — Other Ambulatory Visit: Payer: Self-pay

## 2019-07-28 ENCOUNTER — Ambulatory Visit: Payer: Medicare HMO | Attending: Physician Assistant | Admitting: Physical Therapy

## 2019-07-28 ENCOUNTER — Ambulatory Visit (HOSPITAL_COMMUNITY)
Admission: RE | Admit: 2019-07-28 | Discharge: 2019-07-28 | Disposition: A | Discharge status: Home / Self Care | Payer: Medicare HMO | Source: Ambulatory Visit | Attending: Physician Assistant | Admitting: Physician Assistant

## 2019-07-28 ENCOUNTER — Encounter: Payer: Self-pay | Admitting: Physical Therapy

## 2019-07-28 DIAGNOSIS — Z01810 Encounter for preprocedural cardiovascular examination: Secondary | ICD-10-CM | POA: Diagnosis not present

## 2019-07-28 DIAGNOSIS — R262 Difficulty in walking, not elsewhere classified: Secondary | ICD-10-CM

## 2019-07-28 DIAGNOSIS — Z01818 Encounter for other preprocedural examination: Secondary | ICD-10-CM | POA: Diagnosis not present

## 2019-07-28 DIAGNOSIS — K7460 Unspecified cirrhosis of liver: Secondary | ICD-10-CM | POA: Diagnosis not present

## 2019-07-28 DIAGNOSIS — I350 Nonrheumatic aortic (valve) stenosis: Secondary | ICD-10-CM

## 2019-07-28 MED ORDER — IOHEXOL 350 MG/ML SOLN
100.0000 mL | Freq: Once | INTRAVENOUS | Status: AC | PRN
  Administered 2019-07-28: 100 mL via INTRAVENOUS

## 2019-07-28 NOTE — Therapy (Signed)
[place], [place], [address] Phone: [phone]   Fax:  [phone]  Physical Therapy Evaluation  Patient Details  Name: [name] MRN: [id number] Date of Birth: 1950/12/31 Referring Provider (PT): Sherren Mocha, MD   Encounter Date: 07/28/2019  PT End of Session - 07/28/19 0853    PT Start Time  3559       Past Medical History:  Diagnosis Date  . Anemia   . Asthma   . Back pain, chronic, followed at pain clinic 11/25/2011  . Carotid artery stenosis 09/06/2014   By Dopplers in September 2017: Right ICA 40-59%.  Less than 40% left ICA.  [name] Chest pain with low risk for cardiac etiology 10/2011   Non-obstructive CAD by Cath; negative Lexiscan in 08/2011  . Clotting disorder (HCC)    LLE DVT (also small vessel)  . Colon polyps   . COPD (chronic obstructive pulmonary disease) ([place])   . Dementia ([place])    Mild  . Depression   . Dyslipidemia (high LDL; low HDL)    statin intolerant; on fibrate  . Edema leg from Venous Stasis    Venous stasis :wears compression stockings; 08/2012 dopplers - no DVT or thrombophlebitis; mild R Popliteal V reflux - no VNUS ablation candidate  . Fainting   . Family history of acute myocardial infarction. and premature CAD 11/25/2011  . GERD (gastroesophageal reflux disease)   . Hearing loss   . History of diabetes mellitus   . Hypertension    very labile  . Nephrolithiasis   . Osteoarthritis   . Other idiopathic peripheral autonomic neuropathy    Agent orange  . PTSD (post-traumatic stress disorder) 07/24/2015   per patient approach from foot of bed to awake; do not apply any contricting pressure, also avoid approaching from behind with any loud noises   . Seizure ([place])   . Severe aortic stenosis by prior echocardiogram 10/2018   EF greater than 65%.  Moderate concentric LVH.  01 DD.  Mild LA.  Now AS mean gradient 41 mmHg, peak gradient 65 progression from moderate  to now severe)  . Small bowel obstruction ([place]) 1990s, 2001, 2015   s/p multiple bowel surgeries; from war wounds  . Stroke [facility])     Past Surgical History:  Procedure Laterality Date  . APPENDECTOMY  1958   per patient  . Carotid Dopplers  01/2016   Stable. RICA - slight progression from<40% to 40-59%.  Otherwise stable bilateral carotids and subclavian arteries. Stable LICA  . COLONOSCOPY    . DENTAL SURGERY  01/14/2017   5 extractions in preparation for LTKA on 01-25-17; also was started on amoxicillin x 7days; has since completed therapy   . exploratory laparotomy with extensive lysis of adhesions  10/1999  . JOINT REPLACEMENT     right knee replaced 2x  . LEFT HEART CATHETERIZATION WITH CORONARY ANGIOGRAM N/A 11/26/2011   WNL Lorretta Harp, MD  . NM MYOVIEW LTD  08/2011   Negative lexiscan myoview: No ischemia or infarction  . RIGHT/LEFT HEART CATH AND CORONARY ANGIOGRAPHY N/A 10/22/2016   Procedure: Right/Left Heart Cath and Coronary Angiography;  Surgeon: Leonie Man, MD;  Location: [facility] INVASIVE CV LAB:: Angiographically normal/minimal CAD with tortuous coronary arteries.  Mild pulmonary pretension secondary to severely elevated LVEDP and systemic hypertension.  Mild-moderate aortic valve stenosis with mean gradient 21 mmHg.  [name] RIGHT/LEFT HEART CATH AND CORONARY ANGIOGRAPHY N/A 07/13/2019   Procedure: RIGHT/LEFT HEART CATH  AND CORONARY ANGIOGRAPHY;  Surgeon: Leonie Man, MD;  Location: [facility] CV LAB;  Service: Cardiovascular;  Laterality: N/A;  . small bowel obstruction  1996, 1999, 2001, 2015  . TONSILLECTOMY  1960   per patient  . TOTAL KNEE ARTHROPLASTY    . TOTAL KNEE ARTHROPLASTY Left 01/25/2017   Procedure: LEFT TOTAL KNEE ARTHROPLASTY;  Surgeon: Gaynelle Arabian, MD;  Location: [facility] ORS;  Service: Orthopedics;  Laterality: Left;  Adductor Block  . TRANSTHORACIC ECHOCARDIOGRAM  11/03/2018   (progression of AS - severe - Mean Gradient 41 mmHg, Peak 65 mmHg).  EF  >65%. Mod Conc LVH - Gr 1 DD. Mild LA dilation.   . TRANSTHORACIC ECHOCARDIOGRAM  01/2017   a) 2018: Moderate LVH.  EF 65-70%.  No RW MA.  Mod AS (mean gradient 25 mmHg -notable progression since 2013).;; b) 01/2018:  Mild LVH.  EF 60 to 65%.  GR 1 DD.  Moderate to severe aortic stenosis.  Mean gradient 36 mmHg.  Peak 65 mmHg. -->  This does show progression from last year.  (Plan to follow-up echo March 2020)  . TRANSTHORACIC ECHOCARDIOGRAM  06/07/2019    Severe calcific aortic stenosis-mean gradient up to 49 mmHg the peak of 79 mmHg.Mild-Mod AI.  Normal LV size and function.  EF 65 to 70%.  Mild concentric LVH with GR 1 DD.  Biatrial size-normal.  Moderate mitral valve calcification with mild MAC and mild MR -> compared to June 2020-increase in both gradients from mean 42 mmHg and peak 66 mmHg    There were no vitals filed for this visit.   Subjective Assessment - 07/28/19 0854    Subjective  I dont sit down during the day. My fatigue level has really increased over last 8 mo. I have chest pain. Pain around 1-2 but I can't tell if it's from the heart or something else.         [facility] PT Assessment - 07/28/19 0001      Assessment   Medical Diagnosis  Severe aortic stenosis    Referring Provider (PT)  Sherren Mocha, MD    Onset Date/Surgical Date  --   about 8 months ago   Hand Dominance  Right      Precautions   Precautions  Fall    Precaution Comments  neuropathy      Restrictions   Weight Bearing Restrictions  No      Balance Screen   Has the patient fallen in the past 6 months  Yes    How many times?  9    Has the patient had a decrease in activity level because of a fear of falling?   Yes    Is the patient reluctant to leave their home because of a fear of falling?   No      Home Film/video editor residence    Living Arrangements  Spouse/significant other      Prior Function   Level of Independence  Independent      Cognition   Overall  Cognitive Status  Within Functional Limits for tasks assessed      Sensation   Additional Comments  neuropathy in hands and feet      Posture/Postural Control   Posture Comments  forward head, rounded shoulders      ROM / Strength   AROM / PROM / Strength  AROM;Strength      AROM   Overall AROM Comments  grossly 75% ability  Strength   Overall Strength Comments  gross 5/5 in straight plane MMT    Strength Assessment Site  Hand    Right/Left hand  Right;Left    Right Hand Grip (lbs)  100    Left Hand Grip (lbs)  90      Ambulation/Gait   Gait Comments  SPC in Rt hand, low foot clearance, short stride length       [facility] Pre-Surgical Assessment - 07/28/19 0001    5 Meter Walk Test- trial 1  6 sec    5 Meter Walk Test- trial 2  6 sec.     5 Meter Walk Test- trial 3  7 sec.    5 meter walk test average  6.33 sec    4 Stage Balance Test Position  4    Sit To Stand Test- trial 1  27 sec.    6 Minute Walk- Baseline  yes    BP (mmHg)  (!) 173/91   reports he did not take BP meds this AM   HR (bpm)  66    02 Sat (%RA)  99 %    Modified Borg Scale for Dyspnea  0- Nothing at all    Perceived Rate of Exertion (Borg)  6-    6 Minute Walk Post Test  yes    BP (mmHg)  (!) 183/94    HR (bpm)  84    02 Sat (%RA)  99 %    Modified Borg Scale for Dyspnea  4- somewhat severe    Perceived Rate of Exertion (Borg)  13- Somewhat hard    Aerobic Endurance Distance Walked  616    Endurance additional comments  67% limited compared to age related normative values              Objective measurements completed on examination: See above findings.                           Plan - 07/28/19 1007    PT Frequency  One time visit    Consulted and Agree with Plan of Care  Patient       Clinical Impression Statement: Pt is a 69 yo M presenting to OP PT for evaluation prior to possible TAVR surgery due to severe aortic stenosis. Pt reports onset of SOB and fatigue  about 8 months ago. Symptoms are  limiting tolerance to endurance and limits high level activities. Pt presents with mild limitations in ROM and strength and reports musculoskeletal pain in bil shoulders, Left knee and low back but very low prior to testing.  Pt ambulated a total of 616 feet in 6 minute walk and reported 4/10 SOB on modified scale for dyspena and 13/20 RPE on Borg's perceived exertion and pain scale at the end of the walk. During the 6 minute walk test, patient's HR increased to 98 BPM and O2 saturation decreased to 98%. Based on the Short Physical Performance Battery, patient has a frailty rating of 9/12 with </= 5/12 considered frail.  Visit Diagnosis: Difficulty in walking, not elsewhere classified     Problem List Patient Active Problem List   Diagnosis Date Noted  . Chronic post-traumatic stress disorder (PTSD) 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 06/29/2019  . Syncope and collapse 06/29/2019  . Chronic sinusitis 11/02/2017  . Postoperative atrial fibrillation ([place]):  CHA2DS2-VASc Score 6 07/07/2017  . Adynamic ileus ([place]) 02/03/2017  . Allergy to intravenous contrast 10/15/2016  . Preop cardiovascular exam  07/15/2016  . COPD mixed type ([place]) 07/08/2016  . PTSD (post-traumatic stress disorder) 07/24/2015  . DOE (dyspnea on exertion) 09/06/2014  . H/O major abdominal surgery 01/16/2014  . Bilateral lower extremity edema 02/26/2013  . COPD with acute exacerbation ([place]) 01/19/2013  . Symptomatic severe aortic stenosis with normal ejection fraction 03/01/2012  . Back pain, chronic, followed at pain clinic 11/25/2011  . Family history of acute myocardial infarction. and premature CAD 11/25/2011  . Constipation, chronic 09/21/2011  . Incisional hernia 09/21/2011  . Umbilical hernia 46/27/0350  . Lung nodule 01/22/2009  . Diabetes mellitus type 2, controlled, with complications ([place]) 09/38/1829  . OA (osteoarthritis) of knee 01/16/2008  . OTHER SLEEP DISTURBANCES 01/16/2008  .  CLAUDICATION 07/22/2007  . NEPHROLITHIASIS, HX OF 07/22/2007  . Hyperlipidemia associated with type 2 diabetes mellitus ([place]) 01/21/2007  . Other idiopathic peripheral autonomic neuropathy ([place]) 01/21/2007  . Hypertensive heart disease with chronic diastolic congestive heart failure ([place]) 01/21/2007  . Allergic rhinitis 01/21/2007  . GERD 01/21/2007  . DIVERTICULOSIS, COLON 01/21/2007  . OSTEOARTHRITIS 01/21/2007  . History of stroke 01/21/2007  . COLONIC POLYPS, HX OF 01/21/2007  . ESOPHAGEAL STRICTURE 02/02/2006  . ISCHEMIC COLITIS 08/07/2004  . ACUT DUOD ULCER W/O MENTION HEMORR PERF/OBST 06/24/2004    Nyair Depaulo C. Tareka Jhaveri PT, DPT 07/28/19 11:42 AM   [facility] [address] [place], [place], [address] Phone: [phone]   Fax:  [phone]  Name: [name] MRN: [id number] Date of Birth: 26-Feb-1951

## 2019-07-31 ENCOUNTER — Encounter: Payer: Self-pay | Admitting: Physician Assistant

## 2019-07-31 ENCOUNTER — Other Ambulatory Visit: Payer: Self-pay

## 2019-07-31 ENCOUNTER — Institutional Professional Consult (permissible substitution): Payer: Medicare HMO | Admitting: Thoracic Surgery (Cardiothoracic Vascular Surgery)

## 2019-07-31 VITALS — BP 191/101 | HR 74 | Temp 97.7°F | Resp 28 | Ht 74.0 in | Wt 238.0 lb

## 2019-07-31 DIAGNOSIS — I350 Nonrheumatic aortic (valve) stenosis: Secondary | ICD-10-CM | POA: Diagnosis not present

## 2019-07-31 HISTORY — PX: TRANSTHORACIC ECHOCARDIOGRAM: SHX275

## 2019-07-31 NOTE — Patient Instructions (Addendum)
Stop taking Eliquis 5 days prior to surgery (Thursday March 4)  Continue taking all other medications without change through the day before surgery.  Make sure to bring all of your medications with you when you come for your Pre-Admission Testing appointment at [facility] Short-Stay Department.  Have nothing to eat or drink after midnight the night before surgery.  On the morning of surgery do not take any medications although you may use your inhaler if desired.  At your appointment for Pre-Admission Testing at the [facility] Short-Stay Department you will be asked to sign permission forms for your upcoming surgery.  By definition your signature on these forms implies that you and/or your designee provide full informed consent for your planned surgical procedure(s), that alternative treatment options have been discussed, that you understand and accept any and all potential risks, and that you have some understanding of what to expect for your post-operative convalescence.  For any major cardiac surgical procedure potential operative risks include but are not limited to at least some risk of death, stroke or other neurologic complication, myocardial infarction, congestive heart failure, respiratory failure, renal failure, bleeding requiring blood transfusion and/or reexploration, irregular heart rhythm, heart block or bradycardia requiring permanent pacemaker, pneumonia, pericardial effusion, pleural effusion, wound infection, pulmonary embolus or other thromboembolic complication, chronic pain, or other complications related to the specific procedure(s) performed.  For transcatheter aortic valve replacement additional risks include but are not limited to risk of paravalvular leak, valve embolization, valve thrombosis, aortic dissection, aortic rupture, ventricular septal defect or perforation, pericardial tamponade, injury of the abdominal aorta or its branches,  and/or injury or occlusion of the arteries going to your arms or legs.  Please call to schedule a follow-up appointment in our office prior to surgery if you have any unresolved questions about your planned surgical procedure, the associated risks, alternative treatment options, and/or expectations for your post-operative recovery.

## 2019-07-31 NOTE — Progress Notes (Signed)
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 checked for SaO2% to calculate FICK CO/CI  * Catheter removed completely out of the body with balloon deflated.  Left Heart Catheterization: 5Fr Catheters advanced or exchanged over a J-wire under direct fluoroscopic guidance into the ascending aorta; TIG 4.0 catheter advanced first.  * LV Hemodynamics (LV Gram): The aortic valve was crossed using the take 4.0 catheter and straight wire, this was exchanged over a J-wire for angled pigtail catheter * Left & Right Coronary Artery Cineangiography: TIG 4.0 catheter   Upon completion of Angiogaphy, the catheter was removed completely out of the body over a wire, without complication.  Brachial Sheath(s) removed in the Cath Lab with manual pressure for  hemostasis.   Radial sheath removed in the Cardiac Catheterization lab with TR Band placed for hemostasis.  TR Band: 1450 Hours; 12 mL air  MEDICATIONS * SQ Lidocaine 31m * Radial Cocktail: 3 mg Verapmil in 10 mL NS * Heparin: 5000 Units  Estimated blood loss <50 mL.   During this procedure medications were administered to achieve and maintain moderate conscious sedation while the patient's heart rate, blood pressure, and oxygen saturation were continuously monitored and I was present face-to-face 100% of this time.         Contrast Medication Name Total Dose  iohexol (OMNIPAQUE) 350 MG/ML injection 60 mL     Radiation/Fluoro Fluoro time: 9 (min)  DAP: 116384(mGycm2)  Cumulative Air Kerma: 3665(mGy)     Complications Complications documented before study signed (07/13/2019 39:93PM)  No complications were associated with this study.  Documented by HLeonie Man MD - 07/13/2019 3:01 PM     Coronary Findings Diagnostic Dominance: Right  Left Main  Vessel was injected. Vessel is normal in caliber and large. Vessel is angiographically normal.   Left Anterior Descending  Vessel was injected. Vessel is normal in caliber and large. Vessel is angiographically normal.   First Diagonal Branch  The vessel is moderate in size   Second Septal Branch  Vessel is small in size.  Left Circumflex  Vessel was injected. Vessel is normal in caliber and large. Vessel is angiographically normal.   Second Obtuse Marginal Branch  Vessel is moderate in size.   First Left Posterolateral Branch  Vessel is large in size.   Second Left Posterolateral Branch  Vessel is large in size.   Left Posterior Atrioventricular Artery  Vessel is large in size.   Right Coronary Artery  Vessel was injected. Vessel is normal in caliber and large. Vessel is angiographically normal.   Acute Marginal Branch  Vessel is small in size.   Right Ventricular Branch  Vessel is small in size.    Right Posterior Atrioventricular Artery  Vessel is small in size.   First Right Posterolateral Branch  Vessel is small in size.  Intervention No interventions have been documented.          Right Heart Right Heart Pressures Hemodynamic findings consistent with aortic valve stenosis. Estimated mean aortic valve gradient 33-38 mmHg PAP-mean: 29/8 mmHg - 19 mmHg PCWP: 20 mmHg Elevated LV EDP consistent with volume overload. LVEDP-EDP: 217/11 mmHg - 22 mmHg AOP-MAP: 179/89 mmHg - 128 mmHg Ao sat 98%, PA sat 81% Cardiac Output-Index (Fick): 9.58-4.11.   Right Atrium The right atrial size is normal. Right atrial pressure is normal.   Right Ventricle The right ventricular size is normal. The systolic function is normal. RVP-EDP: 41/4 mmHg - 8 mmHg              Wall Motion Resting               No LV gram performed               Left Heart Left Ventricle LV end diastolic pressure is mildly elevated.     Coronary Diagrams Diagnostic Dominance: Right  &&&&&  Intervention      Implants  No implant documentation for this case.      [employer]ng[employer] Images Link to Procedure Log  Show images for CARDIAC CATHETERIZATION Procedure Log     Images on Long Term Storage   Show images for [employer], [place]         Hemo Data   Most Recent Value  Fick Cardiac Output 9.52 L/min  Fick Cardiac Output Index 4.11 (L/min)/BSA  Aortic Mean Gradient 33 mmHg  Aortic Peak Gradient 32 mmHg  Aortic Valve Area 1.92  Aortic Value Area Index 0.83 cm2/BSA  RA A Wave 8 mmHg  RA V Wave 7 mmHg  RA Mean 4 mmHg  RV Systolic Pressure 41 mmHg  RV Diastolic Pressure 4 mmHg  RV EDP 8 mmHg  PA Systolic Pressure 29 mmHg  PA Diastolic Pressure 8 mmHg  PA Mean 19 mmHg  PW A Wave 23 mmHg  PW V Wave 21 mmHg  PW Mean 20 mmHg  AO Systolic Pressure 161 mmHg  AO Diastolic Pressure 86 mmHg  AO Mean 096 mmHg  LV Systolic Pressure 045 mmHg  LV Diastolic Pressure 11 mmHg  LV EDP 21 mmHg  AOp  Systolic Pressure 409 mmHg  AOp Diastolic Pressure 89 mmHg  AOp Mean Pressure 811 mmHg  LVp Systolic Pressure 914 mmHg  LVp Diastolic Pressure 13 mmHg  LVp EDP Pressure 23 mmHg  QP/QS 1  TPVR Index 4.62 HRUI  TSVR Index 28.97 HRUI  TPVR/TSVR Ratio 0.16     Cardiac TAVR CT  TECHNIQUE: The patient was scanned on a Siemens Force 782 slice scanner. A 120 kV retrospective scan was triggered in the ascending  thoracic aorta at 140 HU's. Gantry rotation speed was 250 msecs and collimation was .6 mm. No beta blockade or nitro were given. The 3D data set was reconstructed in 5% intervals of the R-R cycle. Systolic and diastolic phases were analyzed on a dedicated work station using MPR, MIP and VRT modes. The patient received 80 cc of contrast.  FINDINGS: Aortic Valve: Tri leaflet calcified with restricted leaflet motion Severe calcification of the non coronary cusp  Aorta: Mild root dilatation normal arch vessels moderate calcific atherosclerosis  Sino-tubular Junction: 27 mm  Ascending Thoracic Aorta: 38 mm  Aortic Arch: 26 mm  Descending Thoracic Aorta: 26 mm  Sinus of Valsalva Measurements:  Non-coronary: 33.8 mm  Right - coronary: 34.5 mm  Left -   coronary: 38.5 mm  Coronary Artery Height above Annulus:  Left Main: 15.4 mm above annulus  Right Coronary: 16 mm above annulus  Virtual Basal Annulus Measurements:  Maximum / Minimum Diameter: 32 mm x 24.9 mm  Perimeter: 91 mm  Area: 595 mm2  Coronary Arteries: Sufficient height above annulus for deployment  Optimum Fluoroscopic Angle for Delivery: LAO 28 Caudal 1 degree  IMPRESSION: 1. Tri leaflet AV with annular area of 595 mm2 suitable for a 29 mm Sapien 3 ultra valve  2.  Mild aortic root dilatation 3.8 cm  3.  Optimum angiographic angle for deployment LAO 28 Caudal 1 degree  4.  Coronary arteries sufficient height above annulus for deployment  Jenkins Rouge   Electronically Signed   By: Jenkins Rouge M.D.   On: 07/28/2019 12:43    CT ANGIOGRAPHY CHEST, ABDOMEN AND PELVIS  TECHNIQUE: Multidetector CT imaging through the chest, abdomen and pelvis was performed using the standard protocol during bolus administration of intravenous contrast. Multiplanar reconstructed images and MIPs were obtained and reviewed to evaluate the vascular anatomy.  CONTRAST:  170m OMNIPAQUE IOHEXOL 350 MG/ML SOLN  COMPARISON:  CT the abdomen and pelvis 01/15/2014. Chest CT 12/23/2004.  FINDINGS: CTA CHEST FINDINGS  Cardiovascular: Heart size is normal. There is no significant pericardial fluid, thickening or pericardial calcification. There is aortic atherosclerosis, as well as atherosclerosis of the great vessels of the mediastinum and the coronary arteries, including calcified atherosclerotic plaque in the left anterior descending, left circumflex and right coronary arteries. Severe thickening calcification of the aortic valve. Calcifications of the mitral valve and annulus.  Mediastinum/Lymph Nodes: No pathologically enlarged mediastinal or hilar lymph nodes. Esophagus is unremarkable in appearance. Surgical clips adjacent to the distal esophagus. No axillary lymphadenopathy.  Lungs/Pleura: Multiple small calcified and noncalcified pulmonary nodules are noted in the right lung. Largest noncalcified pulmonary nodule measures only 4 mm in the anterior aspect of the right upper lobe (axial image 52 of series 16). No other larger more suspicious appearing pulmonary nodules or masses are noted.  Musculoskeletal/Soft Tissues: There are no aggressive appearing lytic or blastic lesions noted in the visualized portions of the skeleton.  CTA ABDOMEN AND PELVIS FINDINGS  Hepatobiliary: Liver has a shrunken appearance and nodular contour, suggesting underlying cirrhosis. No suspicious cystic or solid hepatic lesions. No intra or  extrahepatic biliary ductal dilatation. Status post cholecystectomy.  Pancreas: No pancreatic mass. No pancreatic ductal dilatation. No pancreatic or peripancreatic fluid collections or inflammatory changes.  Spleen: Unremarkable.  Adrenals/Urinary Tract: Subcentimeter low-attenuation lesions in the right kidney, too small to characterize, but statistically likely to represent cysts. Left kidney and bilateral adrenal glands are normal in appearance. No hydroureteronephrosis. Urinary bladder is normal in appearance.  Stomach/Bowel: Postoperative changes of partial gastrectomy. No pathologic dilatation of small bowel or colon. Numerous colonic diverticulae are noted, without surrounding inflammatory changes to suggest an acute diverticulitis at this time. The appendix is not confidently identified and may be surgically absent. Regardless, there are no inflammatory changes noted adjacent to the cecum to suggest the presence of an acute appendicitis at this time.  Vascular/Lymphatic: Aortic atherosclerosis, without evidence of aneurysm or dissection in the abdominal or pelvic vasculature. Vascular findings and measurements pertinent to potential TAVR procedure, as detailed below. Circumaortic left renal vein (normal anatomical variant) incidentally noted. No lymphadenopathy noted in the abdomen or pelvis.  Reproductive: Prostate gland and seminal vesicles are unremarkable in appearance.  Other: No significant volume of ascites.  No pneumoperitoneum.  Musculoskeletal: There are no aggressive appearing lytic or blastic lesions noted in the visualized portions of the skeleton.  VASCULAR MEASUREMENTS PERTINENT TO TAVR:  AORTA:  Minimal Aortic Diameter-15 x 19 mm  Severity of Aortic Calcification-severe  RIGHT PELVIS:  Right Common Iliac Artery -  Minimal Diameter-11.8 x 10.5 mm  Tortuosity-moderate  Calcification-mild  Right External Iliac Artery  -  Minimal Diameter-9.0 x 8.4 mm  Tortuosity-severe  Calcification-mild  Right Common Femoral Artery -  Minimal Diameter-7.5 x 8.1 mm  Tortuosity-mild  Calcification-severe  LEFT PELVIS:  Left Common Iliac Artery -  Minimal Diameter-12.4 x 10.7 mm  Tortuosity-moderate to severe  Calcification-mild  Left External Iliac Artery -  Minimal Diameter-8.5 x 8.3 mm  Tortuosity-moderate  Calcification-none  Left Common Femoral Artery -  Minimal Diameter-9.7 x 8.8 mm  Tortuosity-mild  Calcification-none  Review of the MIP images confirms the above findings.  IMPRESSION: 1. Vascular findings and measurements pertinent to potential TAVR procedure, as detailed above. 2. Severe thickening calcification of the aortic valve, compatible with reported clinical history of severe aortic stenosis. 3. Small pulmonary nodules are noted in the right lung measuring 4 mm or less in size. These are nonspecific, but statistically likely benign. No follow-up needed if patient is low-risk (and has no known or suspected primary neoplasm). Non-contrast chest CT can be considered in 12 months if patient is high-risk. This recommendation follows the consensus statement: Guidelines for Management of Incidental Pulmonary Nodules Detected on CT Images: From the Fleischner Society 2017; Radiology 2017; 284:228-243. 4. Cirrhosis. 5. Aortic atherosclerosis, in addition to three vessel coronary artery disease. Please note that although the presence of coronary artery calcium documents the presence of coronary artery disease, the severity of this disease and any potential stenosis cannot be assessed on this non-gated CT examination. Assessment for potential risk factor modification, dietary therapy or pharmacologic therapy may be warranted, if clinically indicated. 6. Additional incidental findings, as above.   Electronically Signed   By: Vinnie Langton M.D.    On: 07/28/2019 13:01     Impression:  Patient has stage D severe symptomatic aortic stenosis.  He describes progressive symptoms of exertional shortness of breath and fatigue consistent with chronic diastolic congestive heart failure, [place] Heart Association functional class IIb.  Patient also has chest tightness, dizzy spells, and reports at least 2 recent frank syncopal episodes.  I personally reviewed the patient's recent transthoracic echocardiogram, diagnostic cardiac catheterization, and CT angiograms.  Echocardiogram confirmed the presence of severe aortic stenosis.  Peak velocity across aortic valve measured 4.6 m/s corresponding to mean transvalvular gradient estimated 49 mmHg and aortic valve area calculated 0.94 cm.  There is mild to moderate aortic insufficiency.  Left ventricular systolic function appears normal.  Catheterization confirmed the presence of aortic stenosis and revealed mild nonobstructive coronary artery disease.  I agree the patient needs aortic valve replacement.  He might also benefit from concomitant Maze procedure.  However, risks associated with conventional surgery would be somewhat elevated because of the patient's numerous comorbid medical problems, most notably his significant chronic lung disease on home oxygen therapy as well as somewhat limited physical mobility.  Cardiac-gated CTA of the heart reveals anatomical characteristics consistent with aortic stenosis suitable for treatment by transcatheter aortic valve replacement without any significant complicating features and CTA of the aorta and iliac vessels demonstrate what appears to be adequate pelvic vascular access to facilitate a transfemoral approach.  Twelve-lead EKG reveals sinus rhythm without significant AV conduction delay    Plan:  The patient was counseled at length regarding treatment alternatives for management of severe symptomatic aortic stenosis. Alternative approaches such as conventional  aortic valve replacement, transcatheter aortic valve replacement, and continued medical therapy without intervention were compared and contrasted at length.  The risks associated with conventional surgical aortic valve replacement were discussed in detail, as were expectations for post-operative convalescence.  Potential benefits of concomitant Maze procedure were discussed.  Issues specific to transcatheter aortic valve replacement were discussed including questions about long term valve durability, the potential for paravalvular leak, possible increased risk of need for permanent pacemaker placement, and other technical complications related to the procedure itself.  Long-term prognosis with medical therapy was discussed. This discussion was placed in the context of the patient's own specific clinical presentation and past medical history.  All of their questions have been addressed.  The patient desires to proceed with transcatheter aortic valve replacement as an alternative to conventional surgery.  We plan to proceed with transcatheter aortic valve replacement via transfemoral approach on August 08, 2019.  Following the decision to proceed with transcatheter aortic valve replacement, a discussion has been held regarding what types of management strategies would be attempted intraoperatively in the event of life-threatening complications, including whether or not the patient would be considered a candidate for the use of cardiopulmonary bypass and/or conversion to open sternotomy for attempted surgical intervention.  The patient specifically requests that should a potentially life-threatening complication develop we would attempt emergency median sternotomy and/or other aggressive surgical procedures.  The patient has been advised of a variety of complications that might develop including but not limited to risks of death, stroke, paravalvular leak, aortic dissection or other major vascular complications, aortic  annulus rupture, device embolization, cardiac rupture or perforation, mitral regurgitation, acute myocardial infarction, arrhythmia, heart block or bradycardia requiring permanent pacemaker placement, congestive heart failure, respiratory failure, renal failure, pneumonia, infection, other late complications related to structural valve deterioration or migration, or other complications that might ultimately cause a temporary or permanent loss of functional independence or other long term morbidity.  The patient provides full informed consent for the procedure as described and all questions were answered.    I spent in excess of 90 minutes during the conduct of this office consultation and >50% of this time involved direct face-to-face encounter with the patient for counseling and/or coordination of their care.     Valentina Gu. Roxy Manns, MD 07/31/2019 1:03 PM

## 2019-07-31 NOTE — H&P (View-Only) (Signed)
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 Catheterization: 5Fr Catheters advanced or exchanged over a J-wire under direct fluoroscopic guidance into the ascending aorta; TIG 4.0 catheter advanced first.  * LV Hemodynamics (LV Gram): The aortic valve was crossed using the take 4.0 catheter and straight wire, this was exchanged over a J-wire for angled pigtail catheter * Left & Right Coronary Artery Cineangiography: TIG 4.0 catheter   Upon completion of Angiogaphy, the catheter was removed completely out of the body over a wire, without complication.  Brachial Sheath(s) removed in the Cath Lab with manual pressure for  hemostasis.   Radial sheath removed in the Cardiac Catheterization lab with TR Band placed for hemostasis.  TR Band: 1450 Hours; 12 mL air  MEDICATIONS * SQ Lidocaine 31m * Radial Cocktail: 3 mg Verapmil in 10 mL NS * Heparin: 5000 Units  Estimated blood loss <50 mL.   During this procedure medications were administered to achieve and maintain moderate conscious sedation while the patient's heart rate, blood pressure, and oxygen saturation were continuously monitored and I was present face-to-face 100% of this time.         Contrast Medication Name Total Dose  iohexol (OMNIPAQUE) 350 MG/ML injection 60 mL     Radiation/Fluoro Fluoro time: 9 (min)  DAP: 116384(mGycm2)  Cumulative Air Kerma: 3665(mGy)     Complications Complications documented before study signed (07/13/2019 39:93PM)  No complications were associated with this study.  Documented by HLeonie Man MD - 07/13/2019 3:01 PM     Coronary Findings Diagnostic Dominance: Right  Left Main  Vessel was injected. Vessel is normal in caliber and large. Vessel is angiographically normal.   Left Anterior Descending  Vessel was injected. Vessel is normal in caliber and large. Vessel is angiographically normal.   First Diagonal Branch  The vessel is moderate in size   Second Septal Branch  Vessel is small in size.  Left Circumflex  Vessel was injected. Vessel is normal in caliber and large. Vessel is angiographically normal.   Second Obtuse Marginal Branch  Vessel is moderate in size.   First Left Posterolateral Branch  Vessel is large in size.   Second Left Posterolateral Branch  Vessel is large in size.   Left Posterior Atrioventricular Artery  Vessel is large in size.   Right Coronary Artery  Vessel was injected. Vessel is normal in caliber and large. Vessel is angiographically normal.   Acute Marginal Branch  Vessel is small in size.   Right Ventricular Branch  Vessel is small in size.    Right Posterior Atrioventricular Artery  Vessel is small in size.   First Right Posterolateral Branch  Vessel is small in size.  Intervention No interventions have been documented.          Right Heart Right Heart Pressures Hemodynamic findings consistent with aortic valve stenosis. Estimated mean aortic valve gradient 33-38 mmHg PAP-mean: 29/8 mmHg - 19 mmHg PCWP: 20 mmHg Elevated LV EDP consistent with volume overload. LVEDP-EDP: 217/11 mmHg - 22 mmHg AOP-MAP: 179/89 mmHg - 128 mmHg Ao sat 98%, PA sat 81% Cardiac Output-Index (Fick): 9.58-4.11.   Right Atrium The right atrial size is normal. Right atrial pressure is normal.   Right Ventricle The right ventricular size is normal. The systolic function is normal. RVP-EDP: 41/4 mmHg - 8 mmHg              Wall Motion Resting               No LV gram performed               Left Heart Left Ventricle LV end diastolic pressure is mildly elevated.     Coronary Diagrams Diagnostic Dominance: Right  &&&&&  Intervention      Implants  No implant documentation for this case.      Syngo Images Link to Procedure Log  Show images for CARDIAC CATHETERIZATION Procedure Log     Images on Long Term Storage   Show images for Xai, Frerking         Hemo Data   Most Recent Value  Fick Cardiac Output 9.52 L/min  Fick Cardiac Output Index 4.11 (L/min)/BSA  Aortic Mean Gradient 33 mmHg  Aortic Peak Gradient 32 mmHg  Aortic Valve Area 1.92  Aortic Value Area Index 0.83 cm2/BSA  RA A Wave 8 mmHg  RA V Wave 7 mmHg  RA Mean 4 mmHg  RV Systolic Pressure 41 mmHg  RV Diastolic Pressure 4 mmHg  RV EDP 8 mmHg  PA Systolic Pressure 29 mmHg  PA Diastolic Pressure 8 mmHg  PA Mean 19 mmHg  PW A Wave 23 mmHg  PW V Wave 21 mmHg  PW Mean 20 mmHg  AO Systolic Pressure 161 mmHg  AO Diastolic Pressure 86 mmHg  AO Mean 096 mmHg  LV Systolic Pressure 045 mmHg  LV Diastolic Pressure 11 mmHg  LV EDP 21 mmHg  AOp  Systolic Pressure 409 mmHg  AOp Diastolic Pressure 89 mmHg  AOp Mean Pressure 811 mmHg  LVp Systolic Pressure 914 mmHg  LVp Diastolic Pressure 13 mmHg  LVp EDP Pressure 23 mmHg  QP/QS 1  TPVR Index 4.62 HRUI  TSVR Index 28.97 HRUI  TPVR/TSVR Ratio 0.16     Cardiac TAVR CT  TECHNIQUE: The patient was scanned on a Siemens Force 782 slice scanner. A 120 kV retrospective scan was triggered in the ascending  thoracic aorta at 140 HU's. Gantry rotation speed was 250 msecs and collimation was .6 mm. No beta blockade or nitro were given. The 3D data set was reconstructed in 5% intervals of the R-R cycle. Systolic and diastolic phases were analyzed on a dedicated work station using MPR, MIP and VRT modes. The patient received 80 cc of contrast.  FINDINGS: Aortic Valve: Tri leaflet calcified with restricted leaflet motion Severe calcification of the non coronary cusp  Aorta: Mild root dilatation normal arch vessels moderate calcific atherosclerosis  Sino-tubular Junction: 27 mm  Ascending Thoracic Aorta: 38 mm  Aortic Arch: 26 mm  Descending Thoracic Aorta: 26 mm  Sinus of Valsalva Measurements:  Non-coronary: 33.8 mm  Right - coronary: 34.5 mm  Left -   coronary: 38.5 mm  Coronary Artery Height above Annulus:  Left Main: 15.4 mm above annulus  Right Coronary: 16 mm above annulus  Virtual Basal Annulus Measurements:  Maximum / Minimum Diameter: 32 mm x 24.9 mm  Perimeter: 91 mm  Area: 595 mm2  Coronary Arteries: Sufficient height above annulus for deployment  Optimum Fluoroscopic Angle for Delivery: LAO 28 Caudal 1 degree  IMPRESSION: 1. Tri leaflet AV with annular area of 595 mm2 suitable for a 29 mm Sapien 3 ultra valve  2.  Mild aortic root dilatation 3.8 cm  3.  Optimum angiographic angle for deployment LAO 28 Caudal 1 degree  4.  Coronary arteries sufficient height above annulus for deployment  [name]   Electronically Signed   By: [name] M.D.   On: 07/28/2019 12:43    CT ANGIOGRAPHY CHEST, ABDOMEN AND PELVIS  TECHNIQUE: Multidetector CT imaging through the chest, abdomen and pelvis was performed using the standard protocol during bolus administration of intravenous contrast. Multiplanar reconstructed images and MIPs were obtained and reviewed to evaluate the vascular anatomy.  CONTRAST:  100mL OMNIPAQUE IOHEXOL 350 MG/ML SOLN  COMPARISON:  CT the abdomen and pelvis 01/15/2014. Chest CT 12/23/2004.  FINDINGS: CTA CHEST FINDINGS  Cardiovascular: Heart size is normal. There is no significant pericardial fluid, thickening or pericardial calcification. There is aortic atherosclerosis, as well as atherosclerosis of the great vessels of the mediastinum and the coronary arteries, including calcified atherosclerotic plaque in the left anterior descending, left circumflex and right coronary arteries. Severe thickening calcification of the aortic valve. Calcifications of the mitral valve and annulus.  Mediastinum/Lymph Nodes: No pathologically enlarged mediastinal or hilar lymph nodes. Esophagus is unremarkable in appearance. Surgical clips adjacent to the distal esophagus. No axillary lymphadenopathy.  Lungs/Pleura: Multiple small calcified and noncalcified pulmonary nodules are noted in the right lung. Largest noncalcified pulmonary nodule measures only 4 mm in the anterior aspect of the right upper lobe (axial image 52 of series 16). No other larger more suspicious appearing pulmonary nodules or masses are noted.  Musculoskeletal/Soft Tissues: There are no aggressive appearing lytic or blastic lesions noted in the visualized portions of the skeleton.  CTA ABDOMEN AND PELVIS FINDINGS  Hepatobiliary: Liver has a shrunken appearance and nodular contour, suggesting underlying cirrhosis. No suspicious cystic or solid hepatic lesions. No intra or  extrahepatic biliary ductal dilatation. Status post cholecystectomy.  Pancreas: No pancreatic mass. No pancreatic ductal dilatation. No pancreatic or peripancreatic fluid collections or inflammatory changes.  Spleen: Unremarkable.  Adrenals/Urinary Tract: Subcentimeter low-attenuation lesions in the right kidney, too small to characterize, but statistically likely to represent cysts. Left kidney and bilateral adrenal glands are normal in appearance. No hydroureteronephrosis. Urinary bladder is normal in appearance.    Stomach/Bowel: Postoperative changes of partial gastrectomy. No pathologic dilatation of small bowel or colon. Numerous colonic diverticulae are noted, without surrounding inflammatory changes to suggest an acute diverticulitis at this time. The appendix is not confidently identified and may be surgically absent. Regardless, there are no inflammatory changes noted adjacent to the cecum to suggest the presence of an acute appendicitis at this time.  Vascular/Lymphatic: Aortic atherosclerosis, without evidence of aneurysm or dissection in the abdominal or pelvic vasculature. Vascular findings and measurements pertinent to potential TAVR procedure, as detailed below. Circumaortic left renal vein (normal anatomical variant) incidentally noted. No lymphadenopathy noted in the abdomen or pelvis.  Reproductive: Prostate gland and seminal vesicles are unremarkable in appearance.  Other: No significant volume of ascites.  No pneumoperitoneum.  Musculoskeletal: There are no aggressive appearing lytic or blastic lesions noted in the visualized portions of the skeleton.  VASCULAR MEASUREMENTS PERTINENT TO TAVR:  AORTA:  Minimal Aortic Diameter-15 x 19 mm  Severity of Aortic Calcification-severe  RIGHT PELVIS:  Right Common Iliac Artery -  Minimal Diameter-11.8 x 10.5 mm  Tortuosity-moderate  Calcification-mild  Right External Iliac Artery  -  Minimal Diameter-9.0 x 8.4 mm  Tortuosity-severe  Calcification-mild  Right Common Femoral Artery -  Minimal Diameter-7.5 x 8.1 mm  Tortuosity-mild  Calcification-severe  LEFT PELVIS:  Left Common Iliac Artery -  Minimal Diameter-12.4 x 10.7 mm  Tortuosity-moderate to severe  Calcification-mild  Left External Iliac Artery -  Minimal Diameter-8.5 x 8.3 mm  Tortuosity-moderate  Calcification-none  Left Common Femoral Artery -  Minimal Diameter-9.7 x 8.8 mm  Tortuosity-mild  Calcification-none  Review of the MIP images confirms the above findings.  IMPRESSION: 1. Vascular findings and measurements pertinent to potential TAVR procedure, as detailed above. 2. Severe thickening calcification of the aortic valve, compatible with reported clinical history of severe aortic stenosis. 3. Small pulmonary nodules are noted in the right lung measuring 4 mm or less in size. These are nonspecific, but statistically likely benign. No follow-up needed if patient is low-risk (and has no known or suspected primary neoplasm). Non-contrast chest CT can be considered in 12 months if patient is high-risk. This recommendation follows the consensus statement: Guidelines for Management of Incidental Pulmonary Nodules Detected on CT Images: From the Fleischner Society 2017; Radiology 2017; 284:228-243. 4. Cirrhosis. 5. Aortic atherosclerosis, in addition to three vessel coronary artery disease. Please note that although the presence of coronary artery calcium documents the presence of coronary artery disease, the severity of this disease and any potential stenosis cannot be assessed on this non-gated CT examination. Assessment for potential risk factor modification, dietary therapy or pharmacologic therapy may be warranted, if clinically indicated. 6. Additional incidental findings, as above.   Electronically Signed   By: [name] M.D.    On: 07/28/2019 13:01     Impression:  Patient has stage D severe symptomatic aortic stenosis.  He describes progressive symptoms of exertional shortness of breath and fatigue consistent with chronic diastolic congestive heart failure, New York Heart Association functional class IIb.  Patient also has chest tightness, dizzy spells, and reports at least 2 recent frank syncopal episodes.  I personally reviewed the patient's recent transthoracic echocardiogram, diagnostic cardiac catheterization, and CT angiograms.  Echocardiogram confirmed the presence of severe aortic stenosis.  Peak velocity across aortic valve measured 4.6 m/s corresponding to mean transvalvular gradient estimated 49 mmHg and aortic valve area calculated 0.94 cm.  There is mild to moderate aortic insufficiency.  Left ventricular systolic function appears normal.    Catheterization confirmed the presence of aortic stenosis and revealed mild nonobstructive coronary artery disease.  I agree the patient needs aortic valve replacement.  He might also benefit from concomitant Maze procedure.  However, risks associated with conventional surgery would be somewhat elevated because of the patient's numerous comorbid medical problems, most notably his significant chronic lung disease on home oxygen therapy as well as somewhat limited physical mobility.  Cardiac-gated CTA of the heart reveals anatomical characteristics consistent with aortic stenosis suitable for treatment by transcatheter aortic valve replacement without any significant complicating features and CTA of the aorta and iliac vessels demonstrate what appears to be adequate pelvic vascular access to facilitate a transfemoral approach.  Twelve-lead EKG reveals sinus rhythm without significant AV conduction delay    Plan:  The patient was counseled at length regarding treatment alternatives for management of severe symptomatic aortic stenosis. Alternative approaches such as conventional  aortic valve replacement, transcatheter aortic valve replacement, and continued medical therapy without intervention were compared and contrasted at length.  The risks associated with conventional surgical aortic valve replacement were discussed in detail, as were expectations for post-operative convalescence.  Potential benefits of concomitant Maze procedure were discussed.  Issues specific to transcatheter aortic valve replacement were discussed including questions about long term valve durability, the potential for paravalvular leak, possible increased risk of need for permanent pacemaker placement, and other technical complications related to the procedure itself.  Long-term prognosis with medical therapy was discussed. This discussion was placed in the context of the patient's own specific clinical presentation and past medical history.  All of their questions have been addressed.  The patient desires to proceed with transcatheter aortic valve replacement as an alternative to conventional surgery.  We plan to proceed with transcatheter aortic valve replacement via transfemoral approach on August 08, 2019.  Following the decision to proceed with transcatheter aortic valve replacement, a discussion has been held regarding what types of management strategies would be attempted intraoperatively in the event of life-threatening complications, including whether or not the patient would be considered a candidate for the use of cardiopulmonary bypass and/or conversion to open sternotomy for attempted surgical intervention.  The patient specifically requests that should a potentially life-threatening complication develop we would attempt emergency median sternotomy and/or other aggressive surgical procedures.  The patient has been advised of a variety of complications that might develop including but not limited to risks of death, stroke, paravalvular leak, aortic dissection or other major vascular complications, aortic  annulus rupture, device embolization, cardiac rupture or perforation, mitral regurgitation, acute myocardial infarction, arrhythmia, heart block or bradycardia requiring permanent pacemaker placement, congestive heart failure, respiratory failure, renal failure, pneumonia, infection, other late complications related to structural valve deterioration or migration, or other complications that might ultimately cause a temporary or permanent loss of functional independence or other long term morbidity.  The patient provides full informed consent for the procedure as described and all questions were answered.    I spent in excess of 90 minutes during the conduct of this office consultation and >50% of this time involved direct face-to-face encounter with the patient for counseling and/or coordination of their care.     Valentina Gu. Roxy Manns, MD 07/31/2019 1:03 PM

## 2019-08-01 ENCOUNTER — Other Ambulatory Visit: Payer: Self-pay

## 2019-08-01 DIAGNOSIS — I350 Nonrheumatic aortic (valve) stenosis: Secondary | ICD-10-CM

## 2019-08-01 MED ORDER — PREDNISONE 50 MG PO TABS: ORAL_TABLET | ORAL | 0 refills | Status: DC

## 2019-08-03 NOTE — Progress Notes (Signed)
[id number] [place], [place] - [address] [place] [address] Phone: [phone] Fax: [phone]    Your procedure is scheduled on Tuesday, March 9th.  Report to [facility] Main Entrance "A" at 7:15 A.M., and check in at the Admitting office.  Call this number if you have problems the morning of surgery:  [phone]  Call [phone] if you have any questions prior to your surgery date Monday-Friday 8am-4pm   Remember:  Do not eat or drink after midnight the night before your surgery   Per surgeon:   1. Stop taking Eliquis 5 days prior to surgery (Thursday March 4). Continue taking all other medications without change through the day before surgery.   On the morning of surgery do not take any medications.  You may use your inhaler if desired.   Please follow instructions for Contrast Allergy :   1. Prednisone 50 mg - take 13 hours prior to surgery (8:00 PM on  Monday, 3/8)  2. With a sip of water take another Prednisone 50 mg 7 hours prior to surgery (2:00 AM on Tuesday, 3/9)  As of today, STOP taking any Aspirin (unless otherwise instructed by your surgeon), Aleve, Naproxen, Ibuprofen, Motrin, Advil, Goody's, BC's, all herbal medications, fish oil, and all vitamins.   The Morning of Surgery  Do not wear jewelry..  Do not wear lotions, powders, colognes, or deodorant  Men may shave face and neck.  Do not bring valuables to the hospital.  [facility] is not responsible for any belongings or valuables.  If you are a smoker, DO NOT Smoke 24 hours prior to surgery  If you wear a CPAP at night please bring your mask the morning of surgery   Remember that you must have someone to transport you home after your surgery, and remain with you for 24 hours if you are discharged the same day.  Please bring cases for contacts, glasses, hearing aids, dentures or bridgework because it cannot be worn into surgery.    Leave your suitcase in the car.  After surgery it may be brought to your room.  For patients admitted to the hospital, discharge time will be determined by your treatment team.  Patients discharged the day of surgery will not be allowed to drive home.   Special instructions:   [place]- Preparing For Surgery  Before surgery, you can play an important role. Because skin is not sterile, your skin needs to be as free of germs as possible. You can reduce the number of germs on your skin by washing with CHG (chlorahexidine gluconate) Soap before surgery.  CHG is an antiseptic cleaner which kills germs and bonds with the skin to continue killing germs even after washing.    Oral Hygiene is also important to reduce your risk of infection.  Remember - BRUSH YOUR TEETH THE MORNING OF SURGERY WITH YOUR REGULAR TOOTHPASTE  Please do not use if you have an allergy to CHG or antibacterial soaps. If your skin becomes reddened/irritated stop using the CHG.  Do not shave (including legs and underarms) for at least 48 hours prior to first CHG shower. It is OK to shave your face.  Please follow these instructions carefully.   1. Shower the NIGHT BEFORE SURGERY and the MORNING OF SURGERY with CHG Soap.   2. If you chose to wash your hair, wash your hair first as usual with your normal shampoo.  3. After  you shampoo, rinse your hair and body thoroughly to remove the shampoo.  4. Use CHG as you would any other liquid soap. You can apply CHG directly to the skin and wash gently with a scrungie or a clean washcloth.   5. Apply the CHG Soap to your body ONLY FROM THE NECK DOWN.  Do not use on open wounds or open sores. Avoid contact with your eyes, ears, mouth and genitals (private parts). Wash Face and genitals (private parts)  with your normal soap.   6. Wash thoroughly, paying special attention to the area where your surgery will be performed.  7. Thoroughly rinse your body with warm water from the  neck down.  8. DO NOT shower/wash with your normal soap after using and rinsing off the CHG Soap.  9. Pat yourself dry with a CLEAN TOWEL.  10. Wear CLEAN PAJAMAS to bed the night before surgery, wear comfortable clothes the morning of surgery  11. Place CLEAN SHEETS on your bed the night of your first shower and DO NOT SLEEP WITH PETS.  Day of Surgery: Please shower the morning of surgery with the CHG soap Do not apply any deodorants/lotions. Please wear clean clothes to the hospital/surgery center.   Remember to brush your teeth WITH YOUR REGULAR TOOTHPASTE.  Please read over the following fact sheets that you were given.

## 2019-08-04 ENCOUNTER — Other Ambulatory Visit (HOSPITAL_COMMUNITY): Payer: Medicare HMO

## 2019-08-04 ENCOUNTER — Encounter (HOSPITAL_COMMUNITY)
Admission: RE | Admit: 2019-08-04 | Discharge: 2019-08-04 | Disposition: A | Discharge status: Home / Self Care | Payer: Medicare HMO | Source: Ambulatory Visit | Attending: Cardiovascular Disease | Admitting: Cardiovascular Disease

## 2019-08-04 ENCOUNTER — Ambulatory Visit (HOSPITAL_COMMUNITY)
Admission: RE | Admit: 2019-08-04 | Discharge: 2019-08-04 | Disposition: A | Discharge status: Home / Self Care | Payer: Medicare HMO | Source: Ambulatory Visit | Attending: Cardiovascular Disease | Admitting: Cardiovascular Disease

## 2019-08-04 ENCOUNTER — Encounter (HOSPITAL_COMMUNITY): Payer: Self-pay

## 2019-08-04 ENCOUNTER — Other Ambulatory Visit (HOSPITAL_COMMUNITY)
Admission: RE | Admit: 2019-08-04 | Discharge: 2019-08-04 | Disposition: A | Discharge status: Home / Self Care | Payer: Medicare HMO | Source: Ambulatory Visit | Attending: Cardiovascular Disease | Admitting: Cardiovascular Disease

## 2019-08-04 ENCOUNTER — Other Ambulatory Visit: Payer: Self-pay

## 2019-08-04 DIAGNOSIS — Z20822 Contact with and (suspected) exposure to covid-19: Secondary | ICD-10-CM | POA: Diagnosis not present

## 2019-08-04 DIAGNOSIS — I10 Essential (primary) hypertension: Secondary | ICD-10-CM | POA: Insufficient documentation

## 2019-08-04 DIAGNOSIS — Z01818 Encounter for other preprocedural examination: Secondary | ICD-10-CM | POA: Insufficient documentation

## 2019-08-04 DIAGNOSIS — J449 Chronic obstructive pulmonary disease, unspecified: Secondary | ICD-10-CM | POA: Insufficient documentation

## 2019-08-04 DIAGNOSIS — I350 Nonrheumatic aortic (valve) stenosis: Secondary | ICD-10-CM

## 2019-08-04 HISTORY — DX: Other specified postprocedural states: Z98.890

## 2019-08-04 HISTORY — DX: Nausea with vomiting, unspecified: R11.2

## 2019-08-04 HISTORY — DX: Unspecified fracture of skull, initial encounter for closed fracture: S02.91XA

## 2019-08-04 LAB — CBC
HCT: 42.7 % (ref 39.0–52.0)
Hemoglobin: 13.4 g/dL (ref 13.0–17.0)
MCH: 29.7 pg (ref 26.0–34.0)
MCHC: 31.4 g/dL (ref 30.0–36.0)
MCV: 94.7 fL (ref 80.0–100.0)
Platelets: 245 10*3/uL (ref 150–400)
RBC: 4.51 MIL/uL (ref 4.22–5.81)
RDW: 13.1 % (ref 11.5–15.5)
WBC: 10.8 10*3/uL — ABNORMAL HIGH (ref 4.0–10.5)
nRBC: 0 % (ref 0.0–0.2)

## 2019-08-04 LAB — GLUCOSE, CAPILLARY: Glucose-Capillary: 99 mg/dL (ref 70–99)

## 2019-08-04 LAB — BLOOD GAS, ARTERIAL
Acid-Base Excess: 1.4 mmol/L (ref 0.0–2.0)
Bicarbonate: 25.5 mmol/L (ref 20.0–28.0)
FIO2: 21
O2 Saturation: 97.9 %
Patient temperature: 37
pCO2 arterial: 40.3 mmHg (ref 32.0–48.0)
pH, Arterial: 7.417 (ref 7.350–7.450)
pO2, Arterial: 123 mmHg — ABNORMAL HIGH (ref 83.0–108.0)

## 2019-08-04 LAB — HEMOGLOBIN A1C
Hgb A1c MFr Bld: 5.1 % (ref 4.8–5.6)
Mean Plasma Glucose: 99.67 mg/dL

## 2019-08-04 LAB — COMPREHENSIVE METABOLIC PANEL
ALT: 26 U/L (ref 0–44)
AST: 23 U/L (ref 15–41)
Albumin: 3.9 g/dL (ref 3.5–5.0)
Alkaline Phosphatase: 67 U/L (ref 38–126)
Anion gap: 11 (ref 5–15)
BUN: 22 mg/dL (ref 8–23)
CO2: 22 mmol/L (ref 22–32)
Calcium: 9.6 mg/dL (ref 8.9–10.3)
Chloride: 105 mmol/L (ref 98–111)
Creatinine, Ser: 0.92 mg/dL (ref 0.61–1.24)
GFR calc Af Amer: >60 mL/min (ref >60)
GFR calc non Af Amer: >60 mL/min (ref >60)
Glucose, Bld: 97 mg/dL (ref 70–99)
Potassium: 4.2 mmol/L (ref 3.5–5.1)
Sodium: 138 mmol/L (ref 135–145)
Total Bilirubin: 1.4 mg/dL — ABNORMAL HIGH (ref 0.3–1.2)
Total Protein: 6.9 g/dL (ref 6.5–8.1)

## 2019-08-04 LAB — URINALYSIS, ROUTINE W REFLEX MICROSCOPIC
Bilirubin Urine: NEGATIVE
Glucose, UA: NEGATIVE mg/dL
Hgb urine dipstick: NEGATIVE
Ketones, ur: NEGATIVE mg/dL
Leukocytes,Ua: NEGATIVE
Nitrite: NEGATIVE
Protein, ur: NEGATIVE mg/dL
Specific Gravity, Urine: 1.019 (ref 1.005–1.030)
pH: 6 (ref 5.0–8.0)

## 2019-08-04 LAB — TYPE AND SCREEN
ABO/RH(D): O POS
Antibody Screen: NEGATIVE

## 2019-08-04 LAB — APTT: aPTT: 28 seconds (ref 24–36)

## 2019-08-04 LAB — PROTIME-INR
INR: 1 (ref 0.8–1.2)
Prothrombin Time: 12.9 seconds (ref 11.4–15.2)

## 2019-08-04 LAB — SURGICAL PCR SCREEN
MRSA, PCR: NEGATIVE
Staphylococcus aureus: NEGATIVE

## 2019-08-04 LAB — SARS CORONAVIRUS 2 (TAT 6-24 HRS): SARS Coronavirus 2: NEGATIVE

## 2019-08-04 LAB — ABO/RH: ABO/RH(D): O POS

## 2019-08-04 LAB — BRAIN NATRIURETIC PEPTIDE: B Natriuretic Peptide: 100.3 pg/mL — ABNORMAL HIGH (ref 0.0–100.0)

## 2019-08-04 NOTE — Progress Notes (Signed)
PCP - Dr. Horald Pollen Cardiologist - Dr. [name] Pulmonologist - Dr. Annamaria Boots Pain management - Dr. Nicholaus Bloom  PPM/ICD - denies  Chest x-ray - 08/04/2019 EKG - 08/04/2019 Stress Test - 2011 ECHO - 06/07/2019 Cardiac Cath - 07/13/2019  Sleep Study - per patient had one some years ago CPAP - denies  DM: Per patient "bordeline diabetes, does not take medicine for it, checks blood sugar 3x/day, ranges from  80 - 107"  Blood Thinner Instructions: Eliquis: patient stated last dose was Thursday, 08/03/2019 Aspirin Instructions: N/A  ERAS Protcol - No  COVID TEST- Scheduled for today 08/04/2019 after PAT appointment. Patient verbalized understanding of self-quarantine instructions, appointment time and place.  Anesthesia review: YES, on O2 2L at home. Per patient, only if needed, can be at rest or when doing something.  Patient denies shortness of breath, fever, cough and chest pain at PAT appointment  All instructions explained to the patient, with a verbal understanding of the material. Patient agrees to go over the instructions while at home for a better understanding. Patient also instructed to self quarantine after being tested for COVID-19. The opportunity to ask questions was provided.

## 2019-08-07 ENCOUNTER — Other Ambulatory Visit: Payer: Self-pay

## 2019-08-07 MED ORDER — NOREPINEPHRINE 4 MG/250ML-% IV SOLN
0.0000 ug/min | INTRAVENOUS | Status: DC
  Filled 2019-08-07: qty 250

## 2019-08-07 MED ORDER — POTASSIUM CHLORIDE 2 MEQ/ML IV SOLN
80.0000 meq | INTRAVENOUS | Status: DC
  Filled 2019-08-07: qty 40

## 2019-08-07 MED ORDER — SODIUM CHLORIDE 0.9 % IV SOLN
1.5000 g | INTRAVENOUS | Status: AC
  Administered 2019-08-08: 1.5 g via INTRAVENOUS
  Filled 2019-08-07 (×2): qty 1.5

## 2019-08-07 MED ORDER — VANCOMYCIN HCL 1500 MG/300ML IV SOLN
1500.0000 mg | INTRAVENOUS | Status: AC
  Administered 2019-08-08: 1500 mg via INTRAVENOUS
  Filled 2019-08-07: qty 300

## 2019-08-07 MED ORDER — DEXMEDETOMIDINE HCL IN NACL 400 MCG/100ML IV SOLN
0.1000 ug/kg/h | INTRAVENOUS | Status: AC
  Administered 2019-08-08: 10:00:00 1 ug/kg/h via INTRAVENOUS
  Filled 2019-08-07: qty 100

## 2019-08-07 MED ORDER — SODIUM CHLORIDE 0.9 % IV SOLN
INTRAVENOUS | Status: DC
  Filled 2019-08-07: qty 30

## 2019-08-07 MED ORDER — NITROGLYCERIN 0.4 MG SL SUBL: 0.4000 mg | SUBLINGUAL_TABLET | SUBLINGUAL | 1 refills | Status: DC | PRN

## 2019-08-07 MED ORDER — MAGNESIUM SULFATE 50 % IJ SOLN
40.0000 meq | INTRAMUSCULAR | Status: DC
  Filled 2019-08-07: qty 9.85

## 2019-08-07 NOTE — Anesthesia Preprocedure Evaluation (Addendum)
Anesthesia Evaluation  Patient identified by MRN, date of birth, ID band Patient awake    Reviewed: Allergy & Precautions, NPO status , Patient's Chart, lab work & pertinent test results  History of Anesthesia Complications (+) PONV  Airway Mallampati: I  TM Distance: >3 FB Neck ROM: Full    Dental   Pulmonary    Pulmonary exam normal        Cardiovascular hypertension, Pt. on medications Normal cardiovascular exam     Neuro/Psych Anxiety Depression Dementia    GI/Hepatic GERD  Medicated and Controlled,  Endo/Other  diabetes, Type 2  Renal/GU      Musculoskeletal   Abdominal   Peds  Hematology   Anesthesia Other Findings   Reproductive/Obstetrics                            Anesthesia Physical Anesthesia Plan  ASA: III  Anesthesia Plan: MAC   Post-op Pain Management:    Induction: Intravenous  PONV Risk Score and Plan: 2 and Ondansetron and Midazolam  Airway Management Planned: Simple Face Mask  Additional Equipment:   Intra-op Plan:   Post-operative Plan:   Informed Consent: I have reviewed the patients History and Physical, chart, labs and discussed the procedure including the risks, benefits and alternatives for the proposed anesthesia with the patient or authorized representative who has indicated his/her understanding and acceptance.       Plan Discussed with: CRNA and Surgeon  Anesthesia Plan Comments: (Follows with pulmonology for hx of bronchiectasis/recurrent pneumonia/Enterobacter due to reflux. Office Spirometry 01/16/16-moderate restriction of exhaled volume, at least mild obstructive airways disease. FVC 3.02/59%, FEV1 2.13/56%, ratio 0.95, FEF 25-75% 1.28/42%. He is on 2L O2 PRN. )       Anesthesia Quick Evaluation

## 2019-08-08 ENCOUNTER — Encounter (HOSPITAL_COMMUNITY)
Admission: RE | Disposition: A | Discharge status: Home / Self Care | Payer: Self-pay | Source: Ambulatory Visit | Attending: Thoracic Surgery (Cardiothoracic Vascular Surgery)

## 2019-08-08 ENCOUNTER — Inpatient Hospital Stay (HOSPITAL_COMMUNITY): Payer: Medicare HMO | Admitting: Vascular Surgery

## 2019-08-08 ENCOUNTER — Other Ambulatory Visit: Payer: Self-pay | Admitting: Physician Assistant

## 2019-08-08 ENCOUNTER — Encounter (HOSPITAL_COMMUNITY): Payer: Self-pay | Admitting: Cardiovascular Disease

## 2019-08-08 ENCOUNTER — Inpatient Hospital Stay (HOSPITAL_COMMUNITY): Payer: Medicare HMO | Admitting: Certified Registered Nurse Anesthetist

## 2019-08-08 ENCOUNTER — Other Ambulatory Visit: Payer: Self-pay

## 2019-08-08 ENCOUNTER — Inpatient Hospital Stay (HOSPITAL_COMMUNITY)
Admission: RE | Admit: 2019-08-08 | Discharge: 2019-08-09 | DRG: 267 | Disposition: A | Discharge status: Home / Self Care | Payer: Medicare HMO | Source: Ambulatory Visit | Attending: Thoracic Surgery (Cardiothoracic Vascular Surgery) | Admitting: Thoracic Surgery (Cardiothoracic Vascular Surgery)

## 2019-08-08 ENCOUNTER — Inpatient Hospital Stay (HOSPITAL_COMMUNITY)
Admission: RE | Admit: 2019-08-08 | Discharge: 2019-08-08 | Disposition: A | Discharge status: Home / Self Care | Payer: Medicare HMO | Source: Ambulatory Visit | Attending: Cardiovascular Disease | Admitting: Cardiovascular Disease

## 2019-08-08 DIAGNOSIS — Z903 Acquired absence of stomach [part of]: Secondary | ICD-10-CM

## 2019-08-08 DIAGNOSIS — R69 Illness, unspecified: Secondary | ICD-10-CM | POA: Diagnosis not present

## 2019-08-08 DIAGNOSIS — K219 Gastro-esophageal reflux disease without esophagitis: Secondary | ICD-10-CM | POA: Diagnosis present

## 2019-08-08 DIAGNOSIS — J449 Chronic obstructive pulmonary disease, unspecified: Secondary | ICD-10-CM | POA: Diagnosis present

## 2019-08-08 DIAGNOSIS — Z91041 Radiographic dye allergy status: Secondary | ICD-10-CM | POA: Diagnosis present

## 2019-08-08 DIAGNOSIS — Z006 Encounter for examination for normal comparison and control in clinical research program: Secondary | ICD-10-CM | POA: Diagnosis not present

## 2019-08-08 DIAGNOSIS — G9009 Other idiopathic peripheral autonomic neuropathy: Secondary | ICD-10-CM | POA: Diagnosis present

## 2019-08-08 DIAGNOSIS — I5032 Chronic diastolic (congestive) heart failure: Secondary | ICD-10-CM | POA: Diagnosis not present

## 2019-08-08 DIAGNOSIS — Z79899 Other long term (current) drug therapy: Secondary | ICD-10-CM

## 2019-08-08 DIAGNOSIS — I350 Nonrheumatic aortic (valve) stenosis: Secondary | ICD-10-CM

## 2019-08-08 DIAGNOSIS — I480 Paroxysmal atrial fibrillation: Secondary | ICD-10-CM | POA: Diagnosis present

## 2019-08-08 DIAGNOSIS — Z952 Presence of prosthetic heart valve: Secondary | ICD-10-CM | POA: Diagnosis not present

## 2019-08-08 DIAGNOSIS — Z86718 Personal history of other venous thrombosis and embolism: Secondary | ICD-10-CM | POA: Diagnosis not present

## 2019-08-08 DIAGNOSIS — K222 Esophageal obstruction: Secondary | ICD-10-CM | POA: Diagnosis not present

## 2019-08-08 DIAGNOSIS — Z8673 Personal history of transient ischemic attack (TIA), and cerebral infarction without residual deficits: Secondary | ICD-10-CM

## 2019-08-08 DIAGNOSIS — Z9182 Personal history of military deployment: Secondary | ICD-10-CM | POA: Diagnosis present

## 2019-08-08 DIAGNOSIS — E785 Hyperlipidemia, unspecified: Secondary | ICD-10-CM | POA: Diagnosis present

## 2019-08-08 DIAGNOSIS — F4312 Post-traumatic stress disorder, chronic: Secondary | ICD-10-CM | POA: Diagnosis present

## 2019-08-08 DIAGNOSIS — E1143 Type 2 diabetes mellitus with diabetic autonomic (poly)neuropathy: Secondary | ICD-10-CM | POA: Diagnosis present

## 2019-08-08 DIAGNOSIS — Z9049 Acquired absence of other specified parts of digestive tract: Secondary | ICD-10-CM | POA: Diagnosis not present

## 2019-08-08 DIAGNOSIS — M549 Dorsalgia, unspecified: Secondary | ICD-10-CM | POA: Diagnosis present

## 2019-08-08 DIAGNOSIS — Z20822 Contact with and (suspected) exposure to covid-19: Secondary | ICD-10-CM | POA: Diagnosis present

## 2019-08-08 DIAGNOSIS — Z7901 Long term (current) use of anticoagulants: Secondary | ICD-10-CM | POA: Diagnosis not present

## 2019-08-08 DIAGNOSIS — I110 Hypertensive heart disease with heart failure: Secondary | ICD-10-CM | POA: Diagnosis not present

## 2019-08-08 DIAGNOSIS — F0390 Unspecified dementia without behavioral disturbance: Secondary | ICD-10-CM | POA: Diagnosis present

## 2019-08-08 DIAGNOSIS — Z9981 Dependence on supplemental oxygen: Secondary | ICD-10-CM

## 2019-08-08 DIAGNOSIS — G8929 Other chronic pain: Secondary | ICD-10-CM | POA: Diagnosis present

## 2019-08-08 DIAGNOSIS — I2510 Atherosclerotic heart disease of native coronary artery without angina pectoris: Secondary | ICD-10-CM | POA: Diagnosis present

## 2019-08-08 DIAGNOSIS — Z96652 Presence of left artificial knee joint: Secondary | ICD-10-CM | POA: Diagnosis not present

## 2019-08-08 DIAGNOSIS — R600 Localized edema: Secondary | ICD-10-CM | POA: Diagnosis present

## 2019-08-08 DIAGNOSIS — E118 Type 2 diabetes mellitus with unspecified complications: Secondary | ICD-10-CM | POA: Diagnosis present

## 2019-08-08 HISTORY — PX: TRANSCATHETER AORTIC VALVE REPLACEMENT, TRANSFEMORAL: SHX6400

## 2019-08-08 HISTORY — PX: TEE WITHOUT CARDIOVERSION: SHX5443

## 2019-08-08 HISTORY — DX: Nonrheumatic aortic (valve) stenosis: I35.0

## 2019-08-08 HISTORY — DX: Paroxysmal atrial fibrillation: I48.0

## 2019-08-08 HISTORY — DX: Presence of prosthetic heart valve: Z95.2

## 2019-08-08 LAB — POCT I-STAT, CHEM 8
BUN: 19 mg/dL (ref 8–23)
BUN: 17 mg/dL (ref 8–23)
BUN: 17 mg/dL (ref 8–23)
Calcium, Ion: 1.31 mmol/L (ref 1.15–1.40)
Calcium, Ion: 1.27 mmol/L (ref 1.15–1.40)
Calcium, Ion: 1.31 mmol/L (ref 1.15–1.40)
Chloride: 101 mmol/L (ref 98–111)
Chloride: 102 mmol/L (ref 98–111)
Chloride: 102 mmol/L (ref 98–111)
Creatinine, Ser: 0.6 mg/dL — ABNORMAL LOW (ref 0.61–1.24)
Creatinine, Ser: 0.6 mg/dL — ABNORMAL LOW (ref 0.61–1.24)
Creatinine, Ser: 0.6 mg/dL — ABNORMAL LOW (ref 0.61–1.24)
Glucose, Bld: 160 mg/dL — ABNORMAL HIGH (ref 70–99)
Glucose, Bld: 164 mg/dL — ABNORMAL HIGH (ref 70–99)
Glucose, Bld: 164 mg/dL — ABNORMAL HIGH (ref 70–99)
HCT: 41 % (ref 39.0–52.0)
HCT: 35 % — ABNORMAL LOW (ref 39.0–52.0)
HCT: 36 % — ABNORMAL LOW (ref 39.0–52.0)
Hemoglobin: 13.9 g/dL (ref 13.0–17.0)
Hemoglobin: 11.9 g/dL — ABNORMAL LOW (ref 13.0–17.0)
Hemoglobin: 12.2 g/dL — ABNORMAL LOW (ref 13.0–17.0)
Potassium: 3.8 mmol/L (ref 3.5–5.1)
Potassium: 3.7 mmol/L (ref 3.5–5.1)
Potassium: 3.9 mmol/L (ref 3.5–5.1)
Sodium: 137 mmol/L (ref 135–145)
Sodium: 137 mmol/L (ref 135–145)
Sodium: 137 mmol/L (ref 135–145)
TCO2: 24 mmol/L (ref 22–32)
TCO2: 25 mmol/L (ref 22–32)
TCO2: 25 mmol/L (ref 22–32)

## 2019-08-08 LAB — GLUCOSE, CAPILLARY: Glucose-Capillary: 162 mg/dL — ABNORMAL HIGH (ref 70–99)

## 2019-08-08 LAB — POCT ACTIVATED CLOTTING TIME
Activated Clotting Time: 120 seconds
Activated Clotting Time: 114 seconds
Activated Clotting Time: 329 seconds

## 2019-08-08 SURGERY — IMPLANTATION, AORTIC VALVE, TRANSCATHETER, FEMORAL APPROACH
Anesthesia: Monitor Anesthesia Care

## 2019-08-08 MED ORDER — DIPHENHYDRAMINE HCL 25 MG PO CAPS
50.0000 mg | ORAL_CAPSULE | Freq: Once | ORAL | Status: AC
  Administered 2019-08-08: 50 mg via ORAL
  Filled 2019-08-08: qty 2

## 2019-08-08 MED ORDER — MORPHINE SULFATE (PF) 2 MG/ML IV SOLN: 1.0000 mg | INTRAVENOUS | Status: DC | PRN

## 2019-08-08 MED ORDER — MORPHINE SULFATE 15 MG PO TABS: 15.0000 mg | ORAL_TABLET | ORAL | Status: DC | PRN

## 2019-08-08 MED ORDER — SODIUM CHLORIDE 0.9 % IV SOLN: INTRAVENOUS | Status: AC

## 2019-08-08 MED ORDER — LIDOCAINE HCL (PF) 1 % IJ SOLN
INTRAMUSCULAR | Status: DC | PRN
  Administered 2019-08-08 (×2): 10 mL

## 2019-08-08 MED ORDER — METHYLPREDNISOLONE SODIUM SUCC 125 MG IJ SOLR
INTRAMUSCULAR | Status: DC | PRN
  Administered 2019-08-08: 125 mg via INTRAVENOUS

## 2019-08-08 MED ORDER — SODIUM CHLORIDE 0.9 % IV SOLN: 250.0000 mL | INTRAVENOUS | Status: DC | PRN

## 2019-08-08 MED ORDER — HEPARIN SODIUM (PORCINE) 1000 UNIT/ML IJ SOLN
INTRAMUSCULAR | Status: DC | PRN
  Administered 2019-08-08: 16000 [IU] via INTRAVENOUS

## 2019-08-08 MED ORDER — SODIUM CHLORIDE 0.9 % IV SOLN
1.5000 g | Freq: Two times a day (BID) | INTRAVENOUS | Status: DC
  Administered 2019-08-09: 1.5 g via INTRAVENOUS
  Filled 2019-08-08 (×2): qty 1.5

## 2019-08-08 MED ORDER — ONDANSETRON HCL 4 MG/2ML IJ SOLN
INTRAMUSCULAR | Status: DC | PRN
  Administered 2019-08-08: 4 mg via INTRAVENOUS

## 2019-08-08 MED ORDER — LIDOCAINE HCL (PF) 1 % IJ SOLN
INTRAMUSCULAR | Status: AC
  Filled 2019-08-08: qty 30

## 2019-08-08 MED ORDER — SODIUM CHLORIDE 0.9 % IV SOLN: INTRAVENOUS | Status: DC

## 2019-08-08 MED ORDER — SODIUM CHLORIDE 0.9% FLUSH
3.0000 mL | Freq: Two times a day (BID) | INTRAVENOUS | Status: DC
  Administered 2019-08-08: 3 mL via INTRAVENOUS

## 2019-08-08 MED ORDER — MOMETASONE FURO-FORMOTEROL FUM 200-5 MCG/ACT IN AERO
2.0000 | INHALATION_SPRAY | Freq: Two times a day (BID) | RESPIRATORY_TRACT | Status: DC
  Administered 2019-08-08 – 2019-08-09 (×2): 2 via RESPIRATORY_TRACT
  Filled 2019-08-08: qty 8.8

## 2019-08-08 MED ORDER — LACTATED RINGERS IV SOLN: INTRAVENOUS | Status: DC

## 2019-08-08 MED ORDER — HYDRALAZINE HCL 50 MG PO TABS
50.0000 mg | ORAL_TABLET | Freq: Four times a day (QID) | ORAL | Status: DC
  Administered 2019-08-08 – 2019-08-09 (×4): 50 mg via ORAL
  Filled 2019-08-08 (×4): qty 1

## 2019-08-08 MED ORDER — DEXMEDETOMIDINE HCL 200 MCG/2ML IV SOLN
INTRAVENOUS | Status: DC | PRN
  Administered 2019-08-08: 50 ug via INTRAVENOUS

## 2019-08-08 MED ORDER — CHLORHEXIDINE GLUCONATE 4 % EX LIQD: 60.0000 mL | Freq: Once | CUTANEOUS | Status: DC

## 2019-08-08 MED ORDER — METHYLPREDNISOLONE SODIUM SUCC 125 MG IJ SOLR
INTRAMUSCULAR | Status: AC
  Filled 2019-08-08: qty 2

## 2019-08-08 MED ORDER — NALDEMEDINE TOSYLATE 0.2 MG PO TABS: 0.2000 mg | ORAL_TABLET | Freq: Every day | ORAL | Status: DC | PRN

## 2019-08-08 MED ORDER — ASPIRIN 81 MG PO CHEW
81.0000 mg | CHEWABLE_TABLET | Freq: Every day | ORAL | Status: DC
  Administered 2019-08-09: 81 mg via ORAL
  Filled 2019-08-08: qty 1

## 2019-08-08 MED ORDER — HEPARIN (PORCINE) IN NACL 1000-0.9 UT/500ML-% IV SOLN
INTRAVENOUS | Status: AC
  Filled 2019-08-08: qty 1500

## 2019-08-08 MED ORDER — ONDANSETRON HCL 4 MG/2ML IJ SOLN: 4.0000 mg | Freq: Four times a day (QID) | INTRAMUSCULAR | Status: DC | PRN

## 2019-08-08 MED ORDER — PROPOFOL 500 MG/50ML IV EMUL
INTRAVENOUS | Status: DC | PRN
  Administered 2019-08-08: 50 ug/kg/min via INTRAVENOUS

## 2019-08-08 MED ORDER — SODIUM CHLORIDE 0.9 % IV SOLN
1.5000 g | Freq: Two times a day (BID) | INTRAVENOUS | Status: DC
  Administered 2019-08-08: 1.5 g via INTRAVENOUS
  Filled 2019-08-08 (×2): qty 1.5

## 2019-08-08 MED ORDER — CHLORHEXIDINE GLUCONATE 0.12 % MT SOLN
15.0000 mL | Freq: Once | OROMUCOSAL | Status: AC
  Administered 2019-08-08: 15 mL via OROMUCOSAL
  Filled 2019-08-08: qty 15

## 2019-08-08 MED ORDER — PROTAMINE SULFATE 10 MG/ML IV SOLN
INTRAVENOUS | Status: DC | PRN
  Administered 2019-08-08: 60 mg via INTRAVENOUS
  Administered 2019-08-08: 40 mg via INTRAVENOUS
  Administered 2019-08-08: 60 mg via INTRAVENOUS

## 2019-08-08 MED ORDER — ALPRAZOLAM 0.5 MG PO TABS
1.0000 mg | ORAL_TABLET | Freq: Three times a day (TID) | ORAL | Status: DC | PRN
  Administered 2019-08-08 – 2019-08-09 (×2): 2 mg via ORAL
  Filled 2019-08-08 (×2): qty 4

## 2019-08-08 MED ORDER — DEXAMETHASONE SODIUM PHOSPHATE 4 MG/ML IJ SOLN
INTRAMUSCULAR | Status: DC | PRN
  Administered 2019-08-08: 4 mg via INTRAVENOUS

## 2019-08-08 MED ORDER — PREDNISONE 50 MG PO TABS
50.0000 mg | ORAL_TABLET | Freq: Once | ORAL | Status: AC
  Administered 2019-08-08: 50 mg via ORAL
  Filled 2019-08-08: qty 1

## 2019-08-08 MED ORDER — FLUTICASONE PROPIONATE 50 MCG/ACT NA SUSP
1.0000 | Freq: Every day | NASAL | Status: DC
  Filled 2019-08-08: qty 16

## 2019-08-08 MED ORDER — OXYCODONE HCL 5 MG PO TABS
5.0000 mg | ORAL_TABLET | ORAL | Status: DC | PRN
  Administered 2019-08-08: 10 mg via ORAL
  Filled 2019-08-08: qty 2

## 2019-08-08 MED ORDER — TRAMADOL HCL 50 MG PO TABS: 50.0000 mg | ORAL_TABLET | ORAL | Status: DC | PRN

## 2019-08-08 MED ORDER — DIPHENHYDRAMINE HCL 50 MG/ML IJ SOLN
INTRAMUSCULAR | Status: AC
  Filled 2019-08-08: qty 1

## 2019-08-08 MED ORDER — MORPHINE SULFATE 15 MG PO TABS
15.0000 mg | ORAL_TABLET | ORAL | Status: DC | PRN
  Administered 2019-08-08 – 2019-08-09 (×2): 15 mg via ORAL
  Filled 2019-08-08 (×2): qty 1

## 2019-08-08 MED ORDER — VANCOMYCIN HCL IN DEXTROSE 1-5 GM/200ML-% IV SOLN
1000.0000 mg | Freq: Once | INTRAVENOUS | Status: AC
  Administered 2019-08-08: 1000 mg via INTRAVENOUS
  Filled 2019-08-08: qty 200

## 2019-08-08 MED ORDER — NITROGLYCERIN IN D5W 200-5 MCG/ML-% IV SOLN: 0.0000 ug/min | INTRAVENOUS | Status: DC

## 2019-08-08 MED ORDER — IOHEXOL 350 MG/ML SOLN
INTRAVENOUS | Status: DC | PRN
  Administered 2019-08-08: 11:00:00 40 mL via INTRA_ARTERIAL

## 2019-08-08 MED ORDER — SODIUM CHLORIDE 0.9% FLUSH: 3.0000 mL | INTRAVENOUS | Status: DC | PRN

## 2019-08-08 MED ORDER — ROSUVASTATIN CALCIUM 5 MG PO TABS
10.0000 mg | ORAL_TABLET | Freq: Every day | ORAL | Status: DC
  Administered 2019-08-08 – 2019-08-09 (×2): 10 mg via ORAL
  Filled 2019-08-08 (×2): qty 2

## 2019-08-08 MED ORDER — IOHEXOL 350 MG/ML SOLN
INTRAVENOUS | Status: AC
  Filled 2019-08-08: qty 1

## 2019-08-08 MED ORDER — ACETAMINOPHEN 325 MG PO TABS: 650.0000 mg | ORAL_TABLET | Freq: Four times a day (QID) | ORAL | Status: DC | PRN

## 2019-08-08 MED ORDER — ACETAMINOPHEN 650 MG RE SUPP: 650.0000 mg | Freq: Four times a day (QID) | RECTAL | Status: DC | PRN

## 2019-08-08 MED ORDER — CHLORHEXIDINE GLUCONATE 4 % EX LIQD: 30.0000 mL | CUTANEOUS | Status: DC

## 2019-08-08 MED ORDER — MORPHINE SULFATE (PF) 10 MG/ML IV SOLN: 1.0000 mg | INTRAVENOUS | Status: DC | PRN

## 2019-08-08 MED ORDER — DIPHENHYDRAMINE HCL 50 MG/ML IJ SOLN
INTRAMUSCULAR | Status: DC | PRN
  Administered 2019-08-08: 25 mg via INTRAVENOUS

## 2019-08-08 MED ORDER — PHENYLEPHRINE HCL-NACL 20-0.9 MG/250ML-% IV SOLN: 0.0000 ug/min | INTRAVENOUS | Status: DC

## 2019-08-08 MED ORDER — HEPARIN (PORCINE) IN NACL 1000-0.9 UT/500ML-% IV SOLN
INTRAVENOUS | Status: DC | PRN
  Administered 2019-08-08 (×4): 500 mL

## 2019-08-08 SURGICAL SUPPLY — 36 items
BAG SNAP BAND KOVER 36X36 (MISCELLANEOUS) ×4 IMPLANT
BLANKET WARM UNDERBOD FULL ACC (MISCELLANEOUS) ×2 IMPLANT
CABLE ADAPT PACING TEMP 12FT (ADAPTER) ×2 IMPLANT
CATH 29 EDWARDS DELIVERY SYS (CATHETERS) ×1 IMPLANT
CATH DIAG 6FR PIGTAIL ANGLED (CATHETERS) ×2 IMPLANT
CATH INFINITI 6F AL2 (CATHETERS) ×1 IMPLANT
CATH S G BIP PACING (CATHETERS) ×1 IMPLANT
CLOSURE MYNX CONTROL 6F/7F (Vascular Products) ×1 IMPLANT
CRIMPER (MISCELLANEOUS) ×1 IMPLANT
DEVICE CLOSURE PERCLS PRGLD 6F (VASCULAR PRODUCTS) IMPLANT
DEVICE INFLATION ATRION QL38 (MISCELLANEOUS) ×1 IMPLANT
ELECT DEFIB PAD ADLT CADENCE (PAD) ×1 IMPLANT
GUIDEWIRE SAFE TJ AMPLATZ EXST (WIRE) ×1 IMPLANT
KIT HEART LEFT (KITS) ×2 IMPLANT
KIT MICROPUNCTURE NIT STIFF (SHEATH) ×1 IMPLANT
PACK CARDIAC CATHETERIZATION (CUSTOM PROCEDURE TRAY) ×2 IMPLANT
PERCLOSE PROGLIDE 6F (VASCULAR PRODUCTS) ×4
SHEATH 16X36 EDWARDS (SHEATH) ×1 IMPLANT
SHEATH BRITE TIP 7FR 35CM (SHEATH) ×1 IMPLANT
SHEATH PINNACLE 6F 10CM (SHEATH) ×1 IMPLANT
SHEATH PINNACLE 8F 10CM (SHEATH) ×1 IMPLANT
SHEATH PROBE COVER 6X72 (BAG) ×1 IMPLANT
SHIELD RADPAD SCOOP 12X17 (MISCELLANEOUS) ×1 IMPLANT
SLEEVE REPOSITIONING LENGTH 30 (MISCELLANEOUS) ×1 IMPLANT
STOPCOCK MORSE 400PSI 3WAY (MISCELLANEOUS) ×5 IMPLANT
SYR MEDRAD MARK V 150ML (SYRINGE) ×1 IMPLANT
TRANSDUCER W/STOPCOCK (MISCELLANEOUS) ×4 IMPLANT
TUBE CONN 8.8X1320 FR HP M-F (CONNECTOR) ×1 IMPLANT
TUBING ART PRESS 72  MALE/FEM (TUBING) ×2
TUBING ART PRESS 72 MALE/FEM (TUBING) IMPLANT
TUBING CIL FLEX 10 FLL-RA (TUBING) ×1 IMPLANT
VALVE HEART TRANSCATH SZ3 29MM (Valve) ×1 IMPLANT
WIRE AMPLATZ SS-J .035X180CM (WIRE) ×1 IMPLANT
WIRE EMERALD 3MM-J .035X150CM (WIRE) ×1 IMPLANT
WIRE EMERALD 3MM-J .035X260CM (WIRE) ×1 IMPLANT
WIRE EMERALD ST .035X260CM (WIRE) ×1 IMPLANT
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
## 2019-08-08 NOTE — Progress Notes (Signed)
@  2039 Dr. Launa Grill, on-call for Cardiology paged regarding pt's request for Home PO morphine to be restarted. Order received, will administer when pt requests per order.

## 2019-08-08 NOTE — Anesthesia Procedure Notes (Signed)
Procedure Name: MAC Date/Time: 08/08/2019 10:20 AM Performed by: Teressa Lower., CRNA Pre-anesthesia Checklist: Patient identified, Suction available, Patient being monitored, Emergency Drugs available and Timeout performed Patient Re-evaluated:Patient Re-evaluated prior to induction Oxygen Delivery Method: Simple face mask Ventilation: Oral airway inserted - appropriate to patient size

## 2019-08-08 NOTE — Anesthesia Procedure Notes (Signed)
Arterial Line Insertion Start/End3/01/2020 8:50 AM, 08/08/2019 8:55 AM Performed by: Teressa Lower., CRNA, CRNA  Patient location: Pre-op. Preanesthetic checklist: patient identified, IV checked, site marked, risks and benefits discussed, surgical consent, monitors and equipment checked, pre-op evaluation, timeout performed and anesthesia consent Lidocaine 1% used for infiltration and patient sedated Right, radial was placed Catheter size: 20 G Hand hygiene performed  and Seldinger technique used Allen's test indicative of satisfactory collateral circulation Attempts: 1 Procedure performed without using ultrasound guided technique. Following insertion, Biopatch and dressing applied. Post procedure assessment: normal and unchanged  Patient tolerated the procedure well with no immediate complications.

## 2019-08-08 NOTE — Progress Notes (Signed)
Pt bedrest up. Pt agreeable to walking. 200 feet, tolerated well. Pt denies complaints. Both groins level 0.  Will continue to monitor.   Jerald Kief, RN

## 2019-08-08 NOTE — Progress Notes (Signed)
Pt received from cath lab. Pt C/A/Ox4. Pt denies complaints. Vital stable. CCMD notified/telebox 7 applied. Both groins level 0. Pt belongings @ bedside. Pt oriented to room and call bell within reach. Will continue to monitor. Jerald Kief, RN

## 2019-08-08 NOTE — Progress Notes (Signed)
  Echocardiogram 2D Echocardiogram has been performed.  [name] 08/08/2019, 11:10 AM

## 2019-08-08 NOTE — Anesthesia Postprocedure Evaluation (Signed)
Anesthesia Post Note  Patient: [name]  Procedure(s) Performed: TRANSCATHETER AORTIC VALVE REPLACEMENT, TRANSFEMORAL (N/A ) TRANSESOPHAGEAL ECHOCARDIOGRAM (TEE) (N/A )     Patient location during evaluation: PACU Anesthesia Type: MAC Level of consciousness: awake and alert Pain management: pain level controlled Vital Signs Assessment: post-procedure vital signs reviewed and stable Respiratory status: spontaneous breathing, nonlabored ventilation, respiratory function stable and patient connected to nasal cannula oxygen Cardiovascular status: stable and blood pressure returned to baseline Postop Assessment: no apparent nausea or vomiting Anesthetic complications: no    Last Vitals:  Vitals:   08/08/19 1453 08/08/19 1707  BP: (!) 159/97 (!) 173/85  Pulse: (!) 55 61  Resp: 16 14  Temp: 36.5 C 36.6 C  SpO2: 100% 98%    Last Pain:  Vitals:   08/08/19 1707  TempSrc: Oral  PainSc: 0-No pain                 Bralyn Espino DAVID

## 2019-08-08 NOTE — Transfer of Care (Signed)
Immediate Anesthesia Transfer of Care Note  Patient: [name]  Procedure(s) Performed: TRANSCATHETER AORTIC VALVE REPLACEMENT, TRANSFEMORAL (N/A ) TRANSESOPHAGEAL ECHOCARDIOGRAM (TEE) (N/A )  Patient Location: Cath Lab  Anesthesia Type:MAC  Level of Consciousness: drowsy  Airway & Oxygen Therapy: Patient Spontanous Breathing and Patient connected to face mask oxygen  Post-op Assessment: Report given to RN and Post -op Vital signs reviewed and stable  Post vital signs: Reviewed and stable  Last Vitals:  Vitals Value Taken Time  BP 148/71 08/08/19 1113  Temp 36.5 C 08/08/19 1112  Pulse 63 08/08/19 1114  Resp 12 08/08/19 1114  SpO2 99 % 08/08/19 1114  Vitals shown include unvalidated device data.  Last Pain:  Vitals:   08/08/19 1112  TempSrc: Oral  PainSc:       Patients Stated Pain Goal: 4 (09/47/09 6283)  Complications: No apparent anesthesia complications

## 2019-08-08 NOTE — Progress Notes (Addendum)
  [facility] VALVE TEAM  Patient doing well s/p TAVR. He is hemodynamically stable. Groin sites stable. ECG with sinus and no high grade block. Arterial line to be discontinued and transfer to 4E. Plan for early ambulation after bedrest completed and hopeful discharge over the next 24-48 hours.   Angelena Form PA-C  [facility]  Pager [phone]

## 2019-08-08 NOTE — Interval H&P Note (Signed)
History and Physical Interval Note:  08/08/2019 9:52 AM  [name]  has presented today for surgery, with the diagnosis of Severe Aortic Stenosis.  The various methods of treatment have been discussed with the patient and family. After consideration of risks, benefits and other options for treatment, the patient has consented to  Procedure(s): TRANSCATHETER AORTIC VALVE REPLACEMENT, TRANSFEMORAL (N/A) TRANSESOPHAGEAL ECHOCARDIOGRAM (TEE) (N/A) as a surgical intervention.  The patient's history has been reviewed, patient examined, no change in status, stable for surgery.  I have reviewed the patient's chart and labs.  Questions were answered to the patient's satisfaction.     Sherren Mocha

## 2019-08-08 NOTE — Discharge Instructions (Signed)
ACTIVITY AND EXERCISE °• Daily activity and exercise are an important part of your recovery. People recover at different rates depending on their general health and type of valve procedure. °• Most people recovering from TAVR feel better relatively quickly  °• No lifting, pushing, pulling more than 10 pounds (examples to avoid: groceries, vacuuming, gardening, golfing): °            - For one week with a procedure through the groin. °            - For six weeks for procedures through the chest wall or neck. °NOTE: You will typically see one of our providers 7-14 days after your procedure to discuss WHEN TO RESUME the above activities.  °  °  °DRIVING °• Do not drive until you are seen for follow up and cleared by a provider. Generally, we ask patient to not drive for 1 week after their procedure. °• If you have been told by your doctor in the past that you may not drive, you must talk with him/her before you begin driving again. °  °DRESSING °• Groin site: you may leave the clear dressing over the site for up to one week or until it falls off. °  °HYGIENE °• If you had a femoral (leg) procedure, you may take a shower when you return home. After the shower, pat the site dry. Do NOT use powder, oils or lotions in your groin area until the site has completely healed. °• If you had a chest procedure, you may shower when you return home unless specifically instructed not to by your discharging practitioner. °            - DO NOT scrub incision; pat dry with a towel. °            - DO NOT apply any lotions, oils, powders to the incision. °            - No tub baths / swimming for at least 2 weeks. °• If you notice any fevers, chills, increased pain, swelling, bleeding or pus, please contact your doctor. °  °ADDITIONAL INFORMATION °• If you are going to have an upcoming dental procedure, please contact our office as you will require antibiotics ahead of time to prevent infection on your heart valve.  ° ° °If you have any  questions or concerns you can call the structural heart phone during normal business hours 8am-4pm. If you have an urgent need after hours or weekends please call [phone] to talk to the on call provider for general cardiology. If you have an emergency that requires immediate attention, please call 911.  ° ° °After TAVR Checklist ° °Check  Test Description  ° Follow up appointment in 1-2 weeks  You will see our structural heart physician assistant, Katie Abou Sterkel. Your incision sites will be checked and you will be cleared to drive and resume all normal activities if you are doing well.    ° 1 month echo and follow up  You will have an echo to check on your new heart valve and be seen back in the office by Katie Veer Elamin. Many times the echo is not read by your appointment time, but Katie will call you later that day or the following day to report your results.  ° Follow up with your primary cardiologist You will need to be seen by your primary cardiologist in the following 3-6 months after your 1 month appointment in the valve   clinic. Often times your Plavix or Aspirin will be discontinued during this time, but this is decided on a case by case basis.   ° 1 year echo and follow up You will have another echo to check on your heart valve after 1 year and be seen back in the office by Katie Zoraya Fiorenza. This your last structural heart visit.  ° Bacterial endocarditis prophylaxis  You will have to take antibiotics for the rest of your life before all dental procedures (even teeth cleanings) to protect your heart valve. Antibiotics are also required before some surgeries. Please check with your cardiologist before scheduling any surgeries. Also, please make sure to tell us if you have a penicillin allergy as you will require an alternative antibiotic.   ° ° °
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
## 2019-08-09 ENCOUNTER — Inpatient Hospital Stay (HOSPITAL_COMMUNITY): Payer: Medicare HMO

## 2019-08-09 DIAGNOSIS — Z952 Presence of prosthetic heart valve: Secondary | ICD-10-CM

## 2019-08-09 LAB — GLUCOSE, CAPILLARY: Glucose-Capillary: 138 mg/dL — ABNORMAL HIGH (ref 70–99)

## 2019-08-09 LAB — CBC
HCT: 38.4 % — ABNORMAL LOW (ref 39.0–52.0)
Hemoglobin: 13 g/dL (ref 13.0–17.0)
MCH: 30.2 pg (ref 26.0–34.0)
MCHC: 33.9 g/dL (ref 30.0–36.0)
MCV: 89.1 fL (ref 80.0–100.0)
Platelets: 241 10*3/uL (ref 150–400)
RBC: 4.31 MIL/uL (ref 4.22–5.81)
RDW: 12.9 % (ref 11.5–15.5)
WBC: 15.5 10*3/uL — ABNORMAL HIGH (ref 4.0–10.5)
nRBC: 0 % (ref 0.0–0.2)

## 2019-08-09 LAB — BASIC METABOLIC PANEL
Anion gap: 11 (ref 5–15)
BUN: 15 mg/dL (ref 8–23)
CO2: 23 mmol/L (ref 22–32)
Calcium: 9.6 mg/dL (ref 8.9–10.3)
Chloride: 103 mmol/L (ref 98–111)
Creatinine, Ser: 0.89 mg/dL (ref 0.61–1.24)
GFR calc Af Amer: >60 mL/min (ref >60)
GFR calc non Af Amer: >60 mL/min (ref >60)
Glucose, Bld: 166 mg/dL — ABNORMAL HIGH (ref 70–99)
Potassium: 3.8 mmol/L (ref 3.5–5.1)
Sodium: 137 mmol/L (ref 135–145)

## 2019-08-09 LAB — ECHOCARDIOGRAM LIMITED
Height: 74 in
Weight: 3833.6 oz

## 2019-08-09 LAB — MAGNESIUM: Magnesium: 2.1 mg/dL (ref 1.7–2.4)

## 2019-08-09 MED ORDER — IRBESARTAN 300 MG PO TABS
300.0000 mg | ORAL_TABLET | Freq: Every day | ORAL | Status: DC
  Administered 2019-08-09: 300 mg via ORAL
  Filled 2019-08-09: qty 1

## 2019-08-09 MED ORDER — CARVEDILOL 25 MG PO TABS
25.0000 mg | ORAL_TABLET | Freq: Two times a day (BID) | ORAL | Status: DC
  Administered 2019-08-09: 25 mg via ORAL
  Filled 2019-08-09: qty 1

## 2019-08-09 MED ORDER — ASPIRIN 81 MG PO CHEW: 81.0000 mg | CHEWABLE_TABLET | Freq: Every day | ORAL | Status: DC

## 2019-08-09 NOTE — Progress Notes (Signed)
  Echocardiogram 2D Echocardiogram limited has been performed.  Darlina Sicilian M 08/09/2019, 8:13 AM

## 2019-08-09 NOTE — Plan of Care (Signed)
Problem: Education: Goal: Knowledge of General Education information will improve Description: Including pain rating scale, medication(s)/side effects and non-pharmacologic comfort measures 08/09/2019 1400 by Glenard Haring, RN Outcome: Adequate for Discharge 08/09/2019 1057 by Glenard Haring, RN Outcome: Progressing   Problem: Health Behavior/Discharge Planning: Goal: Ability to manage health-related needs will improve 08/09/2019 1400 by Glenard Haring, RN Outcome: Adequate for Discharge 08/09/2019 1057 by Glenard Haring, RN Outcome: Progressing   Problem: Clinical Measurements: Goal: Ability to maintain clinical measurements within normal limits will improve 08/09/2019 1400 by Glenard Haring, RN Outcome: Adequate for Discharge 08/09/2019 1057 by Glenard Haring, RN Outcome: Progressing Goal: Will remain free from infection 08/09/2019 1400 by Glenard Haring, RN Outcome: Adequate for Discharge 08/09/2019 1057 by Glenard Haring, RN Outcome: Progressing Goal: Diagnostic test results will improve 08/09/2019 1400 by Glenard Haring, RN Outcome: Adequate for Discharge 08/09/2019 1057 by Glenard Haring, RN Outcome: Progressing Goal: Respiratory complications will improve 08/09/2019 1400 by Glenard Haring, RN Outcome: Adequate for Discharge 08/09/2019 1057 by Glenard Haring, RN Outcome: Progressing Goal: Cardiovascular complication will be avoided 08/09/2019 1400 by Glenard Haring, RN Outcome: Adequate for Discharge 08/09/2019 1057 by Glenard Haring, RN Outcome: Progressing   Problem: Clinical Measurements: Goal: Will remain free from infection 08/09/2019 1400 by Glenard Haring, RN Outcome: Adequate for Discharge 08/09/2019 1057 by Glenard Haring, RN Outcome: Progressing   Problem: Clinical Measurements: Goal: Diagnostic test results will improve 08/09/2019 1400 by Glenard Haring, RN Outcome: Adequate for Discharge 08/09/2019 1057 by  Glenard Haring, RN Outcome: Progressing   Problem: Activity: Goal: Risk for activity intolerance will decrease 08/09/2019 1400 by Glenard Haring, RN Outcome: Adequate for Discharge 08/09/2019 1057 by Glenard Haring, RN Outcome: Progressing   Problem: Nutrition: Goal: Adequate nutrition will be maintained 08/09/2019 1400 by Glenard Haring, RN Outcome: Adequate for Discharge 08/09/2019 1057 by Glenard Haring, RN Outcome: Progressing   Problem: Coping: Goal: Level of anxiety will decrease 08/09/2019 1400 by Glenard Haring, RN Outcome: Adequate for Discharge 08/09/2019 1057 by Glenard Haring, RN Outcome: Progressing   Problem: Elimination: Goal: Will not experience complications related to bowel motility 08/09/2019 1400 by Glenard Haring, RN Outcome: Adequate for Discharge 08/09/2019 1057 by Glenard Haring, RN Outcome: Progressing Goal: Will not experience complications related to urinary retention 08/09/2019 1400 by Glenard Haring, RN Outcome: Adequate for Discharge 08/09/2019 1057 by Glenard Haring, RN Outcome: Progressing   Problem: Pain Managment: Goal: General experience of comfort will improve 08/09/2019 1400 by Glenard Haring, RN Outcome: Adequate for Discharge 08/09/2019 1057 by Glenard Haring, RN Outcome: Progressing   Problem: Safety: Goal: Ability to remain free from injury will improve 08/09/2019 1400 by Glenard Haring, RN Outcome: Adequate for Discharge 08/09/2019 1057 by Glenard Haring, RN Outcome: Progressing   Problem: Skin Integrity: Goal: Risk for impaired skin integrity will decrease 08/09/2019 1400 by Glenard Haring, RN Outcome: Adequate for Discharge 08/09/2019 1057 by Glenard Haring, RN Outcome: Progressing   Problem: Education: Goal: Understanding of CV disease, CV risk reduction, and recovery process will improve 08/09/2019 1400 by Glenard Haring, RN Outcome: Adequate for Discharge 08/09/2019 1057 by  Glenard Haring, RN Outcome: Progressing Goal: Individualized Educational Video(s) 08/09/2019 1400 by Glenard Haring, RN Outcome: Adequate for Discharge 08/09/2019 1057 by Glenard Haring, RN Outcome: Progressing   Problem: Education: Goal: Individualized Educational Video(s) 08/09/2019 1400  by Glenard Haring, RN Outcome: Adequate for Discharge 08/09/2019 1057 by Glenard Haring, RN Outcome: Progressing   Problem: Activity: Goal: Ability to return to baseline activity level will improve 08/09/2019 1400 by Glenard Haring, RN Outcome: Adequate for Discharge 08/09/2019 1057 by Glenard Haring, RN Outcome: Progressing   Problem: Cardiovascular: Goal: Ability to achieve and maintain adequate cardiovascular perfusion will improve 08/09/2019 1400 by Glenard Haring, RN Outcome: Adequate for Discharge 08/09/2019 1057 by Glenard Haring, RN Outcome: Progressing Goal: Vascular access site(s) Level 0-1 will be maintained 08/09/2019 1400 by Glenard Haring, RN Outcome: Adequate for Discharge 08/09/2019 1057 by Glenard Haring, RN Outcome: Progressing   Problem: Health Behavior/Discharge Planning: Goal: Ability to safely manage health-related needs after discharge will improve 08/09/2019 1400 by Glenard Haring, RN Outcome: Adequate for Discharge 08/09/2019 1057 by Glenard Haring, RN Outcome: Progressing

## 2019-08-09 NOTE — Progress Notes (Signed)
Discharge instructions given to [name].  Discussed new medications, medication changes and side effects.  Discussed activities and signs and symptoms to watch for and when to contact the physician.  Discussed follow up appointments.  Verbalized understanding.

## 2019-08-09 NOTE — Discharge Summary (Signed)
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 statistically likely benign. No follow-up needed if patient is low-risk (and has no known or suspected primary neoplasm). Non-contrast chest CT can be considered in 12 months if patient is high-risk. This recommendation follows the consensus statement: Guidelines for Management of Incidental Pulmonary Nodules Detected on CT Images: From the Fleischner Society 2017; Radiology 2017; 284:228-243. 4. Cirrhosis. 5. Aortic atherosclerosis, in addition to three vessel coronary artery disease. Please note that although the presence of coronary artery calcium documents the presence of coronary artery disease, the severity of this disease and any potential stenosis cannot be assessed on this non-gated CT examination. Assessment for potential risk factor modification, dietary therapy or pharmacologic therapy may be warranted, if clinically indicated. 6. Additional incidental findings, as above. Electronically Signed   By: Vinnie Langton M.D.   On: 07/28/2019 13:01   Disposition   Pt is being discharged home today in good condition.  Follow-up Plans & Appointments    Follow-up  Information    Eileen Stanford, PA-C. Go on 08/16/2019.   Specialties: Cardiology, Radiology Why: @ 3:30pm, please arrive at least 10 minutes early.  Contact information: [address] [place] [address] [phone]          Discharge Instructions    Amb Referral to Cardiac Rehabilitation   Complete by: As directed    Diagnosis: Valve  Replacement   Valve: Aortic Comment - TAVR   After initial evaluation and assessments completed: Virtual Based Care may be provided alone or in conjunction with Phase 2 Cardiac Rehab based on patient barriers.: Yes      Discharge Medications   Allergies as of 08/09/2019      Reactions   Bee Venom Anaphylaxis   Bystolic [nebivolol Hcl] Other (See Comments)   Nightmares, flashbacks (PTSD)   Codeine Shortness Of Breath, Rash   Doxycycline Shortness Of Breath, Rash   Iohexol Hives, Shortness Of Breath, Rash   Neck and Torso erythemia IVP-DYE >> SHORTNESS OF BREATH   Ivp Dye [iodinated Diagnostic Agents] Shortness Of Breath   Lost consciousness/difficulty breathing Omni - Paque Contrast    Levaquin [levofloxacin] Hives, Shortness Of Breath   Oxycodone Hcl Shortness Of Breath, Swelling, Rash   Stadol [butorphanol] Shortness Of Breath   Succinylcholine Chloride Shortness Of Breath, Nausea Only, Rash, Other (See Comments)   Difficulty breathing   Atorvastatin    myalgias   Synvisc [hylan G-f 20] Hives, Rash   Tamiflu [oseltamivir Phosphate]    UNSPECIFIED REACTION    Dilaudid [hydromorphone Hcl] Nausea Only, Rash   Aggressive   Methocarbamol Nausea Only, Rash   Pentazocine Lactate Rash   rash      Medication List    STOP taking these medications   predniSONE 50 MG tablet Commonly known as: DELTASONE     TAKE these medications   albuterol 0.63 MG/3ML nebulizer solution Commonly known as: ACCUNEB Take 1 ampule by nebulization every 6 (six) hours as needed for wheezing or shortness of breath.   Albuterol  Sulfate 108 (90 Base) MCG/ACT Aepb Inhale 1-2 puffs into the lungs every 4 (four) hours as needed (wheezing/shortness of breath.).   ALPRAZolam 1 MG tablet Commonly known as: XANAX Take 1 tablet (1 mg total) by mouth 3 (three) times daily as needed for anxiety or sleep. What changed:   how much to take  when to take this  reasons to take this   apixaban 5 MG Tabs tablet Commonly known as: Eliquis Take 1 tablet (5 mg total) by mouth 2 (two) times daily.   aspirin 81 MG chewable tablet Chew 1 tablet (81 mg total) by mouth daily.   beclomethasone 42 MCG/SPRAY nasal spray Commonly known as: BECONASE-AQ Place 1 spray into both nostrils daily.   budesonide-formoterol 160-4.5 MCG/ACT inhaler Commonly known as: SYMBICORT INHALE 2 PUFFS INTO THE LUNGS TWICE A DAY RINSE MOUTH WELL AFTER USE What changed:   how much to take  how to take this  when to take this  reasons to take this  additional instructions   carvedilol 25 MG tablet Commonly known as: COREG Take 1 tablet (25 mg total) by mouth 2 (two) times daily.   Flutter Devi Use as directed   furosemide 20 MG tablet Commonly known as: LASIX Take 1 tablet (20 mg total) by mouth daily. MAY TAKE ADDITIONAL TABLET AS NEEDED SWELLING OR SHORTNESS OF BREATH What changed:   when to take this  reasons to take this  additional instructions   irbesartan 300 MG tablet Commonly known as: AVAPRO TAKE 1 TABLET BY MOUTH EVERY DAY   Lubricant Eye Drops 0.4-0.3 % Soln Generic drug: Polyethyl Glycol-Propyl Glycol Place 1 drop into both eyes 3 (three) times daily as needed (allergy eyes/irritation.).   morphine 15 MG tablet Commonly known as: MSIR Take 15 mg by mouth every 4 (four) hours as needed (pain.).   multivitamin with  minerals Tabs tablet Take 1 tablet by mouth at bedtime.   nitroGLYCERIN 0.4 MG SL tablet Commonly known as: NITROSTAT Place 1 tablet (0.4 mg total) under the tongue every 5 (five) minutes as  needed for chest pain.   polyethylene glycol 17 g packet Commonly known as: MIRALAX / GLYCOLAX Take 17 g by mouth 2 (two) times daily as needed for moderate constipation (constipation).   PREVAGEN PO Take 1 tablet by mouth daily.   rosuvastatin 10 MG tablet Commonly known as: CRESTOR TAKE 1 TABLET BY MOUTH EVERY DAY   Symproic 0.2 MG Tabs Generic drug: Naldemedine Tosylate Take 0.2 mg by mouth daily as needed (constipation.).   Vitamin D-3 125 MCG (5000 UT) Tabs Take 15,000 Units by mouth daily.         Outstanding Labs/Studies   none  Duration of Discharge Encounter   Greater than 30 minutes including physician time.  SignedAngelena Form, PA-C 08/09/2019, 10:06 AM [phone]

## 2019-08-09 NOTE — Progress Notes (Signed)
Pt walked hallway this am with his walking stick. No problems, feels well. Discussed restrictions, walking at home, and CRPII. Will refer to [place]. Gave pt HH diet. Not interested in virtual CRPII. [place], ACSM 9:22 AM 08/09/2019

## 2019-08-09 NOTE — Plan of Care (Signed)
  Problem: Health Behavior/Discharge Planning: Goal: Ability to manage health-related needs will improve Outcome: Progressing   Problem: Education: Goal: Knowledge of General Education information will improve Description: Including pain rating scale, medication(s)/side effects and non-pharmacologic comfort measures Outcome: Progressing   Problem: Clinical Measurements: Goal: Ability to maintain clinical measurements within normal limits will improve Outcome: Progressing Goal: Will remain free from infection Outcome: Progressing Goal: Diagnostic test results will improve Outcome: Progressing Goal: Respiratory complications will improve Outcome: Progressing Goal: Cardiovascular complication will be avoided Outcome: Progressing   Problem: Activity: Goal: Risk for activity intolerance will decrease Outcome: Progressing   Problem: Nutrition: Goal: Adequate nutrition will be maintained Outcome: Progressing   Problem: Coping: Goal: Level of anxiety will decrease Outcome: Progressing   Problem: Elimination: Goal: Will not experience complications related to bowel motility Outcome: Progressing Goal: Will not experience complications related to urinary retention Outcome: Progressing   Problem: Pain Managment: Goal: General experience of comfort will improve Outcome: Progressing   Problem: Safety: Goal: Ability to remain free from injury will improve Outcome: Progressing   Problem: Skin Integrity: Goal: Risk for impaired skin integrity will decrease Outcome: Progressing   Problem: Education: Goal: Understanding of CV disease, CV risk reduction, and recovery process will improve Outcome: Progressing Goal: Individualized Educational Video(s) Outcome: Progressing   Problem: Activity: Goal: Ability to return to baseline activity level will improve Outcome: Progressing   Problem: Cardiovascular: Goal: Ability to achieve and maintain adequate cardiovascular perfusion  will improve Outcome: Progressing Goal: Vascular access site(s) Level 0-1 will be maintained Outcome: Progressing   Problem: Health Behavior/Discharge Planning: Goal: Ability to safely manage health-related needs after discharge will improve Outcome: Progressing

## 2019-08-10 ENCOUNTER — Telehealth: Payer: Self-pay | Admitting: Physician Assistant

## 2019-08-10 NOTE — Telephone Encounter (Addendum)
  [facility] TE[facility]   Patient contacted regarding discharge from [facility] on 08/09/19  Patient understands to follow up with provider Nell Range on 3/17 @ 3:30pm at [place].  Patient understands discharge instructions? yes Patient understands medications and regimen? yes Patient understands to bring all medications to this visit? yes  Angelena Form PA-C  [facility]

## 2019-08-14 ENCOUNTER — Telehealth: Payer: Self-pay | Admitting: Cardiovascular Disease

## 2019-08-14 DIAGNOSIS — G894 Chronic pain syndrome: Secondary | ICD-10-CM | POA: Diagnosis not present

## 2019-08-14 DIAGNOSIS — M5136 Other intervertebral disc degeneration, lumbar region: Secondary | ICD-10-CM | POA: Diagnosis not present

## 2019-08-14 DIAGNOSIS — Z79891 Long term (current) use of opiate analgesic: Secondary | ICD-10-CM | POA: Diagnosis not present

## 2019-08-14 DIAGNOSIS — M25562 Pain in left knee: Secondary | ICD-10-CM | POA: Diagnosis not present

## 2019-08-14 NOTE — Telephone Encounter (Signed)
Pt c/o BP issue: STAT if pt c/o blurred vision, one-sided weakness or slurred speech  1. What are your last 5 BP readings?   110/66  169/86  2. Are you having any other symptoms (ex. Dizziness, headache, blurred vision, passed out)? No  3. What is your BP issue? Patient states he would like to make Angelena Form aware that his BP has been elevated and fluctuating. Please advise.

## 2019-08-14 NOTE — Telephone Encounter (Signed)
The patient and his wife are concerned because over the weekend his blood pressure was fluctuating greatly. They went to breakfast, came home and [name] was very tired. He sat down and seemed to have mumbled speech and was very lethargic. She said she put a pulseox on his finger and it read 82% and his lips were bluish tinted. She "tested him for a stroke" but he could smile correctly. Mrs. Demchak took his BP every 20 minutes until he fell asleep. She did not want to call EMS because they are too expensive. She listened to his heart with a stethoscope and said it sounded normal. When he woke up from his nap, he felt better and has felt better since. His BP is still fluctuating though (from about 120s - 160s).  He has been staying hydrated and drinking electrolytes. They have an appointment today with PCP and an appointment later this week with Nell Range. She understand they will be called if KT has recommendations prior to her visit.   Of note, both the patient and his wife were speaking simultaneously on the phone. It was difficult to hear or ask questions before they needed to leave for the doctor's appointment at [facility].

## 2019-08-15 NOTE — Telephone Encounter (Signed)
Noted. Will discuss at outpatient OV this week

## 2019-08-16 ENCOUNTER — Ambulatory Visit: Payer: Medicare HMO | Admitting: Physician Assistant

## 2019-08-16 ENCOUNTER — Encounter: Payer: Self-pay | Admitting: Physician Assistant

## 2019-08-16 ENCOUNTER — Other Ambulatory Visit: Payer: Self-pay | Admitting: Physician Assistant

## 2019-08-16 ENCOUNTER — Other Ambulatory Visit: Payer: Self-pay

## 2019-08-16 VITALS — BP 178/100 | HR 69 | Ht 73.0 in | Wt 238.0 lb

## 2019-08-16 DIAGNOSIS — I10 Essential (primary) hypertension: Secondary | ICD-10-CM

## 2019-08-16 DIAGNOSIS — Z952 Presence of prosthetic heart valve: Secondary | ICD-10-CM

## 2019-08-16 DIAGNOSIS — R918 Other nonspecific abnormal finding of lung field: Secondary | ICD-10-CM

## 2019-08-16 DIAGNOSIS — I480 Paroxysmal atrial fibrillation: Secondary | ICD-10-CM

## 2019-08-16 MED ORDER — APIXABAN 5 MG PO TABS: 5.0000 mg | ORAL_TABLET | Freq: Two times a day (BID) | ORAL | 3 refills | Status: DC

## 2019-08-16 NOTE — Patient Instructions (Signed)
Medication Instructions:  Your provider recommends that you continue on your current medications as directed. Please refer to the Current Medication list given to you today.   *If you need a refill on your cardiac medications before your next appointment, please call your pharmacy*   Follow-Up: Please keep your upcoming appointments as scheduled!
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
## 2019-08-16 NOTE — Telephone Encounter (Signed)
Error

## 2019-08-17 ENCOUNTER — Telehealth: Payer: Self-pay | Admitting: Cardiovascular Disease
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
## 2019-08-18 NOTE — Telephone Encounter (Signed)
Thanks for heads up.  Looks like it is in the correct hands with [facility].  Please let us know if there is anything else we need to do.

## 2019-08-24 ENCOUNTER — Telehealth: Payer: Self-pay | Admitting: Physician Assistant

## 2019-08-24 NOTE — Telephone Encounter (Signed)
Informed patient if lab work needs to be drawn, it can be drawn at the hospital or at his PCP office.  He was grateful for assistance.
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
## 2019-08-28 ENCOUNTER — Telehealth: Payer: Self-pay | Admitting: Cardiovascular Disease

## 2019-08-28 NOTE — Telephone Encounter (Signed)
Spoke with patient who states he is having worsening leg swelling. States left leg is more swollen than right, has hx of left knee surgery so it is not uncommon for the LLE to be more swollen than RLE. He denies weight gain, weight 238 lb this morning Denies SOB. Denies high sodium diet, states he is very careful with his diet. He reports that he is active all day and swelling increases through the day and decreases when he wakes up in the mornings. I asked if he has taken an additional furosemide as directed for increased swelling or weight gain and he states he took 20 mg this morning and plans to take an additional 20 mg in a few hours but wanted to make certain that we are in agreement. I advised him to proceed with his plan, with low sodium diet, activity, and elevating legs when is not ambulatory. I advised him to call back later this week if symptoms do not improve. He verbalized understanding and agreement and thanked me for the call.

## 2019-08-28 NOTE — Telephone Encounter (Signed)
Agree, thank you

## 2019-08-28 NOTE — Telephone Encounter (Signed)
Pt c/o swelling: STAT is pt has developed SOB within 24 hours  1) How much weight have you gained and in what time span? 2lbs over 2 days  2) If swelling, where is the swelling located? Legs swollen from the knee down.   3) Are you currently taking a fluid pill? Yes   4) Are you currently SOB? No   5) Do you have a log of your daily weights (if so, list)?   6) Have you gained 3 pounds in a day or 5 pounds in a week? No   7) Have you traveled recently? No

## 2019-08-28 NOTE — Telephone Encounter (Signed)
Also agree.  William Hew, MD

## 2019-08-29 ENCOUNTER — Other Ambulatory Visit: Payer: Self-pay | Admitting: Cardiology

## 2019-08-29 NOTE — Telephone Encounter (Signed)
*  STAT* If patient is at the pharmacy, call can be transferred to refill team.   1. Which medications need to be refilled? (please list name of each medication and dose if known)  furosemide (LASIX) 20 MG tablet  2. Which pharmacy/location (including street and city if local pharmacy) is medication to be sent to? [id number] [place], [place] - [address]  3. Do they need a 30 day or 90 day supply? 90 day supply   Patient only has 2 tablets left.

## 2019-08-30 ENCOUNTER — Telehealth: Payer: Self-pay | Admitting: Cardiology

## 2019-08-30 MED ORDER — FUROSEMIDE 20 MG PO TABS: 20.0000 mg | ORAL_TABLET | Freq: Every day | ORAL | 3 refills | Status: DC

## 2019-08-30 NOTE — Telephone Encounter (Signed)
New message   Patient needs a new prescription for furosemide (LASIX) 20 MG tablet sent to [id number] [place], [place] - [address]

## 2019-08-30 NOTE — Telephone Encounter (Signed)
Furosemide refill sent to preferred pharmacy.

## 2019-08-31 ENCOUNTER — Other Ambulatory Visit: Payer: Self-pay | Admitting: Cardiology

## 2019-08-31 ENCOUNTER — Other Ambulatory Visit: Payer: Self-pay

## 2019-08-31 ENCOUNTER — Telehealth: Payer: Self-pay | Admitting: Cardiovascular Disease

## 2019-08-31 MED ORDER — FUROSEMIDE 20 MG PO TABS: 20.0000 mg | ORAL_TABLET | Freq: Every day | ORAL | 3 refills | Status: DC

## 2019-08-31 NOTE — Telephone Encounter (Signed)
Patient called and said hid pharmacy does not have the refill for his medication yet.

## 2019-08-31 NOTE — Telephone Encounter (Signed)
Called and spoke with pt, he states that he is frustrated. He called into triage with complaints of swelling on Monday and he thought he had enough of his lasix but apparently did not. He requested a refill of his lasix to be sent in and he states he pharmacy never received it. He reports that he was told by Joellen Jersey and his surgical nurse that he needed to up his fluid pills so he called the pharmacy the next day and they still did not have this medication. He states that his weight yesterday was 238 with a BP of 168/70 and today his weight is 242 with Bps of 167/70 and 179/90.  He states that his chest was "kind of tight last night, similar to what it was prior to my surgery." he states he is a little SOB but not like prior to his surgery and has a little "twinge" in his chest but does not feel like he needs to take his nitro at this time. He states he carries his BP cuff with him and states that his HR is 68 with an o2 of 97-98.  Notified pt that if he has active chest pain then he needs to seek immediate evaluation in the ER.  He states that his swelling is down from 3 days ago and he wore his ted stockings and did postural drainage and that helped some, but he states that in his Left leg it is "banding" and the swelling is "compartmentalized" but is decreased from the day before yesterday a his right leg is half the size of the left.  Pt states that he can get a full breath but sometimes feels like there is a small elephant on his chest. Notified pt again that if he has active chest pain to seek evaluation in the nearest emergency room. Pt stated that " you can bet your next paycheck that if I start to have chest pain I will go to the ER."  Notified pt that I would contact the pharmacy to make sure they received the refill for his lasix that I had sent in today and that per our swelling protocol for him to take an extra dose once he receives the medication. Pt verbalized understanding for lasix and to go to  the ER with active chest pain. No other questions at this time. Will route to [place] and Dr.Cooper for review.

## 2019-08-31 NOTE — Telephone Encounter (Signed)
Pt c/o swelling: STAT is pt has developed SOB within 24 hours  1) How much weight have you gained and in what time span? 4 pounds overnight  2) If swelling, where is the swelling located? legs  3) Are you currently taking a fluid pill? No. The rx he had was expired and he could not refill it. See phone encounter from 08-30-19  4) Are you currently SOB? no  5) Do you have a log of your daily weights (if so, list)?   Yesterday: 238 161/91 BP   Today: 242 167/72 BP  6) Have you gained 3 pounds in a day or 5 pounds in a week? 4 pounds overnight   7) Have you traveled recently? no

## 2019-08-31 NOTE — Telephone Encounter (Signed)
Can change his Rx to:  Lasix 20 mg tab po BID as directed; Disp: "180 tab", 4 refills.  This will allow him to take 1-2 tabs daily & not run out.  [name], MD

## 2019-08-31 NOTE — Telephone Encounter (Signed)
Called and left voicemail notifying pt that the pharmacy did receive his prescription for lasix and he would be able to pick this up today.

## 2019-09-01 MED ORDER — FUROSEMIDE 20 MG PO TABS: 20.0000 mg | ORAL_TABLET | Freq: Two times a day (BID) | ORAL | 4 refills | Status: DC

## 2019-09-01 NOTE — Telephone Encounter (Signed)
Changed RX on file per Dr.Harding. Contacted patient, LVM advising of this change- and sent to pharmacy.  Left call back number if questions or concerns.
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
## 2019-09-06 ENCOUNTER — Other Ambulatory Visit: Payer: Self-pay

## 2019-09-06 ENCOUNTER — Ambulatory Visit (INDEPENDENT_AMBULATORY_CARE_PROVIDER_SITE_OTHER): Payer: Medicare HMO | Admitting: Physician Assistant

## 2019-09-06 ENCOUNTER — Encounter: Payer: Self-pay | Admitting: Physician Assistant

## 2019-09-06 ENCOUNTER — Ambulatory Visit (HOSPITAL_COMMUNITY): Payer: Medicare HMO | Attending: Cardiology

## 2019-09-06 VITALS — BP 184/96 | HR 74 | Ht 73.5 in | Wt 236.0 lb

## 2019-09-06 DIAGNOSIS — Z952 Presence of prosthetic heart valve: Secondary | ICD-10-CM

## 2019-09-06 DIAGNOSIS — R918 Other nonspecific abnormal finding of lung field: Secondary | ICD-10-CM | POA: Diagnosis not present

## 2019-09-06 DIAGNOSIS — I480 Paroxysmal atrial fibrillation: Secondary | ICD-10-CM

## 2019-09-06 DIAGNOSIS — I10 Essential (primary) hypertension: Secondary | ICD-10-CM

## 2019-09-06 MED ORDER — SPIRONOLACTONE 25 MG PO TABS: 12.5000 mg | ORAL_TABLET | Freq: Every day | ORAL | 3 refills | Status: DC

## 2019-09-06 NOTE — Patient Instructions (Addendum)
Medication Instructions:  1) START SPIRONOLACTONE 12.5 mg daily 2) You may STOP ASPIRIN 02/08/2020 *If you need a refill on your cardiac medications before your next appointment, please call your pharmacy*  Lab Work: TODAY: BMET  Follow-Up: You are scheduled for a HTN CLINIC visit on 4/22 at 2:00PM.  We will call you to arrange 1 year visits.

## 2019-09-07 ENCOUNTER — Telehealth: Payer: Self-pay

## 2019-09-07 LAB — BASIC METABOLIC PANEL
BUN/Creatinine Ratio: 17 (ref 10–24)
BUN: 19 mg/dL (ref 8–27)
CO2: 25 mmol/L (ref 20–29)
Calcium: 9.8 mg/dL (ref 8.6–10.2)
Chloride: 102 mmol/L (ref 96–106)
Creatinine, Ser: 1.1 mg/dL (ref 0.76–1.27)
GFR calc Af Amer: 79 mL/min/{1.73_m2} (ref >59)
GFR calc non Af Amer: 68 mL/min/{1.73_m2} (ref >59)
Glucose: 95 mg/dL (ref 65–99)
Potassium: 4.1 mmol/L (ref 3.5–5.2)
Sodium: 143 mmol/L (ref 134–144)

## 2019-09-07 NOTE — Telephone Encounter (Signed)
The patient has been notified of the lab result and verbalized understanding.  All questions (if any) were answered. Frederik Schmidt, RN 09/07/2019 9:15 AM

## 2019-09-07 NOTE — Telephone Encounter (Signed)
-----   Message from Eileen Stanford, PA-C sent at 09/07/2019  9:07 AM EDT ----- Labs look good. Will need repeat BMET when sees HTN clinic given new start of spiro

## 2019-09-08 ENCOUNTER — Encounter: Payer: Medicare HMO | Attending: Cardiology | Admitting: *Deleted

## 2019-09-08 ENCOUNTER — Other Ambulatory Visit: Payer: Self-pay

## 2019-09-08 DIAGNOSIS — Z952 Presence of prosthetic heart valve: Secondary | ICD-10-CM

## 2019-09-08 NOTE — Progress Notes (Signed)
Started Virtual orientation- shared his 45$ co-pay. 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 We reviewed the [place] covering is Cardiac Rehab.  He has an appt on May 17th with a new doctor at the at the [place].  He will check on [place] authorization for the program.  [facility]  appoint[facility] were [facility].  He has our number to call if any changes with is May 17th appointment.

## 2019-09-12 ENCOUNTER — Ambulatory Visit: Payer: Medicare HMO

## 2019-09-14 DIAGNOSIS — Z79891 Long term (current) use of opiate analgesic: Secondary | ICD-10-CM | POA: Diagnosis not present

## 2019-09-14 DIAGNOSIS — M25562 Pain in left knee: Secondary | ICD-10-CM | POA: Diagnosis not present

## 2019-09-14 DIAGNOSIS — M5136 Other intervertebral disc degeneration, lumbar region: Secondary | ICD-10-CM | POA: Diagnosis not present

## 2019-09-14 DIAGNOSIS — G894 Chronic pain syndrome: Secondary | ICD-10-CM | POA: Diagnosis not present

## 2019-09-19 DIAGNOSIS — R55 Syncope and collapse: Secondary | ICD-10-CM | POA: Diagnosis not present

## 2019-09-19 DIAGNOSIS — R4182 Altered mental status, unspecified: Secondary | ICD-10-CM | POA: Diagnosis not present

## 2019-09-19 DIAGNOSIS — R69 Illness, unspecified: Secondary | ICD-10-CM | POA: Diagnosis not present

## 2019-09-19 DIAGNOSIS — E785 Hyperlipidemia, unspecified: Secondary | ICD-10-CM | POA: Diagnosis not present

## 2019-09-21 ENCOUNTER — Ambulatory Visit: Payer: Medicare HMO
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
## 2019-09-28 NOTE — Patient Instructions (Addendum)
It was great meeting you today!  We will check a BMET today  Your blood pressure goal is <130/80  Please continue your spironolactone 25mg  1/2 tablet once daily Continue carvedilol 25 mg twice a day Continue furosemide 20 mg daily Continue irbesartan 300 mg daily  Remember to watch your salt intake and continue being physically active.  We have sent in lab orders to check your kidneys and electrolytes  Please call with any questions [phone]
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
## 2019-09-29 ENCOUNTER — Ambulatory Visit (INDEPENDENT_AMBULATORY_CARE_PROVIDER_SITE_OTHER): Payer: Medicare HMO | Admitting: Pharmacist

## 2019-09-29 ENCOUNTER — Other Ambulatory Visit: Payer: Self-pay

## 2019-09-29 VITALS — BP 110/62 | HR 69 | Wt 237.5 lb

## 2019-09-29 DIAGNOSIS — I10 Essential (primary) hypertension: Secondary | ICD-10-CM

## 2019-09-30 LAB — BASIC METABOLIC PANEL
BUN/Creatinine Ratio: 17 (ref 10–24)
BUN: 19 mg/dL (ref 8–27)
CO2: 27 mmol/L (ref 20–29)
Calcium: 9.6 mg/dL (ref 8.6–10.2)
Chloride: 102 mmol/L (ref 96–106)
Creatinine, Ser: 1.09 mg/dL (ref 0.76–1.27)
GFR calc Af Amer: 80 mL/min/{1.73_m2} (ref >59)
GFR calc non Af Amer: 69 mL/min/{1.73_m2} (ref >59)
Glucose: 105 mg/dL — ABNORMAL HIGH (ref 65–99)
Potassium: 4.6 mmol/L (ref 3.5–5.2)
Sodium: 142 mmol/L (ref 134–144)

## 2019-10-17 DIAGNOSIS — Z79891 Long term (current) use of opiate analgesic: Secondary | ICD-10-CM | POA: Diagnosis not present

## 2019-10-17 DIAGNOSIS — G894 Chronic pain syndrome: Secondary | ICD-10-CM | POA: Diagnosis not present

## 2019-10-17 DIAGNOSIS — H5702 Anisocoria: Secondary | ICD-10-CM | POA: Diagnosis not present

## 2019-10-17 DIAGNOSIS — E785 Hyperlipidemia, unspecified: Secondary | ICD-10-CM | POA: Diagnosis not present

## 2019-10-17 DIAGNOSIS — M25562 Pain in left knee: Secondary | ICD-10-CM | POA: Diagnosis not present

## 2019-10-17 DIAGNOSIS — R4182 Altered mental status, unspecified: Secondary | ICD-10-CM | POA: Diagnosis not present

## 2019-10-17 DIAGNOSIS — R55 Syncope and collapse: Secondary | ICD-10-CM | POA: Diagnosis not present

## 2019-10-17 DIAGNOSIS — M5136 Other intervertebral disc degeneration, lumbar region: Secondary | ICD-10-CM | POA: Diagnosis not present

## 2019-11-14 DIAGNOSIS — T2124XA Burn of second degree of lower back, initial encounter: Secondary | ICD-10-CM | POA: Diagnosis not present

## 2019-11-14 DIAGNOSIS — E785 Hyperlipidemia, unspecified: Secondary | ICD-10-CM | POA: Diagnosis not present

## 2019-11-14 DIAGNOSIS — R55 Syncope and collapse: Secondary | ICD-10-CM | POA: Diagnosis not present

## 2019-11-14 DIAGNOSIS — R4182 Altered mental status, unspecified: Secondary | ICD-10-CM | POA: Diagnosis not present

## 2019-11-15 DIAGNOSIS — M5136 Other intervertebral disc degeneration, lumbar region: Secondary | ICD-10-CM | POA: Diagnosis not present

## 2019-11-15 DIAGNOSIS — G894 Chronic pain syndrome: Secondary | ICD-10-CM | POA: Diagnosis not present

## 2019-11-15 DIAGNOSIS — Z79891 Long term (current) use of opiate analgesic: Secondary | ICD-10-CM | POA: Diagnosis not present

## 2019-11-15 DIAGNOSIS — M25562 Pain in left knee: Secondary | ICD-10-CM | POA: Diagnosis not present

## 2019-11-28 ENCOUNTER — Other Ambulatory Visit: Payer: Self-pay | Admitting: Cardiology

## 2019-12-12 DIAGNOSIS — E785 Hyperlipidemia, unspecified: Secondary | ICD-10-CM | POA: Diagnosis not present

## 2019-12-12 DIAGNOSIS — S0990XS Unspecified injury of head, sequela: Secondary | ICD-10-CM | POA: Diagnosis not present

## 2019-12-12 DIAGNOSIS — W57XXXA Bitten or stung by nonvenomous insect and other nonvenomous arthropods, initial encounter: Secondary | ICD-10-CM | POA: Diagnosis not present

## 2019-12-12 DIAGNOSIS — I4891 Unspecified atrial fibrillation: Secondary | ICD-10-CM | POA: Diagnosis not present

## 2019-12-12 DIAGNOSIS — J449 Chronic obstructive pulmonary disease, unspecified: Secondary | ICD-10-CM | POA: Diagnosis not present

## 2019-12-12 DIAGNOSIS — I2510 Atherosclerotic heart disease of native coronary artery without angina pectoris: Secondary | ICD-10-CM | POA: Diagnosis not present

## 2019-12-12 DIAGNOSIS — K5900 Constipation, unspecified: Secondary | ICD-10-CM | POA: Diagnosis not present

## 2019-12-12 DIAGNOSIS — R4182 Altered mental status, unspecified: Secondary | ICD-10-CM | POA: Diagnosis not present

## 2019-12-12 DIAGNOSIS — R55 Syncope and collapse: Secondary | ICD-10-CM | POA: Diagnosis not present

## 2019-12-12 DIAGNOSIS — R69 Illness, unspecified: Secondary | ICD-10-CM | POA: Diagnosis not present

## 2019-12-12 DIAGNOSIS — E119 Type 2 diabetes mellitus without complications: Secondary | ICD-10-CM | POA: Diagnosis not present

## 2019-12-12 DIAGNOSIS — E039 Hypothyroidism, unspecified: Secondary | ICD-10-CM | POA: Diagnosis not present

## 2019-12-13 DIAGNOSIS — Z79891 Long term (current) use of opiate analgesic: Secondary | ICD-10-CM | POA: Diagnosis not present

## 2019-12-13 DIAGNOSIS — M25562 Pain in left knee: Secondary | ICD-10-CM | POA: Diagnosis not present

## 2019-12-13 DIAGNOSIS — G894 Chronic pain syndrome: Secondary | ICD-10-CM | POA: Diagnosis not present

## 2019-12-13 DIAGNOSIS — M5136 Other intervertebral disc degeneration, lumbar region: Secondary | ICD-10-CM | POA: Diagnosis not present

## 2020-01-01 DIAGNOSIS — R69 Illness, unspecified: Secondary | ICD-10-CM | POA: Diagnosis not present

## 2020-01-15 ENCOUNTER — Telehealth: Payer: Self-pay | Admitting: Cardiovascular Disease

## 2020-01-15 NOTE — Telephone Encounter (Signed)
Most recent QTc is within normal limits and no significant drug-drug interaction expected. Blood pressure well controlled on 09/29/2019 as well.   Okay to take sertraline and prescribed.

## 2020-01-15 NOTE — Telephone Encounter (Signed)
New message:    Patient calling having some concerns to make sure that is nothing else he need to be done.

## 2020-01-15 NOTE — Telephone Encounter (Signed)
Agree. thanks

## 2020-01-15 NOTE — Telephone Encounter (Signed)
I spoke with patient. He reports he is followed by Dr Annamaria Boots in pulmonary and also Dr Darron Doom as his PCP.  He developed a cough 2 weeks ago and was diagnosed with bronchitis.  Treated with Z pack and then another antibiotic.  Bronchitis is slowly improving.  Has PTSD and was recently diagnosed with sudden anger episodes.  He is being treated with sertraline.  He reports he has reviewed with pharmacist and was told it was OK to take.  Patient wanted to update both Dr Burt Knack and Dr Ellyn Hack on this and make sure from a heart standpoint it was OK to take sertraline. [facility] and [facility]

## 2020-01-15 NOTE — Telephone Encounter (Signed)
Informed patient it is OK to take sertraline as prescribed if he'd like. He was grateful for call.

## 2020-01-17 DIAGNOSIS — F319 Bipolar disorder, unspecified: Secondary | ICD-10-CM | POA: Diagnosis not present

## 2020-01-17 DIAGNOSIS — R4182 Altered mental status, unspecified: Secondary | ICD-10-CM | POA: Diagnosis not present

## 2020-01-17 DIAGNOSIS — E785 Hyperlipidemia, unspecified: Secondary | ICD-10-CM | POA: Diagnosis not present

## 2020-01-17 DIAGNOSIS — R69 Illness, unspecified: Secondary | ICD-10-CM | POA: Diagnosis not present

## 2020-01-19 ENCOUNTER — Telehealth: Payer: Self-pay | Admitting: Internal Medicine
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
## 2020-01-29 DIAGNOSIS — Z79891 Long term (current) use of opiate analgesic: Secondary | ICD-10-CM | POA: Diagnosis not present

## 2020-01-29 DIAGNOSIS — G894 Chronic pain syndrome: Secondary | ICD-10-CM | POA: Diagnosis not present

## 2020-01-29 DIAGNOSIS — M5136 Other intervertebral disc degeneration, lumbar region: Secondary | ICD-10-CM | POA: Diagnosis not present

## 2020-01-29 DIAGNOSIS — M25562 Pain in left knee: Secondary | ICD-10-CM | POA: Diagnosis not present

## 2020-02-02 DIAGNOSIS — S00411A Abrasion of right ear, initial encounter: Secondary | ICD-10-CM | POA: Diagnosis not present

## 2020-02-02 DIAGNOSIS — Z7982 Long term (current) use of aspirin: Secondary | ICD-10-CM | POA: Diagnosis not present

## 2020-02-09 DIAGNOSIS — S00411D Abrasion of right ear, subsequent encounter: Secondary | ICD-10-CM | POA: Diagnosis not present

## 2020-02-29 DIAGNOSIS — Z79891 Long term (current) use of opiate analgesic: Secondary | ICD-10-CM | POA: Diagnosis not present

## 2020-02-29 DIAGNOSIS — M25562 Pain in left knee: Secondary | ICD-10-CM | POA: Diagnosis not present

## 2020-02-29 DIAGNOSIS — M5136 Other intervertebral disc degeneration, lumbar region: Secondary | ICD-10-CM | POA: Diagnosis not present

## 2020-02-29 DIAGNOSIS — G894 Chronic pain syndrome: Secondary | ICD-10-CM | POA: Diagnosis not present

## 2020-03-01 DIAGNOSIS — F319 Bipolar disorder, unspecified: Secondary | ICD-10-CM | POA: Diagnosis not present

## 2020-03-01 DIAGNOSIS — E785 Hyperlipidemia, unspecified: Secondary | ICD-10-CM | POA: Diagnosis not present

## 2020-03-01 DIAGNOSIS — R4182 Altered mental status, unspecified: Secondary | ICD-10-CM | POA: Diagnosis not present

## 2020-03-01 DIAGNOSIS — R69 Illness, unspecified: Secondary | ICD-10-CM | POA: Diagnosis not present

## 2020-03-14 ENCOUNTER — Telehealth: Payer: Self-pay | Admitting: Cardiovascular Disease

## 2020-03-14 NOTE — Telephone Encounter (Signed)
    Pt would like to speak with Valetta Fuller, he said he is experiencing some issue since TAVR surgery.
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
## 2020-03-16 NOTE — Telephone Encounter (Signed)
I'm sorry he's having trouble. I agree that a psychiatry evaluation would be helpful. He would probably benefit from counseling and medication adjustment. I don't have any expertise in this area but would recommend calling PCP for referral. If no PCP we could refer him to [facility]. thx

## 2020-03-18 NOTE — Telephone Encounter (Signed)
Reiterated to the patient he likely needs a psychiatric evaluation or counseling and he agreed. He will contact his GP with the [facility] again to discuss options. If they have no options he likes, he will call back for a Behavioral Health referral. He was grateful for assistance.

## 2020-03-28 ENCOUNTER — Emergency Department (HOSPITAL_COMMUNITY)
Admission: EM | Admit: 2020-03-28 | Discharge: 2020-03-28 | Disposition: A | Discharge status: Home / Self Care | Payer: Medicare HMO | Attending: Emergency Medicine | Admitting: Emergency Medicine

## 2020-03-28 ENCOUNTER — Emergency Department (HOSPITAL_COMMUNITY): Payer: Medicare HMO

## 2020-03-28 DIAGNOSIS — M542 Cervicalgia: Secondary | ICD-10-CM | POA: Diagnosis not present

## 2020-03-28 DIAGNOSIS — J3489 Other specified disorders of nose and nasal sinuses: Secondary | ICD-10-CM | POA: Diagnosis not present

## 2020-03-28 DIAGNOSIS — S0101XA Laceration without foreign body of scalp, initial encounter: Secondary | ICD-10-CM | POA: Insufficient documentation

## 2020-03-28 DIAGNOSIS — Y92002 Bathroom of unspecified non-institutional (private) residence single-family (private) house as the place of occurrence of the external cause: Secondary | ICD-10-CM | POA: Insufficient documentation

## 2020-03-28 DIAGNOSIS — G4489 Other headache syndrome: Secondary | ICD-10-CM | POA: Diagnosis not present

## 2020-03-28 DIAGNOSIS — S0990XA Unspecified injury of head, initial encounter: Secondary | ICD-10-CM | POA: Diagnosis not present

## 2020-03-28 DIAGNOSIS — W182XXA Fall in (into) shower or empty bathtub, initial encounter: Secondary | ICD-10-CM | POA: Insufficient documentation

## 2020-03-28 DIAGNOSIS — Z7901 Long term (current) use of anticoagulants: Secondary | ICD-10-CM | POA: Insufficient documentation

## 2020-03-28 DIAGNOSIS — R58 Hemorrhage, not elsewhere classified: Secondary | ICD-10-CM | POA: Diagnosis not present

## 2020-03-28 DIAGNOSIS — R42 Dizziness and giddiness: Secondary | ICD-10-CM | POA: Diagnosis not present

## 2020-03-28 DIAGNOSIS — R519 Headache, unspecified: Secondary | ICD-10-CM | POA: Diagnosis not present

## 2020-03-28 DIAGNOSIS — S0291XA Unspecified fracture of skull, initial encounter for closed fracture: Secondary | ICD-10-CM | POA: Diagnosis not present

## 2020-03-28 DIAGNOSIS — S199XXA Unspecified injury of neck, initial encounter: Secondary | ICD-10-CM | POA: Diagnosis not present

## 2020-03-28 DIAGNOSIS — R52 Pain, unspecified: Secondary | ICD-10-CM | POA: Diagnosis not present

## 2020-03-28 DIAGNOSIS — R9431 Abnormal electrocardiogram [ECG] [EKG]: Secondary | ICD-10-CM | POA: Diagnosis not present

## 2020-03-28 DIAGNOSIS — W19XXXA Unspecified fall, initial encounter: Secondary | ICD-10-CM

## 2020-03-28 LAB — BASIC METABOLIC PANEL
Anion gap: 13 (ref 5–15)
BUN: 21 mg/dL (ref 8–23)
CO2: 19 mmol/L — ABNORMAL LOW (ref 22–32)
Calcium: 9.7 mg/dL (ref 8.9–10.3)
Chloride: 104 mmol/L (ref 98–111)
Creatinine, Ser: 0.99 mg/dL (ref 0.61–1.24)
GFR, Estimated: >60 mL/min (ref >60)
Glucose, Bld: 116 mg/dL — ABNORMAL HIGH (ref 70–99)
Potassium: 3.9 mmol/L (ref 3.5–5.1)
Sodium: 136 mmol/L (ref 135–145)

## 2020-03-28 LAB — CBC WITH DIFFERENTIAL/PLATELET
Abs Immature Granulocytes: 0.05 10*3/uL (ref 0.00–0.07)
Basophils Absolute: 0.1 10*3/uL (ref 0.0–0.1)
Basophils Relative: 0 %
Eosinophils Absolute: 0.4 10*3/uL (ref 0.0–0.5)
Eosinophils Relative: 4 %
HCT: 42.4 % (ref 39.0–52.0)
Hemoglobin: 13.7 g/dL (ref 13.0–17.0)
Immature Granulocytes: 0 %
Lymphocytes Relative: 15 %
Lymphs Abs: 1.6 10*3/uL (ref 0.7–4.0)
MCH: 29.3 pg (ref 26.0–34.0)
MCHC: 32.3 g/dL (ref 30.0–36.0)
MCV: 90.6 fL (ref 80.0–100.0)
Monocytes Absolute: 0.8 10*3/uL (ref 0.1–1.0)
Monocytes Relative: 7 %
Neutro Abs: 8.4 10*3/uL — ABNORMAL HIGH (ref 1.7–7.7)
Neutrophils Relative %: 74 %
Platelets: 194 10*3/uL (ref 150–400)
RBC: 4.68 MIL/uL (ref 4.22–5.81)
RDW: 13 % (ref 11.5–15.5)
WBC: 11.3 10*3/uL — ABNORMAL HIGH (ref 4.0–10.5)
nRBC: 0 % (ref 0.0–0.2)

## 2020-03-28 MED ORDER — LIDOCAINE-EPINEPHRINE 1 %-1:100000 IJ SOLN
30.0000 mL | Freq: Once | INTRAMUSCULAR | Status: AC
  Administered 2020-03-28: 30 mL via INTRADERMAL
  Filled 2020-03-28: qty 1
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
## 2020-03-28 NOTE — ED Notes (Signed)
Pt given antibiotic ointment and gauze to go home w, advised to take tylenol for pain and have staples removed in 1-2 weeks. Pt understood, given dc papers and leaving w wife

## 2020-03-28 NOTE — ED Provider Notes (Signed)
[facility] EMERGENCY DEPARTMENT Provider Note   CSN: [id number] Arrival date & time: 03/28/20  1535     History Chief Complaint  Patient presents with  . Fall    On thinners     [name] is a 69 y.o. male.  [name] is a 69 year old man who presents to the ED after a fall in the shower today.  He has a previous medical history significant for aortic stenosis s/p TAVR procedure and A. fib on anticoagulation with apixaban.  He reports that he was in his normal state of health today taking a shower when he lost his footing.  Believes that he lost his footing due to his lower extremity neuropathy.  He denies any lightheadedness or dizziness leading to this fall.  He ended up falling out of the shower and hitting the back of his head and neck on the toilet.  After hitting his head, he lost consciousness and landed on the floor.  His wife found him shortly afterward and he regained consciousness after less than 1 minute.  Once he regained consciousness, he noticed that he did have a laceration on the back of his head, some blurred vision and mild nausea.  He has not had any episodes of vomiting.  His blurred vision seems to be persistent.  He is brought to the emergency room with a soft cervical collar and continues to have significant neck pain.  He is not aware of having injured any other joints during his fall.        No past medical history on file.  There are no problems to display for this patient.   This chart is marked for merging.  Please see the alternative patient chart for full medical history.     No family history on file.  Social History   Tobacco Use  . Smoking status: Not on file  Substance Use Topics  . Alcohol use: Not on file  . Drug use: Not on file    Home Medications Prior to Admission medications   Not on File    Allergies    Patient has no allergy information on record.  Review of Systems   Review of Systems   Constitutional: Negative for chills, fatigue and fever.  HENT: Negative for congestion, rhinorrhea and sore throat.   Eyes: Positive for visual disturbance.  Respiratory: Negative for chest tightness, shortness of breath and wheezing.   Cardiovascular: Negative for chest pain, palpitations and leg swelling.  Gastrointestinal: Negative for abdominal distention, diarrhea, nausea and vomiting.  Skin: Positive for wound. Negative for rash.  Neurological: Positive for numbness (chronic) and headaches. Negative for tremors, seizures, speech difficulty and weakness.  Psychiatric/Behavioral: Negative for confusion.    Physical Exam Updated Vital Signs BP (!) 170/85   Pulse (!) 57   Temp 98 F (36.7 C) (Oral)   Resp 14   Ht 6\' 1"  (1.854 m)   Wt 103 kg   SpO2 100%   BMI 29.95 kg/m   Physical Exam Constitutional:      General: He is not in acute distress.    Appearance: Normal appearance. He is not ill-appearing.     Comments: Lying supine in bed with a soft cervical collar.  No acute distress.  HENT:     Head: Normocephalic.     Right Ear: Tympanic membrane normal. There is no impacted cerumen.     Left Ear: Tympanic membrane normal. There is no impacted cerumen.  Nose: Nose normal.     Mouth/Throat:     Mouth: Mucous membranes are moist.     Pharynx: Oropharynx is clear. No oropharyngeal exudate or posterior oropharyngeal erythema.  Eyes:     General: Scleral icterus present.        Right eye: No discharge.        Left eye: No discharge.     Extraocular Movements: Extraocular movements intact.     Conjunctiva/sclera: Conjunctivae normal.     Pupils: Pupils are equal, round, and reactive to light.  Neck:     Comments: Soft cervical collar in place.  Significant neck discomfort with movement of his lower extremities, upper extremities.  Tenderness to palpation of the cervical spine. Cardiovascular:     Rate and Rhythm: Normal rate and regular rhythm.     Pulses: Normal  pulses.     Heart sounds: Murmur heard.   Pulmonary:     Effort: Pulmonary effort is normal. No respiratory distress.     Breath sounds: Normal breath sounds. No wheezing or rales.  Abdominal:     General: Bowel sounds are normal. There is no distension.     Palpations: Abdomen is soft.     Tenderness: There is no abdominal tenderness. There is no guarding.  Skin:    General: Skin is warm and dry.     Capillary Refill: Capillary refill takes less than 2 seconds.  Neurological:     General: No focal deficit present.     Mental Status: He is alert. Mental status is at baseline.     Cranial Nerves: No cranial nerve deficit.     Motor: No weakness.     Gait: Gait normal.  Psychiatric:        Mood and Affect: Mood normal.     ED Results / Procedures / Treatments   Labs (all labs ordered are listed, but only abnormal results are displayed) Labs Reviewed  BASIC METABOLIC PANEL  CBC WITH DIFFERENTIAL/PLATELET    EKG None  Radiology No results found.  Procedures .Marland KitchenLaceration Repair  Date/Time: 03/28/2020 9:25 PM Performed by: Matilde Haymaker, MD Authorized by: Lacretia Leigh, MD   Consent:    Consent obtained:  Verbal   Consent given by:  Patient   Alternatives discussed:  No treatment Anesthesia (see MAR for exact dosages):    Anesthesia method:  Local infiltration   Local anesthetic:  Lidocaine 1% WITH epi Laceration details:    Location:  Scalp   Length (cm):  8   Depth (mm):  5 Pre-procedure details:    Preparation:  Patient was prepped and draped in usual sterile fashion Exploration:    Hemostasis achieved with:  Epinephrine and direct pressure   Wound exploration: wound explored through full range of motion     Contaminated: no   Treatment:    Area cleansed with:  Saline   Amount of cleaning:  Standard Skin repair:    Repair method:  Staples   Number of staples:  5 Post-procedure details:    Dressing:  Antibiotic ointment and non-adherent dressing    Patient tolerance of procedure:  Tolerated well, no immediate complications   (including critical care time)  Medications Ordered in ED Medications - No data to display  ED Course  I have reviewed the triage vital signs and the nursing notes.  Pertinent labs & imaging results that were available during my care of the patient were reviewed by me and considered in my medical decision making (see chart  for details).    MDM Rules/Calculators/A&P                          [name] is a 69 year old man who presents to the ED after a fall in the shower today.  He has a previous medical history significant for aortic stenosis s/p TAVR procedure and A. fib on anticoagulation with apixaban.  History and physical exam are concerning for his occipital injury.  For now, we will move forward with noncontrast CT of the head neck to assess for stress fractures and cranial fractures.  Of course, we will also rule out intracranial hemorrhage.  Physical exam with palpation of the joints of the upper and lower extremities did not reveal any new tenderness or deformities.  No additional imaging needed at this time.  We will also follow-up an EKG assessing for possible syncope related to his loss of consciousness.  CT head and neck showed no evidence of intracranial hemorrhage.  Cervical spine is intact without evidence of new fractures or pathology.  He does have some significant cervical arthritis.  Cervical collar was removed.  The scalp laceration over his occiput was repaired with 5 staples.  He tolerated the procedure well and was encouraged to follow-up either in the ED or with his PCP within 1-2 weeks for staple removal.   Final Clinical Impression(s) / ED Diagnoses Final diagnoses:  None    Rx / DC Orders ED Discharge Orders    None       Matilde Haymaker, MD 03/28/20 2128    Lacretia Leigh, MD 04/01/20 2318

## 2020-03-28 NOTE — ED Provider Notes (Signed)
I saw and evaluated the patient, reviewed the resident's note and I agree with the findings and plan.  EKG: EKG Interpretation  Date/Time:  Thursday March 28 2020 16:38:10 EDT Ventricular Rate:  60 PR Interval:    QRS Duration: 94 QT Interval:  431 QTC Calculation: 431 R Axis:   47 Text Interpretation: Sinus rhythm ST elevation, consider anterior injury No old tracing to compare Confirmed by Lacretia Leigh [phone]) on 03/28/2020 4:29:10 PM   69 year old male who is here for mechanical fall just prior to arrival.  Duke Salvia the back of his head.  Is on blood thinners.  Has no focal neurological deficits.  Will check imaging as well as blood work.    Lacretia Leigh, MD 03/28/20 [phone]

## 2020-03-28 NOTE — ED Triage Notes (Addendum)
Pt BIB EMS for fall on thinners - Eliquis. Pt fell backward out of bathtub and hit head on base of toilet. Pt reports being knocked unconscious for 1 minute. Pt has laceration on back of head around 4 in. Pt reports a lot of pain, headache, and blurred vision. Pt has a history of afib and neuropathy in feet. 12 lead unremarkable in EMS, NSR.  EM VS Bp 183/88, Pulse 60, o2 100, sugar 131.

## 2020-04-02 DIAGNOSIS — R4182 Altered mental status, unspecified: Secondary | ICD-10-CM | POA: Diagnosis not present

## 2020-04-02 DIAGNOSIS — E785 Hyperlipidemia, unspecified: Secondary | ICD-10-CM | POA: Diagnosis not present

## 2020-04-02 DIAGNOSIS — I2510 Atherosclerotic heart disease of native coronary artery without angina pectoris: Secondary | ICD-10-CM | POA: Diagnosis not present

## 2020-04-02 DIAGNOSIS — R69 Illness, unspecified: Secondary | ICD-10-CM | POA: Diagnosis not present

## 2020-04-11 DIAGNOSIS — E785 Hyperlipidemia, unspecified: Secondary | ICD-10-CM | POA: Diagnosis not present

## 2020-04-11 DIAGNOSIS — J441 Chronic obstructive pulmonary disease with (acute) exacerbation: Secondary | ICD-10-CM | POA: Diagnosis not present

## 2020-04-11 DIAGNOSIS — R69 Illness, unspecified: Secondary | ICD-10-CM | POA: Diagnosis not present

## 2020-04-11 DIAGNOSIS — R4182 Altered mental status, unspecified: Secondary | ICD-10-CM | POA: Diagnosis not present

## 2020-04-12 ENCOUNTER — Telehealth: Payer: Self-pay | Admitting: Cardiology

## 2020-04-12 NOTE — Telephone Encounter (Signed)
Returned call to pt notified that Dr Ellyn Hack is seeing pt's. Pt states that he is thinking about [place] mobile. All questions answered. Pt is asking about procardia and to notify that he fell about 2 weeks ago. Please advise.

## 2020-04-12 NOTE — Telephone Encounter (Signed)
Does he mean cardia the monitor or Procardia the medicine?  dh

## 2020-04-12 NOTE — Telephone Encounter (Signed)
Returned call to wife she states that this is just about the cardia monitor NOT the medication.

## 2020-04-12 NOTE — Telephone Encounter (Signed)
Patient's wife, Olin Hauser, is inquiring about whether patient is eligible for Procardia. She states this was discussed with Dr. Ellyn Hack in the past. However, per Olin Hauser, Dr. Ellyn Hack advised that the patient wait 6 months post valve replacement. Please return call to further discuss.

## 2020-04-14 NOTE — Telephone Encounter (Signed)
Jodelle Red mobile would be a reasonable option to try to assess what his rhythm is during these random events - probably more luck catching something than a short term monitor.  Usually episodes can be stored & even transmitted via MyChart for [place] to review.   [name]

## 2020-04-15 NOTE — Telephone Encounter (Signed)
Spoke to patient. He is aware that he can use [employer]ardia mobile if he likes. Patient states he already has the device. Patient already has an appointment with APP. 04/19/20.

## 2020-04-18 DIAGNOSIS — M19012 Primary osteoarthritis, left shoulder: Secondary | ICD-10-CM | POA: Diagnosis not present
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
## 2020-04-19 ENCOUNTER — Telehealth: Payer: Medicare HMO | Admitting: Physician Assistant

## 2020-04-19 ENCOUNTER — Other Ambulatory Visit: Payer: Self-pay

## 2020-04-19 DIAGNOSIS — Z952 Presence of prosthetic heart valve: Secondary | ICD-10-CM

## 2020-04-19 DIAGNOSIS — I480 Paroxysmal atrial fibrillation: Secondary | ICD-10-CM

## 2020-04-19 DIAGNOSIS — I5032 Chronic diastolic (congestive) heart failure: Secondary | ICD-10-CM

## 2020-04-19 DIAGNOSIS — I350 Nonrheumatic aortic (valve) stenosis: Secondary | ICD-10-CM

## 2020-04-19 DIAGNOSIS — I10 Essential (primary) hypertension: Secondary | ICD-10-CM
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
## 2020-04-22 ENCOUNTER — Telehealth (INDEPENDENT_AMBULATORY_CARE_PROVIDER_SITE_OTHER): Payer: Medicare HMO | Admitting: Physician Assistant

## 2020-04-22 ENCOUNTER — Encounter: Payer: Self-pay | Admitting: Physician Assistant

## 2020-04-22 ENCOUNTER — Other Ambulatory Visit: Payer: Self-pay

## 2020-04-22 VITALS — BP 157/93 | HR 63 | Ht 73.0 in | Wt 227.0 lb

## 2020-04-22 DIAGNOSIS — W19XXXD Unspecified fall, subsequent encounter: Secondary | ICD-10-CM

## 2020-04-22 DIAGNOSIS — I5032 Chronic diastolic (congestive) heart failure: Secondary | ICD-10-CM | POA: Diagnosis not present

## 2020-04-22 DIAGNOSIS — Z952 Presence of prosthetic heart valve: Secondary | ICD-10-CM | POA: Diagnosis not present

## 2020-04-22 DIAGNOSIS — Y92009 Unspecified place in unspecified non-institutional (private) residence as the place of occurrence of the external cause: Secondary | ICD-10-CM | POA: Diagnosis not present

## 2020-04-22 DIAGNOSIS — I480 Paroxysmal atrial fibrillation: Secondary | ICD-10-CM

## 2020-04-22 DIAGNOSIS — Z7901 Long term (current) use of anticoagulants: Secondary | ICD-10-CM

## 2020-04-22 DIAGNOSIS — I350 Nonrheumatic aortic (valve) stenosis: Secondary | ICD-10-CM | POA: Diagnosis not present

## 2020-04-22 DIAGNOSIS — I10 Essential (primary) hypertension: Secondary | ICD-10-CM | POA: Diagnosis not present

## 2020-04-22 NOTE — Patient Instructions (Signed)
Medication Instructions:  Your physician recommends that you continue on your current medications as directed. Please refer to the Current Medication list given to you today.  *If you need a refill on your cardiac medications before your next appointment, please call your pharmacy*  Lab Work: None ordered today  Testing/Procedures: None ordered today  Follow-Up: On 07/25/2020 at 2:00PM with [name], MD  Other Instructions Check BP 1-2 times a day for 2 weeks and send all readings to Dr. Ellyn Hack

## 2020-04-29 ENCOUNTER — Other Ambulatory Visit: Payer: Self-pay

## 2020-04-29 ENCOUNTER — Ambulatory Visit (INDEPENDENT_AMBULATORY_CARE_PROVIDER_SITE_OTHER): Payer: Medicare HMO

## 2020-04-29 DIAGNOSIS — Z23 Encounter for immunization: Secondary | ICD-10-CM | POA: Diagnosis not present

## 2020-04-29 DIAGNOSIS — M25562 Pain in left knee: Secondary | ICD-10-CM | POA: Diagnosis not present

## 2020-04-29 DIAGNOSIS — Z79891 Long term (current) use of opiate analgesic: Secondary | ICD-10-CM | POA: Diagnosis not present

## 2020-04-29 DIAGNOSIS — G894 Chronic pain syndrome: Secondary | ICD-10-CM | POA: Diagnosis not present

## 2020-04-29 DIAGNOSIS — M5136 Other intervertebral disc degeneration, lumbar region: Secondary | ICD-10-CM | POA: Diagnosis not present

## 2020-05-03 ENCOUNTER — Telehealth: Payer: Self-pay | Admitting: General Practice

## 2020-05-03 NOTE — Telephone Encounter (Signed)
Patient stated she was returning a call. I did not see any notes. She looked at her vm and stated it was a flu shot reminder.

## 2020-05-16 DIAGNOSIS — R4182 Altered mental status, unspecified: Secondary | ICD-10-CM | POA: Diagnosis not present

## 2020-05-16 DIAGNOSIS — E785 Hyperlipidemia, unspecified: Secondary | ICD-10-CM | POA: Diagnosis not present

## 2020-05-16 DIAGNOSIS — M75102 Unspecified rotator cuff tear or rupture of left shoulder, not specified as traumatic: Secondary | ICD-10-CM | POA: Diagnosis not present

## 2020-05-16 DIAGNOSIS — R69 Illness, unspecified: Secondary | ICD-10-CM | POA: Diagnosis not present

## 2020-05-29 DIAGNOSIS — M25562 Pain in left knee: Secondary | ICD-10-CM | POA: Diagnosis not present

## 2020-05-29 DIAGNOSIS — G894 Chronic pain syndrome: Secondary | ICD-10-CM | POA: Diagnosis not present

## 2020-05-29 DIAGNOSIS — M5136 Other intervertebral disc degeneration, lumbar region: Secondary | ICD-10-CM | POA: Diagnosis not present

## 2020-05-29 DIAGNOSIS — Z79891 Long term (current) use of opiate analgesic: Secondary | ICD-10-CM | POA: Diagnosis not present

## 2020-07-22 ENCOUNTER — Other Ambulatory Visit (HOSPITAL_COMMUNITY): Payer: Medicare HMO

## 2020-07-25 ENCOUNTER — Ambulatory Visit: Payer: Self-pay | Admitting: Cardiology

## 2020-08-09 ENCOUNTER — Other Ambulatory Visit: Payer: Self-pay | Admitting: Anesthesiology

## 2020-08-09 ENCOUNTER — Ambulatory Visit
Admission: RE | Admit: 2020-08-09 | Discharge: 2020-08-09 | Disposition: A | Discharge status: Home / Self Care | Payer: Medicare HMO | Source: Ambulatory Visit | Attending: Anesthesiology | Admitting: Anesthesiology

## 2020-08-09 DIAGNOSIS — M542 Cervicalgia: Secondary | ICD-10-CM

## 2020-08-13 ENCOUNTER — Other Ambulatory Visit (HOSPITAL_COMMUNITY): Payer: Self-pay

## 2020-08-16 ENCOUNTER — Ambulatory Visit: Payer: Self-pay | Admitting: General Practice

## 2020-08-23 ENCOUNTER — Other Ambulatory Visit: Payer: Self-pay | Admitting: Physician Assistant
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
## 2020-09-04 ENCOUNTER — Ambulatory Visit (HOSPITAL_COMMUNITY): Payer: Medicare HMO | Attending: Cardiology

## 2020-09-04 ENCOUNTER — Encounter: Payer: Self-pay | Admitting: Physician Assistant

## 2020-09-04 ENCOUNTER — Other Ambulatory Visit: Payer: Self-pay

## 2020-09-04 ENCOUNTER — Ambulatory Visit: Payer: Medicare HMO | Admitting: Physician Assistant

## 2020-09-04 VITALS — BP 126/70 | HR 72 | Ht 74.0 in | Wt 240.8 lb

## 2020-09-04 DIAGNOSIS — Z952 Presence of prosthetic heart valve: Secondary | ICD-10-CM

## 2020-09-04 DIAGNOSIS — I10 Essential (primary) hypertension: Secondary | ICD-10-CM | POA: Diagnosis not present

## 2020-09-04 DIAGNOSIS — R918 Other nonspecific abnormal finding of lung field: Secondary | ICD-10-CM

## 2020-09-04 DIAGNOSIS — I5032 Chronic diastolic (congestive) heart failure: Secondary | ICD-10-CM

## 2020-09-04 DIAGNOSIS — I480 Paroxysmal atrial fibrillation: Secondary | ICD-10-CM | POA: Diagnosis not present

## 2020-09-04 HISTORY — PX: TRANSTHORACIC ECHOCARDIOGRAM: SHX275

## 2020-09-04 LAB — ECHOCARDIOGRAM COMPLETE
AR max vel: 2.46 cm2
AV Area VTI: 2.94 cm2
AV Area mean vel: 2.73 cm2
AV Mean grad: 11 mmHg
AV Peak grad: 23.7 mmHg
Ao pk vel: 2.44 m/s
Area-P 1/2: 1.42 cm2
Height: 74 in
MV VTI: 2.66 cm2
S' Lateral: 2.15 cm
Weight: 3852.8 oz

## 2020-09-04 NOTE — Patient Instructions (Addendum)
Medication Instructions:  Your physician recommends that you continue on your current medications as directed. Please refer to the Current Medication list given to you today.  *If you need a refill on your cardiac medications before your next appointment, please call your pharmacy*  Lab Work: If you have labs (blood work) drawn today and your tests are completely normal, you will receive your results only by: [name] MyChart Message (if you have MyChart) OR . A paper copy in the mail If you have any lab test that is abnormal or we need to change your treatment, we will call you to review the results.  Testing/Procedures: Non-Cardiac CT scanning, (CAT scanning), is a noninvasive, special x-ray that produces cross-sectional images of the body using x-rays and a computer. CT scans help physicians diagnose and treat medical conditions. For some CT exams, a contrast material is used to enhance visibility in the area of the body being studied. CT scans provide greater clarity and reveal more details than regular x-ray exams.  Follow-Up: At [facility], you and your health needs are our priority.  As part of our continuing mission to provide you with exceptional heart care, we have created designated Provider Care Teams.  These Care Teams include your primary Cardiologist (physician) and Advanced Practice Providers (APPs -  Physician Assistants and Nurse Practitioners) who all work together to provide you with the care you need, when you need it.  We recommend signing up for the patient portal called "MyChart".  Sign up information is provided on this After Visit Summary.  MyChart is used to connect with patients for Virtual Visits (Telemedicine).  Patients are able to view lab/test results, encounter notes, upcoming appointments, etc.  Non-urgent messages can be sent to your provider as well.   To learn more about what you can do with MyChart, go to [email or web address].    Your next appointment:    Keep follow up appointment that is already scheduled with Dr. Ellyn Hack in June.

## 2020-09-10 ENCOUNTER — Ambulatory Visit: Payer: Self-pay | Admitting: General Practice

## 2020-09-20 ENCOUNTER — Other Ambulatory Visit: Payer: Self-pay

## 2020-09-20 ENCOUNTER — Ambulatory Visit (INDEPENDENT_AMBULATORY_CARE_PROVIDER_SITE_OTHER)
Admission: RE | Admit: 2020-09-20 | Discharge: 2020-09-20 | Disposition: A | Discharge status: Home / Self Care | Payer: Medicare HMO | Source: Ambulatory Visit | Attending: Physician Assistant | Admitting: Physician Assistant

## 2020-09-20 DIAGNOSIS — R918 Other nonspecific abnormal finding of lung field: Secondary | ICD-10-CM

## 2020-11-25 ENCOUNTER — Ambulatory Visit: Payer: Medicare HMO | Admitting: Cardiology

## 2020-11-29 ENCOUNTER — Ambulatory Visit: Payer: Medicare HMO | Admitting: Cardiology

## 2021-01-06 ENCOUNTER — Ambulatory Visit: Payer: Medicare HMO | Admitting: Internal Medicine

## 2021-01-29 NOTE — Progress Notes (Signed)
HPI M Veteran, Never smoker followed for Bronchiectasis/recurrent pneumonia/Enterobacter due to reflux, complicated by DM, HBP, lung nodule, GERD, ischemic colitis/ multiple surgeries, valvular heart disease Office Spirometry 01/16/16-moderate restriction of exhaled volume, at least mild obstructive airways disease. FVC 3.02/59%, FEV1 2.13/56%, ratio 0.95, FEF 25-75% 1.28/42%  --------------------------------------------------------   02/09/2019- 70 year old male [name], never smoker, followed for Bronchiectasis/ recurrent pneumonia/Enterobacter/recurrent aspiration, complicated by DM, HBP, lung nodule, GERD, ischemic colitis/multiple surgeries, valvular heart disease, ASCVD, AFib -----reports nose bleed this morning at 7:30, states his PCP has him back on sulfa antibiotic ( on Eliquis) Saw Dr Redmond Baseman ENT who recommended conservative management of chronic sinusiits with saline rinses. Albuterol neb, albuterol hfa, Symbicort 160, Flutter,  No fever, non-purulent chronic cough.  Reports syncopal episode, found down by wife last week. PCP has evaluated. Being considered for aortic valve repair with repeat Echo pending 5 months.  R nostril bleed self-limited with compression.  Submitted repeat sputum Cx- pending.   01/30/21- 70 year old male [name], never smoker, followed for Bronchiectasis/ recurrent pneumonia/Enterobacter/recurrent aspiration, complicated by DM, HBP, lung nodule, GERD, ischemic colitis/multiple surgeries, valvular heart disease/ TAVR, ASCVD, AFib, Depression,  --Albuterol neb, albuterol hfa, Wixela 250 Flutter, Covid- 3 Moderna TAVR surgery did help strength and dyspnea. Considers breathing ok now, w/o significant wheeze or cough. Occasional use of rescue inhaler.  Pending R shoulder replacement.- Offers no concerns that would delay this. I don't suggest systemic steroids now unless he gets worse. CT chest 09/20/20 IMPRESSION: 1. Stable right-sided calcified and noncalcified  pulmonary nodules the. No new or enlarging suspicious pulmonary nodules. 2. Mild distal esophageal wall thickening, which may reflect esophagitis. 3. Surgical changes of TAVR. 4. Aortic atherosclerosis. Aortic Atherosclerosis (ICD10-I70.0).  ROS-   += positive Constitutional:   No-   weight loss, night sweats, +fevers, chills, fatigue, lassitude. HEENT:   No-  headaches, difficulty swallowing, +tooth/dental problems, sore throat,        sneezing, itching, ear ache, nasal congestion, post nasal drip,  CV:      chest pain, +orthopnea, PND, swelling in lower extremities, anasarca, dizziness, +palpitations Resp: + shortness of breath with exertion or at rest.                +productive cough,  No non-productive cough,              +change in color of mucus.  No- wheezing.   Skin: No-   rash or lesions. GI:  No-   heartburn, indigestion, +abdominal pain, nausea, vomiting,  GU:  MS:  +  joint pain or swelling.  Neuro-     nothing unusual Psych:  No- change in mood or affect. + depression or anxiety.  No memory loss  OBJ- Physical Exam    General- Alert, Oriented, + anxious/depressed, Distress- none acute, + overweight Skin- + fungal nail changes right hand and also reported on toes of both feet Lymphadenopathy- none Head- atraumatic            Eyes- Gross vision intact, PERRLA, conjunctivae and secretions clear            Ears- Hearing, canals-normal            Nose-  no-Septal dev,polyps, erosion, perforation             Throat- Mallampati II , mucosa clear , drainage- none, tonsils- atrophic. + Missing teeth,  Neck- flexible , trachea midline, no stridor , thyroid nl, carotid no bruit Chest - symmetrical excursion , unlabored  Heart/CV- RRR ,  murmur + 2/6 S aortic , no gallop  , no rub, nl s1 s2                           - JVD- none , edema+1/ support hose, stasis changes- none, varices- none           Lung-+ bilateral wheeze, unlabored, cough-none                   dullness-none, rub- none           Chest wall-  Abd-  Br/ Gen/ Rectal- Not done, not indicated Extrem-  +cane,  Neuro- +mild word-searching

## 2021-01-30 ENCOUNTER — Other Ambulatory Visit: Payer: Self-pay

## 2021-01-30 ENCOUNTER — Ambulatory Visit: Payer: Medicare HMO | Admitting: Internal Medicine

## 2021-01-30 ENCOUNTER — Encounter: Payer: Self-pay | Admitting: Internal Medicine

## 2021-01-30 DIAGNOSIS — J449 Chronic obstructive pulmonary disease, unspecified: Secondary | ICD-10-CM

## 2021-01-30 DIAGNOSIS — I350 Nonrheumatic aortic (valve) stenosis: Secondary | ICD-10-CM

## 2021-01-30 MED ORDER — ALBUTEROL SULFATE 108 (90 BASE) MCG/ACT IN AEPB: 1.0000 | INHALATION_SPRAY | RESPIRATORY_TRACT | 4 refills | Status: DC | PRN

## 2021-01-30 NOTE — Patient Instructions (Addendum)
Ok to continue [employer] for [employer] rescue inhaler  Please call we can help

## 2021-02-04 ENCOUNTER — Encounter: Payer: Self-pay | Admitting: Internal Medicine

## 2021-02-04 NOTE — Assessment & Plan Note (Signed)
Moderate persistent asthmatic bronchitis without acute exacerbation or evident current infection Plan- discussed use of current meds including nebulizer. Refill rescue inhaler.

## 2021-02-04 NOTE — Assessment & Plan Note (Signed)
Improved after TAVR and followed by cardiology.

## 2021-04-18 ENCOUNTER — Ambulatory Visit: Payer: Medicare HMO | Admitting: Cardiology

## 2021-05-07 ENCOUNTER — Other Ambulatory Visit (HOSPITAL_BASED_OUTPATIENT_CLINIC_OR_DEPARTMENT_OTHER): Payer: Self-pay

## 2021-05-07 MED ORDER — FLUAD QUADRIVALENT 0.5 ML IM PRSY
PREFILLED_SYRINGE | INTRAMUSCULAR | 0 refills | Status: DC
  Filled 2021-05-07: qty 0.5, 1d supply, fill #0

## 2021-06-13 ENCOUNTER — Telehealth: Payer: Self-pay | Admitting: Internal Medicine

## 2021-06-13 MED ORDER — AMOXICILLIN-POT CLAVULANATE 875-125 MG PO TABS: 1.0000 | ORAL_TABLET | Freq: Two times a day (BID) | ORAL | 0 refills | Status: DC

## 2021-06-13 NOTE — Telephone Encounter (Signed)
Augmentin 875 mg, # 14, 1 twice daily

## 2021-06-13 NOTE — Telephone Encounter (Signed)
Spoke with the pt and notified of response per Dr Annamaria Boots  She verbalized understanding  Nothing further needed

## 2021-06-13 NOTE — Telephone Encounter (Signed)
Spoke with the pt  He c/o cough with beige/yellow sputum x 2-3 wks  He has had fever off and on past 2 wks  He states also wheezing some  He is using albuterol neb bid and symbicort bid and mucinex bid  He is fully vaccinated and he had his flu shot  Has not taken covid test bc he says he never goes out anywhere at all  He states this feels like his typical bronchitis Please advise thanks!  Allergies  Allergen Reactions   Bee Venom Anaphylaxis   Bystolic [Nebivolol Hcl] Other (See Comments)    Nightmares, flashbacks (PTSD)   Codeine Shortness Of Breath and Rash   Doxycycline Shortness Of Breath and Rash   Iohexol Hives, Shortness Of Breath and Rash    Neck and Torso erythemia IVP-DYE >> SHORTNESS OF BREATH    Ivp Dye [Iodinated Contrast Media] Shortness Of Breath    Lost consciousness/difficulty breathing Omni - Paque Contrast    Levaquin [Levofloxacin] Hives and Shortness Of Breath   Oxycodone Hcl Shortness Of Breath, Swelling and Rash   Stadol [Butorphanol] Shortness Of Breath   Succinylcholine Chloride Shortness Of Breath, Nausea Only, Rash and Other (See Comments)    Difficulty breathing    Tamiflu [Oseltamivir Phosphate]     UNSPECIFIED REACTION    Atorvastatin     myalgias   Synvisc [Hylan G-F 20] Hives and Rash   Dilaudid [Hydromorphone Hcl] Nausea Only and Rash    Aggressive    Methocarbamol Nausea Only and Rash   Pentazocine Lactate Rash    rash

## 2021-06-23 MED ORDER — PREDNISONE 10 MG PO TABS: ORAL_TABLET | ORAL | 0 refills | Status: DC

## 2021-06-23 NOTE — Telephone Encounter (Signed)
Called and spoke with patient. He stated that he has completed the amoxicillin that he was prescribed on 06/13/21. The cough has calmed down some but it is still productive with a beige color. Still wheezing.  Denies any fevers, body aches or being around anyone who has been sick recently. He has been doing twice daily albuterol neb treatments and using his Symbicort twice daily. He still has not taken a covid test.   He stated that he had this same illness this time last year and he was prescribed prednisone. He wanted to know if CY would willing to prescribe the prednisone again.   Pharmacy is [facility].   CY, can you please advise? Thanks!

## 2021-06-23 NOTE — Telephone Encounter (Signed)
Called and spoke with patient. He verbalized understanding of CY's recommendations. RX will be sent to [facility].   Nothing further needed at time of call.

## 2021-06-23 NOTE — Telephone Encounter (Signed)
Prednisone 10 mg, # 20, 4 X 2 DAYS, 3 X 2 DAYS, 2 X 2 DAYS, 1 X 2 DAYS If he doesn't feel better soon, please let us know.

## 2021-06-23 NOTE — Addendum Note (Signed)
Addended by: Valerie Salts on: 06/23/2021 10:31 AM   Modules accepted: Orders

## 2021-07-07 MED ORDER — AZITHROMYCIN 250 MG PO TABS: ORAL_TABLET | ORAL | 0 refills | Status: DC

## 2021-07-07 NOTE — Addendum Note (Signed)
Addended by: Valerie Salts on: 07/07/2021 11:22 AM   Modules accepted: Orders

## 2021-07-07 NOTE — Telephone Encounter (Signed)
I called and spoke with the pt and notified of response per Dr Annamaria Boots  Rx sent to pharm  Nothing further needed

## 2021-07-07 NOTE — Addendum Note (Signed)
Addended by: Rosana Berger on: 07/07/2021 04:25 PM   Modules accepted: Orders

## 2021-07-07 NOTE — Telephone Encounter (Signed)
Called and spoke with patient. He stated that he has finished the prednisone that CY has sent in for him. He woke up yesterday with a slight fever (99.34F) and productive cough with yellow phlegm. Slight increase in SOB. He used 2L of O2 this morning to help with the SOB. He also has been using his albuterol nebs, Mucinex and Symbicort without any relief.   He did mention that he has a sore throat. Denies being around anyone with COVID over the past few days. I advised him that since he has a sore throat, he may be asked to take a covid test just to be on safe side.   Pharmacy is [facility].   CY, can you please advise? Thanks!

## 2021-07-07 NOTE — Telephone Encounter (Signed)
Offer Zpak    250 mg, # 6, 2 today then one daily 

## 2021-09-24 NOTE — Progress Notes (Signed)
HPI ?[name], [name] followed for Bronchiectasis/recurrent pneumonia/Enterobacter due to reflux, complicated by DM, HBP, lung nodule, GERD, ischemic colitis/ multiple surgeries, valvular heart disease ?Office Spirometry 01/16/16-moderate restriction of exhaled volume, at least mild obstructive airways disease. FVC 3.02/59%, FEV1 2.13/56%, ratio 0.95, FEF 25-75% 1.28/42% ? ?-------------------------------------------------------- ? ? ?01/30/21- 71 year old male [name], [name], followed for Bronchiectasis/ recurrent pneumonia/Enterobacter/recurrent aspiration, complicated by DM, HBP, lung nodule, GERD, ischemic colitis/multiple surgeries, valvular heart disease/ TAVR, ASCVD, AFib, Depression,  ?--Albuterol neb, albuterol hfa, Wixela 250 Flutter, ?Covid- 3 Moderna ?TAVR surgery did help strength and dyspnea. Considers breathing ok now, w/o significant wheeze or cough. Occasional use of rescue inhaler.  ?Pending R shoulder replacement.- Offers no concerns that would delay this. I don't suggest systemic steroids now unless he gets worse. ?CT chest 09/20/20 ?IMPRESSION: ?1. Stable right-sided calcified and noncalcified pulmonary nodules ?the. No new or enlarging suspicious pulmonary nodules. ?2. Mild distal esophageal wall thickening, which may reflect ?esophagitis. ?3. Surgical changes of TAVR. ?4. Aortic atherosclerosis. ?Aortic Atherosclerosis (ICD10-I70.0). ? ?4/27/William- 71 year old male [name], [name], followed for Bronchiectasis/ recurrent pneumonia/Enterobacter/recurrent aspiration, complicated by DM, HTN, lung nodule, GERD, ischemic colitis/multiple surgeries, valvular heart disease/ TAVR, ASCVD, AFib, Depression,  ?--Albuterol neb, albuterol hfa, Symbicort 160,  Flutter, ?Covid- 3 Moderna ?Flu vax- ?We sent Zpak and prednisone in Feb. ?-----Acute. Patient having shortness of breath.    Arrival BP 170/104, HR 70, O2 sat 99% RA ?He had fallen and bathroom injuring shoulder and pending surgery when  able.  Has oral morphine from orthopedist. ?[employer] oxygen at 2 L with portable.  He watches his oximeter and uses O2 as needed. ?He is a little stiff and still sore it shows me bruising on left lateral rib cage. ?CXR 4/27/William-  ?IMPRESSION: ?No radiographic evidence of thoracic trauma. ? ?ROS-   += positive ?Constitutional:   No-   weight loss, night sweats, +fevers, chills, fatigue, lassitude. ?HEENT:   No-  headaches, difficulty swallowing, +tooth/dental problems, sore throat,  ?      sneezing, itching, ear ache, nasal congestion, post nasal drip,  ?CV:      chest pain, +orthopnea, PND, swelling in lower extremities, anasarca, dizziness, +palpitations ?Resp: + shortness of breath with exertion or at rest.   ?             +productive cough,  No non-productive cough,   ?           +change in color of mucus.  No- wheezing.   ?Skin: No-   rash or lesions. ?GI:  No-   heartburn, indigestion, +abdominal pain, nausea, vomiting,  ?GU:  ?MS:  +  joint pain or swelling.  ?Neuro-     nothing unusual ?Psych:  No- change in mood or affect. + depression or anxiety.  No memory loss ? ?OBJ- Physical Exam    ?General- Alert, Oriented, + anxious/depressed, Distress- none acute, + overweight ?Skin- + fungal nail changes right hand and also reported on toes of both feet ?Lymphadenopathy- none ?Head- atraumatic ?           Eyes- Gross vision intact, PERRLA, conjunctivae and secretions clear ?           Ears- Hearing, canals-normal ?           Nose-  no-Septal dev,polyps, erosion, perforation  ?           Throat- Mallampati II , mucosa clear , drainage- none, tonsils- atrophic. + Missing teeth,  ?Neck- flexible , trachea  midline, no stridor , thyroid nl, carotid no bruit ?Chest - symmetrical excursion , unlabored ?          Heart/CV- RRR ,  murmur + 2/6 S aortic , no gallop  , no rub, nl s1 s2 ?                          - JVD- none , edema+1/ support hose, stasis changes- none, varices- none ?          Lung-+clear unlabored,  cough-none, +talkative on room air ?                 dullness-none, rub- none ?          Chest wall- + bruising left rib cage ?Abd- + multiple surgical scars ?Br/ Gen/ Rectal- Not done, not indicated ?Extrem-  +cane,  ?Neuro- +mild word-searching ? ? ?

## 2021-09-25 ENCOUNTER — Encounter: Payer: Self-pay | Admitting: Internal Medicine

## 2021-09-25 ENCOUNTER — Telehealth: Payer: Self-pay | Admitting: Internal Medicine

## 2021-09-25 ENCOUNTER — Ambulatory Visit: Payer: Medicare HMO

## 2021-09-25 ENCOUNTER — Ambulatory Visit: Payer: Medicare HMO | Admitting: Internal Medicine

## 2021-09-25 ENCOUNTER — Ambulatory Visit (INDEPENDENT_AMBULATORY_CARE_PROVIDER_SITE_OTHER): Payer: Medicare HMO

## 2021-09-25 DIAGNOSIS — R911 Solitary pulmonary nodule: Secondary | ICD-10-CM

## 2021-09-25 DIAGNOSIS — J449 Chronic obstructive pulmonary disease, unspecified: Secondary | ICD-10-CM

## 2021-09-25 DIAGNOSIS — W19XXXA Unspecified fall, initial encounter: Secondary | ICD-10-CM

## 2021-09-25 NOTE — Telephone Encounter (Signed)
Called and spoke with patient. He stated that he fell a few nights ago into his bookcase. He still has some bruising around the area that hit the bookcase. He has an appt this afternoon with Dr. Annamaria Boots and he wanted to see while he was here if he could get a CXR. I advised him that I would send a message to Dr. Annamaria Boots to let him know what was going on.  ? ?Will send to Dr. Annamaria Boots as a FYI.  ?

## 2021-09-25 NOTE — Patient Instructions (Addendum)
I think you can go ahead with shoulder surgery as planned ? ?You can splint your sore ribs with a pillow to cough ? ?Please be very careful  ? ?We can keep the September appointment unless you need to see [place] sooner ?

## 2021-09-25 NOTE — Telephone Encounter (Signed)
Ok to have him come early for his appointment to get a CXR for dx COPD mixed type and please ask where he hit when he fell. If he hit his ribs, please ask for "rib details" on that side for dx "Fall" ?

## 2021-09-25 NOTE — Telephone Encounter (Signed)
Called and spoke with patient. He stated that he fell on the left side. He is aware to get here about 10-29mns early for the CXR first.  ? ?Order has been placed.  ? ?Nothing further needed at time of call.  ?

## 2021-09-26 NOTE — Progress Notes (Signed)
Spoke with pt and voices understanding. Pt did not have any further questions.

## 2021-10-13 NOTE — Assessment & Plan Note (Signed)
Some old scarring but no obvious progression or acute concern. ?

## 2021-10-13 NOTE — Assessment & Plan Note (Addendum)
No exacerbation currently.  Recent fall does not seem to have affected his breathing.  Known bronchiectasis component but without obvious retained secretions or significant cough today. ?He bought his own oxygen and decides when to use it.  I discussed safety range. ?

## 2021-10-21 ENCOUNTER — Ambulatory Visit: Payer: Medicare HMO | Admitting: Cardiology

## 2021-11-05 ENCOUNTER — Telehealth: Payer: Self-pay | Admitting: Internal Medicine

## 2021-11-05 NOTE — Telephone Encounter (Signed)
Called patient and he advised [place] that he went to his PCP a few days ago. They did a swap of her throat and it came back positive for a bacteria and that his PCP placed him on an antibiotic to treat the medication. Patient was just calling to inform us of this new information. I advised patient to call us after he finished medication to see how he was doing.Nothing further needed

## 2021-11-13 ENCOUNTER — Ambulatory Visit: Payer: Medicare HMO | Admitting: Nurse Practitioner
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
## 2021-11-20 ENCOUNTER — Ambulatory Visit: Payer: Medicare HMO | Admitting: Nurse Practitioner

## 2021-12-11 ENCOUNTER — Telehealth: Payer: Self-pay

## 2021-12-11 NOTE — Telephone Encounter (Signed)
Called pt to set hi up with an appt with Dr. Ellyn Hack. Since he has not been seen in awhile he is classified as a new patient. Pt added to Dr. Ellyn Hack schedule. He requested his pt be set up with an appt too. She was last seen in 2021 by Dr. Ellyn Hack. An appt was made for her as well.

## 2021-12-11 NOTE — Telephone Encounter (Signed)
LMOM to r/s 7/14/223 apt with EM yesterday 09/10/21 @ 3:00 PM. Followed up with call today 12/11/21 @ 1:50 PM. Pt was made aware that his appointment scheduled for tomorrow 12/12/21 with Diona Browner DNP needed to be rescheduled due to Diona Browner, DNP having to cancel clinic. Pt was offered several appointments with APP in our office. Pt stated he hasn't seen Dr. Ellyn Hack in 3 years and he is the only person he wants to see. Pt was K. [place] Silos 2022. Pt is aware that there aren't any current openings with Dr. Ellyn Hack. Per pt he wants to see him only. Please advise.

## 2021-12-11 NOTE — Telephone Encounter (Signed)
Noted  

## 2021-12-12 ENCOUNTER — Ambulatory Visit: Payer: Medicare HMO | Admitting: Nurse Practitioner

## 2022-01-26 ENCOUNTER — Encounter: Payer: Self-pay | Admitting: Cardiology

## 2022-01-26 ENCOUNTER — Ambulatory Visit: Payer: Medicare HMO | Attending: Cardiology | Admitting: Cardiology

## 2022-01-26 VITALS — BP 154/81 | HR 67 | Ht 72.0 in | Wt 232.0 lb

## 2022-01-26 DIAGNOSIS — I110 Hypertensive heart disease with heart failure: Secondary | ICD-10-CM | POA: Diagnosis not present

## 2022-01-26 DIAGNOSIS — E1169 Type 2 diabetes mellitus with other specified complication: Secondary | ICD-10-CM

## 2022-01-26 DIAGNOSIS — G9009 Other idiopathic peripheral autonomic neuropathy: Secondary | ICD-10-CM | POA: Diagnosis not present

## 2022-01-26 DIAGNOSIS — E785 Hyperlipidemia, unspecified: Secondary | ICD-10-CM

## 2022-01-26 DIAGNOSIS — R600 Localized edema: Secondary | ICD-10-CM

## 2022-01-26 DIAGNOSIS — D692 Other nonthrombocytopenic purpura: Secondary | ICD-10-CM

## 2022-01-26 DIAGNOSIS — Z952 Presence of prosthetic heart valve: Secondary | ICD-10-CM

## 2022-01-26 DIAGNOSIS — I5032 Chronic diastolic (congestive) heart failure: Secondary | ICD-10-CM

## 2022-01-26 DIAGNOSIS — E118 Type 2 diabetes mellitus with unspecified complications: Secondary | ICD-10-CM
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
## 2022-01-26 NOTE — Patient Instructions (Signed)
Medication Instructions:   No changes  *If you need a refill on your cardiac medications before your next appointment, please call your pharmacy*   Lab Work:  Not needed    Testing/Procedures:  Will have [address] - April 2024 Your physician has requested that you have an echocardiogram. Echocardiography is a painless test that uses sound waves to create images of your heart. It provides your doctor with information about the size and shape of your heart and how well your heart's chambers and valves are working. This procedure takes approximately one hour. There are no restrictions for this procedure.   Follow-Up: At [facility], you and your health needs are our priority.  As part of our continuing mission to provide you with exceptional heart care, we have created designated Provider Care Teams.  These Care Teams include your primary Cardiologist (physician) and Advanced Practice Providers (APPs -  Physician Assistants and Nurse Practitioners) who all work together to provide you with the care you need, when you need it.  We recommend signing up for the patient portal called "MyChart".  Sign up information is provided on this After Visit Summary.  MyChart is used to connect with patients for Virtual Visits (Telemedicine).  Patients are able to view lab/test results, encounter notes, upcoming appointments, etc.  Non-urgent messages can be sent to your provider as well.   To learn more about what you can do with MyChart, go to [email or web address].    Your next appointment:   7 month(s) April  2024  The format for your next appointment:   In Person  Provider:   [name], MD    Other Instructions   Keep eye blood pressure  - monitor

## 2022-01-28 NOTE — Progress Notes (Deleted)
HPI M Veteran, Never smoker followed for Bronchiectasis/recurrent pneumonia/Enterobacter due to reflux, complicated by DM, HBP, lung nodule, GERD, ischemic colitis/ multiple surgeries, valvular heart disease Office Spirometry 01/16/16-moderate restriction of exhaled volume, at least mild obstructive airways disease. FVC 3.02/59%, FEV1 2.13/56%, ratio 0.95, FEF 25-75% 1.28/42%  --------------------------------------------------------    09/25/21- 71 year old male [name], never smoker, followed for Bronchiectasis/ recurrent pneumonia/Enterobacter/recurrent aspiration, complicated by DM, HTN, lung nodule, GERD, ischemic colitis/multiple surgeries, valvular heart disease/ TAVR, ASCVD, AFib, Depression,  --Albuterol neb, albuterol hfa, Symbicort 160,  Flutter, Covid- 3 Moderna Flu vax- We sent Zpak and prednisone in Feb. -----Acute. Patient having shortness of breath.    Arrival BP 170/104, HR 70, O2 sat 99% RA He had fallen and bathroom injuring shoulder and pending surgery when able.  Has oral morphine from orthopedist. Self-Pay Home oxygen at 2 L with portable.  He watches his oximeter and uses O2 as needed. He is a little stiff and still sore, shows me bruising on left lateral rib cage. CXR 09/25/21-  IMPRESSION: No radiographic evidence of thoracic trauma.  01/30/22- 71 year old male [name], never smoker, followed for Bronchiectasis/ recurrent pneumonia/Enterobacter/recurrent aspiration, complicated by DM, HTN, lung nodule, GERD, ischemic colitis/multiple surgeries, valvular heart disease/ TAVR, ASCVD, AFib, Depression,  --Albuterol neb, albuterol hfa, Symbicort 160,  Flutter, Covid- 3 Moderna Flu vax-  CXR 09/25/21- IMPRESSION: No radiographic evidence of thoracic trauma.    ROS-   += positive Constitutional:   No-   weight loss, night sweats, +fevers, chills, fatigue, lassitude. HEENT:   No-  headaches, difficulty swallowing, +tooth/dental problems, sore throat,        sneezing,  itching, ear ache, nasal congestion, post nasal drip,  CV:      chest pain, +orthopnea, PND, swelling in lower extremities, anasarca, dizziness, +palpitations Resp: + shortness of breath with exertion or at rest.                +productive cough,  No non-productive cough,              +change in color of mucus.  No- wheezing.   Skin: No-   rash or lesions. GI:  No-   heartburn, indigestion, +abdominal pain, nausea, vomiting,  GU:  MS:  +  joint pain or swelling.  Neuro-     nothing unusual Psych:  No- change in mood or affect. + depression or anxiety.  No memory loss  OBJ- Physical Exam    General- Alert, Oriented, + anxious/depressed, Distress- none acute, + overweight Skin- + fungal nail changes right hand and also reported on toes of both feet Lymphadenopathy- none Head- atraumatic            Eyes- Gross vision intact, PERRLA, conjunctivae and secretions clear            Ears- Hearing, canals-normal            Nose-  no-Septal dev,polyps, erosion, perforation             Throat- Mallampati II , mucosa clear , drainage- none, tonsils- atrophic. + Missing teeth,  Neck- flexible , trachea midline, no stridor , thyroid nl, carotid no bruit Chest - symmetrical excursion , unlabored           Heart/CV- RRR ,  murmur + 2/6 S aortic , no gallop  , no rub, nl s1 s2                           -  JVD- none , edema+1/ support hose, stasis changes- none, varices- none           Lung-+clear unlabored, cough-none, +talkative on room air                  dullness-none, rub- none           Chest wall- + bruising left rib cage Abd- + multiple surgical scars Br/ Gen/ Rectal- Not done, not indicated Extrem-  +cane,  Neuro- +mild word-searching

## 2022-01-30 ENCOUNTER — Ambulatory Visit: Payer: Medicare HMO | Admitting: Internal Medicine

## 2022-02-07 ENCOUNTER — Encounter (HOSPITAL_COMMUNITY): Payer: Self-pay

## 2022-02-07 ENCOUNTER — Other Ambulatory Visit: Payer: Self-pay

## 2022-02-07 ENCOUNTER — Emergency Department (HOSPITAL_COMMUNITY): Payer: Medicare HMO

## 2022-02-07 ENCOUNTER — Emergency Department (HOSPITAL_COMMUNITY)
Admission: EM | Admit: 2022-02-07 | Discharge: 2022-02-07 | Disposition: A | Discharge status: Home / Self Care | Payer: Medicare HMO | Attending: Emergency Medicine | Admitting: Emergency Medicine

## 2022-02-07 DIAGNOSIS — Z23 Encounter for immunization: Secondary | ICD-10-CM | POA: Diagnosis not present

## 2022-02-07 DIAGNOSIS — I2510 Atherosclerotic heart disease of native coronary artery without angina pectoris: Secondary | ICD-10-CM | POA: Diagnosis not present

## 2022-02-07 DIAGNOSIS — M26621 Arthralgia of right temporomandibular joint: Secondary | ICD-10-CM | POA: Insufficient documentation

## 2022-02-07 DIAGNOSIS — E119 Type 2 diabetes mellitus without complications: Secondary | ICD-10-CM | POA: Insufficient documentation

## 2022-02-07 DIAGNOSIS — Z7901 Long term (current) use of anticoagulants: Secondary | ICD-10-CM | POA: Insufficient documentation

## 2022-02-07 DIAGNOSIS — Z91040 Latex allergy status: Secondary | ICD-10-CM | POA: Diagnosis not present

## 2022-02-07 DIAGNOSIS — T07XXXA Unspecified multiple injuries, initial encounter: Secondary | ICD-10-CM

## 2022-02-07 DIAGNOSIS — J449 Chronic obstructive pulmonary disease, unspecified: Secondary | ICD-10-CM | POA: Diagnosis not present

## 2022-02-07 DIAGNOSIS — W109XXA Fall (on) (from) unspecified stairs and steps, initial encounter: Secondary | ICD-10-CM | POA: Diagnosis not present

## 2022-02-07 DIAGNOSIS — R0781 Pleurodynia: Secondary | ICD-10-CM | POA: Insufficient documentation

## 2022-02-07 DIAGNOSIS — S06301A Unspecified focal traumatic brain injury with loss of consciousness of 30 minutes or less, initial encounter: Secondary | ICD-10-CM | POA: Diagnosis not present

## 2022-02-07 DIAGNOSIS — Z7951 Long term (current) use of inhaled steroids: Secondary | ICD-10-CM | POA: Insufficient documentation

## 2022-02-07 DIAGNOSIS — S0101XA Laceration without foreign body of scalp, initial encounter: Secondary | ICD-10-CM | POA: Insufficient documentation

## 2022-02-07 DIAGNOSIS — Z7984 Long term (current) use of oral hypoglycemic drugs: Secondary | ICD-10-CM | POA: Insufficient documentation

## 2022-02-07 DIAGNOSIS — M79631 Pain in right forearm: Secondary | ICD-10-CM | POA: Diagnosis not present

## 2022-02-07 DIAGNOSIS — Z7952 Long term (current) use of systemic steroids: Secondary | ICD-10-CM | POA: Insufficient documentation

## 2022-02-07 DIAGNOSIS — S069X1A Unspecified intracranial injury with loss of consciousness of 30 minutes or less, initial encounter: Secondary | ICD-10-CM

## 2022-02-07 DIAGNOSIS — Y9341 Activity, dancing: Secondary | ICD-10-CM | POA: Insufficient documentation

## 2022-02-07 MED ORDER — FENTANYL CITRATE PF 50 MCG/ML IJ SOSY
100.0000 ug | PREFILLED_SYRINGE | Freq: Once | INTRAMUSCULAR | Status: AC
  Administered 2022-02-07: 100 ug via INTRAVENOUS
  Filled 2022-02-07: qty 2

## 2022-02-07 MED ORDER — TETANUS-DIPHTH-ACELL PERTUSSIS 5-2.5-18.5 LF-MCG/0.5 IM SUSY
0.5000 mL | PREFILLED_SYRINGE | Freq: Once | INTRAMUSCULAR | Status: AC
  Administered 2022-02-07: 0.5 mL via INTRAMUSCULAR
  Filled 2022-02-07: qty 0.5

## 2022-02-07 MED ORDER — ONDANSETRON 8 MG PO TBDP: 8.0000 mg | ORAL_TABLET | Freq: Three times a day (TID) | ORAL | 0 refills | Status: DC | PRN

## 2022-02-07 MED ORDER — ONDANSETRON HCL 4 MG/2ML IJ SOLN
4.0000 mg | Freq: Once | INTRAMUSCULAR | Status: AC
  Administered 2022-02-07: 4 mg via INTRAVENOUS
  Filled 2022-02-07: qty 2

## 2022-02-07 NOTE — Discharge Instructions (Addendum)
We saw you in the ER after you had a fall. °All the imaging results are normal, no fractures seen. No evidence of brain bleed. °Please be very careful with walking, and do everything possible to prevent falls. ° ° °

## 2022-02-07 NOTE — ED Provider Notes (Signed)
[facility] EMERGENCY DEPARTMENT Provider Note   CSN: [id number] Arrival date & time: 02/07/22  3086     History  Chief Complaint  Patient presents with  . Fall    [name] is a 71 y.o. male.  HPI    71 year old male comes in with chief complaint of fall.  Patient has history of diabetes, DVT on blood thinner, A-fib, stroke, depression and PTSD, severe aortic stenosis and multiple spine surgeries.  He comes into the emergency room after having a mechanical fall. Patient slipped outside while walking his dog.  He indicates that his body turned as he fell and is having pain primarily over his right rib area.  He is also having bleeding from the back of his head.    Home Medications Prior to Admission medications   Medication Sig Start Date End Date Taking? Authorizing Provider  albuterol (ACCUNEB) 0.63 MG/3ML nebulizer solution Take 1 ampule by nebulization every 6 (six) hours as needed for wheezing or shortness of breath.    Yes [provider]  ALPRAZolam Duanne Moron) 1 MG tablet Take 1 tablet (1 mg total) by mouth 3 (three) times daily as needed for anxiety or sleep. 01/22/14  Yes Theodis Blaze, MD  apixaban (ELIQUIS) 5 MG TABS tablet Take 5 mg by mouth 2 (two) times daily.   Yes [provider]  beclomethasone (BECONASE-AQ) 42 MCG/SPRAY nasal spray Place 1 spray into both nostrils daily.    Yes [provider]  budesonide-formoterol (SYMBICORT) 160-4.5 MCG/ACT inhaler INHALE 2 PUFFS INTO THE LUNGS TWICE A DAY RINSE MOUTH WELL AFTER USE Patient taking differently: Inhale 2 puffs into the lungs daily. 02/09/19  Yes Young, Tarri Fuller D, MD  carvedilol (COREG) 25 MG tablet Take 1 tablet (25 mg total) by mouth 2 (two) times daily. 06/15/18 02/07/22 Yes Leonie Man, MD  Cholecalciferol (VITAMIN D-3) 125 MCG (5000 UT) TABS Take 5,000 Units by mouth daily.   Yes [provider]  cyclobenzaprine (FLEXERIL) 10 MG tablet Take 5 mg by  mouth daily as needed for muscle spasms. 02/29/20  Yes [provider]  EPINEPHrine 0.3 mg/0.3 mL IJ SOAJ injection Inject 0.3 mg into the muscle once as needed for anaphylaxis. 09/09/21  Yes [provider]  fluticasone-salmeterol (ADVAIR) 250-50 MCG/ACT AEPB Inhale 1 puff into the lungs in the morning and at bedtime. 09/09/21  Yes [provider]  furosemide (LASIX) 20 MG tablet Take 1 tablet (20 mg total) by mouth 2 (two) times daily. As directed Patient taking differently: Take 20 mg by mouth daily as needed for fluid. 09/01/19  Yes Leonie Man, MD  hyoscyamine (LEVSIN) 0.125 MG tablet Take 1 tablet by mouth 3 (three) times daily as needed for bladder spasms. 02/13/14  Yes [provider]  levothyroxine (SYNTHROID) 25 MCG tablet Take 25 mcg by mouth daily. 02/16/20  Yes [provider]  losartan (COZAAR) 100 MG tablet Take 100 mg by mouth daily. 09/09/21  Yes [provider]  Magnesium Hydroxide (MILK OF MAGNESIA PO) Take 1 Dose by mouth as needed (constipation). 03/25/17  Yes [provider]  morphine (MSIR) 15 MG tablet Take 15 mg by mouth every 4 (four) hours as needed for severe pain. 09/25/17  Yes [provider]  Multiple Vitamin (MULTIVITAMIN WITH MINERALS) TABS tablet Take 1 tablet by mouth at bedtime.   Yes [provider]  nitroGLYCERIN (NITROSTAT) 0.4 MG SL tablet Place 1 tablet (0.4 mg total) under the tongue every  5 (five) minutes as needed for chest pain. 08/07/19  Yes Eileen Stanford, PA-C  ondansetron (ZOFRAN-ODT) 8 MG disintegrating tablet Take 1 tablet (8 mg total) by mouth every 8 (eight) hours as needed for nausea. 02/07/22  Yes Varney Biles, MD  promethazine (PHENERGAN) 25 MG tablet Take 25 mg by mouth 4 (four) times daily as needed for nausea or vomiting. 03/25/17  Yes [provider]  Propylene Glycol (SYSTANE COMPLETE OP) Place 1 drop into both eyes daily as needed (dry eyes).   Yes  [provider]  rosuvastatin (CRESTOR) 40 MG tablet Take 40 mg by mouth daily.   Yes [provider]  sertraline (ZOLOFT) 100 MG tablet Take 150 mg by mouth daily. 09/09/21  Yes [provider]  spironolactone (ALDACTONE) 25 MG tablet TAKE 1/2 TABLET BY MOUTH EVERY DAY Patient taking differently: Take 25 mg by mouth daily. 08/23/20  Yes Sherren Mocha, MD  SYMPROIC 0.2 MG TABS Take 0.2 mg by mouth daily as needed (constipation.).   Yes [provider]  azithromycin (ZITHROMAX Z-PAK) 250 MG tablet Take as directed Patient not taking: Reported on 02/07/2022 07/07/21   Baird Lyons D, MD  influenza vaccine adjuvanted (FLUAD QUADRIVALENT) 0.5 ML injection Inject into the muscle. Patient not taking: Reported on 01/26/2022 05/07/21   Carlyle Basques, MD  irbesartan (AVAPRO) 300 MG tablet TAKE 1 TABLET BY MOUTH EVERY DAY Patient not taking: Reported on 02/07/2022 11/28/19   Leonie Man, MD  predniSONE (DELTASONE) 10 MG tablet Take 4 tabs for 2 days, then 3 tabs for 2 days, 2 tabs for 2 days, then 1 tab for 2 days, then stop. Patient not taking: Reported on 02/07/2022 06/23/21   Deneise Lever, MD  Respiratory Therapy Supplies (FLUTTER) DEVI Use as directed 01/10/15   Deneise Lever, MD      Allergies    Bee venom, Bystolic [nebivolol hcl], Codeine, Doxycycline, Iodinated contrast media, Iohexol, Levaquin [levofloxacin], Oxycodone hcl, Stadol [butorphanol], Succinylcholine chloride, Tamiflu [oseltamivir phosphate], Atorvastatin, Synvisc [hylan g-f 20], Gemfibrozil, Latex, Lisinopril, Omeprazole, Dilaudid [hydromorphone hcl], Methocarbamol, Niacin, and Pentazocine lactate    Review of Systems   Review of Systems  Physical Exam Updated Vital Signs BP (!) 195/85   Pulse 65   Temp (!) 97.3 F (36.3 C) (Tympanic)   Resp 15   Ht '6\' 1"'$  (1.854 m)   Wt 110.2 kg   SpO2 99%   BMI 32.06 kg/m  Physical Exam Vitals and nursing note reviewed.  Constitutional:       Appearance: He is well-developed.  HENT:     Head: Atraumatic.  Neck:     Comments: In a c-collar, has generalized C-spine tenderness Cardiovascular:     Rate and Rhythm: Normal rate.  Pulmonary:     Effort: Pulmonary effort is normal.  Musculoskeletal:        General: Tenderness present. No deformity.     Comments: Tenderness over the right forearm, right TMJ  Skin:    General: Skin is warm.     Comments: 10 cm laceration over the vertex, with active bleeding that was controlled with pressure  Neurological:     Mental Status: He is alert and oriented to person, place, and time.    ED Results / Procedures / Treatments   Labs (all labs ordered are listed, but only abnormal results are displayed) Labs Reviewed  CBC WITH DIFFERENTIAL/PLATELET  BASIC METABOLIC PANEL    EKG None  Radiology DG Ribs Unilateral W/Chest Right  Result Date: 02/07/2022 CLINICAL DATA:  Fall with right rib pain EXAM: RIGHT RIBS AND CHEST - 3+ VIEW COMPARISON:  09/25/2021 FINDINGS: No fracture or other bone lesions are seen involving the ribs. There is no evidence of pneumothorax or pleural effusion. Both lungs are clear. Heart size and mediastinal contours are within normal limits. Transcatheter aortic valve replacement. IMPRESSION: Negative for rib fracture or pneumothorax. Electronically Signed   By: Jorje Guild M.D.   On: 02/07/2022 08:34   CT Head Wo Contrast  Result Date: 02/07/2022 CLINICAL DATA:  Fall on blood thinners EXAM: CT HEAD WITHOUT CONTRAST CT CERVICAL SPINE WITHOUT CONTRAST TECHNIQUE: Multidetector CT imaging of the head and cervical spine was performed following the standard protocol without intravenous contrast. Multiplanar CT image reconstructions of the cervical spine were also generated. RADIATION DOSE REDUCTION: This exam was performed according to the departmental dose-optimization program which includes automated exposure control, adjustment of the mA and/or kV according to patient  size and/or use of iterative reconstruction technique. COMPARISON:  None Available. FINDINGS: CT HEAD FINDINGS Brain: No evidence of acute infarction, hemorrhage, hydrocephalus, extra-axial collection or mass lesion/mass effect. Brain atrophy greatest around the sylvian fissures. Vascular: No hyperdense vessel or unexpected calcification. Skull: Hematoma at the vertex.  No acute fracture. Sinuses/Orbits: No visible injury CT CERVICAL SPINE FINDINGS Alignment: No traumatic malalignment Skull base and vertebrae: No acute fracture. Generalized osteopenic appearance Soft tissues and spinal canal: No prevertebral fluid or swelling. No visible canal hematoma. Disc levels: Generalized disc and facet degeneration with spurring. Asymmetric right-sided foraminal narrowing at C3-4 to C5-6 Upper chest: No visible injury IMPRESSION: 1. No evidence of acute intracranial or cervical spine injury. 2. Scalp hematoma without fracture. Electronically Signed   By: Jorje Guild M.D.   On: 02/07/2022 08:32   CT Cervical Spine Wo Contrast  Result Date: 02/07/2022 CLINICAL DATA:  Fall on blood thinners EXAM: CT HEAD WITHOUT CONTRAST CT CERVICAL SPINE WITHOUT CONTRAST TECHNIQUE: Multidetector CT imaging of the head and cervical spine was performed following the standard protocol without intravenous contrast. Multiplanar CT image reconstructions of the cervical spine were also generated. RADIATION DOSE REDUCTION: This exam was performed according to the departmental dose-optimization program which includes automated exposure control, adjustment of the mA and/or kV according to patient size and/or use of iterative reconstruction technique. COMPARISON:  None Available. FINDINGS: CT HEAD FINDINGS Brain: No evidence of acute infarction, hemorrhage, hydrocephalus, extra-axial collection or mass lesion/mass effect. Brain atrophy greatest around the sylvian fissures. Vascular: No hyperdense vessel or unexpected calcification. Skull: Hematoma  at the vertex.  No acute fracture. Sinuses/Orbits: No visible injury CT CERVICAL SPINE FINDINGS Alignment: No traumatic malalignment Skull base and vertebrae: No acute fracture. Generalized osteopenic appearance Soft tissues and spinal canal: No prevertebral fluid or swelling. No visible canal hematoma. Disc levels: Generalized disc and facet degeneration with spurring. Asymmetric right-sided foraminal narrowing at C3-4 to C5-6 Upper chest: No visible injury IMPRESSION: 1. No evidence of acute intracranial or cervical spine injury. 2. Scalp hematoma without fracture. Electronically Signed   By: Jorje Guild M.D.   On: 02/07/2022 08:32    Procedures .Marland KitchenLaceration Repair  Date/Time: 02/07/2022 9:14 AM  Performed by: Varney Biles, MD Authorized by: Varney Biles, MD   Consent:    Consent obtained:  Verbal   Consent given by:  Patient   Risks, benefits, and alternatives were discussed: yes     Risks discussed:  Pain, poor wound healing, poor cosmetic result, need for additional repair and infection  Universal protocol:    Procedure explained and questions answered to patient or proxy's satisfaction: yes     Patient identity confirmed:  Arm band Anesthesia:    Anesthesia method:  None Laceration details:    Location:  Scalp   Scalp location:  Crown   Length (cm):  10   Depth (mm):  5 Pre-procedure details:    Preparation:  Imaging obtained to evaluate for foreign bodies Exploration:    Limited defect created (wound extended): no     Hemostasis achieved with:  Direct pressure   Imaging outcome: foreign body not noted     Wound exploration: wound explored through full range of motion and entire depth of wound visualized     Wound extent: fascia violated and vascular damage     Contaminated: yes   Treatment:    Area cleansed with:  Soap and water   Amount of cleaning:  Extensive   Irrigation solution:  Sterile water   Irrigation volume:  50   Irrigation method:  Syringe and pressure  wash   Scar revision: no   Skin repair:    Repair method:  Staples   Number of staples:  9 Approximation:    Approximation:  Close Repair type:    Repair type:  Complex Post-procedure details:    Dressing:  Non-adherent dressing and adhesive bandage   Procedure completion:  Tolerated well, no immediate complications     Medications Ordered in ED Medications  fentaNYL (SUBLIMAZE) injection 100 mcg (100 mcg Intravenous Given 02/07/22 0741)  Tdap (BOOSTRIX) injection 0.5 mL (0.5 mLs Intramuscular Given 02/07/22 0736)  ondansetron (ZOFRAN) injection 4 mg (4 mg Intravenous Given 02/07/22 0915)    ED Course/ Medical Decision Making/ A&P Clinical Course as of 02/07/22 0919  Sat Feb 07, 2022  0910 Nursing staff having difficulty with IV access and blood draw.  CT scans have resulted.  No evidence of brain bleed or cervical spine fracture.  I reassessed the patient.  He indicates that he has been having a GI bug, but the fall was mechanical in nature because of slip.  He denies any dizziness or lightheadedness.  Wife is at the bedside.  States that yesterday patient was feeling sleepy, but now he is acting appropriately.  She wanted to make sure he did not have a stroke yesterday.  Patient had declined ER visit to her yesterday.  Currently patient is AOx3.  He is not sleepy or somnolent.  Cranial nerves II through XII intact, no nystagmus and upper and lower extremity strength is intact.  He is complaining of jaw pain.  However he is able to open and close his mouth without any difficulty.  Tenderness over the right TMJ.  He is able to speak.  He is able to swallow.  I do not think he has a jaw fracture.  CT max face not indicated at this time.  He is also complaining of forearm pain now.  Hand exam looks normal, there is no gross deformity over the arm or the forearm.  However there is some ecchymosis over the forearm and tenderness to palpation in the midshaft and distal shaft region.  We will  get x-ray of the forearm.  I do not think blood work is indicated at this time.  Hemodynamically patient is stable.  He is AOx3.  He is tolerating p.o.  We might give him some nausea medication to ensure he hydrates well. 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 Radiology: ordered.  Risk Prescription drug management.   This patient presents to the ED with chief complaint(s) of fall and resultant head injury.  With pertinent past medical history of DVT/A-fib on Eliquis, COPD, CAD, stroke.The complaint involves an extensive differential diagnosis and also carries with it a high risk of complications and morbidity.    The differential diagnosis includes : DDx includes: - ICH -subdural hematoma - Fractures - Contusions - Soft tissue injury -Right-sided rib fracture   The initial plan is to get CT scan of the brain and C-spine.  We will get x-ray of the ribs on the right side and forearm x-ray as well.   Additional history obtained: Additional history obtained from spouse Records reviewed  outpatient cardiology visit, reviewed patient's medications   Independent visualization and interpretation of imaging: - I independently visualized the following imaging with scope of interpretation limited to determining acute life threatening conditions related to emergency care: CT scan of the brain, which revealed no evidence of brain bleed.  Treatment and Reassessment: Patient is imaging findings are normal.  C-spine has been cleared now.  Collar removed. Laceration repaired with 9  staples. Requested nursing staff to clean the wound and apply hemostatic dressing.   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

## 2022-02-07 NOTE — ED Triage Notes (Signed)
Pt [facility] for a level 2 fall on thinners. Pt was taking his dog out and it had been raining and slipped and fell down 3-4 steps. Pt has a laceration to the back of the head and a skin tear on the left elbow and is c/o right rib cage pain. Pt is on Eliquis.

## 2022-02-08 ENCOUNTER — Encounter: Payer: Self-pay | Admitting: Cardiology

## 2022-02-08 DIAGNOSIS — D692 Other nonthrombocytopenic purpura: Secondary | ICD-10-CM | POA: Insufficient documentation

## 2022-02-08 NOTE — Assessment & Plan Note (Addendum)
Labile BP.  Seems to be stable right now.  Relatively euvolemic.  Edema is stable. No change in meds for now.  Is on carvedilol 25 mg twice daily, Avapro 300 mg daily, Lasix 20 mg twice daily or additional dose based on edema.  Also on spironolactone 25 mg daily.

## 2022-02-08 NOTE — Assessment & Plan Note (Signed)
He has chronic lower extremity edema-that has been present since related for his TAVR.  He is on standing dose of Lasix along with spironolactone.  Intermittently uses extra dose of Lasix.  Also wears for support hose when possible.  Edema seems pretty stable at this point, but may be to 3 times a week will take an extra dose.

## 2022-02-08 NOTE — Assessment & Plan Note (Signed)
Seems relatively frail skin.  To make matters worse is on anticoagulation.  Thankfully, no significant bleeding.

## 2022-02-08 NOTE — Assessment & Plan Note (Signed)
Last follow-up was a year and a half ago.  Echo was stable at that time.  He should be due for follow-up echo next April. Has not had any symptoms of heart failure.  NYHA class I-II but dyspnea is probably more related to his ongoing lung disease. Edema is usually venous stasis related.  Has been there before his TAVR.  Discussed importance of SBE prophylaxis.

## 2022-02-08 NOTE — Assessment & Plan Note (Addendum)
I do not see labs checked since 2020.  He is on rosuvastatin and seems to doing okay with it. Presumably PCP is checking labs but I do not have them available.  Last A1c that I see checked was in 2021-was 5.1.  Not sure if he truly meets criteria for diabetes. I do not currently see any medications on board at this time.

## 2022-02-23 NOTE — Progress Notes (Deleted)
HPI M Veteran, Never smoker followed for Bronchiectasis/recurrent pneumonia/Enterobacter due to reflux, complicated by DM, HBP, lung nodule, GERD, ischemic colitis/ multiple surgeries, valvular heart disease Office Spirometry 01/16/16-moderate restriction of exhaled volume, at least mild obstructive airways disease. FVC 3.02/59%, FEV1 2.13/56%, ratio 0.95, FEF 25-75% 1.28/42%  --------------------------------------------------------  09/25/21- 71 year old male [name], never smoker, followed for Bronchiectasis/ recurrent pneumonia/Enterobacter/recurrent aspiration, complicated by DM, HTN, lung nodule, GERD, ischemic colitis/multiple surgeries, valvular heart disease/ TAVR, ASCVD, AFib, Depression,  --Albuterol neb, albuterol hfa, Symbicort 160,  Flutter, Covid- 3 Moderna Flu vax- We sent Zpak and prednisone in Feb. -----Acute. Patient having shortness of breath.    Arrival BP 170/104, HR 70, O2 sat 99% RA He had fallen and bathroom injuring shoulder and pending surgery when able.  Has oral morphine from orthopedist. Self-Pay Home oxygen at 2 L with portable.  He watches his oximeter and uses O2 as needed. He is a little stiff and still sore it shows me bruising on left lateral rib cage. CXR 09/25/21-  IMPRESSION: No radiographic evidence of thoracic trauma.  02/24/22- 71 year old male [name], never smoker, followed for Bronchiectasis/ recurrent pneumonia/Enterobacter/recurrent aspiration, complicated by DM, HTN, lung nodule, GERD, ischemic colitis/multiple surgeries, valvular heart disease/ TAVR, ASCVD, AFib, Depression,  --Albuterol neb, albuterol hfa, Symbicort 160,  Flutter, Covid- 3 Moderna Flu vax-    ROS-   += positive Constitutional:   No-   weight loss, night sweats, +fevers, chills, fatigue, lassitude. HEENT:   No-  headaches, difficulty swallowing, +tooth/dental problems, sore throat,        sneezing, itching, ear ache, nasal congestion, post nasal drip,  CV:      chest pain,  +orthopnea, PND, swelling in lower extremities, anasarca, dizziness, +palpitations Resp: + shortness of breath with exertion or at rest.                +productive cough,  No non-productive cough,              +change in color of mucus.  No- wheezing.   Skin: No-   rash or lesions. GI:  No-   heartburn, indigestion, +abdominal pain, nausea, vomiting,  GU:  MS:  +  joint pain or swelling.  Neuro-     nothing unusual Psych:  No- change in mood or affect. + depression or anxiety.  No memory loss  OBJ- Physical Exam    General- Alert, Oriented, + anxious/depressed, Distress- none acute, + overweight Skin- + fungal nail changes right hand and also reported on toes of both feet Lymphadenopathy- none Head- atraumatic            Eyes- Gross vision intact, PERRLA, conjunctivae and secretions clear            Ears- Hearing, canals-normal            Nose-  no-Septal dev,polyps, erosion, perforation             Throat- Mallampati II , mucosa clear , drainage- none, tonsils- atrophic. + Missing teeth,  Neck- flexible , trachea midline, no stridor , thyroid nl, carotid no bruit Chest - symmetrical excursion , unlabored           Heart/CV- RRR ,  murmur + 2/6 S aortic , no gallop  , no rub, nl s1 s2                           - JVD- none , edema+1/ support hose, stasis changes- none,  varices- none           Lung-+clear unlabored, cough-none, +talkative on room air                  dullness-none, rub- none           Chest wall- + bruising left rib cage Abd- + multiple surgical scars Br/ Gen/ Rectal- Not done, not indicated Extrem-  +cane,  Neuro- +mild word-searching

## 2022-02-24 ENCOUNTER — Ambulatory Visit: Payer: Medicare HMO | Admitting: Internal Medicine

## 2022-03-15 NOTE — Progress Notes (Deleted)
HPI M Veteran, Never smoker followed for Bronchiectasis/recurrent pneumonia/Enterobacter due to reflux, complicated by DM, HBP, lung nodule, GERD, ischemic colitis/ multiple surgeries, valvular heart disease Office Spirometry 01/16/16-moderate restriction of exhaled volume, at least mild obstructive airways disease. FVC 3.02/59%, FEV1 2.13/56%, ratio 0.95, FEF 25-75% 1.28/42%  --------------------------------------------------------  09/25/21- 71 year old male [name], never smoker, followed for Bronchiectasis/ recurrent pneumonia/Enterobacter/recurrent aspiration, complicated by DM, HTN, lung nodule, GERD, ischemic colitis/multiple surgeries, valvular heart disease/ TAVR, ASCVD, AFib, Depression,  --Albuterol neb, albuterol hfa, Symbicort 160,  Flutter, Covid- 3 Moderna Flu vax- We sent Zpak and prednisone in Feb. -----Acute. Patient having shortness of breath.    Arrival BP 170/104, HR 70, O2 sat 99% RA He had fallen and bathroom injuring shoulder and pending surgery when able.  Has oral morphine from orthopedist. Self-Pay Home oxygen at 2 L with portable.  He watches his oximeter and uses O2 as needed. He is a little stiff and still sore it shows me bruising on left lateral rib cage. CXR 09/25/21-  IMPRESSION: No radiographic evidence of thoracic trauma.  03/17/22- 71 year old male [name], never smoker, followed for Bronchiectasis/ recurrent pneumonia/Enterobacter/recurrent aspiration, complicated by DM, HTN, lung nodule, GERD, ischemic colitis/multiple surgeries, valvular heart disease/ TAVR, ASCVD, AFib,dCHF,  Depression,CVA, PTSD,  Peripheral Autonomic Neuropathy,  --Albuterol neb, albuterol hfa, Symbicort 160,  Flutter, Covid- 3 Moderna Flu vax-  CXR 02/07/22-  FINDINGS: No fracture or other bone lesions are seen involving the ribs. There is no evidence of pneumothorax or pleural effusion. Both lungs are clear. Heart size and mediastinal contours are within normal  limits. Transcatheter aortic valve replacement. IMPRESSION: Negative for rib fracture or pneumothorax.     ROS-   += positive Constitutional:   No-   weight loss, night sweats, +fevers, chills, fatigue, lassitude. HEENT:   No-  headaches, difficulty swallowing, +tooth/dental problems, sore throat,        sneezing, itching, ear ache, nasal congestion, post nasal drip,  CV:      chest pain, +orthopnea, PND, swelling in lower extremities, anasarca, dizziness, +palpitations Resp: + shortness of breath with exertion or at rest.                +productive cough,  No non-productive cough,              +change in color of mucus.  No- wheezing.   Skin: No-   rash or lesions. GI:  No-   heartburn, indigestion, +abdominal pain, nausea, vomiting,  GU:  MS:  +  joint pain or swelling.  Neuro-     nothing unusual Psych:  No- change in mood or affect. + depression or anxiety.  No memory loss  OBJ- Physical Exam    General- Alert, Oriented, + anxious/depressed, Distress- none acute, + overweight Skin- + fungal nail changes right hand and also reported on toes of both feet Lymphadenopathy- none Head- atraumatic            Eyes- Gross vision intact, PERRLA, conjunctivae and secretions clear            Ears- Hearing, canals-normal            Nose-  no-Septal dev,polyps, erosion, perforation             Throat- Mallampati II , mucosa clear , drainage- none, tonsils- atrophic. + Missing teeth,  Neck- flexible , trachea midline, no stridor , thyroid nl, carotid no bruit Chest - symmetrical excursion , unlabored  Heart/CV- RRR ,  murmur + 2/6 S aortic , no gallop  , no rub, nl s1 s2                           - JVD- none , edema+1/ support hose, stasis changes- none, varices- none           Lung-+clear unlabored, cough-none, +talkative on room air                  dullness-none, rub- none           Chest wall- + bruising left rib cage Abd- + multiple surgical scars Br/ Gen/ Rectal- Not done,  not indicated Extrem-  +cane,  Neuro- +mild word-searching

## 2022-03-17 ENCOUNTER — Ambulatory Visit: Payer: Medicare HMO | Admitting: Internal Medicine

## 2022-04-12 NOTE — Progress Notes (Deleted)
HPI M Veteran, Never smoker followed for Bronchiectasis/recurrent pneumonia/Enterobacter due to reflux, complicated by DM, HBP, lung nodule, GERD, ischemic colitis/ multiple surgeries, valvular heart disease Office Spirometry 01/16/16-moderate restriction of exhaled volume, at least mild obstructive airways disease. FVC 3.02/59%, FEV1 2.13/56%, ratio 0.95, FEF 25-75% 1.28/42%  --------------------------------------------------------   01/30/21- 71 year old male [place], never smoker, followed for Bronchiectasis/ recurrent pneumonia/Enterobacter/recurrent aspiration, complicated by DM, HBP, lung nodule, GERD, ischemic colitis/multiple surgeries, valvular heart disease/ TAVR, ASCVD, AFib, Depression,  --Albuterol neb, albuterol hfa, Wixela 250 Flutter, Covid- 3 Moderna TAVR surgery did help strength and dyspnea. Considers breathing ok now, w/o significant wheeze or cough. Occasional use of rescue inhaler.  Pending R shoulder replacement.- Offers no concerns that would delay this. I don't suggest systemic steroids now unless he gets worse. CT chest 09/20/20 IMPRESSION: 1. Stable right-sided calcified and noncalcified pulmonary nodules the. No new or enlarging suspicious pulmonary nodules. 2. Mild distal esophageal wall thickening, which may reflect esophagitis. 3. Surgical changes of TAVR. 4. Aortic atherosclerosis. Aortic Atherosclerosis (ICD10-I70.0).  09/25/21- 71 year old male [name], never smoker, followed for Bronchiectasis/ recurrent pneumonia/Enterobacter/recurrent aspiration, complicated by DM, HTN, lung nodule, GERD, ischemic colitis/multiple surgeries, valvular heart disease/ TAVR, ASCVD, AFib, Depression,  --Albuterol neb, albuterol hfa, Symbicort 160,  Flutter, Covid- 3 Moderna Flu vax- We sent Zpak and prednisone in Feb. -----Acute. Patient having shortness of breath.    Arrival BP 170/104, HR 70, O2 sat 99% RA He had fallen and bathroom injuring shoulder and pending surgery when  able.  Has oral morphine from orthopedist. Self-Pay Home oxygen at 2 L with portable.  He watches his oximeter and uses O2 as needed. He is a little stiff and still sore it shows me bruising on left lateral rib cage. CXR 09/25/21-  IMPRESSION: No radiographic evidence of thoracic trauma.  04/14/22- 71 year old male [name], never smoker, followed for Bronchiectasis/ recurrent pneumonia/Enterobacter/recurrent aspiration, complicated by DM, HTN, lung nodule, GERD, ischemic colitis/multiple surgeries, valvular heart disease/ TAVR, ASCVD, AFib, Depression/ Anxiety,,  --Albuterol neb, albuterol hfa, Symbicort 160,  Flutter, Covid- 3 Moderna Flu vax-  CXR 02/07/22- IMPRESSION: Negative for rib fracture or pneumothorax.   ROS-   += positive Constitutional:   No-   weight loss, night sweats, +fevers, chills, fatigue, lassitude. HEENT:   No-  headaches, difficulty swallowing, +tooth/dental problems, sore throat,        sneezing, itching, ear ache, nasal congestion, post nasal drip,  CV:      chest pain, +orthopnea, PND, swelling in lower extremities, anasarca, dizziness, +palpitations Resp: + shortness of breath with exertion or at rest.                +productive cough,  No non-productive cough,              +change in color of mucus.  No- wheezing.   Skin: No-   rash or lesions. GI:  No-   heartburn, indigestion, +abdominal pain, nausea, vomiting,  GU:  MS:  +  joint pain or swelling.  Neuro-     nothing unusual Psych:  No- change in mood or affect. + depression or anxiety.  No memory loss  OBJ- Physical Exam    General- Alert, Oriented, + anxious/depressed, Distress- none acute, + overweight Skin- + fungal nail changes right hand and also reported on toes of both feet Lymphadenopathy- none Head- atraumatic            Eyes- Gross vision intact, PERRLA, conjunctivae and secretions clear  Ears- Hearing, canals-normal            Nose-  no-Septal dev,polyps, erosion, perforation              Throat- Mallampati II , mucosa clear , drainage- none, tonsils- atrophic. + Missing teeth,  Neck- flexible , trachea midline, no stridor , thyroid nl, carotid no bruit Chest - symmetrical excursion , unlabored           Heart/CV- RRR ,  murmur + 2/6 S aortic , no gallop  , no rub, nl s1 s2                           - JVD- none , edema+1/ support hose, stasis changes- none, varices- none           Lung-+clear unlabored, cough-none, +talkative on room air                  dullness-none, rub- none           Chest wall- + bruising left rib cage Abd- + multiple surgical scars Br/ Gen/ Rectal- Not done, not indicated Extrem-  +cane,  Neuro- +mild word-searching

## 2022-04-13 ENCOUNTER — Telehealth: Payer: Self-pay | Admitting: Internal Medicine

## 2022-04-13 NOTE — Telephone Encounter (Signed)
Called pt and still no answer- LMTCB

## 2022-04-13 NOTE — Telephone Encounter (Signed)
ATC LVMTCB x 1  

## 2022-04-14 ENCOUNTER — Ambulatory Visit: Payer: Medicare HMO | Admitting: Internal Medicine

## 2022-04-14 NOTE — Telephone Encounter (Signed)
Spoke to pt wife (per DPR) who states that she canceled OV with Dr. Annamaria Boots for tomorrow because although pt has been fighting the flu for 6 weeks he feels much better with no resp symptoms. Pt just wanted to let Dr. Annamaria Boots know. Nothing further needed at this time.    Routing to Dr. Annamaria Boots as Juluis Rainier

## 2022-09-14 ENCOUNTER — Ambulatory Visit (HOSPITAL_COMMUNITY): Payer: Medicare HMO

## 2022-10-12 ENCOUNTER — Ambulatory Visit (HOSPITAL_COMMUNITY): Payer: Medicare HMO

## 2022-11-12 ENCOUNTER — Ambulatory Visit (HOSPITAL_COMMUNITY): Payer: Medicare HMO

## 2022-11-19 ENCOUNTER — Ambulatory Visit (HOSPITAL_COMMUNITY)
Admission: RE | Admit: 2022-11-19 | Discharge: 2022-11-19 | Disposition: A | Discharge status: Home / Self Care | Payer: Medicare HMO | Source: Ambulatory Visit | Attending: Cardiology | Admitting: Cardiology

## 2022-11-19 ENCOUNTER — Ambulatory Visit (HOSPITAL_COMMUNITY): Admission: RE | Admit: 2022-11-19 | Payer: Medicare HMO | Source: Ambulatory Visit

## 2022-11-19 DIAGNOSIS — E785 Hyperlipidemia, unspecified: Secondary | ICD-10-CM | POA: Insufficient documentation

## 2022-11-19 DIAGNOSIS — I110 Hypertensive heart disease with heart failure: Secondary | ICD-10-CM | POA: Diagnosis not present

## 2022-11-19 DIAGNOSIS — E119 Type 2 diabetes mellitus without complications: Secondary | ICD-10-CM | POA: Insufficient documentation

## 2022-11-19 DIAGNOSIS — J449 Chronic obstructive pulmonary disease, unspecified: Secondary | ICD-10-CM | POA: Insufficient documentation

## 2022-11-19 DIAGNOSIS — Z8673 Personal history of transient ischemic attack (TIA), and cerebral infarction without residual deficits: Secondary | ICD-10-CM | POA: Insufficient documentation

## 2022-11-19 DIAGNOSIS — I5032 Chronic diastolic (congestive) heart failure: Secondary | ICD-10-CM | POA: Diagnosis present

## 2022-11-19 DIAGNOSIS — Z952 Presence of prosthetic heart valve: Secondary | ICD-10-CM | POA: Insufficient documentation

## 2022-11-19 DIAGNOSIS — I4891 Unspecified atrial fibrillation: Secondary | ICD-10-CM | POA: Diagnosis not present

## 2022-11-19 LAB — ECHOCARDIOGRAM COMPLETE
AR max vel: 1.63 cm2
AV Area VTI: 1.63 cm2
AV Area mean vel: 1.55 cm2
AV Mean grad: 20 mmHg
AV Peak grad: 37 mmHg
Ao pk vel: 3.04 m/s
Area-P 1/2: 1.94 cm2
Calc EF: 64.8 %
S' Lateral: 3.1 cm
Single Plane A2C EF: 64.6 %
Single Plane A4C EF: 62.3 %

## 2022-11-19 NOTE — Progress Notes (Signed)
Echocardiogram 2D Echocardiogram has been performed.  [name] 11/19/2022, 3:51 PM

## 2022-12-09 ENCOUNTER — Other Ambulatory Visit: Payer: Self-pay

## 2022-12-09 ENCOUNTER — Encounter (HOSPITAL_COMMUNITY): Payer: Self-pay

## 2022-12-09 ENCOUNTER — Emergency Department (HOSPITAL_COMMUNITY): Payer: Medicare HMO

## 2022-12-09 ENCOUNTER — Observation Stay (HOSPITAL_BASED_OUTPATIENT_CLINIC_OR_DEPARTMENT_OTHER)
Admission: EM | Admit: 2022-12-09 | Discharge: 2022-12-10 | Disposition: A | Discharge status: Home / Self Care | Payer: Medicare HMO | Source: Home / Self Care | Attending: Emergency Medicine | Admitting: Emergency Medicine

## 2022-12-09 DIAGNOSIS — E039 Hypothyroidism, unspecified: Secondary | ICD-10-CM | POA: Insufficient documentation

## 2022-12-09 DIAGNOSIS — A419 Sepsis, unspecified organism: Secondary | ICD-10-CM | POA: Insufficient documentation

## 2022-12-09 DIAGNOSIS — I4819 Other persistent atrial fibrillation: Secondary | ICD-10-CM | POA: Diagnosis present

## 2022-12-09 DIAGNOSIS — D509 Iron deficiency anemia, unspecified: Secondary | ICD-10-CM | POA: Insufficient documentation

## 2022-12-09 DIAGNOSIS — Z91040 Latex allergy status: Secondary | ICD-10-CM | POA: Insufficient documentation

## 2022-12-09 DIAGNOSIS — R2243 Localized swelling, mass and lump, lower limb, bilateral: Secondary | ICD-10-CM | POA: Insufficient documentation

## 2022-12-09 DIAGNOSIS — Z7901 Long term (current) use of anticoagulants: Secondary | ICD-10-CM | POA: Insufficient documentation

## 2022-12-09 DIAGNOSIS — S0990XA Unspecified injury of head, initial encounter: Principal | ICD-10-CM | POA: Insufficient documentation

## 2022-12-09 DIAGNOSIS — Z1152 Encounter for screening for COVID-19: Secondary | ICD-10-CM | POA: Insufficient documentation

## 2022-12-09 DIAGNOSIS — R7989 Other specified abnormal findings of blood chemistry: Secondary | ICD-10-CM | POA: Insufficient documentation

## 2022-12-09 DIAGNOSIS — S32018A Other fracture of first lumbar vertebra, initial encounter for closed fracture: Secondary | ICD-10-CM | POA: Insufficient documentation

## 2022-12-09 DIAGNOSIS — M542 Cervicalgia: Secondary | ICD-10-CM | POA: Insufficient documentation

## 2022-12-09 DIAGNOSIS — E871 Hypo-osmolality and hyponatremia: Secondary | ICD-10-CM | POA: Insufficient documentation

## 2022-12-09 DIAGNOSIS — K5900 Constipation, unspecified: Secondary | ICD-10-CM | POA: Diagnosis present

## 2022-12-09 DIAGNOSIS — R110 Nausea: Secondary | ICD-10-CM | POA: Insufficient documentation

## 2022-12-09 DIAGNOSIS — R6510 Systemic inflammatory response syndrome (SIRS) of non-infectious origin without acute organ dysfunction: Secondary | ICD-10-CM | POA: Insufficient documentation

## 2022-12-09 DIAGNOSIS — I10 Essential (primary) hypertension: Secondary | ICD-10-CM | POA: Insufficient documentation

## 2022-12-09 DIAGNOSIS — R7881 Bacteremia: Secondary | ICD-10-CM | POA: Diagnosis not present

## 2022-12-09 DIAGNOSIS — R509 Fever, unspecified: Secondary | ICD-10-CM | POA: Diagnosis present

## 2022-12-09 DIAGNOSIS — E1169 Type 2 diabetes mellitus with other specified complication: Secondary | ICD-10-CM | POA: Diagnosis present

## 2022-12-09 DIAGNOSIS — E782 Mixed hyperlipidemia: Secondary | ICD-10-CM | POA: Diagnosis present

## 2022-12-09 DIAGNOSIS — T826XXA Infection and inflammatory reaction due to cardiac valve prosthesis, initial encounter: Secondary | ICD-10-CM | POA: Diagnosis not present

## 2022-12-09 DIAGNOSIS — Y92009 Unspecified place in unspecified non-institutional (private) residence as the place of occurrence of the external cause: Secondary | ICD-10-CM

## 2022-12-09 DIAGNOSIS — W19XXXA Unspecified fall, initial encounter: Secondary | ICD-10-CM | POA: Insufficient documentation

## 2022-12-09 DIAGNOSIS — E119 Type 2 diabetes mellitus without complications: Secondary | ICD-10-CM | POA: Insufficient documentation

## 2022-12-09 DIAGNOSIS — J449 Chronic obstructive pulmonary disease, unspecified: Secondary | ICD-10-CM | POA: Insufficient documentation

## 2022-12-09 LAB — CBC WITH DIFFERENTIAL/PLATELET
Abs Immature Granulocytes: 0.08 10*3/uL — ABNORMAL HIGH (ref 0.00–0.07)
Basophils Absolute: 0.1 10*3/uL (ref 0.0–0.1)
Basophils Relative: 1 %
Eosinophils Absolute: 0.1 10*3/uL (ref 0.0–0.5)
Eosinophils Relative: 1 %
HCT: 33.2 % — ABNORMAL LOW (ref 39.0–52.0)
Hemoglobin: 10.7 g/dL — ABNORMAL LOW (ref 13.0–17.0)
Immature Granulocytes: 1 %
Lymphocytes Relative: 9 %
Lymphs Abs: 1.4 10*3/uL (ref 0.7–4.0)
MCH: 24.1 pg — ABNORMAL LOW (ref 26.0–34.0)
MCHC: 32.2 g/dL (ref 30.0–36.0)
MCV: 74.8 fL — ABNORMAL LOW (ref 80.0–100.0)
Monocytes Absolute: 0.7 10*3/uL (ref 0.1–1.0)
Monocytes Relative: 5 %
Neutro Abs: 13.1 10*3/uL — ABNORMAL HIGH (ref 1.7–7.7)
Neutrophils Relative %: 83 %
Platelets: 318 10*3/uL (ref 150–400)
RBC: 4.44 MIL/uL (ref 4.22–5.81)
RDW: 15.9 % — ABNORMAL HIGH (ref 11.5–15.5)
WBC: 15.4 10*3/uL — ABNORMAL HIGH (ref 4.0–10.5)
nRBC: 0 % (ref 0.0–0.2)

## 2022-12-09 LAB — BRAIN NATRIURETIC PEPTIDE: B Natriuretic Peptide: 290 pg/mL — ABNORMAL HIGH (ref 0.0–100.0)

## 2022-12-09 LAB — COMPREHENSIVE METABOLIC PANEL
ALT: 15 U/L (ref 0–44)
AST: 18 U/L (ref 15–41)
Albumin: 2.8 g/dL — ABNORMAL LOW (ref 3.5–5.0)
Alkaline Phosphatase: 91 U/L (ref 38–126)
Anion gap: 11 (ref 5–15)
BUN: 14 mg/dL (ref 8–23)
CO2: 20 mmol/L — ABNORMAL LOW (ref 22–32)
Calcium: 8.7 mg/dL — ABNORMAL LOW (ref 8.9–10.3)
Chloride: 94 mmol/L — ABNORMAL LOW (ref 98–111)
Creatinine, Ser: 0.81 mg/dL (ref 0.61–1.24)
GFR, Estimated: >60 mL/min (ref >60)
Glucose, Bld: 109 mg/dL — ABNORMAL HIGH (ref 70–99)
Potassium: 4 mmol/L (ref 3.5–5.1)
Sodium: 125 mmol/L — ABNORMAL LOW (ref 135–145)
Total Bilirubin: 1 mg/dL (ref 0.3–1.2)
Total Protein: 8 g/dL (ref 6.5–8.1)

## 2022-12-09 LAB — URINALYSIS, W/ REFLEX TO CULTURE (INFECTION SUSPECTED)
Bilirubin Urine: NEGATIVE
Glucose, UA: NEGATIVE mg/dL
Ketones, ur: 5 mg/dL — AB
Leukocytes,Ua: NEGATIVE
Nitrite: NEGATIVE
Protein, ur: NEGATIVE mg/dL
Specific Gravity, Urine: 1.014 (ref 1.005–1.030)
pH: 5 (ref 5.0–8.0)

## 2022-12-09 LAB — RESP PANEL BY RT-PCR (RSV, FLU A&B, COVID)  RVPGX2
Influenza A by PCR: NEGATIVE
Influenza B by PCR: NEGATIVE
Resp Syncytial Virus by PCR: NEGATIVE
SARS Coronavirus 2 by RT PCR: NEGATIVE

## 2022-12-09 LAB — PROTIME-INR
INR: 1.1 (ref 0.8–1.2)
Prothrombin Time: 13.9 seconds (ref 11.4–15.2)

## 2022-12-09 LAB — LACTIC ACID, PLASMA: Lactic Acid, Venous: 1 mmol/L (ref 0.5–1.9)

## 2022-12-09 LAB — APTT: aPTT: 33 seconds (ref 24–36)

## 2022-12-09 MED ORDER — VANCOMYCIN HCL 2000 MG/400ML IV SOLN
2000.0000 mg | Freq: Once | INTRAVENOUS | Status: AC
  Administered 2022-12-10: 2000 mg via INTRAVENOUS
  Filled 2022-12-09: qty 400

## 2022-12-09 MED ORDER — LACTATED RINGERS IV BOLUS (SEPSIS): 1000.0000 mL | Freq: Once | INTRAVENOUS | Status: DC

## 2022-12-09 MED ORDER — SODIUM CHLORIDE 0.9 % IV SOLN
2.0000 g | Freq: Once | INTRAVENOUS | Status: AC
  Administered 2022-12-09: 2 g via INTRAVENOUS
  Filled 2022-12-09: qty 12.5

## 2022-12-09 MED ORDER — VANCOMYCIN HCL IN DEXTROSE 1-5 GM/200ML-% IV SOLN: 1000.0000 mg | Freq: Once | INTRAVENOUS | Status: DC

## 2022-12-09 MED ORDER — LACTATED RINGERS IV BOLUS (SEPSIS): 500.0000 mL | Freq: Once | INTRAVENOUS | Status: DC

## 2022-12-09 MED ORDER — METRONIDAZOLE 500 MG/100ML IV SOLN
500.0000 mg | Freq: Once | INTRAVENOUS | Status: AC
  Administered 2022-12-10: 500 mg via INTRAVENOUS
  Filled 2022-12-09: qty 100

## 2022-12-09 MED ORDER — ACETAMINOPHEN 500 MG PO TABS
1000.0000 mg | ORAL_TABLET | Freq: Once | ORAL | Status: AC
  Administered 2022-12-09: 1000 mg via ORAL
  Filled 2022-12-09: qty 2

## 2022-12-09 MED ORDER — LACTATED RINGERS IV BOLUS (SEPSIS)
1000.0000 mL | Freq: Once | INTRAVENOUS | Status: AC
  Administered 2022-12-09: 1000 mL via INTRAVENOUS

## 2022-12-09 MED ORDER — LACTATED RINGERS IV SOLN: INTRAVENOUS | Status: DC

## 2022-12-09 NOTE — ED Provider Notes (Addendum)
[place] [facility]GENC[facility] DE[facility]MENT AT [facility] Provider Note   CSN: [id number] Arrival date & time: 12/09/22  2044     History  Chief Complaint  Patient presents with   [name] TIERRA DIVELBISS is a 72 y.o. male.  Patient brought in by EMS for a fall that occurred when he kind of stumbled over his walker.  It was going into the house and fell hitting the head on the floor hematoma to the right forehead no loss of conscious no complaint of neck pain upper back pain or lower back pain.  Patient noted to have a fever but he did not notice that he had a fever he has had generalized weakness and has had a significant weight loss over the last year and a half.  Patient is on Eliquis for atrial fibs.  Temp is 101.5 heart rate is 97 blood pressure 179/97 oxygen sats are 99%.  Past medical history significant for diabetes hyperlipidemia hypertension COPD small bowel obstruction in 2015 chronic back pain mild dementia posttraumatic stress disorder history of CVA history of deep vein thrombosis.  Status post TAVR trans catheter aortic valve replacement March 2021.  That has been doing well according to his wife.  Patient is never used tobacco products.  Patient denies any respiratory symptoms.  Denies any dysuria.  Has had some nausea is the only complaint that he really has.  No real abdominal pain no chest pain.       Home Medications Prior to Admission medications   Medication Sig Start Date End Date Taking? Authorizing Provider  albuterol (ACCUNEB) 0.63 MG/3ML nebulizer solution Take 1 ampule by nebulization every 6 (six) hours as needed for wheezing or shortness of breath.     [provider]  ALPRAZolam Prudy Feeler) 1 MG tablet Take 1 tablet (1 mg total) by mouth 3 (three) times daily as needed for anxiety or sleep. 01/22/14   Dorothea Ogle, MD  apixaban (ELIQUIS) 5 MG TABS tablet Take 5 mg by mouth 2 (two) times daily.    [provider]  azithromycin (ZITHROMAX  Z-PAK) 250 MG tablet Take as directed Patient not taking: Reported on 02/07/2022 07/07/21   Waymon Budge, MD  beclomethasone (BECONASE-AQ) 42 MCG/SPRAY nasal spray Place 1 spray into both nostrils daily.     [provider]  budesonide-formoterol (SYMBICORT) 160-4.5 MCG/ACT inhaler INHALE 2 PUFFS INTO THE LUNGS TWICE A DAY RINSE MOUTH WELL AFTER USE Patient taking differently: Inhale 2 puffs into the lungs daily. 02/09/19   Jetty Duhamel D, MD  carvedilol (COREG) 25 MG tablet Take 1 tablet (25 mg total) by mouth 2 (two) times daily. 06/15/18 02/07/22  Marykay Lex, MD  Cholecalciferol (VITAMIN D-3) 125 MCG (5000 UT) TABS Take 5,000 Units by mouth daily.    [provider]  cyclobenzaprine (FLEXERIL) 10 MG tablet Take 5 mg by mouth daily as needed for muscle spasms. 02/29/20   [provider]  EPINEPHrine 0.3 mg/0.3 mL IJ SOAJ injection Inject 0.3 mg into the muscle once as needed for anaphylaxis. 09/09/21   [provider]  fluticasone-salmeterol (ADVAIR) 250-50 MCG/ACT AEPB Inhale 1 puff into the lungs in the morning and at bedtime. 09/09/21   [provider]  furosemide (LASIX) 20 MG tablet Take 1 tablet (20 mg total) by mouth 2 (two) times daily. As directed Patient taking differently: Take 20 mg by mouth daily as needed for fluid. 09/01/19   Marykay Lex, MD  hyoscyamine (  LEVSIN) 0.125 MG tablet Take 1 tablet by mouth 3 (three) times daily as needed for bladder spasms. 02/13/14   [provider]  influenza vaccine adjuvanted (FLUAD QUADRIVALENT) 0.5 ML injection Inject into the muscle. Patient not taking: Reported on 01/26/2022 05/07/21   Judyann Munson, MD  irbesartan (AVAPRO) 300 MG tablet TAKE 1 TABLET BY MOUTH EVERY DAY Patient not taking: Reported on 02/07/2022 11/28/19   Marykay Lex, MD  levothyroxine (SYNTHROID) 25 MCG tablet Take 25 mcg by mouth daily. 02/16/20   [provider]  losartan (COZAAR) 100 MG tablet Take 100 mg by  mouth daily. 09/09/21   [provider]  Magnesium Hydroxide (MILK OF MAGNESIA PO) Take 1 Dose by mouth as needed (constipation). 03/25/17   [provider]  morphine (MSIR) 15 MG tablet Take 15 mg by mouth every 4 (four) hours as needed for severe pain. 09/25/17   [provider]  Multiple Vitamin (MULTIVITAMIN WITH MINERALS) TABS tablet Take 1 tablet by mouth at bedtime.    [provider]  nitroGLYCERIN (NITROSTAT) 0.4 MG SL tablet Place 1 tablet (0.4 mg total) under the tongue every 5 (five) minutes as needed for chest pain. 08/07/19   Janetta Hora, PA-C  ondansetron (ZOFRAN-ODT) 8 MG disintegrating tablet Take 1 tablet (8 mg total) by mouth every 8 (eight) hours as needed for nausea. 02/07/22   Derwood Kaplan, MD  predniSONE (DELTASONE) 10 MG tablet Take 4 tabs for 2 days, then 3 tabs for 2 days, 2 tabs for 2 days, then 1 tab for 2 days, then stop. Patient not taking: Reported on 02/07/2022 06/23/21   Waymon Budge, MD  promethazine (PHENERGAN) 25 MG tablet Take 25 mg by mouth 4 (four) times daily as needed for nausea or vomiting. 03/25/17   [provider]  Propylene Glycol (SYSTANE COMPLETE OP) Place 1 drop into both eyes daily as needed (dry eyes).    [provider]  Respiratory Therapy Supplies (FLUTTER) DEVI Use as directed 01/10/15   Jetty Duhamel D, MD  rosuvastatin (CRESTOR) 40 MG tablet Take 40 mg by mouth daily.    [provider]  sertraline (ZOLOFT) 100 MG tablet Take 150 mg by mouth daily. 09/09/21   [provider]  spironolactone (ALDACTONE) 25 MG tablet TAKE 1/2 TABLET BY MOUTH EVERY DAY Patient taking differently: Take 25 mg by mouth daily. 08/23/20   Tonny Bollman, MD  SYMPROIC 0.2 MG TABS Take 0.2 mg by mouth daily as needed (constipation.).    [provider]      Allergies    Bee venom, Bystolic [nebivolol hcl], Codeine, Doxycycline, Iodinated contrast media, Iohexol, Levaquin  [levofloxacin], Oxycodone hcl, Stadol [butorphanol], Succinylcholine chloride, Tamiflu [oseltamivir phosphate], Atorvastatin, Synvisc [hylan g-f 20], Gemfibrozil, Latex, Lisinopril, Omeprazole, Dilaudid [hydromorphone hcl], Methocarbamol, Niacin, and Pentazocine lactate    Review of Systems   Review of Systems  Constitutional:  Positive for fatigue and fever. Negative for chills.  HENT:  Negative for ear pain and sore throat.   Eyes:  Negative for pain and visual disturbance.  Respiratory:  Negative for cough and shortness of breath.   Cardiovascular:  Negative for chest pain and palpitations.  Gastrointestinal:  Positive for nausea. Negative for abdominal pain and vomiting.  Genitourinary:  Negative for dysuria and hematuria.  Musculoskeletal:  Negative for arthralgias and back pain.  Skin:  Negative for color change and rash.  Neurological:  Positive for weakness. Negative for seizures and syncope.  All other systems reviewed  and are negative.   Physical Exam Updated Vital Signs BP (!) 158/100   Pulse 94   Temp (!) 101.5 F (38.6 C) (Oral)   Resp 13   Ht 1.854 m (6\' 1" )   Wt 110.2 kg   SpO2 97%   BMI 32.05 kg/m  Physical Exam Vitals and nursing note reviewed.  Constitutional:      General: He is not in acute distress.    Appearance: Normal appearance. He is well-developed.  HENT:     Head: Normocephalic.     Comments: Hematoma with an abrasion to the right forehead lateral area.    Mouth/Throat:     Mouth: Mucous membranes are dry.  Eyes:     Extraocular Movements: Extraocular movements intact.     Conjunctiva/sclera: Conjunctivae normal.     Pupils: Pupils are equal, round, and reactive to light.  Cardiovascular:     Rate and Rhythm: Normal rate and regular rhythm.     Heart sounds: No murmur heard. Pulmonary:     Effort: Pulmonary effort is normal. No respiratory distress.     Breath sounds: Normal breath sounds. No wheezing, rhonchi or rales.  Abdominal:      Palpations: Abdomen is soft.     Tenderness: There is no abdominal tenderness. There is no guarding.  Musculoskeletal:        General: No swelling.     Cervical back: Normal range of motion and neck supple. No rigidity or tenderness.     Right lower leg: Edema present.     Left lower leg: Edema present.     Comments: Some mild swelling to both legs.  Skin:    General: Skin is warm and dry.     Capillary Refill: Capillary refill takes less than 2 seconds.  Neurological:     General: No focal deficit present.     Mental Status: He is alert. Mental status is at baseline.  Psychiatric:        Mood and Affect: Mood normal.     ED Results / Procedures / Treatments   Labs (all labs ordered are listed, but only abnormal results are displayed) Labs Reviewed  COMPREHENSIVE METABOLIC PANEL - Abnormal; Notable for the following components:      Result Value   Sodium 125 (*)    Chloride 94 (*)    CO2 20 (*)    Glucose, Bld 109 (*)    Calcium 8.7 (*)    Albumin 2.8 (*)    All other components within normal limits  CBC WITH DIFFERENTIAL/PLATELET - Abnormal; Notable for the following components:   WBC 15.4 (*)    Hemoglobin 10.7 (*)    HCT 33.2 (*)    MCV 74.8 (*)    MCH 24.1 (*)    RDW 15.9 (*)    Neutro Abs 13.1 (*)    Abs Immature Granulocytes 0.08 (*)    All other components within normal limits  URINALYSIS, W/ REFLEX TO CULTURE (INFECTION SUSPECTED) - Abnormal; Notable for the following components:   Hgb urine dipstick SMALL (*)    Ketones, ur 5 (*)    Bacteria, UA RARE (*)    All other components within normal limits  CULTURE, BLOOD (ROUTINE X 2)  CULTURE, BLOOD (ROUTINE X 2)  RESP PANEL BY RT-PCR (RSV, FLU A&B, COVID)  RVPGX2  LACTIC ACID, PLASMA  PROTIME-INR  APTT  BRAIN NATRIURETIC PEPTIDE    EKG EKG Interpretation Date/Time:  Wednesday December 09 2022 20:53:02 EDT Ventricular Rate:  96 PR Interval:  149 QRS Duration:  88 QT Interval:  360 QTC Calculation: 455 R  Axis:   -34  Text Interpretation: Sinus rhythm Probable left ventricular hypertrophy Inferior infarct, age indeterminate Confirmed by Vanetta Mulders [phone]) on 12/09/2022 10:13:27 PM  Radiology CT Head Wo Contrast  Result Date: 12/09/2022 CLINICAL DATA:  Fall hitting head on floor. Right forehead hematoma. EXAM: CT HEAD WITHOUT CONTRAST TECHNIQUE: Contiguous axial images were obtained from the base of the skull through the vertex without intravenous contrast. RADIATION DOSE REDUCTION: This exam was performed according to the departmental dose-optimization program which includes automated exposure control, adjustment of the mA and/or kV according to patient size and/or use of iterative reconstruction technique. COMPARISON:  Head CT 02/07/2022 FINDINGS: Brain: No intracranial hemorrhage, mass effect, or midline shift. Generalized atrophy with mild progression from prior. No hydrocephalus. The basilar cisterns are patent. Minimal periventricular chronic small vessel ischemic change. Encephalomalacia in the posteroinferior left frontal lobe is new from prior exam, but likely represents interval ischemia. No evidence of territorial infarct or acute ischemia. No extra-axial or intracranial fluid collection. Vascular: Atherosclerosis of skullbase vasculature without hyperdense vessel or abnormal calcification. Skull: No fracture or focal lesion. Sinuses/Orbits: Mucosal thickening throughout the ethmoid air cells, chronic. No acute findings. Other: Right frontal scalp hematoma. IMPRESSION: 1. Right frontal scalp hematoma. No acute intracranial abnormality. No skull fracture. 2. Generalized atrophy and chronic small vessel ischemic change. Encephalomalacia in the posteroinferior left frontal lobe is new from September 2023 exam, likely represents interval ischemia. Electronically Signed   By: Narda Rutherford M.D.   On: 12/09/2022 22:21   DG Chest 2 View  Result Date: 12/09/2022 CLINICAL DATA:  Suspected sepsis.  EXAM: CHEST - 2 VIEW COMPARISON:  Chest radiograph dated 02/07/2022. FINDINGS: No focal consolidation, pleural effusion, or pneumothorax. Top-normal cardiac size. Aortic valve repair. Atherosclerotic calcification of the aorta. No acute osseous pathology. IMPRESSION: No active cardiopulmonary disease. Electronically Signed   By: Elgie Collard M.D.   On: 12/09/2022 21:39    Procedures Procedures    Medications Ordered in ED Medications  lactated ringers infusion (has no administration in time range)  ceFEPIme (MAXIPIME) 2 g in sodium chloride 0.9 % 100 mL IVPB (has no administration in time range)  metroNIDAZOLE (FLAGYL) IVPB 500 mg (has no administration in time range)  lactated ringers bolus 1,000 mL (has no administration in time range)    And  lactated ringers bolus 1,000 mL (has no administration in time range)    And  lactated ringers bolus 1,000 mL (has no administration in time range)    And  lactated ringers bolus 500 mL (has no administration in time range)  acetaminophen (TYLENOL) tablet 1,000 mg (has no administration in time range)  vancomycin (VANCOREADY) IVPB 2000 mg/400 mL (has no administration in time range)    ED Course/ Medical Decision Making/ A&P                             Medical Decision Making Amount and/or Complexity of Data Reviewed Labs: ordered. Radiology: ordered.  Risk OTC drugs. Prescription drug management. Decision regarding hospitalization.   Patient status post fall.  Head CT without any acute findings.  Chest x-ray without any acute findings.  The patient here with a significant fever up to 101.5.  White blood cell count is 15,000.  Lactic acid first 1 was normal.  Patient not hypotensive but borderline tachycardia with heart rate up  around 96.  Very concerning for possible sepsis or acute infection.  Will start sepsis protocol broad-spectrum antibiotics.  Urinalysis is now back not consistent with urinary tract infection.  Blood  cultures x 2 are pending.  Complete metabolic panel significant for sodium of 125 CO2 was 20 creatinine good at 0.81 GFR greater than 60 LFTs are normal.  Anion gap of 11.  White count 15.4 hemoglobin 10.7 platelets 318.  INR 1.1.  I will go ahead and CT his chest abdomen and pelvis has been without IV contrast because he has significant allergy.  Looking for potential source of the infection.  Patient most likely will require admission.  COVID testing also ordered.  CRITICAL CARE Performed by: Vanetta Mulders Total critical care time: 45 minutes Critical care time was exclusive of separately billable procedures and treating other patients. Critical care was necessary to treat or prevent imminent or life-threatening deterioration. Critical care was time spent personally by me on the following activities: development of treatment plan with patient and/or surrogate as well as nursing, discussions with consultants, evaluation of patient's response to treatment, examination of patient, obtaining history from patient or surrogate, ordering and performing treatments and interventions, ordering and review of laboratory studies, ordering and review of radiographic studies, pulse oximetry and re-evaluation of patient's condition.  Since of workup was extended.  Respiratory panel still pending.  CT chest abdomen and pelvis without any acute findings.  Abdomen has been constipation may be a little bit of an ileus.  But nothing to explain the fever.  No evidence of any pneumonia.  Also no evidence of pulmonary edema.  But patient's BNP was elevated.  So will not do the full 30 cc/kg fluid challenge.  Patient receiving broad-spectrum antibiotics.  Patient also a little bit tenderness in the lumbar part of the back but patient also had a significant fall a year ago so it is hard to tell whether the L1 superior endplate fracture is acute or not.  But either way it is stable.  Patient will require admission for  the hyponatremia and possible sepsis.     Final Clinical Impression(s) / ED Diagnoses Final diagnoses:  Fall, initial encounter  Injury of head, initial encounter  Sepsis, due to unspecified organism, unspecified whether acute organ dysfunction present [facility])    Rx / DC Orders ED Discharge Orders     None         Vanetta Mulders, MD 12/09/22 1610    Vanetta Mulders, MD 12/09/22 2333

## 2022-12-09 NOTE — ED Notes (Signed)
Pt has temp of 101.5 oral, arrives with malodorous urine smell. Denies any urinary symptoms at this time

## 2022-12-09 NOTE — ED Triage Notes (Signed)
Pt arrived via [facility] from home for fall, pt uses walker, he was using at the time, going into house and fell hitting head on floor- hematoma noted to right forehead, no LOC, pt does have fever and generalized weakness, had reg check up at PCP yesterday. Pt on Eliquis.

## 2022-12-09 NOTE — Sepsis Progress Note (Signed)
Following for sepsis monitoring ?

## 2022-12-09 NOTE — ED Provider Notes (Signed)
  Provider Note MRN:  [id number]  Arrival date & time: 12/10/22    ED Course and Medical Decision Making  Assumed care from Dr. Deretha Emory at shift change.  Fever of unclear source, question sepsis.  Also hyponatremia, plan is for admission.  Fall today, not concern for significant injuries.  Favoring chronic L1 abnormality.  Procedures  Final Clinical Impressions(s) / ED Diagnoses     ICD-10-CM   1. Fall, initial encounter  W19.XXXA     2. Injury of head, initial encounter  S09.90XA     3. Sepsis, due to unspecified organism, unspecified whether acute organ dysfunction present (HCC)  A41.9     4. Hyponatremia  E87.1     5. Other closed fracture of first lumbar vertebra, initial encounter [facility])  S32.018A       ED Discharge Orders     None       Discharge Instructions   None     Elmer Sow. Pilar Plate, MD [facility] Emergency Medicine [facility] [email or web address]    Sabas Sous, MD 12/10/22 [phone]

## 2022-12-10 ENCOUNTER — Inpatient Hospital Stay (HOSPITAL_COMMUNITY): Payer: Medicare HMO

## 2022-12-10 ENCOUNTER — Telehealth (HOSPITAL_COMMUNITY): Payer: Self-pay | Admitting: Physician Assistant

## 2022-12-10 ENCOUNTER — Other Ambulatory Visit (HOSPITAL_COMMUNITY): Payer: Self-pay | Admitting: *Deleted

## 2022-12-10 DIAGNOSIS — R7989 Other specified abnormal findings of blood chemistry: Secondary | ICD-10-CM | POA: Diagnosis not present

## 2022-12-10 DIAGNOSIS — W19XXXA Unspecified fall, initial encounter: Secondary | ICD-10-CM

## 2022-12-10 DIAGNOSIS — R6510 Systemic inflammatory response syndrome (SIRS) of non-infectious origin without acute organ dysfunction: Secondary | ICD-10-CM

## 2022-12-10 DIAGNOSIS — E782 Mixed hyperlipidemia: Secondary | ICD-10-CM

## 2022-12-10 DIAGNOSIS — Y92009 Unspecified place in unspecified non-institutional (private) residence as the place of occurrence of the external cause: Secondary | ICD-10-CM

## 2022-12-10 DIAGNOSIS — E039 Hypothyroidism, unspecified: Secondary | ICD-10-CM

## 2022-12-10 DIAGNOSIS — I4819 Other persistent atrial fibrillation: Secondary | ICD-10-CM

## 2022-12-10 DIAGNOSIS — R509 Fever, unspecified: Secondary | ICD-10-CM

## 2022-12-10 DIAGNOSIS — D509 Iron deficiency anemia, unspecified: Secondary | ICD-10-CM

## 2022-12-10 DIAGNOSIS — I10 Essential (primary) hypertension: Secondary | ICD-10-CM | POA: Insufficient documentation

## 2022-12-10 DIAGNOSIS — E871 Hypo-osmolality and hyponatremia: Secondary | ICD-10-CM

## 2022-12-10 DIAGNOSIS — K5900 Constipation, unspecified: Secondary | ICD-10-CM

## 2022-12-10 LAB — COMPREHENSIVE METABOLIC PANEL
ALT: 12 U/L (ref 0–44)
AST: 13 U/L — ABNORMAL LOW (ref 15–41)
Albumin: 2.3 g/dL — ABNORMAL LOW (ref 3.5–5.0)
Alkaline Phosphatase: 75 U/L (ref 38–126)
Anion gap: 6 (ref 5–15)
BUN: 13 mg/dL (ref 8–23)
CO2: 23 mmol/L (ref 22–32)
Calcium: 8.3 mg/dL — ABNORMAL LOW (ref 8.9–10.3)
Chloride: 97 mmol/L — ABNORMAL LOW (ref 98–111)
Creatinine, Ser: 0.73 mg/dL (ref 0.61–1.24)
GFR, Estimated: >60 mL/min (ref >60)
Glucose, Bld: 110 mg/dL — ABNORMAL HIGH (ref 70–99)
Potassium: 3.5 mmol/L (ref 3.5–5.1)
Sodium: 126 mmol/L — ABNORMAL LOW (ref 135–145)
Total Bilirubin: 1 mg/dL (ref 0.3–1.2)
Total Protein: 7 g/dL (ref 6.5–8.1)

## 2022-12-10 LAB — BLOOD CULTURE ID PANEL (REFLEXED) - BCID2

## 2022-12-10 LAB — CBC
HCT: 30.5 % — ABNORMAL LOW (ref 39.0–52.0)
Hemoglobin: 9.8 g/dL — ABNORMAL LOW (ref 13.0–17.0)
MCH: 24.1 pg — ABNORMAL LOW (ref 26.0–34.0)
MCHC: 32.1 g/dL (ref 30.0–36.0)
MCV: 75.1 fL — ABNORMAL LOW (ref 80.0–100.0)
Platelets: 271 10*3/uL (ref 150–400)
RBC: 4.06 MIL/uL — ABNORMAL LOW (ref 4.22–5.81)
RDW: 15.9 % — ABNORMAL HIGH (ref 11.5–15.5)
WBC: 10.6 10*3/uL — ABNORMAL HIGH (ref 4.0–10.5)
nRBC: 0 % (ref 0.0–0.2)

## 2022-12-10 LAB — PHOSPHORUS: Phosphorus: 4.1 mg/dL (ref 2.5–4.6)

## 2022-12-10 LAB — IRON AND TIBC
Iron: 17 ug/dL — ABNORMAL LOW (ref 45–182)
Saturation Ratios: 9 % — ABNORMAL LOW (ref 17.9–39.5)
TIBC: 185 ug/dL — ABNORMAL LOW (ref 250–450)
UIBC: 168 ug/dL

## 2022-12-10 LAB — LACTIC ACID, PLASMA: Lactic Acid, Venous: 1.1 mmol/L (ref 0.5–1.9)

## 2022-12-10 LAB — OSMOLALITY: Osmolality: 278 mOsm/kg (ref 275–295)

## 2022-12-10 LAB — FERRITIN: Ferritin: 294 ng/mL (ref 24–336)

## 2022-12-10 LAB — MAGNESIUM: Magnesium: 1.9 mg/dL (ref 1.7–2.4)

## 2022-12-10 MED ORDER — ONDANSETRON HCL 4 MG PO TABS: 4.0000 mg | ORAL_TABLET | Freq: Four times a day (QID) | ORAL | Status: DC | PRN

## 2022-12-10 MED ORDER — POLYETHYLENE GLYCOL 3350 17 G PO PACK: 17.0000 g | PACK | Freq: Every day | ORAL | Status: DC

## 2022-12-10 MED ORDER — ONDANSETRON HCL 4 MG/2ML IJ SOLN: 4.0000 mg | Freq: Four times a day (QID) | INTRAMUSCULAR | Status: DC | PRN

## 2022-12-10 MED ORDER — ACETAMINOPHEN 325 MG PO TABS
650.0000 mg | ORAL_TABLET | Freq: Four times a day (QID) | ORAL | Status: DC | PRN
  Administered 2022-12-10: 650 mg via ORAL
  Filled 2022-12-10: qty 2

## 2022-12-10 MED ORDER — ACETAMINOPHEN 650 MG RE SUPP: 650.0000 mg | Freq: Four times a day (QID) | RECTAL | Status: DC | PRN

## 2022-12-10 MED ORDER — SODIUM CHLORIDE 0.9 % IV SOLN
2.0000 g | Freq: Three times a day (TID) | INTRAVENOUS | Status: DC
  Administered 2022-12-10: 2 g via INTRAVENOUS
  Filled 2022-12-10: qty 12.5

## 2022-12-10 MED ORDER — VANCOMYCIN HCL IN DEXTROSE 1-5 GM/200ML-% IV SOLN: 1000.0000 mg | Freq: Two times a day (BID) | INTRAVENOUS | Status: DC

## 2022-12-10 NOTE — Care Management CC44 (Signed)
Condition Code 44 Documentation Completed  Patient Details  Name: [name] MRN: [id number] Date of Birth: April 09, 1951   Condition Code 44 given:  Yes Patient signature on Condition Code 44 notice:  Yes Documentation of 2 MD's agreement:  Yes Code 44 added to claim:  Yes    Villa Herb, LCSWA 12/10/2022, 10:48 AM

## 2022-12-10 NOTE — H&P (View-Only) (Signed)
History and Physical    Patient: [name] ZOX:[id number] DOB: [birth year] DOA: 12/09/2022 DOS: the patient was seen and examined on 12/10/2022 PCP: [name], MD  Patient coming from: Home  Chief Complaint:  Chief Complaint  Patient presents with   Fall   HPI: [name] is a 72 y.o. male with medical history significant of COPD, persistent atrial fibrillation on Eliquis, hypertension, hyperlipidemia, status post TAVR (March 2021) who presents to the emergency department after sustaining a fall at home.  Patient states that he stumbled over his walker and fell.  He states that he hit his head and sustained hematoma to right forehead with no loss of consciousness, back pain, neck pain.  ED Course:  In the emergency department, patient was noted to be febrile with a temperature of 101.81F, BP was 170/93 and other vital signs are within normal range.  Workup in the ED showed WBC 15.4, hemoglobin 10.7, hematocrit 33.2, MCV 74.8, platelets 318.  BMP showed sodium of 125, potassium 4.0, chloride 94, bicarb 20, blood glucose 109, BUN 14, creatinine 0.81, albumin 2.8.  BNP 290.  Urinalysis was normal, lactic acid 1.0.  Influenza A, B, SARS coronavirus 2, RSV was negative, blood culture pending CT chest abdomen and pelvis showed no acute cardiopulmonary disease but showed large stool burden throughout the colon compatible with constipation.  Diffuse gaseous distention of the colon may reflect ileus. CT head without contrast showed right frontal scalp hematoma.  No acute intracranial abnormality.  No skull fracture. Chest x-ray showed no active cardiopulmonary disease Patient was treated with cefepime, metronidazole and vancomycin.  IV hydration was provided, Tylenol was given. Hospitalist was asked to admit patient for further evaluation and management.  Review of Systems: Review of systems as noted in the HPI. All other systems reviewed and are negative.   Past Medical History:   Diagnosis Date   Anemia    Asthma    Back pain, chronic, followed at pain clinic 11/25/2011   Carotid artery stenosis 09/06/2014   By Dopplers in September 2017: Right ICA 40-59%.  Less than 40% left ICA.   Colon polyps    COPD (chronic obstructive pulmonary disease) (HCC)    Dementia (HCC)    Mild   Depression    Dyslipidemia (high LDL; low HDL)    statin intolerant; on fibrate   Edema leg from Venous Stasis    Venous stasis :wears compression stockings; 08/2012 dopplers - no DVT or thrombophlebitis; mild R Popliteal V reflux - no VNUS ablation candidate   GERD (gastroesophageal reflux disease)    Hearing loss    History of CVA (cerebrovascular accident)    History of diabetes mellitus    History of DVT (deep vein thrombosis)    Hypertension    very labile   Nephrolithiasis    Osteoarthritis    Other idiopathic peripheral autonomic neuropathy    Agent orange   PAF (paroxysmal atrial fibrillation) (HCC)    PONV (postoperative nausea and vomiting)    PTSD (post-traumatic stress disorder) 07/24/2015   per patient approach from foot of bed to awake; do not apply any contricting pressure, also avoid approaching from behind with any loud noises    S/P TAVR (transcatheter aortic valve replacement) 08/08/2019   s/p TAVR w/ a 29 mm Edwards Sapien 3 THV via the TF approach by Drs Excell Seltzer and Cornelius Moras   Seizure [facility])    Severe aortic stenosis    Skull fracture (HCC)    Small bowel  obstruction (HCC) 1990s, 2001, 2015   s/p multiple bowel surgeries; from war wounds   Past Surgical History:  Procedure Laterality Date   APPENDECTOMY  1958   per patient   Carotid Dopplers  01/2016   Stable. RICA - slight progression from<40% to 40-59%.  Otherwise stable bilateral carotids and subclavian arteries. Stable LICA   COLONOSCOPY     DENTAL SURGERY  01/14/2017   5 extractions in preparation for LTKA on 01-25-17; also was started on amoxicillin x 7days; has since completed therapy    exploratory  laparotomy with extensive lysis of adhesions  10/1999   JOINT REPLACEMENT     right knee replaced 2x   LEFT HEART CATHETERIZATION WITH CORONARY ANGIOGRAM N/A 11/26/2011   WNL Runell Gess, MD   NM MYOVIEW LTD  08/2011   Negative lexiscan myoview: No ischemia or infarction   RIGHT/LEFT HEART CATH AND CORONARY ANGIOGRAPHY N/A 10/22/2016   Procedure: Right/Left Heart Cath and Coronary Angiography;  Surgeon: Marykay Lex, MD;  Location: [facility] INVASIVE CV LAB:: Angiographically normal/minimal CAD with tortuous coronary arteries.  Mild pulmonary pretension secondary to severely elevated LVEDP and systemic hypertension.  Mild-moderate aortic valve stenosis with mean gradient 21 mmHg.   RIGHT/LEFT HEART CATH AND CORONARY ANGIOGRAPHY N/A 07/13/2019   Procedure: RIGHT/LEFT HEART CATH AND CORONARY ANGIOGRAPHY;  Surgeon: Marykay Lex, MD;  Location: [facility] INVASIVE CV LAB;  Service: Cardiovascular;; : Angiographically normal coronary arteries.  Hemodynamic findings consistent with severe stenosis.  Normal RHC Pressures.   small bowel obstruction  1996, 1999, 2001, 2015   TEE WITHOUT CARDIOVERSION N/A 08/08/2019   Procedure: TRANSESOPHAGEAL ECHOCARDIOGRAM (TEE);  Surgeon: Tonny Bollman, MD;  Location: [facility] INVASIVE CV LAB;  Service: Open Heart Surgery;  Laterality: N/A;   TONSILLECTOMY  1960   per patient   TOTAL KNEE ARTHROPLASTY     TOTAL KNEE ARTHROPLASTY Left 01/25/2017   Procedure: LEFT TOTAL KNEE ARTHROPLASTY;  Surgeon: Ollen Gross, MD;  Location: [facility] ORS;  Service: Orthopedics;  Laterality: Left;  Adductor Block   TRANSCATHETER AORTIC VALVE REPLACEMENT, TRANSFEMORAL N/A 08/08/2019   Procedure: TRANSCATHETER AORTIC VALVE REPLACEMENT, TRANSFEMORAL;  Surgeon: Tonny Bollman, MD;  Location: [facility] INVASIVE CV LAB;  Service: Open Heart Surgery;  Laterality: N/A;   TRANSTHORACIC ECHOCARDIOGRAM  06/07/2019    Severe calcific aortic stenosis-mean gradient up to 49 mmHg the peak of 79 mmHg.Mild-Mod AI.   Normal LV size and function.  EF 65 to 70%.  Mild concentric LVH with GR 1 DD.  Biatrial size-normal.  Moderate mitral valve calcification with mild MAC and mild MR -> compared to June 2020-increase in both gradients from mean 42 mmHg and peak 66 mmHg   TRANSTHORACIC ECHOCARDIOGRAM  07/2019   a) post TAVR echo 08/09/2019:POD #1 - EF 60-65%, nl fxn TAVR with a mAVg of 14 mm Hg and no PVL.;;  09/07/2019:  o 1 month echo showed EF 65%, normally functioning TAVR with a mean gradient of 13 mm Hg and no PVL.   TRANSTHORACIC ECHOCARDIOGRAM  09/04/2020   1 year post TAVR:  EF 70 to 75%.  Hyperdynamic LV function.  GR 1 DD-mild LA dilation.[name]  No RWMA.  Normal RV function.  Unable to assess PAP-mildly elevated RAP.  TAVR valve in place.  Mean gradient 11 mmHg no PVL.  Mild ascending aortic dilation.  No change from previous echo   WRIST FRACTURE SURGERY      Social History:  reports that he has never smoked. He has quit using smokeless  tobacco. He reports current alcohol use. He reports that he does not use drugs.   Allergies  Allergen Reactions   Bee Venom Anaphylaxis   Bystolic [Nebivolol Hcl] Other (See Comments)    Nightmares, flashbacks (PTSD)   Codeine Shortness Of Breath and Rash   Doxycycline Shortness Of Breath and Rash   Iodinated Contrast Media Shortness Of Breath and Anaphylaxis    Lost consciousness/difficulty breathing Omni - Paque Contrast    Iohexol Hives, Shortness Of Breath and Rash    Neck and Torso erythemia IVP-DYE >> SHORTNESS OF BREATH    Levaquin [Levofloxacin] Hives and Shortness Of Breath   Oxycodone Hcl Shortness Of Breath, Swelling and Rash   Stadol [Butorphanol] Shortness Of Breath   Succinylcholine Chloride Shortness Of Breath, Nausea Only, Rash and Other (See Comments)    Difficulty breathing    Tamiflu [Oseltamivir Phosphate]     UNSPECIFIED REACTION    Atorvastatin     myalgias   Synvisc [Hylan G-F 20] Hives and Rash   Gemfibrozil     Other reaction(s): Serum  cholesterol raised   Latex Other (See Comments)    blister   Lisinopril     Other reaction(s): Cough   Omeprazole    Dilaudid [Hydromorphone Hcl] Nausea Only and Rash    Aggressive    Methocarbamol Nausea Only and Rash   Niacin Rash   Pentazocine Lactate Rash    rash    Family History  Problem Relation Age of Onset   Stroke Mother    Heart disease Mother    Heart attack Mother    Heart disease Father    Heart attack Father    Heart attack Sister    Heart attack Brother    Hypertension Brother    Heart attack Son    Stroke Son    Hypertension Brother    Sleep apnea Son    Non-Hodgkin's lymphoma Daughter      Prior to Admission medications   Medication Sig Start Date End Date Taking? Authorizing Provider  albuterol (ACCUNEB) 0.63 MG/3ML nebulizer solution Take 1 ampule by nebulization every 6 (six) hours as needed for wheezing or shortness of breath.     [provider]  ALPRAZolam Prudy Feeler) 1 MG tablet Take 1 tablet (1 mg total) by mouth 3 (three) times daily as needed for anxiety or sleep. 01/22/14   Dorothea Ogle, MD  apixaban (ELIQUIS) 5 MG TABS tablet Take 5 mg by mouth 2 (two) times daily.    [provider]  azithromycin (ZITHROMAX Z-PAK) 250 MG tablet Take as directed Patient not taking: Reported on 02/07/2022 07/07/21   Waymon Budge, MD  beclomethasone (BECONASE-AQ) 42 MCG/SPRAY nasal spray Place 1 spray into both nostrils daily.     [provider]  budesonide-formoterol (SYMBICORT) 160-4.5 MCG/ACT inhaler INHALE 2 PUFFS INTO THE LUNGS TWICE A DAY RINSE MOUTH WELL AFTER USE Patient taking differently: Inhale 2 puffs into the lungs daily. 02/09/19   Jetty Duhamel D, MD  carvedilol (COREG) 25 MG tablet Take 1 tablet (25 mg total) by mouth 2 (two) times daily. 06/15/18 02/07/22  Marykay Lex, MD  Cholecalciferol (VITAMIN D-3) 125 MCG (5000 UT) TABS Take 5,000 Units by mouth daily.    [provider]  cyclobenzaprine (FLEXERIL) 10 MG  tablet Take 5 mg by mouth daily as needed for muscle spasms. 02/29/20   [provider]  EPINEPHrine 0.3 mg/0.3 mL IJ SOAJ injection Inject 0.3 mg into the muscle once as needed  for anaphylaxis. 09/09/21   [provider]  fluticasone-salmeterol (ADVAIR) 250-50 MCG/ACT AEPB Inhale 1 puff into the lungs in the morning and at bedtime. 09/09/21   [provider]  furosemide (LASIX) 20 MG tablet Take 1 tablet (20 mg total) by mouth 2 (two) times daily. As directed Patient taking differently: Take 20 mg by mouth daily as needed for fluid. 09/01/19   Marykay Lex, MD  hyoscyamine (LEVSIN) 0.125 MG tablet Take 1 tablet by mouth 3 (three) times daily as needed for bladder spasms. 02/13/14   [provider]  influenza vaccine adjuvanted (FLUAD QUADRIVALENT) 0.5 ML injection Inject into the muscle. Patient not taking: Reported on 01/26/2022 05/07/21   Judyann Munson, MD  irbesartan (AVAPRO) 300 MG tablet TAKE 1 TABLET BY MOUTH EVERY DAY Patient not taking: Reported on 02/07/2022 11/28/19   Marykay Lex, MD  levothyroxine (SYNTHROID) 25 MCG tablet Take 25 mcg by mouth daily. 02/16/20   [provider]  losartan (COZAAR) 100 MG tablet Take 100 mg by mouth daily. 09/09/21   [provider]  Magnesium Hydroxide (MILK OF MAGNESIA PO) Take 1 Dose by mouth as needed (constipation). 03/25/17   [provider]  morphine (MSIR) 15 MG tablet Take 15 mg by mouth every 4 (four) hours as needed for severe pain. 09/25/17   [provider]  Multiple Vitamin (MULTIVITAMIN WITH MINERALS) TABS tablet Take 1 tablet by mouth at bedtime.    [provider]  nitroGLYCERIN (NITROSTAT) 0.4 MG SL tablet Place 1 tablet (0.4 mg total) under the tongue every 5 (five) minutes as needed for chest pain. 08/07/19   Janetta Hora, PA-C  ondansetron (ZOFRAN-ODT) 8 MG disintegrating tablet Take 1 tablet (8 mg total) by mouth every 8 (eight) hours as needed for  nausea. 02/07/22   Derwood Kaplan, MD  predniSONE (DELTASONE) 10 MG tablet Take 4 tabs for 2 days, then 3 tabs for 2 days, 2 tabs for 2 days, then 1 tab for 2 days, then stop. Patient not taking: Reported on 02/07/2022 06/23/21   Waymon Budge, MD  promethazine (PHENERGAN) 25 MG tablet Take 25 mg by mouth 4 (four) times daily as needed for nausea or vomiting. 03/25/17   [provider]  Propylene Glycol (SYSTANE COMPLETE OP) Place 1 drop into both eyes daily as needed (dry eyes).    [provider]  Respiratory Therapy Supplies (FLUTTER) DEVI Use as directed 01/10/15   Jetty Duhamel D, MD  rosuvastatin (CRESTOR) 40 MG tablet Take 40 mg by mouth daily.    [provider]  sertraline (ZOLOFT) 100 MG tablet Take 150 mg by mouth daily. 09/09/21   [provider]  spironolactone (ALDACTONE) 25 MG tablet TAKE 1/2 TABLET BY MOUTH EVERY DAY Patient taking differently: Take 25 mg by mouth daily. 08/23/20   Tonny Bollman, MD  SYMPROIC 0.2 MG TABS Take 0.2 mg by mouth daily as needed (constipation.).    [provider]    Physical Exam: BP 120/70   Pulse 67   Temp 98.1 F (36.7 C) (Oral)   Resp 12   Ht 6\' 1"  (1.854 m)   Wt 110.2 kg   SpO2 98%   BMI 32.05 kg/m   General: 72 y.o. year-old male well developed well nourished in no acute distress.  Alert and oriented x3. HEENT:  EOMI, mild abrasion with right forehead hematoma Neck: Supple, trachea medial Cardiovascular: Regular rate and rhythm with no rubs or gallops.  No thyromegaly or  JVD noted.  Bilateral ankle edema. 2/4 pulses in all 4 extremities. Respiratory: Clear to auscultation with no wheezes or rales. Good inspiratory effort. Abdomen: Soft, nontender nondistended with normal bowel sounds x4 quadrants. Muskuloskeletal: No cyanosis, clubbing or edema noted bilaterally Neuro: CN II-XII intact, strength 5/5 x 4, sensation, reflexes intact Skin: Skin warm and dry Psychiatry: Judgement and insight  appear normal. Mood is appropriate for condition and setting          Labs on Admission:  Basic Metabolic Panel: Recent Labs  Lab 12/09/22 2056 12/10/22 0623  NA 125* 126*  K 4.0 3.5  CL 94* 97*  CO2 20* 23  GLUCOSE 109* 110*  BUN 14 13  CREATININE 0.81 0.73  CALCIUM 8.7* 8.3*  MG  --  1.9  PHOS  --  4.1   Liver Function Tests: Recent Labs  Lab 12/09/22 2056 12/10/22 0623  AST 18 13*  ALT 15 12  ALKPHOS 91 75  BILITOT 1.0 1.0  PROT 8.0 7.0  ALBUMIN 2.8* 2.3*   No results for input(s): "LIPASE", "AMYLASE" in the last 168 hours. No results for input(s): "AMMONIA" in the last 168 hours. CBC: Recent Labs  Lab 12/09/22 2056 12/10/22 0623  WBC 15.4* 10.6*  NEUTROABS 13.1*  --   HGB 10.7* 9.8*  HCT 33.2* 30.5*  MCV 74.8* 75.1*  PLT 318 271   Cardiac Enzymes: No results for input(s): "CKTOTAL", "CKMB", "CKMBINDEX", "TROPONINI" in the last 168 hours.  BNP (last 3 results) Recent Labs    12/09/22 2125  BNP 290.0*    ProBNP (last 3 results) No results for input(s): "PROBNP" in the last 8760 hours.  CBG: No results for input(s): "GLUCAP" in the last 168 hours.  Radiological Exams on Admission: CT CHEST ABDOMEN PELVIS WO CONTRAST  Result Date: 12/09/2022 CLINICAL DATA:  Pneumonia, complication suspected, xray done.  Fall. EXAM: CT CHEST, ABDOMEN AND PELVIS WITHOUT CONTRAST TECHNIQUE: Multidetector CT imaging of the chest, abdomen and pelvis was performed following the standard protocol without IV contrast. RADIATION DOSE REDUCTION: This exam was performed according to the departmental dose-optimization program which includes automated exposure control, adjustment of the mA and/or kV according to patient size and/or use of iterative reconstruction technique. COMPARISON:  09/20/2020 FINDINGS: CT CHEST FINDINGS Cardiovascular: Heart is normal size. Aorta is normal caliber. Prior TAVR coronary artery and aortic atherosclerosis. Mediastinum/Nodes: No mediastinal,  hilar, or axillary adenopathy. Trachea and esophagus are unremarkable. Thyroid unremarkable. Lungs/Pleura: Lungs are clear. No focal airspace opacities or suspicious nodules. No effusions. Musculoskeletal: Chest wall soft tissues are unremarkable. No acute bony abnormality. CT ABDOMEN PELVIS FINDINGS Hepatobiliary: No focal liver abnormality is seen. Status post cholecystectomy. No biliary dilatation. Pancreas: No focal abnormality or ductal dilatation. Spleen: No focal abnormality.  Normal size. Adrenals/Urinary Tract: No adrenal abnormality. No focal renal abnormality. No stones or hydronephrosis. Urinary bladder is unremarkable. Stomach/Bowel: Large stool burden throughout the colon suggesting constipation. Gaseous distention of the colon may reflect ileus. Stomach and small bowel decompressed, unremarkable. Vascular/Lymphatic: Aortic atherosclerosis. No evidence of aneurysm or adenopathy. Reproductive: Insert male pelvis Other: No free fluid or free air. Musculoskeletal: Mild compression fracture through the superior endplate of L1. This is new since 2022. IMPRESSION: Large stool burden throughout the colon compatible with constipation. Diffuse gaseous distention of the colon may reflect ileus. No acute cardiopulmonary disease. Mild compression fracture through the superior endplate of L1, age indeterminate but new since 2022. Coronary artery disease, aortic atherosclerosis. Electronically Signed   By: Caryn Bee  Dover M.D.   On: 12/09/2022 23:10   CT Head Wo Contrast  Result Date: 12/09/2022 CLINICAL DATA:  Fall hitting head on floor. Right forehead hematoma. EXAM: CT HEAD WITHOUT CONTRAST TECHNIQUE: Contiguous axial images were obtained from the base of the skull through the vertex without intravenous contrast. RADIATION DOSE REDUCTION: This exam was performed according to the departmental dose-optimization program which includes automated exposure control, adjustment of the mA and/or kV according to patient  size and/or use of iterative reconstruction technique. COMPARISON:  Head CT 02/07/2022 FINDINGS: Brain: No intracranial hemorrhage, mass effect, or midline shift. Generalized atrophy with mild progression from prior. No hydrocephalus. The basilar cisterns are patent. Minimal periventricular chronic small vessel ischemic change. Encephalomalacia in the posteroinferior left frontal lobe is new from prior exam, but likely represents interval ischemia. No evidence of territorial infarct or acute ischemia. No extra-axial or intracranial fluid collection. Vascular: Atherosclerosis of skullbase vasculature without hyperdense vessel or abnormal calcification. Skull: No fracture or focal lesion. Sinuses/Orbits: Mucosal thickening throughout the ethmoid air cells, chronic. No acute findings. Other: Right frontal scalp hematoma. IMPRESSION: 1. Right frontal scalp hematoma. No acute intracranial abnormality. No skull fracture. 2. Generalized atrophy and chronic small vessel ischemic change. Encephalomalacia in the posteroinferior left frontal lobe is new from September 2023 exam, likely represents interval ischemia. Electronically Signed   By: Narda Rutherford M.D.   On: 12/09/2022 22:21   DG Chest 2 View  Result Date: 12/09/2022 CLINICAL DATA:  Suspected sepsis. EXAM: CHEST - 2 VIEW COMPARISON:  Chest radiograph dated 02/07/2022. FINDINGS: No focal consolidation, pleural effusion, or pneumothorax. Top-normal cardiac size. Aortic valve repair. Atherosclerotic calcification of the aorta. No acute osseous pathology. IMPRESSION: No active cardiopulmonary disease. Electronically Signed   By: Elgie Collard M.D.   On: 12/09/2022 21:39    EKG: I independently viewed the EKG done and my findings are as followed: Normal sinus rhythm at a rate of 96 bpm  Assessment/Plan Present on Admission:  Fever of undetermined origin  Mixed hyperlipidemia  Constipation  Persistent atrial fibrillation (HCC)  Principal Problem:    Fever of undetermined origin Active Problems:   Persistent atrial fibrillation (HCC)   Mixed hyperlipidemia   Constipation   Fall at home, initial encounter   Hyponatremia   Elevated brain natriuretic peptide (BNP) level   Microcytic anemia   Essential hypertension   Acquired hypothyroidism   SIRS (systemic inflammatory response syndrome) (HCC)  Fever of undetermined origin SIRS Patient was febrile, though with an unknown source, he also presents with leukocytosis (met SIRS criteria) Patient was empirically started with IV vancomycin, metronidazole and cefepime, we shall continue with vancomycin and cefepime Continue Tylenol as needed  Fall at home Continue fall precaution Continue PT/OT eval and treat  Hyponatremia Continue gentle hydration Continue to monitor sodium with serial BMPs Urine osmolality, serum osmolality and urine sodium will be checked  Elevated BNP, rule out CHF BNP 290 Continue total input/output, daily weights and fluid restriction Continue heart healthy diet  Echocardiogram done on 6/ /24 showed LVEF of 60 to 65%.  No wall motion abnormalities.  Impaired relaxation with elevated filling pressures.  Echocardiogram will be done in the morning   Microcytic anemia MCV 74.8 Iron studies will be done  Constipation Continue MiraLAX  Essential hypertension Continue Coreg, losartan  Mixed hyperlipidemia Continue Crestor  Persistent atrial fibrillation Continue Coreg, Eliquis  Acquired hypothyroidism Continue Synthroid    DVT prophylaxis: Eliquis   Advance Care Planning: Full code  Consults: None  Family Communication: None at bedside  Severity of Illness: The appropriate patient status for this patient is INPATIENT. Inpatient status is judged to be reasonable and necessary in order to provide the required intensity of service to ensure the patient's safety. The patient's presenting symptoms, physical exam findings, and initial radiographic  and laboratory data in the context of their chronic comorbidities is felt to place them at high risk for further clinical deterioration. Furthermore, it is not anticipated that the patient will be medically stable for discharge from the hospital within 2 midnights of admission.   * I certify that at the point of admission it is my clinical judgment that the patient will require inpatient hospital care spanning beyond 2 midnights from the point of admission due to high intensity of service, high risk for further deterioration and high frequency of surveillance required.*  Author: Frankey Shown, DO 12/10/2022 6:51 AM  For on call review [email or web address].

## 2022-12-10 NOTE — ED Notes (Signed)
Pt refused miralax stating he can take medication at home. Discharge instructions reviewed and understanding verbalized.

## 2022-12-10 NOTE — H&P (Signed)
History and Physical    Patient: [name] UEA:[id number] DOB: [birth year] DOA: 12/09/2022 DOS: the patient was seen and examined on 12/10/2022 PCP: [name], MD  Patient coming from: {Point_of_Origin:[address]}  Chief Complaint:  Chief Complaint  Patient presents with   Fall   HPI: [name] is a 72 y.o. male with medical history significant of ***  Review of Systems: {ROS_Text:26778} Past Medical History:  Diagnosis Date   Anemia    Asthma    Back pain, chronic, followed at pain clinic 11/25/2011   Carotid artery stenosis 09/06/2014   By Dopplers in September 2017: Right ICA 40-59%.  Less than 40% left ICA.   Colon polyps    COPD (chronic obstructive pulmonary disease) (HCC)    Dementia (HCC)    Mild   Depression    Dyslipidemia (high LDL; low HDL)    statin intolerant; on fibrate   Edema leg from Venous Stasis    Venous stasis :wears compression stockings; 08/2012 dopplers - no DVT or thrombophlebitis; mild R Popliteal V reflux - no VNUS ablation candidate   GERD (gastroesophageal reflux disease)    Hearing loss    History of CVA (cerebrovascular accident)    History of diabetes mellitus    History of DVT (deep vein thrombosis)    Hypertension    very labile   Nephrolithiasis    Osteoarthritis    Other idiopathic peripheral autonomic neuropathy    Agent orange   PAF (paroxysmal atrial fibrillation) (HCC)    PONV (postoperative nausea and vomiting)    PTSD (post-traumatic stress disorder) 07/24/2015   per patient approach from foot of bed to awake; do not apply any contricting pressure, also avoid approaching from behind with any loud noises    S/P TAVR (transcatheter aortic valve replacement) 08/08/2019   s/p TAVR w/ a 29 mm Edwards Sapien 3 THV via the TF approach by Drs Excell Seltzer and Cornelius Moras   Seizure [facility])    Severe aortic stenosis    Skull fracture (HCC)    Small bowel obstruction (HCC) 1990s, 2001, 2015   s/p multiple bowel surgeries; from war wounds    Past Surgical History:  Procedure Laterality Date   APPENDECTOMY  1958   per patient   Carotid Dopplers  01/2016   Stable. RICA - slight progression from<40% to 40-59%.  Otherwise stable bilateral carotids and subclavian arteries. Stable LICA   COLONOSCOPY     DENTAL SURGERY  01/14/2017   5 extractions in preparation for LTKA on 01-25-17; also was started on amoxicillin x 7days; has since completed therapy    exploratory laparotomy with extensive lysis of adhesions  10/1999   JOINT REPLACEMENT     right knee replaced 2x   LEFT HEART CATHETERIZATION WITH CORONARY ANGIOGRAM N/A 11/26/2011   WNL Runell Gess, MD   NM MYOVIEW LTD  08/2011   Negative lexiscan myoview: No ischemia or infarction   RIGHT/LEFT HEART CATH AND CORONARY ANGIOGRAPHY N/A 10/22/2016   Procedure: Right/Left Heart Cath and Coronary Angiography;  Surgeon: Marykay Lex, MD;  Location: [facility] INVASIVE CV LAB:: Angiographically normal/minimal CAD with tortuous coronary arteries.  Mild pulmonary pretension secondary to severely elevated LVEDP and systemic hypertension.  Mild-moderate aortic valve stenosis with mean gradient 21 mmHg.   RIGHT/LEFT HEART CATH AND CORONARY ANGIOGRAPHY N/A 07/13/2019   Procedure: RIGHT/LEFT HEART CATH AND CORONARY ANGIOGRAPHY;  Surgeon: Marykay Lex, MD;  Location: [facility] INVASIVE CV LAB;  Service: Cardiovascular;; : Angiographically normal coronary arteries.  Hemodynamic findings consistent with severe stenosis.  Normal RHC Pressures.   small bowel obstruction  1996, 1999, 2001, 2015   TEE WITHOUT CARDIOVERSION N/A 08/08/2019   Procedure: TRANSESOPHAGEAL ECHOCARDIOGRAM (TEE);  Surgeon: Tonny Bollman, MD;  Location: [facility] INVASIVE CV LAB;  Service: Open Heart Surgery;  Laterality: N/A;   TONSILLECTOMY  1960   per patient   TOTAL KNEE ARTHROPLASTY     TOTAL KNEE ARTHROPLASTY Left 01/25/2017   Procedure: LEFT TOTAL KNEE ARTHROPLASTY;  Surgeon: Ollen Gross, MD;  Location: [facility] ORS;  Service:  Orthopedics;  Laterality: Left;  Adductor Block   TRANSCATHETER AORTIC VALVE REPLACEMENT, TRANSFEMORAL N/A 08/08/2019   Procedure: TRANSCATHETER AORTIC VALVE REPLACEMENT, TRANSFEMORAL;  Surgeon: Tonny Bollman, MD;  Location: [facility] INVASIVE CV LAB;  Service: Open Heart Surgery;  Laterality: N/A;   TRANSTHORACIC ECHOCARDIOGRAM  06/07/2019    Severe calcific aortic stenosis-mean gradient up to 49 mmHg the peak of 79 mmHg.Mild-Mod AI.  Normal LV size and function.  EF 65 to 70%.  Mild concentric LVH with GR 1 DD.  Biatrial size-normal.  Moderate mitral valve calcification with mild MAC and mild MR -> compared to June 2020-increase in both gradients from mean 42 mmHg and peak 66 mmHg   TRANSTHORACIC ECHOCARDIOGRAM  07/2019   a) post TAVR echo 08/09/2019:POD #1 - EF 60-65%, nl fxn TAVR with a mAVg of 14 mm Hg and no PVL.;;  09/07/2019:  o 1 month echo showed EF 65%, normally functioning TAVR with a mean gradient of 13 mm Hg and no PVL.   TRANSTHORACIC ECHOCARDIOGRAM  09/04/2020   1 year post TAVR:  EF 70 to 75%.  Hyperdynamic LV function.  GR 1 DD-mild LA dilation.[name]  No RWMA.  Normal RV function.  Unable to assess PAP-mildly elevated RAP.  TAVR valve in place.  Mean gradient 11 mmHg no PVL.  Mild ascending aortic dilation.  No change from previous echo   WRIST FRACTURE SURGERY     Social History:  reports that he has never smoked. He has quit using smokeless tobacco. He reports current alcohol use. He reports that he does not use drugs.  Allergies  Allergen Reactions   Bee Venom Anaphylaxis   Bystolic [Nebivolol Hcl] Other (See Comments)    Nightmares, flashbacks (PTSD)   Codeine Shortness Of Breath and Rash   Doxycycline Shortness Of Breath and Rash   Iodinated Contrast Media Shortness Of Breath and Anaphylaxis    Lost consciousness/difficulty breathing Omni - Paque Contrast    Iohexol Hives, Shortness Of Breath and Rash    Neck and Torso erythemia IVP-DYE >> SHORTNESS OF BREATH    Levaquin  [Levofloxacin] Hives and Shortness Of Breath   Oxycodone Hcl Shortness Of Breath, Swelling and Rash   Stadol [Butorphanol] Shortness Of Breath   Succinylcholine Chloride Shortness Of Breath, Nausea Only, Rash and Other (See Comments)    Difficulty breathing    Tamiflu [Oseltamivir Phosphate]     UNSPECIFIED REACTION    Atorvastatin     myalgias   Synvisc [Hylan G-F 20] Hives and Rash   Gemfibrozil     Other reaction(s): Serum cholesterol raised   Latex Other (See Comments)    blister   Lisinopril     Other reaction(s): Cough   Omeprazole    Dilaudid [Hydromorphone Hcl] Nausea Only and Rash    Aggressive    Methocarbamol Nausea Only and Rash   Niacin Rash   Pentazocine Lactate Rash    rash    Family History  Problem Relation Age of Onset   Stroke Mother    Heart disease Mother    Heart attack Mother    Heart disease Father    Heart attack Father    Heart attack Sister    Heart attack Brother    Hypertension Brother    Heart attack Son    Stroke Son    Hypertension Brother    Sleep apnea Son    Non-Hodgkin's lymphoma Daughter     Prior to Admission medications   Medication Sig Start Date End Date Taking? Authorizing Provider  albuterol (ACCUNEB) 0.63 MG/3ML nebulizer solution Take 1 ampule by nebulization every 6 (six) hours as needed for wheezing or shortness of breath.     [provider]  ALPRAZolam Prudy Feeler) 1 MG tablet Take 1 tablet (1 mg total) by mouth 3 (three) times daily as needed for anxiety or sleep. 01/22/14   Dorothea Ogle, MD  apixaban (ELIQUIS) 5 MG TABS tablet Take 5 mg by mouth 2 (two) times daily.    [provider]  azithromycin (ZITHROMAX Z-PAK) 250 MG tablet Take as directed Patient not taking: Reported on 02/07/2022 07/07/21   Waymon Budge, MD  beclomethasone (BECONASE-AQ) 42 MCG/SPRAY nasal spray Place 1 spray into both nostrils daily.     [provider]  budesonide-formoterol (SYMBICORT) 160-4.5 MCG/ACT inhaler  INHALE 2 PUFFS INTO THE LUNGS TWICE A DAY RINSE MOUTH WELL AFTER USE Patient taking differently: Inhale 2 puffs into the lungs daily. 02/09/19   Jetty Duhamel D, MD  carvedilol (COREG) 25 MG tablet Take 1 tablet (25 mg total) by mouth 2 (two) times daily. 06/15/18 02/07/22  Marykay Lex, MD  Cholecalciferol (VITAMIN D-3) 125 MCG (5000 UT) TABS Take 5,000 Units by mouth daily.    [provider]  cyclobenzaprine (FLEXERIL) 10 MG tablet Take 5 mg by mouth daily as needed for muscle spasms. 02/29/20   [provider]  EPINEPHrine 0.3 mg/0.3 mL IJ SOAJ injection Inject 0.3 mg into the muscle once as needed for anaphylaxis. 09/09/21   [provider]  fluticasone-salmeterol (ADVAIR) 250-50 MCG/ACT AEPB Inhale 1 puff into the lungs in the morning and at bedtime. 09/09/21   [provider]  furosemide (LASIX) 20 MG tablet Take 1 tablet (20 mg total) by mouth 2 (two) times daily. As directed Patient taking differently: Take 20 mg by mouth daily as needed for fluid. 09/01/19   Marykay Lex, MD  hyoscyamine (LEVSIN) 0.125 MG tablet Take 1 tablet by mouth 3 (three) times daily as needed for bladder spasms. 02/13/14   [provider]  influenza vaccine adjuvanted (FLUAD QUADRIVALENT) 0.5 ML injection Inject into the muscle. Patient not taking: Reported on 01/26/2022 05/07/21   Judyann Munson, MD  irbesartan (AVAPRO) 300 MG tablet TAKE 1 TABLET BY MOUTH EVERY DAY Patient not taking: Reported on 02/07/2022 11/28/19   Marykay Lex, MD  levothyroxine (SYNTHROID) 25 MCG tablet Take 25 mcg by mouth daily. 02/16/20   [provider]  losartan (COZAAR) 100 MG tablet Take 100 mg by mouth daily. 09/09/21   [provider]  Magnesium Hydroxide (MILK OF MAGNESIA PO) Take 1 Dose by mouth as needed (constipation). 03/25/17   [provider]  morphine (MSIR) 15 MG tablet Take 15 mg by mouth every 4 (four) hours as needed for severe pain. 09/25/17   [provider]  Multiple Vitamin (MULTIVITAMIN WITH MINERALS) TABS tablet Take 1 tablet by mouth at bedtime.  [provider]  nitroGLYCERIN (NITROSTAT) 0.4 MG SL tablet Place 1 tablet (0.4 mg total) under the tongue every 5 (five) minutes as needed for chest pain. 08/07/19   Janetta Hora, PA-C  ondansetron (ZOFRAN-ODT) 8 MG disintegrating tablet Take 1 tablet (8 mg total) by mouth every 8 (eight) hours as needed for nausea. 02/07/22   Derwood Kaplan, MD  predniSONE (DELTASONE) 10 MG tablet Take 4 tabs for 2 days, then 3 tabs for 2 days, 2 tabs for 2 days, then 1 tab for 2 days, then stop. Patient not taking: Reported on 02/07/2022 06/23/21   Waymon Budge, MD  promethazine (PHENERGAN) 25 MG tablet Take 25 mg by mouth 4 (four) times daily as needed for nausea or vomiting. 03/25/17   [provider]  Propylene Glycol (SYSTANE COMPLETE OP) Place 1 drop into both eyes daily as needed (dry eyes).    [provider]  Respiratory Therapy Supplies (FLUTTER) DEVI Use as directed 01/10/15   Jetty Duhamel D, MD  rosuvastatin (CRESTOR) 40 MG tablet Take 40 mg by mouth daily.    [provider]  sertraline (ZOLOFT) 100 MG tablet Take 150 mg by mouth daily. 09/09/21   [provider]  spironolactone (ALDACTONE) 25 MG tablet TAKE 1/2 TABLET BY MOUTH EVERY DAY Patient taking differently: Take 25 mg by mouth daily. 08/23/20   Tonny Bollman, MD  SYMPROIC 0.2 MG TABS Take 0.2 mg by mouth daily as needed (constipation.).    [provider]    Physical Exam: Vitals:   12/09/22 2147 12/09/22 2245 12/09/22 2330 12/09/22 2345  BP: (!) 158/100 (!) 155/97 (!) 160/92 (!) 155/91  Pulse: 94 95 85 83  Resp: 13 11 14 16   Temp:      TempSrc:      SpO2: 97% 100% 100% 100%  Weight:      Height:       *** Data Reviewed: {Tip this will not be part of the note when signed- Document your independent interpretation of telemetry tracing, EKG, lab, Radiology test or  any other diagnostic tests. Add any new diagnostic test ordered today. (Optional):26781} {Results:26384}  Assessment and Plan: No notes have been filed under this hospital service. Service: Hospitalist     Advance Care Planning:   Code Status: Prior ***  Consults: ***  Family Communication: ***  Severity of Illness: {Observation/Inpatient:21159}  Author: Frankey Shown, DO 12/10/2022 12:09 AM  For on call review [email or web address].

## 2022-12-10 NOTE — Discharge Summary (Signed)
Physician Discharge Summary  [name] GEX:[id number] DOB: March 14, 1951 DOA: 12/09/2022  PCP: [name], MD  Admit date: 12/09/2022 Discharge date: 12/10/2022  Admitted From: Home Disposition: Home  Recommendations for Outpatient Follow-up:  Follow up with PCP in 1-2 weeks Repeat labs and imaging per PCP  Home Health: None Equipment/Devices: None  Discharge Condition: Stable CODE STATUS: Full Diet recommendation: Low-salt low-fat low-carb diet  Brief/Interim Summary: [name] is a 72 y.o. male with medical history significant of COPD, persistent atrial fibrillation on Eliquis, hypertension, hyperlipidemia, status post TAVR (March 2021) who presents to the emergency department after sustaining a fall at home.  Patient states that he stumbled over his walker and fell.  He states that he hit his head and sustained hematoma to right forehead with no loss of consciousness, back pain, neck pain.   Patient admitted as above after mechanical fall where he struck his head, while on Eliquis he was concerned for possible internal bleeding and presented to the ED for further evaluation and treatment.  Incidentally noted low-grade fever and leukocytosis likely consistent with trauma.  Patient was initially admitted for continued workup for SIRS criteria to rule out sepsis however given patient is at baseline with no complaints with negative imaging and lab work we will discharge patient home at this time for further outpatient workup with PCP as appropriate.  No further indication for imaging testing via labs or otherwise at this time.  Discharge Diagnoses:  Principal Problem:   Fever of undetermined origin Active Problems:   Persistent atrial fibrillation (HCC)   Mixed hyperlipidemia   Constipation   Fall at home, initial encounter   Hyponatremia   Elevated brain natriuretic peptide (BNP) level   Microcytic anemia   Essential hypertension   Acquired hypothyroidism   SIRS  (systemic inflammatory response syndrome) (HCC)    Discharge Instructions   Allergies as of 12/10/2022       Reactions   Bee Venom Anaphylaxis   Bystolic [nebivolol Hcl] Other (See Comments)   Nightmares, flashbacks (PTSD)   Codeine Shortness Of Breath, Rash   Doxycycline Shortness Of Breath, Rash   Iodinated Contrast Media Shortness Of Breath, Anaphylaxis   Lost consciousness/difficulty breathing Omni - Paque Contrast    Iohexol Hives, Shortness Of Breath, Rash   Neck and Torso erythemia IVP-DYE >> SHORTNESS OF BREATH   Levaquin [levofloxacin] Hives, Shortness Of Breath   Oxycodone Hcl Shortness Of Breath, Swelling, Rash   Stadol [butorphanol] Shortness Of Breath   Succinylcholine Chloride Shortness Of Breath, Nausea Only, Rash, Other (See Comments)   Difficulty breathing   Tamiflu [oseltamivir Phosphate]    UNSPECIFIED REACTION    Atorvastatin    myalgias   Synvisc [hylan G-f 20] Hives, Rash   Gemfibrozil    Other reaction(s): Serum cholesterol raised   Latex Other (See Comments)   blister   Lisinopril    Other reaction(s): Cough   Omeprazole    Dilaudid [hydromorphone Hcl] Nausea Only, Rash   Aggressive   Methocarbamol Nausea Only, Rash   Niacin Rash   Pentazocine Lactate Rash   rash        Medication List     STOP taking these medications    Fluad Quadrivalent 0.5 ML injection Generic drug: influenza vaccine adjuvanted       TAKE these medications    albuterol 0.63 MG/3ML nebulizer solution Commonly known as: ACCUNEB Take 1 ampule by nebulization every 6 (six) hours as needed for wheezing or shortness of breath.  ALPRAZolam 1 MG tablet Commonly known as: XANAX Take 1 tablet (1 mg total) by mouth 3 (three) times daily as needed for anxiety or sleep.   apixaban 5 MG Tabs tablet Commonly known as: ELIQUIS Take 5 mg by mouth 2 (two) times daily.   beclomethasone 42 MCG/SPRAY nasal spray Commonly known as: BECONASE-AQ Place 1 spray into both  nostrils daily.   budesonide-formoterol 160-4.5 MCG/ACT inhaler Commonly known as: SYMBICORT INHALE 2 PUFFS INTO THE LUNGS TWICE A DAY RINSE MOUTH WELL AFTER USE What changed:  how much to take how to take this when to take this additional instructions   carvedilol 25 MG tablet Commonly known as: COREG Take 1 tablet (25 mg total) by mouth 2 (two) times daily.   cyclobenzaprine 10 MG tablet Commonly known as: FLEXERIL Take 5 mg by mouth daily as needed for muscle spasms.   EPINEPHrine 0.3 mg/0.3 mL Soaj injection Commonly known as: EPI-PEN Inject 0.3 mg into the muscle once as needed for anaphylaxis.   fluticasone-salmeterol 250-50 MCG/ACT Aepb Commonly known as: ADVAIR Inhale 1 puff into the lungs in the morning and at bedtime.   Flutter Devi Use as directed   furosemide 20 MG tablet Commonly known as: LASIX Take 1 tablet (20 mg total) by mouth 2 (two) times daily. As directed What changed:  when to take this reasons to take this additional instructions   hyoscyamine 0.125 MG tablet Commonly known as: LEVSIN Take 1 tablet by mouth 3 (three) times daily as needed for bladder spasms.   irbesartan 300 MG tablet Commonly known as: AVAPRO TAKE 1 TABLET BY MOUTH EVERY DAY   levothyroxine 25 MCG tablet Commonly known as: SYNTHROID Take 25 mcg by mouth daily.   losartan 100 MG tablet Commonly known as: COZAAR Take 100 mg by mouth daily.   MILK OF MAGNESIA PO Take 1 Dose by mouth as needed (constipation).   morphine 15 MG tablet Commonly known as: MSIR Take 15 mg by mouth every 4 (four) hours as needed for severe pain.   multivitamin with minerals Tabs tablet Take 1 tablet by mouth at bedtime.   nitroGLYCERIN 0.4 MG SL tablet Commonly known as: NITROSTAT Place 1 tablet (0.4 mg total) under the tongue every 5 (five) minutes as needed for chest pain.   ondansetron 8 MG disintegrating tablet Commonly known as: ZOFRAN-ODT Take 1 tablet (8 mg total) by mouth  every 8 (eight) hours as needed for nausea.   predniSONE 10 MG tablet Commonly known as: DELTASONE Take 4 tabs for 2 days, then 3 tabs for 2 days, 2 tabs for 2 days, then 1 tab for 2 days, then stop.   promethazine 25 MG tablet Commonly known as: PHENERGAN Take 25 mg by mouth 4 (four) times daily as needed for nausea or vomiting.   rosuvastatin 40 MG tablet Commonly known as: CRESTOR Take 40 mg by mouth daily.   sertraline 100 MG tablet Commonly known as: ZOLOFT Take 150 mg by mouth daily.   spironolactone 25 MG tablet Commonly known as: ALDACTONE TAKE 1/2 TABLET BY MOUTH EVERY DAY What changed: how much to take   Symproic 0.2 MG Tabs Generic drug: Naldemedine Tosylate Take 0.2 mg by mouth daily as needed (constipation.).   SYSTANE COMPLETE OP Place 1 drop into both eyes daily as needed (dry eyes).   Vitamin D-3 125 MCG (5000 UT) Tabs Take 5,000 Units by mouth daily.        Allergies  Allergen Reactions   Bee Venom  Anaphylaxis   Bystolic [Nebivolol Hcl] Other (See Comments)    Nightmares, flashbacks (PTSD)   Codeine Shortness Of Breath and Rash   Doxycycline Shortness Of Breath and Rash   Iodinated Contrast Media Shortness Of Breath and Anaphylaxis    Lost consciousness/difficulty breathing Omni - Paque Contrast    Iohexol Hives, Shortness Of Breath and Rash    Neck and Torso erythemia IVP-DYE >> SHORTNESS OF BREATH    Levaquin [Levofloxacin] Hives and Shortness Of Breath   Oxycodone Hcl Shortness Of Breath, Swelling and Rash   Stadol [Butorphanol] Shortness Of Breath   Succinylcholine Chloride Shortness Of Breath, Nausea Only, Rash and Other (See Comments)    Difficulty breathing    Tamiflu [Oseltamivir Phosphate]     UNSPECIFIED REACTION    Atorvastatin     myalgias   Synvisc [Hylan G-F 20] Hives and Rash   Gemfibrozil     Other reaction(s): Serum cholesterol raised   Latex Other (See Comments)    blister   Lisinopril     Other reaction(s): Cough    Omeprazole    Dilaudid [Hydromorphone Hcl] Nausea Only and Rash    Aggressive    Methocarbamol Nausea Only and Rash   Niacin Rash   Pentazocine Lactate Rash    rash    Consultations: None   Procedures/Studies: CT CHEST ABDOMEN PELVIS WO CONTRAST  Result Date: 12/09/2022 CLINICAL DATA:  Pneumonia, complication suspected, xray done.  Fall. EXAM: CT CHEST, ABDOMEN AND PELVIS WITHOUT CONTRAST TECHNIQUE: Multidetector CT imaging of the chest, abdomen and pelvis was performed following the standard protocol without IV contrast. RADIATION DOSE REDUCTION: This exam was performed according to the departmental dose-optimization program which includes automated exposure control, adjustment of the mA and/or kV according to patient size and/or use of iterative reconstruction technique. COMPARISON:  09/20/2020 FINDINGS: CT CHEST FINDINGS Cardiovascular: Heart is normal size. Aorta is normal caliber. Prior TAVR coronary artery and aortic atherosclerosis. Mediastinum/Nodes: No mediastinal, hilar, or axillary adenopathy. Trachea and esophagus are unremarkable. Thyroid unremarkable. Lungs/Pleura: Lungs are clear. No focal airspace opacities or suspicious nodules. No effusions. Musculoskeletal: Chest wall soft tissues are unremarkable. No acute bony abnormality. CT ABDOMEN PELVIS FINDINGS Hepatobiliary: No focal liver abnormality is seen. Status post cholecystectomy. No biliary dilatation. Pancreas: No focal abnormality or ductal dilatation. Spleen: No focal abnormality.  Normal size. Adrenals/Urinary Tract: No adrenal abnormality. No focal renal abnormality. No stones or hydronephrosis. Urinary bladder is unremarkable. Stomach/Bowel: Large stool burden throughout the colon suggesting constipation. Gaseous distention of the colon may reflect ileus. Stomach and small bowel decompressed, unremarkable. Vascular/Lymphatic: Aortic atherosclerosis. No evidence of aneurysm or adenopathy. Reproductive: Insert male pelvis  Other: No free fluid or free air. Musculoskeletal: Mild compression fracture through the superior endplate of L1. This is new since 2022. IMPRESSION: Large stool burden throughout the colon compatible with constipation. Diffuse gaseous distention of the colon may reflect ileus. No acute cardiopulmonary disease. Mild compression fracture through the superior endplate of L1, age indeterminate but new since 2022. Coronary artery disease, aortic atherosclerosis. Electronically Signed   By: Charlett Nose M.D.   On: 12/09/2022 23:10   CT Head Wo Contrast  Result Date: 12/09/2022 CLINICAL DATA:  Fall hitting head on floor. Right forehead hematoma. EXAM: CT HEAD WITHOUT CONTRAST TECHNIQUE: Contiguous axial images were obtained from the base of the skull through the vertex without intravenous contrast. RADIATION DOSE REDUCTION: This exam was performed according to the departmental dose-optimization program which includes automated exposure control, adjustment of the mA  and/or kV according to patient size and/or use of iterative reconstruction technique. COMPARISON:  Head CT 02/07/2022 FINDINGS: Brain: No intracranial hemorrhage, mass effect, or midline shift. Generalized atrophy with mild progression from prior. No hydrocephalus. The basilar cisterns are patent. Minimal periventricular chronic small vessel ischemic change. Encephalomalacia in the posteroinferior left frontal lobe is new from prior exam, but likely represents interval ischemia. No evidence of territorial infarct or acute ischemia. No extra-axial or intracranial fluid collection. Vascular: Atherosclerosis of skullbase vasculature without hyperdense vessel or abnormal calcification. Skull: No fracture or focal lesion. Sinuses/Orbits: Mucosal thickening throughout the ethmoid air cells, chronic. No acute findings. Other: Right frontal scalp hematoma. IMPRESSION: 1. Right frontal scalp hematoma. No acute intracranial abnormality. No skull fracture. 2.  Generalized atrophy and chronic small vessel ischemic change. Encephalomalacia in the posteroinferior left frontal lobe is new from September 2023 exam, likely represents interval ischemia. Electronically Signed   By: Narda Rutherford M.D.   On: 12/09/2022 22:21   DG Chest 2 View  Result Date: 12/09/2022 CLINICAL DATA:  Suspected sepsis. EXAM: CHEST - 2 VIEW COMPARISON:  Chest radiograph dated 02/07/2022. FINDINGS: No focal consolidation, pleural effusion, or pneumothorax. Top-normal cardiac size. Aortic valve repair. Atherosclerotic calcification of the aorta. No acute osseous pathology. IMPRESSION: No active cardiopulmonary disease. Electronically Signed   By: Elgie Collard M.D.   On: 12/09/2022 21:39   ECHOCARDIOGRAM COMPLETE  Result Date: 11/19/2022    ECHOCARDIOGRAM REPORT   Patient Name:   [name] Date of Exam: 11/19/2022 Medical Rec #:  [id number]       Height:       73.0 in Accession #:    [id number]      Weight:       243.0 lb Date of Birth:  Jan 28, 1951       BSA:          2.337 m Patient Age:    72 years        BP:           120/75 mmHg Patient Gender: M               HR:           65 bpm. Exam Location:  Outpatient Procedure: 2D Echo, Cardiac Doppler and Color Doppler Indications:    S/P Aortic Valve replacement Z95.2  History:        Patient has prior history of Echocardiogram examinations, most                 recent 09/04/2020. COPD and Stroke, Arrythmias:Atrial                 Fibrillation; Risk Factors:Dyslipidemia, Diabetes and                 Hypertension.                 Aortic Valve: 29 mm Sapien prosthetic, stented (TAVR) valve is                 present in the aortic position. Procedure Date: 08/08/19.  Sonographer:    Eulah Pont RDCS Referring Phys: Piedad Climes HARDING IMPRESSIONS  1. Left ventricular ejection fraction, by estimation, is 60 to 65%. Left ventricular ejection fraction by 2D MOD biplane is 64.8 %. The left ventricle has normal function. The left ventricle has no  regional wall motion abnormalities. There is mild left ventricular hypertrophy. Left ventricular diastolic parameters are consistent with Grade I diastolic dysfunction (impaired  relaxation). Elevated left ventricular end-diastolic pressure. The E/e' is 25.  2. Right ventricular systolic function is normal. The right ventricular size is normal. There is normal pulmonary artery systolic pressure. The estimated right ventricular systolic pressure is 21.7 mmHg.  3. The mitral valve is abnormal. Trivial mitral valve regurgitation.  4. The aortic valve has been repaired/replaced. Aortic valve regurgitation is not visualized. There is a 29 mm Sapien prosthetic (TAVR) valve present in the aortic position. Procedure Date: 08/08/19. Aortic valve area, by VTI measures 1.53 cm. Aortic valve mean gradient measures 20 mmHg. Aortic valve Vmax measures 3.21 m/s. Peak gradient 37 mmHg, DI is 0.43.  5. Aortic dilatation noted. There is mild dilatation of the ascending aorta, measuring 40 mm.  6. The inferior vena cava is normal in size with greater than 50% respiratory variability, suggesting right atrial pressure of 3 mmHg. Comparison(s): Changes from prior study are noted. 09/04/2020: LVEF 70-75%, grade 1 DD, 29 mm Sapien TAVR, mean gradient 11 mmHg, DI 0.6. Compared to a prior study, there has been an increase in the peak and mean gradients across the TAVR valve. FINDINGS  Left Ventricle: Left ventricular ejection fraction, by estimation, is 60 to 65%. Left ventricular ejection fraction by 2D MOD biplane is 64.8 %. The left ventricle has normal function. The left ventricle has no regional wall motion abnormalities. The left ventricular internal cavity size was normal in size. There is mild left ventricular hypertrophy. Left ventricular diastolic parameters are consistent with Grade I diastolic dysfunction (impaired relaxation). Elevated left ventricular end-diastolic pressure. The E/e' is 25. Right Ventricle: The right ventricular  size is normal. No increase in right ventricular wall thickness. Right ventricular systolic function is normal. There is normal pulmonary artery systolic pressure. The tricuspid regurgitant velocity is 2.16 m/s, and  with an assumed right atrial pressure of 3 mmHg, the estimated right ventricular systolic pressure is 21.7 mmHg. Left Atrium: Left atrial size was normal in size. Right Atrium: Right atrial size was normal in size. Pericardium: There is no evidence of pericardial effusion. Mitral Valve: The mitral valve is abnormal. Mild to moderate mitral annular calcification. Trivial mitral valve regurgitation. Tricuspid Valve: The tricuspid valve is grossly normal. Tricuspid valve regurgitation is trivial. Aortic Valve: The aortic valve has been repaired/replaced. Aortic valve regurgitation is not visualized. Aortic valve mean gradient measures 20.0 mmHg. Aortic valve peak gradient measures 37.0 mmHg. Aortic valve area, by VTI measures 1.63 cm. There is a  29 mm Sapien prosthetic, stented (TAVR) valve present in the aortic position. Procedure Date: 08/08/19. Pulmonic Valve: The pulmonic valve was normal in structure. Pulmonic valve regurgitation is not visualized. Aorta: Aortic dilatation noted. There is mild dilatation of the ascending aorta, measuring 40 mm. Venous: The inferior vena cava is normal in size with greater than 50% respiratory variability, suggesting right atrial pressure of 3 mmHg. IAS/Shunts: No atrial level shunt detected by color flow Doppler.  LEFT VENTRICLE PLAX 2D                        Biplane EF (MOD) LVIDd:         4.90 cm         LV Biplane EF:   Left LVIDs:         3.10 cm                          ventricular LV PW:  1.10 cm                          ejection LV IVS:        0.90 cm                          fraction by LVOT diam:     2.20 cm                          2D MOD LV SV:         105                              biplane is LV SV Index:   45                               64.8 %.  LVOT Area:     3.80 cm                                Diastology                                LV e' medial:    3.30 cm/s LV Volumes (MOD)               LV E/e' medial:  27.9 LV vol d, MOD    103.0 ml      LV e' lateral:   4.05 cm/s A2C:                           LV E/e' lateral: 22.7 LV vol d, MOD    153.0 ml A4C: LV vol s, MOD    36.5 ml A2C: LV vol s, MOD    57.7 ml A4C: LV SV MOD A2C:   66.5 ml LV SV MOD A4C:   153.0 ml LV SV MOD BP:    86.2 ml RIGHT VENTRICLE RV S prime:     12.50 cm/s TAPSE (M-mode): 2.3 cm LEFT ATRIUM             Index        RIGHT ATRIUM           Index LA diam:        5.50 cm 2.35 cm/m   RA Area:     14.50 cm LA Vol (A2C):   53.8 ml 23.02 ml/m  RA Volume:   33.70 ml  14.42 ml/m LA Vol (A4C):   50.1 ml 21.44 ml/m LA Biplane Vol: 51.7 ml 22.12 ml/m  AORTIC VALVE AV Area (Vmax):    1.63 cm AV Area (Vmean):   1.55 cm AV Area (VTI):     1.63 cm AV Vmax:           304.00 cm/s AV Vmean:          208.000 cm/s AV VTI:            0.642 m AV Peak Grad:      37.0 mmHg AV Mean Grad:      20.0 mmHg LVOT Vmax:         130.00 cm/s LVOT  Vmean:        85.000 cm/s LVOT VTI:          0.275 m LVOT/AV VTI ratio: 0.43  AORTA Ao Root diam: 3.40 cm Ao Asc diam:  4.00 cm MITRAL VALVE                TRICUSPID VALVE MV Area (PHT): 1.94 cm     TR Peak grad:   18.7 mmHg MV Decel Time: 391 msec     TR Vmax:        216.00 cm/s MV E velocity: 92.00 cm/s MV A velocity: 159.00 cm/s  SHUNTS MV E/A ratio:  0.58         Systemic VTI:  0.28 m                             Systemic Diam: 2.20 cm Zoila Shutter MD Electronically signed by Zoila Shutter MD Signature Date/Time: 11/19/2022/4:12:02 PM    Final      Subjective: No acute issues or events overnight   Discharge Exam: Vitals:   12/10/22 0645 12/10/22 0750  BP: 124/80   Pulse: 60   Resp: 12   Temp:  98.4 F (36.9 C)  SpO2: 98%    Vitals:   12/10/22 0615 12/10/22 0630 12/10/22 0645 12/10/22 0750  BP: 132/78 (!) 140/81 124/80   Pulse: 63 62 60    Resp: 11 14 12    Temp:    98.4 F (36.9 C)  TempSrc:    Oral  SpO2: 100% 100% 98%   Weight:      Height:       General: Pt is alert, awake, not in acute distress Cardiovascular: RRR, S1/S2 +, no rubs, no gallops Respiratory: CTA bilaterally, no wheezing, no rhonchi Abdominal: Soft, NT, ND, bowel sounds + Extremities: no edema, no cyanosis  The results of significant diagnostics from this hospitalization (including imaging, microbiology, ancillary and laboratory) are listed below for reference.     Microbiology: Recent Results (from the past 240 hour(s))  Culture, blood (Routine x 2)     Status: None (Preliminary result)   Collection Time: 12/09/22  8:56 PM   Specimen: BLOOD RIGHT FOREARM  Result Value Ref Range Status   Specimen Description BLOOD RIGHT FOREARM  Final   Special Requests   Final    BOTTLES DRAWN AEROBIC AND ANAEROBIC Blood Culture results may not be optimal due to an excessive volume of blood received in culture bottles   Culture   Final    NO GROWTH < 12 HOURS Performed at [facility], [address], [place], [place] [address]    Report Status PENDING  Incomplete  Culture, blood (Routine x 2)     Status: None (Preliminary result)   Collection Time: 12/09/22  9:25 PM   Specimen: BLOOD  Result Value Ref Range Status   Specimen Description BLOOD BLOOD LEFT FOREARM  Final   Special Requests   Final    BOTTLES DRAWN AEROBIC AND ANAEROBIC Blood Culture adequate volume   Culture   Final    NO GROWTH < 12 HOURS Performed at [facility], [address] [place], [place] [address]    Report Status PENDING  Incomplete  Resp panel by RT-PCR (RSV, Flu A&B, Covid) Anterior Nasal Swab     Status: None   Collection Time: 12/09/22 10:43 PM   Specimen: Anterior Nasal Swab  Result Value Ref Range Status   SARS Coronavirus  2 by RT PCR NEGATIVE NEGATIVE Final    Comment: (NOTE) SARS-CoV-2 target nucleic acids are NOT DETECTED.  The SARS-CoV-2 RNA is generally  detectable in upper respiratory specimens during the acute phase of infection. The lowest concentration of SARS-CoV-2 viral copies this assay can detect is 138 copies/mL. A negative result does not preclude SARS-Cov-2 infection and should not be used as the sole basis for treatment or other patient management decisions. A negative result may occur with  improper specimen collection/handling, submission of specimen other than nasopharyngeal swab, presence of viral mutation(s) within the areas targeted by this assay, and inadequate number of viral copies(<138 copies/mL). A negative result must be combined with clinical observations, patient history, and epidemiological information. The expected result is Negative.  Fact Sheet for Patients:  BloggerCourse.com  Fact Sheet for Healthcare Providers:  SeriousBroker.it  This test is no t yet approved or cleared by the [place] FDA and  has been authorized for detection and/or diagnosis of SARS-CoV-2 by FDA under an Emergency Use Authorization (EUA). This EUA will remain  in effect (meaning this test can be used) for the duration of the COVID-19 declaration under Section 564(b)(1) of the Act, 21 U.S.C.section 360bbb-3(b)(1), unless the authorization is terminated  or revoked sooner.       Influenza A by PCR NEGATIVE NEGATIVE Final   Influenza B by PCR NEGATIVE NEGATIVE Final    Comment: (NOTE) The Xpert Xpress SARS-CoV-2/FLU/RSV plus assay is intended as an aid in the diagnosis of influenza from Nasopharyngeal swab specimens and should not be used as a sole basis for treatment. Nasal washings and aspirates are unacceptable for Xpert Xpress SARS-CoV-2/FLU/RSV testing.  Fact Sheet for Patients: BloggerCourse.com  Fact Sheet for Healthcare Providers: SeriousBroker.it  This test is not yet approved or cleared by the [place] FDA  and has been authorized for detection and/or diagnosis of SARS-CoV-2 by FDA under an Emergency Use Authorization (EUA). This EUA will remain in effect (meaning this test can be used) for the duration of the COVID-19 declaration under Section 564(b)(1) of the Act, 21 U.S.C. section 360bbb-3(b)(1), unless the authorization is terminated or revoked.     Resp Syncytial Virus by PCR NEGATIVE NEGATIVE Final    Comment: (NOTE) Fact Sheet for Patients: BloggerCourse.com  Fact Sheet for Healthcare Providers: SeriousBroker.it  This test is not yet approved or cleared by the [place] FDA and has been authorized for detection and/or diagnosis of SARS-CoV-2 by FDA under an Emergency Use Authorization (EUA). This EUA will remain in effect (meaning this test can be used) for the duration of the COVID-19 declaration under Section 564(b)(1) of the Act, 21 U.S.C. section 360bbb-3(b)(1), unless the authorization is terminated or revoked.  Performed at [facility], [address] [address] [place], [place] [address]      Labs: BNP (last 3 results) Recent Labs    12/09/22 2125  BNP 290.0*   Basic Metabolic Panel: Recent Labs  Lab 12/09/22 2056 12/10/22 0623  NA 125* 126*  K 4.0 3.5  CL 94* 97*  CO2 20* 23  GLUCOSE 109* 110*  BUN 14 13  CREATININE 0.81 0.73  CALCIUM 8.7* 8.3*  MG  --  1.9  PHOS  --  4.1   Liver Function Tests: Recent Labs  Lab 12/09/22 2056 12/10/22 0623  AST 18 13*  ALT 15 12  ALKPHOS 91 75  BILITOT 1.0 1.0  PROT 8.0 7.0  ALBUMIN 2.8* 2.3*   CBC: Recent Labs  Lab 12/09/22 2056 12/10/22  0454  WBC 15.4* 10.6*  NEUTROABS 13.1*  --   HGB 10.7* 9.8*  HCT 33.2* 30.5*  MCV 74.8* 75.1*  PLT 318 271   Anemia work up Recent Labs    12/10/22 0622  FERRITIN 294  TIBC 185*  IRON 17*   Urinalysis    Component Value Date/Time   COLORURINE YELLOW 12/09/2022 2214   APPEARANCEUR CLEAR 12/09/2022 2214    LABSPEC 1.014 12/09/2022 2214   PHURINE 5.0 12/09/2022 2214   GLUCOSEU NEGATIVE 12/09/2022 2214   GLUCOSEU NEGATIVE 02/02/2007 0936   HGBUR SMALL (A) 12/09/2022 2214   BILIRUBINUR NEGATIVE 12/09/2022 2214   BILIRUBINUR negative 12/10/2015 1503   KETONESUR 5 (A) 12/09/2022 2214   PROTEINUR NEGATIVE 12/09/2022 2214   UROBILINOGEN 2.0 12/10/2015 1503   UROBILINOGEN 1.0 01/16/2014 1146   NITRITE NEGATIVE 12/09/2022 2214   LEUKOCYTESUR NEGATIVE 12/09/2022 2214   Sepsis Labs Recent Labs  Lab 12/09/22 2056 12/10/22 0623  WBC 15.4* 10.6*   Microbiology Recent Results (from the past 240 hour(s))  Culture, blood (Routine x 2)     Status: None (Preliminary result)   Collection Time: 12/09/22  8:56 PM   Specimen: BLOOD RIGHT FOREARM  Result Value Ref Range Status   Specimen Description BLOOD RIGHT FOREARM  Final   Special Requests   Final    BOTTLES DRAWN AEROBIC AND ANAEROBIC Blood Culture results may not be optimal due to an excessive volume of blood received in culture bottles   Culture   Final    NO GROWTH < 12 HOURS Performed at [facility], [address], [place], [place] [address]    Report Status PENDING  Incomplete  Culture, blood (Routine x 2)     Status: None (Preliminary result)   Collection Time: 12/09/22  9:25 PM   Specimen: BLOOD  Result Value Ref Range Status   Specimen Description BLOOD BLOOD LEFT FOREARM  Final   Special Requests   Final    BOTTLES DRAWN AEROBIC AND ANAEROBIC Blood Culture adequate volume   Culture   Final    NO GROWTH < 12 HOURS Performed at [facility], [address], [place], [place] [address]    Report Status PENDING  Incomplete  Resp panel by RT-PCR (RSV, Flu A&B, Covid) Anterior Nasal Swab     Status: None   Collection Time: 12/09/22 10:43 PM   Specimen: Anterior Nasal Swab  Result Value Ref Range Status   SARS Coronavirus 2 by RT PCR NEGATIVE NEGATIVE Final    Comment: (NOTE) SARS-CoV-2 target nucleic acids are NOT DETECTED.  The  SARS-CoV-2 RNA is generally detectable in upper respiratory specimens during the acute phase of infection. The lowest concentration of SARS-CoV-2 viral copies this assay can detect is 138 copies/mL. A negative result does not preclude SARS-Cov-2 infection and should not be used as the sole basis for treatment or other patient management decisions. A negative result may occur with  improper specimen collection/handling, submission of specimen other than nasopharyngeal swab, presence of viral mutation(s) within the areas targeted by this assay, and inadequate number of viral copies(<138 copies/mL). A negative result must be combined with clinical observations, patient history, and epidemiological information. The expected result is Negative.  Fact Sheet for Patients:  BloggerCourse.com  Fact Sheet for Healthcare Providers:  SeriousBroker.it  This test is no t yet approved or cleared by the [place] FDA and  has been authorized for detection and/or diagnosis of SARS-CoV-2 by FDA under an Emergency Use Authorization (EUA). This EUA  will remain  in effect (meaning this test can be used) for the duration of the COVID-19 declaration under Section 564(b)(1) of the Act, 21 U.S.C.section 360bbb-3(b)(1), unless the authorization is terminated  or revoked sooner.       Influenza A by PCR NEGATIVE NEGATIVE Final   Influenza B by PCR NEGATIVE NEGATIVE Final    Comment: (NOTE) The Xpert Xpress SARS-CoV-2/FLU/RSV plus assay is intended as an aid in the diagnosis of influenza from Nasopharyngeal swab specimens and should not be used as a sole basis for treatment. Nasal washings and aspirates are unacceptable for Xpert Xpress SARS-CoV-2/FLU/RSV testing.  Fact Sheet for Patients: BloggerCourse.com  Fact Sheet for Healthcare Providers: SeriousBroker.it  This test is not yet approved or  cleared by the [place] FDA and has been authorized for detection and/or diagnosis of SARS-CoV-2 by FDA under an Emergency Use Authorization (EUA). This EUA will remain in effect (meaning this test can be used) for the duration of the COVID-19 declaration under Section 564(b)(1) of the Act, 21 U.S.C. section 360bbb-3(b)(1), unless the authorization is terminated or revoked.     Resp Syncytial Virus by PCR NEGATIVE NEGATIVE Final    Comment: (NOTE) Fact Sheet for Patients: BloggerCourse.com  Fact Sheet for Healthcare Providers: SeriousBroker.it  This test is not yet approved or cleared by the [place] FDA and has been authorized for detection and/or diagnosis of SARS-CoV-2 by FDA under an Emergency Use Authorization (EUA). This EUA will remain in effect (meaning this test can be used) for the duration of the COVID-19 declaration under Section 564(b)(1) of the Act, 21 U.S.C. section 360bbb-3(b)(1), unless the authorization is terminated or revoked.  Performed at [facility], [address] [place], [place] [address]      Time coordinating discharge: Over 30 minutes  SIGNED:   Azucena Fallen, DO [facility] 12/10/2022, 10:02 AM Pager   If 7PM-7AM, please contact night-coverage [email or web address]

## 2022-12-10 NOTE — Progress Notes (Signed)
Pharmacy Antibiotic Note  [name] is a 72 y.o. male admitted on 12/09/2022 with sepsis.  Pharmacy has been consulted for Vancomycin/Cefepime dosing. WBC is elevated. Renal function ok. Febrile up to 101.5.   Plan: Vancomycin 2000 mg IV x 1, then 1000 mg IV q12h >>>Estimated AUC: 445 Cefepime 2g IV q8h Trend WBC, temp, renal function  F/U infectious work-up Drug levels as indicated   Height: 6\' 1"  (185.4 cm) Weight: 110.2 kg (242 lb 15.2 oz) IBW/kg (Calculated) : 79.9  Temp (24hrs), Avg:99.8 F (37.7 C), Min:98.1 F (36.7 C), Max:101.5 F (38.6 C)  Recent Labs  Lab 12/09/22 2056 12/09/22 2356  WBC 15.4*  --   CREATININE 0.81  --   LATICACIDVEN 1.0 1.1    Estimated Creatinine Clearance: 107.3 mL/min (by C-G formula based on SCr of 0.81 mg/dL).    Allergies  Allergen Reactions   Bee Venom Anaphylaxis   Bystolic [Nebivolol Hcl] Other (See Comments)    Nightmares, flashbacks (PTSD)   Codeine Shortness Of Breath and Rash   Doxycycline Shortness Of Breath and Rash   Iodinated Contrast Media Shortness Of Breath and Anaphylaxis    Lost consciousness/difficulty breathing Omni - Paque Contrast    Iohexol Hives, Shortness Of Breath and Rash    Neck and Torso erythemia IVP-DYE >> SHORTNESS OF BREATH    Levaquin [Levofloxacin] Hives and Shortness Of Breath   Oxycodone Hcl Shortness Of Breath, Swelling and Rash   Stadol [Butorphanol] Shortness Of Breath   Succinylcholine Chloride Shortness Of Breath, Nausea Only, Rash and Other (See Comments)    Difficulty breathing    Tamiflu [Oseltamivir Phosphate]     UNSPECIFIED REACTION    Atorvastatin     myalgias   Synvisc [Hylan G-F 20] Hives and Rash   Gemfibrozil     Other reaction(s): Serum cholesterol raised   Latex Other (See Comments)    blister   Lisinopril     Other reaction(s): Cough   Omeprazole    Dilaudid [Hydromorphone Hcl] Nausea Only and Rash    Aggressive    Methocarbamol Nausea Only and Rash    Niacin Rash   Pentazocine Lactate Rash    rash    Abran Duke, PharmD, BCPS Clinical Pharmacist Phone: [phone]

## 2022-12-10 NOTE — ED Notes (Signed)
Phone call made to pt's wife to inform pt has been discharge. Wife on the way to pick up pt.

## 2022-12-10 NOTE — Telephone Encounter (Cosign Needed)
Patient that he had one positive blood culture.  He states he is feeling well.  He just was discharged from hospital and just got home.  He has not had any recurrent fevers, he has no other symptoms.  I recommend he come back for repeat blood culture today to ensure he does not have true bacteremia.  Patient states he is not able to come in today but will come in tomorrow.  Discussed if he develops fever or any other symptoms he needs to come back sooner.  Patient verbalized understanding and was agreeable.

## 2022-12-10 NOTE — Care Management Obs Status (Signed)
MEDICARE OBSERVATION STATUS NOTIFICATION   Patient Details  Name: [name] MRN: [id number] Date of Birth: July 15, 1950   Medicare Observation Status Notification Given:  Yes    Villa Herb, LCSWA 12/10/2022, 10:48 AM

## 2022-12-10 NOTE — Progress Notes (Signed)
PT Cancellation Note  Patient Details Name: OSRIC KLOPF MRN: [id number] DOB: 1951/05/05   Cancelled Treatment:    Reason Eval/Treat Not Completed: PT screened, no needs identified, will sign off.   10:14 AM, 12/10/22 Ocie Bob, MPT Physical Therapist with Tomasa Hosteller [facility] [phone] office [phone] mobile phone

## 2022-12-10 NOTE — TOC CM/SW Note (Signed)
Transition of Care [facility]) - Inpatient Brief Assessment   Patient Details  Name: [name] MRN: [id number] Date of Birth: Nov 17, 1950  Transition of Care [facility]) CM/SW Contact:    Villa Herb, LCSWA Phone Number: 12/10/2022, 10:26 AM   Clinical Narrative: Transition of Care Department [facility]) has reviewed patient and no TOC needs have been identified at this time. We will continue to monitor patient advancement through interdisciplinary progression rounds. If new patient transition needs arise, please place a TOC consult.  Transition of Care Asessment: Insurance and Status: Insurance coverage has been reviewed Patient has primary care physician: Yes Home environment has been reviewed: from home with spouse Prior level of function:: independent Prior/Current Home Services: No current home services Social Determinants of Health Reivew: SDOH reviewed no interventions necessary Readmission risk has been reviewed: Yes Transition of care needs: no transition of care needs at this time

## 2022-12-11 ENCOUNTER — Emergency Department (HOSPITAL_COMMUNITY): Payer: Medicare HMO

## 2022-12-11 ENCOUNTER — Encounter (HOSPITAL_COMMUNITY): Payer: Self-pay | Admitting: *Deleted

## 2022-12-11 ENCOUNTER — Inpatient Hospital Stay (HOSPITAL_COMMUNITY)
Admission: EM | Admit: 2022-12-11 | Discharge: 2022-12-25 | DRG: 314 | Disposition: A | Discharge status: Home Health | Payer: Medicare HMO | Attending: Internal Medicine | Admitting: Internal Medicine

## 2022-12-11 ENCOUNTER — Telehealth (HOSPITAL_COMMUNITY): Payer: Self-pay

## 2022-12-11 ENCOUNTER — Other Ambulatory Visit: Payer: Self-pay

## 2022-12-11 DIAGNOSIS — Z807 Family history of other malignant neoplasms of lymphoid, hematopoietic and related tissues: Secondary | ICD-10-CM

## 2022-12-11 DIAGNOSIS — Z7901 Long term (current) use of anticoagulants: Secondary | ICD-10-CM | POA: Diagnosis not present

## 2022-12-11 DIAGNOSIS — G8929 Other chronic pain: Secondary | ICD-10-CM | POA: Diagnosis present

## 2022-12-11 DIAGNOSIS — Y712 Prosthetic and other implants, materials and accessory cardiovascular devices associated with adverse incidents: Secondary | ICD-10-CM | POA: Diagnosis present

## 2022-12-11 DIAGNOSIS — E039 Hypothyroidism, unspecified: Secondary | ICD-10-CM | POA: Diagnosis present

## 2022-12-11 DIAGNOSIS — E782 Mixed hyperlipidemia: Secondary | ICD-10-CM | POA: Diagnosis present

## 2022-12-11 DIAGNOSIS — J4489 Other specified chronic obstructive pulmonary disease: Secondary | ICD-10-CM | POA: Diagnosis present

## 2022-12-11 DIAGNOSIS — R001 Bradycardia, unspecified: Secondary | ICD-10-CM | POA: Diagnosis not present

## 2022-12-11 DIAGNOSIS — B954 Other streptococcus as the cause of diseases classified elsewhere: Secondary | ICD-10-CM | POA: Diagnosis present

## 2022-12-11 DIAGNOSIS — W010XXA Fall on same level from slipping, tripping and stumbling without subsequent striking against object, initial encounter: Secondary | ICD-10-CM | POA: Diagnosis present

## 2022-12-11 DIAGNOSIS — I4819 Other persistent atrial fibrillation: Secondary | ICD-10-CM | POA: Diagnosis present

## 2022-12-11 DIAGNOSIS — R7881 Bacteremia: Secondary | ICD-10-CM | POA: Diagnosis present

## 2022-12-11 DIAGNOSIS — R7989 Other specified abnormal findings of blood chemistry: Secondary | ICD-10-CM | POA: Diagnosis present

## 2022-12-11 DIAGNOSIS — I059 Rheumatic mitral valve disease, unspecified: Secondary | ICD-10-CM | POA: Diagnosis present

## 2022-12-11 DIAGNOSIS — I5032 Chronic diastolic (congestive) heart failure: Secondary | ICD-10-CM | POA: Diagnosis present

## 2022-12-11 DIAGNOSIS — Z8249 Family history of ischemic heart disease and other diseases of the circulatory system: Secondary | ICD-10-CM | POA: Diagnosis not present

## 2022-12-11 DIAGNOSIS — E1140 Type 2 diabetes mellitus with diabetic neuropathy, unspecified: Secondary | ICD-10-CM | POA: Diagnosis present

## 2022-12-11 DIAGNOSIS — S0083XA Contusion of other part of head, initial encounter: Secondary | ICD-10-CM | POA: Diagnosis present

## 2022-12-11 DIAGNOSIS — E118 Type 2 diabetes mellitus with unspecified complications: Secondary | ICD-10-CM | POA: Diagnosis not present

## 2022-12-11 DIAGNOSIS — Z79899 Other long term (current) drug therapy: Secondary | ICD-10-CM

## 2022-12-11 DIAGNOSIS — T826XXA Infection and inflammatory reaction due to cardiac valve prosthesis, initial encounter: Principal | ICD-10-CM | POA: Diagnosis present

## 2022-12-11 DIAGNOSIS — F03A0 Unspecified dementia, mild, without behavioral disturbance, psychotic disturbance, mood disturbance, and anxiety: Secondary | ICD-10-CM | POA: Diagnosis present

## 2022-12-11 DIAGNOSIS — Y92019 Unspecified place in single-family (private) house as the place of occurrence of the external cause: Secondary | ICD-10-CM

## 2022-12-11 DIAGNOSIS — Z881 Allergy status to other antibiotic agents status: Secondary | ICD-10-CM

## 2022-12-11 DIAGNOSIS — Z452 Encounter for adjustment and management of vascular access device: Secondary | ICD-10-CM | POA: Diagnosis not present

## 2022-12-11 DIAGNOSIS — I6523 Occlusion and stenosis of bilateral carotid arteries: Secondary | ICD-10-CM | POA: Diagnosis present

## 2022-12-11 DIAGNOSIS — Z952 Presence of prosthetic heart valve: Secondary | ICD-10-CM | POA: Diagnosis not present

## 2022-12-11 DIAGNOSIS — I110 Hypertensive heart disease with heart failure: Secondary | ICD-10-CM | POA: Diagnosis present

## 2022-12-11 DIAGNOSIS — Y9301 Activity, walking, marching and hiking: Secondary | ICD-10-CM | POA: Diagnosis present

## 2022-12-11 DIAGNOSIS — I10 Essential (primary) hypertension: Secondary | ICD-10-CM | POA: Diagnosis not present

## 2022-12-11 DIAGNOSIS — R110 Nausea: Secondary | ICD-10-CM | POA: Diagnosis present

## 2022-12-11 DIAGNOSIS — D509 Iron deficiency anemia, unspecified: Secondary | ICD-10-CM | POA: Diagnosis present

## 2022-12-11 DIAGNOSIS — F32A Depression, unspecified: Secondary | ICD-10-CM | POA: Diagnosis present

## 2022-12-11 DIAGNOSIS — Z7951 Long term (current) use of inhaled steroids: Secondary | ICD-10-CM

## 2022-12-11 DIAGNOSIS — Z823 Family history of stroke: Secondary | ICD-10-CM

## 2022-12-11 DIAGNOSIS — Z888 Allergy status to other drugs, medicaments and biological substances status: Secondary | ICD-10-CM

## 2022-12-11 DIAGNOSIS — Z885 Allergy status to narcotic agent status: Secondary | ICD-10-CM

## 2022-12-11 DIAGNOSIS — I358 Other nonrheumatic aortic valve disorders: Secondary | ICD-10-CM | POA: Diagnosis not present

## 2022-12-11 DIAGNOSIS — I340 Nonrheumatic mitral (valve) insufficiency: Secondary | ICD-10-CM | POA: Diagnosis not present

## 2022-12-11 DIAGNOSIS — Z91041 Radiographic dye allergy status: Secondary | ICD-10-CM

## 2022-12-11 DIAGNOSIS — Z87891 Personal history of nicotine dependence: Secondary | ICD-10-CM

## 2022-12-11 DIAGNOSIS — K5900 Constipation, unspecified: Secondary | ICD-10-CM | POA: Diagnosis present

## 2022-12-11 DIAGNOSIS — I330 Acute and subacute infective endocarditis: Secondary | ICD-10-CM | POA: Diagnosis present

## 2022-12-11 DIAGNOSIS — E871 Hypo-osmolality and hyponatremia: Secondary | ICD-10-CM | POA: Diagnosis present

## 2022-12-11 DIAGNOSIS — Z79890 Hormone replacement therapy (postmenopausal): Secondary | ICD-10-CM

## 2022-12-11 DIAGNOSIS — J449 Chronic obstructive pulmonary disease, unspecified: Secondary | ICD-10-CM | POA: Diagnosis present

## 2022-12-11 DIAGNOSIS — Z8719 Personal history of other diseases of the digestive system: Secondary | ICD-10-CM

## 2022-12-11 DIAGNOSIS — K219 Gastro-esophageal reflux disease without esophagitis: Secondary | ICD-10-CM | POA: Diagnosis present

## 2022-12-11 DIAGNOSIS — Z1152 Encounter for screening for COVID-19: Secondary | ICD-10-CM

## 2022-12-11 DIAGNOSIS — Z96653 Presence of artificial knee joint, bilateral: Secondary | ICD-10-CM | POA: Diagnosis present

## 2022-12-11 DIAGNOSIS — I959 Hypotension, unspecified: Secondary | ICD-10-CM | POA: Diagnosis not present

## 2022-12-11 DIAGNOSIS — F4310 Post-traumatic stress disorder, unspecified: Secondary | ICD-10-CM | POA: Diagnosis present

## 2022-12-11 DIAGNOSIS — I4891 Unspecified atrial fibrillation: Secondary | ICD-10-CM | POA: Diagnosis not present

## 2022-12-11 DIAGNOSIS — Z9181 History of falling: Secondary | ICD-10-CM

## 2022-12-11 DIAGNOSIS — Z8673 Personal history of transient ischemic attack (TIA), and cerebral infarction without residual deficits: Secondary | ICD-10-CM

## 2022-12-11 DIAGNOSIS — B955 Unspecified streptococcus as the cause of diseases classified elsewhere: Secondary | ICD-10-CM | POA: Diagnosis not present

## 2022-12-11 DIAGNOSIS — M549 Dorsalgia, unspecified: Secondary | ICD-10-CM | POA: Diagnosis present

## 2022-12-11 DIAGNOSIS — I350 Nonrheumatic aortic (valve) stenosis: Secondary | ICD-10-CM | POA: Diagnosis not present

## 2022-12-11 DIAGNOSIS — Z91040 Latex allergy status: Secondary | ICD-10-CM

## 2022-12-11 DIAGNOSIS — Z86718 Personal history of other venous thrombosis and embolism: Secondary | ICD-10-CM

## 2022-12-11 LAB — CULTURE, BLOOD (ROUTINE X 2)

## 2022-12-11 LAB — URINALYSIS, W/ REFLEX TO CULTURE (INFECTION SUSPECTED)
Bacteria, UA: NONE SEEN
Bilirubin Urine: NEGATIVE
Glucose, UA: NEGATIVE mg/dL
Hgb urine dipstick: NEGATIVE
Ketones, ur: NEGATIVE mg/dL
Leukocytes,Ua: NEGATIVE
Nitrite: NEGATIVE
Protein, ur: NEGATIVE mg/dL
Specific Gravity, Urine: 1.014 (ref 1.005–1.030)
pH: 6 (ref 5.0–8.0)

## 2022-12-11 LAB — CBC WITH DIFFERENTIAL/PLATELET
Abs Immature Granulocytes: 0.03 10*3/uL (ref 0.00–0.07)
Basophils Absolute: 0.1 10*3/uL (ref 0.0–0.1)
Basophils Relative: 1 %
Eosinophils Absolute: 0.4 10*3/uL (ref 0.0–0.5)
Eosinophils Relative: 4 %
HCT: 32.7 % — ABNORMAL LOW (ref 39.0–52.0)
Hemoglobin: 10.4 g/dL — ABNORMAL LOW (ref 13.0–17.0)
Immature Granulocytes: 0 %
Lymphocytes Relative: 18 %
Lymphs Abs: 1.7 10*3/uL (ref 0.7–4.0)
MCH: 24.2 pg — ABNORMAL LOW (ref 26.0–34.0)
MCHC: 31.8 g/dL (ref 30.0–36.0)
MCV: 76.2 fL — ABNORMAL LOW (ref 80.0–100.0)
Monocytes Absolute: 0.7 10*3/uL (ref 0.1–1.0)
Monocytes Relative: 8 %
Neutro Abs: 6.7 10*3/uL (ref 1.7–7.7)
Neutrophils Relative %: 69 %
Platelets: 338 10*3/uL (ref 150–400)
RBC: 4.29 MIL/uL (ref 4.22–5.81)
RDW: 16.1 % — ABNORMAL HIGH (ref 11.5–15.5)
WBC: 9.6 10*3/uL (ref 4.0–10.5)
nRBC: 0 % (ref 0.0–0.2)

## 2022-12-11 LAB — COMPREHENSIVE METABOLIC PANEL
ALT: 14 U/L (ref 0–44)
AST: 17 U/L (ref 15–41)
Albumin: 2.8 g/dL — ABNORMAL LOW (ref 3.5–5.0)
Alkaline Phosphatase: 88 U/L (ref 38–126)
Anion gap: 8 (ref 5–15)
BUN: 9 mg/dL (ref 8–23)
CO2: 25 mmol/L (ref 22–32)
Calcium: 8.8 mg/dL — ABNORMAL LOW (ref 8.9–10.3)
Chloride: 96 mmol/L — ABNORMAL LOW (ref 98–111)
Creatinine, Ser: 0.78 mg/dL (ref 0.61–1.24)
GFR, Estimated: >60 mL/min (ref >60)
Glucose, Bld: 101 mg/dL — ABNORMAL HIGH (ref 70–99)
Potassium: 4 mmol/L (ref 3.5–5.1)
Sodium: 129 mmol/L — ABNORMAL LOW (ref 135–145)
Total Bilirubin: 0.6 mg/dL (ref 0.3–1.2)
Total Protein: 8.2 g/dL — ABNORMAL HIGH (ref 6.5–8.1)

## 2022-12-11 LAB — LACTIC ACID, PLASMA
Lactic Acid, Venous: 0.9 mmol/L (ref 0.5–1.9)
Lactic Acid, Venous: 1 mmol/L (ref 0.5–1.9)

## 2022-12-11 LAB — GLUCOSE, CAPILLARY: Glucose-Capillary: 80 mg/dL (ref 70–99)

## 2022-12-11 MED ORDER — SERTRALINE HCL 50 MG PO TABS
150.0000 mg | ORAL_TABLET | Freq: Every day | ORAL | Status: DC
  Administered 2022-12-12 – 2022-12-25 (×14): 150 mg via ORAL
  Filled 2022-12-11 (×14): qty 3

## 2022-12-11 MED ORDER — INSULIN ASPART 100 UNIT/ML IJ SOLN
0.0000 [IU] | Freq: Three times a day (TID) | INTRAMUSCULAR | Status: DC
  Administered 2022-12-13 (×2): 1 [IU] via SUBCUTANEOUS
  Administered 2022-12-14: 2 [IU] via SUBCUTANEOUS
  Administered 2022-12-16 – 2022-12-19 (×4): 1 [IU] via SUBCUTANEOUS
  Administered 2022-12-23: 2 [IU] via SUBCUTANEOUS
  Administered 2022-12-24 – 2022-12-25 (×2): 1 [IU] via SUBCUTANEOUS

## 2022-12-11 MED ORDER — LOSARTAN POTASSIUM 50 MG PO TABS
100.0000 mg | ORAL_TABLET | Freq: Every day | ORAL | Status: DC
  Administered 2022-12-12 – 2022-12-18 (×7): 100 mg via ORAL
  Filled 2022-12-11 (×7): qty 2

## 2022-12-11 MED ORDER — SODIUM CHLORIDE 0.9 % IV SOLN: INTRAVENOUS | Status: DC

## 2022-12-11 MED ORDER — ALPRAZOLAM 0.5 MG PO TABS
1.0000 mg | ORAL_TABLET | Freq: Three times a day (TID) | ORAL | Status: DC | PRN
  Administered 2022-12-11 – 2022-12-22 (×3): 1 mg via ORAL
  Filled 2022-12-11 (×3): qty 2

## 2022-12-11 MED ORDER — APIXABAN 5 MG PO TABS
5.0000 mg | ORAL_TABLET | Freq: Two times a day (BID) | ORAL | Status: DC
  Administered 2022-12-11 – 2022-12-25 (×28): 5 mg via ORAL
  Filled 2022-12-11 (×28): qty 1

## 2022-12-11 MED ORDER — ROSUVASTATIN CALCIUM 20 MG PO TABS
20.0000 mg | ORAL_TABLET | Freq: Every day | ORAL | Status: DC
  Administered 2022-12-12 – 2022-12-25 (×14): 20 mg via ORAL
  Filled 2022-12-11 (×14): qty 1

## 2022-12-11 MED ORDER — MORPHINE SULFATE 15 MG PO TABS
15.0000 mg | ORAL_TABLET | ORAL | Status: DC | PRN
  Administered 2022-12-11 – 2022-12-25 (×23): 15 mg via ORAL
  Filled 2022-12-11 (×23): qty 1

## 2022-12-11 MED ORDER — LEVOTHYROXINE SODIUM 25 MCG PO TABS
25.0000 ug | ORAL_TABLET | Freq: Every day | ORAL | Status: DC
  Administered 2022-12-13 – 2022-12-25 (×13): 25 ug via ORAL
  Filled 2022-12-11 (×14): qty 1

## 2022-12-11 MED ORDER — ONDANSETRON 4 MG PO TBDP
8.0000 mg | ORAL_TABLET | Freq: Three times a day (TID) | ORAL | Status: DC | PRN
  Administered 2022-12-15: 8 mg via ORAL
  Filled 2022-12-11: qty 2

## 2022-12-11 MED ORDER — CARVEDILOL 25 MG PO TABS
25.0000 mg | ORAL_TABLET | Freq: Two times a day (BID) | ORAL | Status: DC
  Administered 2022-12-11 – 2022-12-22 (×18): 25 mg via ORAL
  Filled 2022-12-11 (×21): qty 1

## 2022-12-11 MED ORDER — FLUTICASONE FUROATE-VILANTEROL 200-25 MCG/ACT IN AEPB
1.0000 | INHALATION_SPRAY | Freq: Every day | RESPIRATORY_TRACT | Status: DC
  Administered 2022-12-12 – 2022-12-25 (×14): 1 via RESPIRATORY_TRACT
  Filled 2022-12-11 (×2): qty 28

## 2022-12-11 MED ORDER — HYOSCYAMINE SULFATE 0.125 MG SL SUBL: 0.1250 mg | SUBLINGUAL_TABLET | Freq: Three times a day (TID) | SUBLINGUAL | Status: DC | PRN

## 2022-12-11 MED ORDER — SODIUM CHLORIDE 0.9 % IV SOLN
2.0000 g | Freq: Once | INTRAVENOUS | Status: AC
  Administered 2022-12-11: 2 g via INTRAVENOUS
  Filled 2022-12-11: qty 20

## 2022-12-11 MED ORDER — NALDEMEDINE TOSYLATE 0.2 MG PO TABS: 0.2000 mg | ORAL_TABLET | Freq: Every day | ORAL | Status: DC | PRN

## 2022-12-11 MED ORDER — INSULIN ASPART 100 UNIT/ML IJ SOLN: 0.0000 [IU] | Freq: Every day | INTRAMUSCULAR | Status: DC

## 2022-12-11 MED ORDER — SPIRONOLACTONE 12.5 MG HALF TABLET
12.5000 mg | ORAL_TABLET | Freq: Every day | ORAL | Status: DC
  Administered 2022-12-12 – 2022-12-25 (×14): 12.5 mg via ORAL
  Filled 2022-12-11 (×14): qty 1

## 2022-12-11 MED ORDER — SODIUM CHLORIDE 0.9 % IV SOLN
2.0000 g | INTRAVENOUS | Status: DC
  Administered 2022-12-12 – 2022-12-25 (×14): 2 g via INTRAVENOUS
  Filled 2022-12-11 (×13): qty 20

## 2022-12-11 NOTE — Progress Notes (Signed)
ID Brief note   Patient has been called back for admission given strep infantarius bacteremia after recent admission with fall for further evaluation and management. H/o TAVR noted.   Repeat 2 sets of blood cultures and start IV ceftriaxone thereafter  Needs TTE to be  followed by TEE  Dr Drue Second covering this weekend. Full consult to follow tomorrow.   Odette Fraction, MD Infectious Disease Physician [facility] [address] [place], [place] [address] Phone: [phone]  Fax: [phone]

## 2022-12-11 NOTE — H&P (Signed)
History and Physical    Patient: [name] NWG:[id number] DOB: 1950-12-25 DOA: 12/11/2022 DOS: the patient was seen and examined on 12/11/2022 PCP: [name], MD  Patient coming from: Home  Chief Complaint:  Chief Complaint  Patient presents with   Abnormal labs   HPI: [name] is a 72 y.o. male with medical history significant of history of TAVR, COPD, mild dementia, GERD, history of CVA, diabetes, hypertension, atrial fibrillation on anticoagulation.  Patient presents after being hospitalized overnight due to fever.  He was discharged home.  His blood cultures turned positive for Streptococcus infantarius.  He otherwise feels fine and without any more problems.  He also relates a history of falling.  He falls once twice a week where he loses his balance because he feels lightheaded.  He does not know whether this is the result of being in atrial fibrillation or whether there is something else going on.  This has been going on for the past few months.  He has been referred to cardiology and neurology for evaluation of his falls.  He currently denies fevers, chills, nausea, vomiting, abdominal pain.  He denies chest pain or shortness of breath.  Review of Systems: As mentioned in the history of present illness. All other systems reviewed and are negative. Past Medical History:  Diagnosis Date   Anemia    Asthma    Back pain, chronic, followed at pain clinic 11/25/2011   Carotid artery stenosis 09/06/2014   By Dopplers in September 2017: Right ICA 40-59%.  Less than 40% left ICA.   Colon polyps    COPD (chronic obstructive pulmonary disease) (HCC)    Dementia (HCC)    Mild   Depression    Dyslipidemia (high LDL; low HDL)    statin intolerant; on fibrate   Edema leg from Venous Stasis    Venous stasis :wears compression stockings; 08/2012 dopplers - no DVT or thrombophlebitis; mild R Popliteal V reflux - no VNUS ablation candidate   GERD (gastroesophageal reflux  disease)    Hearing loss    History of CVA (cerebrovascular accident)    History of diabetes mellitus    History of DVT (deep vein thrombosis)    Hypertension    very labile   Nephrolithiasis    Osteoarthritis    Other idiopathic peripheral autonomic neuropathy    Agent orange   PAF (paroxysmal atrial fibrillation) (HCC)    PONV (postoperative nausea and vomiting)    PTSD (post-traumatic stress disorder) 07/24/2015   per patient approach from foot of bed to awake; do not apply any contricting pressure, also avoid approaching from behind with any loud noises    S/P TAVR (transcatheter aortic valve replacement) 08/08/2019   s/p TAVR w/ a 29 mm Edwards Sapien 3 THV via the TF approach by Drs Excell Seltzer and Cornelius Moras   Seizure [facility])    Severe aortic stenosis    Skull fracture (HCC)    Small bowel obstruction (HCC) 1990s, 2001, 2015   s/p multiple bowel surgeries; from war wounds   Past Surgical History:  Procedure Laterality Date   APPENDECTOMY  1958   per patient   Carotid Dopplers  01/2016   Stable. RICA - slight progression from<40% to 40-59%.  Otherwise stable bilateral carotids and subclavian arteries. Stable LICA   COLONOSCOPY     DENTAL SURGERY  01/14/2017   5 extractions in preparation for LTKA on 01-25-17; also was started on amoxicillin x 7days; has since completed therapy  exploratory laparotomy with extensive lysis of adhesions  10/1999   JOINT REPLACEMENT     right knee replaced 2x   LEFT HEART CATHETERIZATION WITH CORONARY ANGIOGRAM N/A 11/26/2011   WNL Runell Gess, MD   NM MYOVIEW LTD  08/2011   Negative lexiscan myoview: No ischemia or infarction   RIGHT/LEFT HEART CATH AND CORONARY ANGIOGRAPHY N/A 10/22/2016   Procedure: Right/Left Heart Cath and Coronary Angiography;  Surgeon: Marykay Lex, MD;  Location: [facility] INVASIVE CV LAB:: Angiographically normal/minimal CAD with tortuous coronary arteries.  Mild pulmonary pretension secondary to severely elevated LVEDP and  systemic hypertension.  Mild-moderate aortic valve stenosis with mean gradient 21 mmHg.   RIGHT/LEFT HEART CATH AND CORONARY ANGIOGRAPHY N/A 07/13/2019   Procedure: RIGHT/LEFT HEART CATH AND CORONARY ANGIOGRAPHY;  Surgeon: Marykay Lex, MD;  Location: [facility] INVASIVE CV LAB;  Service: Cardiovascular;; : Angiographically normal coronary arteries.  Hemodynamic findings consistent with severe stenosis.  Normal RHC Pressures.   small bowel obstruction  1996, 1999, 2001, 2015   TEE WITHOUT CARDIOVERSION N/A 08/08/2019   Procedure: TRANSESOPHAGEAL ECHOCARDIOGRAM (TEE);  Surgeon: Tonny Bollman, MD;  Location: [facility] INVASIVE CV LAB;  Service: Open Heart Surgery;  Laterality: N/A;   TONSILLECTOMY  1960   per patient   TOTAL KNEE ARTHROPLASTY     TOTAL KNEE ARTHROPLASTY Left 01/25/2017   Procedure: LEFT TOTAL KNEE ARTHROPLASTY;  Surgeon: Ollen Gross, MD;  Location: [facility] ORS;  Service: Orthopedics;  Laterality: Left;  Adductor Block   TRANSCATHETER AORTIC VALVE REPLACEMENT, TRANSFEMORAL N/A 08/08/2019   Procedure: TRANSCATHETER AORTIC VALVE REPLACEMENT, TRANSFEMORAL;  Surgeon: Tonny Bollman, MD;  Location: [facility] INVASIVE CV LAB;  Service: Open Heart Surgery;  Laterality: N/A;   TRANSTHORACIC ECHOCARDIOGRAM  06/07/2019    Severe calcific aortic stenosis-mean gradient up to 49 mmHg the peak of 79 mmHg.Mild-Mod AI.  Normal LV size and function.  EF 65 to 70%.  Mild concentric LVH with GR 1 DD.  Biatrial size-normal.  Moderate mitral valve calcification with mild MAC and mild MR -> compared to June 2020-increase in both gradients from mean 42 mmHg and peak 66 mmHg   TRANSTHORACIC ECHOCARDIOGRAM  07/2019   a) post TAVR echo 08/09/2019:POD #1 - EF 60-65%, nl fxn TAVR with a mAVg of 14 mm Hg and no PVL.;;  09/07/2019:  o 1 month echo showed EF 65%, normally functioning TAVR with a mean gradient of 13 mm Hg and no PVL.   TRANSTHORACIC ECHOCARDIOGRAM  09/04/2020   1 year post TAVR:  EF 70 to 75%.  Hyperdynamic LV  function.  GR 1 DD-mild LA dilation.[name]  No RWMA.  Normal RV function.  Unable to assess PAP-mildly elevated RAP.  TAVR valve in place.  Mean gradient 11 mmHg no PVL.  Mild ascending aortic dilation.  No change from previous echo   WRIST FRACTURE SURGERY     Social History:  reports that he has never smoked. He has quit using smokeless tobacco. He reports current alcohol use. He reports that he does not use drugs.  Allergies  Allergen Reactions   Bee Venom Anaphylaxis   Bystolic [Nebivolol Hcl] Other (See Comments)    Nightmares, flashbacks (PTSD)   Codeine Shortness Of Breath and Rash   Doxycycline Shortness Of Breath and Rash   Iodinated Contrast Media Shortness Of Breath and Anaphylaxis    Lost consciousness/difficulty breathing Omni - Paque Contrast    Iohexol Hives, Shortness Of Breath and Rash    Neck and Torso erythemia IVP-DYE >> SHORTNESS  OF BREATH    Levaquin [Levofloxacin] Hives and Shortness Of Breath   Oxycodone Hcl Shortness Of Breath, Swelling and Rash   Stadol [Butorphanol] Shortness Of Breath   Succinylcholine Chloride Shortness Of Breath, Nausea Only, Rash and Other (See Comments)    Difficulty breathing    Tamiflu [Oseltamivir Phosphate]     UNSPECIFIED REACTION    Atorvastatin     myalgias   Synvisc [Hylan G-F 20] Hives and Rash   Gemfibrozil     Other reaction(s): Serum cholesterol raised   Latex Other (See Comments)    blister   Lisinopril     Other reaction(s): Cough   Omeprazole    Dilaudid [Hydromorphone Hcl] Nausea Only and Rash    Aggressive    Methocarbamol Nausea Only and Rash   Niacin Rash   Pentazocine Lactate Rash    rash    Family History  Problem Relation Age of Onset   Stroke Mother    Heart disease Mother    Heart attack Mother    Heart disease Father    Heart attack Father    Heart attack Sister    Heart attack Brother    Hypertension Brother    Heart attack Son    Stroke Son    Hypertension Brother    Sleep apnea Son     Non-Hodgkin's lymphoma Daughter     Prior to Admission medications   Medication Sig Start Date End Date Taking? Authorizing Provider  albuterol (ACCUNEB) 0.63 MG/3ML nebulizer solution Take 1 ampule by nebulization every 6 (six) hours as needed for wheezing or shortness of breath.     [provider]  ALPRAZolam Prudy Feeler) 1 MG tablet Take 1 tablet (1 mg total) by mouth 3 (three) times daily as needed for anxiety or sleep. 01/22/14   Dorothea Ogle, MD  apixaban (ELIQUIS) 5 MG TABS tablet Take 5 mg by mouth 2 (two) times daily.    [provider]  beclomethasone (BECONASE-AQ) 42 MCG/SPRAY nasal spray Place 1 spray into both nostrils daily.     [provider]  budesonide-formoterol (SYMBICORT) 160-4.5 MCG/ACT inhaler INHALE 2 PUFFS INTO THE LUNGS TWICE A DAY RINSE MOUTH WELL AFTER USE Patient taking differently: Inhale 2 puffs into the lungs daily. 02/09/19   Jetty Duhamel D, MD  carvedilol (COREG) 25 MG tablet Take 1 tablet (25 mg total) by mouth 2 (two) times daily. 06/15/18 02/07/22  Marykay Lex, MD  Cholecalciferol (VITAMIN D-3) 125 MCG (5000 UT) TABS Take 5,000 Units by mouth daily.    [provider]  cyclobenzaprine (FLEXERIL) 10 MG tablet Take 5 mg by mouth daily as needed for muscle spasms. 02/29/20   [provider]  EPINEPHrine 0.3 mg/0.3 mL IJ SOAJ injection Inject 0.3 mg into the muscle once as needed for anaphylaxis. 09/09/21   [provider]  fluticasone-salmeterol (ADVAIR) 250-50 MCG/ACT AEPB Inhale 1 puff into the lungs in the morning and at bedtime. 09/09/21   [provider]  furosemide (LASIX) 20 MG tablet Take 1 tablet (20 mg total) by mouth 2 (two) times daily. As directed Patient taking differently: Take 20 mg by mouth daily as needed for fluid. 09/01/19   Marykay Lex, MD  hyoscyamine (LEVSIN) 0.125 MG tablet Take 1 tablet by mouth 3 (three) times daily as needed for bladder spasms. 02/13/14   [provider]  levothyroxine (SYNTHROID) 25 MCG tablet Take 25 mcg by mouth daily. 02/16/20   [provider]  losartan (COZAAR)  100 MG tablet Take 100 mg by mouth daily. 09/09/21   [provider]  Magnesium Hydroxide (MILK OF MAGNESIA PO) Take 1 Dose by mouth as needed (constipation). 03/25/17   [provider]  morphine (MSIR) 15 MG tablet Take 15 mg by mouth every 4 (four) hours as needed for severe pain. 09/25/17   [provider]  Multiple Vitamin (MULTIVITAMIN WITH MINERALS) TABS tablet Take 1 tablet by mouth at bedtime.    [provider]  nitroGLYCERIN (NITROSTAT) 0.4 MG SL tablet Place 1 tablet (0.4 mg total) under the tongue every 5 (five) minutes as needed for chest pain. 08/07/19   Janetta Hora, PA-C  ondansetron (ZOFRAN-ODT) 8 MG disintegrating tablet Take 1 tablet (8 mg total) by mouth every 8 (eight) hours as needed for nausea. 02/07/22   Derwood Kaplan, MD  promethazine (PHENERGAN) 25 MG tablet Take 25 mg by mouth 4 (four) times daily as needed for nausea or vomiting. 03/25/17   [provider]  Propylene Glycol (SYSTANE COMPLETE OP) Place 1 drop into both eyes daily as needed (dry eyes).    [provider]  Respiratory Therapy Supplies (FLUTTER) DEVI Use as directed 01/10/15   Jetty Duhamel D, MD  rosuvastatin (CRESTOR) 40 MG tablet Take 40 mg by mouth daily.    [provider]  sertraline (ZOLOFT) 100 MG tablet Take 150 mg by mouth daily. 09/09/21   [provider]  spironolactone (ALDACTONE) 25 MG tablet TAKE 1/2 TABLET BY MOUTH EVERY DAY Patient taking differently: Take 25 mg by mouth daily. 08/23/20   Tonny Bollman, MD  SYMPROIC 0.2 MG TABS Take 0.2 mg by mouth daily as needed (constipation.).    [provider]    Physical Exam: Vitals:   12/11/22 1730 12/11/22 1745 12/11/22 1800 12/11/22 1836  BP: (!) 172/112 (!) 193/91 (!) 181/101   Pulse:  78 79   Resp:      Temp:      TempSrc:       SpO2:  96% 98% 98%   General: Elderly male. Awake and alert and oriented x3. No acute cardiopulmonary distress.  HEENT: Normocephalic atraumatic.  Right and left ears normal in appearance.  Pupils equal, round, reactive to light. Extraocular muscles are intact. Sclerae anicteric and noninjected.  Moist mucosal membranes. No mucosal lesions.  Neck: Neck supple without lymphadenopathy. No carotid bruits. No masses palpated.  Cardiovascular: Regular rate with normal S1-S2 sounds. No murmurs, rubs, gallops auscultated. No JVD.  Respiratory: Good respiratory effort with no wheezes, rales, rhonchi. Lungs clear to auscultation bilaterally.  No accessory muscle use. Abdomen: Soft, nontender, nondistended. Active bowel sounds. No masses or hepatosplenomegaly  Skin: No rashes, lesions, or ulcerations.  Dry, warm to touch. 2+ dorsalis pedis and radial pulses. Musculoskeletal: No calf or leg pain. All major joints not erythematous nontender.  No upper or lower joint deformation.  Good ROM.  No contractures  Psychiatric: Intact judgment and insight. Pleasant and cooperative. Neurologic: No focal neurological deficits. Strength is 5/5 and symmetric in upper and lower extremities.  Cranial nerves II through XII are grossly intact.  Data Reviewed: Results for orders placed or performed during the hospital encounter of 12/11/22 (from the past 24 hour(s))  Comprehensive metabolic panel     Status: Abnormal   Collection Time: 12/11/22  4:49 PM  Result Value Ref Range   Sodium 129 (L) 135 - 145 mmol/L   Potassium 4.0 3.5 - 5.1 mmol/L   Chloride 96 (L) 98 -  111 mmol/L   CO2 25 22 - 32 mmol/L   Glucose, Bld 101 (H) 70 - 99 mg/dL   BUN 9 8 - 23 mg/dL   Creatinine, Ser 1.61 0.61 - 1.24 mg/dL   Calcium 8.8 (L) 8.9 - 10.3 mg/dL   Total Protein 8.2 (H) 6.5 - 8.1 g/dL   Albumin 2.8 (L) 3.5 - 5.0 g/dL   AST 17 15 - 41 U/L   ALT 14 0 - 44 U/L   Alkaline Phosphatase 88 38 - 126 U/L   Total Bilirubin 0.6 0.3 - 1.2  mg/dL   GFR, Estimated >09 >60 mL/min   Anion gap 8 5 - 15  Lactic acid, plasma     Status: None   Collection Time: 12/11/22  4:49 PM  Result Value Ref Range   Lactic Acid, Venous 0.9 0.5 - 1.9 mmol/L  CBC with Differential     Status: Abnormal   Collection Time: 12/11/22  4:49 PM  Result Value Ref Range   WBC 9.6 4.0 - 10.5 K/uL   RBC 4.29 4.22 - 5.81 MIL/uL   Hemoglobin 10.4 (L) 13.0 - 17.0 g/dL   HCT 45.4 (L) 09.8 - 11.9 %   MCV 76.2 (L) 80.0 - 100.0 fL   MCH 24.2 (L) 26.0 - 34.0 pg   MCHC 31.8 30.0 - 36.0 g/dL   RDW 14.7 (H) 82.9 - 56.2 %   Platelets 338 150 - 400 K/uL   nRBC 0.0 0.0 - 0.2 %   Neutrophils Relative % 69 %   Neutro Abs 6.7 1.7 - 7.7 K/uL   Lymphocytes Relative 18 %   Lymphs Abs 1.7 0.7 - 4.0 K/uL   Monocytes Relative 8 %   Monocytes Absolute 0.7 0.1 - 1.0 K/uL   Eosinophils Relative 4 %   Eosinophils Absolute 0.4 0.0 - 0.5 K/uL   Basophils Relative 1 %   Basophils Absolute 0.1 0.0 - 0.1 K/uL   Immature Granulocytes 0 %   Abs Immature Granulocytes 0.03 0.00 - 0.07 K/uL    DG Chest 2 View  Result Date: 12/11/2022 CLINICAL DATA:  Cough. EXAM: CHEST - 2 VIEW COMPARISON:  December 09, 2022. FINDINGS: The heart size and mediastinal contours are within normal limits. Status post transcatheter aortic valve repair. Both lungs are clear. The visualized skeletal structures are unremarkable. IMPRESSION: No active cardiopulmonary disease. Electronically Signed   By: Lupita Raider M.D.   On: 12/11/2022 16:15     Assessment and Plan: No notes have been filed under this hospital service. Service: Hospitalist  Principal Problem:   Bacteremia Active Problems:   Diabetes mellitus type 2, controlled, with complications (HCC)   Hypertensive heart disease with chronic diastolic congestive heart failure (HCC)   GERD   COPD mixed type (HCC)   S/P TAVR (transcatheter aortic valve replacement)   Essential hypertension   Acquired hypothyroidism  Bacteremia Cultures  reobtained Start ceftriaxone 2 g Echocardiogram: Will need both TTE and TEE CBC in the morning Falls PT eval Diabetes  Sliding scale insulin Hypertension Continue blood pressure medications History of TAVR Hypothyroidism Continue Synthroid COPD Continue long-acting inhalers   Advance Care Planning:   Code Status: Full Code confirmed by patient  Consults: None  Family Communication: Wife present during interview and exam.  She participates in the interview process and provides additional history  Severity of Illness: The appropriate patient status for this patient is INPATIENT. Inpatient status is judged to be reasonable and necessary in order to provide the  required intensity of service to ensure the patient's safety. The patient's presenting symptoms, physical exam findings, and initial radiographic and laboratory data in the context of their chronic comorbidities is felt to place them at high risk for further clinical deterioration. Furthermore, it is not anticipated that the patient will be medically stable for discharge from the hospital within 2 midnights of admission.   * I certify that at the point of admission it is my clinical judgment that the patient will require inpatient hospital care spanning beyond 2 midnights from the point of admission due to high intensity of service, high risk for further deterioration and high frequency of surveillance required.*  Author: Levie Heritage, DO 12/11/2022 6:49 PM  For on call review [email or web address].

## 2022-12-11 NOTE — ED Provider Notes (Signed)
[place] EMERGENCY DEPARTMENT AT [facility] Provider Note   CSN: [id number] Arrival date & time: 12/11/22  1520     History  Chief Complaint  Patient presents with   Abnormal labs    [name] is a 72 y.o. male.  He has a history of hypertension COPD has a TAVR, diabetes is on Eliquis.  He was here couple days ago for a fall and had a fever.  Had blood cultures drawn and was ultimately discharged home.  He denies any fevers since discharge.  He has had a headache a little bit of a cough with some yellow sputum.  He got a call today saying he needed to come back and be admitted for 4 blood cultures that were positive.  He otherwise feels pretty well.  Has been eating and drinking well.  Said his urine is a little darker than normal but no dysuria.  No diarrhea.  The history is provided by the patient and the spouse.       Home Medications Prior to Admission medications   Medication Sig Start Date End Date Taking? Authorizing Provider  albuterol (ACCUNEB) 0.63 MG/3ML nebulizer solution Take 1 ampule by nebulization every 6 (six) hours as needed for wheezing or shortness of breath.     [provider]  ALPRAZolam Prudy Feeler) 1 MG tablet Take 1 tablet (1 mg total) by mouth 3 (three) times daily as needed for anxiety or sleep. 01/22/14   Dorothea Ogle, MD  apixaban (ELIQUIS) 5 MG TABS tablet Take 5 mg by mouth 2 (two) times daily.    [provider]  beclomethasone (BECONASE-AQ) 42 MCG/SPRAY nasal spray Place 1 spray into both nostrils daily.     [provider]  budesonide-formoterol (SYMBICORT) 160-4.5 MCG/ACT inhaler INHALE 2 PUFFS INTO THE LUNGS TWICE A DAY RINSE MOUTH WELL AFTER USE Patient taking differently: Inhale 2 puffs into the lungs daily. 02/09/19   Jetty Duhamel D, MD  carvedilol (COREG) 25 MG tablet Take 1 tablet (25 mg total) by mouth 2 (two) times daily. 06/15/18 02/07/22  Marykay Lex, MD  Cholecalciferol (VITAMIN D-3) 125 MCG  (5000 UT) TABS Take 5,000 Units by mouth daily.    [provider]  cyclobenzaprine (FLEXERIL) 10 MG tablet Take 5 mg by mouth daily as needed for muscle spasms. 02/29/20   [provider]  EPINEPHrine 0.3 mg/0.3 mL IJ SOAJ injection Inject 0.3 mg into the muscle once as needed for anaphylaxis. 09/09/21   [provider]  fluticasone-salmeterol (ADVAIR) 250-50 MCG/ACT AEPB Inhale 1 puff into the lungs in the morning and at bedtime. 09/09/21   [provider]  furosemide (LASIX) 20 MG tablet Take 1 tablet (20 mg total) by mouth 2 (two) times daily. As directed Patient taking differently: Take 20 mg by mouth daily as needed for fluid. 09/01/19   Marykay Lex, MD  hyoscyamine (LEVSIN) 0.125 MG tablet Take 1 tablet by mouth 3 (three) times daily as needed for bladder spasms. 02/13/14   [provider]  irbesartan (AVAPRO) 300 MG tablet TAKE 1 TABLET BY MOUTH EVERY DAY Patient not taking: Reported on 02/07/2022 11/28/19   Marykay Lex, MD  levothyroxine (SYNTHROID) 25 MCG tablet Take 25 mcg by mouth daily. 02/16/20   [provider]  losartan (COZAAR) 100 MG tablet Take 100 mg by mouth daily. 09/09/21   [provider]  Magnesium Hydroxide (MILK OF MAGNESIA PO) Take 1 Dose by mouth as needed (constipation). 03/25/17  [provider]  morphine (MSIR) 15 MG tablet Take 15 mg by mouth every 4 (four) hours as needed for severe pain. 09/25/17   [provider]  Multiple Vitamin (MULTIVITAMIN WITH MINERALS) TABS tablet Take 1 tablet by mouth at bedtime.    [provider]  nitroGLYCERIN (NITROSTAT) 0.4 MG SL tablet Place 1 tablet (0.4 mg total) under the tongue every 5 (five) minutes as needed for chest pain. 08/07/19   Janetta Hora, PA-C  ondansetron (ZOFRAN-ODT) 8 MG disintegrating tablet Take 1 tablet (8 mg total) by mouth every 8 (eight) hours as needed for nausea. 02/07/22   Derwood Kaplan, MD  predniSONE (DELTASONE)  10 MG tablet Take 4 tabs for 2 days, then 3 tabs for 2 days, 2 tabs for 2 days, then 1 tab for 2 days, then stop. Patient not taking: Reported on 02/07/2022 06/23/21   Waymon Budge, MD  promethazine (PHENERGAN) 25 MG tablet Take 25 mg by mouth 4 (four) times daily as needed for nausea or vomiting. 03/25/17   [provider]  Propylene Glycol (SYSTANE COMPLETE OP) Place 1 drop into both eyes daily as needed (dry eyes).    [provider]  Respiratory Therapy Supplies (FLUTTER) DEVI Use as directed 01/10/15   Jetty Duhamel D, MD  rosuvastatin (CRESTOR) 40 MG tablet Take 40 mg by mouth daily.    [provider]  sertraline (ZOLOFT) 100 MG tablet Take 150 mg by mouth daily. 09/09/21   [provider]  spironolactone (ALDACTONE) 25 MG tablet TAKE 1/2 TABLET BY MOUTH EVERY DAY Patient taking differently: Take 25 mg by mouth daily. 08/23/20   Tonny Bollman, MD  SYMPROIC 0.2 MG TABS Take 0.2 mg by mouth daily as needed (constipation.).    [provider]      Allergies    Bee venom, Bystolic [nebivolol hcl], Codeine, Doxycycline, Iodinated contrast media, Iohexol, Levaquin [levofloxacin], Oxycodone hcl, Stadol [butorphanol], Succinylcholine chloride, Tamiflu [oseltamivir phosphate], Atorvastatin, Synvisc [hylan g-f 20], Gemfibrozil, Latex, Lisinopril, Omeprazole, Dilaudid [hydromorphone hcl], Methocarbamol, Niacin, and Pentazocine lactate    Review of Systems   Review of Systems  Constitutional:  Negative for chills and fever.  Eyes:  Negative for visual disturbance.  Respiratory:  Positive for cough. Negative for shortness of breath.   Cardiovascular:  Negative for chest pain.  Gastrointestinal:  Negative for abdominal pain.  Genitourinary:  Negative for dysuria.  Skin:  Negative for wound.  Neurological:  Positive for headaches.    Physical Exam Updated Vital Signs BP (!) 170/106   Pulse 92   Temp 97.7 F (36.5 C) (Oral)   Resp 17   SpO2 100%   Physical Exam Vitals and nursing note reviewed.  Constitutional:      General: He is not in acute distress.    Appearance: Normal appearance. He is well-developed.  HENT:     Head: Normocephalic and atraumatic.  Eyes:     Conjunctiva/sclera: Conjunctivae normal.  Cardiovascular:     Rate and Rhythm: Normal rate and regular rhythm.     Heart sounds: No murmur heard. Pulmonary:     Effort: Pulmonary effort is normal. No respiratory distress.     Breath sounds: Normal breath sounds.  Abdominal:     Palpations: Abdomen is soft.     Tenderness: There is no abdominal tenderness. There is no guarding or rebound.  Musculoskeletal:        General: No deformity. Normal range of motion.     Cervical back: Neck  supple.  Skin:    General: Skin is warm and dry.     Capillary Refill: Capillary refill takes less than 2 seconds.  Neurological:     General: No focal deficit present.     Mental Status: He is alert.     Sensory: No sensory deficit.     Motor: No weakness.     ED Results / Procedures / Treatments   Labs (all labs ordered are listed, but only abnormal results are displayed) Labs Reviewed  COMPREHENSIVE METABOLIC PANEL - Abnormal; Notable for the following components:      Result Value   Sodium 129 (*)    Chloride 96 (*)    Glucose, Bld 101 (*)    Calcium 8.8 (*)    Total Protein 8.2 (*)    Albumin 2.8 (*)    All other components within normal limits  CBC WITH DIFFERENTIAL/PLATELET - Abnormal; Notable for the following components:   Hemoglobin 10.4 (*)    HCT 32.7 (*)    MCV 76.2 (*)    MCH 24.2 (*)    RDW 16.1 (*)    All other components within normal limits  CULTURE, BLOOD (ROUTINE X 2)  CULTURE, BLOOD (ROUTINE X 2)  LACTIC ACID, PLASMA  LACTIC ACID, PLASMA  URINALYSIS, W/ REFLEX TO CULTURE (INFECTION SUSPECTED)    EKG None  Radiology DG Chest 2 View  Result Date: 12/11/2022 CLINICAL DATA:  Cough. EXAM: CHEST - 2 VIEW COMPARISON:  December 09, 2022.  FINDINGS: The heart size and mediastinal contours are within normal limits. Status post transcatheter aortic valve repair. Both lungs are clear. The visualized skeletal structures are unremarkable. IMPRESSION: No active cardiopulmonary disease. Electronically Signed   By: Lupita Raider M.D.   On: 12/11/2022 16:15   CT CHEST ABDOMEN PELVIS WO CONTRAST  Result Date: 12/09/2022 CLINICAL DATA:  Pneumonia, complication suspected, xray done.  Fall. EXAM: CT CHEST, ABDOMEN AND PELVIS WITHOUT CONTRAST TECHNIQUE: Multidetector CT imaging of the chest, abdomen and pelvis was performed following the standard protocol without IV contrast. RADIATION DOSE REDUCTION: This exam was performed according to the departmental dose-optimization program which includes automated exposure control, adjustment of the mA and/or kV according to patient size and/or use of iterative reconstruction technique. COMPARISON:  09/20/2020 FINDINGS: CT CHEST FINDINGS Cardiovascular: Heart is normal size. Aorta is normal caliber. Prior TAVR coronary artery and aortic atherosclerosis. Mediastinum/Nodes: No mediastinal, hilar, or axillary adenopathy. Trachea and esophagus are unremarkable. Thyroid unremarkable. Lungs/Pleura: Lungs are clear. No focal airspace opacities or suspicious nodules. No effusions. Musculoskeletal: Chest wall soft tissues are unremarkable. No acute bony abnormality. CT ABDOMEN PELVIS FINDINGS Hepatobiliary: No focal liver abnormality is seen. Status post cholecystectomy. No biliary dilatation. Pancreas: No focal abnormality or ductal dilatation. Spleen: No focal abnormality.  Normal size. Adrenals/Urinary Tract: No adrenal abnormality. No focal renal abnormality. No stones or hydronephrosis. Urinary bladder is unremarkable. Stomach/Bowel: Large stool burden throughout the colon suggesting constipation. Gaseous distention of the colon may reflect ileus. Stomach and small bowel decompressed, unremarkable. Vascular/Lymphatic:  Aortic atherosclerosis. No evidence of aneurysm or adenopathy. Reproductive: Insert male pelvis Other: No free fluid or free air. Musculoskeletal: Mild compression fracture through the superior endplate of L1. This is new since 2022. IMPRESSION: Large stool burden throughout the colon compatible with constipation. Diffuse gaseous distention of the colon may reflect ileus. No acute cardiopulmonary disease. Mild compression fracture through the superior endplate of L1, age indeterminate but new since 2022. Coronary artery disease, aortic atherosclerosis. Electronically Signed  By: Charlett Nose M.D.   On: 12/09/2022 23:10   CT Head Wo Contrast  Result Date: 12/09/2022 CLINICAL DATA:  Fall hitting head on floor. Right forehead hematoma. EXAM: CT HEAD WITHOUT CONTRAST TECHNIQUE: Contiguous axial images were obtained from the base of the skull through the vertex without intravenous contrast. RADIATION DOSE REDUCTION: This exam was performed according to the departmental dose-optimization program which includes automated exposure control, adjustment of the mA and/or kV according to patient size and/or use of iterative reconstruction technique. COMPARISON:  Head CT 02/07/2022 FINDINGS: Brain: No intracranial hemorrhage, mass effect, or midline shift. Generalized atrophy with mild progression from prior. No hydrocephalus. The basilar cisterns are patent. Minimal periventricular chronic small vessel ischemic change. Encephalomalacia in the posteroinferior left frontal lobe is new from prior exam, but likely represents interval ischemia. No evidence of territorial infarct or acute ischemia. No extra-axial or intracranial fluid collection. Vascular: Atherosclerosis of skullbase vasculature without hyperdense vessel or abnormal calcification. Skull: No fracture or focal lesion. Sinuses/Orbits: Mucosal thickening throughout the ethmoid air cells, chronic. No acute findings. Other: Right frontal scalp hematoma. IMPRESSION: 1.  Right frontal scalp hematoma. No acute intracranial abnormality. No skull fracture. 2. Generalized atrophy and chronic small vessel ischemic change. Encephalomalacia in the posteroinferior left frontal lobe is new from September 2023 exam, likely represents interval ischemia. Electronically Signed   By: Narda Rutherford M.D.   On: 12/09/2022 22:21   DG Chest 2 View  Result Date: 12/09/2022 CLINICAL DATA:  Suspected sepsis. EXAM: CHEST - 2 VIEW COMPARISON:  Chest radiograph dated 02/07/2022. FINDINGS: No focal consolidation, pleural effusion, or pneumothorax. Top-normal cardiac size. Aortic valve repair. Atherosclerotic calcification of the aorta. No acute osseous pathology. IMPRESSION: No active cardiopulmonary disease. Electronically Signed   By: Elgie Collard M.D.   On: 12/09/2022 21:39    Procedures Procedures    Medications Ordered in ED Medications  0.9 %  sodium chloride infusion (0 mLs Intravenous Stopped 12/11/22 2032)  cefTRIAXone (ROCEPHIN) 2 g in sodium chloride 0.9 % 100 mL IVPB (0 g Intravenous Stopped 12/11/22 1822)    ED Course/ Medical Decision Making/ A&P Clinical Course as of 12/11/22 2119  Fri Dec 11, 2022  1621 Chest x-ray interpreted by me as no acute infiltrate.  Awaiting radiology reading. [MB]  1732 There is a note from infectious disease recommending IV antibiotics and will need an echo.  Given ceftriaxone 2 g.  Discussed with [facility] hospitalist Dr. Adrian Blackwater who will evaluate patient for admission and likely to [facility] [MB]    Clinical Course User Index [MB] Terrilee Files, MD                             Medical Decision Making Amount and/or Complexity of Data Reviewed Labs: ordered. Radiology: ordered.  Risk Decision regarding hospitalization.   This patient complains of positive blood cultures; this involves an extensive number of treatment Options and is a complaint that carries with it a high risk of complications and morbidity. The  differential includes bacteremia, sepsis, pneumonia, UTI, infected valve  I ordered, reviewed and interpreted labs, which included CBC with normal white count, hemoglobin low stable from priors, chemistries with low sodium normal LFTs, urinalysis without section, lactate normal, blood culture sent I ordered medication IV antibiotics and reviewed PMP when indicated. I ordered imaging studies which included chest x-ray and I independently    visualized and interpreted imaging which showed no acute infiltrate Additional  history obtained from patient's wife Previous records obtained and reviewed in epic including recent admission and recent ID notes I consulted [facility] hospitalist Dr. Adrian Blackwater and discussed lab and imaging findings and discussed disposition.  Cardiac monitoring reviewed, normal sinus rhythm Social determinants considered, no significant barriers Critical Interventions: None  After the interventions stated above, I reevaluated the patient and found patient to be awake alert nontoxic Admission and further testing considered, patient will be admitted for IV antibiotics.  Will need echo also so likely [facility] for admission.         Final Clinical Impression(s) / ED Diagnoses Final diagnoses:  Bacteremia    Rx / DC Orders ED Discharge Orders     None         Terrilee Files, MD 12/11/22 2122

## 2022-12-11 NOTE — ED Notes (Signed)
Numbers in chart called and message left by this RN for pt to return to ED due to positive blood cultures.

## 2022-12-11 NOTE — Telephone Encounter (Signed)
Pharmacy: Antimicrobial Stewardship Note  69 YOM who presented to [facility] on 7/11 s/p fall. Upon work-up was found to be febrile so blood cultures were drawn.   Both sets of blood cultures (4/4) from two separate site (L-forearm and R-forearm) are growing Strep infantarius. Since this is growing in two separate sites - would be considered a true pathogen and with history noted of TAVR - would require additional work-up.   Patient contacted and instructed to come back to [facility] for further evaluation. Would recommend repeat blood cultures prior to starting antibiotics and TTE to start initial work-up. ID team aware of the patient and can be contacted with additional questions.   Thank you for allowing pharmacy to be a part of this patient's care.  Georgina Pillion, PharmD, BCPS, BCIDP Infectious Diseases Clinical Pharmacist 12/11/2022 2:45 PM   **Pharmacist phone directory can now be found on [email or web address] ([email or web address]).  Listed under [facility].

## 2022-12-11 NOTE — ED Triage Notes (Signed)
Pt was called and instructed to come here due to positive results on his blood cultures. Positive for streptococcus infantarius.  Denies any fevers or chills, +HA

## 2022-12-12 ENCOUNTER — Inpatient Hospital Stay (HOSPITAL_COMMUNITY): Payer: Medicare HMO

## 2022-12-12 DIAGNOSIS — R7881 Bacteremia: Secondary | ICD-10-CM

## 2022-12-12 DIAGNOSIS — I4891 Unspecified atrial fibrillation: Secondary | ICD-10-CM

## 2022-12-12 DIAGNOSIS — B955 Unspecified streptococcus as the cause of diseases classified elsewhere: Secondary | ICD-10-CM

## 2022-12-12 DIAGNOSIS — I10 Essential (primary) hypertension: Secondary | ICD-10-CM | POA: Diagnosis not present

## 2022-12-12 LAB — ECHOCARDIOGRAM LIMITED
AR max vel: 1.33 cm2
AV Area VTI: 1.43 cm2
AV Area mean vel: 1.29 cm2
AV Mean grad: 16 mmHg
AV Peak grad: 30.5 mmHg
Ao pk vel: 2.76 m/s
Area-P 1/2: 2.54 cm2
S' Lateral: 3.2 cm
Weight: 2878.33 oz

## 2022-12-12 LAB — CBC
HCT: 32.2 % — ABNORMAL LOW (ref 39.0–52.0)
Hemoglobin: 10.1 g/dL — ABNORMAL LOW (ref 13.0–17.0)
MCH: 23.7 pg — ABNORMAL LOW (ref 26.0–34.0)
MCHC: 31.4 g/dL (ref 30.0–36.0)
MCV: 75.6 fL — ABNORMAL LOW (ref 80.0–100.0)
Platelets: 315 10*3/uL (ref 150–400)
RBC: 4.26 MIL/uL (ref 4.22–5.81)
RDW: 15.9 % — ABNORMAL HIGH (ref 11.5–15.5)
WBC: 7.1 10*3/uL (ref 4.0–10.5)
nRBC: 0 % (ref 0.0–0.2)

## 2022-12-12 LAB — BASIC METABOLIC PANEL
Anion gap: 9 (ref 5–15)
BUN: 5 mg/dL — ABNORMAL LOW (ref 8–23)
CO2: 23 mmol/L (ref 22–32)
Calcium: 8.9 mg/dL (ref 8.9–10.3)
Chloride: 99 mmol/L (ref 98–111)
Creatinine, Ser: 0.68 mg/dL (ref 0.61–1.24)
GFR, Estimated: >60 mL/min (ref >60)
Glucose, Bld: 89 mg/dL (ref 70–99)
Potassium: 4 mmol/L (ref 3.5–5.1)
Sodium: 131 mmol/L — ABNORMAL LOW (ref 135–145)

## 2022-12-12 LAB — CULTURE, BLOOD (ROUTINE X 2): Special Requests: ADEQUATE

## 2022-12-12 LAB — GLUCOSE, CAPILLARY
Glucose-Capillary: 74 mg/dL (ref 70–99)
Glucose-Capillary: 102 mg/dL — ABNORMAL HIGH (ref 70–99)
Glucose-Capillary: 89 mg/dL (ref 70–99)
Glucose-Capillary: 123 mg/dL — ABNORMAL HIGH (ref 70–99)

## 2022-12-12 NOTE — Progress Notes (Signed)
  Echocardiogram 2D Echocardiogram has been performed.  [name] 12/12/2022, 2:09 PM

## 2022-12-12 NOTE — Progress Notes (Signed)
PROGRESS NOTE    JETLI LAUBSCHER  ZOX:[id number] DOB: 08-05-1950 DOA: 12/11/2022 PCP: [name], MD    Chief Complaint  Patient presents with   Abnormal labs    Brief Narrative:    [name] is a 72 y.o. male with medical history significant of history of TAVR, COPD, mild dementia, GERD, history of CVA, diabetes, hypertension, atrial fibrillation on anticoagulation.  Patient presents after being hospitalized overnight due to fever.  He was discharged home.  His blood cultures turned positive for Streptococcus infantarius.  He otherwise feels fine and without any more problems.  He also relates a history of falling.  He falls once twice a week where he loses his balance because he feels lightheaded.  He does not know whether this is the result of being in atrial fibrillation or whether there is something else going on.  This has been going on for the past few months.  He has been referred to cardiology and neurology for evaluation of his falls.  He currently denies fevers, chills, nausea, vomiting, abdominal pain.  He denies chest pain or shortness of breath.   Assessment & Plan:  Principal Problem:   Bacteremia Active Problems:   Diabetes mellitus type 2, controlled, with complications (HCC)   Hypertensive heart disease with chronic diastolic congestive heart failure (HCC)   GERD   COPD mixed type (HCC)   S/P TAVR (transcatheter aortic valve replacement)   Essential hypertension   Acquired hypothyroidism   Streptococcus bacteremia -so far Clear etiology -Continue with IV Rocephin -Follow on repeat blood cultures -The echo is pending. -Discussed with cardiology, they will arrange for TEE, likely on Monday -ID consult is pending  Falls -Continue with PT/OT  Hypertension - Continue blood pressure medications  History of TAVR  COPD - Continue long-acting inhalers  Essential hypertension - Continue Coreg, losartan   Mixed hyperlipidemia - Continue  Crestor   Persistent atrial fibrillation - Continue Coreg, Eliquis   Acquired hypothyroidism - Continue Synthroid  Hyponatremia - improving , continue with IV fluid    DVT prophylaxis: Eliquis Code Status: Full Family Communication: None at bedside Disposition:   Status is: Inpatient  Consultants:  ID   Subjective:  No significant events overnight, he denies any chest pain, shortness of breath, he reports generalized weakness and fatigue  Objective: Vitals:   12/11/22 2300 12/12/22 0300 12/12/22 0400 12/12/22 0800  BP: (!) 156/86 133/72 133/72 (!) 158/70  Pulse: 69  (!) 54 (!) 56  Resp: 11 12 11 11   Temp: 98.6 F (37 C) 98.2 F (36.8 C)  97.7 F (36.5 C)  TempSrc: Oral Oral  Oral  SpO2: 100%  99% 100%  Weight:        Intake/Output Summary (Last 24 hours) at 12/12/2022 1149 Last data filed at 12/12/2022 8119 Gross per 24 hour  Intake 1038.76 ml  Output 850 ml  Net 188.76 ml   Filed Weights   12/11/22 2100  Weight: 81.6 kg    Examination:  Awake Alert, Oriented X 3, frail Symmetrical Chest wall movement, Good air movement bilaterally, CTAB RRR,No Gallops,Rubs or new Murmurs, No Parasternal Heave +ve B.Sounds, Abd Soft, No tenderness, No rebound - guarding or rigidity. No Cyanosis, Clubbing or edema, No new Rash or bruise       Data Reviewed: I have personally reviewed following labs and imaging studies  CBC: Recent Labs  Lab 12/09/22 2056 12/10/22 0623 12/11/22 1649 12/12/22 0535  WBC 15.4* 10.6* 9.6 7.1  NEUTROABS  13.1*  --  6.7  --   HGB 10.7* 9.8* 10.4* 10.1*  HCT 33.2* 30.5* 32.7* 32.2*  MCV 74.8* 75.1* 76.2* 75.6*  PLT 318 271 338 315    Basic Metabolic Panel: Recent Labs  Lab 12/09/22 2056 12/10/22 0623 12/11/22 1649 12/12/22 0535  NA 125* 126* 129* 131*  K 4.0 3.5 4.0 4.0  CL 94* 97* 96* 99  CO2 20* 23 25 23   GLUCOSE 109* 110* 101* 89  BUN 14 13 9  5*  CREATININE 0.81 0.73 0.78 0.68  CALCIUM 8.7* 8.3* 8.8* 8.9  MG  --   1.9  --   --   PHOS  --  4.1  --   --     GFR: Estimated Creatinine Clearance: 94.3 mL/min (by C-G formula based on SCr of 0.68 mg/dL).  Liver Function Tests: Recent Labs  Lab 12/09/22 2056 12/10/22 0623 12/11/22 1649  AST 18 13* 17  ALT 15 12 14   ALKPHOS 91 75 88  BILITOT 1.0 1.0 0.6  PROT 8.0 7.0 8.2*  ALBUMIN 2.8* 2.3* 2.8*    CBG: Recent Labs  Lab 12/11/22 2203 12/12/22 0823  GLUCAP 80 74     Recent Results (from the past 240 hour(s))  Culture, blood (Routine x 2)     Status: Abnormal   Collection Time: 12/09/22  8:56 PM   Specimen: BLOOD RIGHT FOREARM  Result Value Ref Range Status   Specimen Description   Final    BLOOD RIGHT FOREARM Performed at [facility], [address], [place], [place] [address]    Special Requests   Final    BOTTLES DRAWN AEROBIC AND ANAEROBIC Blood Culture results may not be optimal due to an excessive volume of blood received in culture bottles Performed at [facility], [address], [place], [place] [address]    Culture  Setup Time   Final    GRAM POSITIVE COCCI IN BOTH AEROBIC AND ANAEROBIC BOTTLES Gram Stain Report Called to,Read Back By and Verified With: [facility] @ 1517 ON 12/10/2022 BY FRATTO,ASHLEY CRITICAL RESULT CALLED TO, READ BACK BY AND VERIFIED WITH: RN Marisa Hua [phone] @2242  FH Performed at [facility], [address], [place], [place] [address]    Culture STREPTOCOCCUS INFANTARIUS (A)  Final   Report Status 12/12/2022 FINAL  Final   Organism ID, Bacteria STREPTOCOCCUS INFANTARIUS  Final      Susceptibility   Streptococcus infantarius - MIC*    ERYTHROMYCIN <=0.12 SENSITIVE Sensitive     TETRACYCLINE <=0.25 SENSITIVE Sensitive     VANCOMYCIN 0.5 SENSITIVE Sensitive     CLINDAMYCIN <=0.25 SENSITIVE Sensitive     * STREPTOCOCCUS INFANTARIUS  Blood Culture ID Panel (Reflexed)     Status: Abnormal   Collection Time: 12/09/22  8:56 PM  Result Value Ref Range Status   Enterococcus faecalis NOT DETECTED  NOT DETECTED Final   Enterococcus Faecium NOT DETECTED NOT DETECTED Final   Listeria monocytogenes NOT DETECTED NOT DETECTED Final   Staphylococcus species NOT DETECTED NOT DETECTED Final   Staphylococcus aureus (BCID) NOT DETECTED NOT DETECTED Final   Staphylococcus epidermidis NOT DETECTED NOT DETECTED Final   Staphylococcus lugdunensis NOT DETECTED NOT DETECTED Final   Streptococcus species DETECTED (A) NOT DETECTED Final    Comment: Not Enterococcus species, Streptococcus agalactiae, Streptococcus pyogenes, or Streptococcus pneumoniae. CRITICAL RESULT CALLED TO, READ BACK BY AND VERIFIED WITH: RN C. TURNER [phone] @2242  FH    Streptococcus agalactiae NOT DETECTED NOT DETECTED Final   Streptococcus  pneumoniae NOT DETECTED NOT DETECTED Final   Streptococcus pyogenes NOT DETECTED NOT DETECTED Final   A.calcoaceticus-baumannii NOT DETECTED NOT DETECTED Final   Bacteroides fragilis NOT DETECTED NOT DETECTED Final   Enterobacterales NOT DETECTED NOT DETECTED Final   Enterobacter cloacae complex NOT DETECTED NOT DETECTED Final   Escherichia coli NOT DETECTED NOT DETECTED Final   Klebsiella aerogenes NOT DETECTED NOT DETECTED Final   Klebsiella oxytoca NOT DETECTED NOT DETECTED Final   Klebsiella pneumoniae NOT DETECTED NOT DETECTED Final   Proteus species NOT DETECTED NOT DETECTED Final   Salmonella species NOT DETECTED NOT DETECTED Final   Serratia marcescens NOT DETECTED NOT DETECTED Final   Haemophilus influenzae NOT DETECTED NOT DETECTED Final   Neisseria meningitidis NOT DETECTED NOT DETECTED Final   Pseudomonas aeruginosa NOT DETECTED NOT DETECTED Final   Stenotrophomonas maltophilia NOT DETECTED NOT DETECTED Final   Candida albicans NOT DETECTED NOT DETECTED Final   Candida auris NOT DETECTED NOT DETECTED Final   Candida glabrata NOT DETECTED NOT DETECTED Final   Candida krusei NOT DETECTED NOT DETECTED Final   Candida parapsilosis NOT DETECTED NOT DETECTED Final   Candida  tropicalis NOT DETECTED NOT DETECTED Final   Cryptococcus neoformans/gattii NOT DETECTED NOT DETECTED Final    Comment: Performed at [facility], [address] [place], [place] [address]  Culture, blood (Routine x 2)     Status: Abnormal   Collection Time: 12/09/22  9:25 PM   Specimen: BLOOD LEFT FOREARM  Result Value Ref Range Status   Specimen Description   Final    BLOOD LEFT FOREARM Performed at [facility], [address] [place], [place] [address]    Special Requests   Final    BOTTLES DRAWN AEROBIC AND ANAEROBIC Blood Culture adequate volume Performed at [facility], [address] [place], [place] [address]    Culture  Setup Time   Final    GRAM POSITIVE COCCI IN BOTH AEROBIC AND ANAEROBIC BOTTLES Gram Stain Report Called to,Read Back By and Verified With: TURNER, CHARLOTTE @ 1618 ON 12/10/2022 BY Yehuda Savannah Performed at [facility], [address], [place], [place] [address]    Culture (A)  Final    STREPTOCOCCUS INFANTARIUS SUSCEPTIBILITIES PERFORMED ON PREVIOUS CULTURE WITHIN THE LAST 5 DAYS. Performed at [facility], [address], [place], [place] [address]    Report Status 12/12/2022 FINAL  Final  Resp panel by RT-PCR (RSV, Flu A&B, Covid) Anterior Nasal Swab     Status: None   Collection Time: 12/09/22 10:43 PM   Specimen: Anterior Nasal Swab  Result Value Ref Range Status   SARS Coronavirus 2 by RT PCR NEGATIVE NEGATIVE Final    Comment: (NOTE) SARS-CoV-2 target nucleic acids are NOT DETECTED.  The SARS-CoV-2 RNA is generally detectable in upper respiratory specimens during the acute phase of infection. The lowest concentration of SARS-CoV-2 viral copies this assay can detect is 138 copies/mL. A negative result does not preclude SARS-Cov-2 infection and should not be used as the sole basis for treatment or other patient management decisions. A negative result may occur with  improper specimen collection/handling, submission of  specimen other than nasopharyngeal swab, presence of viral mutation(s) within the areas targeted by this assay, and inadequate number of viral copies(<138 copies/mL). A negative result must be combined with clinical observations, patient history, and epidemiological information. The expected result is Negative.  Fact Sheet for Patients:  BloggerCourse.com  Fact Sheet for Healthcare Providers:  SeriousBroker.it  This test is no t yet approved or cleared by the [place] and  has been authorized for detection and/or diagnosis of SARS-CoV-2 by FDA under an Emergency Use Authorization (EUA). This EUA will remain  in effect (meaning this test can be used) for the duration of the COVID-19 declaration under Section 564(b)(1) of the Act, 21 U.S.C.section 360bbb-3(b)(1), unless the authorization is terminated  or revoked sooner.       Influenza A by PCR NEGATIVE NEGATIVE Final   Influenza B by PCR NEGATIVE NEGATIVE Final    Comment: (NOTE) The Xpert Xpress SARS-CoV-2/FLU/RSV plus assay is intended as an aid in the diagnosis of influenza from Nasopharyngeal swab specimens and should not be used as a sole basis for treatment. Nasal washings and aspirates are unacceptable for Xpert Xpress SARS-CoV-2/FLU/RSV testing.  Fact Sheet for Patients: BloggerCourse.com  Fact Sheet for Healthcare Providers: SeriousBroker.it  This test is not yet approved or cleared by the [place] FDA and has been authorized for detection and/or diagnosis of SARS-CoV-2 by FDA under an Emergency Use Authorization (EUA). This EUA will remain in effect (meaning this test can be used) for the duration of the COVID-19 declaration under Section 564(b)(1) of the Act, 21 U.S.C. section 360bbb-3(b)(1), unless the authorization is terminated or revoked.     Resp Syncytial Virus by PCR NEGATIVE NEGATIVE Final     Comment: (NOTE) Fact Sheet for Patients: BloggerCourse.com  Fact Sheet for Healthcare Providers: SeriousBroker.it  This test is not yet approved or cleared by the [place] FDA and has been authorized for detection and/or diagnosis of SARS-CoV-2 by FDA under an Emergency Use Authorization (EUA). This EUA will remain in effect (meaning this test can be used) for the duration of the COVID-19 declaration under Section 564(b)(1) of the Act, 21 U.S.C. section 360bbb-3(b)(1), unless the authorization is terminated or revoked.  Performed at [facility], [address], [place], [place] [address]   Culture, blood (routine x 2)     Status: None (Preliminary result)   Collection Time: 12/11/22  4:49 PM   Specimen: Left Antecubital; Blood  Result Value Ref Range Status   Specimen Description LEFT ANTECUBITAL  Final   Special Requests   Final    BOTTLES DRAWN AEROBIC AND ANAEROBIC Blood Culture adequate volume   Culture   Final    NO GROWTH < 24 HOURS Performed at [facility], [address], [place], [place] [address]    Report Status PENDING  Incomplete  Culture, blood (routine x 2)     Status: None (Preliminary result)   Collection Time: 12/11/22  4:49 PM   Specimen: Right Antecubital; Blood  Result Value Ref Range Status   Specimen Description RIGHT ANTECUBITAL  Final   Special Requests   Final    BOTTLES DRAWN AEROBIC AND ANAEROBIC Blood Culture adequate volume   Culture   Final    NO GROWTH < 24 HOURS Performed at [facility], [address] [place], [place] [address]    Report Status PENDING  Incomplete         Radiology Studies: DG Chest 2 View  Result Date: 12/11/2022 CLINICAL DATA:  Cough. EXAM: CHEST - 2 VIEW COMPARISON:  December 09, 2022. FINDINGS: The heart size and mediastinal contours are within normal limits. Status post transcatheter aortic valve repair. Both lungs are clear. The visualized skeletal  structures are unremarkable. IMPRESSION: No active cardiopulmonary disease. Electronically Signed   By: Lupita Raider M.D.   On: 12/11/2022 16:15  Scheduled Meds:  apixaban  5 mg Oral BID   carvedilol  25 mg Oral BID WC   fluticasone furoate-vilanterol  1 puff Inhalation Daily   insulin aspart  0-5 Units Subcutaneous QHS   insulin aspart  0-9 Units Subcutaneous TID WC   [START ON 12/13/2022] levothyroxine  25 mcg Oral Daily   losartan  100 mg Oral Daily   rosuvastatin  20 mg Oral Daily   sertraline  150 mg Oral Daily   spironolactone  12.5 mg Oral Daily   Continuous Infusions:  sodium chloride 100 mL/hr at 12/12/22 1012   cefTRIAXone (ROCEPHIN)  IV       LOS: 1 day     Huey Bienenstock, MD [facility]   To contact the attending provider between 7A-7P or the covering provider during after hours 7P-7A, please log into the web site [email or web address] and access using universal [place] password for that web site. If you do not have the password, please call the hospital operator.  12/12/2022, 11:49 AM

## 2022-12-12 NOTE — Evaluation (Signed)
Physical Therapy Evaluation Patient Details Name: [name] MRN: [id number] DOB: 12-23-1950 Today's Date: 12/12/2022  History of Present Illness  Pt is a 72 y.o. male admitted s/p fall and recent + blood cultures for  Streptococcus infantarius.  Pt with PMH significant of history of TAVR, COPD, mild dementia, GERD, history of CVA, diabetes, hypertension, atrial fibrillation on anticoagulation.  Patient with recent admission due to fever. He also relates a history of falling.  He falls once twice a week where he loses his balance because he feels lightheaded. This has been going on for the past few months.  He has been referred to cardiology and neurology for evaluation of his falls.  Clinical Impression  Patient presents with mild dependencies in gait and mobility.  Patient overall requiring min-guard assist for safety due to history of falls.  Patient with no dizziness or loss of balance this session.  Pt will benefit from continued PT for strengthening, gait/mobility assessment to address frequency of falls.          Assistance Recommended at Discharge PRN  If plan is discharge home, recommend the following:  Can travel by private vehicle  A little help with walking and/or transfers;A little help with bathing/dressing/bathroom        Equipment Recommendations None recommended by PT  Recommendations for Other Services       Functional Status Assessment Patient has had a recent decline in their functional status and demonstrates the ability to make significant improvements in function in a reasonable and predictable amount of time.     Precautions / Restrictions Precautions Precautions: Fall      Mobility  Bed Mobility Overal bed mobility: Modified Independent             General bed mobility comments: moves slowly, used railing    Transfers Overall transfer level: Modified independent Equipment used: Rolling walker (2 wheels)                     Ambulation/Gait Ambulation/Gait assistance: Min guard Gait Distance (Feet): 60 Feet Assistive device: Rolling walker (2 wheels) Gait Pattern/deviations: Step-through pattern, Trunk flexed Gait velocity: slow pace        Stairs            Wheelchair Mobility     Tilt Bed    Modified Rankin (Stroke Patients Only)       Balance Overall balance assessment: Needs assistance Sitting-balance support: Feet supported, No upper extremity supported Sitting balance-Leahy Scale: Good     Standing balance support: Bilateral upper extremity supported, Reliant on assistive device for balance Standing balance-Leahy Scale: Poor                               Pertinent Vitals/Pain Pain Assessment Pain Assessment: No/denies pain    Home Living Family/patient expects to be discharged to:: Private residence Living Arrangements: Spouse/significant other Available Help at Discharge: Family Type of Home: House Home Access: Ramped entrance       Home Layout: One level Home Equipment: Agricultural consultant (2 wheels);Rollator (4 wheels)      Prior Function Prior Level of Function : Independent/Modified Independent             Mobility Comments: ambulates with RW and has frequent falls       Hand Dominance        Extremity/Trunk Assessment   Upper Extremity Assessment Upper Extremity Assessment: Generalized weakness  Lower Extremity Assessment Lower Extremity Assessment: Generalized weakness    Cervical / Trunk Assessment Cervical / Trunk Assessment: Kyphotic  Communication   Communication: No difficulties  Cognition Arousal/Alertness: Awake/alert Behavior During Therapy: WFL for tasks assessed/performed Overall Cognitive Status: Within Functional Limits for tasks assessed                                          General Comments      Exercises     Assessment/Plan    PT Assessment Patient needs continued PT services  PT  Problem List Decreased strength;Decreased activity tolerance;Decreased balance;Decreased mobility       PT Treatment Interventions Gait training;Functional mobility training;Therapeutic activities;Therapeutic exercise;Balance training;Patient/family education    PT Goals (Current goals can be found in the Care Plan section)  Acute Rehab PT Goals Patient Stated Goal: figure out why I fall PT Goal Formulation: With patient Time For Goal Achievement: 12/26/22 Potential to Achieve Goals: Good    Frequency Min 2X/week     Co-evaluation               AM-PAC PT "6 Clicks" Mobility  Outcome Measure Help needed turning from your back to your side while in a flat bed without using bedrails?: A Little Help needed moving from lying on your back to sitting on the side of a flat bed without using bedrails?: A Little Help needed moving to and from a bed to a chair (including a wheelchair)?: A Little Help needed standing up from a chair using your arms (e.g., wheelchair or bedside chair)?: None Help needed to walk in hospital room?: A Little Help needed climbing 3-5 steps with a railing? : A Little 6 Click Score: 19    End of Session Equipment Utilized During Treatment: Gait belt Activity Tolerance: Patient tolerated treatment well;Patient limited by fatigue Patient left: in bed;with call bell/phone within reach   PT Visit Diagnosis: Repeated falls (R29.6);Other abnormalities of gait and mobility (R26.89);History of falling (Z91.81);Unsteadiness on feet (R26.81)    Time: 1610-9604 PT Time Calculation (min) (ACUTE ONLY): 22 min   Charges:   PT Evaluation $PT Eval Moderate Complexity: 1 Mod   PT General Charges $$ ACUTE PT VISIT: 1 Visit         12/12/2022 [name], PT Acute Rehabilitation Services Office:  [phone]   [name] 12/12/2022, 10:36 AM

## 2022-12-12 NOTE — Consult Note (Signed)
[facility]  Total days of antibiotic 4 Reason for Consult: streptococcal bacteremia   Referring Physician: [email or web address]  Principal Problem:   Bacteremia Active Problems:   Diabetes mellitus type 2, controlled, with complications (HCC)   Hypertensive heart disease with chronic diastolic congestive heart failure (HCC)   GERD   COPD mixed type (HCC)   S/P TAVR (transcatheter aortic valve replacement)   Essential hypertension   Acquired hypothyroidism    HPI: [name] is a 72 y.o. male with hx of htn, dHF, hx of TAVR, T2DM, afib with anticoagulation, admitted for fevers up to 101F, infectious work up revealed streptococcal infantarius and leukocytosis. He was started on cefepime but then narrowed to ceftriaxone. He is no longer febrile nor has leukocytosis anymore. He denies fevers, chills, nausea, vomiting, abdominal pain.  He denies chest pain or shortness of breath. Prior to hospitalization, did have episodes of falls.     Past Medical History:  Diagnosis Date   Anemia    Asthma    Back pain, chronic, followed at pain clinic 11/25/2011   Carotid artery stenosis 09/06/2014   By Dopplers in September 2017: Right ICA 40-59%.  Less than 40% left ICA.   Colon polyps    COPD (chronic obstructive pulmonary disease) (HCC)    Dementia (HCC)    Mild   Depression    Dyslipidemia (high LDL; low HDL)    statin intolerant; on fibrate   Edema leg from Venous Stasis    Venous stasis :wears compression stockings; 08/2012 dopplers - no DVT or thrombophlebitis; mild R Popliteal V reflux - no VNUS ablation candidate   GERD (gastroesophageal reflux disease)    Hearing loss    History of CVA (cerebrovascular accident)    History of diabetes mellitus    History of DVT (deep vein thrombosis)    Hypertension    very labile   Nephrolithiasis    Osteoarthritis    Other idiopathic peripheral autonomic neuropathy    Agent orange   PAF (paroxysmal atrial  fibrillation) (HCC)    PONV (postoperative nausea and vomiting)    PTSD (post-traumatic stress disorder) 07/24/2015   per patient approach from foot of bed to awake; do not apply any contricting pressure, also avoid approaching from behind with any loud noises    S/P TAVR (transcatheter aortic valve replacement) 08/08/2019   s/p TAVR w/ a 29 mm Edwards Sapien 3 THV via the TF approach by Drs Excell Seltzer and Cornelius Moras   Seizure [facility])    Severe aortic stenosis    Skull fracture (HCC)    Small bowel obstruction (HCC) 1990s, 2001, 2015   s/p multiple bowel surgeries; from war wounds    Allergies:  Allergies  Allergen Reactions   Bee Venom Anaphylaxis   Bystolic [Nebivolol Hcl] Other (See Comments)    Nightmares, flashbacks (PTSD)   Codeine Shortness Of Breath and Rash   Doxycycline Shortness Of Breath and Rash   Iodinated Contrast Media Shortness Of Breath and Anaphylaxis    Lost consciousness/difficulty breathing Omni - Paque Contrast    Iohexol Hives, Shortness Of Breath and Rash    Neck and Torso erythemia IVP-DYE >> SHORTNESS OF BREATH    Levaquin [Levofloxacin] Hives and Shortness Of Breath   Oxycodone Hcl Shortness Of Breath, Swelling and Rash   Stadol [Butorphanol] Shortness Of Breath   Succinylcholine Chloride Shortness Of Breath, Nausea Only, Rash and Other (See Comments)    Difficulty breathing    Tamiflu [Oseltamivir Phosphate]  UNSPECIFIED REACTION    Atorvastatin     myalgias   Synvisc [Hylan G-F 20] Hives and Rash   Gemfibrozil     Other reaction(s): Serum cholesterol raised   Latex Other (See Comments)    blister   Lisinopril     Other reaction(s): Cough   Omeprazole    Dilaudid [Hydromorphone Hcl] Nausea Only and Rash    Aggressive    Methocarbamol Nausea Only and Rash   Niacin Rash   Pentazocine Lactate Rash    rash    Current antibiotics:   MEDICATIONS:  apixaban  5 mg Oral BID   carvedilol  25 mg Oral BID WC   fluticasone furoate-vilanterol  1 puff  Inhalation Daily   insulin aspart  0-5 Units Subcutaneous QHS   insulin aspart  0-9 Units Subcutaneous TID WC   [START ON 12/13/2022] levothyroxine  25 mcg Oral Daily   losartan  100 mg Oral Daily   rosuvastatin  20 mg Oral Daily   sertraline  150 mg Oral Daily   spironolactone  12.5 mg Oral Daily    Social History   Tobacco Use   Smoking status: Never   Smokeless tobacco: Former  Building services engineer status: Never Used  Substance Use Topics   Alcohol use: Yes    Comment: occasional beer    Drug use: No    Family History  Problem Relation Age of Onset   Stroke Mother    Heart disease Mother    Heart attack Mother    Heart disease Father    Heart attack Father    Heart attack Sister    Heart attack Brother    Hypertension Brother    Heart attack Son    Stroke Son    Hypertension Brother    Sleep apnea Son    Non-Hodgkin's lymphoma Daughter     Review of Systems -  12 point ros is negative except what is mentioned in hpi  OBJECTIVE: Temp:  [97.7 F (36.5 C)-98.7 F (37.1 C)] 98.1 F (36.7 C) (07/13 1200) Pulse Rate:  [54-79] 56 (07/13 1200) Resp:  [11-16] 13 (07/13 1200) BP: (133-193)/(70-112) 134/71 (07/13 1200) SpO2:  [94 %-100 %] 100 % (07/13 1200) Weight:  [81.6 kg] 81.6 kg (07/12 2100) Physical Exam  Constitutional: He is oriented to person, place, and time. He appears well-developed and well-nourished. No distress.  HENT:  Mouth/Throat: Oropharynx is clear and moist. No oropharyngeal exudate.  Cardiovascular: Normal rate, regular rhythm and normal heart sounds. Exam reveals no gallop and no friction rub.  No murmur heard.  Pulmonary/Chest: Effort normal and breath sounds normal. No respiratory distress. He has no wheezes.  Abdominal: Soft. Bowel sounds are normal. He exhibits no distension. There is no tenderness.  Lymphadenopathy:  He has no cervical adenopathy.  Neurological: He is alert and oriented to person, place, and time.  Skin: Skin is warm  and dry. No rash noted. No erythema.  Psychiatric: He has a normal mood and affect. His behavior is normal.    LABS: Results for orders placed or performed during the hospital encounter of 12/11/22 (from the past 48 hour(s))  Comprehensive metabolic panel     Status: Abnormal   Collection Time: 12/11/22  4:49 PM  Result Value Ref Range   Sodium 129 (L) 135 - 145 mmol/L   Potassium 4.0 3.5 - 5.1 mmol/L   Chloride 96 (L) 98 - 111 mmol/L   CO2 25 22 - 32 mmol/L  Glucose, Bld 101 (H) 70 - 99 mg/dL    Comment: Glucose reference range applies only to samples taken after fasting for at least 8 hours.   BUN 9 8 - 23 mg/dL   Creatinine, Ser 4.09 0.61 - 1.24 mg/dL   Calcium 8.8 (L) 8.9 - 10.3 mg/dL   Total Protein 8.2 (H) 6.5 - 8.1 g/dL   Albumin 2.8 (L) 3.5 - 5.0 g/dL   AST 17 15 - 41 U/L   ALT 14 0 - 44 U/L   Alkaline Phosphatase 88 38 - 126 U/L   Total Bilirubin 0.6 0.3 - 1.2 mg/dL   GFR, Estimated >81 >19 mL/min    Comment: (NOTE) Calculated using the CKD-EPI Creatinine Equation (2021)    Anion gap 8 5 - 15    Comment: Performed at [facility], [address] [place], [place] [address]  Lactic acid, plasma     Status: None   Collection Time: 12/11/22  4:49 PM  Result Value Ref Range   Lactic Acid, Venous 0.9 0.5 - 1.9 mmol/L    Comment: Performed at [facility], [address], [place], [place] [address]  CBC with Differential     Status: Abnormal   Collection Time: 12/11/22  4:49 PM  Result Value Ref Range   WBC 9.6 4.0 - 10.5 K/uL   RBC 4.29 4.22 - 5.81 MIL/uL   Hemoglobin 10.4 (L) 13.0 - 17.0 g/dL   HCT 30.8 (L) 65.7 - 84.6 %   MCV 76.2 (L) 80.0 - 100.0 fL   MCH 24.2 (L) 26.0 - 34.0 pg   MCHC 31.8 30.0 - 36.0 g/dL   RDW 96.2 (H) 95.2 - 84.1 %   Platelets 338 150 - 400 K/uL   nRBC 0.0 0.0 - 0.2 %   Neutrophils Relative % 69 %   Neutro Abs 6.7 1.7 - 7.7 K/uL   Lymphocytes Relative 18 %   Lymphs Abs 1.7 0.7 - 4.0 K/uL   Monocytes Relative 8 %   Monocytes Absolute 0.7  0.1 - 1.0 K/uL   Eosinophils Relative 4 %   Eosinophils Absolute 0.4 0.0 - 0.5 K/uL   Basophils Relative 1 %   Basophils Absolute 0.1 0.0 - 0.1 K/uL   Immature Granulocytes 0 %   Abs Immature Granulocytes 0.03 0.00 - 0.07 K/uL    Comment: Performed at [facility], [address], [place], [place] [address]  Culture, blood (routine x 2)     Status: None (Preliminary result)   Collection Time: 12/11/22  4:49 PM   Specimen: Left Antecubital; Blood  Result Value Ref Range   Specimen Description LEFT ANTECUBITAL    Special Requests      BOTTLES DRAWN AEROBIC AND ANAEROBIC Blood Culture adequate volume   Culture      NO GROWTH < 24 HOURS Performed at [facility], [address] [place], [place] [address]    Report Status PENDING   Culture, blood (routine x 2)     Status: None (Preliminary result)   Collection Time: 12/11/22  4:49 PM   Specimen: Right Antecubital; Blood  Result Value Ref Range   Specimen Description RIGHT ANTECUBITAL    Special Requests      BOTTLES DRAWN AEROBIC AND ANAEROBIC Blood Culture adequate volume   Culture      NO GROWTH < 24 HOURS Performed at [facility], [address] [place], [place] [address]    Report Status PENDING   Lactic acid, plasma     Status:  None   Collection Time: 12/11/22  6:40 PM  Result Value Ref Range   Lactic Acid, Venous 1.0 0.5 - 1.9 mmol/L    Comment: Performed at [facility], [address], [place], [place] [address]  Urinalysis, w/ Reflex to Culture (Infection Suspected) -Urine, Clean Catch     Status: None   Collection Time: 12/11/22  7:50 PM  Result Value Ref Range   Specimen Source URINE, CLEAN CATCH    Color, Urine YELLOW YELLOW   APPearance CLEAR CLEAR   Specific Gravity, Urine 1.014 1.005 - 1.030   pH 6.0 5.0 - 8.0   Glucose, UA NEGATIVE NEGATIVE mg/dL   Hgb urine dipstick NEGATIVE NEGATIVE   Bilirubin Urine NEGATIVE NEGATIVE   Ketones, ur NEGATIVE NEGATIVE mg/dL   Protein, ur NEGATIVE NEGATIVE mg/dL   Nitrite  NEGATIVE NEGATIVE   Leukocytes,Ua NEGATIVE NEGATIVE   RBC / HPF 0-5 0 - 5 RBC/hpf   WBC, UA 0-5 0 - 5 WBC/hpf    Comment:        Reflex urine culture not performed if WBC <=10, OR if Squamous epithelial cells >5. If Squamous epithelial cells >5 suggest recollection.    Bacteria, UA NONE SEEN NONE SEEN   Squamous Epithelial / HPF 0-5 0 - 5 /HPF   Mucus PRESENT     Comment: Performed at [facility], [address], [place], [place] [address]  Glucose, capillary     Status: None   Collection Time: 12/11/22 10:03 PM  Result Value Ref Range   Glucose-Capillary 80 70 - 99 mg/dL    Comment: Glucose reference range applies only to samples taken after fasting for at least 8 hours.  Basic metabolic panel     Status: Abnormal   Collection Time: 12/12/22  5:35 AM  Result Value Ref Range   Sodium 131 (L) 135 - 145 mmol/L   Potassium 4.0 3.5 - 5.1 mmol/L   Chloride 99 98 - 111 mmol/L   CO2 23 22 - 32 mmol/L   Glucose, Bld 89 70 - 99 mg/dL    Comment: Glucose reference range applies only to samples taken after fasting for at least 8 hours.   BUN 5 (L) 8 - 23 mg/dL   Creatinine, Ser 0.98 0.61 - 1.24 mg/dL   Calcium 8.9 8.9 - 11.9 mg/dL   GFR, Estimated >14 >78 mL/min    Comment: (NOTE) Calculated using the CKD-EPI Creatinine Equation (2021)    Anion gap 9 5 - 15    Comment: Performed at [facility], [address], [place], [place] [address]  CBC     Status: Abnormal   Collection Time: 12/12/22  5:35 AM  Result Value Ref Range   WBC 7.1 4.0 - 10.5 K/uL   RBC 4.26 4.22 - 5.81 MIL/uL   Hemoglobin 10.1 (L) 13.0 - 17.0 g/dL   HCT 13.0 (L) 86.5 - 78.4 %   MCV 75.6 (L) 80.0 - 100.0 fL   MCH 23.7 (L) 26.0 - 34.0 pg   MCHC 31.4 30.0 - 36.0 g/dL   RDW 69.6 (H) 29.5 - 28.4 %   Platelets 315 150 - 400 K/uL   nRBC 0.0 0.0 - 0.2 %    Comment: Performed at [facility], [address], [place], [place] [address]  Glucose, capillary     Status: None   Collection Time: 12/12/22  8:23  AM  Result Value Ref Range   Glucose-Capillary 74 70 - 99 mg/dL  Comment: Glucose reference range applies only to samples taken after fasting for at least 8 hours.  Glucose, capillary     Status: Abnormal   Collection Time: 12/12/22 11:49 AM  Result Value Ref Range   Glucose-Capillary 102 (H) 70 - 99 mg/dL    Comment: Glucose reference range applies only to samples taken after fasting for at least 8 hours.  Glucose, capillary     Status: None   Collection Time: 12/12/22  3:46 PM  Result Value Ref Range   Glucose-Capillary 89 70 - 99 mg/dL    Comment: Glucose reference range applies only to samples taken after fasting for at least 8 hours.    MICRO: 7/10 blood cx 2 of 2 sets + strep infantarius  Streptococcus infantarius      MIC    CLINDAMYCIN <=0.25 SENS... Sensitive    ERYTHROMYCIN <=0.12 SENS... Sensitive    TETRACYCLINE <=0.25 SENS... Sensitive    VANCOMYCIN 0.5 SENSITIVE Sensitive    7/12 blood cx pending IMAGING: DG Chest 2 View  Result Date: 12/11/2022 CLINICAL DATA:  Cough. EXAM: CHEST - 2 VIEW COMPARISON:  December 09, 2022. FINDINGS: The heart size and mediastinal contours are within normal limits. Status post transcatheter aortic valve repair. Both lungs are clear. The visualized skeletal structures are unremarkable. IMPRESSION: No active cardiopulmonary disease. Electronically Signed   By: Lupita Raider M.D.   On: 12/11/2022 16:15     Assessment/Plan:  72yo M with streptococcal bacteremia in the setting of TAVR   Recommend to continue with ceftriaxone 2gm IV daily;will follow repeat blood cx Await TTE but will need TEE for better evaluation of valves to determine length of treatment ( he follows with dr harding from cardiology)  HTN - continue on 3 ages with carvedilol, losartan and spironolactone  Afib  - continue on anticoagulants with apixaban  Moksh Loomer B. Drue Second MD MPH [facility] [phone]

## 2022-12-12 NOTE — Plan of Care (Signed)

## 2022-12-12 NOTE — Progress Notes (Signed)
   [facility] has been requested to perform a transesophageal echocardiogram on [name] for bacteremia. TTE showed a mobile pedunculated echodensity attached to the ventricular surface of the anterior mitral valve leaflet that appears to be attached to the aortic prosthesis in some views.  After careful review of history and examination, the risks and benefits of transesophageal echocardiogram have been explained including risks of esophageal damage, perforation (1:10,000 risk), bleeding, pharyngeal hematoma as well as other potential complications associated with conscious sedation including aspiration, arrhythmia, respiratory failure and death. Alternatives to treatment were discussed, questions were answered. Patient is willing to proceed.   Will make the patient NPO at midnight on 12/14/2022 in case we are able to do the procedure on Monday. However, did explain to the patient that we may not be able to do the case on Monday. He voiced understanding.  Corrin Parker, PA-C  12/12/2022 4:49 PM

## 2022-12-13 ENCOUNTER — Telehealth (HOSPITAL_BASED_OUTPATIENT_CLINIC_OR_DEPARTMENT_OTHER): Payer: Self-pay | Admitting: *Deleted

## 2022-12-13 DIAGNOSIS — R7881 Bacteremia: Secondary | ICD-10-CM | POA: Diagnosis not present

## 2022-12-13 DIAGNOSIS — I330 Acute and subacute infective endocarditis: Secondary | ICD-10-CM

## 2022-12-13 DIAGNOSIS — E039 Hypothyroidism, unspecified: Secondary | ICD-10-CM | POA: Diagnosis not present

## 2022-12-13 DIAGNOSIS — E118 Type 2 diabetes mellitus with unspecified complications: Secondary | ICD-10-CM | POA: Diagnosis not present

## 2022-12-13 LAB — GLUCOSE, CAPILLARY
Glucose-Capillary: 94 mg/dL (ref 70–99)
Glucose-Capillary: 131 mg/dL — ABNORMAL HIGH (ref 70–99)
Glucose-Capillary: 133 mg/dL — ABNORMAL HIGH (ref 70–99)
Glucose-Capillary: 107 mg/dL — ABNORMAL HIGH (ref 70–99)

## 2022-12-13 LAB — BASIC METABOLIC PANEL
Anion gap: 7 (ref 5–15)
BUN: 11 mg/dL (ref 8–23)
CO2: 22 mmol/L (ref 22–32)
Calcium: 8.5 mg/dL — ABNORMAL LOW (ref 8.9–10.3)
Chloride: 99 mmol/L (ref 98–111)
Creatinine, Ser: 0.75 mg/dL (ref 0.61–1.24)
GFR, Estimated: >60 mL/min (ref >60)
Glucose, Bld: 100 mg/dL — ABNORMAL HIGH (ref 70–99)
Potassium: 3.9 mmol/L (ref 3.5–5.1)
Sodium: 128 mmol/L — ABNORMAL LOW (ref 135–145)

## 2022-12-13 LAB — CBC
HCT: 30.2 % — ABNORMAL LOW (ref 39.0–52.0)
Hemoglobin: 9.6 g/dL — ABNORMAL LOW (ref 13.0–17.0)
MCH: 24.4 pg — ABNORMAL LOW (ref 26.0–34.0)
MCHC: 31.8 g/dL (ref 30.0–36.0)
MCV: 76.6 fL — ABNORMAL LOW (ref 80.0–100.0)
Platelets: 341 10*3/uL (ref 150–400)
RBC: 3.94 MIL/uL — ABNORMAL LOW (ref 4.22–5.81)
RDW: 15.6 % — ABNORMAL HIGH (ref 11.5–15.5)
WBC: 9.4 10*3/uL (ref 4.0–10.5)
nRBC: 0 % (ref 0.0–0.2)

## 2022-12-13 LAB — PHOSPHORUS: Phosphorus: 3.3 mg/dL (ref 2.5–4.6)

## 2022-12-13 MED ORDER — GLUCERNA SHAKE PO LIQD
237.0000 mL | Freq: Three times a day (TID) | ORAL | Status: DC
  Administered 2022-12-13 – 2022-12-25 (×29): 237 mL via ORAL

## 2022-12-13 MED ORDER — ACETAMINOPHEN 325 MG PO TABS
650.0000 mg | ORAL_TABLET | Freq: Four times a day (QID) | ORAL | Status: DC | PRN
  Administered 2022-12-13 – 2022-12-25 (×3): 650 mg via ORAL
  Filled 2022-12-13 (×3): qty 2

## 2022-12-13 NOTE — Progress Notes (Addendum)
PROGRESS NOTE    [name]  ZOX:[id number] DOB: 09-Oct-1950 DOA: 12/11/2022 PCP: [name], MD    Chief Complaint  Patient presents with   Abnormal labs    Brief Narrative:    [name] is a 72 y.o. male with medical history significant of history of TAVR, COPD, mild dementia, GERD, history of CVA, diabetes, hypertension, atrial fibrillation on anticoagulation.  Patient presents after being hospitalized overnight due to fever.  He was discharged home.  His blood cultures turned positive for Streptococcus infantarius.  He otherwise feels fine and without any more problems.  He also relates a history of falling.  He falls once twice a week where he loses his balance because he feels lightheaded.  He does not know whether this is the result of being in atrial fibrillation or whether there is something else going on.  This has been going on for the past few months.  He has been referred to cardiology and neurology for evaluation of his falls.  He currently denies fevers, chills, nausea, vomiting, abdominal pain.  He denies chest pain or shortness of breath.   Assessment & Plan:  Principal Problem:   Bacteremia Active Problems:   Diabetes mellitus type 2, controlled, with complications (HCC)   Hypertensive heart disease with chronic diastolic congestive heart failure (HCC)   GERD   COPD mixed type (HCC)   S/P TAVR (transcatheter aortic valve replacement)   Essential hypertension   Acquired hypothyroidism   Streptococcus bacteremia, possible mitral endocarditis -so far Clear etiology -Continue with IV Rocephin -Follow on repeat blood cultures, surveillance cultures remain negative at 48 hours -2D echo is significant of mobile mass and mitral valve appears to be attached to aortic prosthesis and some few, TEE is pending -Discussed with cardiology, they will arrange for TEE, likely on Monday -ID consult appreciated  Falls -Continue with PT/OT  Hypertension -  Continue blood pressure medications  History of TAVR  COPD - Continue long-acting inhalers  Essential hypertension - Continue Coreg, losartan   Mixed hyperlipidemia - Continue Crestor   Persistent atrial fibrillation - Continue Coreg, Eliquis   Acquired hypothyroidism - Continue Synthroid  Hyponatremia - improving     DVT prophylaxis: Eliquis Code Status: Full Family Communication: None at bedside Disposition:   Status is: Inpatient  Consultants:  ID   Subjective:  No significant events overnight, he denies fever, chills, chest pain or shortness of breath  Objective: Vitals:   12/12/22 1955 12/12/22 2327 12/13/22 0406 12/13/22 0800  BP: (!) 142/79 (!) 145/75 (!) 169/79 (!) 163/81  Pulse: 60 61 (!) 54 (!) 53  Resp: 12 10 10 12   Temp: 98 F (36.7 C)  98.3 F (36.8 C) 98.5 F (36.9 C)  TempSrc:    Oral  SpO2: 100% 96% 97% 99%  Weight:        Intake/Output Summary (Last 24 hours) at 12/13/2022 1115 Last data filed at 12/13/2022 0600 Gross per 24 hour  Intake 240 ml  Output 2775 ml  Net -2535 ml   Filed Weights   12/11/22 2100  Weight: 81.6 kg    Examination:  Awake Alert, Oriented X 3, frail Symmetrical Chest wall movement, Good air movement bilaterally, CTAB RRR,No Gallops,Rubs or new Murmurs, No Parasternal Heave +ve B.Sounds, Abd Soft, No tenderness, No rebound - guarding or rigidity. No Cyanosis, Clubbing or edema, No new Rash or bruise        Data Reviewed: I have personally reviewed following labs and imaging  studies  CBC: Recent Labs  Lab 12/09/22 2056 12/10/22 0623 12/11/22 1649 12/12/22 0535 12/13/22 0302  WBC 15.4* 10.6* 9.6 7.1 9.4  NEUTROABS 13.1*  --  6.7  --   --   HGB 10.7* 9.8* 10.4* 10.1* 9.6*  HCT 33.2* 30.5* 32.7* 32.2* 30.2*  MCV 74.8* 75.1* 76.2* 75.6* 76.6*  PLT 318 271 338 315 341    Basic Metabolic Panel: Recent Labs  Lab 12/09/22 2056 12/10/22 0623 12/11/22 1649 12/12/22 0535 12/13/22 0302  NA  125* 126* 129* 131* 128*  K 4.0 3.5 4.0 4.0 3.9  CL 94* 97* 96* 99 99  CO2 20* 23 25 23 22   GLUCOSE 109* 110* 101* 89 100*  BUN 14 13 9  5* 11  CREATININE 0.81 0.73 0.78 0.68 0.75  CALCIUM 8.7* 8.3* 8.8* 8.9 8.5*  MG  --  1.9  --   --   --   PHOS  --  4.1  --   --  3.3    GFR: Estimated Creatinine Clearance: 94.3 mL/min (by C-G formula based on SCr of 0.75 mg/dL).  Liver Function Tests: Recent Labs  Lab 12/09/22 2056 12/10/22 0623 12/11/22 1649  AST 18 13* 17  ALT 15 12 14   ALKPHOS 91 75 88  BILITOT 1.0 1.0 0.6  PROT 8.0 7.0 8.2*  ALBUMIN 2.8* 2.3* 2.8*    CBG: Recent Labs  Lab 12/12/22 0823 12/12/22 1149 12/12/22 1546 12/12/22 2136 12/13/22 0829  GLUCAP 74 102* 89 123* 94     Recent Results (from the past 240 hour(s))  Culture, blood (Routine x 2)     Status: Abnormal   Collection Time: 12/09/22  8:56 PM   Specimen: BLOOD RIGHT FOREARM  Result Value Ref Range Status   Specimen Description   Final    BLOOD RIGHT FOREARM Performed at [facility], [address] [place], [place] [address]    Special Requests   Final    BOTTLES DRAWN AEROBIC AND ANAEROBIC Blood Culture results may not be optimal due to an excessive volume of blood received in culture bottles Performed at [facility], [address], [place], [place] [address]    Culture  Setup Time   Final    GRAM POSITIVE COCCI IN BOTH AEROBIC AND ANAEROBIC BOTTLES Gram Stain Report Called to,Read Back By and Verified With: [facility] @ 1517 ON 12/10/2022 BY FRATTO,ASHLEY CRITICAL RESULT CALLED TO, READ BACK BY AND VERIFIED WITH: RN Marisa Hua [phone] @2242  FH Performed at [facility], [address], [place], [place] [address]    Culture STREPTOCOCCUS INFANTARIUS (A)  Final   Report Status 12/12/2022 FINAL  Final   Organism ID, Bacteria STREPTOCOCCUS INFANTARIUS  Final      Susceptibility   Streptococcus infantarius - MIC*    ERYTHROMYCIN <=0.12 SENSITIVE Sensitive     TETRACYCLINE <=0.25  SENSITIVE Sensitive     VANCOMYCIN 0.5 SENSITIVE Sensitive     CLINDAMYCIN <=0.25 SENSITIVE Sensitive     * STREPTOCOCCUS INFANTARIUS  Blood Culture ID Panel (Reflexed)     Status: Abnormal   Collection Time: 12/09/22  8:56 PM  Result Value Ref Range Status   Enterococcus faecalis NOT DETECTED NOT DETECTED Final   Enterococcus Faecium NOT DETECTED NOT DETECTED Final   Listeria monocytogenes NOT DETECTED NOT DETECTED Final   Staphylococcus species NOT DETECTED NOT DETECTED Final   Staphylococcus aureus (BCID) NOT DETECTED NOT DETECTED Final   Staphylococcus epidermidis NOT DETECTED NOT DETECTED Final   Staphylococcus lugdunensis NOT  DETECTED NOT DETECTED Final   Streptococcus species DETECTED (A) NOT DETECTED Final    Comment: Not Enterococcus species, Streptococcus agalactiae, Streptococcus pyogenes, or Streptococcus pneumoniae. CRITICAL RESULT CALLED TO, READ BACK BY AND VERIFIED WITH: RN C. Mayford Knife [phone] @2242  FH    Streptococcus agalactiae NOT DETECTED NOT DETECTED Final   Streptococcus pneumoniae NOT DETECTED NOT DETECTED Final   Streptococcus pyogenes NOT DETECTED NOT DETECTED Final   A.calcoaceticus-baumannii NOT DETECTED NOT DETECTED Final   Bacteroides fragilis NOT DETECTED NOT DETECTED Final   Enterobacterales NOT DETECTED NOT DETECTED Final   Enterobacter cloacae complex NOT DETECTED NOT DETECTED Final   Escherichia coli NOT DETECTED NOT DETECTED Final   Klebsiella aerogenes NOT DETECTED NOT DETECTED Final   Klebsiella oxytoca NOT DETECTED NOT DETECTED Final   Klebsiella pneumoniae NOT DETECTED NOT DETECTED Final   Proteus species NOT DETECTED NOT DETECTED Final   Salmonella species NOT DETECTED NOT DETECTED Final   Serratia marcescens NOT DETECTED NOT DETECTED Final   Haemophilus influenzae NOT DETECTED NOT DETECTED Final   Neisseria meningitidis NOT DETECTED NOT DETECTED Final   Pseudomonas aeruginosa NOT DETECTED NOT DETECTED Final   Stenotrophomonas maltophilia NOT  DETECTED NOT DETECTED Final   Candida albicans NOT DETECTED NOT DETECTED Final   Candida auris NOT DETECTED NOT DETECTED Final   Candida glabrata NOT DETECTED NOT DETECTED Final   Candida krusei NOT DETECTED NOT DETECTED Final   Candida parapsilosis NOT DETECTED NOT DETECTED Final   Candida tropicalis NOT DETECTED NOT DETECTED Final   Cryptococcus neoformans/gattii NOT DETECTED NOT DETECTED Final    Comment: Performed at [facility], [address] [place], [place] [address]  Culture, blood (Routine x 2)     Status: Abnormal   Collection Time: 12/09/22  9:25 PM   Specimen: BLOOD LEFT FOREARM  Result Value Ref Range Status   Specimen Description   Final    BLOOD LEFT FOREARM Performed at [facility], [address], [place], [place] [address]    Special Requests   Final    BOTTLES DRAWN AEROBIC AND ANAEROBIC Blood Culture adequate volume Performed at [facility], [address], [place], [place] [address]    Culture  Setup Time   Final    GRAM POSITIVE COCCI IN BOTH AEROBIC AND ANAEROBIC BOTTLES Gram Stain Report Called to,Read Back By and Verified With: TURNER, CHARLOTTE @ 1618 ON 12/10/2022 BY Yehuda Savannah Performed at [facility], [address], [place], [place] [address]    Culture (A)  Final    STREPTOCOCCUS INFANTARIUS SUSCEPTIBILITIES PERFORMED ON PREVIOUS CULTURE WITHIN THE LAST 5 DAYS. Performed at [facility], [address], [place], [place] [address]    Report Status 12/12/2022 FINAL  Final  Resp panel by RT-PCR (RSV, Flu A&B, Covid) Anterior Nasal Swab     Status: None   Collection Time: 12/09/22 10:43 PM   Specimen: Anterior Nasal Swab  Result Value Ref Range Status   SARS Coronavirus 2 by RT PCR NEGATIVE NEGATIVE Final    Comment: (NOTE) SARS-CoV-2 target nucleic acids are NOT DETECTED.  The SARS-CoV-2 RNA is generally detectable in upper respiratory specimens during the acute phase of infection. The lowest concentration of SARS-CoV-2  viral copies this assay can detect is 138 copies/mL. A negative result does not preclude SARS-Cov-2 infection and should not be used as the sole basis for treatment or other patient management decisions. A negative result may occur with  improper specimen collection/handling, submission of specimen  other than nasopharyngeal swab, presence of viral mutation(s) within the areas targeted by this assay, and inadequate number of viral copies(<138 copies/mL). A negative result must be combined with clinical observations, patient history, and epidemiological information. The expected result is Negative.  Fact Sheet for Patients:  BloggerCourse.com  Fact Sheet for Healthcare Providers:  SeriousBroker.it  This test is no t yet approved or cleared by the [place] FDA and  has been authorized for detection and/or diagnosis of SARS-CoV-2 by FDA under an Emergency Use Authorization (EUA). This EUA will remain  in effect (meaning this test can be used) for the duration of the COVID-19 declaration under Section 564(b)(1) of the Act, 21 U.S.C.section 360bbb-3(b)(1), unless the authorization is terminated  or revoked sooner.       Influenza A by PCR NEGATIVE NEGATIVE Final   Influenza B by PCR NEGATIVE NEGATIVE Final    Comment: (NOTE) The Xpert Xpress SARS-CoV-2/FLU/RSV plus assay is intended as an aid in the diagnosis of influenza from Nasopharyngeal swab specimens and should not be used as a sole basis for treatment. Nasal washings and aspirates are unacceptable for Xpert Xpress SARS-CoV-2/FLU/RSV testing.  Fact Sheet for Patients: BloggerCourse.com  Fact Sheet for Healthcare Providers: SeriousBroker.it  This test is not yet approved or cleared by the [place] FDA and has been authorized for detection and/or diagnosis of SARS-CoV-2 by FDA under an Emergency Use Authorization  (EUA). This EUA will remain in effect (meaning this test can be used) for the duration of the COVID-19 declaration under Section 564(b)(1) of the Act, 21 U.S.C. section 360bbb-3(b)(1), unless the authorization is terminated or revoked.     Resp Syncytial Virus by PCR NEGATIVE NEGATIVE Final    Comment: (NOTE) Fact Sheet for Patients: BloggerCourse.com  Fact Sheet for Healthcare Providers: SeriousBroker.it  This test is not yet approved or cleared by the [place] FDA and has been authorized for detection and/or diagnosis of SARS-CoV-2 by FDA under an Emergency Use Authorization (EUA). This EUA will remain in effect (meaning this test can be used) for the duration of the COVID-19 declaration under Section 564(b)(1) of the Act, 21 U.S.C. section 360bbb-3(b)(1), unless the authorization is terminated or revoked.  Performed at [facility], [address], [place], [place] [address]   Culture, blood (routine x 2)     Status: None (Preliminary result)   Collection Time: 12/11/22  4:49 PM   Specimen: Left Antecubital; Blood  Result Value Ref Range Status   Specimen Description LEFT ANTECUBITAL  Final   Special Requests   Final    BOTTLES DRAWN AEROBIC AND ANAEROBIC Blood Culture adequate volume   Culture   Final    NO GROWTH 2 DAYS Performed at [facility], [address], [place], [place] [address]    Report Status PENDING  Incomplete  Culture, blood (routine x 2)     Status: None (Preliminary result)   Collection Time: 12/11/22  4:49 PM   Specimen: Right Antecubital; Blood  Result Value Ref Range Status   Specimen Description RIGHT ANTECUBITAL  Final   Special Requests   Final    BOTTLES DRAWN AEROBIC AND ANAEROBIC Blood Culture adequate volume   Culture   Final    NO GROWTH 2 DAYS Performed at [facility], [address], [place], [place] [address]    Report Status PENDING  Incomplete         Radiology  Studies: ECHOCARDIOGRAM LIMITED  Result Date: 12/12/2022    ECHOCARDIOGRAM LIMITED REPORT  Patient Name:   [name] Date of Exam: 12/12/2022 Medical Rec #:  [id number]       Height:       73.0 in Accession #:    [id number]      Weight:       179.9 lb Date of Birth:  02/12/1951       BSA:          2.057 m Patient Age:    72 years        BP:           134/71 mmHg Patient Gender: M               HR:           57 bpm. Exam Location:  Inpatient Procedure: 2D Echo, Color Doppler and Cardiac Doppler Indications:    Bacteremia  History:        Patient has prior history of Echocardiogram examinations, most                 recent 11/19/2022. COPD, Arrythmias:Atrial Fibrillation,                 Signs/Symptoms:Fever; Risk Factors:Hypertension, Dyslipidemia                 and Diabetes.                 Aortic Valve: 29 mm Edwards Sapien prosthetic, stented (TAVR)                 valve is present in the aortic position. Procedure Date: 08/08/19.  Sonographer:    Delcie Roch RDCS Referring Phys: [id number] JACOB J STINSON IMPRESSIONS  1. Limited Echo to evaluate for infective endocarditis.  2. Left ventricular ejection fraction, by estimation, is 60 to 65%. The left ventricle has normal function. The left ventricle has no regional wall motion abnormalities. There is mild left ventricular hypertrophy. Left ventricular diastolic parameters are consistent with Grade I diastolic dysfunction (impaired relaxation).  3. Right ventricular systolic function is normal. The right ventricular size is normal. There is normal pulmonary artery systolic pressure.  4. The mitral valve is abnormal. Mild mitral valve regurgitation. No evidence of mitral stenosis. Severe mitral annular calcification. There is a mobile pedunculated echodensity measuring 0.2 x 0.3 cm attached to the ventricular surface of anterior mitral valve leaflet which is noted to be protruding into the LVOT. In some views, it appears to be attached to the aortic  prosthesis, although, with no evidence of aortic valvular dysfunction. Strongly consider TEE for further evaluation.  5. The aortic valve was not well visualized. Aortic valve regurgitation is not visualized. No aortic stenosis is present. There is a 29 mm Edwards Sapien prosthetic (TAVR) valve present in the aortic position. Procedure Date: 08/08/19. Aortic valve mean gradient measures 16.0 mmHg.  6. The inferior vena cava is normal in size with greater than 50% respiratory variability, suggesting right atrial pressure of 3 mmHg. Comparison(s): Changes from prior study are noted. Mobile echodensity noted, see above. FINDINGS  Left Ventricle: Left ventricular ejection fraction, by estimation, is 60 to 65%. The left ventricle has normal function. The left ventricle has no regional wall motion abnormalities. The left ventricular internal cavity size was normal in size. There is  mild left ventricular hypertrophy. Left ventricular diastolic parameters are consistent with Grade I diastolic dysfunction (impaired relaxation). Right Ventricle: The right ventricular size is normal. No increase in right ventricular wall thickness. Right ventricular systolic  function is normal. There is normal pulmonary artery systolic pressure. The tricuspid regurgitant velocity is 2.35 m/s, and  with an assumed right atrial pressure of 3 mmHg, the estimated right ventricular systolic pressure is 25.1 mmHg. Left Atrium: Left atrial size was not assessed. Right Atrium: Right atrial size was not assessed. Pericardium: There is no evidence of pericardial effusion. Mitral Valve: The mitral valve is abnormal. Severe mitral annular calcification. Mild mitral valve regurgitation. No evidence of mitral valve stenosis. Tricuspid Valve: The tricuspid valve is normal in structure. Tricuspid valve regurgitation is not demonstrated. No evidence of tricuspid stenosis. Aortic Valve: The aortic valve was not well visualized. Aortic valve regurgitation is not  visualized. No aortic stenosis is present. Aortic valve mean gradient measures 16.0 mmHg. Aortic valve peak gradient measures 30.5 mmHg. Aortic valve area, by VTI measures 1.43 cm. There is a 29 mm Edwards Sapien prosthetic, stented (TAVR) valve present in the aortic position. Procedure Date: 08/08/19. Pulmonic Valve: The pulmonic valve was not well visualized. Pulmonic valve regurgitation is not visualized. No evidence of pulmonic stenosis. Aorta: Aortic root could not be assessed. Venous: The inferior vena cava is normal in size with greater than 50% respiratory variability, suggesting right atrial pressure of 3 mmHg. IAS/Shunts: No atrial level shunt detected by color flow Doppler. Additional Comments: Spectral Doppler performed. Color Doppler performed.  LEFT VENTRICLE PLAX 2D LVIDd:         5.10 cm LVIDs:         3.20 cm LV PW:         1.30 cm LV IVS:        1.20 cm LVOT diam:     2.10 cm LV SV:         86 LV SV Index:   42 LVOT Area:     3.46 cm  IVC IVC diam: 1.60 cm LEFT ATRIUM         Index LA diam:    3.60 cm 1.75 cm/m  AORTIC VALVE AV Area (Vmax):    1.33 cm AV Area (Vmean):   1.29 cm AV Area (VTI):     1.43 cm AV Vmax:           276.00 cm/s AV Vmean:          183.000 cm/s AV VTI:            0.603 m AV Peak Grad:      30.5 mmHg AV Mean Grad:      16.0 mmHg LVOT Vmax:         106.00 cm/s LVOT Vmean:        68.350 cm/s LVOT VTI:          0.250 m LVOT/AV VTI ratio: 0.41 MITRAL VALVE                TRICUSPID VALVE MV Area (PHT): 2.54 cm     TR Peak grad:   22.1 mmHg MV Decel Time: 299 msec     TR Vmax:        235.00 cm/s MV E velocity: 114.00 cm/s MV A velocity: 174.00 cm/s  SHUNTS MV E/A ratio:  0.66         Systemic VTI:  0.25 m                             Systemic Diam: 2.10 cm Vishnu Priya Mallipeddi Electronically signed by Winfield Rast Mallipeddi Signature Date/Time: 12/12/2022/4:20:23 PM  Final    DG Chest 2 View  Result Date: 12/11/2022 CLINICAL DATA:  Cough. EXAM: CHEST - 2 VIEW COMPARISON:   December 09, 2022. FINDINGS: The heart size and mediastinal contours are within normal limits. Status post transcatheter aortic valve repair. Both lungs are clear. The visualized skeletal structures are unremarkable. IMPRESSION: No active cardiopulmonary disease. Electronically Signed   By: Lupita Raider M.D.   On: 12/11/2022 16:15        Scheduled Meds:  apixaban  5 mg Oral BID   carvedilol  25 mg Oral BID WC   feeding supplement (GLUCERNA SHAKE)  237 mL Oral TID BM   fluticasone furoate-vilanterol  1 puff Inhalation Daily   insulin aspart  0-5 Units Subcutaneous QHS   insulin aspart  0-9 Units Subcutaneous TID WC   levothyroxine  25 mcg Oral Daily   losartan  100 mg Oral Daily   rosuvastatin  20 mg Oral Daily   sertraline  150 mg Oral Daily   spironolactone  12.5 mg Oral Daily   Continuous Infusions:  cefTRIAXone (ROCEPHIN)  IV 2 g (12/12/22 1658)     LOS: 2 days     Huey Bienenstock, MD [facility]   To contact the attending provider between 7A-7P or the covering provider during after hours 7P-7A, please log into the web site [email or web address] and access using universal [place] password for that web site. If you do not have the password, please call the hospital operator.  12/13/2022, 11:15 AM

## 2022-12-13 NOTE — Plan of Care (Signed)
  Problem: Education: Goal: Knowledge of General Education information will improve Description Including pain rating scale, medication(s)/side effects and non-pharmacologic comfort measures Outcome: Progressing   Problem: Health Behavior/Discharge Planning: Goal: Ability to manage health-related needs will improve Outcome: Progressing   

## 2022-12-13 NOTE — Plan of Care (Signed)
  Problem: Education: Goal: Knowledge of General Education information will improve Description: Including pain rating scale, medication(s)/side effects and non-pharmacologic comfort measures Outcome: Progressing   Problem: Pain Managment: Goal: General experience of comfort will improve Outcome: Progressing   

## 2022-12-13 NOTE — Progress Notes (Signed)
[facility]    Date of Admission:  12/11/2022   Total days of antibiotics 5   ID: JIHAAD HAGG is a 72 y.o. male with  streptococcal endocarditis Principal Problem:   Bacteremia Active Problems:   Diabetes mellitus type 2, controlled, with complications (HCC)   Hypertensive heart disease with chronic diastolic congestive heart failure (HCC)   GERD   COPD mixed type (HCC)   S/P TAVR (transcatheter aortic valve replacement)   Essential hypertension   Acquired hypothyroidism    Subjective: Underwent TTE yesterday that + possible veg to MV . Patient reports being asymptomatic  TTE:  The mitral valve is abnormal. Mild mitral valve regurgitation. No  evidence of mitral stenosis. Severe mitral annular calcification. There is  a mobile pedunculated echodensity measuring 0.2 x 0.3 cm attached to the  ventricular surface of anterior  mitral valve leaflet which is noted to be protruding into the LVOT. In  some views, it appears to be attached to the aortic prosthesis, although,  with no evidence of aortic valvular dysfunction. Strongly consider TEE for  further evaluation.   Medications:   apixaban  5 mg Oral BID   carvedilol  25 mg Oral BID WC   feeding supplement (GLUCERNA SHAKE)  237 mL Oral TID BM   fluticasone furoate-vilanterol  1 puff Inhalation Daily   insulin aspart  0-5 Units Subcutaneous QHS   insulin aspart  0-9 Units Subcutaneous TID WC   levothyroxine  25 mcg Oral Daily   losartan  100 mg Oral Daily   rosuvastatin  20 mg Oral Daily   sertraline  150 mg Oral Daily   spironolactone  12.5 mg Oral Daily    Objective: Vital signs in last 24 hours: Temp:  [98 F (36.7 C)-98.5 F (36.9 C)] 98.5 F (36.9 C) (07/14 0800) Pulse Rate:  [53-61] 53 (07/14 0800) Resp:  [10-12] 12 (07/14 0800) BP: (142-169)/(75-81) 163/81 (07/14 1154) SpO2:  [96 %-100 %] 99 % (07/14 0800)  Physical Exam  Constitutional: He is oriented to person, place,  and time. He appears well-developed and well-nourished. No distress.  HENT:  Mouth/Throat: Oropharynx is clear and moist. No oropharyngeal exudate.  Cardiovascular: Normal rate, regular rhythm and normal heart sounds. Exam reveals no gallop and no friction rub.  No murmur heard.  Pulmonary/Chest: Effort normal and breath sounds normal. No respiratory distress. He has no wheezes.  Abdominal: Soft. Bowel sounds are normal. He exhibits no distension. There is no tenderness.  Lymphadenopathy:  He has no cervical adenopathy.  Neurological: He is alert and oriented to person, place, and time.  Skin: Skin is warm and dry. No rash noted. No erythema.  Psychiatric: He has a normal mood and affect. His behavior is normal.    Lab Results Recent Labs    12/12/22 0535 12/13/22 0302  WBC 7.1 9.4  HGB 10.1* 9.6*  HCT 32.2* 30.2*  NA 131* 128*  K 4.0 3.9  CL 99 99  CO2 23 22  BUN 5* 11  CREATININE 0.68 0.75   Liver Panel Recent Labs    12/11/22 1649  PROT 8.2*  ALBUMIN 2.8*  AST 17  ALT 14  ALKPHOS 88  BILITOT 0.6    Microbiology: 7/10 blood cx +  STREPTOCOCCUS INFANTARIUS Abnormal    Report Status 12/12/2022 FINAL  Organism ID, Bacteria STREPTOCOCCUS INFANTARIUS  Resulting Agency [facility]     Susceptibility   Streptococcus infantarius    MIC  CLINDAMYCIN <=0.25 SENS... Sensitive    ERYTHROMYCIN <=0.12 SENS... Sensitive    TETRACYCLINE <=0.25 SENS... Sensitive    VANCOMYCIN 0.5 SENSITIVE Sensitive        7/12 blood cx NGTD Studies/Results: ECHOCARDIOGRAM LIMITED  Result Date: 12/12/2022    ECHOCARDIOGRAM LIMITED REPORT   Patient Name:   [name] Date of Exam: 12/12/2022 Medical Rec #:  [id number]       Height:       73.0 in Accession #:    [id number]      Weight:       179.9 lb Date of Birth:  05-09-1951       BSA:          2.057 m Patient Age:    72 years        BP:           134/71 mmHg Patient Gender: M               HR:           57 bpm. Exam Location:   Inpatient Procedure: 2D Echo, Color Doppler and Cardiac Doppler Indications:    Bacteremia  History:        Patient has prior history of Echocardiogram examinations, most                 recent 11/19/2022. COPD, Arrythmias:Atrial Fibrillation,                 Signs/Symptoms:Fever; Risk Factors:Hypertension, Dyslipidemia                 and Diabetes.                 Aortic Valve: 29 mm Edwards Sapien prosthetic, stented (TAVR)                 valve is present in the aortic position. Procedure Date: 08/08/19.  Sonographer:    Delcie Roch RDCS Referring Phys: [phone] JACOB J STINSON IMPRESSIONS  1. Limited Echo to evaluate for infective endocarditis.  2. Left ventricular ejection fraction, by estimation, is 60 to 65%. The left ventricle has normal function. The left ventricle has no regional wall motion abnormalities. There is mild left ventricular hypertrophy. Left ventricular diastolic parameters are consistent with Grade I diastolic dysfunction (impaired relaxation).  3. Right ventricular systolic function is normal. The right ventricular size is normal. There is normal pulmonary artery systolic pressure.  4. The mitral valve is abnormal. Mild mitral valve regurgitation. No evidence of mitral stenosis. Severe mitral annular calcification. There is a mobile pedunculated echodensity measuring 0.2 x 0.3 cm attached to the ventricular surface of anterior mitral valve leaflet which is noted to be protruding into the LVOT. In some views, it appears to be attached to the aortic prosthesis, although, with no evidence of aortic valvular dysfunction. Strongly consider TEE for further evaluation.  5. The aortic valve was not well visualized. Aortic valve regurgitation is not visualized. No aortic stenosis is present. There is a 29 mm Edwards Sapien prosthetic (TAVR) valve present in the aortic position. Procedure Date: 08/08/19. Aortic valve mean gradient measures 16.0 mmHg.  6. The inferior vena cava is normal in size with  greater than 50% respiratory variability, suggesting right atrial pressure of 3 mmHg. Comparison(s): Changes from prior study are noted. Mobile echodensity noted, see above. FINDINGS  Left Ventricle: Left ventricular ejection fraction, by estimation, is 60 to 65%. The left ventricle has normal function. The left ventricle has no  regional wall motion abnormalities. The left ventricular internal cavity size was normal in size. There is  mild left ventricular hypertrophy. Left ventricular diastolic parameters are consistent with Grade I diastolic dysfunction (impaired relaxation). Right Ventricle: The right ventricular size is normal. No increase in right ventricular wall thickness. Right ventricular systolic function is normal. There is normal pulmonary artery systolic pressure. The tricuspid regurgitant velocity is 2.35 m/s, and  with an assumed right atrial pressure of 3 mmHg, the estimated right ventricular systolic pressure is 25.1 mmHg. Left Atrium: Left atrial size was not assessed. Right Atrium: Right atrial size was not assessed. Pericardium: There is no evidence of pericardial effusion. Mitral Valve: The mitral valve is abnormal. Severe mitral annular calcification. Mild mitral valve regurgitation. No evidence of mitral valve stenosis. Tricuspid Valve: The tricuspid valve is normal in structure. Tricuspid valve regurgitation is not demonstrated. No evidence of tricuspid stenosis. Aortic Valve: The aortic valve was not well visualized. Aortic valve regurgitation is not visualized. No aortic stenosis is present. Aortic valve mean gradient measures 16.0 mmHg. Aortic valve peak gradient measures 30.5 mmHg. Aortic valve area, by VTI measures 1.43 cm. There is a 29 mm Edwards Sapien prosthetic, stented (TAVR) valve present in the aortic position. Procedure Date: 08/08/19. Pulmonic Valve: The pulmonic valve was not well visualized. Pulmonic valve regurgitation is not visualized. No evidence of pulmonic stenosis.  Aorta: Aortic root could not be assessed. Venous: The inferior vena cava is normal in size with greater than 50% respiratory variability, suggesting right atrial pressure of 3 mmHg. IAS/Shunts: No atrial level shunt detected by color flow Doppler. Additional Comments: Spectral Doppler performed. Color Doppler performed.  LEFT VENTRICLE PLAX 2D LVIDd:         5.10 cm LVIDs:         3.20 cm LV PW:         1.30 cm LV IVS:        1.20 cm LVOT diam:     2.10 cm LV SV:         86 LV SV Index:   42 LVOT Area:     3.46 cm  IVC IVC diam: 1.60 cm LEFT ATRIUM         Index LA diam:    3.60 cm 1.75 cm/m  AORTIC VALVE AV Area (Vmax):    1.33 cm AV Area (Vmean):   1.29 cm AV Area (VTI):     1.43 cm AV Vmax:           276.00 cm/s AV Vmean:          183.000 cm/s AV VTI:            0.603 m AV Peak Grad:      30.5 mmHg AV Mean Grad:      16.0 mmHg LVOT Vmax:         106.00 cm/s LVOT Vmean:        68.350 cm/s LVOT VTI:          0.250 m LVOT/AV VTI ratio: 0.41 MITRAL VALVE                TRICUSPID VALVE MV Area (PHT): 2.54 cm     TR Peak grad:   22.1 mmHg MV Decel Time: 299 msec     TR Vmax:        235.00 cm/s MV E velocity: 114.00 cm/s MV A velocity: 174.00 cm/s  SHUNTS MV E/A ratio:  0.66  Systemic VTI:  0.25 m                             Systemic Diam: 2.10 cm Vishnu Priya Mallipeddi Electronically signed by Winfield Rast Mallipeddi Signature Date/Time: 12/12/2022/4:20:23 PM    Final    DG Chest 2 View  Result Date: 12/11/2022 CLINICAL DATA:  Cough. EXAM: CHEST - 2 VIEW COMPARISON:  December 09, 2022. FINDINGS: The heart size and mediastinal contours are within normal limits. Status post transcatheter aortic valve repair. Both lungs are clear. The visualized skeletal structures are unremarkable. IMPRESSION: No active cardiopulmonary disease. Electronically Signed   By: Lupita Raider M.D.   On: 12/11/2022 16:15     Assessment/Plan: Streptococcal MV endocarditis, but also possible on TAVR per TTE report  - recommend  TEE continue on ceftriaxone 2gm IV daily - will continue to monitor repeat blood cx  - for the time being, no other suspected nidus needs evaluation   [facility] Pager: [phone]  12/13/2022, 1:28 PM

## 2022-12-13 NOTE — Telephone Encounter (Signed)
Post ED Visit - Positive Culture Follow-up  Culture report reviewed by antimicrobial stewardship pharmacist: [facility] []  Enzo Bi, Pharm.D. []  Celedonio Miyamoto, Pharm.D., BCPS AQ-ID []  Garvin Fila, Pharm.D., BCPS []  Georgina Pillion, Pharm.D., BCPS []  [place], [place].D., BCPS, AAHIVP []  Estella Husk, Pharm.D., BCPS, AAHIVP []  Lysle Pearl, PharmD, BCPS []  Phillips Climes, PharmD, BCPS []  Agapito Games, PharmD, BCPS []  Verlan Friends, PharmD []  Mervyn Gay, PharmD, BCPS [x]  Wilburn Cornelia, PharmD  [facility] []  Len Childs, PharmD []  Greer Pickerel, PharmD []  Adalberto Cole, PharmD []  Perlie Gold, Rph []  Lonell Face) Jean Rosenthal, PharmD []  Earl Many, PharmD []  Junita Push, PharmD []  Dorna Leitz, PharmD []  Terrilee Files, PharmD []  Lynann Beaver, PharmD []  Keturah Barre, PharmD []  Loralee Pacas, PharmD []  Bernadene Person, PharmD   Positive blood culture Pt currently admitted to [facility] and being treated with IV ceftriaxone. Inpatient pharmacist was notified to follow up per Pharmacist above [name] 12/13/2022, 12:48 PM

## 2022-12-14 ENCOUNTER — Inpatient Hospital Stay (HOSPITAL_COMMUNITY): Payer: Medicare HMO | Admitting: Anesthesiology

## 2022-12-14 ENCOUNTER — Inpatient Hospital Stay (HOSPITAL_COMMUNITY): Payer: Medicare HMO

## 2022-12-14 ENCOUNTER — Encounter (HOSPITAL_COMMUNITY): Payer: Self-pay | Admitting: Family Medicine

## 2022-12-14 ENCOUNTER — Encounter (HOSPITAL_COMMUNITY)
Admission: EM | Disposition: A | Discharge status: Home / Self Care | Payer: Self-pay | Source: Home / Self Care | Attending: Internal Medicine

## 2022-12-14 DIAGNOSIS — Z952 Presence of prosthetic heart valve: Secondary | ICD-10-CM

## 2022-12-14 DIAGNOSIS — I5032 Chronic diastolic (congestive) heart failure: Secondary | ICD-10-CM

## 2022-12-14 DIAGNOSIS — I10 Essential (primary) hypertension: Secondary | ICD-10-CM | POA: Diagnosis not present

## 2022-12-14 DIAGNOSIS — Z79899 Other long term (current) drug therapy: Secondary | ICD-10-CM

## 2022-12-14 DIAGNOSIS — I350 Nonrheumatic aortic (valve) stenosis: Secondary | ICD-10-CM | POA: Diagnosis not present

## 2022-12-14 DIAGNOSIS — Z87891 Personal history of nicotine dependence: Secondary | ICD-10-CM

## 2022-12-14 DIAGNOSIS — R7881 Bacteremia: Secondary | ICD-10-CM | POA: Diagnosis not present

## 2022-12-14 DIAGNOSIS — I330 Acute and subacute infective endocarditis: Secondary | ICD-10-CM

## 2022-12-14 DIAGNOSIS — I340 Nonrheumatic mitral (valve) insufficiency: Secondary | ICD-10-CM

## 2022-12-14 DIAGNOSIS — I110 Hypertensive heart disease with heart failure: Secondary | ICD-10-CM | POA: Diagnosis not present

## 2022-12-14 HISTORY — PX: TEE WITHOUT CARDIOVERSION: SHX5443

## 2022-12-14 LAB — CULTURE, BLOOD (ROUTINE X 2)
Culture: NO GROWTH
Special Requests: ADEQUATE

## 2022-12-14 LAB — GLUCOSE, CAPILLARY
Glucose-Capillary: 82 mg/dL (ref 70–99)
Glucose-Capillary: 85 mg/dL (ref 70–99)
Glucose-Capillary: 159 mg/dL — ABNORMAL HIGH (ref 70–99)
Glucose-Capillary: 105 mg/dL — ABNORMAL HIGH (ref 70–99)

## 2022-12-14 LAB — CBC
HCT: 30.9 % — ABNORMAL LOW (ref 39.0–52.0)
Hemoglobin: 9.9 g/dL — ABNORMAL LOW (ref 13.0–17.0)
MCH: 23.7 pg — ABNORMAL LOW (ref 26.0–34.0)
MCHC: 32 g/dL (ref 30.0–36.0)
MCV: 73.9 fL — ABNORMAL LOW (ref 80.0–100.0)
Platelets: 372 10*3/uL (ref 150–400)
RBC: 4.18 MIL/uL — ABNORMAL LOW (ref 4.22–5.81)
RDW: 15.7 % — ABNORMAL HIGH (ref 11.5–15.5)
WBC: 11 10*3/uL — ABNORMAL HIGH (ref 4.0–10.5)
nRBC: 0 % (ref 0.0–0.2)

## 2022-12-14 LAB — BASIC METABOLIC PANEL
Anion gap: 12 (ref 5–15)
BUN: 13 mg/dL (ref 8–23)
CO2: 23 mmol/L (ref 22–32)
Calcium: 9.1 mg/dL (ref 8.9–10.3)
Chloride: 98 mmol/L (ref 98–111)
Creatinine, Ser: 0.85 mg/dL (ref 0.61–1.24)
GFR, Estimated: >60 mL/min (ref >60)
Glucose, Bld: 86 mg/dL (ref 70–99)
Potassium: 4.3 mmol/L (ref 3.5–5.1)
Sodium: 133 mmol/L — ABNORMAL LOW (ref 135–145)

## 2022-12-14 LAB — HEMOGLOBIN A1C
Hgb A1c MFr Bld: 5.7 % — ABNORMAL HIGH (ref 4.8–5.6)
Mean Plasma Glucose: 117 mg/dL

## 2022-12-14 LAB — ECHO TEE
AR max vel: 1.91 cm2
AV Area VTI: 1.42 cm2
AV Area mean vel: 1.77 cm2
AV Mean grad: 15.5 mmHg
AV Peak grad: 25.1 mmHg
Ao pk vel: 2.51 m/s

## 2022-12-14 SURGERY — ECHOCARDIOGRAM, TRANSESOPHAGEAL
Anesthesia: Monitor Anesthesia Care

## 2022-12-14 MED ORDER — EPHEDRINE SULFATE (PRESSORS) 50 MG/ML IJ SOLN
INTRAMUSCULAR | Status: DC | PRN
  Administered 2022-12-14 (×5): 5 mg via INTRAVENOUS

## 2022-12-14 MED ORDER — PHENYLEPHRINE 80 MCG/ML (10ML) SYRINGE FOR IV PUSH (FOR BLOOD PRESSURE SUPPORT)
PREFILLED_SYRINGE | INTRAVENOUS | Status: DC | PRN
  Administered 2022-12-14 (×6): 80 ug via INTRAVENOUS

## 2022-12-14 MED ORDER — GENTAMICIN SULFATE 40 MG/ML IJ SOLN
3.0000 mg/kg | INTRAVENOUS | Status: DC
  Administered 2022-12-14 – 2022-12-17 (×4): 240 mg via INTRAVENOUS
  Filled 2022-12-14 (×4): qty 6

## 2022-12-14 MED ORDER — LIDOCAINE 2% (20 MG/ML) 5 ML SYRINGE
INTRAMUSCULAR | Status: DC | PRN
  Administered 2022-12-14: 100 mg via INTRAVENOUS

## 2022-12-14 MED ORDER — SODIUM CHLORIDE 0.9 % IV SOLN: INTRAVENOUS | Status: DC | PRN

## 2022-12-14 MED ORDER — PROPOFOL 500 MG/50ML IV EMUL
INTRAVENOUS | Status: DC | PRN
  Administered 2022-12-14: 150 ug/kg/min via INTRAVENOUS

## 2022-12-14 NOTE — Progress Notes (Signed)
[facility]  Date of Admission:  12/11/2022      Total days of antibiotics 3   Ceftriaxone  Gentamicin 7/15 >>           ASSESSMENT: EDIBERTO SENS is a 72 y.o. male admitted after call back for positive blood cultures during evaluation of fall at the home earlier this month growing streptococcus infantarius. TEE has been done and reveals small mobile vegetation on TAVR.   Streptococcus Infantarius prosthetic valve endocarditis -  -TEE revealed small mobile vegetation on aortic TAVR. Gentamicin added today after susceptibilities returned. Discussed tx wth 2 weeks combination therapy CTX + Gentamicin then finish out the remainder of 6 weeks with CTX alone.  -OK to place PICC line anytime - planning for tomorrow per IVT availability    Medication Monitoring -  -follow creatinine closely with higher risk nephrotoxic medication.  -prefer to keep him here a bit while we monitor levels.   Discharge Coordianation -  -They would like to speak with LCSW/CM team about resources for home health assistance and anything that could be helpful for meal preparation. I encouraged them to also reach out to [place] team and get plugged in with social work services there as well.     PLAN: Continue gentamicin through 7/29 for 2 week induction - would like to keep him here until at least next week to ensure he tolerates higher risk medication well Plan for IV Ceftriaxone through 8/23 OK to place PICC line whenever able. BCx have preliminarily cleared as of 7/12.  Appreciate any assistance CM/LCSW team can offer as most of their concerns surrounded resources in the home.    Principal Problem:   Bacteremia Active Problems:   Diabetes mellitus type 2, controlled, with complications (HCC)   Hypertensive heart disease with chronic diastolic congestive heart failure (HCC)   GERD   COPD mixed type (HCC)   S/P TAVR (transcatheter aortic valve replacement)   Essential  hypertension   Acquired hypothyroidism    apixaban  5 mg Oral BID   carvedilol  25 mg Oral BID WC   feeding supplement (GLUCERNA SHAKE)  237 mL Oral TID BM   fluticasone furoate-vilanterol  1 puff Inhalation Daily   insulin aspart  0-5 Units Subcutaneous QHS   insulin aspart  0-9 Units Subcutaneous TID WC   levothyroxine  25 mcg Oral Daily   losartan  100 mg Oral Daily   rosuvastatin  20 mg Oral Daily   sertraline  150 mg Oral Daily   spironolactone  12.5 mg Oral Daily    SUBJECTIVE: Just back from TEE ready for lunch.  Aware of results of prosthetic valve vegetation.    Review of Systems: Review of Systems  Constitutional:  Negative for chills and fever.  Gastrointestinal:  Negative for abdominal pain, nausea and vomiting.  Genitourinary: Negative.   Skin:  Negative for rash.     Allergies  Allergen Reactions   Bee Venom Anaphylaxis   Bystolic [Nebivolol Hcl] Other (See Comments)    Nightmares, flashbacks (PTSD)   Codeine Shortness Of Breath and Rash   Doxycycline Shortness Of Breath and Rash   Iodinated Contrast Media Shortness Of Breath and Anaphylaxis    Lost consciousness/difficulty breathing Omni - Paque Contrast    Iohexol Hives, Shortness Of Breath and Rash    Neck and Torso erythemia IVP-DYE >> SHORTNESS OF BREATH    Levaquin [Levofloxacin] Hives and Shortness Of Breath  Oxycodone Hcl Shortness Of Breath, Swelling and Rash   Stadol [Butorphanol] Shortness Of Breath   Succinylcholine Chloride Shortness Of Breath, Nausea Only, Rash and Other (See Comments)    Difficulty breathing    Tamiflu [Oseltamivir Phosphate]     UNSPECIFIED REACTION    Atorvastatin     myalgias   Synvisc [Hylan G-F 20] Hives and Rash   Gemfibrozil     Other reaction(s): Serum cholesterol raised   Latex Other (See Comments)    blister   Lisinopril     Other reaction(s): Cough   Omeprazole    Dilaudid [Hydromorphone Hcl] Nausea Only and Rash    Aggressive    Methocarbamol  Nausea Only and Rash   Niacin Rash   Pentazocine Lactate Rash    rash    OBJECTIVE: Vitals:   12/14/22 0803 12/14/22 0838 12/14/22 1108 12/14/22 1320  BP: (!) 161/88  (!) 146/96 138/89  Pulse: 60  76 61  Resp: 15  16 10   Temp: 97.8 F (36.6 C)  97.9 F (36.6 C) 97.6 F (36.4 C)  TempSrc: Oral  Temporal Oral  SpO2: 98% 98% 100% 92%  Weight:       Body mass index is 23.73 kg/m.  Physical Exam Vitals reviewed.  Constitutional:      Appearance: Normal appearance.  HENT:     Mouth/Throat:     Mouth: Mucous membranes are moist.     Pharynx: Oropharynx is clear.  Cardiovascular:     Rate and Rhythm: Normal rate and regular rhythm.  Pulmonary:     Effort: Pulmonary effort is normal.     Breath sounds: Normal breath sounds.  Abdominal:     General: Bowel sounds are normal. There is no distension.  Musculoskeletal:        General: Normal range of motion.  Skin:    General: Skin is warm.     Capillary Refill: Capillary refill takes less than 2 seconds.     Findings: No erythema or rash.  Neurological:     Mental Status: He is alert and oriented to person, place, and time.     Lab Results Lab Results  Component Value Date   WBC 11.0 (H) 12/14/2022   HGB 9.9 (L) 12/14/2022   HCT 30.9 (L) 12/14/2022   MCV 73.9 (L) 12/14/2022   PLT 372 12/14/2022    Lab Results  Component Value Date   CREATININE 0.85 12/14/2022   BUN 13 12/14/2022   NA 133 (L) 12/14/2022   K 4.3 12/14/2022   CL 98 12/14/2022   CO2 23 12/14/2022    Lab Results  Component Value Date   ALT 14 12/11/2022   AST 17 12/11/2022   ALKPHOS 88 12/11/2022   BILITOT 0.6 12/11/2022     Microbiology: Recent Results (from the past 240 hour(s))  Culture, blood (Routine x 2)     Status: Abnormal   Collection Time: 12/09/22  8:56 PM   Specimen: BLOOD RIGHT FOREARM  Result Value Ref Range Status   Specimen Description   Final    BLOOD RIGHT FOREARM Performed at [facility], [address]  [place], [place] [address]    Special Requests   Final    BOTTLES DRAWN AEROBIC AND ANAEROBIC Blood Culture results may not be optimal due to an excessive volume of blood received in culture bottles Performed at [facility], [address] [place], [place] [address]    Culture  Setup Time   Final    Romie Minus  POSITIVE COCCI IN BOTH AEROBIC AND ANAEROBIC BOTTLES Gram Stain Report Called to,Read Back By and Verified With: [facility] @ 1517 ON 12/10/2022 BY FRATTO,ASHLEY CRITICAL RESULT CALLED TO, READ BACK BY AND VERIFIED WITH: RN Marisa Hua [phone] @2242  FH Performed at [facility], [address], [place], [place] [address]    Culture STREPTOCOCCUS INFANTARIUS (A)  Final   Report Status 12/14/2022 FINAL  Final   Organism ID, Bacteria STREPTOCOCCUS INFANTARIUS  Final      Susceptibility   Streptococcus infantarius - MIC*    ERYTHROMYCIN <=0.12 SENSITIVE Sensitive     TETRACYCLINE <=0.25 SENSITIVE Sensitive     VANCOMYCIN 0.5 SENSITIVE Sensitive     CLINDAMYCIN <=0.25 SENSITIVE Sensitive     PENICILLIN 0.25 INTERMEDIATE Intermediate     CEFTRIAXONE 0.25 SENSITIVE Sensitive     LEVOFLOXACIN 2 SENSITIVE Sensitive     * STREPTOCOCCUS INFANTARIUS  Blood Culture ID Panel (Reflexed)     Status: Abnormal   Collection Time: 12/09/22  8:56 PM  Result Value Ref Range Status   Enterococcus faecalis NOT DETECTED NOT DETECTED Final   Enterococcus Faecium NOT DETECTED NOT DETECTED Final   Listeria monocytogenes NOT DETECTED NOT DETECTED Final   Staphylococcus species NOT DETECTED NOT DETECTED Final   Staphylococcus aureus (BCID) NOT DETECTED NOT DETECTED Final   Staphylococcus epidermidis NOT DETECTED NOT DETECTED Final   Staphylococcus lugdunensis NOT DETECTED NOT DETECTED Final   Streptococcus species DETECTED (A) NOT DETECTED Final    Comment: Not Enterococcus species, Streptococcus agalactiae, Streptococcus pyogenes, or Streptococcus pneumoniae. CRITICAL RESULT CALLED TO, READ BACK BY AND  VERIFIED WITH: RN C. TURNER [phone] @2242  FH    Streptococcus agalactiae NOT DETECTED NOT DETECTED Final   Streptococcus pneumoniae NOT DETECTED NOT DETECTED Final   Streptococcus pyogenes NOT DETECTED NOT DETECTED Final   A.calcoaceticus-baumannii NOT DETECTED NOT DETECTED Final   Bacteroides fragilis NOT DETECTED NOT DETECTED Final   Enterobacterales NOT DETECTED NOT DETECTED Final   Enterobacter cloacae complex NOT DETECTED NOT DETECTED Final   Escherichia coli NOT DETECTED NOT DETECTED Final   Klebsiella aerogenes NOT DETECTED NOT DETECTED Final   Klebsiella oxytoca NOT DETECTED NOT DETECTED Final   Klebsiella pneumoniae NOT DETECTED NOT DETECTED Final   Proteus species NOT DETECTED NOT DETECTED Final   Salmonella species NOT DETECTED NOT DETECTED Final   Serratia marcescens NOT DETECTED NOT DETECTED Final   Haemophilus influenzae NOT DETECTED NOT DETECTED Final   Neisseria meningitidis NOT DETECTED NOT DETECTED Final   Pseudomonas aeruginosa NOT DETECTED NOT DETECTED Final   Stenotrophomonas maltophilia NOT DETECTED NOT DETECTED Final   Candida albicans NOT DETECTED NOT DETECTED Final   Candida auris NOT DETECTED NOT DETECTED Final   Candida glabrata NOT DETECTED NOT DETECTED Final   Candida krusei NOT DETECTED NOT DETECTED Final   Candida parapsilosis NOT DETECTED NOT DETECTED Final   Candida tropicalis NOT DETECTED NOT DETECTED Final   Cryptococcus neoformans/gattii NOT DETECTED NOT DETECTED Final    Comment: Performed at [facility], [address], [place], [place] [address]  Culture, blood (Routine x 2)     Status: Abnormal   Collection Time: 12/09/22  9:25 PM   Specimen: BLOOD LEFT FOREARM  Result Value Ref Range Status   Specimen Description   Final    BLOOD LEFT FOREARM Performed at [facility], [address] [place], [place] [address]    Special Requests   Final    BOTTLES DRAWN AEROBIC AND ANAEROBIC  Blood Culture adequate volume Performed at [facility], [address], [place], [place] [address]    Culture  Setup Time   Final    GRAM POSITIVE COCCI IN BOTH AEROBIC AND ANAEROBIC BOTTLES Gram Stain Report Called to,Read Back By and Verified With: TURNER, CHARLOTTE @ 1618 ON 12/10/2022 BY Yehuda Savannah Performed at [facility], [address], [place], [place] [address]    Culture (A)  Final    STREPTOCOCCUS INFANTARIUS SUSCEPTIBILITIES PERFORMED ON PREVIOUS CULTURE WITHIN THE LAST 5 DAYS. Performed at [facility], [address] [place], [place] [address]    Report Status 12/12/2022 FINAL  Final  Resp panel by RT-PCR (RSV, Flu A&B, Covid) Anterior Nasal Swab     Status: None   Collection Time: 12/09/22 10:43 PM   Specimen: Anterior Nasal Swab  Result Value Ref Range Status   SARS Coronavirus 2 by RT PCR NEGATIVE NEGATIVE Final    Comment: (NOTE) SARS-CoV-2 target nucleic acids are NOT DETECTED.  The SARS-CoV-2 RNA is generally detectable in upper respiratory specimens during the acute phase of infection. The lowest concentration of SARS-CoV-2 viral copies this assay can detect is 138 copies/mL. A negative result does not preclude SARS-Cov-2 infection and should not be used as the sole basis for treatment or other patient management decisions. A negative result may occur with  improper specimen collection/handling, submission of specimen other than nasopharyngeal swab, presence of viral mutation(s) within the areas targeted by this assay, and inadequate number of viral copies(<138 copies/mL). A negative result must be combined with clinical observations, patient history, and epidemiological information. The expected result is Negative.  Fact Sheet for Patients:  BloggerCourse.com  Fact Sheet for Healthcare Providers:  SeriousBroker.it  This test is no t yet approved or cleared by the [place] FDA and  has been authorized for detection and/or diagnosis of  SARS-CoV-2 by FDA under an Emergency Use Authorization (EUA). This EUA will remain  in effect (meaning this test can be used) for the duration of the COVID-19 declaration under Section 564(b)(1) of the Act, 21 U.S.C.section 360bbb-3(b)(1), unless the authorization is terminated  or revoked sooner.       Influenza A by PCR NEGATIVE NEGATIVE Final   Influenza B by PCR NEGATIVE NEGATIVE Final    Comment: (NOTE) The Xpert Xpress SARS-CoV-2/FLU/RSV plus assay is intended as an aid in the diagnosis of influenza from Nasopharyngeal swab specimens and should not be used as a sole basis for treatment. Nasal washings and aspirates are unacceptable for Xpert Xpress SARS-CoV-2/FLU/RSV testing.  Fact Sheet for Patients: BloggerCourse.com  Fact Sheet for Healthcare Providers: SeriousBroker.it  This test is not yet approved or cleared by the [place] FDA and has been authorized for detection and/or diagnosis of SARS-CoV-2 by FDA under an Emergency Use Authorization (EUA). This EUA will remain in effect (meaning this test can be used) for the duration of the COVID-19 declaration under Section 564(b)(1) of the Act, 21 U.S.C. section 360bbb-3(b)(1), unless the authorization is terminated or revoked.     Resp Syncytial Virus by PCR NEGATIVE NEGATIVE Final    Comment: (NOTE) Fact Sheet for Patients: BloggerCourse.com  Fact Sheet for Healthcare Providers: SeriousBroker.it  This test is not yet approved or cleared by the [place] FDA and has been authorized for detection and/or diagnosis of SARS-CoV-2 by FDA under an Emergency Use Authorization (EUA). This EUA will remain in effect (meaning this test can be used) for the duration of the COVID-19 declaration under  Section 564(b)(1) of the Act, 21 U.S.C. section 360bbb-3(b)(1), unless the authorization is terminated  or revoked.  Performed at [facility], [address] [place], [place] [address]   Culture, blood (routine x 2)     Status: None (Preliminary result)   Collection Time: 12/11/22  4:49 PM   Specimen: Left Antecubital; Blood  Result Value Ref Range Status   Specimen Description LEFT ANTECUBITAL  Final   Special Requests   Final    BOTTLES DRAWN AEROBIC AND ANAEROBIC Blood Culture adequate volume   Culture   Final    NO GROWTH 3 DAYS Performed at [facility], [address], [place], [place] [address]    Report Status PENDING  Incomplete  Culture, blood (routine x 2)     Status: None (Preliminary result)   Collection Time: 12/11/22  4:49 PM   Specimen: Right Antecubital; Blood  Result Value Ref Range Status   Specimen Description RIGHT ANTECUBITAL  Final   Special Requests   Final    BOTTLES DRAWN AEROBIC AND ANAEROBIC Blood Culture adequate volume   Culture   Final    NO GROWTH 3 DAYS Performed at [facility], [address], [place], [place] [address]    Report Status PENDING  Incomplete     Rexene Alberts, MSN, NP-C [facility]  [email or web address] Pager: [phone] Office: [phone] [facility] Main Line: [phone] *Secure Chat Communication Welcome

## 2022-12-14 NOTE — Anesthesia Procedure Notes (Signed)
Procedure Name: MAC Date/Time: 12/14/2022 11:48 AM  Performed by: Audie Pinto, CRNAPre-anesthesia Checklist: Patient identified, Emergency Drugs available, Suction available and Patient being monitored Patient Re-evaluated:Patient Re-evaluated prior to induction Oxygen Delivery Method: Nasal cannula Induction Type: IV induction Placement Confirmation: positive ETCO2 Dental Injury: Teeth and Oropharynx as per pre-operative assessment

## 2022-12-14 NOTE — Progress Notes (Signed)
Pharmacy Antibiotic Note  [name] is a 72 y.o. male s/p TAVR admitted on 12/11/2022 with Strep infantarius bacteremia currently on ceftriaxone. TTE concerning for mitral vegetation, TEE results pending. Penicillin susceptibility to S. Infantarius is intermediate (MIC = 0.25); adding gentamicin for synergy given concern for endocarditis. Renal function is stable with Scr 0.85, CrCl 88.8 ml/min. Pharmacy has been consulted for gentamicin dosing.  Plan: Gentamicin 240mg  (3mg /kg IBW) IV Q24H. Will follow up with TEE results. Monitor renal function.  Weight: 81.6 kg (179 lb 14.3 oz)  Temp (24hrs), Avg:98.1 F (36.7 C), Min:97.8 F (36.6 C), Max:98.4 F (36.9 C)  Recent Labs  Lab 12/09/22 2056 12/09/22 2356 12/10/22 0623 12/11/22 1649 12/11/22 1840 12/12/22 0535 12/13/22 0302 12/14/22 0549  WBC 15.4*  --  10.6* 9.6  --  7.1 9.4 11.0*  CREATININE 0.81  --  0.73 0.78  --  0.68 0.75 0.85  LATICACIDVEN 1.0 1.1  --  0.9 1.0  --   --   --     Estimated Creatinine Clearance: 88.8 mL/min (by C-G formula based on SCr of 0.85 mg/dL).    Allergies  Allergen Reactions   Bee Venom Anaphylaxis   Bystolic [Nebivolol Hcl] Other (See Comments)    Nightmares, flashbacks (PTSD)   Codeine Shortness Of Breath and Rash   Doxycycline Shortness Of Breath and Rash   Iodinated Contrast Media Shortness Of Breath and Anaphylaxis    Lost consciousness/difficulty breathing Omni - Paque Contrast    Iohexol Hives, Shortness Of Breath and Rash    Neck and Torso erythemia IVP-DYE >> SHORTNESS OF BREATH    Levaquin [Levofloxacin] Hives and Shortness Of Breath   Oxycodone Hcl Shortness Of Breath, Swelling and Rash   Stadol [Butorphanol] Shortness Of Breath   Succinylcholine Chloride Shortness Of Breath, Nausea Only, Rash and Other (See Comments)    Difficulty breathing    Tamiflu [Oseltamivir Phosphate]     UNSPECIFIED REACTION    Atorvastatin     myalgias   Synvisc [Hylan G-F 20] Hives and Rash    Gemfibrozil     Other reaction(s): Serum cholesterol raised   Latex Other (See Comments)    blister   Lisinopril     Other reaction(s): Cough   Omeprazole    Dilaudid [Hydromorphone Hcl] Nausea Only and Rash    Aggressive    Methocarbamol Nausea Only and Rash   Niacin Rash   Pentazocine Lactate Rash    rash    Antimicrobials this admission: Ceftriaxone 7/12 >> Gentamicin 7/14 >>   Microbiology results: 7/10 BCx: Strep infantarius 7/12 BCx: NG x 3 days   Thank you for allowing pharmacy to be a part of this patient's care.  Lendon Ka, PharmD Candidate  12/14/2022 9:36 AM

## 2022-12-14 NOTE — Consult Note (Incomplete)
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 Edwards Sapien prosthetic, stented (TAVR) valve is present in the aortic position. Procedure Date: 08/08/19.  Sonographer:    Delcie Roch RDCS Referring Phys: [phone] JACOB J STINSON  IMPRESSIONS   1. Limited Echo to evaluate for infective endocarditis. 2. Left ventricular ejection fraction, by estimation, is 60 to 65%. The left ventricle has normal function. The left ventricle has no regional wall motion abnormalities. There is mild left ventricular hypertrophy. Left ventricular diastolic parameters are consistent with Grade I diastolic dysfunction  (impaired relaxation). 3. Right ventricular systolic function is normal. The right ventricular size is normal. There is normal pulmonary artery systolic pressure. 4. The mitral valve is abnormal. Mild mitral valve regurgitation. No evidence of mitral stenosis. Severe mitral annular calcification. There is a mobile pedunculated echodensity measuring 0.2 x 0.3 cm attached to the ventricular surface of anterior mitral valve leaflet which is noted to be protruding into the LVOT. In some views, it appears to be attached to the aortic prosthesis, although, with no evidence of aortic valvular dysfunction. Strongly consider TEE for further evaluation. 5. The aortic valve was not well visualized. Aortic valve regurgitation is not visualized. No aortic stenosis is present. There is a 29 mm Edwards Sapien prosthetic (TAVR) valve present in the aortic position. Procedure Date: 08/08/19. Aortic valve mean gradient measures 16.0 mmHg. 6. The inferior vena cava is normal in size with greater than 50% respiratory variability, suggesting right atrial pressure of 3 mmHg.  Comparison(s): Changes from prior study are noted. Mobile echodensity noted, see above.  FINDINGS Left Ventricle: Left ventricular ejection fraction, by estimation, is 60 to 65%. The left ventricle has normal function. The left ventricle has no regional wall motion abnormalities. The left ventricular internal cavity size was normal in size. There is mild left ventricular hypertrophy. Left ventricular diastolic parameters are consistent with Grade I diastolic dysfunction (impaired relaxation).  Right Ventricle: The right ventricular size is normal. No increase in right ventricular wall thickness. Right ventricular systolic function is normal. There is normal pulmonary artery systolic pressure. The tricuspid regurgitant velocity is 2.35 m/s, and with an assumed right atrial pressure of 3 mmHg, the estimated right ventricular systolic pressure is 25.1  mmHg.  Left Atrium: Left atrial size was not assessed.  Right Atrium: Right atrial size was not assessed.  Pericardium: There is no evidence of pericardial effusion.  Mitral Valve: The mitral valve is abnormal. Severe mitral annular calcification. Mild mitral valve regurgitation. No evidence of mitral valve stenosis.  Tricuspid Valve: The tricuspid valve is normal in structure. Tricuspid valve regurgitation is not demonstrated. No evidence of tricuspid stenosis.  Aortic Valve: The aortic valve was not well visualized. Aortic valve regurgitation is not visualized. No aortic stenosis is present. Aortic valve mean gradient measures 16.0 mmHg. Aortic valve peak gradient measures 30.5 mmHg. Aortic valve area, by VTI measures 1.43 cm. There is a 29 mm Edwards Sapien prosthetic, stented (TAVR) valve present in the aortic position. Procedure Date: 08/08/19.  Pulmonic Valve: The pulmonic valve was not well visualized. Pulmonic valve regurgitation is not visualized. No evidence of pulmonic stenosis.  Aorta: Aortic root could not be assessed.  Venous: The inferior vena cava is normal in size with greater than 50% respiratory variability, suggesting right atrial pressure of 3 mmHg.  IAS/Shunts: No atrial level shunt detected by color flow Doppler.  Additional Comments: Spectral Doppler performed. Color Doppler performed.  LEFT VENTRICLE PLAX 2D LVIDd:         5.10 cm LVIDs:         3.20 cm LV PW:         1.30 cm LV IVS:        1.20 cm LVOT diam:     2.10 cm LV SV:         86 LV SV Index:   42 LVOT Area:     3.46 cm   IVC IVC diam: 1.60 cm  LEFT ATRIUM         Index LA diam:    3.60 cm 1.75 cm/m AORTIC VALVE AV Area (Vmax):    1.33 cm AV Area (Vmean):   1.29 cm AV Area (VTI):     1.43 cm AV Vmax:           276.00 cm/s AV Vmean:          183.000 cm/s AV VTI:            0.603 m AV Peak Grad:      30.5 mmHg AV Mean Grad:      16.0 mmHg LVOT Vmax:         106.00 cm/s LVOT  Vmean:        68.350 cm/s LVOT VTI:          0.250 m LVOT/AV VTI ratio: 0.41  MITRAL VALVE                TRICUSPID VALVE MV Area (PHT): 2.54 cm     TR Peak grad:   22.1 mmHg MV Decel Time: 299 msec     TR Vmax:        235.00 cm/s MV E velocity: 114.00 cm/s MV A velocity: 174.00 cm/s  SHUNTS MV E/A ratio:  0.66         Systemic VTI:  0.25 m Systemic Diam: 2.10 cm  Vishnu Priya Mallipeddi Electronically signed by Winfield Rast Mallipeddi Signature Date/Time: 12/12/2022/4:20:23 PM    Final   TEE  ECHO TEE 12/14/2022  Narrative TRANSESOPHOGEAL ECHO REPORT    Patient Name:   OBRYAN RADU Date of Exam: 12/14/2022 Medical Rec #:  [id number]       Height:       73.0 in Accession #:    [id number]      Weight:       179.9 lb Date of Birth:  20-Sep-1950       BSA:          2.057 m Patient Age:    72 years  BP:           159/93 mmHg Patient Gender: M               HR:           59 bpm. Exam Location:  Inpatient  Procedure: Transesophageal Echo, Color Doppler and Cardiac Doppler  Indications:     Bacteremia  History:         Patient has prior history of Echocardiogram examinations, most recent 12/12/2022. COPD; Risk Factors:Hypertension, Diabetes and Dyslipidemia. 29mm Edwards Sapien TAVR Implanted 08/08/19.  Aortic Valve: 29 mm Sapien prosthetic, stented (TAVR) valve is present in the aortic position. Procedure Date: 2021.  Sonographer:     Harriette Bouillon RDCS Referring Phys:  [id number] Corrin Parker Diagnosing Phys: Zoila Shutter MD  PROCEDURE: After discussion of the risks and benefits of a TEE, an informed consent was obtained from the patient. The transesophogeal probe was passed without difficulty through the esophogus of the patient. Sedation performed by different physician. The patient was monitored while under deep sedation. Anesthestetic sedation was provided intravenously by Anesthesiology: 195mg  of Propofol, 100mg  of Lidocaine. Supplementary images were  obtained from transthoracic windows as indicated to answer the clinical question. The patient developed no complications during the procedure.  IMPRESSIONS   1. Left ventricular ejection fraction, by estimation, is 55 to 60%. The left ventricle has normal function. There is mild left ventricular hypertrophy. 2. Right ventricular systolic function is normal. The right ventricular size is normal. 3. No left atrial/left atrial appendage thrombus was detected. 4. The mitral valve is degenerative. Mild mitral valve regurgitation. 5. Mobile filmentous mass in the LVOT adherant to the valve, likely vegetation. The aortic valve has been repaired/replaced. There is moderate thickening of the aortic valve. Aortic valve regurgitation is not visualized. Mild to moderate aortic valve stenosis. There is a 29 mm Sapien prosthetic (TAVR) valve present in the aortic position. Procedure Date: 2021. 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 Aortic valve area, by VTI measures 1.42 cm. Aortic valve mean gradient measures 15.5 mmHg. Aortic valve Vmax measures 2.50 m/s. 6. There is mild (Grade II) layered plaque involving the ascending aorta and aortic arch.  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 Consider structural heart team evaluation. Suspect treatment will be prolonged course of antibiotic therapy and repeat imaging of the valve after.  FINDINGS Left Ventricle: Left ventricular ejection fraction, by estimation, is 55 to 60%. The left ventricle has normal function. The left ventricular internal cavity size was normal in size. There is mild left ventricular hypertrophy.  Right Ventricle: The right ventricular size is normal. No increase in right ventricular wall thickness. Right ventricular systolic function is normal.  Left Atrium: Left atrial size was normal in size. No left atrial/left atrial appendage  thrombus was detected.  Right Atrium: Right atrial size was normal in size.  Pericardium: There is no evidence of pericardial effusion.  Mitral Valve: The mitral valve is degenerative in appearance. There is moderate thickening of the mitral valve leaflet(s). There is moderate calcification of the posterior mitral valve leaflet(s). Mild to moderate mitral annular calcification. Mild mitral valve regurgitation.  Tricuspid Valve: The tricuspid valve is grossly normal. Tricuspid valve regurgitation is trivial.  Aortic Valve: Mobile filmentous mass in the LVOT adherant to the valve, likely vegetation. The aortic valve has been repaired/replaced. There is moderate thickening of the aortic valve. Aortic valve regurgitation is not visualized. Mild to moderate aortic stenosis is  present. Aortic valve mean gradient measures 15.5 mmHg. Aortic valve peak gradient measures 25.1 mmHg. Aortic valve area, by VTI measures 1.42 cm. There is a 29 mm Sapien prosthetic, stented (TAVR) valve present in the aortic position. Procedure Date: 2021.  Pulmonic Valve: The pulmonic valve was not well visualized. Pulmonic valve regurgitation is not visualized.  Aorta: The aortic root and ascending aorta are structurally normal, with no evidence of dilitation. There is mild (Grade II) layered plaque involving the ascending aotra and aortic arch.  Venous: The left lower pulmonary vein and left upper pulmonary vein are normal.  IAS/Shunts: No atrial level shunt detected by color flow Doppler.  Additional Comments: Spectral Doppler performed.  LEFT VENTRICLE PLAX 2D LVOT diam:     2.20 cm LV SV:         72 LV SV Index:   35 LVOT Area:     3.80 cm   AORTIC VALVE AV Area (Vmax):    1.91 cm AV Area (Vmean):   1.77 cm AV Area (VTI):     1.42 cm AV Vmax:           250.50 cm/s AV Vmean:          185.000 cm/s AV VTI:            0.508 m AV Peak Grad:      25.1 mmHg AV Mean Grad:      15.5 mmHg LVOT Vmax:          126.00 cm/s LVOT Vmean:        86.300 cm/s LVOT VTI:          0.190 m LVOT/AV VTI ratio: 0.37  AORTA Ao Asc diam: 4.00 cm   SHUNTS Systemic VTI:  0.19 m Systemic Diam: 2.20 cm  Zoila Shutter MD Electronically signed by Zoila Shutter MD Signature Date/Time: 12/14/2022/2:41:36 PM    Final    CT SCANS  CT CORONARY MORPH W/CTA COR W/SCORE 07/28/2019  Narrative CLINICAL DATA:  Aortic Stenosis  EXAM: Cardiac TAVR CT  TECHNIQUE: The patient was scanned on a Siemens Force 192 slice scanner. A 120 kV retrospective scan was triggered in the ascending thoracic aorta at 140 HU's. Gantry rotation speed was 250 msecs and collimation was .6 mm. No beta blockade or nitro were given. The 3D data set was reconstructed in 5% intervals of the R-R cycle. Systolic and diastolic phases were analyzed on a dedicated work station using MPR, MIP and VRT modes. The patient received 80 cc of contrast.  FINDINGS: Aortic Valve: Tri leaflet calcified with restricted leaflet motion Severe calcification of the non coronary cusp  Aorta: Mild root dilatation normal arch vessels moderate calcific atherosclerosis  Sino-tubular Junction: 27 mm  Ascending Thoracic Aorta: 38 mm  Aortic Arch: 26 mm  Descending Thoracic Aorta: 26 mm  Sinus of Valsalva Measurements:  Non-coronary: 33.8 mm  Right - coronary: 34.5 mm  Left -   coronary: 38.5 mm  Coronary Artery Height above Annulus:  Left Main: 15.4 mm above annulus  Right Coronary: 16 mm above annulus  Virtual Basal Annulus Measurements:  Maximum / Minimum Diameter: 32 mm x 24.9 mm  Perimeter: 91 mm  Area: 595 mm2  Coronary Arteries: Sufficient height above annulus for deployment  Optimum Fluoroscopic Angle for Delivery: LAO 28 Caudal 1 degree  IMPRESSION: 1. Tri leaflet AV with annular area of 595 mm2 suitable for a 29 mm Sapien 3 ultra valve  2.  Mild aortic root dilatation 3.8 cm  3.  Optimum angiographic angle for  deployment LAO 28 Caudal 1 degree  4.  Coronary arteries sufficient height above annulus for deployment  Charlton Haws   Electronically Signed By: Charlton Haws M.D. On: 07/28/2019 12:43          Laboratory Data:  High Sensitivity Troponin:  No results for input(s): "TROPONINIHS" in the last 720 hours.   Chemistry Recent Labs  Lab 12/13/22 0302 12/15/22 0549 12/15/22 0153  NA 128* 133* 131*  K 3.9 4.3 4.4  CL 99 98 100  CO2 22 23 25   GLUCOSE 100* 86 98  BUN 11 13 15   CREATININE 0.75 0.85 0.94  CALCIUM 8.5* 9.1 8.6*  GFRNONAA >60 >60 >60  ANIONGAP 7 12 6     Recent Labs  Lab 12/09/22 2056 12/10/22 0623 12/11/22 1649  PROT 8.0 7.0 8.2*  ALBUMIN 2.8* 2.3* 2.8*  AST 18 13* 17  ALT 15 12 14   ALKPHOS 91 75 88  BILITOT 1.0 1.0 0.6   Hematology Recent Labs  Lab 12/13/22 0302 12/15/22 0549 12/15/22 0153  WBC 9.4 11.0* 11.8*  RBC 3.94* 4.18* 4.01*  HGB 9.6* 9.9* 9.4*  HCT 30.2* 30.9* 30.6*  MCV 76.6* 73.9* 76.3*  MCH 24.4* 23.7* 23.4*  MCHC 31.8 32.0 30.7  RDW 15.6* 15.7* 15.9*  PLT 341 372 365   BNP Recent Labs  Lab 12/09/22 2125  BNP 290.0*    DDimer No results for input(s): "DDIMER" in the last 168 hours.   Radiology/Studies:  ECHO TEE  Result Date: 12/15/22    TRANSESOPHOGEAL ECHO REPORT   Patient Name:   ALEISTER LADY Date of Exam: Dec 15, 2022 Medical Rec #:  [id number]       Height:       73.0 in Accession #:    [id number]      Weight:       179.9 lb Date of Birth:  10/30/1950       BSA:          2.057 m Patient Age:    72 years        BP:           159/93 mmHg Patient Gender: M               HR:           59 bpm. Exam Location:  Inpatient Procedure: Transesophageal Echo, Color Doppler and Cardiac Doppler Indications:     Bacteremia  History:         Patient has prior history of Echocardiogram examinations, most                  recent 12/12/2022. COPD; Risk Factors:Hypertension, Diabetes and                  Dyslipidemia. 29mm Edwards Sapien TAVR  Implanted 08/08/19.                   Aortic Valve: 29 mm Sapien prosthetic, stented (TAVR) valve is                  present in the aortic position. Procedure Date: 2021.  Sonographer:     Harriette Bouillon RDCS Referring Phys:  [id number] Corrin Parker Diagnosing Phys: Zoila Shutter MD PROCEDURE: After discussion of the risks and benefits of a TEE, an informed consent was obtained from the patient. The transesophogeal probe was passed without difficulty through the esophogus of the patient. Sedation performed by different physician.  The patient was monitored while under deep sedation. Anesthestetic sedation was provided intravenously by Anesthesiology: 195mg  of Propofol, 100mg  of Lidocaine. Supplementary images were obtained from transthoracic windows as indicated to answer the clinical question. The patient developed no complications during the procedure.  IMPRESSIONS  1. Left ventricular ejection fraction, by estimation, is 55 to 60%. The left ventricle has normal function. There is mild left ventricular hypertrophy.  2. Right ventricular systolic function is normal. The right ventricular size is normal.  3. No left atrial/left atrial appendage thrombus was detected.  4. The mitral valve is degenerative. Mild mitral valve regurgitation.  5. Mobile filmentous mass in the LVOT adherant to the valve, likely vegetation. The aortic valve has been repaired/replaced. There is moderate thickening of the aortic valve. Aortic valve regurgitation is not visualized. Mild to moderate aortic valve stenosis. There is a 29 mm Sapien prosthetic (TAVR) valve present in the aortic position. Procedure Date: 2021. 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 Aortic valve area, by VTI measures 1.42 cm. Aortic valve mean gradient  measures 15.5 mmHg. Aortic valve Vmax measures 2.50 m/s.  6. There is mild (Grade II) layered plaque involving the ascending aorta and aortic arch. Conclusion(s)/Recommendation(s):  Findings are concerning for vegetation/infective endocarditis as detailed above. Consider structural heart team evaluation. Suspect treatment will be prolonged course of antibiotic therapy and repeat imaging of the valve after. FINDINGS  Left Ventricle: Left ventricular ejection fraction, by estimation, is 55 to 60%. The left ventricle has normal function. The left ventricular internal cavity size was normal in size. There is mild left ventricular hypertrophy. Right Ventricle: The right ventricular size is normal. No increase in right ventricular wall thickness. Right ventricular systolic function is normal. Left Atrium: Left atrial size was normal in size. No left atrial/left atrial appendage thrombus was detected. Right Atrium: Right atrial size was normal in size. Pericardium: There is no evidence of pericardial effusion. Mitral Valve: The mitral valve is degenerative in appearance. There is moderate thickening of the mitral valve leaflet(s). There is moderate calcification of the posterior mitral valve leaflet(s). Mild to moderate mitral annular calcification. Mild mitral valve regurgitation. Tricuspid Valve: The tricuspid valve is grossly normal. Tricuspid valve regurgitation is trivial. Aortic Valve: Mobile filmentous mass in the LVOT adherant to the valve, likely vegetation. The aortic valve has been repaired/replaced. There is moderate thickening of the aortic valve. Aortic valve regurgitation is not visualized. Mild to moderate aortic stenosis is present. Aortic valve mean gradient measures 15.5 mmHg. Aortic valve peak gradient measures 25.1 mmHg. Aortic valve area, by VTI measures 1.42 cm. There is a 29 mm Sapien prosthetic, stented (TAVR) valve present in the aortic position. Procedure Date: 2021. Pulmonic Valve: The pulmonic valve was not well visualized. Pulmonic valve regurgitation is not visualized. Aorta: The aortic root and ascending aorta are structurally normal, with no evidence of dilitation.  There is mild (Grade II) layered plaque involving the ascending aotra and aortic arch. Venous: The left lower pulmonary vein and left upper pulmonary vein are normal. IAS/Shunts: No atrial level shunt detected by color flow Doppler. Additional Comments: Spectral Doppler performed. LEFT VENTRICLE PLAX 2D LVOT diam:     2.20 cm LV SV:         72 LV SV Index:   35 LVOT Area:     3.80 cm  AORTIC VALVE AV Area (Vmax):    1.91 cm AV Area (Vmean):   1.77 cm AV Area (VTI):     1.42  cm AV Vmax:           250.50 cm/s AV Vmean:          185.000 cm/s AV VTI:            0.508 m AV Peak Grad:      25.1 mmHg AV Mean Grad:      15.5 mmHg LVOT Vmax:         126.00 cm/s LVOT Vmean:        86.300 cm/s LVOT VTI:          0.190 m LVOT/AV VTI ratio: 0.37  AORTA Ao Asc diam: 4.00 cm  SHUNTS Systemic VTI:  0.19 m Systemic Diam: 2.20 cm Zoila Shutter MD Electronically signed by Zoila Shutter MD Signature Date/Time: 12/14/2022/2:41:36 PM    Final    EP STUDY  Result Date: 12/14/2022 See surgical note for result.  ECHOCARDIOGRAM LIMITED  Result Date: 12/12/2022    ECHOCARDIOGRAM LIMITED REPORT   Patient Name:   ERAGON HAMMOND Date of Exam: 12/12/2022 Medical Rec #:  [id number]       Height:       73.0 in Accession #:    [id number]      Weight:       179.9 lb Date of Birth:  [birth year]       BSA:          2.057 m Patient Age:    72 years        BP:           134/71 mmHg Patient Gender: M               HR:           57 bpm. Exam Location:  Inpatient Procedure: 2D Echo, Color Doppler and Cardiac Doppler Indications:    Bacteremia  History:        Patient has prior history of Echocardiogram examinations, most                 recent 11/19/2022. COPD, Arrythmias:Atrial Fibrillation,                 Signs/Symptoms:Fever; Risk Factors:Hypertension, Dyslipidemia                 and Diabetes.                 Aortic Valve: 29 mm Edwards Sapien prosthetic, stented (TAVR)                 valve is present in the aortic position. Procedure Date:  08/08/19.  Sonographer:    Delcie Roch RDCS Referring Phys: [phone] JACOB J STINSON IMPRESSIONS  1. Limited Echo to evaluate for infective endocarditis.  2. Left ventricular ejection fraction, by estimation, is 60 to 65%. The left ventricle has normal function. The left ventricle has no regional wall motion abnormalities. There is mild left ventricular hypertrophy. Left ventricular diastolic parameters are consistent with Grade I diastolic dysfunction (impaired relaxation).  3. Right ventricular systolic function is normal. The right ventricular size is normal. There is normal pulmonary artery systolic pressure.  4. The mitral valve is abnormal. Mild mitral valve regurgitation. No evidence of mitral stenosis. Severe mitral annular calcification. There is a mobile pedunculated echodensity measuring 0.2 x 0.3 cm attached to the ventricular surface of anterior mitral valve leaflet which is noted to be protruding into the LVOT. In some views, it appears to be attached to the aortic prosthesis, although, with no evidence  of aortic valvular dysfunction. Strongly consider TEE for further evaluation.  5. The aortic valve was not well visualized. Aortic valve regurgitation is not visualized. No aortic stenosis is present. There is a 29 mm Edwards Sapien prosthetic (TAVR) valve present in the aortic position. Procedure Date: 08/08/19. Aortic valve mean gradient measures 16.0 mmHg.  6. The inferior vena cava is normal in size with greater than 50% respiratory variability, suggesting right atrial pressure of 3 mmHg. Comparison(s): Changes from prior study are noted. Mobile echodensity noted, see above. FINDINGS  Left Ventricle: Left ventricular ejection fraction, by estimation, is 60 to 65%. The left ventricle has normal function. The left ventricle has no regional wall motion abnormalities. The left ventricular internal cavity size was normal in size. There is  mild left ventricular hypertrophy. Left ventricular diastolic  parameters are consistent with Grade I diastolic dysfunction (impaired relaxation). Right Ventricle: The right ventricular size is normal. No increase in right ventricular wall thickness. Right ventricular systolic function is normal. There is normal pulmonary artery systolic pressure. The tricuspid regurgitant velocity is 2.35 m/s, and  with an assumed right atrial pressure of 3 mmHg, the estimated right ventricular systolic pressure is 25.1 mmHg. Left Atrium: Left atrial size was not assessed. Right Atrium: Right atrial size was not assessed. Pericardium: There is no evidence of pericardial effusion. Mitral Valve: The mitral valve is abnormal. Severe mitral annular calcification. Mild mitral valve regurgitation. No evidence of mitral valve stenosis. Tricuspid Valve: The tricuspid valve is normal in structure. Tricuspid valve regurgitation is not demonstrated. No evidence of tricuspid stenosis. Aortic Valve: The aortic valve was not well visualized. Aortic valve regurgitation is not visualized. No aortic stenosis is present. Aortic valve mean gradient measures 16.0 mmHg. Aortic valve peak gradient measures 30.5 mmHg. Aortic valve area, by VTI measures 1.43 cm. There is a 29 mm Edwards Sapien prosthetic, stented (TAVR) valve present in the aortic position. Procedure Date: 08/08/19. Pulmonic Valve: The pulmonic valve was not well visualized. Pulmonic valve regurgitation is not visualized. No evidence of pulmonic stenosis. Aorta: Aortic root could not be assessed. Venous: The inferior vena cava is normal in size with greater than 50% respiratory variability, suggesting right atrial pressure of 3 mmHg. IAS/Shunts: No atrial level shunt detected by color flow Doppler. Additional Comments: Spectral Doppler performed. Color Doppler performed.  LEFT VENTRICLE PLAX 2D LVIDd:         5.10 cm LVIDs:         3.20 cm LV PW:         1.30 cm LV IVS:        1.20 cm LVOT diam:     2.10 cm LV SV:         86 LV SV Index:   42 LVOT  Area:     3.46 cm  IVC IVC diam: 1.60 cm LEFT ATRIUM         Index LA diam:    3.60 cm 1.75 cm/m  AORTIC VALVE AV Area (Vmax):    1.33 cm AV Area (Vmean):   1.29 cm AV Area (VTI):     1.43 cm AV Vmax:           276.00 cm/s AV Vmean:          183.000 cm/s AV VTI:            0.603 m AV Peak Grad:      30.5 mmHg AV Mean Grad:      16.0 mmHg LVOT Vmax:  106.00 cm/s LVOT Vmean:        68.350 cm/s LVOT VTI: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

## 2022-12-14 NOTE — CV Procedure (Signed)
TRANSESOPHAGEAL ECHOCARDIOGRAM (TEE) NOTE  INDICATIONS: bioprosthetic valve infective endocarditis  PROCEDURE:   Informed consent was obtained prior to the procedure. The risks, benefits and alternatives for the procedure were discussed and the patient comprehended these risks.  Risks include, but are not limited to, cough, sore throat, vomiting, nausea, somnolence, esophageal and stomach trauma or perforation, bleeding, low blood pressure, aspiration, pneumonia, infection, trauma to the teeth and death.    After a procedural time-out, the patient was given propofol for sedation by anesthesia. See their separate report.  The patient's heart rate, blood pressure, and oxygen saturation are monitored continuously during the procedure.The oropharynx was anesthetized with topical cetacaine.  The transesophageal probe was inserted in the esophagus and stomach without difficulty and multiple views were obtained.  The patient was kept under observation until the patient left the procedure room.  I was present face-to-face 100% of this time. The patient left the procedure room in stable condition.   Agitated microbubble saline contrast was not administered.  COMPLICATIONS:    There were no immediate complications.  Findings:  LEFT VENTRICLE: The left ventricular wall thickness is mildly increased.  The left ventricular cavity is normal in size. Wall motion is normal.  LVEF is 55-60%.  RIGHT VENTRICLE:  The right ventricle is normal in structure and function without any thrombus or masses.    LEFT ATRIUM:  The left atrium is mildly dilated in size without any thrombus or masses.  There is not spontaneous echo contrast ("smoke") in the left atrium consistent with a low flow state.  LEFT ATRIAL APPENDAGE:  The left atrial appendage is free of any thrombus or masses. The appendage has single lobes. Pulse doppler indicates moderate flow in the appendage.  ATRIAL SEPTUM:  The atrial septum appears  intact and is free of thrombus and/or masses.  There is no evidence for interatrial shunting by color doppler.  RIGHT ATRIUM:  The right atrium is normal in size and function without any thrombus or masses.  MITRAL VALVE:  The mitral valve demonstrates annular calcification with thickening and calcification of both leaflet and Mild regurgitation.  There were no obvious vegetations (images compared with echo on 11/19/22 and there is no change) or stenosis.  AORTIC VALVE:  The aortic valve is s/p TAVR with a 29 mm Edwards Sapien.  There is thickening of the bioprosthetic valve leaflets with restricted motion, suggestive of prosthetic valve endocarditis. There is a small filamentous mass noted on the LVOT side of the valve, consistent with mobile vegetation. There is  no  regurgitation.  There is probably mild aortic valve stenosis. No perivalvular leak or abscess is noted. The valve is well-seated and stable.  TRICUSPID VALVE:  The tricuspid valve is normal in structure and function with  trivial  regurgitation.  There were no vegetations or stenosis   PULMONIC VALVE:  The pulmonic valve was poorly visualized.   AORTIC ARCH, ASCENDING AND DESCENDING AORTA:  There was grade 2 Myrtis Ser et. Al, 1992) atherosclerosis of the ascending aorta and aortic arch.  12. PULMONARY VEINS: Anomalous pulmonary venous return was not noted.  13. PERICARDIUM: The pericardium appeared normal and non-thickened.  There is no pericardial effusion.  IMPRESSION:   Infective endocarditis of the TAVR valve was noted with a small mobile vegetation in the LVOT - there is at least mild prosthetic valve stenosis and no AI. No LAA thrombus Negative for PFO by color doppler Mild LVH LVEF 55-60%  RECOMMENDATIONS:    Would consider  formal structural heart team evaluation. Suspect recommendations would be for antibiotic therapy at this time, given that there is no significant AI or evidence for peri-valvular abscess, leak or  instability.   Time Spent Directly with the Patient:  60 minutes   Chrystie Nose, MD, [facility], FACP  [facility]  Medical Director of the [facility] &  Cardiovascular Risk Reduction Clinic Diplomate of the American Board of Clinical Lipidology Attending Cardiologist  Direct Dial: [phone]  Fax: [phone]  Website:  [email or web address] 12/14/2022, 12:24 PM

## 2022-12-14 NOTE — Progress Notes (Signed)
Physical Therapy Treatment Patient Details Name: [name] MRN: [id number] DOB: [birth year] Today's Date: 12/14/2022   History of Present Illness Pt is a 72 y.o. male admitted s/p fall and recent + blood cultures for  Streptococcus infantarius.  Pt with PMH significant of history of TAVR, COPD, mild dementia, GERD, history of CVA, diabetes, hypertension, atrial fibrillation on anticoagulation.  Patient with recent admission due to fever. He also relates a history of falling.  He falls once twice a week where he loses his balance backwards because he feels lightheaded. This has been going on for the past few months.  He has been referred to cardiology and neurology for evaluation of his falls.    PT Comments  Patient denies dizziness or lightheadedness prior to his falls at home. Reports he "just loses his balance backwards." Reports has fallen with and without RW use. Patient able to progress ambulation distance, however remains limited by fatigue. During transfer to transport chair, as pt stepping back toward seat, he had imbalance backwards with uncontrolled descent requiring assist to slow descent. "That's how it always happens!" Session shortened due to transport arrival to take pt for TEE.      Assistance Recommended at Discharge Frequent or constant Supervision/Assistance  If plan is discharge home, recommend the following:  Can travel by private vehicle    A little help with walking and/or transfers;A little help with bathing/dressing/bathroom      Equipment Recommendations  None recommended by PT    Recommendations for Other Services       Precautions / Restrictions Precautions Precautions: Fall Precaution Comments: denies dizziness when he falls, "just get off balance and fall backwards" Restrictions Weight Bearing Restrictions: No     Mobility  Bed Mobility Overal bed mobility: Modified Independent             General bed mobility comments: moves slowly, used  railing    Transfers Overall transfer level: Needs assistance Equipment used: Rolling walker (2 wheels) Transfers: Sit to/from Stand Sit to Stand: Min guard           General transfer comment: vc for proper hand placement; repeated x 3 reps with +recall on 2-3 attempts; on transfer to transport chair, pt with posterior imbalance as starting to sit with assist to control descent    Ambulation/Gait Ambulation/Gait assistance: Min guard Gait Distance (Feet): 90 Feet Assistive device: Rolling walker (2 wheels) Gait Pattern/deviations: Step-through pattern, Trunk flexed Gait velocity: slow pace     General Gait Details: slow advancement; no imbalance during ambulation; guarding for safety due to ++h/o falls   Stairs             Wheelchair Mobility     Tilt Bed    Modified Rankin (Stroke Patients Only)       Balance Overall balance assessment: Needs assistance Sitting-balance support: Feet supported, No upper extremity supported Sitting balance-Leahy Scale: Good     Standing balance support: Bilateral upper extremity supported, Reliant on assistive device for balance Standing balance-Leahy Scale: Poor                              Cognition Arousal/Alertness: Awake/alert Behavior During Therapy: WFL for tasks assessed/performed Overall Cognitive Status: Within Functional Limits for tasks assessed  Exercises Other Exercises Other Exercises: sit to stand x 3 reps and then transport arrived to take to TEE    General Comments        Pertinent Vitals/Pain Pain Assessment Pain Assessment: No/denies pain    Home Living                          Prior Function            PT Goals (current goals can now be found in the care plan section) Acute Rehab PT Goals Patient Stated Goal: figure out why I fall Time For Goal Achievement: 12/26/22 Potential to Achieve Goals:  Good Progress towards PT goals: Progressing toward goals    Frequency    Min 2X/week      PT Plan Discharge plan needs to be updated    Co-evaluation              AM-PAC PT "6 Clicks" Mobility   Outcome Measure  Help needed turning from your back to your side while in a flat bed without using bedrails?: A Little Help needed moving from lying on your back to sitting on the side of a flat bed without using bedrails?: A Little Help needed moving to and from a bed to a chair (including a wheelchair)?: A Little Help needed standing up from a chair using your arms (e.g., wheelchair or bedside chair)?: A Little Help needed to walk in hospital room?: A Little Help needed climbing 3-5 steps with a railing? : A Little 6 Click Score: 18    End of Session Equipment Utilized During Treatment: Gait belt Activity Tolerance: Patient limited by fatigue Patient left: in chair;Other (comment) (with transporter)   PT Visit Diagnosis: Repeated falls (R29.6);Other abnormalities of gait and mobility (R26.89);History of falling (Z91.81);Unsteadiness on feet (R26.81)     Time: 0981-1914 PT Time Calculation (min) (ACUTE ONLY): 17 min  Charges:    $Gait Training: 8-22 mins PT General Charges $$ ACUTE PT VISIT: 1 Visit                      Jerolyn Center, PT Acute Rehabilitation Services  Office [phone]    Zena Amos 12/14/2022, 11:13 AM

## 2022-12-14 NOTE — Interval H&P Note (Signed)
History and Physical Interval Note:  12/14/2022 11:31 AM  [name]  has presented today for surgery, with the diagnosis of bacteremia.  The various methods of treatment have been discussed with the patient and family. After consideration of risks, benefits and other options for treatment, the patient has consented to  Procedure(s): TRANSESOPHAGEAL ECHOCARDIOGRAM (N/A) as a surgical intervention.  The patient's history has been reviewed, patient examined, no change in status, stable for surgery.  I have reviewed the patient's chart and labs.  Questions were answered to the patient's satisfaction.     Chrystie Nose

## 2022-12-14 NOTE — Anesthesia Preprocedure Evaluation (Addendum)
Anesthesia Evaluation  Patient identified by MRN, date of birth, ID band Patient awake    Reviewed: Allergy & Precautions, NPO status , Patient's Chart, lab work & pertinent test results  History of Anesthesia Complications (+) PONV and history of anesthetic complications  Airway Mallampati: II  TM Distance: >3 FB Neck ROM: Limited    Dental  (+) Edentulous Upper, Edentulous Lower, Dental Advisory Given   Pulmonary asthma , COPD   breath sounds clear to auscultation + decreased breath sounds      Cardiovascular hypertension, Pt. on medications + Peripheral Vascular Disease and +CHF  + dysrhythmias Atrial Fibrillation  Rhythm:Regular Rate:Normal + Systolic murmurs Echo 12/12/2022  1. Limited Echo to evaluate for infective endocarditis.   2. Left ventricular ejection fraction, by estimation, is 60 to 65%. The left ventricle has normal function. The left ventricle has no regional wall motion abnormalities. There is mild left ventricular hypertrophy. Left ventricular diastolic parameters are consistent with Grade I diastolic dysfunction (impaired relaxation).   3. Right ventricular systolic function is normal. The right ventricular size is normal. There is normal pulmonary artery systolic pressure.   4. The mitral valve is abnormal. Mild mitral valve regurgitation. No evidence of mitral stenosis. Severe mitral annular calcification. There is a mobile pedunculated echodensity measuring 0.2 x 0.3 cm attached to the ventricular surface of anterior  mitral valve leaflet which is noted to be protruding into the LVOT. In some views, it appears to be attached to the aortic prosthesis, although, with no evidence of aortic valvular dysfunction. Strongly consider TEE for further evaluation.   5. The aortic valve was not well visualized. Aortic valve regurgitation is not visualized. No aortic stenosis is present. There is a 29 mm Edwards Sapien prosthetic  (TAVR) valve present in the aortic position. Procedure Date: 08/08/19. Aortic valve mean gradient measures 16.0 mmHg.   6. The inferior vena cava is normal in size with greater than 50%  respiratory variability, suggesting right atrial pressure of 3 mmHg.   Comparison(s): Changes from prior study are noted. Mobile echodensity noted, see above.     Neuro/Psych Seizures -,  PSYCHIATRIC DISORDERS Anxiety Depression   Dementia    GI/Hepatic Neg liver ROS,GERD  ,,  Endo/Other  diabetesHypothyroidism    Renal/GU Renal disease     Musculoskeletal  (+) Arthritis ,    Abdominal   Peds  Hematology  (+) Blood dyscrasia, anemia   Anesthesia Other Findings   Reproductive/Obstetrics                             Anesthesia Physical Anesthesia Plan  ASA: 3  Anesthesia Plan: MAC   Post-op Pain Management:    Induction: Intravenous  PONV Risk Score and Plan: 2 and Propofol infusion, TIVA and Treatment may vary due to age or medical condition  Airway Management Planned: Natural Airway  Additional Equipment:   Intra-op Plan:   Post-operative Plan:   Informed Consent: I have reviewed the patients History and Physical, chart, labs and discussed the procedure including the risks, benefits and alternatives for the proposed anesthesia with the patient or authorized representative who has indicated his/her understanding and acceptance.     Dental advisory given  Plan Discussed with: CRNA  Anesthesia Plan Comments:        Anesthesia Quick Evaluation

## 2022-12-14 NOTE — Transfer of Care (Signed)
Immediate Anesthesia Transfer of Care Note  Patient: MYRON STANKOVICH  Procedure(s) Performed: TRANSESOPHAGEAL ECHOCARDIOGRAM  Patient Location: Cath Lab  Anesthesia Type:MAC  Level of Consciousness: drowsy and patient cooperative  Airway & Oxygen Therapy: Patient Spontanous Breathing and Patient connected to nasal cannula oxygen  Post-op Assessment: Report given to RN and Post -op Vital signs reviewed and stable  Post vital signs: Reviewed and stable  Last Vitals:  Vitals Value Taken Time  BP 81/59 12/14/22 1227  Temp    Pulse 59 12/14/22 1230  Resp 16 12/14/22 1230  SpO2 98 % 12/14/22 1230  Vitals shown include unfiled device data.  Last Pain:  Vitals:   12/14/22 1108  TempSrc: Temporal  PainSc: 4          Complications: No notable events documented.

## 2022-12-14 NOTE — Progress Notes (Signed)
PROGRESS NOTE    [name]  NGE:[id number] DOB: 11-05-1950 DOA: 12/11/2022 PCP: [name], MD    Chief Complaint  Patient presents with   Abnormal labs    Brief Narrative:    [name] is a 72 y.o. male with medical history significant of history of TAVR, COPD, mild dementia, GERD, history of CVA, diabetes, hypertension, atrial fibrillation on anticoagulation.  Patient presents after being hospitalized overnight due to fever.  He was discharged home.  His blood cultures turned positive for Streptococcus infantarius.  He otherwise feels fine and without any more problems.  He also relates a history of falling.  He falls once twice a week where he loses his balance because he feels lightheaded.  He does not know whether this is the result of being in atrial fibrillation or whether there is something else going on.  This has been going on for the past few months.  He has been referred to cardiology and neurology for evaluation of his falls.  He currently denies fevers, chills, nausea, vomiting, abdominal pain.  He denies chest pain or shortness of breath.   Assessment & Plan:  Principal Problem:   Bacteremia Active Problems:   Diabetes mellitus type 2, controlled, with complications (HCC)   Hypertensive heart disease with chronic diastolic congestive heart failure (HCC)   GERD   COPD mixed type (HCC)   S/P TAVR (transcatheter aortic valve replacement)   Essential hypertension   Acquired hypothyroidism   Streptococcus bacteremia, possible mitral endocarditis -so far NO Clear etiology -Continue with IV Rocephin -Follow on repeat blood cultures, surveillance cultures remain negative at 48 hours -2D echo is significant of mobile mass and mitral valve appears to be attached to aortic prosthesis and some few, TEE is pending, plan for today -ID consult appreciated  Falls -Continue with PT/OT  Hypertension - Continue blood pressure medications  History of  TAVR  COPD - Continue long-acting inhalers  Essential hypertension - Continue Coreg, losartan   Mixed hyperlipidemia - Continue Crestor   Persistent atrial fibrillation - Continue Coreg, Eliquis   Acquired hypothyroidism - Continue Synthroid  Hyponatremia - improving     DVT prophylaxis: Eliquis Code Status: Full Family Communication: None at bedside Disposition:   Status is: Inpatient  Consultants:  ID   Subjective:  No significant events overnight, he denies any complaints, reports he has been n.p.o.  Objective: Vitals:   12/14/22 0400 12/14/22 0803 12/14/22 0838 12/14/22 1108  BP: (!) 162/75 (!) 161/88  (!) 146/96  Pulse: 60 60  76  Resp: 16 15  16   Temp: 98.4 F (36.9 C) 97.8 F (36.6 C)  97.9 F (36.6 C)  TempSrc: Oral Oral  Temporal  SpO2: 100% 98% 98% 100%  Weight:        Intake/Output Summary (Last 24 hours) at 12/14/2022 1135 Last data filed at 12/14/2022 0228 Gross per 24 hour  Intake 450 ml  Output 900 ml  Net -450 ml   Filed Weights   12/11/22 2100  Weight: 81.6 kg    Examination:  Awake Alert, Oriented X 3, frail, deconditioned Symmetrical Chest wall movement, Good air movement bilaterally, CTAB RRR,No Gallops,Rubs or new Murmurs, No Parasternal Heave +ve B.Sounds, Abd Soft, No tenderness, No rebound - guarding or rigidity. No Cyanosis, Clubbing or edema, No new Rash or bruise         Data Reviewed: I have personally reviewed following labs and imaging studies  CBC: Recent Labs  Lab 12/09/22 2056  12/10/22 1096 12/11/22 1649 12/12/22 0535 12/13/22 0302 12/14/22 0549  WBC 15.4* 10.6* 9.6 7.1 9.4 11.0*  NEUTROABS 13.1*  --  6.7  --   --   --   HGB 10.7* 9.8* 10.4* 10.1* 9.6* 9.9*  HCT 33.2* 30.5* 32.7* 32.2* 30.2* 30.9*  MCV 74.8* 75.1* 76.2* 75.6* 76.6* 73.9*  PLT 318 271 338 315 341 372    Basic Metabolic Panel: Recent Labs  Lab 12/10/22 0623 12/11/22 1649 12/12/22 0535 12/13/22 0302 12/14/22 0549  NA  126* 129* 131* 128* 133*  K 3.5 4.0 4.0 3.9 4.3  CL 97* 96* 99 99 98  CO2 23 25 23 22 23   GLUCOSE 110* 101* 89 100* 86  BUN 13 9 5* 11 13  CREATININE 0.73 0.78 0.68 0.75 0.85  CALCIUM 8.3* 8.8* 8.9 8.5* 9.1  MG 1.9  --   --   --   --   PHOS 4.1  --   --  3.3  --     GFR: Estimated Creatinine Clearance: 88.8 mL/min (by C-G formula based on SCr of 0.85 mg/dL).  Liver Function Tests: Recent Labs  Lab 12/09/22 2056 12/10/22 0623 12/11/22 1649  AST 18 13* 17  ALT 15 12 14   ALKPHOS 91 75 88  BILITOT 1.0 1.0 0.6  PROT 8.0 7.0 8.2*  ALBUMIN 2.8* 2.3* 2.8*    CBG: Recent Labs  Lab 12/13/22 0829 12/13/22 1151 12/13/22 1741 12/13/22 2206 12/14/22 0801  GLUCAP 94 131* 133* 107* 82     Recent Results (from the past 240 hour(s))  Culture, blood (Routine x 2)     Status: Abnormal   Collection Time: 12/09/22  8:56 PM   Specimen: BLOOD RIGHT FOREARM  Result Value Ref Range Status   Specimen Description   Final    BLOOD RIGHT FOREARM Performed at [facility], [address] [place], [place] [address]    Special Requests   Final    BOTTLES DRAWN AEROBIC AND ANAEROBIC Blood Culture results may not be optimal due to an excessive volume of blood received in culture bottles Performed at [facility], [address], [place], [place] [address]    Culture  Setup Time   Final    GRAM POSITIVE COCCI IN BOTH AEROBIC AND ANAEROBIC BOTTLES Gram Stain Report Called to,Read Back By and Verified With: [facility] @ 1517 ON 12/10/2022 BY FRATTO,ASHLEY CRITICAL RESULT CALLED TO, READ BACK BY AND VERIFIED WITH: RN Marisa Hua [phone] @2242  FH Performed at [facility], [address] [place], [place] [address]    Culture STREPTOCOCCUS INFANTARIUS (A)  Final   Report Status 12/14/2022 FINAL  Final   Organism ID, Bacteria STREPTOCOCCUS INFANTARIUS  Final      Susceptibility   Streptococcus infantarius - MIC*    ERYTHROMYCIN <=0.12 SENSITIVE Sensitive     TETRACYCLINE <=0.25 SENSITIVE  Sensitive     VANCOMYCIN 0.5 SENSITIVE Sensitive     CLINDAMYCIN <=0.25 SENSITIVE Sensitive     PENICILLIN 0.25 INTERMEDIATE Intermediate     CEFTRIAXONE 0.25 SENSITIVE Sensitive     LEVOFLOXACIN 2 SENSITIVE Sensitive     * STREPTOCOCCUS INFANTARIUS  Blood Culture ID Panel (Reflexed)     Status: Abnormal   Collection Time: 12/09/22  8:56 PM  Result Value Ref Range Status   Enterococcus faecalis NOT DETECTED NOT DETECTED Final   Enterococcus Faecium NOT DETECTED NOT DETECTED Final   Listeria monocytogenes NOT DETECTED NOT DETECTED Final   Staphylococcus species NOT DETECTED NOT DETECTED  Final   Staphylococcus aureus (BCID) NOT DETECTED NOT DETECTED Final   Staphylococcus epidermidis NOT DETECTED NOT DETECTED Final   Staphylococcus lugdunensis NOT DETECTED NOT DETECTED Final   Streptococcus species DETECTED (A) NOT DETECTED Final    Comment: Not Enterococcus species, Streptococcus agalactiae, Streptococcus pyogenes, or Streptococcus pneumoniae. CRITICAL RESULT CALLED TO, READ BACK BY AND VERIFIED WITH: RN C. TURNER [phone] @2242  FH    Streptococcus agalactiae NOT DETECTED NOT DETECTED Final   Streptococcus pneumoniae NOT DETECTED NOT DETECTED Final   Streptococcus pyogenes NOT DETECTED NOT DETECTED Final   A.calcoaceticus-baumannii NOT DETECTED NOT DETECTED Final   Bacteroides fragilis NOT DETECTED NOT DETECTED Final   Enterobacterales NOT DETECTED NOT DETECTED Final   Enterobacter cloacae complex NOT DETECTED NOT DETECTED Final   Escherichia coli NOT DETECTED NOT DETECTED Final   Klebsiella aerogenes NOT DETECTED NOT DETECTED Final   Klebsiella oxytoca NOT DETECTED NOT DETECTED Final   Klebsiella pneumoniae NOT DETECTED NOT DETECTED Final   Proteus species NOT DETECTED NOT DETECTED Final   Salmonella species NOT DETECTED NOT DETECTED Final   Serratia marcescens NOT DETECTED NOT DETECTED Final   Haemophilus influenzae NOT DETECTED NOT DETECTED Final   Neisseria meningitidis NOT  DETECTED NOT DETECTED Final   Pseudomonas aeruginosa NOT DETECTED NOT DETECTED Final   Stenotrophomonas maltophilia NOT DETECTED NOT DETECTED Final   Candida albicans NOT DETECTED NOT DETECTED Final   Candida auris NOT DETECTED NOT DETECTED Final   Candida glabrata NOT DETECTED NOT DETECTED Final   Candida krusei NOT DETECTED NOT DETECTED Final   Candida parapsilosis NOT DETECTED NOT DETECTED Final   Candida tropicalis NOT DETECTED NOT DETECTED Final   Cryptococcus neoformans/gattii NOT DETECTED NOT DETECTED Final    Comment: Performed at [facility], [address] [place], [place] [address]  Culture, blood (Routine x 2)     Status: Abnormal   Collection Time: 12/09/22  9:25 PM   Specimen: BLOOD LEFT FOREARM  Result Value Ref Range Status   Specimen Description   Final    BLOOD LEFT FOREARM Performed at [facility], [address], [place], [place] [address]    Special Requests   Final    BOTTLES DRAWN AEROBIC AND ANAEROBIC Blood Culture adequate volume Performed at [facility], [address], [place], [place] [address]    Culture  Setup Time   Final    GRAM POSITIVE COCCI IN BOTH AEROBIC AND ANAEROBIC BOTTLES Gram Stain Report Called to,Read Back By and Verified With: TURNER, CHARLOTTE @ 1618 ON 12/10/2022 BY Yehuda Savannah Performed at [facility], [address] [place], [place] [address]    Culture (A)  Final    STREPTOCOCCUS INFANTARIUS SUSCEPTIBILITIES PERFORMED ON PREVIOUS CULTURE WITHIN THE LAST 5 DAYS. Performed at [facility], [address] [place], [place] [address]    Report Status 12/12/2022 FINAL  Final  Resp panel by RT-PCR (RSV, Flu A&B, Covid) Anterior Nasal Swab     Status: None   Collection Time: 12/09/22 10:43 PM   Specimen: Anterior Nasal Swab  Result Value Ref Range Status   SARS Coronavirus 2 by RT PCR NEGATIVE NEGATIVE Final    Comment: (NOTE) SARS-CoV-2 target nucleic acids are NOT DETECTED.  The SARS-CoV-2 RNA is  generally detectable in upper respiratory specimens during the acute phase of infection. The lowest concentration of SARS-CoV-2 viral copies this assay can detect is 138 copies/mL. A negative result does not preclude SARS-Cov-2 infection and should not be  used as the sole basis for treatment or other patient management decisions. A negative result may occur with  improper specimen collection/handling, submission of specimen other than nasopharyngeal swab, presence of viral mutation(s) within the areas targeted by this assay, and inadequate number of viral copies(<138 copies/mL). A negative result must be combined with clinical observations, patient history, and epidemiological information. The expected result is Negative.  Fact Sheet for Patients:  BloggerCourse.com  Fact Sheet for Healthcare Providers:  SeriousBroker.it  This test is no t yet approved or cleared by the [place] FDA and  has been authorized for detection and/or diagnosis of SARS-CoV-2 by FDA under an Emergency Use Authorization (EUA). This EUA will remain  in effect (meaning this test can be used) for the duration of the COVID-19 declaration under Section 564(b)(1) of the Act, 21 U.S.C.section 360bbb-3(b)(1), unless the authorization is terminated  or revoked sooner.       Influenza A by PCR NEGATIVE NEGATIVE Final   Influenza B by PCR NEGATIVE NEGATIVE Final    Comment: (NOTE) The Xpert Xpress SARS-CoV-2/FLU/RSV plus assay is intended as an aid in the diagnosis of influenza from Nasopharyngeal swab specimens and should not be used as a sole basis for treatment. Nasal washings and aspirates are unacceptable for Xpert Xpress SARS-CoV-2/FLU/RSV testing.  Fact Sheet for Patients: BloggerCourse.com  Fact Sheet for Healthcare Providers: SeriousBroker.it  This test is not yet approved or cleared by the [place] FDA and has been authorized for detection and/or diagnosis of SARS-CoV-2 by FDA under an Emergency Use Authorization (EUA). This EUA will remain in effect (meaning this test can be used) for the duration of the COVID-19 declaration under Section 564(b)(1) of the Act, 21 U.S.C. section 360bbb-3(b)(1), unless the authorization is terminated or revoked.     Resp Syncytial Virus by PCR NEGATIVE NEGATIVE Final    Comment: (NOTE) Fact Sheet for Patients: BloggerCourse.com  Fact Sheet for Healthcare Providers: SeriousBroker.it  This test is not yet approved or cleared by the [place] FDA and has been authorized for detection and/or diagnosis of SARS-CoV-2 by FDA under an Emergency Use Authorization (EUA). This EUA will remain in effect (meaning this test can be used) for the duration of the COVID-19 declaration under Section 564(b)(1) of the Act, 21 U.S.C. section 360bbb-3(b)(1), unless the authorization is terminated or revoked.  Performed at [facility], [address], [place], [place] [address]   Culture, blood (routine x 2)     Status: None (Preliminary result)   Collection Time: 12/11/22  4:49 PM   Specimen: Left Antecubital; Blood  Result Value Ref Range Status   Specimen Description LEFT ANTECUBITAL  Final   Special Requests   Final    BOTTLES DRAWN AEROBIC AND ANAEROBIC Blood Culture adequate volume   Culture   Final    NO GROWTH 3 DAYS Performed at [facility], [address] [place], [place] [address]    Report Status PENDING  Incomplete  Culture, blood (routine x 2)     Status: None (Preliminary result)   Collection Time: 12/11/22  4:49 PM   Specimen: Right Antecubital; Blood  Result Value Ref Range Status   Specimen Description RIGHT ANTECUBITAL  Final   Special Requests   Final    BOTTLES DRAWN AEROBIC AND ANAEROBIC Blood Culture adequate volume   Culture   Final    NO GROWTH 3 DAYS Performed at [facility], [address], [place], [place] [address]    Report Status PENDING  Incomplete         Radiology Studies: ECHOCARDIOGRAM LIMITED  Result Date: 12/12/2022    ECHOCARDIOGRAM LIMITED REPORT   Patient Name:   DEIONDRE HARROWER Date of Exam: 12/12/2022 Medical Rec #:  [id number]       Height:       73.0 in Accession #:    [id number]      Weight:       179.9 lb Date of Birth:  16-Jan-1951       BSA:          2.057 m Patient Age:    72 years        BP:           134/71 mmHg Patient Gender: M               HR:           57 bpm. Exam Location:  Inpatient Procedure: 2D Echo, Color Doppler and Cardiac Doppler Indications:    Bacteremia  History:        Patient has prior history of Echocardiogram examinations, most                 recent 11/19/2022. COPD, Arrythmias:Atrial Fibrillation,                 Signs/Symptoms:Fever; Risk Factors:Hypertension, Dyslipidemia                 and Diabetes.                 Aortic Valve: 29 mm Edwards Sapien prosthetic, stented (TAVR)                 valve is present in the aortic position. Procedure Date: 08/08/19.  Sonographer:    Delcie Roch RDCS Referring Phys: [phone] JACOB J STINSON IMPRESSIONS  1. Limited Echo to evaluate for infective endocarditis.  2. Left ventricular ejection fraction, by estimation, is 60 to 65%. The left ventricle has normal function. The left ventricle has no regional wall motion abnormalities. There is mild left ventricular hypertrophy. Left ventricular diastolic parameters are consistent with Grade I diastolic dysfunction (impaired relaxation).  3. Right ventricular systolic function is normal. The right ventricular size is normal. There is normal pulmonary artery systolic pressure.  4. The mitral valve is abnormal. Mild mitral valve regurgitation. No evidence of mitral stenosis. Severe mitral annular calcification. There is a mobile pedunculated echodensity measuring 0.2 x 0.3 cm attached to the ventricular surface of anterior mitral valve  leaflet which is noted to be protruding into the LVOT. In some views, it appears to be attached to the aortic prosthesis, although, with no evidence of aortic valvular dysfunction. Strongly consider TEE for further evaluation.  5. The aortic valve was not well visualized. Aortic valve regurgitation is not visualized. No aortic stenosis is present. There is a 29 mm Edwards Sapien prosthetic (TAVR) valve present in the aortic position. Procedure Date: 08/08/19. Aortic valve mean gradient measures 16.0 mmHg.  6. The inferior vena cava is normal in size with greater than 50% respiratory variability, suggesting right atrial pressure of 3 mmHg. Comparison(s): Changes from prior study are noted. Mobile echodensity noted, see above. FINDINGS  Left Ventricle: Left ventricular ejection fraction, by estimation, is 60 to 65%. The left ventricle has normal function. The left ventricle has no regional wall motion abnormalities. The left ventricular internal cavity size was normal in size. There is  mild left ventricular hypertrophy. Left ventricular diastolic parameters are consistent  with Grade I diastolic dysfunction (impaired relaxation). Right Ventricle: The right ventricular size is normal. No increase in right ventricular wall thickness. Right ventricular systolic function is normal. There is normal pulmonary artery systolic pressure. The tricuspid regurgitant velocity is 2.35 m/s, and  with an assumed right atrial pressure of 3 mmHg, the estimated right ventricular systolic pressure is 25.1 mmHg. Left Atrium: Left atrial size was not assessed. Right Atrium: Right atrial size was not assessed. Pericardium: There is no evidence of pericardial effusion. Mitral Valve: The mitral valve is abnormal. Severe mitral annular calcification. Mild mitral valve regurgitation. No evidence of mitral valve stenosis. Tricuspid Valve: The tricuspid valve is normal in structure. Tricuspid valve regurgitation is not demonstrated. No evidence of  tricuspid stenosis. Aortic Valve: The aortic valve was not well visualized. Aortic valve regurgitation is not visualized. No aortic stenosis is present. Aortic valve mean gradient measures 16.0 mmHg. Aortic valve peak gradient measures 30.5 mmHg. Aortic valve area, by VTI measures 1.43 cm. There is a 29 mm Edwards Sapien prosthetic, stented (TAVR) valve present in the aortic position. Procedure Date: 08/08/19. Pulmonic Valve: The pulmonic valve was not well visualized. Pulmonic valve regurgitation is not visualized. No evidence of pulmonic stenosis. Aorta: Aortic root could not be assessed. Venous: The inferior vena cava is normal in size with greater than 50% respiratory variability, suggesting right atrial pressure of 3 mmHg. IAS/Shunts: No atrial level shunt detected by color flow Doppler. Additional Comments: Spectral Doppler performed. Color Doppler performed.  LEFT VENTRICLE PLAX 2D LVIDd:         5.10 cm LVIDs:         3.20 cm LV PW:         1.30 cm LV IVS:        1.20 cm LVOT diam:     2.10 cm LV SV:         86 LV SV Index:   42 LVOT Area:     3.46 cm  IVC IVC diam: 1.60 cm LEFT ATRIUM         Index LA diam:    3.60 cm 1.75 cm/m  AORTIC VALVE AV Area (Vmax):    1.33 cm AV Area (Vmean):   1.29 cm AV Area (VTI):     1.43 cm AV Vmax:           276.00 cm/s AV Vmean:          183.000 cm/s AV VTI:            0.603 m AV Peak Grad:      30.5 mmHg AV Mean Grad:      16.0 mmHg LVOT Vmax:         106.00 cm/s LVOT Vmean:        68.350 cm/s LVOT VTI:          0.250 m LVOT/AV VTI ratio: 0.41 MITRAL VALVE                TRICUSPID VALVE MV Area (PHT): 2.54 cm     TR Peak grad:   22.1 mmHg MV Decel Time: 299 msec     TR Vmax:        235.00 cm/s MV E velocity: 114.00 cm/s MV A velocity: 174.00 cm/s  SHUNTS MV E/A ratio:  0.66         Systemic VTI:  0.25 m  Systemic Diam: 2.10 cm Vishnu Priya Mallipeddi Electronically signed by Winfield Rast Mallipeddi Signature Date/Time: 12/12/2022/4:20:23 PM     Final         Scheduled Meds:  apixaban  5 mg Oral BID   carvedilol  25 mg Oral BID WC   feeding supplement (GLUCERNA SHAKE)  237 mL Oral TID BM   fluticasone furoate-vilanterol  1 puff Inhalation Daily   insulin aspart  0-5 Units Subcutaneous QHS   insulin aspart  0-9 Units Subcutaneous TID WC   levothyroxine  25 mcg Oral Daily   losartan  100 mg Oral Daily   rosuvastatin  20 mg Oral Daily   sertraline  150 mg Oral Daily   spironolactone  12.5 mg Oral Daily   Continuous Infusions:  cefTRIAXone (ROCEPHIN)  IV 2 g (12/13/22 1736)   gentamicin       LOS: 3 days     Huey Bienenstock, MD [facility]   To contact the attending provider between 7A-7P or the covering provider during after hours 7P-7A, please log into the web site [email or web address] and access using universal [place] password for that web site. If you do not have the password, please call the hospital operator.  12/14/2022, 11:35 AM

## 2022-12-14 NOTE — TOC Initial Note (Signed)
Transition of Care [facility]) - Initial/Assessment Note    Patient Details  Name: [name] MRN: [id number] Date of Birth: 1951-04-03  Transition of Care [facility]) CM/SW Contact:    Gordy Clement, RN Phone Number: 12/14/2022, 4:16 PM  Clinical Narrative:        CM met with patient and his wife bedside for initial assessment . Patient has a PCP at the [facility] and states he is 70 % service connected. [facility] (April  [phone] X [phone]) Left VM . Will re attempt. Patient is requesting additional services in the home- PCS/ Meals on Wheels/ possible transportation needs.His Wife currently drives them to appointments etc but she is a cancer patient and sometimes her reserve is depleted.   Patient will be discharging on IV ABX. [facility] will provide infusion and [facility] will provide HH SN /PT and OT (if recommended)   Wife will transport patient at discount   [facility] will continue to follow patient for any additional discharge needs         Expected Discharge Plan: Home w Home Health Services Barriers to Discharge: Continued Medical Work up   Patient Goals and CMS Choice Patient states their goals for this hospitalization and ongoing recovery are:: get assistance in the home CMS Medicare.gov Compare Post Acute Care list provided to:: Other (Comment Required) ([facility] to [facility]  for resource) Choice offered to / list presented to : NA (Call to [facility] for resource)      Expected Discharge Plan and Services In-house Referral: NA Discharge Planning Services: CM Consult, Mobile Meals, Other - See comment ([facility]inating with [facility]per) for resources in the home) Post Acute Care Choice: Home Health Living arrangements for the past 2 months: Single Family Home                 DME Arranged: IV pump/equipment DME Agency: Other - Comment Julianne Rice) Date DME Agency Contacted: 12/14/22 Time DME Agency Contacted: [phone] Representative spoke with at DME Agency: Jeri Modena HH Arranged: RN, IV Antibiotics HH Agency: [facility] Date [facility] Agency Contacted: 12/14/22 Time HH Agency Contacted: [phone] Representative spoke with at [facility] Agency: Kandee Keen  Prior Living Arrangements/Services Living arrangements for the past 2 months: Single Family Home Lives with:: Spouse Patient language and need for interpreter reviewed:: Yes Do you feel safe going back to the place where you live?: Yes      Need for Family Participation in Patient Care: Yes (Comment) Care giver support system in place?: Yes (comment) Current home services: DME Criminal Activity/Legal Involvement Pertinent to Current Situation/Hospitalization: No - Comment as needed  Activities of Daily Living Home Assistive Devices/Equipment: Dentures (specify type), Hearing aid, Walker (specify type) ADL Screening (condition at time of admission) Patient's cognitive ability adequate to safely complete daily activities?: Yes Is the patient deaf or have difficulty hearing?: Yes Does the patient have difficulty seeing, even when wearing glasses/contacts?: No Does the patient have difficulty concentrating, remembering, or making decisions?: No Patient able to express need for assistance with ADLs?: Yes Does the patient have difficulty dressing or bathing?: No Independently performs ADLs?: Yes (appropriate for developmental age) Does the patient have difficulty walking or climbing stairs?: Yes Weakness of Legs: Both Weakness of Arms/Hands: None  Permission Sought/Granted Permission sought to share information with : Case Manager, Other (comment) ([facility] [place]) Permission granted to share information with : Yes, Verbal Permission Granted     Permission granted to share info w  AGENCY: HH/[facility]  Permission granted to share info w Relationship: CM     Emotional Assessment Appearance:: Appears stated age Attitude/Demeanor/Rapport: Gracious, Engaged Affect (typically observed): Accepting,  Pleasant Orientation: : Oriented to Self, Oriented to Place, Oriented to  Time, Oriented to Situation Alcohol / Substance Use: Not Applicable Psych Involvement: No (comment)  Admission diagnosis:  Bacteremia [R78.81] Patient Active Problem List   Diagnosis Date Noted   Bacteremia 12/11/2022   Fever of undetermined origin 12/10/2022   Fall at home, initial encounter 12/10/2022   Hyponatremia 12/10/2022   Elevated brain natriuretic peptide (BNP) level 12/10/2022   Microcytic anemia 12/10/2022   Essential hypertension 12/10/2022   Acquired hypothyroidism 12/10/2022   SIRS (systemic inflammatory response syndrome) (HCC) 12/10/2022   Fever of unknown origin 12/10/2022   Senile purpura (HCC) 02/08/2022   S/P TAVR (transcatheter aortic valve replacement) 08/08/2019   Edema leg from Venous Stasis    Severe aortic stenosis    Chronic post-traumatic stress disorder (PTSD) 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 06/29/2019   Chronic sinusitis 11/02/2017   Persistent atrial fibrillation (HCC) 07/07/2017   Allergy to intravenous contrast 10/15/2016   COPD mixed type (HCC) 07/08/2016   Bilateral lower extremity edema 02/26/2013   Back pain, chronic, followed at pain clinic 11/25/2011   Incisional hernia 09/21/2011   Umbilical hernia 05/07/2011   Lung nodule 01/22/2009   Constipation 07/31/2008   Diabetes mellitus type 2, controlled, with complications (HCC) 01/16/2008   NEPHROLITHIASIS, HX OF 07/22/2007   Mixed hyperlipidemia 01/21/2007   Other idiopathic peripheral autonomic neuropathy (HCC) 01/21/2007   Hypertensive heart disease with chronic diastolic congestive heart failure (HCC) 01/21/2007   Allergic rhinitis 01/21/2007   GERD 01/21/2007   DIVERTICULOSIS, COLON 01/21/2007   History of stroke 01/21/2007   ESOPHAGEAL STRICTURE 02/02/2006   PCP:  [name], MD Pharmacy:   [facility] - [place], [place] - [address] [place] [address] Phone: [phone]  Fax: [phone]     Social Determinants of Health (SDOH) Social History: SDOH Screenings   Food Insecurity: No Food Insecurity (12/12/2022)  Housing: Low Risk  (12/12/2022)  Transportation Needs: No Transportation Needs (12/12/2022)  Utilities: Not At Risk (12/12/2022)  Tobacco Use: Medium Risk (12/14/2022)   SDOH Interventions:     Readmission Risk Interventions     No data to display

## 2022-12-14 NOTE — Anesthesia Postprocedure Evaluation (Signed)
Anesthesia Post Note  Patient: [name]  Procedure(s) Performed: TRANSESOPHAGEAL ECHOCARDIOGRAM     Anesthesia Type: MAC Anesthetic complications: no   No notable events documented.  Last Vitals:  Vitals:   12/14/22 1530 12/14/22 1641  BP: (!) 99/59 106/70  Pulse: (!) 59 60  Resp: 13 11  Temp: 36.7 C 36.6 C  SpO2: 99% 99%    Last Pain:  Vitals:   12/14/22 1641  TempSrc: Oral  PainSc:                  Lewie Loron

## 2022-12-14 NOTE — Plan of Care (Signed)
  Problem: Education: Goal: Knowledge of General Education information will improve Description: Including pain rating scale, medication(s)/side effects and non-pharmacologic comfort measures Outcome: Progressing   Problem: Health Behavior/Discharge Planning: Goal: Ability to manage health-related needs will improve Outcome: Progressing   Problem: Clinical Measurements: Goal: Diagnostic test results will improve Outcome: Progressing   

## 2022-12-15 ENCOUNTER — Encounter (HOSPITAL_COMMUNITY): Payer: Self-pay | Admitting: Internal Medicine

## 2022-12-15 ENCOUNTER — Other Ambulatory Visit: Payer: Self-pay | Admitting: Physician Assistant

## 2022-12-15 ENCOUNTER — Telehealth (HOSPITAL_BASED_OUTPATIENT_CLINIC_OR_DEPARTMENT_OTHER): Payer: Self-pay | Admitting: *Deleted

## 2022-12-15 DIAGNOSIS — Z952 Presence of prosthetic heart valve: Secondary | ICD-10-CM

## 2022-12-15 DIAGNOSIS — I330 Acute and subacute infective endocarditis: Secondary | ICD-10-CM

## 2022-12-15 DIAGNOSIS — I358 Other nonrheumatic aortic valve disorders: Secondary | ICD-10-CM | POA: Diagnosis not present

## 2022-12-15 LAB — GLUCOSE, CAPILLARY
Glucose-Capillary: 108 mg/dL — ABNORMAL HIGH (ref 70–99)
Glucose-Capillary: 116 mg/dL — ABNORMAL HIGH (ref 70–99)
Glucose-Capillary: 102 mg/dL — ABNORMAL HIGH (ref 70–99)
Glucose-Capillary: 101 mg/dL — ABNORMAL HIGH (ref 70–99)
Glucose-Capillary: 138 mg/dL — ABNORMAL HIGH (ref 70–99)

## 2022-12-15 LAB — CBC
HCT: 30.6 % — ABNORMAL LOW (ref 39.0–52.0)
Hemoglobin: 9.4 g/dL — ABNORMAL LOW (ref 13.0–17.0)
MCH: 23.4 pg — ABNORMAL LOW (ref 26.0–34.0)
MCHC: 30.7 g/dL (ref 30.0–36.0)
MCV: 76.3 fL — ABNORMAL LOW (ref 80.0–100.0)
Platelets: 365 10*3/uL (ref 150–400)
RBC: 4.01 MIL/uL — ABNORMAL LOW (ref 4.22–5.81)
RDW: 15.9 % — ABNORMAL HIGH (ref 11.5–15.5)
WBC: 11.8 10*3/uL — ABNORMAL HIGH (ref 4.0–10.5)
nRBC: 0 % (ref 0.0–0.2)

## 2022-12-15 LAB — BASIC METABOLIC PANEL
Anion gap: 6 (ref 5–15)
BUN: 15 mg/dL (ref 8–23)
CO2: 25 mmol/L (ref 22–32)
Calcium: 8.6 mg/dL — ABNORMAL LOW (ref 8.9–10.3)
Chloride: 100 mmol/L (ref 98–111)
Creatinine, Ser: 0.94 mg/dL (ref 0.61–1.24)
GFR, Estimated: >60 mL/min (ref >60)
Glucose, Bld: 98 mg/dL (ref 70–99)
Potassium: 4.4 mmol/L (ref 3.5–5.1)
Sodium: 131 mmol/L — ABNORMAL LOW (ref 135–145)

## 2022-12-15 MED ORDER — POLYETHYLENE GLYCOL 3350 17 G PO PACK
17.0000 g | PACK | Freq: Every day | ORAL | Status: AC
  Administered 2022-12-15 – 2022-12-17 (×3): 17 g via ORAL
  Filled 2022-12-15 (×3): qty 1

## 2022-12-15 MED ORDER — METOCLOPRAMIDE HCL 5 MG/ML IJ SOLN
5.0000 mg | Freq: Once | INTRAMUSCULAR | Status: AC
  Administered 2022-12-15: 5 mg via INTRAVENOUS
  Filled 2022-12-15: qty 2

## 2022-12-15 MED ORDER — SENNOSIDES-DOCUSATE SODIUM 8.6-50 MG PO TABS
2.0000 | ORAL_TABLET | Freq: Two times a day (BID) | ORAL | Status: DC
  Administered 2022-12-15 – 2022-12-24 (×14): 2 via ORAL
  Filled 2022-12-15 (×20): qty 2

## 2022-12-15 NOTE — Discharge Instructions (Signed)
Information on my medicine - ELIQUIS (apixaban)  This medication education was reviewed with me or my healthcare representative as part of my discharge preparation.  The pharmacist that spoke with me during my hospital stay was:  Merla Riches, [facility]  Why was Eliquis prescribed for you? Eliquis was prescribed for you to reduce the risk of a blood clot forming that can cause a stroke if you have a medical condition called atrial fibrillation (a type of irregular heartbeat).  What do You need to know about Eliquis ? Take your Eliquis TWICE DAILY - one tablet in the morning and one tablet in the evening with or without food. If you have difficulty swallowing the tablet whole please discuss with your pharmacist how to take the medication safely.  Take Eliquis exactly as prescribed by your doctor and DO NOT stop taking Eliquis without talking to the doctor who prescribed the medication.  Stopping may increase your risk of developing a stroke.  Refill your prescription before you run out.  After discharge, you should have regular check-up appointments with your healthcare provider that is prescribing your Eliquis.  In the future your dose may need to be changed if your kidney function or weight changes by a significant amount or as you get older.  What do you do if you miss a dose? If you miss a dose, take it as soon as you remember on the same day and resume taking twice daily.  Do not take more than one dose of ELIQUIS at the same time to make up a missed dose.  Important Safety Information A possible side effect of Eliquis is bleeding. You should call your healthcare provider right away if you experience any of the following: Bleeding from an injury or your nose that does not stop. Unusual colored urine (red or dark brown) or unusual colored stools (red or black). Unusual bruising for unknown reasons. A serious fall or if you hit your head (even if there is no bleeding).  Some  medicines may interact with Eliquis and might increase your risk of bleeding or clotting while on Eliquis. To help avoid this, consult your healthcare provider or pharmacist prior to using any new prescription or non-prescription medications, including herbals, vitamins, non-steroidal anti-inflammatory drugs (NSAIDs) and supplements.  This website has more information on Eliquis (apixaban): http://www.eliquis.com/eliquis/home

## 2022-12-15 NOTE — Telephone Encounter (Signed)
Post ED Visit - Positive Culture Follow-up  Culture report reviewed by antimicrobial stewardship pharmacist: [facility] [x]  Daylene Posey, Pharm.D. []  Celedonio Miyamoto, Pharm.D., BCPS AQ-ID []  Garvin Fila, Pharm.D., BCPS []  Georgina Pillion, Pharm.D., BCPS []  [place], [address].D., BCPS, AAHIVP []  Estella Husk, Pharm.D., BCPS, AAHIVP []  Lysle Pearl, PharmD, BCPS []  Phillips Climes, PharmD, BCPS []  Agapito Games, PharmD, BCPS []  Verlan Friends, PharmD []  Mervyn Gay, PharmD, BCPS []  Vinnie Level, PharmD  [facility] []  Len Childs, PharmD []  Greer Pickerel, PharmD []  Adalberto Cole, PharmD []  Perlie Gold, Rph []  Lonell Face) Jean Rosenthal, PharmD []  Earl Many, PharmD []  Junita Push, PharmD []  Dorna Leitz, PharmD []  Terrilee Files, PharmD []  Lynann Beaver, PharmD []  Keturah Barre, PharmD []  Loralee Pacas, PharmD []  Bernadene Person, PharmD   Positive blood culture Currently admitted and being followed by I&D and no further patient follow-up is required at this time.  Virl Axe [facility] 12/15/2022, 9:02 AM

## 2022-12-15 NOTE — Progress Notes (Signed)
PROGRESS NOTE    [name]  MVH:[id number] DOB: [birth year] DOA: 12/11/2022 PCP: [name], MD    Chief Complaint  Patient presents with   Abnormal labs    Brief Narrative:    [name] is a 72 y.o. male with medical history significant of history of TAVR, COPD, mild dementia, GERD, history of CVA, diabetes, hypertension, atrial fibrillation on anticoagulation.  Patient with recent hospitalization secondary to fall, he was called back to ED as his blood cultures growing Streptococcus, workup significant for TAVR endocarditis.    Assessment & Plan:  Principal Problem:   Aortic valve endocarditis Active Problems:   Diabetes mellitus type 2, controlled, with complications (HCC)   Hypertensive heart disease with chronic diastolic congestive heart failure (HCC)   GERD   COPD mixed type (HCC)   S/P TAVR (transcatheter aortic valve replacement)   Essential hypertension   Acquired hypothyroidism   Streptococcus bacteremia, infected endocarditis of the TAVR valve -Follow on repeat blood cultures, surveillance cultures as admission remains negative -2D echo is significant of mobile mass and mitral valve appears to be attached to aortic prosthesis and some few, the patient went for TEE on 7/15, which was significant for Infective endocarditis of the TAVR valve was noted with a small mobile vegetation in the LVOT - there is at least mild prosthetic valve stenosis and no AI.  -Cardiology consult greatly appreciated, plan for outpatient follow-up and repeat echo after antibiotics is finished -Antibiotics management per ID, continue with IV Rocephin and gentamicin, will need close hospital monitoring as on high risk antibiotic gentamicin till early next week per ID recommendations.  Falls -Continue with PT/OT  Hypertension - Continue blood pressure medications  History of TAVR  COPD - Continue long-acting inhalers  Essential hypertension - Continue Coreg, losartan    Mixed hyperlipidemia - Continue Crestor   Persistent atrial fibrillation - Continue Coreg, Eliquis   Acquired hypothyroidism - Continue Synthroid  Hyponatremia - improving     DVT prophylaxis: Eliquis Code Status: Full Family Communication: None at bedside Disposition:   Status is: Inpatient  Consultants:  ID Cardiology   Subjective:  Significant events overnight, he reports last BM 3 days ago skin for some stool softener  Objective: Vitals:   12/14/22 2025 12/14/22 2353 12/15/22 0419 12/15/22 0837  BP: 122/75 121/68 139/77 126/71  Pulse: 60 (!) 57 61 76  Resp: 17 19 17 14   Temp: 97.7 F (36.5 C) 97.6 F (36.4 C) 97.6 F (36.4 C) 98.6 F (37 C)  TempSrc: Oral Oral Oral Oral  SpO2: 98% 96% 100% 97%  Weight:        Intake/Output Summary (Last 24 hours) at 12/15/2022 1029 Last data filed at 12/15/2022 0906 Gross per 24 hour  Intake 1656 ml  Output 1050 ml  Net 606 ml   Filed Weights   12/11/22 2100  Weight: 81.6 kg    Examination:  Awake Alert, Oriented X 3, frail, deconditioned Symmetrical Chest wall movement, Good air movement bilaterally, CTAB RRR,No Gallops,Rubs or new Murmurs, No Parasternal Heave +ve B.Sounds, Abd Soft, No tenderness, No rebound - guarding or rigidity. No Cyanosis, Clubbing or edema, No new Rash or bruise          Data Reviewed: I have personally reviewed following labs and imaging studies  CBC: Recent Labs  Lab 12/09/22 2056 12/10/22 0623 12/11/22 1649 12/12/22 0535 12/13/22 0302 12/14/22 0549 12/15/22 0153  WBC 15.4*   < > 9.6 7.1 9.4 11.0* 11.8*  NEUTROABS 13.1*  --  6.7  --   --   --   --   HGB 10.7*   < > 10.4* 10.1* 9.6* 9.9* 9.4*  HCT 33.2*   < > 32.7* 32.2* 30.2* 30.9* 30.6*  MCV 74.8*   < > 76.2* 75.6* 76.6* 73.9* 76.3*  PLT 318   < > 338 315 341 372 365   < > = values in this interval not displayed.    Basic Metabolic Panel: Recent Labs  Lab 12/10/22 0623 12/11/22 1649 12/12/22 0535  12/13/22 0302 12/14/22 0549 12/15/22 0153  NA 126* 129* 131* 128* 133* 131*  K 3.5 4.0 4.0 3.9 4.3 4.4  CL 97* 96* 99 99 98 100  CO2 23 25 23 22 23 25   GLUCOSE 110* 101* 89 100* 86 98  BUN 13 9 5* 11 13 15   CREATININE 0.73 0.78 0.68 0.75 0.85 0.94  CALCIUM 8.3* 8.8* 8.9 8.5* 9.1 8.6*  MG 1.9  --   --   --   --   --   PHOS 4.1  --   --  3.3  --   --     GFR: Estimated Creatinine Clearance: 80.3 mL/min (by C-G formula based on SCr of 0.94 mg/dL).  Liver Function Tests: Recent Labs  Lab 12/09/22 2056 12/10/22 0623 12/11/22 1649  AST 18 13* 17  ALT 15 12 14   ALKPHOS 91 75 88  BILITOT 1.0 1.0 0.6  PROT 8.0 7.0 8.2*  ALBUMIN 2.8* 2.3* 2.8*    CBG: Recent Labs  Lab 12/14/22 1251 12/14/22 1529 12/14/22 2130 12/15/22 0841 12/15/22 0853  GLUCAP 85 159* 105* 108* 116*     Recent Results (from the past 240 hour(s))  Culture, blood (Routine x 2)     Status: Abnormal   Collection Time: 12/09/22  8:56 PM   Specimen: BLOOD RIGHT FOREARM  Result Value Ref Range Status   Specimen Description   Final    BLOOD RIGHT FOREARM Performed at [facility], [address] [place], [place] [address]    Special Requests   Final    BOTTLES DRAWN AEROBIC AND ANAEROBIC Blood Culture results may not be optimal due to an excessive volume of blood received in culture bottles Performed at [facility], [address] [place], [place] [address]    Culture  Setup Time   Final    GRAM POSITIVE COCCI IN BOTH AEROBIC AND ANAEROBIC BOTTLES Gram Stain Report Called to,Read Back By and Verified With: [facility] @ 1517 ON 12/10/2022 BY FRATTO,ASHLEY CRITICAL RESULT CALLED TO, READ BACK BY AND VERIFIED WITH: RN Marisa Hua [phone] @2242  FH Performed at [facility], [address], [place], [place] [address]    Culture STREPTOCOCCUS INFANTARIUS (A)  Final   Report Status 12/14/2022 FINAL  Final   Organism ID, Bacteria STREPTOCOCCUS INFANTARIUS  Final      Susceptibility   Streptococcus  infantarius - MIC*    ERYTHROMYCIN <=0.12 SENSITIVE Sensitive     TETRACYCLINE <=0.25 SENSITIVE Sensitive     VANCOMYCIN 0.5 SENSITIVE Sensitive     CLINDAMYCIN <=0.25 SENSITIVE Sensitive     PENICILLIN 0.25 INTERMEDIATE Intermediate     CEFTRIAXONE 0.25 SENSITIVE Sensitive     LEVOFLOXACIN 2 SENSITIVE Sensitive     * STREPTOCOCCUS INFANTARIUS  Blood Culture ID Panel (Reflexed)     Status: Abnormal   Collection Time: 12/09/22  8:56 PM  Result Value Ref Range Status   Enterococcus faecalis NOT DETECTED NOT DETECTED  Final   Enterococcus Faecium NOT DETECTED NOT DETECTED Final   Listeria monocytogenes NOT DETECTED NOT DETECTED Final   Staphylococcus species NOT DETECTED NOT DETECTED Final   Staphylococcus aureus (BCID) NOT DETECTED NOT DETECTED Final   Staphylococcus epidermidis NOT DETECTED NOT DETECTED Final   Staphylococcus lugdunensis NOT DETECTED NOT DETECTED Final   Streptococcus species DETECTED (A) NOT DETECTED Final    Comment: Not Enterococcus species, Streptococcus agalactiae, Streptococcus pyogenes, or Streptococcus pneumoniae. CRITICAL RESULT CALLED TO, READ BACK BY AND VERIFIED WITH: RN C. TURNER [phone] @2242  FH    Streptococcus agalactiae NOT DETECTED NOT DETECTED Final   Streptococcus pneumoniae NOT DETECTED NOT DETECTED Final   Streptococcus pyogenes NOT DETECTED NOT DETECTED Final   A.calcoaceticus-baumannii NOT DETECTED NOT DETECTED Final   Bacteroides fragilis NOT DETECTED NOT DETECTED Final   Enterobacterales NOT DETECTED NOT DETECTED Final   Enterobacter cloacae complex NOT DETECTED NOT DETECTED Final   Escherichia coli NOT DETECTED NOT DETECTED Final   Klebsiella aerogenes NOT DETECTED NOT DETECTED Final   Klebsiella oxytoca NOT DETECTED NOT DETECTED Final   Klebsiella pneumoniae NOT DETECTED NOT DETECTED Final   Proteus species NOT DETECTED NOT DETECTED Final   Salmonella species NOT DETECTED NOT DETECTED Final   Serratia marcescens NOT DETECTED NOT DETECTED  Final   Haemophilus influenzae NOT DETECTED NOT DETECTED Final   Neisseria meningitidis NOT DETECTED NOT DETECTED Final   Pseudomonas aeruginosa NOT DETECTED NOT DETECTED Final   Stenotrophomonas maltophilia NOT DETECTED NOT DETECTED Final   Candida albicans NOT DETECTED NOT DETECTED Final   Candida auris NOT DETECTED NOT DETECTED Final   Candida glabrata NOT DETECTED NOT DETECTED Final   Candida krusei NOT DETECTED NOT DETECTED Final   Candida parapsilosis NOT DETECTED NOT DETECTED Final   Candida tropicalis NOT DETECTED NOT DETECTED Final   Cryptococcus neoformans/gattii NOT DETECTED NOT DETECTED Final    Comment: Performed at [facility], [address], [place], [place] [address]  Culture, blood (Routine x 2)     Status: Abnormal   Collection Time: 12/09/22  9:25 PM   Specimen: BLOOD LEFT FOREARM  Result Value Ref Range Status   Specimen Description   Final    BLOOD LEFT FOREARM Performed at [facility], [address], [place], [place] [address]    Special Requests   Final    BOTTLES DRAWN AEROBIC AND ANAEROBIC Blood Culture adequate volume Performed at [facility], [address], [place], [place] [address]    Culture  Setup Time   Final    GRAM POSITIVE COCCI IN BOTH AEROBIC AND ANAEROBIC BOTTLES Gram Stain Report Called to,Read Back By and Verified With: TURNER, CHARLOTTE @ 1618 ON 12/10/2022 BY Yehuda Savannah Performed at [facility], [address] [place], [place] [address]    Culture (A)  Final    STREPTOCOCCUS INFANTARIUS SUSCEPTIBILITIES PERFORMED ON PREVIOUS CULTURE WITHIN THE LAST 5 DAYS. Performed at [facility], [address] [place], [place] [address]    Report Status 12/12/2022 FINAL  Final  Resp panel by RT-PCR (RSV, Flu A&B, Covid) Anterior Nasal Swab     Status: None   Collection Time: 12/09/22 10:43 PM   Specimen: Anterior Nasal Swab  Result Value Ref Range Status   SARS Coronavirus 2 by RT PCR NEGATIVE NEGATIVE Final     Comment: (NOTE) SARS-CoV-2 target nucleic acids are NOT DETECTED.  The SARS-CoV-2 RNA is generally detectable in upper respiratory specimens during the acute phase of  infection. The lowest concentration of SARS-CoV-2 viral copies this assay can detect is 138 copies/mL. A negative result does not preclude SARS-Cov-2 infection and should not be used as the sole basis for treatment or other patient management decisions. A negative result may occur with  improper specimen collection/handling, submission of specimen other than nasopharyngeal swab, presence of viral mutation(s) within the areas targeted by this assay, and inadequate number of viral copies(<138 copies/mL). A negative result must be combined with clinical observations, patient history, and epidemiological information. The expected result is Negative.  Fact Sheet for Patients:  BloggerCourse.com  Fact Sheet for Healthcare Providers:  SeriousBroker.it  This test is no t yet approved or cleared by the [place] FDA and  has been authorized for detection and/or diagnosis of SARS-CoV-2 by FDA under an Emergency Use Authorization (EUA). This EUA will remain  in effect (meaning this test can be used) for the duration of the COVID-19 declaration under Section 564(b)(1) of the Act, 21 U.S.C.section 360bbb-3(b)(1), unless the authorization is terminated  or revoked sooner.       Influenza A by PCR NEGATIVE NEGATIVE Final   Influenza B by PCR NEGATIVE NEGATIVE Final    Comment: (NOTE) The Xpert Xpress SARS-CoV-2/FLU/RSV plus assay is intended as an aid in the diagnosis of influenza from Nasopharyngeal swab specimens and should not be used as a sole basis for treatment. Nasal washings and aspirates are unacceptable for Xpert Xpress SARS-CoV-2/FLU/RSV testing.  Fact Sheet for Patients: BloggerCourse.com  Fact Sheet for Healthcare  Providers: SeriousBroker.it  This test is not yet approved or cleared by the [place] FDA and has been authorized for detection and/or diagnosis of SARS-CoV-2 by FDA under an Emergency Use Authorization (EUA). This EUA will remain in effect (meaning this test can be used) for the duration of the COVID-19 declaration under Section 564(b)(1) of the Act, 21 U.S.C. section 360bbb-3(b)(1), unless the authorization is terminated or revoked.     Resp Syncytial Virus by PCR NEGATIVE NEGATIVE Final    Comment: (NOTE) Fact Sheet for Patients: BloggerCourse.com  Fact Sheet for Healthcare Providers: SeriousBroker.it  This test is not yet approved or cleared by the [place] FDA and has been authorized for detection and/or diagnosis of SARS-CoV-2 by FDA under an Emergency Use Authorization (EUA). This EUA will remain in effect (meaning this test can be used) for the duration of the COVID-19 declaration under Section 564(b)(1) of the Act, 21 U.S.C. section 360bbb-3(b)(1), unless the authorization is terminated or revoked.  Performed at [facility], [address] [place], [place] [address]   Culture, blood (routine x 2)     Status: None (Preliminary result)   Collection Time: 12/11/22  4:49 PM   Specimen: Left Antecubital; Blood  Result Value Ref Range Status   Specimen Description LEFT ANTECUBITAL  Final   Special Requests   Final    BOTTLES DRAWN AEROBIC AND ANAEROBIC Blood Culture adequate volume   Culture   Final    NO GROWTH 4 DAYS Performed at [facility], [address], [place], [place] [address]    Report Status PENDING  Incomplete  Culture, blood (routine x 2)     Status: None (Preliminary result)   Collection Time: 12/11/22  4:49 PM   Specimen: Right Antecubital; Blood  Result Value Ref Range Status   Specimen Description RIGHT ANTECUBITAL  Final   Special Requests   Final    BOTTLES DRAWN  AEROBIC AND ANAEROBIC Blood Culture adequate volume   Culture  Final    NO GROWTH 4 DAYS Performed at [facility], [address], [place], [place] [address]    Report Status PENDING  Incomplete         Radiology Studies: ECHO TEE  Result Date: 12/14/2022    TRANSESOPHOGEAL ECHO REPORT   Patient Name:   [name] Date of Exam: 12/14/2022 Medical Rec #:  [id number]       Height:       73.0 in Accession #:    [id number]      Weight:       179.9 lb Date of Birth:  02-Dec-1950       BSA:          2.057 m Patient Age:    72 years        BP:           159/93 mmHg Patient Gender: M               HR:           59 bpm. Exam Location:  Inpatient Procedure: Transesophageal Echo, Color Doppler and Cardiac Doppler Indications:     Bacteremia  History:         Patient has prior history of Echocardiogram examinations, most                  recent 12/12/2022. COPD; Risk Factors:Hypertension, Diabetes and                  Dyslipidemia. 29mm Edwards Sapien TAVR Implanted 08/08/19.                   Aortic Valve: 29 mm Sapien prosthetic, stented (TAVR) valve is                  present in the aortic position. Procedure Date: 2021.  Sonographer:     Harriette Bouillon RDCS Referring Phys:  [id number] Corrin Parker Diagnosing Phys: Zoila Shutter MD PROCEDURE: After discussion of the risks and benefits of a TEE, an informed consent was obtained from the patient. The transesophogeal probe was passed without difficulty through the esophogus of the patient. Sedation performed by different physician. The patient was monitored while under deep sedation. Anesthestetic sedation was provided intravenously by Anesthesiology: 195mg  of Propofol, 100mg  of Lidocaine. Supplementary images were obtained from transthoracic windows as indicated to answer the clinical question. The patient developed no complications during the procedure.  IMPRESSIONS  1. Left ventricular ejection fraction, by estimation, is 55 to 60%. The left ventricle has  normal function. There is mild left ventricular hypertrophy.  2. Right ventricular systolic function is normal. The right ventricular size is normal.  3. No left atrial/left atrial appendage thrombus was detected.  4. The mitral valve is degenerative. Mild mitral valve regurgitation.  5. Mobile filmentous mass in the LVOT adherant to the valve, likely vegetation. The aortic valve has been repaired/replaced. There is moderate thickening of the aortic valve. Aortic valve regurgitation is not visualized. Mild to moderate aortic valve stenosis. There is a 29 mm Sapien prosthetic (TAVR) valve present in the aortic position. Procedure Date: 2021. 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 Aortic valve area, by VTI measures 1.42 cm. Aortic valve mean gradient  measures 15.5 mmHg. Aortic valve Vmax measures 2.50 m/s.  6. There is mild (Grade II) layered plaque involving the ascending aorta and aortic arch. 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 Consider structural heart  team evaluation. Suspect treatment will be prolonged course of antibiotic therapy and repeat imaging of the valve after. FINDINGS  Left Ventricle: Left ventricular ejection fraction, by estimation, is 55 to 60%. The left ventricle has normal function. The left ventricular internal cavity size was normal in size. There is mild left ventricular hypertrophy. Right Ventricle: The right ventricular size is normal. No increase in right ventricular wall thickness. Right ventricular systolic function is normal. Left Atrium: Left atrial size was normal in size. No left atrial/left atrial appendage thrombus was detected. Right Atrium: Right atrial size was normal in size. Pericardium: There is no evidence of pericardial effusion. Mitral Valve: The mitral valve is degenerative in appearance. There is moderate thickening of the mitral valve leaflet(s). There is  moderate calcification of the posterior mitral valve leaflet(s). Mild to moderate mitral annular calcification. Mild mitral valve regurgitation. Tricuspid Valve: The tricuspid valve is grossly normal. Tricuspid valve regurgitation is trivial. Aortic Valve: Mobile filmentous mass in the LVOT adherant to the valve, likely vegetation. The aortic valve has been repaired/replaced. There is moderate thickening of the aortic valve. Aortic valve regurgitation is not visualized. Mild to moderate aortic stenosis is present. Aortic valve mean gradient measures 15.5 mmHg. Aortic valve peak gradient measures 25.1 mmHg. Aortic valve area, by VTI measures 1.42 cm. There is a 29 mm Sapien prosthetic, stented (TAVR) valve present in the aortic position. Procedure Date: 2021. Pulmonic Valve: The pulmonic valve was not well visualized. Pulmonic valve regurgitation is not visualized. Aorta: The aortic root and ascending aorta are structurally normal, with no evidence of dilitation. There is mild (Grade II) layered plaque involving the ascending aotra and aortic arch. Venous: The left lower pulmonary vein and left upper pulmonary vein are normal. IAS/Shunts: No atrial level shunt detected by color flow Doppler. Additional Comments: Spectral Doppler performed. LEFT VENTRICLE PLAX 2D LVOT diam:     2.20 cm LV SV:         72 LV SV Index:   35 LVOT Area:     3.80 cm  AORTIC VALVE AV Area (Vmax):    1.91 cm AV Area (Vmean):   1.77 cm AV Area (VTI):     1.42 cm AV Vmax:           250.50 cm/s AV Vmean:          185.000 cm/s AV VTI:            0.508 m AV Peak Grad:      25.1 mmHg AV Mean Grad:      15.5 mmHg LVOT Vmax:         126.00 cm/s LVOT Vmean:        86.300 cm/s LVOT VTI:          0.190 m LVOT/AV VTI ratio: 0.37  AORTA Ao Asc diam: 4.00 cm  SHUNTS Systemic VTI:  0.19 m Systemic Diam: 2.20 cm Zoila Shutter MD Electronically signed by Zoila Shutter MD Signature Date/Time: 12/14/2022/2:41:36 PM    Final    EP STUDY  Result Date:  12/14/2022 See surgical note for result.       Scheduled Meds:  apixaban  5 mg Oral BID   carvedilol  25 mg Oral BID WC   feeding supplement (GLUCERNA SHAKE)  237 mL Oral TID BM   fluticasone furoate-vilanterol  1 puff Inhalation Daily   insulin aspart  0-5 Units Subcutaneous QHS   insulin aspart  0-9 Units Subcutaneous TID WC   levothyroxine  25 mcg  Oral Daily   losartan  100 mg Oral Daily   rosuvastatin  20 mg Oral Daily   sertraline  150 mg Oral Daily   spironolactone  12.5 mg Oral Daily   Continuous Infusions:  cefTRIAXone (ROCEPHIN)  IV 2 g (12/14/22 1743)   gentamicin 240 mg (12/15/22 0905)     LOS: 4 days     Huey Bienenstock, MD [facility]   To contact the attending provider between 7A-7P or the covering provider during after hours 7P-7A, please log into the web site [email or web address] and access using universal [facility] password for that web site. If you do not have the password, please call the hospital operator.  12/15/2022, 10:29 AM

## 2022-12-15 NOTE — Plan of Care (Signed)
  Problem: Education: Goal: Knowledge of General Education information will improve Description: Including pain rating scale, medication(s)/side effects and non-pharmacologic comfort measures Outcome: Progressing   Problem: Health Behavior/Discharge Planning: Goal: Ability to manage health-related needs will improve Outcome: Progressing   Problem: Pain Managment: Goal: General experience of comfort will improve Outcome: Progressing   

## 2022-12-15 NOTE — TOC Progression Note (Signed)
Transition of Care [facility]) - Progression Note    Patient Details  Name: [name] MRN: [id number] Date of Birth: Sep 28, 1950  [facility] CM/SW Contact  Gordy Clement, RN Phone Number: 12/15/2022, 9:30 AM  Clinical Narrative:     CM called Caseworker at the [facility] and left a voice mail with contact information for a return call. Patient needs additional help in the home with PCS services  Need to see if he's entitled to anything    Caseworker:  Currie Paris  [phone]  [facility]    TOC will continue to follow patient for any additional discharge needs        Expected Discharge Plan: Home w Home Health Services Barriers to Discharge: Continued Medical Work up  Expected Discharge Plan and Services In-house Referral: NA Discharge Planning Services: CM Consult, Mobile Meals, Other - See comment (coordinating with the [place] [facility]) for resources in the home) Post Acute Care Choice: Home Health Living arrangements for the past 2 months: Single Family Home                 DME Arranged: IV pump/equipment DME Agency: Other - Comment Julianne Rice) Date DME Agency Contacted: 12/14/22 Time DME Agency Contacted: [phone] Representative spoke with at DME Agency: Jeri Modena HH Arranged: RN, IV Antibiotics HH Agency: [facility] Date [facility] Agency Contacted: 12/14/22 Time HH Agency Contacted: 1615 Representative spoke with at [facility] Agency: Kandee Keen   Social Determinants of Health (SDOH) Interventions SDOH Screenings   Food Insecurity: No Food Insecurity (12/12/2022)  Housing: Low Risk  (12/12/2022)  Transportation Needs: No Transportation Needs (12/12/2022)  Utilities: Not At Risk (12/12/2022)  Tobacco Use: Medium Risk (12/14/2022)    Readmission Risk Interventions     No data to display

## 2022-12-15 NOTE — Care Management Important Message (Signed)
Important Message  Patient Details  Name: [name] MRN: [id number] Date of Birth: [birth year]   Medicare Important Message Given:  Yes Patient left prior to IM delivery will mail a copy to the patient home address.     [name] 12/15/2022, 1:43 PM

## 2022-12-16 ENCOUNTER — Other Ambulatory Visit: Payer: Self-pay

## 2022-12-16 DIAGNOSIS — I358 Other nonrheumatic aortic valve disorders: Secondary | ICD-10-CM | POA: Diagnosis not present

## 2022-12-16 DIAGNOSIS — E118 Type 2 diabetes mellitus with unspecified complications: Secondary | ICD-10-CM | POA: Diagnosis not present

## 2022-12-16 DIAGNOSIS — I10 Essential (primary) hypertension: Secondary | ICD-10-CM | POA: Diagnosis not present

## 2022-12-16 LAB — CBC
HCT: 33.5 % — ABNORMAL LOW (ref 39.0–52.0)
Hemoglobin: 10.6 g/dL — ABNORMAL LOW (ref 13.0–17.0)
MCH: 23.7 pg — ABNORMAL LOW (ref 26.0–34.0)
MCHC: 31.6 g/dL (ref 30.0–36.0)
MCV: 74.9 fL — ABNORMAL LOW (ref 80.0–100.0)
Platelets: 406 10*3/uL — ABNORMAL HIGH (ref 150–400)
RBC: 4.47 MIL/uL (ref 4.22–5.81)
RDW: 15.9 % — ABNORMAL HIGH (ref 11.5–15.5)
WBC: 14.1 10*3/uL — ABNORMAL HIGH (ref 4.0–10.5)
nRBC: 0 % (ref 0.0–0.2)

## 2022-12-16 LAB — CULTURE, BLOOD (ROUTINE X 2)
Culture: NO GROWTH
Special Requests: ADEQUATE

## 2022-12-16 LAB — BASIC METABOLIC PANEL
Anion gap: 11 (ref 5–15)
BUN: 12 mg/dL (ref 8–23)
CO2: 23 mmol/L (ref 22–32)
Calcium: 9.3 mg/dL (ref 8.9–10.3)
Chloride: 97 mmol/L — ABNORMAL LOW (ref 98–111)
Creatinine, Ser: 0.86 mg/dL (ref 0.61–1.24)
GFR, Estimated: >60 mL/min (ref >60)
Glucose, Bld: 84 mg/dL (ref 70–99)
Potassium: 4 mmol/L (ref 3.5–5.1)
Sodium: 131 mmol/L — ABNORMAL LOW (ref 135–145)

## 2022-12-16 LAB — GLUCOSE, CAPILLARY
Glucose-Capillary: 88 mg/dL (ref 70–99)
Glucose-Capillary: 112 mg/dL — ABNORMAL HIGH (ref 70–99)
Glucose-Capillary: 121 mg/dL — ABNORMAL HIGH (ref 70–99)
Glucose-Capillary: 138 mg/dL — ABNORMAL HIGH (ref 70–99)

## 2022-12-16 NOTE — Plan of Care (Signed)
  Problem: Education: Goal: Knowledge of General Education information will improve Description: Including pain rating scale, medication(s)/side effects and non-pharmacologic comfort measures Outcome: Progressing   Problem: Clinical Measurements: Goal: Ability to maintain clinical measurements within normal limits will improve Outcome: Progressing Goal: Cardiovascular complication will be avoided Outcome: Progressing   

## 2022-12-16 NOTE — TOC Progression Note (Signed)
Transition of Care [facility]) - Progression Note    Patient Details  Name: [name] MRN: [id number] Date of Birth: 04/27/1951  Transition of Care [facility]) CM/SW Contact  Gordy Clement, RN Phone Number: 12/16/2022, 4:09 PM  Clinical Narrative:     Home Health PT added to referral for [facility]- OPPT recommended but patient will need [facility] RN for IV infusion- Cannot have HH and OP therapies simultaneously.    [facility] following for final ID orders. They have submitted all necessary paperwork to [place].   TOC will continue to follow patient for any additional discharge needs       Expected Discharge Plan: Home w Home Health Services Barriers to Discharge: Continued Medical Work up  Expected Discharge Plan and Services In-house Referral: NA Discharge Planning Services: CM Consult, Mobile Meals, Other - See comment (coordinating with the [place] Kathryne Sharper) for resources in the home) Post Acute Care Choice: Home Health Living arrangements for the past 2 months: Single Family Home                 DME Arranged: IV pump/equipment DME Agency: Other - Comment Julianne Rice) Date DME Agency Contacted: 12/14/22 Time DME Agency Contacted: [phone] Representative spoke with at DME Agency: Jeri Modena HH Arranged: RN, IV Antibiotics HH Agency: [facility] Date [facility] Agency Contacted: 12/14/22 Time HH Agency Contacted: 1615 Representative spoke with at [facility] Agency: Kandee Keen   Social Determinants of Health (SDOH) Interventions SDOH Screenings   Food Insecurity: No Food Insecurity (12/12/2022)  Housing: Low Risk  (12/12/2022)  Transportation Needs: No Transportation Needs (12/12/2022)  Utilities: Not At Risk (12/12/2022)  Tobacco Use: Medium Risk (12/14/2022)    Readmission Risk Interventions     No data to display

## 2022-12-16 NOTE — Plan of Care (Signed)
 °  Problem: Education: °Goal: Knowledge of General Education information will improve °Description: Including pain rating scale, medication(s)/side effects and non-pharmacologic comfort measures °Outcome: Progressing °  °Problem: Health Behavior/Discharge Planning: °Goal: Ability to manage health-related needs will improve °Outcome: Progressing °  °Problem: Clinical Measurements: °Goal: Cardiovascular complication will be avoided °Outcome: Progressing °  °

## 2022-12-16 NOTE — Progress Notes (Signed)
PROGRESS NOTE    [name]  WUJ:[id number] DOB: September 22, 1950 DOA: 12/11/2022 PCP: [name], MD    Chief Complaint  Patient presents with   Abnormal labs    Brief Narrative:    [name] is a 72 y.o. male with medical history significant of history of TAVR, COPD, mild dementia, GERD, history of CVA, diabetes, hypertension, atrial fibrillation on anticoagulation.  Patient with recent hospitalization secondary to fall, he was called back to ED as his blood cultures growing Streptococcus, workup significant for TAVR endocarditis.    Assessment & Plan:  Principal Problem:   Aortic valve endocarditis Active Problems:   Diabetes mellitus type 2, controlled, with complications (HCC)   Hypertensive heart disease with chronic diastolic congestive heart failure (HCC)   GERD   COPD mixed type (HCC)   S/P TAVR (transcatheter aortic valve replacement)   Essential hypertension   Acquired hypothyroidism   Streptococcus bacteremia, infected endocarditis of the TAVR valve -Follow on repeat blood cultures, surveillance cultures as admission remains negative -2D echo is significant of mobile mass and mitral valve appears to be attached to aortic prosthesis and some few, the patient went for TEE on 7/15, which was significant for Infective endocarditis of the TAVR valve was noted with a small mobile vegetation in the LVOT - there is at least mild prosthetic valve stenosis and no AI.  -Cardiology consult greatly appreciated, plan for outpatient follow-up and repeat echo after antibiotics is finished -Antibiotics management per ID, continue with IV Rocephin and gentamicin, will need 2 weeks of IV gentamicin, ID would like to keep him until at least next week to ensure he tolerates high risk medication well.   -Surveillance blood cultures are negative, will request PICC line insertion.  Falls -Continue with PT/OT  Hypertension - Continue blood pressure medications  History of  TAVR  COPD - Continue long-acting inhalers  Essential hypertension - Continue Coreg, losartan   Mixed hyperlipidemia - Continue Crestor   Persistent atrial fibrillation - Continue Coreg, Eliquis   Acquired hypothyroidism - Continue Synthroid  Hyponatremia - improving     DVT prophylaxis: Eliquis Code Status: Full Family Communication: None at bedside Disposition:   Status is: Inpatient  Consultants:  ID Cardiology   Subjective:  No significant events overnight, he denies any complaints today.  Objective: Vitals:   12/15/22 2000 12/15/22 2324 12/16/22 0449 12/16/22 0828  BP: 122/71 (!) 154/78 (!) 166/76 (!) 172/91  Pulse: 60 63 65   Resp: 16 18 10    Temp: 98.6 F (37 C) 98.4 F (36.9 C) 98.5 F (36.9 C) 98 F (36.7 C)  TempSrc: Oral Oral Oral Oral  SpO2: 97% 100% 96%   Weight:        Intake/Output Summary (Last 24 hours) at 12/16/2022 1017 Last data filed at 12/16/2022 0518 Gross per 24 hour  Intake --  Output 1250 ml  Net -1250 ml   Filed Weights   12/11/22 2100  Weight: 81.6 kg    Examination:  Awake Alert, Oriented X 3, frail, deconditioned Symmetrical Chest wall movement, Good air movement bilaterally, CTAB RRR,No Gallops,Rubs or new Murmurs, No Parasternal Heave +ve B.Sounds, Abd Soft, No tenderness, No rebound - guarding or rigidity. No Cyanosis, Clubbing or edema, No new Rash or bruise           Data Reviewed: I have personally reviewed following labs and imaging studies  CBC: Recent Labs  Lab 12/09/22 2056 12/10/22 0623 12/11/22 1649 12/12/22 0535 12/13/22 0302 12/14/22  4098 12/15/22 0153 12/16/22 0558  WBC 15.4*   < > 9.6 7.1 9.4 11.0* 11.8* 14.1*  NEUTROABS 13.1*  --  6.7  --   --   --   --   --   HGB 10.7*   < > 10.4* 10.1* 9.6* 9.9* 9.4* 10.6*  HCT 33.2*   < > 32.7* 32.2* 30.2* 30.9* 30.6* 33.5*  MCV 74.8*   < > 76.2* 75.6* 76.6* 73.9* 76.3* 74.9*  PLT 318   < > 338 315 341 372 365 406*   < > = values in this  interval not displayed.    Basic Metabolic Panel: Recent Labs  Lab 12/10/22 0623 12/11/22 1649 12/12/22 0535 12/13/22 0302 12/14/22 0549 12/15/22 0153 12/16/22 0558  NA 126*   < > 131* 128* 133* 131* 131*  K 3.5   < > 4.0 3.9 4.3 4.4 4.0  CL 97*   < > 99 99 98 100 97*  CO2 23   < > 23 22 23 25 23   GLUCOSE 110*   < > 89 100* 86 98 84  BUN 13   < > 5* 11 13 15 12   CREATININE 0.73   < > 0.68 0.75 0.85 0.94 0.86  CALCIUM 8.3*   < > 8.9 8.5* 9.1 8.6* 9.3  MG 1.9  --   --   --   --   --   --   PHOS 4.1  --   --  3.3  --   --   --    < > = values in this interval not displayed.    GFR: Estimated Creatinine Clearance: 87.7 mL/min (by C-G formula based on SCr of 0.86 mg/dL).  Liver Function Tests: Recent Labs  Lab 12/09/22 2056 12/10/22 0623 12/11/22 1649  AST 18 13* 17  ALT 15 12 14   ALKPHOS 91 75 88  BILITOT 1.0 1.0 0.6  PROT 8.0 7.0 8.2*  ALBUMIN 2.8* 2.3* 2.8*    CBG: Recent Labs  Lab 12/15/22 0853 12/15/22 1149 12/15/22 1630 12/15/22 2121 12/16/22 0757  GLUCAP 116* 102* 101* 138* 88     Recent Results (from the past 240 hour(s))  Culture, blood (Routine x 2)     Status: Abnormal   Collection Time: 12/09/22  8:56 PM   Specimen: BLOOD RIGHT FOREARM  Result Value Ref Range Status   Specimen Description   Final    BLOOD RIGHT FOREARM Performed at [facility], [address], [place], [place] [address]    Special Requests   Final    BOTTLES DRAWN AEROBIC AND ANAEROBIC Blood Culture results may not be optimal due to an excessive volume of blood received in culture bottles Performed at [facility], [address], [place], [place] [address]    Culture  Setup Time   Final    GRAM POSITIVE COCCI IN BOTH AEROBIC AND ANAEROBIC BOTTLES Gram Stain Report Called to,Read Back By and Verified With: [facility] @ 1517 ON 12/10/2022 BY FRATTO,ASHLEY CRITICAL RESULT CALLED TO, READ BACK BY AND VERIFIED WITH: RN Marisa Hua [phone] @2242  FH Performed at [facility], [address], [place], [place] [address]    Culture STREPTOCOCCUS INFANTARIUS (A)  Final   Report Status 12/14/2022 FINAL  Final   Organism ID, Bacteria STREPTOCOCCUS INFANTARIUS  Final      Susceptibility   Streptococcus infantarius - MIC*    ERYTHROMYCIN <=0.12 SENSITIVE Sensitive     TETRACYCLINE <=0.25 SENSITIVE Sensitive  VANCOMYCIN 0.5 SENSITIVE Sensitive     CLINDAMYCIN <=0.25 SENSITIVE Sensitive     PENICILLIN 0.25 INTERMEDIATE Intermediate     CEFTRIAXONE 0.25 SENSITIVE Sensitive     LEVOFLOXACIN 2 SENSITIVE Sensitive     * STREPTOCOCCUS INFANTARIUS  Blood Culture ID Panel (Reflexed)     Status: Abnormal   Collection Time: 12/09/22  8:56 PM  Result Value Ref Range Status   Enterococcus faecalis NOT DETECTED NOT DETECTED Final   Enterococcus Faecium NOT DETECTED NOT DETECTED Final   Listeria monocytogenes NOT DETECTED NOT DETECTED Final   Staphylococcus species NOT DETECTED NOT DETECTED Final   Staphylococcus aureus (BCID) NOT DETECTED NOT DETECTED Final   Staphylococcus epidermidis NOT DETECTED NOT DETECTED Final   Staphylococcus lugdunensis NOT DETECTED NOT DETECTED Final   Streptococcus species DETECTED (A) NOT DETECTED Final    Comment: Not Enterococcus species, Streptococcus agalactiae, Streptococcus pyogenes, or Streptococcus pneumoniae. CRITICAL RESULT CALLED TO, READ BACK BY AND VERIFIED WITH: RN C. Mayford Knife [phone] @2242  FH    Streptococcus agalactiae NOT DETECTED NOT DETECTED Final   Streptococcus pneumoniae NOT DETECTED NOT DETECTED Final   Streptococcus pyogenes NOT DETECTED NOT DETECTED Final   A.calcoaceticus-baumannii NOT DETECTED NOT DETECTED Final   Bacteroides fragilis NOT DETECTED NOT DETECTED Final   Enterobacterales NOT DETECTED NOT DETECTED Final   Enterobacter cloacae complex NOT DETECTED NOT DETECTED Final   Escherichia coli NOT DETECTED NOT DETECTED Final   Klebsiella aerogenes NOT DETECTED NOT DETECTED Final   Klebsiella oxytoca NOT  DETECTED NOT DETECTED Final   Klebsiella pneumoniae NOT DETECTED NOT DETECTED Final   Proteus species NOT DETECTED NOT DETECTED Final   Salmonella species NOT DETECTED NOT DETECTED Final   Serratia marcescens NOT DETECTED NOT DETECTED Final   Haemophilus influenzae NOT DETECTED NOT DETECTED Final   Neisseria meningitidis NOT DETECTED NOT DETECTED Final   Pseudomonas aeruginosa NOT DETECTED NOT DETECTED Final   Stenotrophomonas maltophilia NOT DETECTED NOT DETECTED Final   Candida albicans NOT DETECTED NOT DETECTED Final   Candida auris NOT DETECTED NOT DETECTED Final   Candida glabrata NOT DETECTED NOT DETECTED Final   Candida krusei NOT DETECTED NOT DETECTED Final   Candida parapsilosis NOT DETECTED NOT DETECTED Final   Candida tropicalis NOT DETECTED NOT DETECTED Final   Cryptococcus neoformans/gattii NOT DETECTED NOT DETECTED Final    Comment: Performed at [facility], [address] [place], [place] [address]  Culture, blood (Routine x 2)     Status: Abnormal   Collection Time: 12/09/22  9:25 PM   Specimen: BLOOD LEFT FOREARM  Result Value Ref Range Status   Specimen Description   Final    BLOOD LEFT FOREARM Performed at [facility], [address], [place], [place] [address]    Special Requests   Final    BOTTLES DRAWN AEROBIC AND ANAEROBIC Blood Culture adequate volume Performed at [facility], [address], [place], [place] [address]    Culture  Setup Time   Final    GRAM POSITIVE COCCI IN BOTH AEROBIC AND ANAEROBIC BOTTLES Gram Stain Report Called to,Read Back By and Verified With: TURNER, CHARLOTTE @ 1618 ON 12/10/2022 BY Yehuda Savannah Performed at [facility], [address] [place], [place] [address]    Culture (A)  Final    STREPTOCOCCUS INFANTARIUS SUSCEPTIBILITIES PERFORMED ON PREVIOUS CULTURE WITHIN THE LAST 5 DAYS. Performed at [facility], [address] [place], [place] [address]    Report Status 12/12/2022 FINAL  Final  Resp panel  by RT-PCR (RSV, Flu A&B, Covid) Anterior Nasal Swab     Status: None   Collection Time: 12/09/22 10:43 PM   Specimen: Anterior Nasal Swab  Result Value Ref Range Status   SARS Coronavirus 2 by RT PCR NEGATIVE NEGATIVE Final    Comment: (NOTE) SARS-CoV-2 target nucleic acids are NOT DETECTED.  The SARS-CoV-2 RNA is generally detectable in upper respiratory specimens during the acute phase of infection. The lowest concentration of SARS-CoV-2 viral copies this assay can detect is 138 copies/mL. A negative result does not preclude SARS-Cov-2 infection and should not be used as the sole basis for treatment or other patient management decisions. A negative result may occur with  improper specimen collection/handling, submission of specimen other than nasopharyngeal swab, presence of viral mutation(s) within the areas targeted by this assay, and inadequate number of viral copies(<138 copies/mL). A negative result must be combined with clinical observations, patient history, and epidemiological information. The expected result is Negative.  Fact Sheet for Patients:  BloggerCourse.com  Fact Sheet for Healthcare Providers:  SeriousBroker.it  This test is no t yet approved or cleared by the [place] FDA and  has been authorized for detection and/or diagnosis of SARS-CoV-2 by FDA under an Emergency Use Authorization (EUA). This EUA will remain  in effect (meaning this test can be used) for the duration of the COVID-19 declaration under Section 564(b)(1) of the Act, 21 U.S.C.section 360bbb-3(b)(1), unless the authorization is terminated  or revoked sooner.       Influenza A by PCR NEGATIVE NEGATIVE Final   Influenza B by PCR NEGATIVE NEGATIVE Final    Comment: (NOTE) The Xpert Xpress SARS-CoV-2/FLU/RSV plus assay is intended as an aid in the diagnosis of influenza from Nasopharyngeal swab specimens and should not be used as a sole  basis for treatment. Nasal washings and aspirates are unacceptable for Xpert Xpress SARS-CoV-2/FLU/RSV testing.  Fact Sheet for Patients: BloggerCourse.com  Fact Sheet for Healthcare Providers: SeriousBroker.it  This test is not yet approved or cleared by the [place] FDA and has been authorized for detection and/or diagnosis of SARS-CoV-2 by FDA under an Emergency Use Authorization (EUA). This EUA will remain in effect (meaning this test can be used) for the duration of the COVID-19 declaration under Section 564(b)(1) of the Act, 21 U.S.C. section 360bbb-3(b)(1), unless the authorization is terminated or revoked.     Resp Syncytial Virus by PCR NEGATIVE NEGATIVE Final    Comment: (NOTE) Fact Sheet for Patients: BloggerCourse.com  Fact Sheet for Healthcare Providers: SeriousBroker.it  This test is not yet approved or cleared by the [place] FDA and has been authorized for detection and/or diagnosis of SARS-CoV-2 by FDA under an Emergency Use Authorization (EUA). This EUA will remain in effect (meaning this test can be used) for the duration of the COVID-19 declaration under Section 564(b)(1) of the Act, 21 U.S.C. section 360bbb-3(b)(1), unless the authorization is terminated or revoked.  Performed at [facility], [address], [place], [place] [address]   Culture, blood (routine x 2)     Status: None   Collection Time: 12/11/22  4:49 PM   Specimen: Left Antecubital; Blood  Result Value Ref Range Status   Specimen Description LEFT ANTECUBITAL  Final   Special Requests   Final    BOTTLES DRAWN AEROBIC AND ANAEROBIC Blood Culture adequate volume   Culture   Final    NO GROWTH 5 DAYS Performed at [facility], [address] [place], [place] [address]  Report Status 12/16/2022 FINAL  Final  Culture, blood (routine x 2)     Status: None   Collection Time:  12/11/22  4:49 PM   Specimen: Right Antecubital; Blood  Result Value Ref Range Status   Specimen Description RIGHT ANTECUBITAL  Final   Special Requests   Final    BOTTLES DRAWN AEROBIC AND ANAEROBIC Blood Culture adequate volume   Culture   Final    NO GROWTH 5 DAYS Performed at [facility], [address] [place], [place] [address]    Report Status 12/16/2022 FINAL  Final         Radiology Studies: [place] EKG SITE RITE  Result Date: 12/16/2022 If Site Rite image not attached, placement could not be confirmed due to current cardiac rhythm.  ECHO TEE  Result Date: 12/14/2022    TRANSESOPHOGEAL ECHO REPORT   Patient Name:   CARLESTER KASPAREK Date of Exam: 12/14/2022 Medical Rec #:  [id number]       Height:       73.0 in Accession #:    [id number]      Weight:       179.9 lb Date of Birth:  June 19, 1950       BSA:          2.057 m Patient Age:    72 years        BP:           159/93 mmHg Patient Gender: M               HR:           59 bpm. Exam Location:  Inpatient Procedure: Transesophageal Echo, Color Doppler and Cardiac Doppler Indications:     Bacteremia  History:         Patient has prior history of Echocardiogram examinations, most                  recent 12/12/2022. COPD; Risk Factors:Hypertension, Diabetes and                  Dyslipidemia. 29mm Edwards Sapien TAVR Implanted 08/08/19.                   Aortic Valve: 29 mm Sapien prosthetic, stented (TAVR) valve is                  present in the aortic position. Procedure Date: 2021.  Sonographer:     Harriette Bouillon RDCS Referring Phys:  [id number] Corrin Parker Diagnosing Phys: Zoila Shutter MD PROCEDURE: After discussion of the risks and benefits of a TEE, an informed consent was obtained from the patient. The transesophogeal probe was passed without difficulty through the esophogus of the patient. Sedation performed by different physician. The patient was monitored while under deep sedation. Anesthestetic sedation was provided intravenously by  Anesthesiology: 195mg  of Propofol, 100mg  of Lidocaine. Supplementary images were obtained from transthoracic windows as indicated to answer the clinical question. The patient developed no complications during the procedure.  IMPRESSIONS  1. Left ventricular ejection fraction, by estimation, is 55 to 60%. The left ventricle has normal function. There is mild left ventricular hypertrophy.  2. Right ventricular systolic function is normal. The right ventricular size is normal.  3. No left atrial/left atrial appendage thrombus was detected.  4. The mitral valve is degenerative. Mild mitral valve regurgitation.  5. Mobile filmentous mass in the LVOT adherant to the valve, likely vegetation. The aortic valve has been repaired/replaced. There  is moderate thickening of the aortic valve. Aortic valve regurgitation is not visualized. Mild to moderate aortic valve stenosis. There is a 29 mm Sapien prosthetic (TAVR) valve present in the aortic position. Procedure Date: 2021. 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 Aortic valve area, by VTI measures 1.42 cm. Aortic valve mean gradient  measures 15.5 mmHg. Aortic valve Vmax measures 2.50 m/s.  6. There is mild (Grade II) layered plaque involving the ascending aorta and aortic arch. 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 Consider structural heart team evaluation. Suspect treatment will be prolonged course of antibiotic therapy and repeat imaging of the valve after. FINDINGS  Left Ventricle: Left ventricular ejection fraction, by estimation, is 55 to 60%. The left ventricle has normal function. The left ventricular internal cavity size was normal in size. There is mild left ventricular hypertrophy. Right Ventricle: The right ventricular size is normal. No increase in right ventricular wall thickness. Right ventricular systolic function is normal. Left Atrium: Left atrial size  was normal in size. No left atrial/left atrial appendage thrombus was detected. Right Atrium: Right atrial size was normal in size. Pericardium: There is no evidence of pericardial effusion. Mitral Valve: The mitral valve is degenerative in appearance. There is moderate thickening of the mitral valve leaflet(s). There is moderate calcification of the posterior mitral valve leaflet(s). Mild to moderate mitral annular calcification. Mild mitral valve regurgitation. Tricuspid Valve: The tricuspid valve is grossly normal. Tricuspid valve regurgitation is trivial. Aortic Valve: Mobile filmentous mass in the LVOT adherant to the valve, likely vegetation. The aortic valve has been repaired/replaced. There is moderate thickening of the aortic valve. Aortic valve regurgitation is not visualized. Mild to moderate aortic stenosis is present. Aortic valve mean gradient measures 15.5 mmHg. Aortic valve peak gradient measures 25.1 mmHg. Aortic valve area, by VTI measures 1.42 cm. There is a 29 mm Sapien prosthetic, stented (TAVR) valve present in the aortic position. Procedure Date: 2021. Pulmonic Valve: The pulmonic valve was not well visualized. Pulmonic valve regurgitation is not visualized. Aorta: The aortic root and ascending aorta are structurally normal, with no evidence of dilitation. There is mild (Grade II) layered plaque involving the ascending aotra and aortic arch. Venous: The left lower pulmonary vein and left upper pulmonary vein are normal. IAS/Shunts: No atrial level shunt detected by color flow Doppler. Additional Comments: Spectral Doppler performed. LEFT VENTRICLE PLAX 2D LVOT diam:     2.20 cm LV SV:         72 LV SV Index:   35 LVOT Area:     3.80 cm  AORTIC VALVE AV Area (Vmax):    1.91 cm AV Area (Vmean):   1.77 cm AV Area (VTI):     1.42 cm AV Vmax:           250.50 cm/s AV Vmean:          185.000 cm/s AV VTI:            0.508 m AV Peak Grad:      25.1 mmHg AV Mean Grad:      15.5 mmHg LVOT Vmax:          126.00 cm/s LVOT Vmean:        86.300 cm/s LVOT VTI:          0.190 m LVOT/AV VTI ratio: 0.37  AORTA Ao Asc diam: 4.00 cm  SHUNTS Systemic VTI:  0.19 m Systemic Diam: 2.20 cm Zoila Shutter MD Electronically  signed by Zoila Shutter MD Signature Date/Time: 12/14/2022/2:41:36 PM    Final    EP STUDY  Result Date: 12/14/2022 See surgical note for result.       Scheduled Meds:  apixaban  5 mg Oral BID   carvedilol  25 mg Oral BID WC   feeding supplement (GLUCERNA SHAKE)  237 mL Oral TID BM   fluticasone furoate-vilanterol  1 puff Inhalation Daily   insulin aspart  0-5 Units Subcutaneous QHS   insulin aspart  0-9 Units Subcutaneous TID WC   levothyroxine  25 mcg Oral Daily   losartan  100 mg Oral Daily   polyethylene glycol  17 g Oral Daily   rosuvastatin  20 mg Oral Daily   senna-docusate  2 tablet Oral BID   sertraline  150 mg Oral Daily   spironolactone  12.5 mg Oral Daily   Continuous Infusions:  cefTRIAXone (ROCEPHIN)  IV 2 g (12/15/22 1705)   gentamicin 240 mg (12/16/22 0912)     LOS: 5 days     Huey Bienenstock, MD [facility]   To contact the attending provider between 7A-7P or the covering provider during after hours 7P-7A, please log into the web site [email or web address] and access using universal [place] password for that web site. If you do not have the password, please call the hospital operator.  12/16/2022, 10:17 AM

## 2022-12-17 DIAGNOSIS — I358 Other nonrheumatic aortic valve disorders: Secondary | ICD-10-CM | POA: Diagnosis not present

## 2022-12-17 LAB — BASIC METABOLIC PANEL
Anion gap: 7 (ref 5–15)
BUN: 16 mg/dL (ref 8–23)
CO2: 22 mmol/L (ref 22–32)
Calcium: 8.8 mg/dL — ABNORMAL LOW (ref 8.9–10.3)
Chloride: 100 mmol/L (ref 98–111)
Creatinine, Ser: 0.7 mg/dL (ref 0.61–1.24)
GFR, Estimated: >60 mL/min (ref >60)
Glucose, Bld: 97 mg/dL (ref 70–99)
Potassium: 4.7 mmol/L (ref 3.5–5.1)
Sodium: 129 mmol/L — ABNORMAL LOW (ref 135–145)

## 2022-12-17 LAB — GLUCOSE, CAPILLARY
Glucose-Capillary: 85 mg/dL (ref 70–99)
Glucose-Capillary: 113 mg/dL — ABNORMAL HIGH (ref 70–99)
Glucose-Capillary: 138 mg/dL — ABNORMAL HIGH (ref 70–99)
Glucose-Capillary: 117 mg/dL — ABNORMAL HIGH (ref 70–99)

## 2022-12-17 LAB — CBC
HCT: 34.1 % — ABNORMAL LOW (ref 39.0–52.0)
Hemoglobin: 11 g/dL — ABNORMAL LOW (ref 13.0–17.0)
MCH: 24.7 pg — ABNORMAL LOW (ref 26.0–34.0)
MCHC: 32.3 g/dL (ref 30.0–36.0)
MCV: 76.6 fL — ABNORMAL LOW (ref 80.0–100.0)
Platelets: 402 10*3/uL — ABNORMAL HIGH (ref 150–400)
RBC: 4.45 MIL/uL (ref 4.22–5.81)
RDW: 16.1 % — ABNORMAL HIGH (ref 11.5–15.5)
WBC: 14.6 10*3/uL — ABNORMAL HIGH (ref 4.0–10.5)
nRBC: 0 % (ref 0.0–0.2)

## 2022-12-17 LAB — GENTAMICIN LEVEL, TROUGH: Gentamicin Trough: 1.3 ug/mL (ref 0.5–2.0)

## 2022-12-17 MED ORDER — GENTAMICIN SULFATE 40 MG/ML IJ SOLN
180.0000 mg | INTRAVENOUS | Status: DC
  Administered 2022-12-18 – 2022-12-23 (×6): 180 mg via INTRAVENOUS
  Filled 2022-12-17 (×7): qty 4.5

## 2022-12-17 NOTE — Progress Notes (Signed)
Pharmacy Antibiotic Note  [name] is a 72 y.o. male s/p TAVR admitted on 12/11/2022 with Strep infantarius bacteremia. 7/15 TEE confirmed aortic valve endocarditis. Currently on ceftriaxone and gentamicin. Penicillin susceptibility to S. Infantarius is intermediate (MIC = 0.25); added gentamicin for synergy. Renal function is stable with Scr 0.7, CrCl 94.3 ml/min.   7/18 Gentamicin trough today is 1.3. Goal trough < 1.0. Pharmacy has been consulted for gentamicin dosing.  Plan: Reduce gentamicin dose to 180 mg (2.25 mg/kg IBW) IV Q24H. Monitor renal function.  Weight: 81.6 kg (179 lb 14.3 oz)  Temp (24hrs), Avg:98.7 F (37.1 C), Min:97.9 F (36.6 C), Max:99.6 F (37.6 C)  Recent Labs  Lab 12/11/22 1649 12/11/22 1840 12/12/22 0535 12/13/22 0302 12/14/22 0549 12/15/22 0153 12/16/22 0558 12/17/22 0336 12/17/22 0959  WBC 9.6  --    < > 9.4 11.0* 11.8* 14.1* 14.6*  --   CREATININE 0.78  --    < > 0.75 0.85 0.94 0.86 0.70  --   LATICACIDVEN 0.9 1.0  --   --   --   --   --   --   --   GENTTROUGH  --   --   --   --   --   --   --   --  1.3   < > = values in this interval not displayed.    Estimated Creatinine Clearance: 94.3 mL/min (by C-G formula based on SCr of 0.7 mg/dL).    Allergies  Allergen Reactions   Bee Venom Anaphylaxis   Bystolic [Nebivolol Hcl] Other (See Comments)    Nightmares, flashbacks (PTSD)   Codeine Shortness Of Breath and Rash   Doxycycline Shortness Of Breath and Rash   Iodinated Contrast Media Shortness Of Breath and Anaphylaxis    Lost consciousness/difficulty breathing Omni - Paque Contrast    Iohexol Hives, Shortness Of Breath and Rash    Neck and Torso erythemia IVP-DYE >> SHORTNESS OF BREATH    Levaquin [Levofloxacin] Hives and Shortness Of Breath   Oxycodone Hcl Shortness Of Breath, Swelling and Rash   Stadol [Butorphanol] Shortness Of Breath   Succinylcholine Chloride Shortness Of Breath, Nausea Only, Rash and Other (See Comments)     Difficulty breathing    Tamiflu [Oseltamivir Phosphate]     UNSPECIFIED REACTION    Atorvastatin     myalgias   Synvisc [Hylan G-F 20] Hives and Rash   Gemfibrozil     Other reaction(s): Serum cholesterol raised   Latex Other (See Comments)    blister   Lisinopril     Other reaction(s): Cough   Omeprazole    Dilaudid [Hydromorphone Hcl] Nausea Only and Rash    Aggressive    Methocarbamol Nausea Only and Rash   Niacin Rash   Pentazocine Lactate Rash    rash    Antimicrobials this admission: Ceftriaxone 7/12 >> Gentamicin 7/14 >>   Dose adjustments this admission: 7/18: Gentamicin trough 1.3. Reduced dose from 240 mg to 180 mg.   Microbiology results: 7/10 BCx: Strep infantarius 7/12 BCx: NG x 5 days   Thank you for allowing pharmacy to be a part of this patient's care.  Lendon Ka, PharmD Candidate  12/17/2022 11:54 AM

## 2022-12-17 NOTE — Progress Notes (Signed)
PROGRESS NOTE    [name]  ZOX:[id number] DOB: 1951-01-08 DOA: 12/11/2022 PCP: [name], MD    Chief Complaint  Patient presents with   Abnormal labs    Brief Narrative:    [name] is a 72 y.o. male with medical history significant of history of TAVR, COPD, mild dementia, GERD, history of CVA, diabetes, hypertension, atrial fibrillation on anticoagulation.  Patient with recent hospitalization secondary to fall, he was called back to ED as his blood cultures growing Streptococcus, workup significant for TAVR endocarditis.    Assessment & Plan:  Principal Problem:   Aortic valve endocarditis Active Problems:   Diabetes mellitus type 2, controlled, with complications (HCC)   Hypertensive heart disease with chronic diastolic congestive heart failure (HCC)   GERD   COPD mixed type (HCC)   S/P TAVR (transcatheter aortic valve replacement)   Essential hypertension   Acquired hypothyroidism   Streptococcus bacteremia, infected endocarditis of the TAVR valve -Follow on repeat blood cultures, surveillance cultures as admission remains negative -2D echo is significant of mobile mass and mitral valve appears to be attached to aortic prosthesis and some few, the patient went for TEE on 7/15, which was significant for Infective endocarditis of the TAVR valve was noted with a small mobile vegetation in the LVOT - there is at least mild prosthetic valve stenosis and no AI.  -Cardiology consult greatly appreciated, plan for outpatient follow-up and repeat echo after antibiotics is finished -Antibiotics management per ID, continue with IV Rocephin and gentamicin, will need 2 weeks of IV gentamicin, ID would like to keep him until at least next week to ensure he tolerates high risk medication well.   -Surveillance blood cultures are negative, will request PICC line insertion.  Falls -Continue with PT/OT  Hypertension - Continue blood pressure medications  History of  TAVR  COPD - Continue long-acting inhalers  Essential hypertension - Continue Coreg, losartan   Mixed hyperlipidemia - Continue Crestor   Persistent atrial fibrillation - Continue Coreg, Eliquis   Acquired hypothyroidism - Continue Synthroid  Hyponatremia - improving     DVT prophylaxis: Eliquis Code Status: Full Family Communication: None at bedside Disposition:   Status is: Inpatient  Consultants:  ID Cardiology   Subjective:  No significant events overnight, he denies any complaints.  Objective: Vitals:   12/16/22 2300 12/17/22 0300 12/17/22 0745 12/17/22 0911  BP: 138/80 (!) 140/73  115/70  Pulse: 60 (!) 55 (!) 58 65  Resp: 17 15 16 16   Temp: 98.8 F (37.1 C) 98.6 F (37 C)  97.9 F (36.6 C)  TempSrc: Oral Oral  Oral  SpO2: 99% 98% 99% 100%  Weight:        Intake/Output Summary (Last 24 hours) at 12/17/2022 1156 Last data filed at 12/17/2022 0738 Gross per 24 hour  Intake 480 ml  Output 750 ml  Net -270 ml   Filed Weights   12/11/22 2100  Weight: 81.6 kg    Examination:  Awake Alert, Oriented X 3, frail, deconditioned Symmetrical Chest wall movement, Good air movement bilaterally, CTAB RRR,No Gallops,Rubs or new Murmurs, No Parasternal Heave +ve B.Sounds, Abd Soft, No tenderness, No rebound - guarding or rigidity. No Cyanosis, Clubbing or edema, No new Rash or bruise           Data Reviewed: I have personally reviewed following labs and imaging studies  CBC: Recent Labs  Lab 12/11/22 1649 12/12/22 0535 12/13/22 0302 12/14/22 8119 12/15/22 0153 12/16/22 1478 12/17/22 2956  WBC 9.6   < > 9.4 11.0* 11.8* 14.1* 14.6*  NEUTROABS 6.7  --   --   --   --   --   --   HGB 10.4*   < > 9.6* 9.9* 9.4* 10.6* 11.0*  HCT 32.7*   < > 30.2* 30.9* 30.6* 33.5* 34.1*  MCV 76.2*   < > 76.6* 73.9* 76.3* 74.9* 76.6*  PLT 338   < > 341 372 365 406* 402*   < > = values in this interval not displayed.    Basic Metabolic Panel: Recent Labs   Lab 12/13/22 0302 12/14/22 0549 12/15/22 0153 12/16/22 0558 12/17/22 0336  NA 128* 133* 131* 131* 129*  K 3.9 4.3 4.4 4.0 4.7  CL 99 98 100 97* 100  CO2 22 23 25 23 22   GLUCOSE 100* 86 98 84 97  BUN 11 13 15 12 16   CREATININE 0.75 0.85 0.94 0.86 0.70  CALCIUM 8.5* 9.1 8.6* 9.3 8.8*  PHOS 3.3  --   --   --   --     GFR: Estimated Creatinine Clearance: 94.3 mL/min (by C-G formula based on SCr of 0.7 mg/dL).  Liver Function Tests: Recent Labs  Lab 12/11/22 1649  AST 17  ALT 14  ALKPHOS 88  BILITOT 0.6  PROT 8.2*  ALBUMIN 2.8*    CBG: Recent Labs  Lab 12/16/22 0757 12/16/22 1249 12/16/22 1723 12/16/22 2146 12/17/22 0741  GLUCAP 88 112* 121* 138* 85     Recent Results (from the past 240 hour(s))  Culture, blood (Routine x 2)     Status: Abnormal   Collection Time: 12/09/22  8:56 PM   Specimen: BLOOD RIGHT FOREARM  Result Value Ref Range Status   Specimen Description   Final    BLOOD RIGHT FOREARM Performed at [facility], [address] [place], [place] [address]    Special Requests   Final    BOTTLES DRAWN AEROBIC AND ANAEROBIC Blood Culture results may not be optimal due to an excessive volume of blood received in culture bottles Performed at [facility], [address], [place], [place] [address]    Culture  Setup Time   Final    GRAM POSITIVE COCCI IN BOTH AEROBIC AND ANAEROBIC BOTTLES Gram Stain Report Called to,Read Back By and Verified With: [facility] @ 1517 ON 12/10/2022 BY FRATTO,ASHLEY CRITICAL RESULT CALLED TO, READ BACK BY AND VERIFIED WITH: RN Marisa Hua [phone] @2242  FH Performed at [facility], [address], [place], [place] [address]    Culture STREPTOCOCCUS INFANTARIUS (A)  Final   Report Status 12/14/2022 FINAL  Final   Organism ID, Bacteria STREPTOCOCCUS INFANTARIUS  Final      Susceptibility   Streptococcus infantarius - MIC*    ERYTHROMYCIN <=0.12 SENSITIVE Sensitive     TETRACYCLINE <=0.25 SENSITIVE Sensitive      VANCOMYCIN 0.5 SENSITIVE Sensitive     CLINDAMYCIN <=0.25 SENSITIVE Sensitive     PENICILLIN 0.25 INTERMEDIATE Intermediate     CEFTRIAXONE 0.25 SENSITIVE Sensitive     LEVOFLOXACIN 2 SENSITIVE Sensitive     * STREPTOCOCCUS INFANTARIUS  Blood Culture ID Panel (Reflexed)     Status: Abnormal   Collection Time: 12/09/22  8:56 PM  Result Value Ref Range Status   Enterococcus faecalis NOT DETECTED NOT DETECTED Final   Enterococcus Faecium NOT DETECTED NOT DETECTED Final   Listeria monocytogenes NOT DETECTED NOT DETECTED Final   Staphylococcus species NOT DETECTED NOT DETECTED Final   Staphylococcus  aureus (BCID) NOT DETECTED NOT DETECTED Final   Staphylococcus epidermidis NOT DETECTED NOT DETECTED Final   Staphylococcus lugdunensis NOT DETECTED NOT DETECTED Final   Streptococcus species DETECTED (A) NOT DETECTED Final    Comment: Not Enterococcus species, Streptococcus agalactiae, Streptococcus pyogenes, or Streptococcus pneumoniae. CRITICAL RESULT CALLED TO, READ BACK BY AND VERIFIED WITH: RN C. TURNER [phone] @2242  FH    Streptococcus agalactiae NOT DETECTED NOT DETECTED Final   Streptococcus pneumoniae NOT DETECTED NOT DETECTED Final   Streptococcus pyogenes NOT DETECTED NOT DETECTED Final   A.calcoaceticus-baumannii NOT DETECTED NOT DETECTED Final   Bacteroides fragilis NOT DETECTED NOT DETECTED Final   Enterobacterales NOT DETECTED NOT DETECTED Final   Enterobacter cloacae complex NOT DETECTED NOT DETECTED Final   Escherichia coli NOT DETECTED NOT DETECTED Final   Klebsiella aerogenes NOT DETECTED NOT DETECTED Final   Klebsiella oxytoca NOT DETECTED NOT DETECTED Final   Klebsiella pneumoniae NOT DETECTED NOT DETECTED Final   Proteus species NOT DETECTED NOT DETECTED Final   Salmonella species NOT DETECTED NOT DETECTED Final   Serratia marcescens NOT DETECTED NOT DETECTED Final   Haemophilus influenzae NOT DETECTED NOT DETECTED Final   Neisseria meningitidis NOT DETECTED NOT  DETECTED Final   Pseudomonas aeruginosa NOT DETECTED NOT DETECTED Final   Stenotrophomonas maltophilia NOT DETECTED NOT DETECTED Final   Candida albicans NOT DETECTED NOT DETECTED Final   Candida auris NOT DETECTED NOT DETECTED Final   Candida glabrata NOT DETECTED NOT DETECTED Final   Candida krusei NOT DETECTED NOT DETECTED Final   Candida parapsilosis NOT DETECTED NOT DETECTED Final   Candida tropicalis NOT DETECTED NOT DETECTED Final   Cryptococcus neoformans/gattii NOT DETECTED NOT DETECTED Final    Comment: Performed at [facility], [address], [place], [place] [address]  Culture, blood (Routine x 2)     Status: Abnormal   Collection Time: 12/09/22  9:25 PM   Specimen: BLOOD LEFT FOREARM  Result Value Ref Range Status   Specimen Description   Final    BLOOD LEFT FOREARM Performed at [facility], [address] [place], [place] [address]    Special Requests   Final    BOTTLES DRAWN AEROBIC AND ANAEROBIC Blood Culture adequate volume Performed at [facility], [address], [place], [place] [address]    Culture  Setup Time   Final    GRAM POSITIVE COCCI IN BOTH AEROBIC AND ANAEROBIC BOTTLES Gram Stain Report Called to,Read Back By and Verified With: TURNER, CHARLOTTE @ 1618 ON 12/10/2022 BY Yehuda Savannah Performed at [facility], [address] [place], [place] [address]    Culture (A)  Final    STREPTOCOCCUS INFANTARIUS SUSCEPTIBILITIES PERFORMED ON PREVIOUS CULTURE WITHIN THE LAST 5 DAYS. Performed at [facility], [address], [place], [place] [address]    Report Status 12/12/2022 FINAL  Final  Resp panel by RT-PCR (RSV, Flu A&B, Covid) Anterior Nasal Swab     Status: None   Collection Time: 12/09/22 10:43 PM   Specimen: Anterior Nasal Swab  Result Value Ref Range Status   SARS Coronavirus 2 by RT PCR NEGATIVE NEGATIVE Final    Comment: (NOTE) SARS-CoV-2 target nucleic acids are NOT DETECTED.  The SARS-CoV-2 RNA is generally detectable  in upper respiratory specimens during the acute phase of infection. The lowest concentration of SARS-CoV-2 viral copies this assay can detect is 138 copies/mL. A negative result does not preclude SARS-Cov-2 infection and should not be used as the sole  basis for treatment or other patient management decisions. A negative result may occur with  improper specimen collection/handling, submission of specimen other than nasopharyngeal swab, presence of viral mutation(s) within the areas targeted by this assay, and inadequate number of viral copies(<138 copies/mL). A negative result must be combined with clinical observations, patient history, and epidemiological information. The expected result is Negative.  Fact Sheet for Patients:  BloggerCourse.com  Fact Sheet for Healthcare Providers:  SeriousBroker.it  This test is no t yet approved or cleared by the [place] FDA and  has been authorized for detection and/or diagnosis of SARS-CoV-2 by FDA under an Emergency Use Authorization (EUA). This EUA will remain  in effect (meaning this test can be used) for the duration of the COVID-19 declaration under Section 564(b)(1) of the Act, 21 U.S.C.section 360bbb-3(b)(1), unless the authorization is terminated  or revoked sooner.       Influenza A by PCR NEGATIVE NEGATIVE Final   Influenza B by PCR NEGATIVE NEGATIVE Final    Comment: (NOTE) The Xpert Xpress SARS-CoV-2/FLU/RSV plus assay is intended as an aid in the diagnosis of influenza from Nasopharyngeal swab specimens and should not be used as a sole basis for treatment. Nasal washings and aspirates are unacceptable for Xpert Xpress SARS-CoV-2/FLU/RSV testing.  Fact Sheet for Patients: BloggerCourse.com  Fact Sheet for Healthcare Providers: SeriousBroker.it  This test is not yet approved or cleared by the [place] FDA and has  been authorized for detection and/or diagnosis of SARS-CoV-2 by FDA under an Emergency Use Authorization (EUA). This EUA will remain in effect (meaning this test can be used) for the duration of the COVID-19 declaration under Section 564(b)(1) of the Act, 21 U.S.C. section 360bbb-3(b)(1), unless the authorization is terminated or revoked.     Resp Syncytial Virus by PCR NEGATIVE NEGATIVE Final    Comment: (NOTE) Fact Sheet for Patients: BloggerCourse.com  Fact Sheet for Healthcare Providers: SeriousBroker.it  This test is not yet approved or cleared by the [place] FDA and has been authorized for detection and/or diagnosis of SARS-CoV-2 by FDA under an Emergency Use Authorization (EUA). This EUA will remain in effect (meaning this test can be used) for the duration of the COVID-19 declaration under Section 564(b)(1) of the Act, 21 U.S.C. section 360bbb-3(b)(1), unless the authorization is terminated or revoked.  Performed at [facility], [address] [place], [place] [address]   Culture, blood (routine x 2)     Status: None   Collection Time: 12/11/22  4:49 PM   Specimen: Left Antecubital; Blood  Result Value Ref Range Status   Specimen Description LEFT ANTECUBITAL  Final   Special Requests   Final    BOTTLES DRAWN AEROBIC AND ANAEROBIC Blood Culture adequate volume   Culture   Final    NO GROWTH 5 DAYS Performed at [facility], [address] [place], [place] [address]    Report Status 12/16/2022 FINAL  Final  Culture, blood (routine x 2)     Status: None   Collection Time: 12/11/22  4:49 PM   Specimen: Right Antecubital; Blood  Result Value Ref Range Status   Specimen Description RIGHT ANTECUBITAL  Final   Special Requests   Final    BOTTLES DRAWN AEROBIC AND ANAEROBIC Blood Culture adequate volume   Culture   Final    NO GROWTH 5 DAYS Performed at [facility], [address], [place], [place] [address]     Report Status 12/16/2022 FINAL  Final  Radiology Studies: [place] EKG SITE RITE  Result Date: 12/16/2022 If Site Rite image not attached, placement could not be confirmed due to current cardiac rhythm.       Scheduled Meds:  apixaban  5 mg Oral BID   carvedilol  25 mg Oral BID WC   feeding supplement (GLUCERNA SHAKE)  237 mL Oral TID BM   fluticasone furoate-vilanterol  1 puff Inhalation Daily   insulin aspart  0-5 Units Subcutaneous QHS   insulin aspart  0-9 Units Subcutaneous TID WC   levothyroxine  25 mcg Oral Daily   losartan  100 mg Oral Daily   rosuvastatin  20 mg Oral Daily   senna-docusate  2 tablet Oral BID   sertraline  150 mg Oral Daily   spironolactone  12.5 mg Oral Daily   Continuous Infusions:  cefTRIAXone (ROCEPHIN)  IV 2 g (12/16/22 1748)   gentamicin 240 mg (12/17/22 1053)     LOS: 6 days     Huey Bienenstock, MD [facility]   To contact the attending provider between 7A-7P or the covering provider during after hours 7P-7A, please log into the web site [email or web address] and access using universal [place] password for that web site. If you do not have the password, please call the hospital operator.  12/17/2022, 11:56 AM

## 2022-12-17 NOTE — Progress Notes (Signed)
Physical Therapy Treatment Patient Details Name: [name] MRN: [id number] DOB: 12/17/1950 Today's Date: 12/17/2022   History of Present Illness Pt is a 72 y.o. male admitted s/p fall and recent + blood cultures for  Streptococcus infantarius. 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  Pt with PMH significant of history of TAVR, COPD, mild dementia, GERD, history of CVA, diabetes, hypertension, atrial fibrillation on anticoagulation.  Patient with recent admission due to fever. He also relates a history of falling.  He falls once twice a week where he loses his balance backwards because he feels lightheaded. This has been going on for the past few months.  He has been referred to cardiology and neurology for evaluation of his falls.    PT Comments  Patient continues to report that he does not get dizzy prior to his falls--which are almost always backwards and preceded by a feeling of imbalance.  Assessed for peripheral vestibular deficit with +head impulse test. Educated on VORx1 exercise with pt reporting same sense of imbalance as he has prior to falls. Educated to complete exercise for up to 60 seconds (with target stationary and clear), 3 reps, 3 times per day. Handout provided (written as could not get connected to nursing unit printer).      Assistance Recommended at Discharge Frequent or constant Supervision/Assistance  If plan is discharge home, recommend the following:  Can travel by private vehicle    A little help with walking and/or transfers;A little help with bathing/dressing/bathroom      Equipment Recommendations  None recommended by PT    Recommendations for Other Services       Precautions / Restrictions Precautions Precautions: Fall Precaution Comments: denies dizziness when he falls, "just get off balance and fall backwards" Restrictions Weight Bearing Restrictions: No     Mobility  Bed Mobility Overal bed  mobility: Modified Independent             General bed mobility comments: moves slowly, used railing    Transfers Overall transfer level: Needs assistance Equipment used: Rolling walker (2 wheels) Transfers: Sit to/from Stand Sit to Stand: Min guard, Min assist           General transfer comment: vc for proper hand placement; repeated x 3 reps with +recall on 2-3 attempts; pt required initial assist for anterior wt-shift, 2-3rd reps minguard assist    Ambulation/Gait Ambulation/Gait assistance: Min guard Gait Distance (Feet): 80 Feet Assistive device: Rolling walker (2 wheels) Gait Pattern/deviations: Step-through pattern, Trunk flexed Gait velocity: slow pace     General Gait Details: vc for pushing RW slightly farther ahead as he has tendency to lose his balance backwards (per his report); slow advancement; no imbalance during ambulation; guarding for safety due to ++h/o falls   Stairs             Wheelchair Mobility     Tilt Bed    Modified Rankin (Stroke Patients Only)       Balance Overall balance assessment: Needs assistance Sitting-balance support: Feet supported, No upper extremity supported Sitting balance-Leahy Scale: Good     Standing balance support: Bilateral upper extremity supported, Reliant on assistive device for balance Standing balance-Leahy Scale: Poor                              Cognition Arousal/Alertness: Awake/alert Behavior During Therapy: WFL for tasks assessed/performed Overall Cognitive Status: Within Functional  Limits for tasks assessed                                          Exercises Other Exercises Other Exercises: sit to stand x 3 reps and then RN in to start IV Other Exercises: VORx1 horizontal head shakes with pt only able to keep eyes on target and in focus with slow head turns and creates +imbalance feeling that he states he feels when he loses his balanceAccess Code:  [email or web address] URL: [email or web address] Date: 12/17/2022 Prepared by: [facility]  Exercises - Seated Gaze Stabilization with Head Rotation  - 3 x daily - 7 x weekly - 3 reps - 30-60 seconds hold    General Comments        Pertinent Vitals/Pain Pain Assessment Pain Assessment: No/denies pain    Home Living                          Prior Function            PT Goals (current goals can now be found in the care plan section) Acute Rehab PT Goals Patient Stated Goal: figure out why I fall Time For Goal Achievement: 12/26/22 Potential to Achieve Goals: Good Progress towards PT goals: Progressing toward goals    Frequency    Min 2X/week      PT Plan Discharge plan needs to be updated    Co-evaluation              AM-PAC PT "6 Clicks" Mobility   Outcome Measure  Help needed turning from your back to your side while in a flat bed without using bedrails?: A Little Help needed moving from lying on your back to sitting on the side of a flat bed without using bedrails?: A Little Help needed moving to and from a bed to a chair (including a wheelchair)?: A Little Help needed standing up from a chair using your arms (e.g., wheelchair or bedside chair)?: A Little Help needed to walk in hospital room?: A Little Help needed climbing 3-5 steps with a railing? : A Little 6 Click Score: 18    End of Session Equipment Utilized During Treatment: Gait belt Activity Tolerance: Patient limited by fatigue Patient left: in chair;Other (comment);with chair alarm set (with RN) Nurse Communication: Mobility status PT Visit Diagnosis: Repeated falls (R29.6);Other abnormalities of gait and mobility (R26.89);History of falling (Z91.81);Unsteadiness on feet (R26.81)     Time: 8119-1478 PT Time Calculation (min) (ACUTE ONLY): 32 min  Charges:    $Gait Training: 8-22 mins $Therapeutic Exercise: 8-22 mins PT General Charges $$ ACUTE PT VISIT: 1 Visit                       Jerolyn Center, PT Acute Rehabilitation Services  Office [phone]    Zena Amos 12/17/2022, 9:36 AM

## 2022-12-17 NOTE — Progress Notes (Signed)
Secure chat sent to Lajuana Ripple that plan to place PICC Friday.

## 2022-12-17 NOTE — Plan of Care (Signed)

## 2022-12-18 DIAGNOSIS — I358 Other nonrheumatic aortic valve disorders: Secondary | ICD-10-CM | POA: Diagnosis not present

## 2022-12-18 LAB — GLUCOSE, CAPILLARY
Glucose-Capillary: 101 mg/dL — ABNORMAL HIGH (ref 70–99)
Glucose-Capillary: 123 mg/dL — ABNORMAL HIGH (ref 70–99)
Glucose-Capillary: 104 mg/dL — ABNORMAL HIGH (ref 70–99)
Glucose-Capillary: 109 mg/dL — ABNORMAL HIGH (ref 70–99)

## 2022-12-18 LAB — BASIC METABOLIC PANEL
Anion gap: 11 (ref 5–15)
BUN: 17 mg/dL (ref 8–23)
CO2: 24 mmol/L (ref 22–32)
Calcium: 8.9 mg/dL (ref 8.9–10.3)
Chloride: 92 mmol/L — ABNORMAL LOW (ref 98–111)
Creatinine, Ser: 0.86 mg/dL (ref 0.61–1.24)
GFR, Estimated: >60 mL/min (ref >60)
Glucose, Bld: 87 mg/dL (ref 70–99)
Potassium: 4 mmol/L (ref 3.5–5.1)
Sodium: 127 mmol/L — ABNORMAL LOW (ref 135–145)

## 2022-12-18 MED ORDER — LACTATED RINGERS IV BOLUS
1000.0000 mL | Freq: Once | INTRAVENOUS | Status: AC
  Administered 2022-12-18: 1000 mL via INTRAVENOUS

## 2022-12-18 MED ORDER — LOSARTAN POTASSIUM 50 MG PO TABS
50.0000 mg | ORAL_TABLET | Freq: Every day | ORAL | Status: DC
  Administered 2022-12-19 – 2022-12-24 (×4): 50 mg via ORAL
  Filled 2022-12-18 (×4): qty 1

## 2022-12-18 NOTE — Plan of Care (Signed)

## 2022-12-18 NOTE — Progress Notes (Signed)
PROGRESS NOTE    KERMIT ARNETTE  JJO:[id number] DOB: 10/04/1950 DOA: 12/11/2022 PCP: [name], MD    Chief Complaint  Patient presents with   Abnormal labs    Brief Narrative:    TALOR CHEEMA is a 72 y.o. male with medical history significant of history of TAVR, COPD, mild dementia, GERD, history of CVA, diabetes, hypertension, atrial fibrillation on anticoagulation.  Patient with recent hospitalization secondary to fall, he was called back to ED as his blood cultures growing Streptococcus, workup significant for TAVR endocarditis.  The patient has been evaluated by ID. The recommendation is for eight weeks of ceftriaxone 2 gm IV daily and 2 weeks of IV gentamicin.   On 12/18/2022 the patient was given a 1 L bolus of LR for hypotension 83/52. Will monitor and possibly decrease or discontinue the dose of coreg before evening dose is due.  Assessment & Plan:  Principal Problem:   Aortic valve endocarditis Active Problems:   Diabetes mellitus type 2, controlled, with complications (HCC)   Hypertensive heart disease with chronic diastolic congestive heart failure (HCC)   GERD   COPD mixed type (HCC)   S/P TAVR (transcatheter aortic valve replacement)   Essential hypertension   Acquired hypothyroidism   Streptococcus bacteremia, infected endocarditis of the TAVR valve -Follow on repeat blood cultures, surveillance cultures as admission remains negative -2D echo is significant of mobile mass and mitral valve appears to be attached to aortic prosthesis and some few, the patient went for TEE on 7/15, which was significant for Infective endocarditis of the TAVR valve was noted with a small mobile vegetation in the LVOT - there is at least mild prosthetic valve stenosis and no AI.  -Cardiology consult greatly appreciated, plan for outpatient follow-up and repeat echo after antibiotics is finished -Antibiotics : Management per ID. The recommendation is for eight weeks of  ceftriaxone 2 gm IV daily and 2 weeks of IV gentamicin.  -Surveillance blood cultures are negative. -PICC line to be placed on 12/18/2022. Will need home health on dc.  Falls -Continue with PT/OT  Hypertension - Continue blood pressure medications  History of TAVR  COPD - Continue long-acting inhalers  Essential hypertension - Continue Coreg, losartan   Mixed hyperlipidemia - Continue Crestor   Persistent atrial fibrillation - Continue Coreg, Eliquis. May need to decrease dose of discontinue Coreg due to hypotension. Monitor.   Acquired hypothyroidism - Continue Synthroid  Hyponatremia - improving     DVT prophylaxis: Eliquis Code Status: Full Family Communication: None at bedside Disposition: Probably home with home health services.  Status is: Inpatient  Consultants:  ID Cardiology   Subjective:  No significant events overnight, he denies any complaints.  Objective: Vitals:   12/17/22 2140 12/18/22 0910 12/18/22 0913 12/18/22 1200  BP: 132/68  123/63 (!) 83/51  Pulse: (!) 102 (!) 101 (!) 102 (!) 59  Resp: 16 17 16 16   Temp: 98 F (36.7 C)  98 F (36.7 C) 98.4 F (36.9 C)  TempSrc: Oral  Oral Oral  SpO2:  99%  100%  Weight:        Intake/Output Summary (Last 24 hours) at 12/18/2022 1521 Last data filed at 12/17/2022 1824 Gross per 24 hour  Intake --  Output 100 ml  Net -100 ml   Filed Weights   12/11/22 2100  Weight: 81.6 kg    Examination:  Exam:  Constitutional:  The patient is awake, alert, and oriented x 3. No acute distress. Respiratory:  No increased work of breathing. No wheezes, rales, or rhonchi No tactile fremitus Cardiovascular:  Regular rate and rhythm No murmurs, ectopy, or gallups. No lateral PMI. No thrills. Abdomen:  Abdomen is soft, non-tender, non-distended No hernias, masses, or organomegaly Normoactive bowel sounds.  Musculoskeletal:  No cyanosis, clubbing, or edema Skin:  No rashes, lesions,  ulcers palpation of skin: no induration or nodules Neurologic:  CN 2-12 intact Sensation all 4 extremities intact Psychiatric:  Mental status Mood, affect appropriate Orientation to person, place, time  judgment and insight appear intact  Data Reviewed: I have personally reviewed following labs and imaging studies  CBC    Component Value Date/Time   WBC 14.6 (H) 12/17/2022 0336   RBC 4.45 12/17/2022 0336   HGB 11.0 (L) 12/17/2022 0336   HGB 14.0 07/06/2019 0900   HCT 34.1 (L) 12/17/2022 0336   HCT 43.3 07/06/2019 0900   PLT 402 (H) 12/17/2022 0336   PLT 283 07/06/2019 0900   MCV 76.6 (L) 12/17/2022 0336   MCV 91 07/06/2019 0900   MCH 24.7 (L) 12/17/2022 0336   MCHC 32.3 12/17/2022 0336   RDW 16.1 (H) 12/17/2022 0336   RDW 13.0 07/06/2019 0900   LYMPHSABS 1.7 12/11/2022 1649   MONOABS 0.7 12/11/2022 1649   EOSABS 0.4 12/11/2022 1649   BASOSABS 0.1 12/11/2022 1649   Basic Metabolic Panel: Recent Labs  Lab 12/13/22 0302 12/14/22 0549 12/15/22 0153 12/16/22 0558 12/17/22 0336 12/18/22 0748  NA 128* 133* 131* 131* 129* 127*  K 3.9 4.3 4.4 4.0 4.7 4.0  CL 99 98 100 97* 100 92*  CO2 22 23 25 23 22 24   GLUCOSE 100* 86 98 84 97 87  BUN 11 13 15 12 16 17   CREATININE 0.75 0.85 0.94 0.86 0.70 0.86  CALCIUM 8.5* 9.1 8.6* 9.3 8.8* 8.9  PHOS 3.3  --   --   --   --   --     GFR: Estimated Creatinine Clearance: 87.7 mL/min (by C-G formula based on SCr of 0.86 mg/dL).  Liver Function Tests: Recent Labs  Lab 12/11/22 1649  AST 17  ALT 14  ALKPHOS 88  BILITOT 0.6  PROT 8.2*  ALBUMIN 2.8*    CBG: Recent Labs  Lab 12/17/22 1223 12/17/22 1605 12/17/22 2141 12/18/22 0831 12/18/22 1135  GLUCAP 113* 138* 117* 101* 123*     Recent Results (from the past 240 hour(s))  Culture, blood (Routine x 2)     Status: Abnormal   Collection Time: 12/09/22  8:56 PM   Specimen: BLOOD RIGHT FOREARM  Result Value Ref Range Status   Specimen Description   Final    BLOOD  RIGHT FOREARM Performed at [facility], [address] [place], [place] [address]    Special Requests   Final    BOTTLES DRAWN AEROBIC AND ANAEROBIC Blood Culture results may not be optimal due to an excessive volume of blood received in culture bottles Performed at [facility], [address] [place], [place] [address]    Culture  Setup Time   Final    GRAM POSITIVE COCCI IN BOTH AEROBIC AND ANAEROBIC BOTTLES Gram Stain Report Called to,Read Back By and Verified With: [facility] @ 1517 ON 12/10/2022 BY FRATTO,ASHLEY CRITICAL RESULT CALLED TO, READ BACK BY AND VERIFIED WITH: RN Marisa Hua [phone] @2242  FH Performed at [facility], [address], [place], [place] [address]    Culture STREPTOCOCCUS INFANTARIUS (A)  Final   Report Status 12/14/2022 FINAL  Final   Organism ID, Bacteria STREPTOCOCCUS INFANTARIUS  Final      Susceptibility   Streptococcus infantarius - MIC*    ERYTHROMYCIN <=0.12 SENSITIVE Sensitive     TETRACYCLINE <=0.25 SENSITIVE Sensitive     VANCOMYCIN 0.5 SENSITIVE Sensitive     CLINDAMYCIN <=0.25 SENSITIVE Sensitive     PENICILLIN 0.25 INTERMEDIATE Intermediate     CEFTRIAXONE 0.25 SENSITIVE Sensitive     LEVOFLOXACIN 2 SENSITIVE Sensitive     * STREPTOCOCCUS INFANTARIUS  Blood Culture ID Panel (Reflexed)     Status: Abnormal   Collection Time: 12/09/22  8:56 PM  Result Value Ref Range Status   Enterococcus faecalis NOT DETECTED NOT DETECTED Final   Enterococcus Faecium NOT DETECTED NOT DETECTED Final   Listeria monocytogenes NOT DETECTED NOT DETECTED Final   Staphylococcus species NOT DETECTED NOT DETECTED Final   Staphylococcus aureus (BCID) NOT DETECTED NOT DETECTED Final   Staphylococcus epidermidis NOT DETECTED NOT DETECTED Final   Staphylococcus lugdunensis NOT DETECTED NOT DETECTED Final   Streptococcus species DETECTED (A) NOT DETECTED Final    Comment: Not Enterococcus species, Streptococcus agalactiae, Streptococcus pyogenes, or Streptococcus  pneumoniae. CRITICAL RESULT CALLED TO, READ BACK BY AND VERIFIED WITH: RN C. TURNER [phone] @2242  FH    Streptococcus agalactiae NOT DETECTED NOT DETECTED Final   Streptococcus pneumoniae NOT DETECTED NOT DETECTED Final   Streptococcus pyogenes NOT DETECTED NOT DETECTED Final   A.calcoaceticus-baumannii NOT DETECTED NOT DETECTED Final   Bacteroides fragilis NOT DETECTED NOT DETECTED Final   Enterobacterales NOT DETECTED NOT DETECTED Final   Enterobacter cloacae complex NOT DETECTED NOT DETECTED Final   Escherichia coli NOT DETECTED NOT DETECTED Final   Klebsiella aerogenes NOT DETECTED NOT DETECTED Final   Klebsiella oxytoca NOT DETECTED NOT DETECTED Final   Klebsiella pneumoniae NOT DETECTED NOT DETECTED Final   Proteus species NOT DETECTED NOT DETECTED Final   Salmonella species NOT DETECTED NOT DETECTED Final   Serratia marcescens NOT DETECTED NOT DETECTED Final   Haemophilus influenzae NOT DETECTED NOT DETECTED Final   Neisseria meningitidis NOT DETECTED NOT DETECTED Final   Pseudomonas aeruginosa NOT DETECTED NOT DETECTED Final   Stenotrophomonas maltophilia NOT DETECTED NOT DETECTED Final   Candida albicans NOT DETECTED NOT DETECTED Final   Candida auris NOT DETECTED NOT DETECTED Final   Candida glabrata NOT DETECTED NOT DETECTED Final   Candida krusei NOT DETECTED NOT DETECTED Final   Candida parapsilosis NOT DETECTED NOT DETECTED Final   Candida tropicalis NOT DETECTED NOT DETECTED Final   Cryptococcus neoformans/gattii NOT DETECTED NOT DETECTED Final    Comment: Performed at [facility], [address], [place], [place] [address]  Culture, blood (Routine x 2)     Status: Abnormal   Collection Time: 12/09/22  9:25 PM   Specimen: BLOOD LEFT FOREARM  Result Value Ref Range Status   Specimen Description   Final    BLOOD LEFT FOREARM Performed at [facility], [address], [place], [place] [address]    Special Requests   Final    BOTTLES DRAWN AEROBIC AND  ANAEROBIC Blood Culture adequate volume Performed at [facility], [address], [place], [place] [address]    Culture  Setup Time   Final    GRAM POSITIVE COCCI IN BOTH AEROBIC AND ANAEROBIC BOTTLES Gram Stain Report Called to,Read Back By and Verified With: TURNER, CHARLOTTE @ 1618 ON 12/10/2022 BY Yehuda Savannah Performed at [facility], [address], [place], [place] [address]  Culture (A)  Final    STREPTOCOCCUS INFANTARIUS SUSCEPTIBILITIES PERFORMED ON PREVIOUS CULTURE WITHIN THE LAST 5 DAYS. Performed at [facility], [address], [place], [place] [address]    Report Status 12/12/2022 FINAL  Final  Resp panel by RT-PCR (RSV, Flu A&B, Covid) Anterior Nasal Swab     Status: None   Collection Time: 12/09/22 10:43 PM   Specimen: Anterior Nasal Swab  Result Value Ref Range Status   SARS Coronavirus 2 by RT PCR NEGATIVE NEGATIVE Final    Comment: (NOTE) SARS-CoV-2 target nucleic acids are NOT DETECTED.  The SARS-CoV-2 RNA is generally detectable in upper respiratory specimens during the acute phase of infection. The lowest concentration of SARS-CoV-2 viral copies this assay can detect is 138 copies/mL. A negative result does not preclude SARS-Cov-2 infection and should not be used as the sole basis for treatment or other patient management decisions. A negative result may occur with  improper specimen collection/handling, submission of specimen other than nasopharyngeal swab, presence of viral mutation(s) within the areas targeted by this assay, and inadequate number of viral copies(<138 copies/mL). A negative result must be combined with clinical observations, patient history, and epidemiological information. The expected result is Negative.  Fact Sheet for Patients:  BloggerCourse.com  Fact Sheet for Healthcare Providers:  SeriousBroker.it  This test is no t yet approved or cleared by the [place] FDA and   has been authorized for detection and/or diagnosis of SARS-CoV-2 by FDA under an Emergency Use Authorization (EUA). This EUA will remain  in effect (meaning this test can be used) for the duration of the COVID-19 declaration under Section 564(b)(1) of the Act, 21 U.S.C.section 360bbb-3(b)(1), unless the authorization is terminated  or revoked sooner.       Influenza A by PCR NEGATIVE NEGATIVE Final   Influenza B by PCR NEGATIVE NEGATIVE Final    Comment: (NOTE) The Xpert Xpress SARS-CoV-2/FLU/RSV plus assay is intended as an aid in the diagnosis of influenza from Nasopharyngeal swab specimens and should not be used as a sole basis for treatment. Nasal washings and aspirates are unacceptable for Xpert Xpress SARS-CoV-2/FLU/RSV testing.  Fact Sheet for Patients: BloggerCourse.com  Fact Sheet for Healthcare Providers: SeriousBroker.it  This test is not yet approved or cleared by the [place] FDA and has been authorized for detection and/or diagnosis of SARS-CoV-2 by FDA under an Emergency Use Authorization (EUA). This EUA will remain in effect (meaning this test can be used) for the duration of the COVID-19 declaration under Section 564(b)(1) of the Act, 21 U.S.C. section 360bbb-3(b)(1), unless the authorization is terminated or revoked.     Resp Syncytial Virus by PCR NEGATIVE NEGATIVE Final    Comment: (NOTE) Fact Sheet for Patients: BloggerCourse.com  Fact Sheet for Healthcare Providers: SeriousBroker.it  This test is not yet approved or cleared by the [place] FDA and has been authorized for detection and/or diagnosis of SARS-CoV-2 by FDA under an Emergency Use Authorization (EUA). This EUA will remain in effect (meaning this test can be used) for the duration of the COVID-19 declaration under Section 564(b)(1) of the Act, 21 U.S.C. section 360bbb-3(b)(1),  unless the authorization is terminated or revoked.  Performed at [facility], [address] [place], [place] [address]   Culture, blood (routine x 2)     Status: None   Collection Time: 12/11/22  4:49 PM   Specimen: Left Antecubital; Blood  Result Value Ref Range Status   Specimen Description LEFT ANTECUBITAL  Final  Special Requests   Final    BOTTLES DRAWN AEROBIC AND ANAEROBIC Blood Culture adequate volume   Culture   Final    NO GROWTH 5 DAYS Performed at [facility], [address], [place], [place] [address]    Report Status 12/16/2022 FINAL  Final  Culture, blood (routine x 2)     Status: None   Collection Time: 12/11/22  4:49 PM   Specimen: Right Antecubital; Blood  Result Value Ref Range Status   Specimen Description RIGHT ANTECUBITAL  Final   Special Requests   Final    BOTTLES DRAWN AEROBIC AND ANAEROBIC Blood Culture adequate volume   Culture   Final    NO GROWTH 5 DAYS Performed at [facility], [address], [place], [place] [address]    Report Status 12/16/2022 FINAL  Final    Scheduled Meds:  apixaban  5 mg Oral BID   carvedilol  25 mg Oral BID WC   feeding supplement (GLUCERNA SHAKE)  237 mL Oral TID BM   fluticasone furoate-vilanterol  1 puff Inhalation Daily   insulin aspart  0-5 Units Subcutaneous QHS   insulin aspart  0-9 Units Subcutaneous TID WC   levothyroxine  25 mcg Oral Daily   [START ON 12/19/2022] losartan  50 mg Oral Daily   rosuvastatin  20 mg Oral Daily   senna-docusate  2 tablet Oral BID   sertraline  150 mg Oral Daily   spironolactone  12.5 mg Oral Daily   Continuous Infusions:  cefTRIAXone (ROCEPHIN)  IV 2 g (12/17/22 1718)   gentamicin 180 mg (12/18/22 0901)     LOS: 7 days     Fran Lowes, MD [facility]   To contact the attending provider between 7A-7P or the covering provider during after hours 7P-7A, please log into the web site [email or web address] and access using universal [place] password for that web site. If  you do not have the password, please call the hospital operator.  12/18/2022, 3:21 PM

## 2022-12-18 NOTE — Plan of Care (Signed)
?  Problem: Clinical Measurements: ?Goal: Will remain free from infection ?Outcome: Progressing ?Goal: Respiratory complications will improve ?Outcome: Progressing ?  ?Problem: Elimination: ?Goal: Will not experience complications related to bowel motility ?Outcome: Progressing ?Goal: Will not experience complications related to urinary retention ?Outcome: Progressing ?  ?

## 2022-12-19 DIAGNOSIS — I358 Other nonrheumatic aortic valve disorders: Secondary | ICD-10-CM | POA: Diagnosis not present

## 2022-12-19 LAB — CBC WITH DIFFERENTIAL/PLATELET
Abs Immature Granulocytes: 0.04 10*3/uL (ref 0.00–0.07)
Basophils Absolute: 0.1 10*3/uL (ref 0.0–0.1)
Basophils Relative: 1 %
Eosinophils Absolute: 0.2 10*3/uL (ref 0.0–0.5)
Eosinophils Relative: 2 %
HCT: 30.5 % — ABNORMAL LOW (ref 39.0–52.0)
Hemoglobin: 9.7 g/dL — ABNORMAL LOW (ref 13.0–17.0)
Immature Granulocytes: 0 %
Lymphocytes Relative: 21 %
Lymphs Abs: 2.8 10*3/uL (ref 0.7–4.0)
MCH: 24.3 pg — ABNORMAL LOW (ref 26.0–34.0)
MCHC: 31.8 g/dL (ref 30.0–36.0)
MCV: 76.4 fL — ABNORMAL LOW (ref 80.0–100.0)
Monocytes Absolute: 1.2 10*3/uL — ABNORMAL HIGH (ref 0.1–1.0)
Monocytes Relative: 9 %
Neutro Abs: 8.7 10*3/uL — ABNORMAL HIGH (ref 1.7–7.7)
Neutrophils Relative %: 67 %
Platelets: 410 10*3/uL — ABNORMAL HIGH (ref 150–400)
RBC: 3.99 MIL/uL — ABNORMAL LOW (ref 4.22–5.81)
RDW: 16.1 % — ABNORMAL HIGH (ref 11.5–15.5)
WBC: 13 10*3/uL — ABNORMAL HIGH (ref 4.0–10.5)
nRBC: 0 % (ref 0.0–0.2)

## 2022-12-19 LAB — BASIC METABOLIC PANEL
Anion gap: 6 (ref 5–15)
BUN: 20 mg/dL (ref 8–23)
CO2: 25 mmol/L (ref 22–32)
Calcium: 8.8 mg/dL — ABNORMAL LOW (ref 8.9–10.3)
Chloride: 97 mmol/L — ABNORMAL LOW (ref 98–111)
Creatinine, Ser: 0.93 mg/dL (ref 0.61–1.24)
GFR, Estimated: >60 mL/min (ref >60)
Glucose, Bld: 97 mg/dL (ref 70–99)
Potassium: 4.1 mmol/L (ref 3.5–5.1)
Sodium: 128 mmol/L — ABNORMAL LOW (ref 135–145)

## 2022-12-19 LAB — GLUCOSE, CAPILLARY
Glucose-Capillary: 97 mg/dL (ref 70–99)
Glucose-Capillary: 137 mg/dL — ABNORMAL HIGH (ref 70–99)
Glucose-Capillary: 100 mg/dL — ABNORMAL HIGH (ref 70–99)
Glucose-Capillary: 116 mg/dL — ABNORMAL HIGH (ref 70–99)

## 2022-12-19 MED ORDER — CHLORHEXIDINE GLUCONATE CLOTH 2 % EX PADS
6.0000 | MEDICATED_PAD | Freq: Every day | CUTANEOUS | Status: DC
  Administered 2022-12-19 – 2022-12-25 (×7): 6 via TOPICAL

## 2022-12-19 MED ORDER — SODIUM CHLORIDE 0.9% FLUSH: 10.0000 mL | INTRAVENOUS | Status: DC | PRN

## 2022-12-19 NOTE — Progress Notes (Signed)
PROGRESS NOTE    [name]  NFA:[id number] DOB: 03-01-1951 DOA: 12/11/2022 PCP: [name], MD    Chief Complaint  Patient presents with   Abnormal labs    Brief Narrative:    [name] is a 72 y.o. male with medical history significant of history of TAVR, COPD, mild dementia, GERD, history of CVA, diabetes, hypertension, atrial fibrillation on anticoagulation.  Patient with recent hospitalization secondary to fall, he was called back to ED as his blood cultures growing Streptococcus, workup significant for TAVR endocarditis.  The patient has been evaluated by ID. The recommendation is for eight weeks of ceftriaxone 2 gm IV daily and 2 weeks of IV gentamicin.   On 12/18/2022 the patient was given a 1 L bolus of LR for hypotension 83/52. Will monitor and possibly decrease or discontinue the dose of coreg before evening dose is due.  The patient's blood pressure appears well controlled today after bolus given yesterday.  The patient will need to complete his 2 week course of IV gentamicin as in patient in order to follow his renal status.   Status is: Inpatient  Consultants:  ID Cardiology   Subjective:  No significant events overnight, he denies any complaints.  Objective: Vitals:   12/18/22 2004 12/19/22 0020 12/19/22 0800 12/19/22 1225  BP: (!) 141/74 138/66 109/60 116/62  Pulse: (!) 55 (!) 54 (!) 53 (!) 56  Resp: 20  18 20   Temp: 98.2 F (36.8 C) 98 F (36.7 C) 97.7 F (36.5 C) 97.7 F (36.5 C)  TempSrc:   Oral Oral  SpO2: 99% 98% 99% 98%  Weight:        Intake/Output Summary (Last 24 hours) at 12/19/2022 1545 Last data filed at 12/19/2022 1225 Gross per 24 hour  Intake --  Output 1425 ml  Net -1425 ml   Filed Weights   12/11/22 2100  Weight: 81.6 kg    Examination:  Exam:  Constitutional:  The patient is awake, alert, and oriented x 3. No acute distress. Respiratory:  No increased work of breathing. No wheezes, rales, or  rhonchi No tactile fremitus Cardiovascular:  Regular rate and rhythm No murmurs, ectopy, or gallups. No lateral PMI. No thrills. Abdomen:  Abdomen is soft, non-tender, non-distended No hernias, masses, or organomegaly Normoactive bowel sounds.  Musculoskeletal:  No cyanosis, clubbing, or edema Skin:  No rashes, lesions, ulcers palpation of skin: no induration or nodules Neurologic:  CN 2-12 intact Sensation all 4 extremities intact Psychiatric:  Mental status Mood, affect appropriate Orientation to person, place, time  judgment and insight appear intact  Data Reviewed: I have personally reviewed following labs and imaging studies  CBC    Component Value Date/Time   WBC 13.0 (H) 12/19/2022 0241   RBC 3.99 (L) 12/19/2022 0241   HGB 9.7 (L) 12/19/2022 0241   HGB 14.0 07/06/2019 0900   HCT 30.5 (L) 12/19/2022 0241   HCT 43.3 07/06/2019 0900   PLT 410 (H) 12/19/2022 0241   PLT 283 07/06/2019 0900   MCV 76.4 (L) 12/19/2022 0241   MCV 91 07/06/2019 0900   MCH 24.3 (L) 12/19/2022 0241   MCHC 31.8 12/19/2022 0241   RDW 16.1 (H) 12/19/2022 0241   RDW 13.0 07/06/2019 0900   LYMPHSABS 2.8 12/19/2022 0241   MONOABS 1.2 (H) 12/19/2022 0241   EOSABS 0.2 12/19/2022 0241   BASOSABS 0.1 12/19/2022 0241   Basic Metabolic Panel: Recent Labs  Lab 12/13/22 0302 12/14/22 0549 12/15/22 0153 12/16/22 0558 12/17/22  4098 12/18/22 0748 12/19/22 0241  NA 128*   < > 131* 131* 129* 127* 128*  K 3.9   < > 4.4 4.0 4.7 4.0 4.1  CL 99   < > 100 97* 100 92* 97*  CO2 22   < > 25 23 22 24 25   GLUCOSE 100*   < > 98 84 97 87 97  BUN 11   < > 15 12 16 17 20   CREATININE 0.75   < > 0.94 0.86 0.70 0.86 0.93  CALCIUM 8.5*   < > 8.6* 9.3 8.8* 8.9 8.8*  PHOS 3.3  --   --   --   --   --   --    < > = values in this interval not displayed.    GFR: Estimated Creatinine Clearance: 81.1 mL/min (by C-G formula based on SCr of 0.93 mg/dL).  Liver Function Tests: No results for input(s): "AST",  "ALT", "ALKPHOS", "BILITOT", "PROT", "ALBUMIN" in the last 168 hours.   CBG: Recent Labs  Lab 12/18/22 1135 12/18/22 1532 12/18/22 2129 12/19/22 0757 12/19/22 1223  GLUCAP 123* 104* 109* 97 137*     Recent Results (from the past 240 hour(s))  Culture, blood (Routine x 2)     Status: Abnormal   Collection Time: 12/09/22  8:56 PM   Specimen: BLOOD RIGHT FOREARM  Result Value Ref Range Status   Specimen Description   Final    BLOOD RIGHT FOREARM Performed at [facility], [address], [place], [place] [address]    Special Requests   Final    BOTTLES DRAWN AEROBIC AND ANAEROBIC Blood Culture results may not be optimal due to an excessive volume of blood received in culture bottles Performed at [facility], [address] [place], [place] [address]    Culture  Setup Time   Final    GRAM POSITIVE COCCI IN BOTH AEROBIC AND ANAEROBIC BOTTLES Gram Stain Report Called to,Read Back By and Verified With: [facility] @ 1517 ON 12/10/2022 BY FRATTO,ASHLEY CRITICAL RESULT CALLED TO, READ BACK BY AND VERIFIED WITH: RN Marisa Hua [phone] @2242  FH Performed at [facility], [address], [place], [place] [address]    Culture STREPTOCOCCUS INFANTARIUS (A)  Final   Report Status 12/14/2022 FINAL  Final   Organism ID, Bacteria STREPTOCOCCUS INFANTARIUS  Final      Susceptibility   Streptococcus infantarius - MIC*    ERYTHROMYCIN <=0.12 SENSITIVE Sensitive     TETRACYCLINE <=0.25 SENSITIVE Sensitive     VANCOMYCIN 0.5 SENSITIVE Sensitive     CLINDAMYCIN <=0.25 SENSITIVE Sensitive     PENICILLIN 0.25 INTERMEDIATE Intermediate     CEFTRIAXONE 0.25 SENSITIVE Sensitive     LEVOFLOXACIN 2 SENSITIVE Sensitive     * STREPTOCOCCUS INFANTARIUS  Blood Culture ID Panel (Reflexed)     Status: Abnormal   Collection Time: 12/09/22  8:56 PM  Result Value Ref Range Status   Enterococcus faecalis NOT DETECTED NOT DETECTED Final   Enterococcus Faecium NOT DETECTED NOT DETECTED Final   Listeria  monocytogenes NOT DETECTED NOT DETECTED Final   Staphylococcus species NOT DETECTED NOT DETECTED Final   Staphylococcus aureus (BCID) NOT DETECTED NOT DETECTED Final   Staphylococcus epidermidis NOT DETECTED NOT DETECTED Final   Staphylococcus lugdunensis NOT DETECTED NOT DETECTED Final   Streptococcus species DETECTED (A) NOT DETECTED Final    Comment: Not Enterococcus species, Streptococcus agalactiae, Streptococcus pyogenes, or Streptococcus pneumoniae. CRITICAL RESULT CALLED TO, READ BACK BY AND VERIFIED WITH: RN  C. TURNER [id number] @2242  FH    Streptococcus agalactiae NOT DETECTED NOT DETECTED Final   Streptococcus pneumoniae NOT DETECTED NOT DETECTED Final   Streptococcus pyogenes NOT DETECTED NOT DETECTED Final   A.calcoaceticus-baumannii NOT DETECTED NOT DETECTED Final   Bacteroides fragilis NOT DETECTED NOT DETECTED Final   Enterobacterales NOT DETECTED NOT DETECTED Final   Enterobacter cloacae complex NOT DETECTED NOT DETECTED Final   Escherichia coli NOT DETECTED NOT DETECTED Final   Klebsiella aerogenes NOT DETECTED NOT DETECTED Final   Klebsiella oxytoca NOT DETECTED NOT DETECTED Final   Klebsiella pneumoniae NOT DETECTED NOT DETECTED Final   Proteus species NOT DETECTED NOT DETECTED Final   Salmonella species NOT DETECTED NOT DETECTED Final   Serratia marcescens NOT DETECTED NOT DETECTED Final   Haemophilus influenzae NOT DETECTED NOT DETECTED Final   Neisseria meningitidis NOT DETECTED NOT DETECTED Final   Pseudomonas aeruginosa NOT DETECTED NOT DETECTED Final   Stenotrophomonas maltophilia NOT DETECTED NOT DETECTED Final   Candida albicans NOT DETECTED NOT DETECTED Final   Candida auris NOT DETECTED NOT DETECTED Final   Candida glabrata NOT DETECTED NOT DETECTED Final   Candida krusei NOT DETECTED NOT DETECTED Final   Candida parapsilosis NOT DETECTED NOT DETECTED Final   Candida tropicalis NOT DETECTED NOT DETECTED Final   Cryptococcus neoformans/gattii NOT DETECTED  NOT DETECTED Final    Comment: Performed at [facility], [address] [place], [place] [address]  Culture, blood (Routine x 2)     Status: Abnormal   Collection Time: 12/09/22  9:25 PM   Specimen: BLOOD LEFT FOREARM  Result Value Ref Range Status   Specimen Description   Final    BLOOD LEFT FOREARM Performed at [facility], [address] [place], [place] [address]    Special Requests   Final    BOTTLES DRAWN AEROBIC AND ANAEROBIC Blood Culture adequate volume Performed at [facility], [address] [place], [place] [address]    Culture  Setup Time   Final    GRAM POSITIVE COCCI IN BOTH AEROBIC AND ANAEROBIC BOTTLES Gram Stain Report Called to,Read Back By and Verified With: TURNER, CHARLOTTE @ 1618 ON 12/10/2022 BY Yehuda Savannah Performed at [facility], [address] [place], [place] [address]    Culture (A)  Final    STREPTOCOCCUS INFANTARIUS SUSCEPTIBILITIES PERFORMED ON PREVIOUS CULTURE WITHIN THE LAST 5 DAYS. Performed at [facility], [address], [place], [place] [address]    Report Status 12/12/2022 FINAL  Final  Resp panel by RT-PCR (RSV, Flu A&B, Covid) Anterior Nasal Swab     Status: None   Collection Time: 12/09/22 10:43 PM   Specimen: Anterior Nasal Swab  Result Value Ref Range Status   SARS Coronavirus 2 by RT PCR NEGATIVE NEGATIVE Final    Comment: (NOTE) SARS-CoV-2 target nucleic acids are NOT DETECTED.  The SARS-CoV-2 RNA is generally detectable in upper respiratory specimens during the acute phase of infection. The lowest concentration of SARS-CoV-2 viral copies this assay can detect is 138 copies/mL. A negative result does not preclude SARS-Cov-2 infection and should not be used as the sole basis for treatment or other patient management decisions. A negative result may occur with  improper specimen collection/handling, submission of specimen other than nasopharyngeal swab, presence of viral mutation(s) within the areas  targeted by this assay, and inadequate number of viral copies(<138 copies/mL). A negative result must be combined with clinical observations, patient history, and epidemiological information. The expected  result is Negative.  Fact Sheet for Patients:  BloggerCourse.com  Fact Sheet for Healthcare Providers:  SeriousBroker.it  This test is no t yet approved or cleared by the [place] FDA and  has been authorized for detection and/or diagnosis of SARS-CoV-2 by FDA under an Emergency Use Authorization (EUA). This EUA will remain  in effect (meaning this test can be used) for the duration of the COVID-19 declaration under Section 564(b)(1) of the Act, 21 U.S.C.section 360bbb-3(b)(1), unless the authorization is terminated  or revoked sooner.       Influenza A by PCR NEGATIVE NEGATIVE Final   Influenza B by PCR NEGATIVE NEGATIVE Final    Comment: (NOTE) The Xpert Xpress SARS-CoV-2/FLU/RSV plus assay is intended as an aid in the diagnosis of influenza from Nasopharyngeal swab specimens and should not be used as a sole basis for treatment. Nasal washings and aspirates are unacceptable for Xpert Xpress SARS-CoV-2/FLU/RSV testing.  Fact Sheet for Patients: BloggerCourse.com  Fact Sheet for Healthcare Providers: SeriousBroker.it  This test is not yet approved or cleared by the [place] FDA and has been authorized for detection and/or diagnosis of SARS-CoV-2 by FDA under an Emergency Use Authorization (EUA). This EUA will remain in effect (meaning this test can be used) for the duration of the COVID-19 declaration under Section 564(b)(1) of the Act, 21 U.S.C. section 360bbb-3(b)(1), unless the authorization is terminated or revoked.     Resp Syncytial Virus by PCR NEGATIVE NEGATIVE Final    Comment: (NOTE) Fact Sheet for  Patients: BloggerCourse.com  Fact Sheet for Healthcare Providers: SeriousBroker.it  This test is not yet approved or cleared by the [place] FDA and has been authorized for detection and/or diagnosis of SARS-CoV-2 by FDA under an Emergency Use Authorization (EUA). This EUA will remain in effect (meaning this test can be used) for the duration of the COVID-19 declaration under Section 564(b)(1) of the Act, 21 U.S.C. section 360bbb-3(b)(1), unless the authorization is terminated or revoked.  Performed at [facility], [address], [place], [place] [address]   Culture, blood (routine x 2)     Status: None   Collection Time: 12/11/22  4:49 PM   Specimen: Left Antecubital; Blood  Result Value Ref Range Status   Specimen Description LEFT ANTECUBITAL  Final   Special Requests   Final    BOTTLES DRAWN AEROBIC AND ANAEROBIC Blood Culture adequate volume   Culture   Final    NO GROWTH 5 DAYS Performed at [facility], [address], [place], [place] [address]    Report Status 12/16/2022 FINAL  Final  Culture, blood (routine x 2)     Status: None   Collection Time: 12/11/22  4:49 PM   Specimen: Right Antecubital; Blood  Result Value Ref Range Status   Specimen Description RIGHT ANTECUBITAL  Final   Special Requests   Final    BOTTLES DRAWN AEROBIC AND ANAEROBIC Blood Culture adequate volume   Culture   Final    NO GROWTH 5 DAYS Performed at [facility], [address], [place], [place] [address]    Report Status 12/16/2022 FINAL  Final    Scheduled Meds:  apixaban  5 mg Oral BID   carvedilol  25 mg Oral BID WC   Chlorhexidine Gluconate Cloth  6 each Topical Daily   feeding supplement (GLUCERNA SHAKE)  237 mL Oral TID BM   fluticasone furoate-vilanterol  1 puff Inhalation Daily   insulin aspart  0-5 Units Subcutaneous QHS   insulin aspart  0-9 Units Subcutaneous TID WC   levothyroxine  25 mcg Oral Daily   losartan  50 mg  Oral Daily   rosuvastatin  20 mg Oral Daily   senna-docusate  2 tablet Oral BID   sertraline  150 mg Oral Daily   spironolactone  12.5 mg Oral Daily   Continuous Infusions:  cefTRIAXone (ROCEPHIN)  IV 2 g (12/18/22 1725)   gentamicin 180 mg (12/19/22 0939)    Assessment & Plan:  Principal Problem:   Aortic valve endocarditis Active Problems:   Diabetes mellitus type 2, controlled, with complications (HCC)   Hypertensive heart disease with chronic diastolic congestive heart failure (HCC)   GERD   COPD mixed type (HCC)   S/P TAVR (transcatheter aortic valve replacement)   Essential hypertension   Acquired hypothyroidism   Streptococcus bacteremia, infected endocarditis of the TAVR valve -Follow on repeat blood cultures, surveillance cultures as admission remains negative -2D echo is significant of mobile mass and mitral valve appears to be attached to aortic prosthesis and some few, the patient went for TEE on 7/15, which was significant for Infective endocarditis of the TAVR valve was noted with a small mobile vegetation in the LVOT - there is at least mild prosthetic valve stenosis and no AI.  -Cardiology consult greatly appreciated, plan for outpatient follow-up and repeat echo after antibiotics is finished -Antibiotics : Management per ID. The recommendation is for eight weeks of ceftriaxone 2 gm IV daily and 2 weeks of IV gentamicin. Renal function will need to be monitored while he completes his gentamicin. -Surveillance blood cultures are negative. -PICC line to be placed on 12/18/2022.   Falls -Continue with PT/OT  Hypertension - Continue blood pressure medications  History of TAVR  COPD - Continue long-acting inhalers  Essential hypertension - Continue Coreg, losartan   Mixed hyperlipidemia - Continue Crestor   Persistent atrial fibrillation - Continue Coreg, Eliquis. May need to decrease dose of discontinue Coreg due to hypotension. Monitor.   Acquired  hypothyroidism - Continue Synthroid  Hyponatremia - improving     DVT prophylaxis: Eliquis Code Status: Full Family Communication: None at bedside Disposition: Probably home with home health services.  LOS: 8 days     Kimm Ungaro Gerri Lins, MD [facility]   To contact the attending provider between 7A-7P or the covering provider during after hours 7P-7A, please log into the web site [email or web address] and access using universal [place] password for that web site. If you do not have the password, please call the hospital operator.  12/19/2022, 3:45 PM

## 2022-12-19 NOTE — Plan of Care (Signed)

## 2022-12-19 NOTE — Progress Notes (Signed)
Peripherally Inserted Central Catheter Placement  The IV Nurse has discussed with the patient and/or persons authorized to consent for the patient, the purpose of this procedure and the potential benefits and risks involved with this procedure.  The benefits include less needle sticks, lab draws from the catheter, and the patient may be discharged home with the catheter. Risks include, but not limited to, infection, bleeding, blood clot (thrombus formation), and puncture of an artery; nerve damage and irregular heartbeat and possibility to perform a PICC exchange if needed/ordered by physician.  Alternatives to this procedure were also discussed.  Bard Power PICC patient education guide, fact sheet on infection prevention and patient information card has been provided to patient /or left at bedside.    PICC Placement Documentation  PICC Single Lumen 12/19/22 Right Brachial 42 cm 0 cm (Active)  Indication for Insertion or Continuance of Line Home intravenous therapies (PICC only) 12/19/22 0852  Exposed Catheter (cm) 0 cm 12/19/22 0852  Site Assessment Clean, Dry, Intact 12/19/22 0852  Line Status Saline locked;Flushed;Blood return noted 12/19/22 0852  Dressing Type Transparent;Securing device 12/19/22 0852  Dressing Status Antimicrobial disc in place;Clean, Dry, Intact 12/19/22 0852  Safety Lock Not Applicable 12/19/22 0852  Line Care Connections checked and tightened 12/19/22 0852  Line Adjustment (NICU/IV Team Only) No 12/19/22 0852  Dressing Intervention New dressing 12/19/22 0852  Dressing Change Due 12/26/22 12/19/22 0852       Burnard Bunting Chenice 12/19/2022, 8:54 AM

## 2022-12-20 DIAGNOSIS — I358 Other nonrheumatic aortic valve disorders: Secondary | ICD-10-CM | POA: Diagnosis not present

## 2022-12-20 LAB — CBC WITH DIFFERENTIAL/PLATELET
Abs Immature Granulocytes: 0.06 10*3/uL (ref 0.00–0.07)
Basophils Absolute: 0.1 10*3/uL (ref 0.0–0.1)
Basophils Relative: 1 %
Eosinophils Absolute: 0.2 10*3/uL (ref 0.0–0.5)
Eosinophils Relative: 2 %
HCT: 32.2 % — ABNORMAL LOW (ref 39.0–52.0)
Hemoglobin: 9.9 g/dL — ABNORMAL LOW (ref 13.0–17.0)
Immature Granulocytes: 0 %
Lymphocytes Relative: 20 %
Lymphs Abs: 2.8 10*3/uL (ref 0.7–4.0)
MCH: 23.9 pg — ABNORMAL LOW (ref 26.0–34.0)
MCHC: 30.7 g/dL (ref 30.0–36.0)
MCV: 77.8 fL — ABNORMAL LOW (ref 80.0–100.0)
Monocytes Absolute: 1.2 10*3/uL — ABNORMAL HIGH (ref 0.1–1.0)
Monocytes Relative: 9 %
Neutro Abs: 9.2 10*3/uL — ABNORMAL HIGH (ref 1.7–7.7)
Neutrophils Relative %: 68 %
Platelets: 438 10*3/uL — ABNORMAL HIGH (ref 150–400)
RBC: 4.14 MIL/uL — ABNORMAL LOW (ref 4.22–5.81)
RDW: 16.1 % — ABNORMAL HIGH (ref 11.5–15.5)
WBC: 13.5 10*3/uL — ABNORMAL HIGH (ref 4.0–10.5)
nRBC: 0 % (ref 0.0–0.2)

## 2022-12-20 LAB — GLUCOSE, CAPILLARY
Glucose-Capillary: 98 mg/dL (ref 70–99)
Glucose-Capillary: 151 mg/dL — ABNORMAL HIGH (ref 70–99)
Glucose-Capillary: 138 mg/dL — ABNORMAL HIGH (ref 70–99)
Glucose-Capillary: 124 mg/dL — ABNORMAL HIGH (ref 70–99)

## 2022-12-20 LAB — BASIC METABOLIC PANEL
Anion gap: 7 (ref 5–15)
BUN: 24 mg/dL — ABNORMAL HIGH (ref 8–23)
CO2: 24 mmol/L (ref 22–32)
Calcium: 8.8 mg/dL — ABNORMAL LOW (ref 8.9–10.3)
Chloride: 97 mmol/L — ABNORMAL LOW (ref 98–111)
Creatinine, Ser: 1.01 mg/dL (ref 0.61–1.24)
GFR, Estimated: >60 mL/min (ref >60)
Glucose, Bld: 92 mg/dL (ref 70–99)
Potassium: 4 mmol/L (ref 3.5–5.1)
Sodium: 128 mmol/L — ABNORMAL LOW (ref 135–145)

## 2022-12-20 MED ORDER — POLYETHYLENE GLYCOL 3350 17 G PO PACK
17.0000 g | PACK | Freq: Every day | ORAL | Status: DC
  Administered 2022-12-20 – 2022-12-24 (×4): 17 g via ORAL
  Filled 2022-12-20 (×6): qty 1

## 2022-12-20 NOTE — Progress Notes (Signed)
PROGRESS NOTE    [name]  ZOX:[id number] DOB: [birth year] DOA: 12/11/2022 PCP: [name], MD    Chief Complaint  Patient presents with   Abnormal labs    Brief Narrative:    [name] is a 72 y.o. male with medical history significant of history of TAVR, COPD, mild dementia, GERD, history of CVA, diabetes, hypertension, atrial fibrillation on anticoagulation.  Patient with recent hospitalization secondary to fall, he was called back to ED as his blood cultures growing Streptococcus, workup significant for TAVR endocarditis.  The patient has been evaluated by ID. The recommendation is for eight weeks of ceftriaxone 2 gm IV daily and 2 weeks of IV gentamicin.   On 12/18/2022 the patient was given a 1 L bolus of LR for hypotension 83/52. Will monitor and possibly decrease or discontinue the dose of coreg before evening dose is due.  The patient's blood pressure appears well controlled today after bolus given yesterday.  The patient will need to complete his 2 week course of IV gentamicin as in patient in order to follow his renal status.   Status is: Inpatient  Consultants:  ID Cardiology   Subjective:  No significant events overnight. No new complaints.d  Objective: Vitals:   12/20/22 0000 12/20/22 0400 12/20/22 0734 12/20/22 1200  BP: (!) 144/73 115/80 126/68 129/74  Pulse: (!) 53 (!) 50 (!) 52 (!) 59  Resp: 16 14 12 13   Temp:   98 F (36.7 C) 98.2 F (36.8 C)  TempSrc:   Oral Oral  SpO2: 96% 98% 99% 98%  Weight:        Intake/Output Summary (Last 24 hours) at 12/20/2022 1521 Last data filed at 12/20/2022 0700 Gross per 24 hour  Intake --  Output 1550 ml  Net -1550 ml   Filed Weights   12/11/22 2100  Weight: 81.6 kg    Examination:  Exam:  Constitutional:  The patient is awake, alert, and oriented x 3. No acute distress. Respiratory:  No increased work of breathing. No wheezes, rales, or rhonchi No tactile fremitus Cardiovascular:   Regular rate and rhythm No murmurs, ectopy, or gallups. No lateral PMI. No thrills. Abdomen:  Abdomen is soft, non-tender, non-distended No hernias, masses, or organomegaly Normoactive bowel sounds.  Musculoskeletal:  No cyanosis, clubbing, or edema Skin:  No rashes, lesions, ulcers palpation of skin: no induration or nodules Neurologic:  CN 2-12 intact Sensation all 4 extremities intact Psychiatric:  Mental status Mood, affect appropriate Orientation to person, place, time  judgment and insight appear intact  Data Reviewed: I have personally reviewed following labs and imaging studies  CBC    Component Value Date/Time   WBC 13.5 (H) 12/20/2022 0251   RBC 4.14 (L) 12/20/2022 0251   HGB 9.9 (L) 12/20/2022 0251   HGB 14.0 07/06/2019 0900   HCT 32.2 (L) 12/20/2022 0251   HCT 43.3 07/06/2019 0900   PLT 438 (H) 12/20/2022 0251   PLT 283 07/06/2019 0900   MCV 77.8 (L) 12/20/2022 0251   MCV 91 07/06/2019 0900   MCH 23.9 (L) 12/20/2022 0251   MCHC 30.7 12/20/2022 0251   RDW 16.1 (H) 12/20/2022 0251   RDW 13.0 07/06/2019 0900   LYMPHSABS 2.8 12/20/2022 0251   MONOABS 1.2 (H) 12/20/2022 0251   EOSABS 0.2 12/20/2022 0251   BASOSABS 0.1 12/20/2022 0251   Basic Metabolic Panel: Recent Labs  Lab 12/16/22 0558 12/17/22 0336 12/18/22 0748 12/19/22 0241 12/20/22 0251  NA 131* 129* 127* 128*  128*  K 4.0 4.7 4.0 4.1 4.0  CL 97* 100 92* 97* 97*  CO2 23 22 24 25 24   GLUCOSE 84 97 87 97 92  BUN 12 16 17 20  24*  CREATININE 0.86 0.70 0.86 0.93 1.01  CALCIUM 9.3 8.8* 8.9 8.8* 8.8*    GFR: Estimated Creatinine Clearance: 74.7 mL/min (by C-G formula based on SCr of 1.01 mg/dL).  Liver Function Tests: No results for input(s): "AST", "ALT", "ALKPHOS", "BILITOT", "PROT", "ALBUMIN" in the last 168 hours.   CBG: Recent Labs  Lab 12/19/22 1223 12/19/22 1620 12/19/22 2227 12/20/22 0734 12/20/22 1200  GLUCAP 137* 100* 116* 98 151*     Recent Results (from the past 240  hour(s))  Culture, blood (routine x 2)     Status: None   Collection Time: 12/11/22  4:49 PM   Specimen: Left Antecubital; Blood  Result Value Ref Range Status   Specimen Description LEFT ANTECUBITAL  Final   Special Requests   Final    BOTTLES DRAWN AEROBIC AND ANAEROBIC Blood Culture adequate volume   Culture   Final    NO GROWTH 5 DAYS Performed at [facility], [address] [place], [place] [address]    Report Status 12/16/2022 FINAL  Final  Culture, blood (routine x 2)     Status: None   Collection Time: 12/11/22  4:49 PM   Specimen: Right Antecubital; Blood  Result Value Ref Range Status   Specimen Description RIGHT ANTECUBITAL  Final   Special Requests   Final    BOTTLES DRAWN AEROBIC AND ANAEROBIC Blood Culture adequate volume   Culture   Final    NO GROWTH 5 DAYS Performed at [facility], [address], [place], [place] [address]    Report Status 12/16/2022 FINAL  Final    Scheduled Meds:  apixaban  5 mg Oral BID   carvedilol  25 mg Oral BID WC   Chlorhexidine Gluconate Cloth  6 each Topical Daily   feeding supplement (GLUCERNA SHAKE)  237 mL Oral TID BM   fluticasone furoate-vilanterol  1 puff Inhalation Daily   insulin aspart  0-5 Units Subcutaneous QHS   insulin aspart  0-9 Units Subcutaneous TID WC   levothyroxine  25 mcg Oral Daily   losartan  50 mg Oral Daily   polyethylene glycol  17 g Oral Daily   rosuvastatin  20 mg Oral Daily   senna-docusate  2 tablet Oral BID   sertraline  150 mg Oral Daily   spironolactone  12.5 mg Oral Daily   Continuous Infusions:  cefTRIAXone (ROCEPHIN)  IV 2 g (12/19/22 1654)   gentamicin 180 mg (12/20/22 0926)    Assessment & Plan:  Principal Problem:   Aortic valve endocarditis Active Problems:   Diabetes mellitus type 2, controlled, with complications (HCC)   Hypertensive heart disease with chronic diastolic congestive heart failure (HCC)   GERD   COPD mixed type (HCC)   S/P TAVR (transcatheter aortic valve  replacement)   Essential hypertension   Acquired hypothyroidism   Streptococcus bacteremia, infected endocarditis of the TAVR valve -Follow on repeat blood cultures, surveillance cultures as admission remains negative -2D echo is significant of mobile mass and mitral valve appears to be attached to aortic prosthesis and some few, the patient went for TEE on 7/15, which was significant for Infective endocarditis of the TAVR valve was noted with a small mobile vegetation in the LVOT - there is at least mild prosthetic valve stenosis and no AI.  -  Cardiology consult greatly appreciated, plan for outpatient follow-up and repeat echo after antibiotics is finished -Antibiotics : Management per ID. The recommendation is for eight weeks of ceftriaxone 2 gm IV daily and 2 weeks of IV gentamicin. Renal function will need to be monitored while he completes his gentamicin. -Surveillance blood cultures are negative. -PICC line to be placed on 12/18/2022.   Falls -Continue with PT/OT  Hypertension - Continue blood pressure medications  History of TAVR  COPD - Continue long-acting inhalers  Essential hypertension - Continue Coreg, losartan   Mixed hyperlipidemia - Continue Crestor   Persistent atrial fibrillation - Continue Coreg, Eliquis. May need to decrease dose of discontinue Coreg due to hypotension. Monitor.   Acquired hypothyroidism - Continue Synthroid  Hyponatremia - improving     DVT prophylaxis: Eliquis Code Status: Full Family Communication: None at bedside Disposition: Probably home with home health services.  LOS: 9 days     Geoff Dacanay, MD [facility]   To contact the attending provider between 7A-7P or the covering provider during after hours 7P-7A, please log into the web site [email or web address] and access using universal [place] password for that web site. If you do not have the password, please call the hospital operator.  12/20/2022, 3:21 PM

## 2022-12-20 NOTE — Plan of Care (Signed)

## 2022-12-21 DIAGNOSIS — Z79899 Other long term (current) drug therapy: Secondary | ICD-10-CM | POA: Diagnosis not present

## 2022-12-21 DIAGNOSIS — Z452 Encounter for adjustment and management of vascular access device: Secondary | ICD-10-CM

## 2022-12-21 DIAGNOSIS — I330 Acute and subacute infective endocarditis: Secondary | ICD-10-CM | POA: Diagnosis not present

## 2022-12-21 DIAGNOSIS — I358 Other nonrheumatic aortic valve disorders: Secondary | ICD-10-CM | POA: Diagnosis not present

## 2022-12-21 LAB — GLUCOSE, CAPILLARY
Glucose-Capillary: 108 mg/dL — ABNORMAL HIGH (ref 70–99)
Glucose-Capillary: 116 mg/dL — ABNORMAL HIGH (ref 70–99)
Glucose-Capillary: 109 mg/dL — ABNORMAL HIGH (ref 70–99)
Glucose-Capillary: 102 mg/dL — ABNORMAL HIGH (ref 70–99)

## 2022-12-21 LAB — CBC WITH DIFFERENTIAL/PLATELET
Abs Immature Granulocytes: 0.04 10*3/uL (ref 0.00–0.07)
Basophils Absolute: 0.1 10*3/uL (ref 0.0–0.1)
Basophils Relative: 1 %
Eosinophils Absolute: 0.2 10*3/uL (ref 0.0–0.5)
Eosinophils Relative: 2 %
HCT: 30.5 % — ABNORMAL LOW (ref 39.0–52.0)
Hemoglobin: 9.8 g/dL — ABNORMAL LOW (ref 13.0–17.0)
Immature Granulocytes: 0 %
Lymphocytes Relative: 22 %
Lymphs Abs: 2.4 10*3/uL (ref 0.7–4.0)
MCH: 25 pg — ABNORMAL LOW (ref 26.0–34.0)
MCHC: 32.1 g/dL (ref 30.0–36.0)
MCV: 77.8 fL — ABNORMAL LOW (ref 80.0–100.0)
Monocytes Absolute: 1 10*3/uL (ref 0.1–1.0)
Monocytes Relative: 9 %
Neutro Abs: 7 10*3/uL (ref 1.7–7.7)
Neutrophils Relative %: 66 %
Platelets: 407 10*3/uL — ABNORMAL HIGH (ref 150–400)
RBC: 3.92 MIL/uL — ABNORMAL LOW (ref 4.22–5.81)
RDW: 16.1 % — ABNORMAL HIGH (ref 11.5–15.5)
WBC: 10.8 10*3/uL — ABNORMAL HIGH (ref 4.0–10.5)
nRBC: 0 % (ref 0.0–0.2)

## 2022-12-21 LAB — BASIC METABOLIC PANEL
Anion gap: 8 (ref 5–15)
BUN: 27 mg/dL — ABNORMAL HIGH (ref 8–23)
CO2: 24 mmol/L (ref 22–32)
Calcium: 8.7 mg/dL — ABNORMAL LOW (ref 8.9–10.3)
Chloride: 96 mmol/L — ABNORMAL LOW (ref 98–111)
Creatinine, Ser: 1.09 mg/dL (ref 0.61–1.24)
GFR, Estimated: >60 mL/min (ref >60)
Glucose, Bld: 132 mg/dL — ABNORMAL HIGH (ref 70–99)
Potassium: 4 mmol/L (ref 3.5–5.1)
Sodium: 128 mmol/L — ABNORMAL LOW (ref 135–145)

## 2022-12-21 LAB — GENTAMICIN LEVEL, TROUGH: Gentamicin Trough: <0.5 ug/mL — ABNORMAL LOW (ref 0.5–2.0)

## 2022-12-21 NOTE — Progress Notes (Signed)
Pharmacy Antibiotic Note  [name] is a 72 y.o. male s/p TAVR admitted on 12/11/2022 with Strep infantarius bacteremia. 7/15 TEE confirmed aortic valve endocarditis. Currently on ceftriaxone and gentamicin. Penicillin susceptibility to S. Infantarius is intermediate (MIC = 0.25); added gentamicin for synergy. Renal function has declined with Scr 0.7 > 1.09, CrCl 69.2 ml/min.   7/22: Gentamicin trough today is <0.5. Goal trough < 1.0. Pharmacy has been consulted for gentamicin dosing.  Plan: Continue gentamicin dose of 180 mg (2.25 mg/kg IBW) IV Q24H. Plan to finish gentamicin on Friday 7/26 and continue ceftriaxone. Monitor renal function.  Weight: 81.6 kg (179 lb 14.3 oz)  Temp (24hrs), Avg:98.2 F (36.8 C), Min:98 F (36.7 C), Max:98.5 F (36.9 C)  Recent Labs  Lab 12/16/22 0558 12/17/22 0336 12/17/22 0959 12/18/22 0748 12/19/22 0241 12/20/22 0251 12/21/22 0255 12/21/22 0900  WBC 14.1* 14.6*  --   --  13.0* 13.5* 10.8*  --   CREATININE 0.86 0.70  --  0.86 0.93 1.01 1.09  --   GENTTROUGH  --   --  1.3  --   --   --   --  <0.5*    Estimated Creatinine Clearance: 69.2 mL/min (by C-G formula based on SCr of 1.09 mg/dL).    Allergies  Allergen Reactions   Bee Venom Anaphylaxis   Bystolic [Nebivolol Hcl] Other (See Comments)    Nightmares, flashbacks (PTSD)   Codeine Shortness Of Breath and Rash   Doxycycline Shortness Of Breath and Rash   Iodinated Contrast Media Shortness Of Breath and Anaphylaxis    Lost consciousness/difficulty breathing Omni - Paque Contrast    Iohexol Hives, Shortness Of Breath and Rash    Neck and Torso erythemia IVP-DYE >> SHORTNESS OF BREATH    Levaquin [Levofloxacin] Hives and Shortness Of Breath   Oxycodone Hcl Shortness Of Breath, Swelling and Rash   Stadol [Butorphanol] Shortness Of Breath   Succinylcholine Chloride Shortness Of Breath, Nausea Only, Rash and Other (See Comments)    Difficulty breathing    Tamiflu [Oseltamivir  Phosphate]     UNSPECIFIED REACTION    Atorvastatin     myalgias   Synvisc [Hylan G-F 20] Hives and Rash   Gemfibrozil     Other reaction(s): Serum cholesterol raised   Latex Other (See Comments)    blister   Lisinopril     Other reaction(s): Cough   Omeprazole    Dilaudid [Hydromorphone Hcl] Nausea Only and Rash    Aggressive    Methocarbamol Nausea Only and Rash   Niacin Rash   Pentazocine Lactate Rash    rash    Antimicrobials this admission: Ceftriaxone 7/12 >> Gentamicin 7/14 >>   Microbiology results: 7/10 BCx: Strep infantarius 7/12 BCx: NG x 5 days   Thank you for allowing pharmacy to be a part of this patient's care.  Lendon Ka, PharmD Candidate  12/21/2022 1:45 PM

## 2022-12-21 NOTE — Progress Notes (Signed)
PHARMACY CONSULT NOTE FOR:  OUTPATIENT  PARENTERAL ANTIBIOTIC THERAPY (OPAT)  Indication: Strep infantarius TAVR-associated endocarditis  Regimen: Ceftriaxone 2g IV Q24H End date: 01/21/2023  IV antibiotic discharge orders are pended. To discharging provider:  please sign these orders via discharge navigator,  Select New Orders & click on the button choice - Manage This Unsigned Work.     Thank you for allowing pharmacy to be a part of this patient's care.  Lendon Ka, PharmD Candidate  12/21/2022, 1:49 PM

## 2022-12-21 NOTE — TOC Progression Note (Signed)
Transition of Care [facility]) - Progression Note    Patient Details  Name: [name] MRN: [id number] Date of Birth: 13-Dec-1950  Transition of [facility]) CM/SW Contact  Ronny Bacon, RN Phone Number: 12/21/2022, 1:44 PM  Clinical Narrative:   Patient will remain inpatient for IV antibiotic, Gentamicin, and monitoring of renal function. Spoke with Crissie Figures who is aware of the plan of care and will inform [place]ada.    Expected Discharge Plan: Home w Home Health Services Barriers to Discharge: Continued Medical Work up  Expected Discharge Plan and Services In-house Referral: NA Discharge Planning Services: CM Consult, Mobile Meals, Other - See comment (coordinating with the [place] Kathryne Sharper) for resources in the home) Post Acute Care Choice: Home Health Living arrangements for the past 2 months: Single Family Home                 DME Arranged: IV pump/equipment DME Agency: Other - Comment Julianne Rice) Date DME Agency Contacted: 12/14/22 Time DME Agency Contacted: [phone] Representative spoke with at DME Agency: Jeri Modena HH Arranged: RN, IV Antibiotics HH Agency: [facility] Date [facility] Agency Contacted: 12/14/22 Time HH Agency Contacted: 1615 Representative spoke with at [facility] Agency: Kandee Keen   Social Determinants of Health (SDOH) Interventions SDOH Screenings   Food Insecurity: No Food Insecurity (12/12/2022)  Housing: Low Risk  (12/12/2022)  Transportation Needs: No Transportation Needs (12/12/2022)  Utilities: Not At Risk (12/12/2022)  Tobacco Use: Medium Risk (12/14/2022)    Readmission Risk Interventions     No data to display

## 2022-12-21 NOTE — TOC Progression Note (Signed)
Transition of Care [facility]) - Progression Note    Patient Details  Name: [name] MRN: [id number] Date of Birth: 1951-05-16  Transition of Care [facility]) CM/SW Contact  Mearl Latin, LCSW Phone Number: 12/21/2022, 12:56 PM  Clinical Narrative:    CSW received call from the [place]. CSW provided update.    Expected Discharge Plan: Home w Home Health Services Barriers to Discharge: Continued Medical Work up  Expected Discharge Plan and Services In-house Referral: NA Discharge Planning Services: CM Consult, Mobile Meals, Other - See comment (coordinating with the [place] Kathryne Sharper) for resources in the home) Post Acute Care Choice: Home Health Living arrangements for the past 2 months: Single Family Home                 DME Arranged: IV pump/equipment DME Agency: Other - Comment Julianne Rice) Date DME Agency Contacted: 12/14/22 Time DME Agency Contacted: [phone] Representative spoke with at DME Agency: Jeri Modena HH Arranged: RN, IV Antibiotics HH Agency: [facility] Date [facility] Agency Contacted: 12/14/22 Time HH Agency Contacted: 1615 Representative spoke with at [facility] Agency: Kandee Keen   Social Determinants of Health (SDOH) Interventions SDOH Screenings   Food Insecurity: No Food Insecurity (12/12/2022)  Housing: Low Risk  (12/12/2022)  Transportation Needs: No Transportation Needs (12/12/2022)  Utilities: Not At Risk (12/12/2022)  Tobacco Use: Medium Risk (12/14/2022)    Readmission Risk Interventions     No data to display

## 2022-12-21 NOTE — Progress Notes (Signed)
Physical Therapy Treatment Patient Details Name: [name] MRN: [id number] DOB: 07-Jul-1950 Today's Date: 12/21/2022   History of Present Illness Pt is a 72 y.o. male admitted s/p fall and recent + blood cultures for  Streptococcus infantarius. 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 +PICC line for long-term IV antibiotics. PMH significant for TAVR, COPD, mild dementia, GERD, history of CVA, diabetes, hypertension, atrial fibrillation on anticoagulation.  Patient with recent admission due to fever. He also relates a history of falling.  He falls once twice a week where he loses his balance backwards because he feels lightheaded. This has been going on for the past few months.  He has been referred to cardiology and neurology for evaluation of his falls.    PT Comments  Patient eager to participate, however once standing he felt too lightheaded to attempt ambulation and assisted to chair with RW and min assist. Patient likely orthostatic as BP seated prior to transfer 110/69 and seated after transfer 95/64. Patient also with peripheral vestibulopathy as he has positive head thrust and significant difficulty with VORx1 exercise which recreates the "off-balance" feeling he gets when walking and prior to his falling. Will incr freq and try to see pt as much as possible to prepare for discharge home.      Assistance Recommended at Discharge Frequent or constant Supervision/Assistance  If plan is discharge home, recommend the following:  Can travel by private vehicle    A little help with walking and/or transfers;A little help with bathing/dressing/bathroom;Assistance with cooking/housework;Assist for transportation;Help with stairs or ramp for entrance      Equipment Recommendations  None recommended by PT    Recommendations for Other Services       Precautions / Restrictions Precautions Precautions: Fall Precaution Comments: denies dizziness  when he falls, "just get off balance and fall backwards" Restrictions Weight Bearing Restrictions: No     Mobility  Bed Mobility Overal bed mobility: Needs Assistance Bed Mobility: Supine to Sit     Supine to sit: Min assist, HOB elevated     General bed mobility comments: moves slowly, used railing, assist to raise torso    Transfers Overall transfer level: Needs assistance Equipment used: Rolling walker (2 wheels) Transfers: Sit to/from Stand, Bed to chair/wheelchair/BSC Sit to Stand: Min assist   Step pivot transfers: Min assist       General transfer comment: pt required initial assist for anterior wt-shift after unsuccessful attempt x 1 without assist    Ambulation/Gait               General Gait Details: pt reports he feels too off-balance and dizzy to ambulate; +drop in BP   Stairs             Wheelchair Mobility     Tilt Bed    Modified Rankin (Stroke Patients Only)       Balance Overall balance assessment: Needs assistance Sitting-balance support: Feet supported, No upper extremity supported Sitting balance-Leahy Scale: Good     Standing balance support: Bilateral upper extremity supported, Reliant on assistive device for balance Standing balance-Leahy Scale: Poor                              Cognition Arousal/Alertness: Awake/alert Behavior During Therapy: WFL for tasks assessed/performed Overall Cognitive Status: Within Functional Limits for tasks assessed  General Comments: some delayed processing with instructions for VORx1 exercise        Exercises General Exercises - Lower Extremity Ankle Circles/Pumps: AROM, Both, 10 reps Heel Slides: AROM, Both, 10 reps Hip ABduction/ADduction: AROM, Both, 10 reps, Supine Other Exercises Other Exercises: bridging x 5 reps Other Exercises: Pt asked to demonstrate exercise he says he has been practicing. Pt demonstrated smooth  pursuits with pt moving target left and right with eyes following target (no saccades noted this date); reviewed VORx1 horizontal head turns with pt only able to keep eyes on target and in focus with slow head turns and creates +imbalance feeling that he states he feels when he loses his balance; provided printed handout with reps, sets, etc.    General Comments General comments (skin integrity, edema, etc.): BP sitting 110/69; seated after transfer/standing 96/64      Pertinent Vitals/Pain Pain Assessment Pain Assessment: Faces Faces Pain Scale: Hurts little more Pain Location: back Pain Descriptors / Indicators: Aching Pain Intervention(s): Limited activity within patient's tolerance, Monitored during session, Repositioned    Home Living                          Prior Function            PT Goals (current goals can now be found in the care plan section) Acute Rehab PT Goals Patient Stated Goal: figure out why I fall PT Goal Formulation: With patient Time For Goal Achievement: 12/26/22 Potential to Achieve Goals: Good Progress towards PT goals: Not progressing toward goals - comment (hypotension limiting activity tolerance)    Frequency    Min 3X/week      PT Plan Frequency needs to be updated    Co-evaluation              AM-PAC PT "6 Clicks" Mobility   Outcome Measure  Help needed turning from your back to your side while in a flat bed without using bedrails?: A Little Help needed moving from lying on your back to sitting on the side of a flat bed without using bedrails?: A Little Help needed moving to and from a bed to a chair (including a wheelchair)?: A Little Help needed standing up from a chair using your arms (e.g., wheelchair or bedside chair)?: A Little Help needed to walk in hospital room?: Total Help needed climbing 3-5 steps with a railing? : Total 6 Click Score: 14    End of Session Equipment Utilized During Treatment: Gait  belt Activity Tolerance: Patient limited by fatigue;Patient limited by lethargy;Treatment limited secondary to medical complications (Comment) (dizziness) Patient left: in chair;with chair alarm set;with call bell/phone within reach   PT Visit Diagnosis: Repeated falls (R29.6);Other abnormalities of gait and mobility (R26.89);History of falling (Z91.81);Unsteadiness on feet (R26.81)     Time: 4098-1191 PT Time Calculation (min) (ACUTE ONLY): 25 min  Charges:    $Therapeutic Exercise: 8-22 mins $Therapeutic Activity: 8-22 mins PT General Charges $$ ACUTE PT VISIT: 1 Visit                      Jerolyn Center, PT Acute Rehabilitation Services  Office [phone]    Zena Amos 12/21/2022, 10:58 AM

## 2022-12-21 NOTE — Progress Notes (Signed)
[facility]   Reason for visit: Follow up on TAVR associated endocarditis  Interval History: no new complaints.  Creat with slight increase from baseline.   Day 13 total antibiotics  Physical Exam: Constitutional:  Vitals:   12/21/22 1053 12/21/22 1200  BP:  95/66  Pulse: 65 70  Resp: 14 14  Temp:  98.3 F (36.8 C)  SpO2: 97% 98%   patient appears in NAD Respiratory: Normal respiratory effort  Review of Systems: Constitutional: negative for fevers and chills  Lab Results  Component Value Date   WBC 10.8 (H) 12/21/2022   HGB 9.8 (L) 12/21/2022   HCT 30.5 (L) 12/21/2022   MCV 77.8 (L) 12/21/2022   PLT 407 (H) 12/21/2022    Lab Results  Component Value Date   CREATININE 1.09 12/21/2022   BUN 27 (H) 12/21/2022   NA 128 (L) 12/21/2022   K 4.0 12/21/2022   CL 96 (L) 12/21/2022   CO2 24 12/21/2022    Lab Results  Component Value Date   ALT 14 12/11/2022   AST 17 12/11/2022   ALKPHOS 88 12/11/2022     Microbiology: Recent Results (from the past 240 hour(s))  Culture, blood (routine x 2)     Status: None   Collection Time: 12/11/22  4:49 PM   Specimen: Left Antecubital; Blood  Result Value Ref Range Status   Specimen Description LEFT ANTECUBITAL  Final   Special Requests   Final    BOTTLES DRAWN AEROBIC AND ANAEROBIC Blood Culture adequate volume   Culture   Final    NO GROWTH 5 DAYS Performed at [facility], [address], [place], [place] [address]    Report Status 12/16/2022 FINAL  Final  Culture, blood (routine x 2)     Status: None   Collection Time: 12/11/22  4:49 PM   Specimen: Right Antecubital; Blood  Result Value Ref Range Status   Specimen Description RIGHT ANTECUBITAL  Final   Special Requests   Final    BOTTLES DRAWN AEROBIC AND ANAEROBIC Blood Culture adequate volume   Culture   Final    NO GROWTH 5 DAYS Performed at [facility], [address], [place], [place] [address]    Report Status 12/16/2022 FINAL   Final    Impression/Plan:  1. Streptococcal infantarius TAVR associated endocarditis - has remained on gentamicin and ceftriaxone and tolerating well.  Plan for 6 weeks total treatment with ceftriaxone.  For gentamicin, remains in  house with concern for side effects.  I think it is reasonable to have him stop this on 7/26 (Friday) then he can discharge then.    2.  Medication monitoring - small increase in creat and checking gent trough which is < 0.5.    Will continue to monitor.  3.  Access - picc line in place  Diagnosis: Endocarditis with TAVR  Culture Result: Strep infanterius  Allergies  Allergen Reactions   Bee Venom Anaphylaxis   Bystolic [Nebivolol Hcl] Other (See Comments)    Nightmares, flashbacks (PTSD)   Codeine Shortness Of Breath and Rash   Doxycycline Shortness Of Breath and Rash   Iodinated Contrast Media Shortness Of Breath and Anaphylaxis    Lost consciousness/difficulty breathing Omni - Paque Contrast    Iohexol Hives, Shortness Of Breath and Rash    Neck and Torso erythemia IVP-DYE >> SHORTNESS OF BREATH    Levaquin [Levofloxacin] Hives and Shortness Of Breath   Oxycodone Hcl Shortness Of Breath, Swelling and  Rash   Stadol [Butorphanol] Shortness Of Breath   Succinylcholine Chloride Shortness Of Breath, Nausea Only, Rash and Other (See Comments)    Difficulty breathing    Tamiflu [Oseltamivir Phosphate]     UNSPECIFIED REACTION    Atorvastatin     myalgias   Synvisc [Hylan G-F 20] Hives and Rash   Gemfibrozil     Other reaction(s): Serum cholesterol raised   Latex Other (See Comments)    blister   Lisinopril     Other reaction(s): Cough   Omeprazole    Dilaudid [Hydromorphone Hcl] Nausea Only and Rash    Aggressive    Methocarbamol Nausea Only and Rash   Niacin Rash   Pentazocine Lactate Rash    rash    OPAT Orders Discharge antibiotics to be given via PICC line Discharge antibiotics: ceftriaxone 2 grams IV daily Per pharmacy protocol  yes  Duration: 6 weeks End Date: August 22  [facility] Care Per Protocol: yes  Home health RN for IV administration and teaching; PICC line care and labs.    Labs weekly while on IV antibiotics: _x_ CBC with differential __ BMP _x_ CMP __ CRP __ ESR __ Vancomycin trough __ CK  _x_ Please pull PIC at completion of IV antibiotics __ Please leave PIC in place until doctor has seen patient or been notified  Fax weekly labs to [phone]  Clinic Follow Up Appt: 01/12/23 at 4:15p with Dr. Luciana Axe

## 2022-12-21 NOTE — Progress Notes (Signed)
PROGRESS NOTE    [name]  [id number]:[id number] DOB: Oct 19, 1950 DOA: 12/11/2022 PCP: [name], MD    Chief Complaint  Patient presents with   Abnormal labs    Brief Narrative:    [name] is a 72 y.o. male with medical history significant of history of TAVR, COPD, mild dementia, GERD, history of CVA, diabetes, hypertension, atrial fibrillation on anticoagulation.  Patient with recent hospitalization secondary to fall, he was called back to ED as his blood cultures growing Streptococcus, workup significant for TAVR endocarditis.    Assessment & Plan:  Principal Problem:   Aortic valve endocarditis Active Problems:   Diabetes mellitus type 2, controlled, with complications (HCC)   Hypertensive heart disease with chronic diastolic congestive heart failure (HCC)   GERD   COPD mixed type (HCC)   S/P TAVR (transcatheter aortic valve replacement)   Essential hypertension   Acquired hypothyroidism   Streptococcus bacteremia, infected endocarditis of the TAVR valve -Follow on repeat blood cultures, surveillance cultures as admission remains negative -2D echo is significant of mobile mass and mitral valve appears to be attached to aortic prosthesis and some few, the patient went for TEE on 7/15, which was significant for Infective endocarditis of the TAVR valve was noted with a small mobile vegetation in the LVOT - there is at least mild prosthetic valve stenosis and no AI.  -Cardiology consult greatly appreciated, plan for outpatient follow-up and repeat echo after antibiotics is finished -Antibiotics management per ID, continue with IV Rocephin and gentamicin, will need 2 weeks of IV gentamicin, ID would like to keep him until he finishes his 2 weeks of gentamicin on 12/25/2022 given its high risk medications, then he will need to continue IV Rocephin as an outpatient via PICC line.  Falls -Continue with PT/OT  Hypertension - Continue blood pressure  medications  History of TAVR  COPD - Continue long-acting inhalers  Essential hypertension - Continue Coreg, losartan   Mixed hyperlipidemia - Continue Crestor   Persistent atrial fibrillation - Continue Coreg, Eliquis   Acquired hypothyroidism - Continue Synthroid  Hyponatremia - improving     DVT prophylaxis: Eliquis Code Status: Full Family Communication: None at bedside Disposition:   Status is: Inpatient  Consultants:  ID Cardiology   Subjective:  No significant events overnight, he denies any complaints.  Objective: Vitals:   12/21/22 1000 12/21/22 1032 12/21/22 1053 12/21/22 1200  BP: 97/62 (!) 95/54  95/66  Pulse: 60  65 70  Resp: 11  14 14   Temp: 98.2 F (36.8 C)   98.3 F (36.8 C)  TempSrc: Oral   Oral  SpO2: 98%  97% 98%  Weight:        Intake/Output Summary (Last 24 hours) at 12/21/2022 1353 Last data filed at 12/21/2022 0809 Gross per 24 hour  Intake 240 ml  Output 700 ml  Net -460 ml   Filed Weights   12/11/22 2100  Weight: 81.6 kg    Examination:  Awake Alert, Oriented X 3, frail Symmetrical Chest wall movement, Good air movement bilaterally, CTAB RRR,No Gallops,Rubs or new Murmurs, No Parasternal Heave +ve B.Sounds, Abd Soft, No tenderness, No rebound - guarding or rigidity. No Cyanosis, Clubbing or edema, No new Rash or bruise       Data Reviewed: I have personally reviewed following labs and imaging studies  CBC: Recent Labs  Lab 12/16/22 0558 12/17/22 0336 12/19/22 0241 12/20/22 0251 12/21/22 0255  WBC 14.1* 14.6* 13.0* 13.5* 10.8*  NEUTROABS  --   --  8.7* 9.2* 7.0  HGB 10.6* 11.0* 9.7* 9.9* 9.8*  HCT 33.5* 34.1* 30.5* 32.2* 30.5*  MCV 74.9* 76.6* 76.4* 77.8* 77.8*  PLT 406* 402* 410* 438* 407*    Basic Metabolic Panel: Recent Labs  Lab 12/17/22 0336 12/18/22 0748 12/19/22 0241 12/20/22 0251 12/21/22 0255  NA 129* 127* 128* 128* 128*  K 4.7 4.0 4.1 4.0 4.0  CL 100 92* 97* 97* 96*  CO2 22 24 25  24 24   GLUCOSE 97 87 97 92 132*  BUN 16 17 20  24* 27*  CREATININE 0.70 0.86 0.93 1.01 1.09  CALCIUM 8.8* 8.9 8.8* 8.8* 8.7*    GFR: Estimated Creatinine Clearance: 69.2 mL/min (by C-G formula based on SCr of 1.09 mg/dL).  Liver Function Tests: No results for input(s): "AST", "ALT", "ALKPHOS", "BILITOT", "PROT", "ALBUMIN" in the last 168 hours.   CBG: Recent Labs  Lab 12/20/22 1200 12/20/22 1625 12/20/22 2102 12/21/22 0818 12/21/22 1201  GLUCAP 151* 138* 124* 108* 116*     Recent Results (from the past 240 hour(s))  Culture, blood (routine x 2)     Status: None   Collection Time: 12/11/22  4:49 PM   Specimen: Left Antecubital; Blood  Result Value Ref Range Status   Specimen Description LEFT ANTECUBITAL  Final   Special Requests   Final    BOTTLES DRAWN AEROBIC AND ANAEROBIC Blood Culture adequate volume   Culture   Final    NO GROWTH 5 DAYS Performed at [facility], [address], [place], [place] [address]    Report Status 12/16/2022 FINAL  Final  Culture, blood (routine x 2)     Status: None   Collection Time: 12/11/22  4:49 PM   Specimen: Right Antecubital; Blood  Result Value Ref Range Status   Specimen Description RIGHT ANTECUBITAL  Final   Special Requests   Final    BOTTLES DRAWN AEROBIC AND ANAEROBIC Blood Culture adequate volume   Culture   Final    NO GROWTH 5 DAYS Performed at [facility], [address] [place], [place] [address]    Report Status 12/16/2022 FINAL  Final         Radiology Studies: No results found.      Scheduled Meds:  apixaban  5 mg Oral BID   carvedilol  25 mg Oral BID WC   Chlorhexidine Gluconate Cloth  6 each Topical Daily   feeding supplement (GLUCERNA SHAKE)  237 mL Oral TID BM   fluticasone furoate-vilanterol  1 puff Inhalation Daily   insulin aspart  0-5 Units Subcutaneous QHS   insulin aspart  0-9 Units Subcutaneous TID WC   levothyroxine  25 mcg Oral Daily   losartan  50 mg Oral Daily   polyethylene  glycol  17 g Oral Daily   rosuvastatin  20 mg Oral Daily   senna-docusate  2 tablet Oral BID   sertraline  150 mg Oral Daily   spironolactone  12.5 mg Oral Daily   Continuous Infusions:  cefTRIAXone (ROCEPHIN)  IV 2 g (12/20/22 1732)   gentamicin 180 mg (12/21/22 1101)     LOS: 10 days     Huey Bienenstock, MD [facility]   To contact the attending provider between 7A-7P or the covering provider during after hours 7P-7A, please log into the web site [email or web address] and access using universal [place] password for that web site. If you do not have the password, please call the hospital operator.  12/21/2022, 1:53 PM

## 2022-12-22 DIAGNOSIS — I358 Other nonrheumatic aortic valve disorders: Secondary | ICD-10-CM | POA: Diagnosis not present

## 2022-12-22 LAB — GLUCOSE, CAPILLARY
Glucose-Capillary: 104 mg/dL — ABNORMAL HIGH (ref 70–99)
Glucose-Capillary: 107 mg/dL — ABNORMAL HIGH (ref 70–99)
Glucose-Capillary: 119 mg/dL — ABNORMAL HIGH (ref 70–99)
Glucose-Capillary: 112 mg/dL — ABNORMAL HIGH (ref 70–99)

## 2022-12-22 LAB — BASIC METABOLIC PANEL
Anion gap: 5 (ref 5–15)
BUN: 29 mg/dL — ABNORMAL HIGH (ref 8–23)
CO2: 26 mmol/L (ref 22–32)
Calcium: 8.8 mg/dL — ABNORMAL LOW (ref 8.9–10.3)
Chloride: 98 mmol/L (ref 98–111)
Creatinine, Ser: 0.91 mg/dL (ref 0.61–1.24)
GFR, Estimated: >60 mL/min (ref >60)
Glucose, Bld: 102 mg/dL — ABNORMAL HIGH (ref 70–99)
Potassium: 4.1 mmol/L (ref 3.5–5.1)
Sodium: 129 mmol/L — ABNORMAL LOW (ref 135–145)

## 2022-12-22 NOTE — Plan of Care (Signed)

## 2022-12-22 NOTE — Progress Notes (Signed)
PROGRESS NOTE    [name]  BJY:[id number] DOB: May 17, 1951 DOA: 12/11/2022 PCP: [name], MD    Chief Complaint  Patient presents with   Abnormal labs    Brief Narrative:    [name] is a 72 y.o. male with medical history significant of history of TAVR, COPD, mild dementia, GERD, history of CVA, diabetes, hypertension, atrial fibrillation on anticoagulation.  Patient with recent hospitalization secondary to fall, he was called back to ED as his blood cultures growing Streptococcus, workup significant for TAVR endocarditis.    Assessment & Plan:  Principal Problem:   Aortic valve endocarditis Active Problems:   Diabetes mellitus type 2, controlled, with complications (HCC)   Hypertensive heart disease with chronic diastolic congestive heart failure (HCC)   GERD   COPD mixed type (HCC)   S/P TAVR (transcatheter aortic valve replacement)   Essential hypertension   Acquired hypothyroidism   Streptococcus bacteremia, infected endocarditis of the TAVR valve -Follow on repeat blood cultures, surveillance cultures as admission remains negative -2D echo is significant of mobile mass and mitral valve appears to be attached to aortic prosthesis and some few, the patient went for TEE on 7/15, which was significant for Infective endocarditis of the TAVR valve was noted with a small mobile vegetation in the LVOT - there is at least mild prosthetic valve stenosis and no AI.  -Cardiology consult greatly appreciated, plan for outpatient follow-up and repeat echo after antibiotics is finished -Antibiotics management per ID, continue with IV Rocephin and gentamicin, will need 2 weeks of IV gentamicin, ID would like to keep him until he finishes his 2 weeks of gentamicin on 12/25/2022 given its high risk medications, then he will need to continue IV Rocephin as an outpatient via PICC line.  Falls -Continue with PT/OT  Hypertension - Continue blood pressure  medications  History of TAVR  COPD - Continue long-acting inhalers  Essential hypertension - Continue Coreg, losartan   Mixed hyperlipidemia - Continue Crestor   Persistent atrial fibrillation - Continue Coreg, Eliquis   Acquired hypothyroidism - Continue Synthroid  Hyponatremia - improving     DVT prophylaxis: Eliquis Code Status: Full Family Communication: None at bedside Disposition:   Status is: Inpatient  Consultants:  ID Cardiology   Subjective:  No significant events overnight, he denies any complaints.  Objective: Vitals:   12/22/22 0000 12/22/22 0805 12/22/22 0826 12/22/22 1121  BP: 133/73 133/87  135/76  Pulse: (!) 49 (!) 56 (!) 57 60  Resp: 11 15  13   Temp: 98.2 F (36.8 C) 98.2 F (36.8 C)  98.4 F (36.9 C)  TempSrc: Oral Oral  Oral  SpO2: 100% 98%  97%  Weight:        Intake/Output Summary (Last 24 hours) at 12/22/2022 1206 Last data filed at 12/22/2022 1202 Gross per 24 hour  Intake 360 ml  Output 1775 ml  Net -1415 ml   Filed Weights   12/11/22 2100  Weight: 81.6 kg    Examination:  Awake Alert, Oriented X 3, No new F.N deficits, Normal affect Symmetrical Chest wall movement, Good air movement bilaterally, CTAB RRR,No Gallops,Rubs or new Murmurs, No Parasternal Heave +ve B.Sounds, Abd Soft, No tenderness, No rebound - guarding or rigidity. No Cyanosis, Clubbing or edema, No new Rash or bruise       Data Reviewed: I have personally reviewed following labs and imaging studies  CBC: Recent Labs  Lab 12/16/22 0558 12/17/22 0336 12/19/22 0241 12/20/22 0251 12/21/22 0255  WBC 14.1* 14.6* 13.0* 13.5* 10.8*  NEUTROABS  --   --  8.7* 9.2* 7.0  HGB 10.6* 11.0* 9.7* 9.9* 9.8*  HCT 33.5* 34.1* 30.5* 32.2* 30.5*  MCV 74.9* 76.6* 76.4* 77.8* 77.8*  PLT 406* 402* 410* 438* 407*    Basic Metabolic Panel: Recent Labs  Lab 12/18/22 0748 12/19/22 0241 12/20/22 0251 12/21/22 0255 12/22/22 0415  NA 127* 128* 128* 128* 129*   K 4.0 4.1 4.0 4.0 4.1  CL 92* 97* 97* 96* 98  CO2 24 25 24 24 26   GLUCOSE 87 97 92 132* 102*  BUN 17 20 24* 27* 29*  CREATININE 0.86 0.93 1.01 1.09 0.91  CALCIUM 8.9 8.8* 8.8* 8.7* 8.8*    GFR: Estimated Creatinine Clearance: 82.9 mL/min (by C-G formula based on SCr of 0.91 mg/dL).  Liver Function Tests: No results for input(s): "AST", "ALT", "ALKPHOS", "BILITOT", "PROT", "ALBUMIN" in the last 168 hours.   CBG: Recent Labs  Lab 12/21/22 1201 12/21/22 1547 12/21/22 2221 12/22/22 0744 12/22/22 1120  GLUCAP 116* 109* 102* 104* 107*     No results found for this or any previous visit (from the past 240 hour(s)).        Radiology Studies: No results found.      Scheduled Meds:  apixaban  5 mg Oral BID   carvedilol  25 mg Oral BID WC   Chlorhexidine Gluconate Cloth  6 each Topical Daily   feeding supplement (GLUCERNA SHAKE)  237 mL Oral TID BM   fluticasone furoate-vilanterol  1 puff Inhalation Daily   insulin aspart  0-5 Units Subcutaneous QHS   insulin aspart  0-9 Units Subcutaneous TID WC   levothyroxine  25 mcg Oral Daily   losartan  50 mg Oral Daily   polyethylene glycol  17 g Oral Daily   rosuvastatin  20 mg Oral Daily   senna-docusate  2 tablet Oral BID   sertraline  150 mg Oral Daily   spironolactone  12.5 mg Oral Daily   Continuous Infusions:  cefTRIAXone (ROCEPHIN)  IV 2 g (12/21/22 1712)   gentamicin 180 mg (12/22/22 1152)     LOS: 11 days     Huey Bienenstock, MD [facility]   To contact the attending provider between 7A-7P or the covering provider during after hours 7P-7A, please log into the web site [email or web address] and access using universal [place] password for that web site. If you do not have the password, please call the hospital operator.  12/22/2022, 12:06 PM

## 2022-12-22 NOTE — Care Management (Signed)
Clarified w MD, IV Abx provider and [facility] provider that patient will DC on Friday with IV Rocephin.

## 2022-12-22 NOTE — TOC Progression Note (Signed)
Transition of Care [facility]) - Progression Note    Patient Details  Name: [name] MRN: [id number] Date of Birth: 1951-01-04  Transition of Care [facility] CM/SW Contact  Lawerance Sabal, RN Phone Number: 12/22/2022, 4:45 PM  Clinical Narrative:     Notified by Eston Esters with [facility] [phone]) that the patient's infusions have been approved through the [place] with them (not [employer]).  Spoke w Elita Quick of [employer] who was just made aware.  Cory with [facility] is aware that the education for home infusion will now need to be done at home with the first home health visit.  Spoke with patient's wife (x25 minutes) to inform her that [place] has changed the infusion company to [facility], and reviewed the instructions for home infusion (prefilled syringes infused over 10 minutes- per Fredrich Birks) will be given once a day until August 22nd. She voiced concern about helping with this. Reinforced that the patient can also assist with infusion.   Explained targeted DC date of Friday, she will work on lining up transportation.   Discussed his PT progress with her as well.  Answered questions.        Expected Discharge Plan: Home w Home Health Services Barriers to Discharge: Continued Medical Work up  Expected Discharge Plan and Services In-house Referral: NA Discharge Planning Services: CM Consult, Mobile Meals, Other - See comment (coordinating with the [place] [facility] Sharper) for resources in the home) Post Acute Care Choice: Home Health Living arrangements for the past 2 months: Single Family Home                 DME Arranged: IV pump/equipment DME Agency: Other - Comment Julianne Rice) Date DME Agency Contacted: 12/14/22 Time DME Agency Contacted: [phone] Representative spoke with at DME Agency: Jeri Modena HH Arranged: RN, IV Antibiotics HH Agency: [facility] Date [facility] Agency Contacted: 12/22/22 Time HH Agency Contacted: [phone] Representative spoke with at [facility] Agency: Kandee Keen   Social  Determinants of Health (SDOH) Interventions SDOH Screenings   Food Insecurity: No Food Insecurity (12/12/2022)  Housing: Low Risk  (12/12/2022)  Transportation Needs: No Transportation Needs (12/12/2022)  Utilities: Not At Risk (12/12/2022)  Tobacco Use: Medium Risk (12/14/2022)    Readmission Risk Interventions     No data to display

## 2022-12-22 NOTE — Progress Notes (Addendum)
Physical Therapy Treatment Patient Details Name: [name] MRN: [id number] DOB: 1950/10/20 Today's Date: 12/22/2022   History of Present Illness Pt is a 72 y.o. male admitted s/p fall and recent + blood cultures for  Streptococcus infantarius. 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 +PICC line for long-term IV antibiotics. +orthostasis   PMH significant for TAVR, COPD, mild dementia, GERD, history of CVA, diabetes, hypertension, atrial fibrillation on anticoagulation.  Patient with recent admission due to fever. He also relates a history of falling.  He falls once twice a week where he loses his balance backwards because he feels lightheaded. This has been going on for the past few months.  He has been referred to cardiology and neurology for evaluation of his falls.    PT Comments  Patient seen for continued progression of mobility and work on his vestibular rehab. Patient with +orthostasis with supine to sit and sit to stand (see below). His BP did partially recover with prolonged sitting EOB prior to standing and with prolonged standing prior to walking. Today he is reporting feeling like his balance is "off to the right" and did have one imbalance to the right when turning left. See below for vestibular exercises during session. Will plan to provide pt with additional vestibular handouts/resources with expectation for discharge home later this week (per pt).   Orthostatic BPs  Supine 125/63 HR 56  Sitting 88/47  HR 62  Sitting after 5 min 121/67 HR 64  Standing 98/50   Standing after 3 min 101/50    Sitting after walking               111/74    68    Assistance Recommended at Discharge Frequent or constant Supervision/Assistance  If plan is discharge home, recommend the following:  Can travel by private vehicle    A little help with walking and/or transfers;A little help with bathing/dressing/bathroom;Assistance with  cooking/housework;Assist for transportation;Help with stairs or ramp for entrance      Equipment Recommendations  None recommended by PT    Recommendations for Other Services       Precautions / Restrictions Precautions Precautions: Fall Precaution Comments: falls have all been backwards; sometimes dizzy, sometimes just off-balance Restrictions Weight Bearing Restrictions: No     Mobility  Bed Mobility Overal bed mobility: Needs Assistance Bed Mobility: Rolling, Sidelying to Sit Rolling: Modified independent (Device/Increase time) Sidelying to sit: Min guard       General bed mobility comments: moves slowly, used railing,minguard due to dizziness and orthostasis    Transfers Overall transfer level: Needs assistance Equipment used: Rolling walker (2 wheels) Transfers: Sit to/from Stand Sit to Stand: Min assist           General transfer comment: after prolonged sitting EOB, bP recovered and progressed to sit to stand    Ambulation/Gait Ambulation/Gait assistance: Min assist, +2 safety/equipment Gait Distance (Feet): 45 Feet Assistive device: Rolling walker (2 wheels) Gait Pattern/deviations: Step-through pattern, Trunk flexed Gait velocity: slow pace     General Gait Details: feels he is being pulled toward his right; imbalance to right when turning 180 degrees to return to chair; vc for keeping eyes up and on target with pt preferring to look down   Stairs             Wheelchair Mobility     Tilt Bed    Modified Rankin (Stroke Patients Only)       Balance  Overall balance assessment: Needs assistance Sitting-balance support: Feet supported, No upper extremity supported Sitting balance-Leahy Scale: Good     Standing balance support: Bilateral upper extremity supported, Reliant on assistive device for balance Standing balance-Leahy Scale: Poor                              Cognition Arousal/Alertness: Awake/alert Behavior  During Therapy: WFL for tasks assessed/performed Overall Cognitive Status: Within Functional Limits for tasks assessed                                 General Comments: some delayed processing with instructions for VORx1 exercise        Exercises Other Exercises Other Exercises: pt able to return demonstrate VOR x1 with SLOW head turns with +incr symptoms of dizziness/off-balance Other Exercises: seated EOB; visual targets placed on wall; pt instructed to close his eyes and then turn his head to where he thinks the target is and then open. Patient very close to on target when looking up, left or down, however turns head far beyond the right target (overshoots to the right)    General Comments        Pertinent Vitals/Pain Pain Assessment Pain Assessment: Faces Faces Pain Scale: Hurts little more Pain Location: back Pain Descriptors / Indicators: Aching Pain Intervention(s): Limited activity within patient's tolerance, Monitored during session, Repositioned    Home Living                          Prior Function            PT Goals (current goals can now be found in the care plan section) Acute Rehab PT Goals Patient Stated Goal: figure out why I fall Time For Goal Achievement: 12/26/22 Potential to Achieve Goals: Good Progress towards PT goals: Progressing toward goals (slowly due to complex dizziness issues)    Frequency    Min 1X/week      PT Plan Frequency needs to be updated    Co-evaluation              AM-PAC PT "6 Clicks" Mobility   Outcome Measure  Help needed turning from your back to your side while in a flat bed without using bedrails?: A Little Help needed moving from lying on your back to sitting on the side of a flat bed without using bedrails?: A Little Help needed moving to and from a bed to a chair (including a wheelchair)?: A Little Help needed standing up from a chair using your arms (e.g., wheelchair or bedside  chair)?: A Little Help needed to walk in hospital room?: Total Help needed climbing 3-5 steps with a railing? : Total 6 Click Score: 14    End of Session Equipment Utilized During Treatment: Gait belt Activity Tolerance: Treatment limited secondary to medical complications (Comment) (dizziness) Patient left: in chair;with chair alarm set;with call bell/phone within reach Nurse Communication: Mobility status;Other (comment) (orthostasis and vestibulopathy) PT Visit Diagnosis: Repeated falls (R29.6);Other abnormalities of gait and mobility (R26.89);History of falling (Z91.81);Unsteadiness on feet (R26.81)     Time: 0981-1914 PT Time Calculation (min) (ACUTE ONLY): 35 min  Charges:    $Gait Training: 8-22 mins $Therapeutic Exercise: 8-22 mins PT General Charges $$ ACUTE PT VISIT: 1 Visit  Jerolyn Center, PT Acute Rehabilitation Services  Office [phone]    Zena Amos 12/22/2022, 10:06 AM

## 2022-12-23 DIAGNOSIS — I358 Other nonrheumatic aortic valve disorders: Secondary | ICD-10-CM | POA: Diagnosis not present

## 2022-12-23 DIAGNOSIS — I330 Acute and subacute infective endocarditis: Secondary | ICD-10-CM | POA: Diagnosis not present

## 2022-12-23 DIAGNOSIS — B955 Unspecified streptococcus as the cause of diseases classified elsewhere: Secondary | ICD-10-CM | POA: Diagnosis not present

## 2022-12-23 LAB — BASIC METABOLIC PANEL
Anion gap: 6 (ref 5–15)
BUN: 27 mg/dL — ABNORMAL HIGH (ref 8–23)
CO2: 25 mmol/L (ref 22–32)
Calcium: 8.7 mg/dL — ABNORMAL LOW (ref 8.9–10.3)
Chloride: 99 mmol/L (ref 98–111)
Creatinine, Ser: 0.81 mg/dL (ref 0.61–1.24)
GFR, Estimated: >60 mL/min (ref >60)
Glucose, Bld: 109 mg/dL — ABNORMAL HIGH (ref 70–99)
Potassium: 4 mmol/L (ref 3.5–5.1)
Sodium: 130 mmol/L — ABNORMAL LOW (ref 135–145)

## 2022-12-23 LAB — GLUCOSE, CAPILLARY
Glucose-Capillary: 180 mg/dL — ABNORMAL HIGH (ref 70–99)
Glucose-Capillary: 100 mg/dL — ABNORMAL HIGH (ref 70–99)
Glucose-Capillary: 96 mg/dL (ref 70–99)
Glucose-Capillary: 101 mg/dL — ABNORMAL HIGH (ref 70–99)

## 2022-12-23 MED ORDER — CARVEDILOL 12.5 MG PO TABS
12.5000 mg | ORAL_TABLET | Freq: Two times a day (BID) | ORAL | Status: DC
  Administered 2022-12-24 – 2022-12-25 (×3): 12.5 mg via ORAL
  Filled 2022-12-23 (×3): qty 1

## 2022-12-23 NOTE — Plan of Care (Signed)
Problem: Education: Goal: Knowledge of General Education information will improve Description: Including pain rating scale, medication(s)/side effects and non-pharmacologic comfort measures Outcome: Progressing   Problem: Health Behavior/Discharge Planning: Goal: Ability to manage health-related needs will improve Outcome: Progressing   Problem: Clinical Measurements: Goal: Ability to maintain clinical measurements within normal limits will improve Outcome: Progressing Goal: Will remain free from infection Outcome: Progressing Goal: Diagnostic test results will improve Outcome: Progressing Goal: Respiratory complications will improve Outcome: Progressing Goal: Cardiovascular complication will be avoided Outcome: Progressing   Problem: Activity: Goal: Risk for activity intolerance will decrease Outcome: Progressing   Problem: Nutrition: Goal: Adequate nutrition will be maintained Outcome: Progressing   Problem: Coping: Goal: Level of anxiety will decrease Outcome: Progressing   Problem: Elimination: Goal: Will not experience complications related to bowel motility Outcome: Progressing Goal: Will not experience complications related to urinary retention Outcome: Progressing   Problem: Pain Managment: Goal: General experience of comfort will improve Outcome: Progressing   Problem: Safety: Goal: Ability to remain free from injury will improve Outcome: Progressing   Problem: Skin Integrity: Goal: Risk for impaired skin integrity will decrease Outcome: Progressing   Problem: Education: Goal: Ability to describe self-care measures that may prevent or decrease complications (Diabetes Survival Skills Education) will improve Outcome: Progressing Goal: Individualized Educational Video(s) Outcome: Progressing   Problem: Coping: Goal: Ability to adjust to condition or change in health will improve Outcome: Progressing   Problem: Fluid Volume: Goal: Ability to  maintain a balanced intake and output will improve Outcome: Progressing   Problem: Health Behavior/Discharge Planning: Goal: Ability to identify and utilize available resources and services will improve Outcome: Progressing Goal: Ability to manage health-related needs will improve Outcome: Progressing   Problem: Metabolic: Goal: Ability to maintain appropriate glucose levels will improve Outcome: Progressing   Problem: Nutritional: Goal: Maintenance of adequate nutrition will improve Outcome: Progressing Goal: Progress toward achieving an optimal weight will improve Outcome: Progressing   Problem: Skin Integrity: Goal: Risk for impaired skin integrity will decrease Outcome: Progressing   Problem: Tissue Perfusion: Goal: Adequacy of tissue perfusion will improve Outcome: Progressing   Problem: Education: Goal: Knowledge of General Education information will improve Description: Including pain rating scale, medication(s)/side effects and non-pharmacologic comfort measures Outcome: Progressing   Problem: Health Behavior/Discharge Planning: Goal: Ability to manage health-related needs will improve Outcome: Progressing   Problem: Clinical Measurements: Goal: Ability to maintain clinical measurements within normal limits will improve Outcome: Progressing   Problem: Clinical Measurements: Goal: Will remain free from infection Outcome: Progressing   Problem: Clinical Measurements: Goal: Diagnostic test results will improve Outcome: Progressing   Problem: Clinical Measurements: Goal: Respiratory complications will improve Outcome: Progressing   Problem: Clinical Measurements: Goal: Cardiovascular complication will be avoided Outcome: Progressing   Problem: Activity: Goal: Risk for activity intolerance will decrease Outcome: Progressing   Problem: Nutrition: Goal: Adequate nutrition will be maintained Outcome: Progressing   Problem: Coping: Goal: Level of  anxiety will decrease Outcome: Progressing   Problem: Elimination: Goal: Will not experience complications related to bowel motility Outcome: Progressing   Problem: Pain Managment: Goal: General experience of comfort will improve Outcome: Progressing   Problem: Safety: Goal: Ability to remain free from injury will improve Outcome: Progressing   Problem: Skin Integrity: Goal: Risk for impaired skin integrity will decrease Outcome: Progressing   Problem: Education: Goal: Ability to describe self-care measures that may prevent or decrease complications (Diabetes Survival Skills Education) will improve Outcome: Progressing   Problem: Education: Goal: Individualized Educational Video(s) Outcome:  Progressing   Problem: Fluid Volume: Goal: Ability to maintain a balanced intake and output will improve Outcome: Progressing   Problem: Skin Integrity: Goal: Risk for impaired skin integrity will decrease Outcome: Progressing   Problem: Skin Integrity: Goal: Risk for impaired skin integrity will decrease Outcome: Progressing   Problem: Tissue Perfusion: Goal: Adequacy of tissue perfusion will improve Outcome: Progressing

## 2022-12-23 NOTE — Progress Notes (Signed)
Physical Therapy Treatment Patient Details Name: [name] MRN: [id number] DOB: 1950/12/01 Today's Date: 12/23/2022   History of Present Illness Pt is a 72 y.o. male admitted s/p fall and recent + blood cultures for  Streptococcus infantarius. 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 +PICC line for long-term IV antibiotics. +orthostasis   PMH significant for TAVR, COPD, mild dementia, GERD, history of CVA, diabetes, hypertension, atrial fibrillation on anticoagulation.  Patient with recent admission due to fever. He also relates a history of falling.  He falls once twice a week where he loses his balance backwards because he feels lightheaded. This has been going on for the past few months.  He has been referred to cardiology and neurology for evaluation of his falls.    PT Comments  Patient again with incr dizziness (both lightheadedness with +orthostasis) and feeling pulled to the right. BP not rebounding today as it did yesterday and pt did not feel safe to ambulate. Assisted to chair. Lengthy discussion re: ?realistic  to discharge home end of this week if he is not able to ambulate. May benefit from wheelchair to use inside home.   Orthostatic BPs  Supine 100/60  Sitting 94/53  Sitting after 3 min 118/56  Standing 104/60  Sitting 92/64       Assistance Recommended at Discharge Frequent or constant Supervision/Assistance  If plan is discharge home, recommend the following:  Can travel by private vehicle    A little help with walking and/or transfers;A little help with bathing/dressing/bathroom;Assistance with cooking/housework;Assist for transportation;Help with stairs or ramp for entrance      Equipment Recommendations  Wheelchair (measurements PT)    Recommendations for Other Services       Precautions / Restrictions Precautions Precautions: Fall Precaution Comments: falls have all been backwards; sometimes dizzy,  sometimes just off-balance Restrictions Weight Bearing Restrictions: No     Mobility  Bed Mobility Overal bed mobility: Needs Assistance Bed Mobility: Rolling, Sidelying to Sit Rolling: Modified independent (Device/Increase time) Sidelying to sit: Min guard       General bed mobility comments: moves slowly, used railing,minguard due to dizziness and orthostasis    Transfers Overall transfer level: Needs assistance Equipment used: Rolling walker (2 wheels) Transfers: Sit to/from Stand Sit to Stand: Min assist   Step pivot transfers: Min assist       General transfer comment: after prolonged sitting EOB, bP recovered and progressed to sit to stand    Ambulation/Gait               General Gait Details: pt felt being pulled to his right and BP dropped with pt stating he did not feel he can walk   Stairs             Wheelchair Mobility     Tilt Bed    Modified Rankin (Stroke Patients Only)       Balance Overall balance assessment: Needs assistance Sitting-balance support: Feet supported, No upper extremity supported Sitting balance-Leahy Scale: Good     Standing balance support: Bilateral upper extremity supported, Reliant on assistive device for balance Standing balance-Leahy Scale: Poor                              Cognition Arousal/Alertness: Awake/alert Behavior During Therapy: WFL for tasks assessed/performed Overall Cognitive Status: Within Functional Limits for tasks assessed  Exercises General Exercises - Lower Extremity Long Arc Quad: AROM, Both, 10 reps, Seated Toe Raises: AROM, Both, 10 reps Heel Raises: AROM, Both, 10 reps, Seated    General Comments        Pertinent Vitals/Pain Pain Assessment Pain Assessment: Faces Faces Pain Scale: Hurts little more Pain Location: back Pain Descriptors / Indicators: Aching Pain Intervention(s): Limited activity  within patient's tolerance, Repositioned    Home Living                          Prior Function            PT Goals (current goals can now be found in the care plan section) Acute Rehab PT Goals Patient Stated Goal: figure out why I fall Time For Goal Achievement: 12/26/22 Potential to Achieve Goals: Good Progress towards PT goals: Not progressing toward goals - comment (dizziness and orthostasis)    Frequency    Min 1X/week      PT Plan Frequency needs to be updated    Co-evaluation              AM-PAC PT "6 Clicks" Mobility   Outcome Measure  Help needed turning from your back to your side while in a flat bed without using bedrails?: A Little Help needed moving from lying on your back to sitting on the side of a flat bed without using bedrails?: A Little Help needed moving to and from a bed to a chair (including a wheelchair)?: A Little Help needed standing up from a chair using your arms (e.g., wheelchair or bedside chair)?: A Little Help needed to walk in hospital room?: Total Help needed climbing 3-5 steps with a railing? : Total 6 Click Score: 14    End of Session Equipment Utilized During Treatment: Gait belt Activity Tolerance: Treatment limited secondary to medical complications (Comment) (dizziness, othostasis) Patient left: in chair;with chair alarm set;with call bell/phone within reach Nurse Communication: Mobility status;Other (comment) (orthostasis and vestibulopathy) PT Visit Diagnosis: Repeated falls (R29.6);Other abnormalities of gait and mobility (R26.89);History of falling (Z91.81);Unsteadiness on feet (R26.81)     Time: 6213-0865 PT Time Calculation (min) (ACUTE ONLY): 33 min  Charges:    $Therapeutic Exercise: 8-22 mins $Therapeutic Activity: 8-22 mins PT General Charges $$ ACUTE PT VISIT: 1 Visit                      Jerolyn Center, PT Acute Rehabilitation Services  Office [phone]    Zena Amos 12/23/2022, 10:24  AM

## 2022-12-23 NOTE — Progress Notes (Signed)
    [facility]   Reason for visit: follow up on tAVR associated endocarditis  Interval History:some issues with dizziness and imbalance.    Physical Exam: Constitutional:  Vitals:   12/23/22 0832 12/23/22 1205  BP: (!) 108/56 104/60  Pulse: 60   Resp: 16 17  Temp: 98.2 F (36.8 C) 98 F (36.7 C)  SpO2:     patient appears in NAD Respiratory:normal respiratory effort  Review of Systems: Constitutional: negative for fevers and chills  Lab Results  Component Value Date   WBC 10.8 (H) 12/21/2022   HGB 9.8 (L) 12/21/2022   HCT 30.5 (L) 12/21/2022   MCV 77.8 (L) 12/21/2022   PLT 407 (H) 12/21/2022    Lab Results  Component Value Date   CREATININE 0.81 12/23/2022   BUN 27 (H) 12/23/2022   NA 130 (L) 12/23/2022   K 4.0 12/23/2022   CL 99 12/23/2022   CO2 25 12/23/2022    Lab Results  Component Value Date   ALT 14 12/11/2022   AST 17 12/11/2022   ALKPHOS 88 12/11/2022     Microbiology: No results found for this or any previous visit (from the past 240 hour(s)).   Impression/Plan:  1. Streptococcal infantarius TAVR associated endocarditis.  Stop gentamicin with above new complaints.    Diagnosis: Endocarditis with TAVR  Culture Result: Strep infanterius  Allergies  Allergen Reactions   Bee Venom Anaphylaxis   Bystolic [Nebivolol Hcl] Other (See Comments)    Nightmares, flashbacks (PTSD)   Codeine Shortness Of Breath and Rash   Doxycycline Shortness Of Breath and Rash   Iodinated Contrast Media Shortness Of Breath and Anaphylaxis    Lost consciousness/difficulty breathing Omni - Paque Contrast    Iohexol Hives, Shortness Of Breath and Rash    Neck and Torso erythemia IVP-DYE >> SHORTNESS OF BREATH    Levaquin [Levofloxacin] Hives and Shortness Of Breath   Oxycodone Hcl Shortness Of Breath, Swelling and Rash   Stadol [Butorphanol] Shortness Of Breath   Succinylcholine Chloride Shortness Of Breath, Nausea Only, Rash and Other  (See Comments)    Difficulty breathing    Tamiflu [Oseltamivir Phosphate]     UNSPECIFIED REACTION    Atorvastatin     myalgias   Synvisc [Hylan G-F 20] Hives and Rash   Gemfibrozil     Other reaction(s): Serum cholesterol raised   Latex Other (See Comments)    blister   Lisinopril     Other reaction(s): Cough   Omeprazole    Dilaudid [Hydromorphone Hcl] Nausea Only and Rash    Aggressive    Methocarbamol Nausea Only and Rash   Niacin Rash   Pentazocine Lactate Rash    rash    OPAT Orders Discharge antibiotics to be given via PICC line Discharge antibiotics: ceftriaxone 2 grams IV daily Per pharmacy protocol yes  Duration: 6 weeks End Date: August 22  [facility] Care Per Protocol: yes  Home health RN for IV administration and teaching; PICC line care and labs.    Labs weekly while on IV antibiotics: _x_ CBC with differential __ BMP _x_ CMP __ CRP __ ESR __ Vancomycin trough __ CK  _x_ Please pull PIC at completion of IV antibiotics __ Please leave PIC in place until doctor has seen patient or been notified  Fax weekly labs to [phone]  Clinic Follow Up Appt: 01/12/23 at 4:15p with Dr. Luciana Axe

## 2022-12-23 NOTE — Plan of Care (Signed)
  Problem: Education: Goal: Knowledge of General Education information will improve Description: Including pain rating scale, medication(s)/side effects and non-pharmacologic comfort measures Outcome: Progressing   Problem: Health Behavior/Discharge Planning: Goal: Ability to manage health-related needs will improve Outcome: Progressing   Problem: Clinical Measurements: Goal: Ability to maintain clinical measurements within normal limits will improve Outcome: Progressing Goal: Will remain free from infection Outcome: Progressing   Problem: Activity: Goal: Risk for activity intolerance will decrease Outcome: Progressing   Problem: Activity: Goal: Risk for activity intolerance will decrease Outcome: Progressing   Problem: Nutrition: Goal: Adequate nutrition will be maintained Outcome: Progressing

## 2022-12-23 NOTE — TOC Progression Note (Addendum)
Transition of Care [facility]) - Progression Note    Patient Details  Name: [name] MRN: [id number] Date of Birth: [birth year]  Transition of Care [facility]) CM/SW Contact  Lawerance Sabal, RN Phone Number: 12/23/2022, 9:02 AM  Clinical Narrative:     Faxed clinicals to Eston Esters at [facility] at [phone]. Ph- [phone] IV Medication (once daily Rocephin) will be sent to the home. She is aware of planned Friday DC  Spoke to pharmacy who is adjusting med admin time to 12 noon so that patient can have dose midday and won't have to be discharged too late come Friday. Patient will need dose in hospital on day of DC  Spoke w patient at bedside who is agreeable to home med administration and learning how to self admin. Patient will need extender on PICC line for home. I spoke a Vernona Rieger from [facility] and she will notify her pharmacy to include this with the medication delivery.   Confirmed w Frances Furbish that patient will have [facility] RN Saturday for first home infusion and education.   Expected Discharge Plan: Home w Home Health Services Barriers to Discharge: Continued Medical Work up  Expected Discharge Plan and Services In-house Referral: NA Discharge Planning Services: CM Consult, Mobile Meals, Other - See comment (coordinating with the [place] [facility]) for resources in the home) Post Acute Care Choice: Home Health Living arrangements for the past 2 months: Single Family Home                 DME Arranged: IV pump/equipment DME Agency: Other - Comment Julianne Rice) Date DME Agency Contacted: 12/14/22 Time DME Agency Contacted: [phone] Representative spoke with at DME Agency: Jeri Modena HH Arranged: RN, IV Antibiotics HH Agency: [facility] Date [facility] Agency Contacted: 12/22/22 Time HH Agency Contacted: [phone] Representative spoke with at [facility] Agency: Kandee Keen   Social Determinants of Health (SDOH) Interventions SDOH Screenings   Food Insecurity: No Food Insecurity (12/12/2022)   Housing: Low Risk  (12/12/2022)  Transportation Needs: No Transportation Needs (12/12/2022)  Utilities: Not At Risk (12/12/2022)  Tobacco Use: Medium Risk (12/14/2022)    Readmission Risk Interventions     No data to display

## 2022-12-23 NOTE — Progress Notes (Addendum)
PROGRESS NOTE    [name]  BMW:[id number] DOB: [birth year] DOA: 12/11/2022 PCP: [name], MD    Chief Complaint  Patient presents with   Abnormal labs    Brief Narrative:    [name] is a 72 y.o. male with medical history significant of history of TAVR, COPD, mild dementia, GERD, history of CVA, diabetes, hypertension, atrial fibrillation on anticoagulation.  Patient with recent hospitalization secondary to fall, he was called back to ED as his blood cultures growing Streptococcus, workup significant for TAVR endocarditis.    Assessment & Plan:  Principal Problem:   Aortic valve endocarditis Active Problems:   Diabetes mellitus type 2, controlled, with complications (HCC)   Hypertensive heart disease with chronic diastolic congestive heart failure (HCC)   GERD   COPD mixed type (HCC)   S/P TAVR (transcatheter aortic valve replacement)   Essential hypertension   Acquired hypothyroidism   Streptococcus bacteremia, infected endocarditis of the TAVR valve -Follow on repeat blood cultures, surveillance cultures as admission remains negative -2D echo is significant of mobile mass and mitral valve appears to be attached to aortic prosthesis and some few, the patient went for TEE on 7/15, which was significant for Infective endocarditis of the TAVR valve was noted with a small mobile vegetation in the LVOT - there is at least mild prosthetic valve stenosis and no AI.  -Cardiology consult greatly appreciated, plan for outpatient follow-up and repeat echo after antibiotics is finished -Antibiotics management per ID, continue with IV Rocephin and gentamicin, will need 2 weeks of IV gentamicin, ID would like to keep him until he finishes his 2 weeks of gentamicin on 12/25/2022 given its high risk medications, then he will need to continue IV Rocephin as an outpatient via PICC line.  Patient started to develop some unsteady gait, increased dizziness, so we will discontinue  gentamicin due to concerns of phototoxicity.     Falls -Continue with PT/OT  Hypertension -Blood pressure started to decrease, as well heart rate has decreased as well, so we will decrease his Coreg dosing.  History of TAVR  COPD - Continue long-acting inhalers  Essential hypertension - Continue Coreg, losartan   Mixed hyperlipidemia - Continue Crestor   Persistent atrial fibrillation - Continue Coreg, Eliquis   Acquired hypothyroidism - Continue Synthroid  Hyponatremia - improving     DVT prophylaxis: Eliquis Code Status: Full Family Communication: None at bedside Disposition:   Status is: Inpatient  Consultants:  ID Cardiology   Subjective:  No significant events overnight, patient reports some increased dizziness, feeling pulled to the right open ambulation  Objective: Vitals:   12/23/22 0119 12/23/22 0543 12/23/22 0832 12/23/22 1205  BP: 114/60 130/70 (!) 108/56 104/60  Pulse: (!) 49 (!) 51 60   Resp: 18  16 17   Temp: 97.6 F (36.4 C) 98 F (36.7 C) 98.2 F (36.8 C) 98 F (36.7 C)  TempSrc:   Oral Oral  SpO2: 93% 98%    Weight:        Intake/Output Summary (Last 24 hours) at 12/23/2022 1412 Last data filed at 12/23/2022 1212 Gross per 24 hour  Intake 873 ml  Output 1761 ml  Net -888 ml   Filed Weights   12/11/22 2100  Weight: 81.6 kg    Examination:  Awake Alert, Oriented X 3, No new F.N deficits, Normal affect Symmetrical Chest wall movement, Good air movement bilaterally, CTAB RRR,No Gallops,Rubs or new Murmurs, No Parasternal Heave +ve B.Sounds, Abd Soft, No tenderness,  No rebound - guarding or rigidity. No Cyanosis, Clubbing or edema, No new Rash or bruise       Data Reviewed: I have personally reviewed following labs and imaging studies  CBC: Recent Labs  Lab 12/17/22 0336 12/19/22 0241 12/20/22 0251 12/21/22 0255  WBC 14.6* 13.0* 13.5* 10.8*  NEUTROABS  --  8.7* 9.2* 7.0  HGB 11.0* 9.7* 9.9* 9.8*  HCT 34.1* 30.5*  32.2* 30.5*  MCV 76.6* 76.4* 77.8* 77.8*  PLT 402* 410* 438* 407*    Basic Metabolic Panel: Recent Labs  Lab 12/19/22 0241 12/20/22 0251 12/21/22 0255 12/22/22 0415 12/23/22 0756  NA 128* 128* 128* 129* 130*  K 4.1 4.0 4.0 4.1 4.0  CL 97* 97* 96* 98 99  CO2 25 24 24 26 25   GLUCOSE 97 92 132* 102* 109*  BUN 20 24* 27* 29* 27*  CREATININE 0.93 1.01 1.09 0.91 0.81  CALCIUM 8.8* 8.8* 8.7* 8.8* 8.7*    GFR: Estimated Creatinine Clearance: 93.2 mL/min (by C-G formula based on SCr of 0.81 mg/dL).  Liver Function Tests: No results for input(s): "AST", "ALT", "ALKPHOS", "BILITOT", "PROT", "ALBUMIN" in the last 168 hours.   CBG: Recent Labs  Lab 12/22/22 1120 12/22/22 1518 12/22/22 2017 12/23/22 0834 12/23/22 1146  GLUCAP 107* 119* 112* 180* 100*     No results found for this or any previous visit (from the past 240 hour(s)).        Radiology Studies: No results found.      Scheduled Meds:  apixaban  5 mg Oral BID   carvedilol  12.5 mg Oral BID WC   Chlorhexidine Gluconate Cloth  6 each Topical Daily   feeding supplement (GLUCERNA SHAKE)  237 mL Oral TID BM   fluticasone furoate-vilanterol  1 puff Inhalation Daily   insulin aspart  0-5 Units Subcutaneous QHS   insulin aspart  0-9 Units Subcutaneous TID WC   levothyroxine  25 mcg Oral Daily   losartan  50 mg Oral Daily   polyethylene glycol  17 g Oral Daily   rosuvastatin  20 mg Oral Daily   senna-docusate  2 tablet Oral BID   sertraline  150 mg Oral Daily   spironolactone  12.5 mg Oral Daily   Continuous Infusions:  cefTRIAXone (ROCEPHIN)  IV 2 g (12/23/22 1211)     LOS: 12 days     Huey Bienenstock, MD [facility]   To contact the attending provider between 7A-7P or the covering provider during after hours 7P-7A, please log into the web site [email or web address] and access using universal [place] password for that web site. If you do not have the password, please call the hospital  operator.  12/23/2022, 2:12 PM

## 2022-12-24 DIAGNOSIS — E871 Hypo-osmolality and hyponatremia: Secondary | ICD-10-CM | POA: Diagnosis not present

## 2022-12-24 DIAGNOSIS — I358 Other nonrheumatic aortic valve disorders: Secondary | ICD-10-CM | POA: Diagnosis not present

## 2022-12-24 LAB — BASIC METABOLIC PANEL
BUN: 33 mg/dL — ABNORMAL HIGH (ref 8–23)
CO2: 24 mmol/L (ref 22–32)
Calcium: 8.6 mg/dL — ABNORMAL LOW (ref 8.9–10.3)
Chloride: 94 mmol/L — ABNORMAL LOW (ref 98–111)
Creatinine, Ser: 1.11 mg/dL (ref 0.61–1.24)
Glucose, Bld: 102 mg/dL — ABNORMAL HIGH (ref 70–99)
Potassium: 4.1 mmol/L (ref 3.5–5.1)
Sodium: 127 mmol/L — ABNORMAL LOW (ref 135–145)

## 2022-12-24 LAB — CBC
HCT: 29.4 % — ABNORMAL LOW (ref 39.0–52.0)
Hemoglobin: 9.2 g/dL — ABNORMAL LOW (ref 13.0–17.0)
MCH: 23.8 pg — ABNORMAL LOW (ref 26.0–34.0)
MCHC: 31.3 g/dL (ref 30.0–36.0)
MCV: 76.2 fL — ABNORMAL LOW (ref 80.0–100.0)
Platelets: 352 10*3/uL (ref 150–400)
RBC: 3.86 MIL/uL — ABNORMAL LOW (ref 4.22–5.81)
RDW: 16.3 % — ABNORMAL HIGH (ref 11.5–15.5)
WBC: 11.4 10*3/uL — ABNORMAL HIGH (ref 4.0–10.5)
nRBC: 0 % (ref 0.0–0.2)

## 2022-12-24 LAB — GLUCOSE, CAPILLARY
Glucose-Capillary: 130 mg/dL — ABNORMAL HIGH (ref 70–99)
Glucose-Capillary: 118 mg/dL — ABNORMAL HIGH (ref 70–99)
Glucose-Capillary: 98 mg/dL (ref 70–99)
Glucose-Capillary: 114 mg/dL — ABNORMAL HIGH (ref 70–99)

## 2022-12-24 MED ORDER — LOSARTAN POTASSIUM 50 MG PO TABS
25.0000 mg | ORAL_TABLET | Freq: Every day | ORAL | Status: DC
  Administered 2022-12-25: 25 mg via ORAL
  Filled 2022-12-24: qty 1

## 2022-12-24 MED ORDER — SODIUM CHLORIDE 1 G PO TABS
1.0000 g | ORAL_TABLET | Freq: Three times a day (TID) | ORAL | Status: DC
  Administered 2022-12-24 – 2022-12-25 (×5): 1 g via ORAL
  Filled 2022-12-24 (×5): qty 1

## 2022-12-24 NOTE — Progress Notes (Addendum)
Physical Therapy Treatment Patient Details Name: [name] MRN: [id number] DOB: [birth year] Today's Date: 12/24/2022   History of Present Illness Pt is a 72 y.o. male admitted s/p fall and recent + blood cultures for  Streptococcus infantarius. 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 +PICC line for long-term IV antibiotics. +orthostasis   PMH significant for TAVR, COPD, mild dementia, GERD, history of CVA, diabetes, hypertension, atrial fibrillation on anticoagulation.  Patient with recent admission due to fever. He also relates a history of falling.  He falls once twice a week where he loses his balance backwards because he feels lightheaded. This has been going on for the past few months.  He has been referred to cardiology and neurology for evaluation of his falls.    PT Comments  Patient reports worsening dizziness compared to previous days. Feels he is being pulled toward the right. BP with slight drop, but not truly orthostatic. Checked for BPPV with all canals negative. Patient reports he does have a wheelchair at home and wife able to assist him in w/c up the ramp to enter home if necessary. Noted gentamicin to be stopped 7/26. Continue to recommend vestibular rehab via [name] on discharge.    Orthostatic BPs  Supine 112/60 HR 59  Sitting 98/57  66  Sitting after ambulation 104/57  63       Assistance Recommended at Discharge Frequent or constant Supervision/Assistance  If plan is discharge home, recommend the following:  Can travel by private vehicle    A little help with walking and/or transfers;A little help with bathing/dressing/bathroom;Assistance with cooking/housework;Assist for transportation;Help with stairs or ramp for entrance      Equipment Recommendations  None recommended by PT (pt now reports having w/c at home)    Recommendations for Other Services       Precautions / Restrictions Precautions Precautions:  Fall Precaution Comments: falls have all been backwards; sometimes dizzy, sometimes just off-balance Restrictions Weight Bearing Restrictions: No     Mobility  Bed Mobility Overal bed mobility: Needs Assistance Bed Mobility: Rolling, Sidelying to Sit Rolling: Modified independent (Device/Increase time) Sidelying to sit: Min guard       General bed mobility comments: moves slowly, used railing,minguard due to dizziness    Transfers Overall transfer level: Needs assistance Equipment used: Rolling walker (2 wheels) Transfers: Sit to/from Stand Sit to Stand: Min guard           General transfer comment: pt with some drop in BP side to sit, but not truly orthostasis; reports dizziness feels more like being pulled to his right    Ambulation/Gait Ambulation/Gait assistance: Min guard Gait Distance (Feet): 12 Feet Assistive device: Rolling walker (2 wheels) Gait Pattern/deviations: Step-through pattern, Trunk flexed Gait velocity: slow pace     General Gait Details: pt felt being pulled to his right with increasing dizziness and turned around after 6 Ft   Stairs             Wheelchair Mobility     Tilt Bed    Modified Rankin (Stroke Patients Only)       Balance Overall balance assessment: Needs assistance Sitting-balance support: Feet supported, No upper extremity supported Sitting balance-Leahy Scale: Good     Standing balance support: Bilateral upper extremity supported, Reliant on assistive device for balance Standing balance-Leahy Scale: Poor  Cognition Arousal/Alertness: Awake/alert Behavior During Therapy: WFL for tasks assessed/performed Overall Cognitive Status: Within Functional Limits for tasks assessed                                          Exercises Other Exercises Other Exercises: pt unable to demonstrate x1 VOR exercise due to significant dizziness (not orthostatic, but  feeling pulled to the right). Attempted multiple times and unable to demonstrate Other Exercises: reports he has been working on imaginary targets exercise when sitting upright in bed facing his targets    General Comments General comments (skin integrity, edema, etc.): Patient reports he already has a wheelchair at home and can use to get in/out of home and inside home      Pertinent Vitals/Pain Pain Assessment Pain Assessment: 0-10 Pain Score: 4  Pain Location: back Pain Descriptors / Indicators: Aching Pain Intervention(s): Limited activity within patient's tolerance, Monitored during session, Repositioned    Home Living                          Prior Function            PT Goals (current goals can now be found in the care plan section) Acute Rehab PT Goals Patient Stated Goal: figure out why I fall Time For Goal Achievement: 12/26/22 Potential to Achieve Goals: Good Progress towards PT goals: Not progressing toward goals - comment (dizziness persists)    Frequency    Min 1X/week      PT Plan Current plan remains appropriate    Co-evaluation              AM-PAC PT "6 Clicks" Mobility   Outcome Measure  Help needed turning from your back to your side while in a flat bed without using bedrails?: A Little Help needed moving from lying on your back to sitting on the side of a flat bed without using bedrails?: A Little Help needed moving to and from a bed to a chair (including a wheelchair)?: A Little Help needed standing up from a chair using your arms (e.g., wheelchair or bedside chair)?: A Little Help needed to walk in hospital room?: A Lot Help needed climbing 3-5 steps with a railing? : Total 6 Click Score: 15    End of Session Equipment Utilized During Treatment: Gait belt Activity Tolerance: Treatment limited secondary to medical complications (Comment) (dizziness) Patient left: in chair;with chair alarm set;with call bell/phone within  reach;with nursing/sitter in room Nurse Communication: Mobility status;Other (comment) (vestibulopathy) PT Visit Diagnosis: Repeated falls (R29.6);Other abnormalities of gait and mobility (R26.89);History of falling (Z91.81);Unsteadiness on feet (R26.81)     Time: 1610-9604 PT Time Calculation (min) (ACUTE ONLY): 33 min  Charges:    $Therapeutic Activity: 23-37 mins PT General Charges $$ ACUTE PT VISIT: 1 Visit                      [name], PT Acute Rehabilitation Services  Office [phone]    Zena Amos 12/24/2022, 10:40 AM

## 2022-12-24 NOTE — Progress Notes (Signed)
PROGRESS NOTE    [name]  ZOX:[id number] DOB: 1951-05-30 DOA: 12/11/2022 PCP: [name], MD    Chief Complaint  Patient presents with   Abnormal labs    Brief Narrative:    [name] is a 72 y.o. male with medical history significant of history of TAVR, COPD, mild dementia, GERD, history of CVA, diabetes, hypertension, atrial fibrillation on anticoagulation.  Patient with recent hospitalization secondary to fall, he was called back to ED as his blood cultures growing Streptococcus, workup significant for TAVR endocarditis.    Assessment & Plan:  Principal Problem:   Aortic valve endocarditis Active Problems:   Diabetes mellitus type 2, controlled, with complications (HCC)   Hypertensive heart disease with chronic diastolic congestive heart failure (HCC)   GERD   COPD mixed type (HCC)   S/P TAVR (transcatheter aortic valve replacement)   Essential hypertension   Acquired hypothyroidism   Streptococcus bacteremia, infected endocarditis of the TAVR valve -Follow on repeat blood cultures, surveillance cultures as admission remains negative -2D echo is significant of mobile mass and mitral valve appears to be attached to aortic prosthesis and some few, the patient went for TEE on 7/15, which was significant for Infective endocarditis of the TAVR valve was noted with a small mobile vegetation in the LVOT - there is at least mild prosthetic valve stenosis and no AI.  -Cardiology consult greatly appreciated, plan for outpatient follow-up and repeat echo after antibiotics is finished -Antibiotics management per ID, continue with IV Rocephin and gentamicin, will need 2 weeks of IV gentamicin, ID would like to keep him until he finishes his 2 weeks of gentamicin on 12/25/2022 given its high risk medications, then he will need to continue IV Rocephin as an outpatient via PICC line.  Patient started to develop some unsteady gait, increased dizziness, so we will discontinue  gentamicin due to concerns of ototoxicity  Per natremia -Trending down to 127 today, will start on salt tablets, discussed with patient fluid restriction at 1200 cc.  Falls -Continue with PT/OT  Hypertension -Blood pressure started to decrease, as well heart rate has decreased as well, so we will decrease his Coreg dosing.  History of TAVR  COPD - Continue long-acting inhalers  Essential hypertension - Continue Coreg, losartan, but blood pressure is soft and heart rate lower side, so I will decrease his Coreg and losartan by 50%.   Mixed hyperlipidemia - Continue Crestor   Persistent atrial fibrillation - Continue Coreg, Eliquis   Acquired hypothyroidism - Continue Synthroid     DVT prophylaxis: Eliquis Code Status: Full Family Communication: None at bedside Disposition:   Status is: Inpatient  Consultants:  ID Cardiology   Subjective:  Reports his unsteady gait has improved.  Objective: Vitals:   12/24/22 0048 12/24/22 0733 12/24/22 0811 12/24/22 1245  BP: 115/70  (!) 117/58 103/61  Pulse:   63 62  Resp: 13   14  Temp: 98.3 F (36.8 C)   98.3 F (36.8 C)  TempSrc: Oral   Oral  SpO2:  96% 94%   Weight:        Intake/Output Summary (Last 24 hours) at 12/24/2022 1316 Last data filed at 12/24/2022 1002 Gross per 24 hour  Intake 1122.5 ml  Output 725 ml  Net 397.5 ml   Filed Weights   12/11/22 2100  Weight: 81.6 kg    Examination:  Awake Alert, Oriented X 3, No new F.N deficits, Normal affect Symmetrical Chest wall movement, Good air movement  bilaterally, CTAB RRR,No Gallops,Rubs or new Murmurs, No Parasternal Heave +ve B.Sounds, Abd Soft, No tenderness, No rebound - guarding or rigidity. No Cyanosis, Clubbing or edema, No new Rash or bruise        Data Reviewed: I have personally reviewed following labs and imaging studies  CBC: Recent Labs  Lab 12/19/22 0241 12/20/22 0251 12/21/22 0255 12/24/22 0028  WBC 13.0* 13.5* 10.8* 11.4*   NEUTROABS 8.7* 9.2* 7.0  --   HGB 9.7* 9.9* 9.8* 9.2*  HCT 30.5* 32.2* 30.5* 29.4*  MCV 76.4* 77.8* 77.8* 76.2*  PLT 410* 438* 407* 352    Basic Metabolic Panel: Recent Labs  Lab 12/20/22 0251 12/21/22 0255 12/22/22 0415 12/23/22 0756 12/24/22 0028  NA 128* 128* 129* 130* 127*  K 4.0 4.0 4.1 4.0 4.1  CL 97* 96* 98 99 94*  CO2 24 24 26 25 24   GLUCOSE 92 132* 102* 109* 102*  BUN 24* 27* 29* 27* 33*  CREATININE 1.01 1.09 0.91 0.81 1.11  CALCIUM 8.8* 8.7* 8.8* 8.7* 8.6*    GFR: Estimated Creatinine Clearance: 68 mL/min (by C-G formula based on SCr of 1.11 mg/dL).  Liver Function Tests: No results for input(s): "AST", "ALT", "ALKPHOS", "BILITOT", "PROT", "ALBUMIN" in the last 168 hours.   CBG: Recent Labs  Lab 12/23/22 1146 12/23/22 1600 12/23/22 2125 12/24/22 0824 12/24/22 1245  GLUCAP 100* 96 101* 130* 118*     No results found for this or any previous visit (from the past 240 hour(s)).        Radiology Studies: No results found.      Scheduled Meds:  apixaban  5 mg Oral BID   carvedilol  12.5 mg Oral BID WC   Chlorhexidine Gluconate Cloth  6 each Topical Daily   feeding supplement (GLUCERNA SHAKE)  237 mL Oral TID BM   fluticasone furoate-vilanterol  1 puff Inhalation Daily   insulin aspart  0-5 Units Subcutaneous QHS   insulin aspart  0-9 Units Subcutaneous TID WC   levothyroxine  25 mcg Oral Daily   losartan  50 mg Oral Daily   polyethylene glycol  17 g Oral Daily   rosuvastatin  20 mg Oral Daily   senna-docusate  2 tablet Oral BID   sertraline  150 mg Oral Daily   sodium chloride  1 g Oral TID WC   spironolactone  12.5 mg Oral Daily   Continuous Infusions:  cefTRIAXone (ROCEPHIN)  IV 2 g (12/24/22 1138)     LOS: 13 days     Huey Bienenstock, MD [facility]   To contact the attending provider between 7A-7P or the covering provider during after hours 7P-7A, please log into the web site [email or web address] and access using  universal [place] password for that web site. If you do not have the password, please call the hospital operator.  12/24/2022, 1:16 PM

## 2022-12-24 NOTE — Plan of Care (Signed)
Problem: Education: Goal: Knowledge of General Education information will improve Description: Including pain rating scale, medication(s)/side effects and non-pharmacologic comfort measures Outcome: Progressing   Problem: Health Behavior/Discharge Planning: Goal: Ability to manage health-related needs will improve Outcome: Progressing   Problem: Clinical Measurements: Goal: Ability to maintain clinical measurements within normal limits will improve Outcome: Progressing Goal: Will remain free from infection Outcome: Progressing Goal: Diagnostic test results will improve Outcome: Progressing Goal: Respiratory complications will improve Outcome: Progressing Goal: Cardiovascular complication will be avoided Outcome: Progressing   Problem: Activity: Goal: Risk for activity intolerance will decrease Outcome: Progressing   Problem: Nutrition: Goal: Adequate nutrition will be maintained Outcome: Progressing   Problem: Coping: Goal: Level of anxiety will decrease Outcome: Progressing   Problem: Elimination: Goal: Will not experience complications related to bowel motility Outcome: Progressing Goal: Will not experience complications related to urinary retention Outcome: Progressing   Problem: Pain Managment: Goal: General experience of comfort will improve Outcome: Progressing   Problem: Safety: Goal: Ability to remain free from injury will improve Outcome: Progressing   Problem: Skin Integrity: Goal: Risk for impaired skin integrity will decrease Outcome: Progressing   Problem: Education: Goal: Ability to describe self-care measures that may prevent or decrease complications (Diabetes Survival Skills Education) will improve Outcome: Progressing Goal: Individualized Educational Video(s) Outcome: Progressing   Problem: Coping: Goal: Ability to adjust to condition or change in health will improve Outcome: Progressing   Problem: Fluid Volume: Goal: Ability to  maintain a balanced intake and output will improve Outcome: Progressing   Problem: Health Behavior/Discharge Planning: Goal: Ability to identify and utilize available resources and services will improve Outcome: Progressing Goal: Ability to manage health-related needs will improve Outcome: Progressing   Problem: Metabolic: Goal: Ability to maintain appropriate glucose levels will improve Outcome: Progressing   Problem: Nutritional: Goal: Maintenance of adequate nutrition will improve Outcome: Progressing Goal: Progress toward achieving an optimal weight will improve Outcome: Progressing   Problem: Skin Integrity: Goal: Risk for impaired skin integrity will decrease Outcome: Progressing   Problem: Tissue Perfusion: Goal: Adequacy of tissue perfusion will improve Outcome: Progressing   Problem: Education: Goal: Knowledge of General Education information will improve Description: Including pain rating scale, medication(s)/side effects and non-pharmacologic comfort measures Outcome: Progressing   Problem: Health Behavior/Discharge Planning: Goal: Ability to manage health-related needs will improve Outcome: Progressing   Problem: Clinical Measurements: Goal: Ability to maintain clinical measurements within normal limits will improve Outcome: Progressing   Problem: Clinical Measurements: Goal: Will remain free from infection Outcome: Progressing   Problem: Clinical Measurements: Goal: Diagnostic test results will improve Outcome: Progressing   Problem: Clinical Measurements: Goal: Respiratory complications will improve Outcome: Progressing   Problem: Clinical Measurements: Goal: Cardiovascular complication will be avoided Outcome: Progressing   Problem: Activity: Goal: Risk for activity intolerance will decrease Outcome: Progressing   Problem: Nutrition: Goal: Adequate nutrition will be maintained Outcome: Progressing   Problem: Coping: Goal: Level of  anxiety will decrease Outcome: Progressing   Problem: Elimination: Goal: Will not experience complications related to bowel motility Outcome: Progressing   Problem: Elimination: Goal: Will not experience complications related to urinary retention Outcome: Progressing   Problem: Skin Integrity: Goal: Risk for impaired skin integrity will decrease Outcome: Progressing   Problem: Education: Goal: Ability to describe self-care measures that may prevent or decrease complications (Diabetes Survival Skills Education) will improve Outcome: Progressing   Problem: Education: Goal: Individualized Educational Video(s) Outcome: Progressing   Problem: Skin Integrity: Goal: Risk for impaired skin integrity will decrease  Outcome: Progressing   Problem: Fluid Volume: Goal: Ability to maintain a balanced intake and output will improve Outcome: Progressing   Problem: Health Behavior/Discharge Planning: Goal: Ability to identify and utilize available resources and services will improve Outcome: Progressing   Problem: Health Behavior/Discharge Planning: Goal: Ability to identify and utilize available resources and services will improve Outcome: Progressing   Problem: Health Behavior/Discharge Planning: Goal: Ability to manage health-related needs will improve Outcome: Progressing   Problem: Metabolic: Goal: Ability to maintain appropriate glucose levels will improve Outcome: Progressing   Problem: Tissue Perfusion: Goal: Adequacy of tissue perfusion will improve Outcome: Progressing   Problem: Skin Integrity: Goal: Risk for impaired skin integrity will decrease Outcome: Progressing   Problem: Nutritional: Goal: Progress toward achieving an optimal weight will improve Outcome: Progressing

## 2022-12-25 ENCOUNTER — Other Ambulatory Visit (HOSPITAL_COMMUNITY): Payer: Self-pay

## 2022-12-25 DIAGNOSIS — I358 Other nonrheumatic aortic valve disorders: Secondary | ICD-10-CM | POA: Diagnosis not present

## 2022-12-25 LAB — CBC: MCH: 24 pg — ABNORMAL LOW (ref 26.0–34.0)

## 2022-12-25 LAB — GLUCOSE, CAPILLARY
Glucose-Capillary: 136 mg/dL — ABNORMAL HIGH (ref 70–99)
Glucose-Capillary: 99 mg/dL (ref 70–99)

## 2022-12-25 MED ORDER — LOSARTAN POTASSIUM 25 MG PO TABS
25.0000 mg | ORAL_TABLET | Freq: Every day | ORAL | 0 refills | Status: DC
  Filled 2022-12-25: qty 30, 30d supply, fill #0

## 2022-12-25 MED ORDER — HEPARIN SOD (PORK) LOCK FLUSH 100 UNIT/ML IV SOLN
250.0000 [IU] | INTRAVENOUS | Status: AC | PRN
  Administered 2022-12-25: 250 [IU]

## 2022-12-25 MED ORDER — CARVEDILOL 12.5 MG PO TABS
12.5000 mg | ORAL_TABLET | Freq: Two times a day (BID) | ORAL | 0 refills | Status: DC
Start: 2022-12-25 — End: 2023-01-08
  Filled 2022-12-25: qty 60, 30d supply, fill #0

## 2022-12-25 MED ORDER — CEFTRIAXONE IV (FOR PTA / DISCHARGE USE ONLY): 2.0000 g | INTRAVENOUS | 0 refills | Status: AC

## 2022-12-25 NOTE — Discharge Summary (Signed)
Physician Discharge Summary  [name] UXL:[id number] DOB: 1951-04-14 DOA: 12/11/2022  PCP: [name], MD  Admit date: 12/11/2022 Discharge date: 12/25/2022    Recommendations for Outpatient Follow-up:  Follow up with PCP in 1-2 weeks Please obtain BMP/CBC in one week plan for outpatient follow-up and repeat echo after antibiotics is finished Patient Coreg and losartan dose has been lowered given soft blood pressure and bradycardia during hospital stay, Lasix and Aldactone has been discontinued at time of discharge given euvolemia, can be adjusted as an outpatient if needed.     OPAT Orders Discharge antibiotics to be given via PICC line Discharge antibiotics: ceftriaxone 2 grams IV daily Per pharmacy protocol yes   Duration: 6 weeks End Date: August 22   [facility] Care Per Protocol: yes   Home health RN for IV administration and teaching; PICC line care and labs.     Labs weekly while on IV antibiotics: _x_ CBC with differential __ BMP _x_ CMP __ CRP __ ESR __ Vancomycin trough __ CK   _x_ Please pull PIC at completion of IV antibiotics __ Please leave PIC in place until doctor has seen patient or been notified   Fax weekly labs to [phone]   Clinic Follow Up Appt: 01/12/23 at 4:15p with Dr. Luciana Axe     Home Health (YES)  Diet recommendation: Heart Healthy   Brief/Interim Summary:   [name] is a 72 y.o. male with medical history significant of history of TAVR, COPD, mild dementia, GERD, history of CVA, diabetes, hypertension, atrial fibrillation on anticoagulation.  Patient with recent hospitalization secondary to fall, he was called back to ED as his blood cultures growing Streptococcus, workup significant for TAVR endocarditis.      Streptococcus bacteremia, infected endocarditis of the TAVR valve -SIRS came back positive during recent hospital stay at [facility], follow on repeat blood cultures, surveillance cultures as admission  remains negative -2D echo is significant of mobile mass and mitral valve appears to be attached to aortic prosthesis and some few, the patient went for TEE on 7/15, which was significant for Infective endocarditis of the TAVR valve was noted with a small mobile vegetation in the LVOT - there is at least mild prosthetic valve stenosis and no AI.  -Cardiology consult greatly appreciated, plan for outpatient follow-up and repeat echo after antibiotics is finished -Antibiotics management per ID, mentation for Rocephin and gentamicin x 2 weeks, then Rocephin alone tell 01/21/2023, he was kept during hospital stay for gentamicin treatment as high risk medication, gentamicin has been discontinued 7/24 as he developed some unsteady gait, increased dizziness  Hyponatremia -Improved, 129 at time of discharge   Falls -Continue with PT/OT   Hypertension -Blood pressure has been soft recently, as well had some bradycardia, so losartan dose has been decreased from 100 mg to 25 mg oral daily, Coreg dose has been decreased from 25 mg to 12.5 mg p.o. twice daily  -Lasix and Aldactone has been discontinued as well   History of TAVR -Structural cardiology team has been consulted during hospital stay   COPD - Continue long-acting inhalers    Mixed hyperlipidemia - Continue Crestor   Persistent atrial fibrillation - Continue Coreg, Eliquis   Acquired hypothyroidism - Continue Synthroid    Discharge Diagnoses:  Principal Problem:   Aortic valve endocarditis Active Problems:   Diabetes mellitus type 2, controlled, with complications (HCC)   Hypertensive heart disease with chronic diastolic congestive heart failure (HCC)   GERD  COPD mixed type (HCC)   S/P TAVR (transcatheter aortic valve replacement)   Essential hypertension   Acquired hypothyroidism    Discharge Instructions  Discharge Instructions     [facility] pharmacist to adjust dose for Vancomycin, Aminoglycosides and  other anti-infective therapies as requested by physician.   Complete by: As directed    [facility] to provide Cath Flo 2mg    Complete by: As directed    Administer for PICC line occlusion and as ordered by physician for other access device issues.   Anaphylaxis Kit: Provided to treat any anaphylactic reaction to the medication being provided to the patient if First Dose or when requested by physician   Complete by: As directed    Epinephrine 1mg /ml vial / amp: Administer 0.3mg  (0.14ml) subcutaneously once for moderate to severe anaphylaxis, nurse to call physician and pharmacy when reaction occurs and call 911 if needed for immediate care   Diphenhydramine 50mg /ml IV vial: Administer 25-50mg  IV/IM PRN for first dose reaction, rash, itching, mild reaction, nurse to call physician and pharmacy when reaction occurs   Sodium Chloride 0.9% NS IV: Administer if needed for hypovolemic blood pressure drop or as ordered by physician after call to physician with anaphylactic reaction   Change dressing on IV access line weekly and PRN   Complete by: As directed    Diet - low sodium heart healthy   Complete by: As directed    Flush IV access with Sodium Chloride 0.9% and Heparin 10 units/ml or 100 units/ml   Complete by: As directed    Home infusion instructions - [facility]   Complete by: As directed    Instructions: Flush IV access with Sodium Chloride 0.9% and Heparin 10units/ml or 100units/ml   Change dressing on IV access line: Weekly and PRN   Instructions Cath Flo 2mg : Administer for PICC Line occlusion and as ordered by physician for other access device   [facility] pharmacist to adjust dose for: Vancomycin, Aminoglycosides and other anti-infective therapies as requested by physician   Increase activity slowly   Complete by: As directed    Method of administration may be changed at the discretion of home infusion pharmacist based upon assessment of the  patient and/or caregiver's ability to self-administer the medication ordered   Complete by: As directed    No wound care   Complete by: As directed       Allergies as of 12/25/2022       Reactions   Bee Venom Anaphylaxis   Bystolic [nebivolol Hcl] Other (See Comments)   Nightmares, flashbacks (PTSD)   Codeine Shortness Of Breath, Rash   Doxycycline Shortness Of Breath, Rash   Iodinated Contrast Media Shortness Of Breath, Anaphylaxis   Lost consciousness/difficulty breathing Omni - Paque Contrast    Iohexol Hives, Shortness Of Breath, Rash   Neck and Torso erythemia IVP-DYE >> SHORTNESS OF BREATH   Levaquin [levofloxacin] Hives, Shortness Of Breath   Oxycodone Hcl Shortness Of Breath, Swelling, Rash   Stadol [butorphanol] Shortness Of Breath   Succinylcholine Chloride Shortness Of Breath, Nausea Only, Rash, Other (See Comments)   Difficulty breathing   Tamiflu [oseltamivir Phosphate]    UNSPECIFIED REACTION    Atorvastatin    myalgias   Synvisc [hylan G-f 20] Hives, Rash   Gemfibrozil    Other reaction(s): Serum cholesterol raised   Latex Other (See Comments)   blister   Lisinopril    Other reaction(s): Cough   Omeprazole  Dilaudid [hydromorphone Hcl] Nausea Only, Rash   Aggressive   Methocarbamol Nausea Only, Rash   Niacin Rash   Pentazocine Lactate Rash   rash        Medication List     STOP taking these medications    aspirin 81 MG chewable tablet   furosemide 20 MG tablet Commonly known as: LASIX   spironolactone 25 MG tablet Commonly known as: ALDACTONE       TAKE these medications    albuterol 0.63 MG/3ML nebulizer solution Commonly known as: ACCUNEB Take 1 ampule by nebulization every 6 (six) hours as needed for wheezing or shortness of breath.   albuterol 108 (90 Base) MCG/ACT inhaler Commonly known as: VENTOLIN HFA Inhale 1 puff into the lungs every 4 (four) hours as needed for wheezing or shortness of breath.   ALPRAZolam 0.5 MG  tablet Commonly known as: XANAX Take 0.5 mg by mouth 3 (three) times daily as needed for anxiety or sleep.   apixaban 5 MG Tabs tablet Commonly known as: ELIQUIS Take 5 mg by mouth 2 (two) times daily.   beclomethasone 42 MCG/SPRAY nasal spray Commonly known as: BECONASE-AQ Place 1 spray into both nostrils daily.   carvedilol 12.5 MG tablet Commonly known as: COREG Take 1 tablet (12.5 mg total) by mouth 2 (two) times daily with a meal. What changed:  medication strength how much to take when to take this Another medication with the same name was removed. Continue taking this medication, and follow the directions you see here.   cefTRIAXone IVPB Commonly known as: ROCEPHIN Inject 2 g into the vein daily. Indication: Strep infantarius TAVR-associated endocarditis First Dose: Yes Last Day of Therapy: 01/21/2023 Labs - Once weekly:  CBC/D and BMP, Labs - Once weekly: ESR and CRP Method of administration: IV Push Method of administration may be changed at the discretion of home infusion pharmacist based upon assessment of the patient and/or caregiver's ability to self-administer the medication ordered.   cyclobenzaprine 5 MG tablet Commonly known as: FLEXERIL Take 5 mg by mouth 3 (three) times daily as needed.   EPINEPHrine 0.3 mg/0.3 mL Soaj injection Commonly known as: EPI-PEN Inject 0.3 mg into the muscle once as needed for anaphylaxis.   ferrous sulfate 325 (65 FE) MG tablet Take 325 mg by mouth daily.   fluticasone-salmeterol 250-50 MCG/ACT Aepb Commonly known as: ADVAIR Inhale 1 puff into the lungs in the morning and at bedtime. Rinse mouth well with water after each use   losartan 25 MG tablet Commonly known as: COZAAR Take 1 tablet (25 mg total) by mouth daily. What changed:  medication strength how much to take   MILK OF MAGNESIA PO Take 1 Dose by mouth as needed (constipation).   morphine 15 MG tablet Commonly known as: MSIR Take 15 mg by mouth every 4  (four) hours as needed for severe pain.   multivitamin with minerals Tabs tablet Take 1 tablet by mouth at bedtime.   nitroGLYCERIN 0.4 MG SL tablet Commonly known as: NITROSTAT Place 1 tablet (0.4 mg total) under the tongue every 5 (five) minutes as needed for chest pain.   polyethylene glycol powder 17 GM/SCOOP powder Commonly known as: GLYCOLAX/MIRALAX Take 1 Container by mouth 2 (two) times daily.   promethazine 25 MG tablet Commonly known as: PHENERGAN Take 25 mg by mouth 4 (four) times daily as needed for nausea or vomiting.   rosuvastatin 40 MG tablet Commonly known as: CRESTOR Take 40 mg by mouth daily.  sertraline 100 MG tablet Commonly known as: ZOLOFT Take 150 mg by mouth daily.   Vitamin D-3 125 MCG (5000 UT) Tabs Take 5,000 Units by mouth daily.               Discharge Care Instructions  (From admission, onward)           Start     Ordered   12/25/22 0000  Change dressing on IV access line weekly and PRN  (Home infusion instructions - [facility] )        12/25/22 0821            Follow-up Information     [facility] Follow up.   Specialty: Home Health Services Why: [facility] will provide you with Skilled Nursing for IV infusion needs as well as Physical Therapy. They will be contacting you to arrange a home visit Contact information: [address] [place] [address] [phone]         [facility] Follow up.   Why: [facility] is proving your home IV medication and PICC extender Contact information: [address]  [place]  [place]  [address]  [phone]               Allergies  Allergen Reactions   Bee Venom Anaphylaxis   Bystolic [Nebivolol Hcl] Other (See Comments)    Nightmares, flashbacks (PTSD)   Codeine Shortness Of Breath and Rash   Doxycycline Shortness Of Breath and Rash   Iodinated Contrast Media Shortness Of Breath and Anaphylaxis    Lost  consciousness/difficulty breathing Omni - Paque Contrast    Iohexol Hives, Shortness Of Breath and Rash    Neck and Torso erythemia IVP-DYE >> SHORTNESS OF BREATH    Levaquin [Levofloxacin] Hives and Shortness Of Breath   Oxycodone Hcl Shortness Of Breath, Swelling and Rash   Stadol [Butorphanol] Shortness Of Breath   Succinylcholine Chloride Shortness Of Breath, Nausea Only, Rash and Other (See Comments)    Difficulty breathing    Tamiflu [Oseltamivir Phosphate]     UNSPECIFIED REACTION    Atorvastatin     myalgias   Synvisc [Hylan G-F 20] Hives and Rash   Gemfibrozil     Other reaction(s): Serum cholesterol raised   Latex Other (See Comments)    blister   Lisinopril     Other reaction(s): Cough   Omeprazole    Dilaudid [Hydromorphone Hcl] Nausea Only and Rash    Aggressive    Methocarbamol Nausea Only and Rash   Niacin Rash   Pentazocine Lactate Rash    rash    Consultations: ID Cardiology   Procedures/Studies: [place] EKG SITE RITE  Result Date: 12/16/2022 If Site Rite image not attached, placement could not be confirmed due to current cardiac rhythm.  ECHO TEE  Result Date: 12/14/2022    TRANSESOPHOGEAL ECHO REPORT   Patient Name:   [name] Date of Exam: 12/14/2022 Medical Rec #:  [id number]       Height:       73.0 in Accession #:    [id number]      Weight:       179.9 lb Date of Birth:  01-07-1951       BSA:          2.057 m Patient Age:    72 years        BP:           159/93 mmHg Patient Gender: M  HR:           59 bpm. Exam Location:  Inpatient Procedure: Transesophageal Echo, Color Doppler and Cardiac Doppler Indications:     Bacteremia  History:         Patient has prior history of Echocardiogram examinations, most                  recent 12/12/2022. COPD; Risk Factors:Hypertension, Diabetes and                  Dyslipidemia. 29mm Edwards Sapien TAVR Implanted 08/08/19.                   Aortic Valve: 29 mm Sapien prosthetic, stented (TAVR) valve is                   present in the aortic position. Procedure Date: 2021.  Sonographer:     Harriette Bouillon RDCS Referring Phys:  [id number] Corrin Parker Diagnosing Phys: Zoila Shutter MD PROCEDURE: After discussion of the risks and benefits of a TEE, an informed consent was obtained from the patient. The transesophogeal probe was passed without difficulty through the esophogus of the patient. Sedation performed by different physician. The patient was monitored while under deep sedation. Anesthestetic sedation was provided intravenously by Anesthesiology: 195mg  of Propofol, 100mg  of Lidocaine. Supplementary images were obtained from transthoracic windows as indicated to answer the clinical question. The patient developed no complications during the procedure.  IMPRESSIONS  1. Left ventricular ejection fraction, by estimation, is 55 to 60%. The left ventricle has normal function. There is mild left ventricular hypertrophy.  2. Right ventricular systolic function is normal. The right ventricular size is normal.  3. No left atrial/left atrial appendage thrombus was detected.  4. The mitral valve is degenerative. Mild mitral valve regurgitation.  5. Mobile filmentous mass in the LVOT adherant to the valve, likely vegetation. The aortic valve has been repaired/replaced. There is moderate thickening of the aortic valve. Aortic valve regurgitation is not visualized. Mild to moderate aortic valve stenosis. There is a 29 mm Sapien prosthetic (TAVR) valve present in the aortic position. Procedure Date: 2021. 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 Aortic valve area, by VTI measures 1.42 cm. Aortic valve mean gradient  measures 15.5 mmHg. Aortic valve Vmax measures 2.50 m/s.  6. There is mild (Grade II) layered plaque involving the ascending aorta and aortic arch. 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 Consider structural  heart team evaluation. Suspect treatment will be prolonged course of antibiotic therapy and repeat imaging of the valve after. FINDINGS  Left Ventricle: Left ventricular ejection fraction, by estimation, is 55 to 60%. The left ventricle has normal function. The left ventricular internal cavity size was normal in size. There is mild left ventricular hypertrophy. Right Ventricle: The right ventricular size is normal. No increase in right ventricular wall thickness. Right ventricular systolic function is normal. Left Atrium: Left atrial size was normal in size. No left atrial/left atrial appendage thrombus was detected. Right Atrium: Right atrial size was normal in size. Pericardium: There is no evidence of pericardial effusion. Mitral Valve: The mitral valve is degenerative in appearance. There is moderate thickening of the mitral valve leaflet(s). There is moderate calcification of the posterior mitral valve leaflet(s). Mild to moderate mitral annular calcification. Mild mitral valve regurgitation. Tricuspid Valve: The tricuspid valve is grossly normal. Tricuspid valve regurgitation is trivial. Aortic Valve: Mobile filmentous mass in the LVOT  adherant to the valve, likely vegetation. The aortic valve has been repaired/replaced. There is moderate thickening of the aortic valve. Aortic valve regurgitation is not visualized. Mild to moderate aortic stenosis is present. Aortic valve mean gradient measures 15.5 mmHg. Aortic valve peak gradient measures 25.1 mmHg. Aortic valve area, by VTI measures 1.42 cm. There is a 29 mm Sapien prosthetic, stented (TAVR) valve present in the aortic position. Procedure Date: 2021. Pulmonic Valve: The pulmonic valve was not well visualized. Pulmonic valve regurgitation is not visualized. Aorta: The aortic root and ascending aorta are structurally normal, with no evidence of dilitation. There is mild (Grade II) layered plaque involving the ascending aotra and aortic arch. Venous: The left  lower pulmonary vein and left upper pulmonary vein are normal. IAS/Shunts: No atrial level shunt detected by color flow Doppler. Additional Comments: Spectral Doppler performed. LEFT VENTRICLE PLAX 2D LVOT diam:     2.20 cm LV SV:         72 LV SV Index:   35 LVOT Area:     3.80 cm  AORTIC VALVE AV Area (Vmax):    1.91 cm AV Area (Vmean):   1.77 cm AV Area (VTI):     1.42 cm AV Vmax:           250.50 cm/s AV Vmean:          185.000 cm/s AV VTI:            0.508 m AV Peak Grad:      25.1 mmHg AV Mean Grad:      15.5 mmHg LVOT Vmax:         126.00 cm/s LVOT Vmean:        86.300 cm/s LVOT VTI:          0.190 m LVOT/AV VTI ratio: 0.37  AORTA Ao Asc diam: 4.00 cm  SHUNTS Systemic VTI:  0.19 m Systemic Diam: 2.20 cm Zoila Shutter MD Electronically signed by Zoila Shutter MD Signature Date/Time: 12/14/2022/2:41:36 PM    Final    EP STUDY  Result Date: 12/14/2022 See surgical note for result.  ECHOCARDIOGRAM LIMITED  Result Date: 12/12/2022    ECHOCARDIOGRAM LIMITED REPORT   Patient Name:   [name] Date of Exam: 12/12/2022 Medical Rec #:  [id number]       Height:       73.0 in Accession #:    [id number]      Weight:       179.9 lb Date of Birth:  18-Aug-1950       BSA:          2.057 m Patient Age:    72 years        BP:           134/71 mmHg Patient Gender: M               HR:           57 bpm. Exam Location:  Inpatient Procedure: 2D Echo, Color Doppler and Cardiac Doppler Indications:    Bacteremia  History:        Patient has prior history of Echocardiogram examinations, most                 recent 11/19/2022. COPD, Arrythmias:Atrial Fibrillation,                 Signs/Symptoms:Fever; Risk Factors:Hypertension, Dyslipidemia                 and Diabetes.  Aortic Valve: 29 mm Edwards Sapien prosthetic, stented (TAVR)                 valve is present in the aortic position. Procedure Date: 08/08/19.  Sonographer:    Delcie Roch RDCS Referring Phys: [phone] JACOB J STINSON IMPRESSIONS  1.  Limited Echo to evaluate for infective endocarditis.  2. Left ventricular ejection fraction, by estimation, is 60 to 65%. The left ventricle has normal function. The left ventricle has no regional wall motion abnormalities. There is mild left ventricular hypertrophy. Left ventricular diastolic parameters are consistent with Grade I diastolic dysfunction (impaired relaxation).  3. Right ventricular systolic function is normal. The right ventricular size is normal. There is normal pulmonary artery systolic pressure.  4. The mitral valve is abnormal. Mild mitral valve regurgitation. No evidence of mitral stenosis. Severe mitral annular calcification. There is a mobile pedunculated echodensity measuring 0.2 x 0.3 cm attached to the ventricular surface of anterior mitral valve leaflet which is noted to be protruding into the LVOT. In some views, it appears to be attached to the aortic prosthesis, although, with no evidence of aortic valvular dysfunction. Strongly consider TEE for further evaluation.  5. The aortic valve was not well visualized. Aortic valve regurgitation is not visualized. No aortic stenosis is present. There is a 29 mm Edwards Sapien prosthetic (TAVR) valve present in the aortic position. Procedure Date: 08/08/19. Aortic valve mean gradient measures 16.0 mmHg.  6. The inferior vena cava is normal in size with greater than 50% respiratory variability, suggesting right atrial pressure of 3 mmHg. Comparison(s): Changes from prior study are noted. Mobile echodensity noted, see above. FINDINGS  Left Ventricle: Left ventricular ejection fraction, by estimation, is 60 to 65%. The left ventricle has normal function. The left ventricle has no regional wall motion abnormalities. The left ventricular internal cavity size was normal in size. There is  mild left ventricular hypertrophy. Left ventricular diastolic parameters are consistent with Grade I diastolic dysfunction (impaired relaxation). Right Ventricle: The  right ventricular size is normal. No increase in right ventricular wall thickness. Right ventricular systolic function is normal. There is normal pulmonary artery systolic pressure. The tricuspid regurgitant velocity is 2.35 m/s, and  with an assumed right atrial pressure of 3 mmHg, the estimated right ventricular systolic pressure is 25.1 mmHg. Left Atrium: Left atrial size was not assessed. Right Atrium: Right atrial size was not assessed. Pericardium: There is no evidence of pericardial effusion. Mitral Valve: The mitral valve is abnormal. Severe mitral annular calcification. Mild mitral valve regurgitation. No evidence of mitral valve stenosis. Tricuspid Valve: The tricuspid valve is normal in structure. Tricuspid valve regurgitation is not demonstrated. No evidence of tricuspid stenosis. Aortic Valve: The aortic valve was not well visualized. Aortic valve regurgitation is not visualized. No aortic stenosis is present. Aortic valve mean gradient measures 16.0 mmHg. Aortic valve peak gradient measures 30.5 mmHg. Aortic valve area, by VTI measures 1.43 cm. There is a 29 mm Edwards Sapien prosthetic, stented (TAVR) valve present in the aortic position. Procedure Date: 08/08/19. Pulmonic Valve: The pulmonic valve was not well visualized. Pulmonic valve regurgitation is not visualized. No evidence of pulmonic stenosis. Aorta: Aortic root could not be assessed. Venous: The inferior vena cava is normal in size with greater than 50% respiratory variability, suggesting right atrial pressure of 3 mmHg. IAS/Shunts: No atrial level shunt detected by color flow Doppler. Additional Comments: Spectral Doppler performed. Color Doppler performed.  LEFT VENTRICLE PLAX 2D LVIDd:  5.10 cm LVIDs:         3.20 cm LV PW:         1.30 cm LV IVS:        1.20 cm LVOT diam:     2.10 cm LV SV:         86 LV SV Index:   42 LVOT Area:     3.46 cm  IVC IVC diam: 1.60 cm LEFT ATRIUM         Index LA diam:    3.60 cm 1.75 cm/m  AORTIC  VALVE AV Area (Vmax):    1.33 cm AV Area (Vmean):   1.29 cm AV Area (VTI):     1.43 cm AV Vmax:           276.00 cm/s AV Vmean:          183.000 cm/s AV VTI:            0.603 m AV Peak Grad:      30.5 mmHg AV Mean Grad:      16.0 mmHg LVOT Vmax:         106.00 cm/s LVOT Vmean:        68.350 cm/s LVOT VTI:          0.250 m LVOT/AV VTI ratio: 0.41 MITRAL VALVE                TRICUSPID VALVE MV Area (PHT): 2.54 cm     TR Peak grad:   22.1 mmHg MV Decel Time: 299 msec     TR Vmax:        235.00 cm/s MV E velocity: 114.00 cm/s MV A velocity: 174.00 cm/s  SHUNTS MV E/A ratio:  0.66         Systemic VTI:  0.25 m                             Systemic Diam: 2.10 cm Vishnu Priya Mallipeddi Electronically signed by Winfield Rast Mallipeddi Signature Date/Time: 12/12/2022/4:20:23 PM    Final    DG Chest 2 View  Result Date: 12/11/2022 CLINICAL DATA:  Cough. EXAM: CHEST - 2 VIEW COMPARISON:  December 09, 2022. FINDINGS: The heart size and mediastinal contours are within normal limits. Status post transcatheter aortic valve repair. Both lungs are clear. The visualized skeletal structures are unremarkable. IMPRESSION: No active cardiopulmonary disease. Electronically Signed   By: Lupita Raider M.D.   On: 12/11/2022 16:15   CT CHEST ABDOMEN PELVIS WO CONTRAST  Result Date: 12/09/2022 CLINICAL DATA:  Pneumonia, complication suspected, xray done.  Fall. EXAM: CT CHEST, ABDOMEN AND PELVIS WITHOUT CONTRAST TECHNIQUE: Multidetector CT imaging of the chest, abdomen and pelvis was performed following the standard protocol without IV contrast. RADIATION DOSE REDUCTION: This exam was performed according to the departmental dose-optimization program which includes automated exposure control, adjustment of the mA and/or kV according to patient size and/or use of iterative reconstruction technique. COMPARISON:  09/20/2020 FINDINGS: CT CHEST FINDINGS Cardiovascular: Heart is normal size. Aorta is normal caliber. Prior TAVR coronary  artery and aortic atherosclerosis. Mediastinum/Nodes: No mediastinal, hilar, or axillary adenopathy. Trachea and esophagus are unremarkable. Thyroid unremarkable. Lungs/Pleura: Lungs are clear. No focal airspace opacities or suspicious nodules. No effusions. Musculoskeletal: Chest wall soft tissues are unremarkable. No acute bony abnormality. CT ABDOMEN PELVIS FINDINGS Hepatobiliary: No focal liver abnormality is seen. Status post cholecystectomy. No biliary dilatation. Pancreas: No focal abnormality or ductal dilatation.  Spleen: No focal abnormality.  Normal size. Adrenals/Urinary Tract: No adrenal abnormality. No focal renal abnormality. No stones or hydronephrosis. Urinary bladder is unremarkable. Stomach/Bowel: Large stool burden throughout the colon suggesting constipation. Gaseous distention of the colon may reflect ileus. Stomach and small bowel decompressed, unremarkable. Vascular/Lymphatic: Aortic atherosclerosis. No evidence of aneurysm or adenopathy. Reproductive: Insert male pelvis Other: No free fluid or free air. Musculoskeletal: Mild compression fracture through the superior endplate of L1. This is new since 2022. IMPRESSION: Large stool burden throughout the colon compatible with constipation. Diffuse gaseous distention of the colon may reflect ileus. No acute cardiopulmonary disease. Mild compression fracture through the superior endplate of L1, age indeterminate but new since 2022. Coronary artery disease, aortic atherosclerosis. Electronically Signed   By: Charlett Nose M.D.   On: 12/09/2022 23:10   CT Head Wo Contrast  Result Date: 12/09/2022 CLINICAL DATA:  Fall hitting head on floor. Right forehead hematoma. EXAM: CT HEAD WITHOUT CONTRAST TECHNIQUE: Contiguous axial images were obtained from the base of the skull through the vertex without intravenous contrast. RADIATION DOSE REDUCTION: This exam was performed according to the departmental dose-optimization program which includes automated  exposure control, adjustment of the mA and/or kV according to patient size and/or use of iterative reconstruction technique. COMPARISON:  Head CT 02/07/2022 FINDINGS: Brain: No intracranial hemorrhage, mass effect, or midline shift. Generalized atrophy with mild progression from prior. No hydrocephalus. The basilar cisterns are patent. Minimal periventricular chronic small vessel ischemic change. Encephalomalacia in the posteroinferior left frontal lobe is new from prior exam, but likely represents interval ischemia. No evidence of territorial infarct or acute ischemia. No extra-axial or intracranial fluid collection. Vascular: Atherosclerosis of skullbase vasculature without hyperdense vessel or abnormal calcification. Skull: No fracture or focal lesion. Sinuses/Orbits: Mucosal thickening throughout the ethmoid air cells, chronic. No acute findings. Other: Right frontal scalp hematoma. IMPRESSION: 1. Right frontal scalp hematoma. No acute intracranial abnormality. No skull fracture. 2. Generalized atrophy and chronic small vessel ischemic change. Encephalomalacia in the posteroinferior left frontal lobe is new from September 2023 exam, likely represents interval ischemia. Electronically Signed   By: Narda Rutherford M.D.   On: 12/09/2022 22:21   DG Chest 2 View  Result Date: 12/09/2022 CLINICAL DATA:  Suspected sepsis. EXAM: CHEST - 2 VIEW COMPARISON:  Chest radiograph dated 02/07/2022. FINDINGS: No focal consolidation, pleural effusion, or pneumothorax. Top-normal cardiac size. Aortic valve repair. Atherosclerotic calcification of the aorta. No acute osseous pathology. IMPRESSION: No active cardiopulmonary disease. Electronically Signed   By: Elgie Collard M.D.   On: 12/09/2022 21:39   (Echo, Carotid, EGD, Colonoscopy, ERCP)    Subjective:  No significant events overnight, he denies any complaints. Discharge Exam: Vitals:   12/25/22 0452 12/25/22 0827  BP: 115/60 (!) 93/54  Pulse:  (!) 58  Resp:  (!) 9 15  Temp: 97.7 F (36.5 C) 97.8 F (36.6 C)  SpO2:     Vitals:   12/24/22 2023 12/24/22 2357 12/25/22 0452 12/25/22 0827  BP: (!) 111/57 108/62 115/60 (!) 93/54  Pulse:    (!) 58  Resp: 11 12 (!) 9 15  Temp: 98.4 F (36.9 C) 97.9 F (36.6 C) 97.7 F (36.5 C) 97.8 F (36.6 C)  TempSrc: Oral Oral Oral Oral  SpO2:      Weight:        General: Pt is alert, awake, not in acute distress Cardiovascular: RRR, S1/S2 +, no rubs, no gallops Respiratory: CTA bilaterally, no wheezing, no rhonchi Abdominal: Soft, NT,  ND, bowel sounds + Extremities: no edema, no cyanosis    The results of significant diagnostics from this hospitalization (including imaging, microbiology, ancillary and laboratory) are listed below for reference.     Microbiology: No results found for this or any previous visit (from the past 240 hour(s)).   Labs: BNP (last 3 results) Recent Labs    12/09/22 2125  BNP 290.0*   Basic Metabolic Panel: Recent Labs  Lab 12/21/22 0255 12/22/22 0415 12/23/22 0756 12/24/22 0028 12/25/22 0359  NA 128* 129* 130* 127* 129*  K 4.0 4.1 4.0 4.1 4.2  CL 96* 98 99 94* 95*  CO2 24 26 25 24 25   GLUCOSE 132* 102* 109* 102* 95  BUN 27* 29* 27* 33* 32*  CREATININE 1.09 0.91 0.81 1.11 1.05  CALCIUM 8.7* 8.8* 8.7* 8.6* 8.8*   Liver Function Tests: No results for input(s): "AST", "ALT", "ALKPHOS", "BILITOT", "PROT", "ALBUMIN" in the last 168 hours. No results for input(s): "LIPASE", "AMYLASE" in the last 168 hours. No results for input(s): "AMMONIA" in the last 168 hours. CBC: Recent Labs  Lab 12/19/22 0241 12/20/22 0251 12/21/22 0255 12/24/22 0028 12/25/22 0359  WBC 13.0* 13.5* 10.8* 11.4* 10.4  NEUTROABS 8.7* 9.2* 7.0  --   --   HGB 9.7* 9.9* 9.8* 9.2* 8.9*  HCT 30.5* 32.2* 30.5* 29.4* 28.5*  MCV 76.4* 77.8* 77.8* 76.2* 76.8*  PLT 410* 438* 407* 352 349   Cardiac Enzymes: No results for input(s): "CKTOTAL", "CKMB", "CKMBINDEX", "TROPONINI" in the last  168 hours. BNP: Invalid input(s): "POCBNP" CBG: Recent Labs  Lab 12/24/22 0824 12/24/22 1245 12/24/22 1606 12/24/22 2022 12/25/22 0850  GLUCAP 130* 118* 98 114* 136*   D-Dimer No results for input(s): "DDIMER" in the last 72 hours. Hgb A1c No results for input(s): "HGBA1C" in the last 72 hours. Lipid Profile No results for input(s): "CHOL", "HDL", "LDLCALC", "TRIG", "CHOLHDL", "LDLDIRECT" in the last 72 hours. Thyroid function studies No results for input(s): "TSH", "T4TOTAL", "T3FREE", "THYROIDAB" in the last 72 hours.  Invalid input(s): "FREET3" Anemia work up No results for input(s): "VITAMINB12", "FOLATE", "FERRITIN", "TIBC", "IRON", "RETICCTPCT" in the last 72 hours. Urinalysis    Component Value Date/Time   COLORURINE YELLOW 12/11/2022 1950   APPEARANCEUR CLEAR 12/11/2022 1950   LABSPEC 1.014 12/11/2022 1950   PHURINE 6.0 12/11/2022 1950   GLUCOSEU NEGATIVE 12/11/2022 1950   GLUCOSEU NEGATIVE 02/02/2007 0936   HGBUR NEGATIVE 12/11/2022 1950   BILIRUBINUR NEGATIVE 12/11/2022 1950   BILIRUBINUR negative 12/10/2015 1503   KETONESUR NEGATIVE 12/11/2022 1950   PROTEINUR NEGATIVE 12/11/2022 1950   UROBILINOGEN 2.0 12/10/2015 1503   UROBILINOGEN 1.0 01/16/2014 1146   NITRITE NEGATIVE 12/11/2022 1950   LEUKOCYTESUR NEGATIVE 12/11/2022 1950   Sepsis Labs Recent Labs  Lab 12/20/22 0251 12/21/22 0255 12/24/22 0028 12/25/22 0359  WBC 13.5* 10.8* 11.4* 10.4   Microbiology No results found for this or any previous visit (from the past 240 hour(s)).   Time coordinating discharge: Over 30 minutes  SIGNED:   Huey Bienenstock, MD  [facility] 12/25/2022, 11:19 AM Pager   If 7PM-7AM, please contact night-coverage [email or web address] Password [email or web address]

## 2022-12-25 NOTE — TOC Transition Note (Signed)
Transition of Care [facility]) - CM/SW Discharge Note   Patient Details  Name: [name] MRN: [id number] Date of Birth: 07-Sep-1950  Transition of Care [facility]) CM/SW Contact:  Gordy Clement, RN Phone Number: 12/25/2022, 8:30 AM   Clinical Narrative:     Patient to dc to home today .  [facility] will be supplying the home medications and also will be delivering a PICC extension to home. Patient  is to have their 12 noon infusion here before DC then Frances Furbish will be at the home tomorrow to begin once daily home infusion (Wife will  assist between [facility] visits. Frances Furbish will also be providing Home Health PT There are no DME recommendations  Wife to transport home.  No additional TOC needs       Barriers to Discharge: Continued Medical Work up   Patient Goals and CMS Choice CMS Medicare.gov Compare Post Acute Care list provided to:: Other (Comment Required) (call to [facility]  for resource) Choice offered to / list presented to : NA (Call to [facility] for resource)  Discharge Placement                         Discharge Plan and Services Additional resources added to the After Visit Summary for   In-house Referral: NA Discharge Planning Services: CM Consult, Mobile Meals, Other - See comment (coordinating with the [place] [facility]) for resources in the home) Post Acute Care Choice: Home Health          DME Arranged: IV pump/equipment DME Agency: Other - Comment Julianne Rice) Date DME Agency Contacted: 12/14/22 Time DME Agency Contacted: [phone] Representative spoke with at DME Agency: Jeri Modena HH Arranged: RN, IV Antibiotics HH Agency: [facility] Date [facility] Agency Contacted: 12/22/22 Time HH Agency Contacted: [phone] Representative spoke with at [facility] Agency: Kandee Keen  Social Determinants of Health (SDOH) Interventions SDOH Screenings   Food Insecurity: No Food Insecurity (12/12/2022)  Housing: Low Risk  (12/12/2022)  Transportation Needs: No Transportation Needs (12/12/2022)  Utilities:  Not At Risk (12/12/2022)  Tobacco Use: Medium Risk (12/14/2022)     Readmission Risk Interventions     No data to display

## 2022-12-29 ENCOUNTER — Encounter (HOSPITAL_COMMUNITY): Payer: Self-pay | Admitting: Emergency Medicine

## 2022-12-29 ENCOUNTER — Other Ambulatory Visit: Payer: Self-pay

## 2022-12-29 ENCOUNTER — Inpatient Hospital Stay (HOSPITAL_COMMUNITY)
Admission: EM | Admit: 2022-12-29 | Discharge: 2023-01-08 | DRG: 314 | Disposition: A | Discharge status: Skilled Nursing Facility | Payer: Medicare HMO | Attending: Internal Medicine | Admitting: Internal Medicine

## 2022-12-29 DIAGNOSIS — Z91040 Latex allergy status: Secondary | ICD-10-CM

## 2022-12-29 DIAGNOSIS — Z885 Allergy status to narcotic agent status: Secondary | ICD-10-CM

## 2022-12-29 DIAGNOSIS — E876 Hypokalemia: Secondary | ICD-10-CM | POA: Diagnosis present

## 2022-12-29 DIAGNOSIS — T826XXA Infection and inflammatory reaction due to cardiac valve prosthesis, initial encounter: Principal | ICD-10-CM | POA: Diagnosis present

## 2022-12-29 DIAGNOSIS — Z823 Family history of stroke: Secondary | ICD-10-CM

## 2022-12-29 DIAGNOSIS — E118 Type 2 diabetes mellitus with unspecified complications: Secondary | ICD-10-CM | POA: Diagnosis present

## 2022-12-29 DIAGNOSIS — I10 Essential (primary) hypertension: Secondary | ICD-10-CM | POA: Diagnosis present

## 2022-12-29 DIAGNOSIS — Z7951 Long term (current) use of inhaled steroids: Secondary | ICD-10-CM

## 2022-12-29 DIAGNOSIS — H9190 Unspecified hearing loss, unspecified ear: Secondary | ICD-10-CM | POA: Diagnosis present

## 2022-12-29 DIAGNOSIS — R296 Repeated falls: Secondary | ICD-10-CM | POA: Diagnosis present

## 2022-12-29 DIAGNOSIS — Z87891 Personal history of nicotine dependence: Secondary | ICD-10-CM

## 2022-12-29 DIAGNOSIS — L299 Pruritus, unspecified: Secondary | ICD-10-CM | POA: Diagnosis not present

## 2022-12-29 DIAGNOSIS — I6523 Occlusion and stenosis of bilateral carotid arteries: Secondary | ICD-10-CM | POA: Diagnosis present

## 2022-12-29 DIAGNOSIS — F4310 Post-traumatic stress disorder, unspecified: Secondary | ICD-10-CM | POA: Diagnosis present

## 2022-12-29 DIAGNOSIS — R001 Bradycardia, unspecified: Secondary | ICD-10-CM | POA: Diagnosis not present

## 2022-12-29 DIAGNOSIS — J4489 Other specified chronic obstructive pulmonary disease: Secondary | ICD-10-CM | POA: Diagnosis present

## 2022-12-29 DIAGNOSIS — N179 Acute kidney failure, unspecified: Secondary | ICD-10-CM | POA: Diagnosis not present

## 2022-12-29 DIAGNOSIS — B954 Other streptococcus as the cause of diseases classified elsewhere: Secondary | ICD-10-CM | POA: Diagnosis present

## 2022-12-29 DIAGNOSIS — I358 Other nonrheumatic aortic valve disorders: Secondary | ICD-10-CM | POA: Diagnosis present

## 2022-12-29 DIAGNOSIS — G894 Chronic pain syndrome: Secondary | ICD-10-CM | POA: Diagnosis present

## 2022-12-29 DIAGNOSIS — E869 Volume depletion, unspecified: Secondary | ICD-10-CM | POA: Diagnosis present

## 2022-12-29 DIAGNOSIS — Z79899 Other long term (current) drug therapy: Secondary | ICD-10-CM

## 2022-12-29 DIAGNOSIS — Z807 Family history of other malignant neoplasms of lymphoid, hematopoietic and related tissues: Secondary | ICD-10-CM

## 2022-12-29 DIAGNOSIS — J449 Chronic obstructive pulmonary disease, unspecified: Secondary | ICD-10-CM | POA: Diagnosis present

## 2022-12-29 DIAGNOSIS — I4819 Other persistent atrial fibrillation: Secondary | ICD-10-CM | POA: Diagnosis present

## 2022-12-29 DIAGNOSIS — I38 Endocarditis, valve unspecified: Principal | ICD-10-CM

## 2022-12-29 DIAGNOSIS — E1169 Type 2 diabetes mellitus with other specified complication: Secondary | ICD-10-CM | POA: Diagnosis present

## 2022-12-29 DIAGNOSIS — Y712 Prosthetic and other implants, materials and accessory cardiovascular devices associated with adverse incidents: Secondary | ICD-10-CM | POA: Diagnosis present

## 2022-12-29 DIAGNOSIS — Z8249 Family history of ischemic heart disease and other diseases of the circulatory system: Secondary | ICD-10-CM

## 2022-12-29 DIAGNOSIS — R5381 Other malaise: Secondary | ICD-10-CM | POA: Insufficient documentation

## 2022-12-29 DIAGNOSIS — I330 Acute and subacute infective endocarditis: Secondary | ICD-10-CM | POA: Diagnosis not present

## 2022-12-29 DIAGNOSIS — F0390 Unspecified dementia without behavioral disturbance: Secondary | ICD-10-CM | POA: Insufficient documentation

## 2022-12-29 DIAGNOSIS — Z8673 Personal history of transient ischemic attack (TIA), and cerebral infarction without residual deficits: Secondary | ICD-10-CM

## 2022-12-29 DIAGNOSIS — E871 Hypo-osmolality and hyponatremia: Secondary | ICD-10-CM | POA: Diagnosis present

## 2022-12-29 DIAGNOSIS — F03A0 Unspecified dementia, mild, without behavioral disturbance, psychotic disturbance, mood disturbance, and anxiety: Secondary | ICD-10-CM | POA: Diagnosis present

## 2022-12-29 DIAGNOSIS — R7881 Bacteremia: Secondary | ICD-10-CM | POA: Diagnosis present

## 2022-12-29 DIAGNOSIS — I878 Other specified disorders of veins: Secondary | ICD-10-CM | POA: Diagnosis present

## 2022-12-29 DIAGNOSIS — I77810 Thoracic aortic ectasia: Secondary | ICD-10-CM | POA: Diagnosis present

## 2022-12-29 DIAGNOSIS — Z751 Person awaiting admission to adequate facility elsewhere: Secondary | ICD-10-CM

## 2022-12-29 DIAGNOSIS — Z96653 Presence of artificial knee joint, bilateral: Secondary | ICD-10-CM | POA: Diagnosis present

## 2022-12-29 DIAGNOSIS — Z95828 Presence of other vascular implants and grafts: Secondary | ICD-10-CM

## 2022-12-29 DIAGNOSIS — E039 Hypothyroidism, unspecified: Secondary | ICD-10-CM | POA: Diagnosis present

## 2022-12-29 DIAGNOSIS — Z91041 Radiographic dye allergy status: Secondary | ICD-10-CM

## 2022-12-29 DIAGNOSIS — Z7901 Long term (current) use of anticoagulants: Secondary | ICD-10-CM

## 2022-12-29 DIAGNOSIS — Z8719 Personal history of other diseases of the digestive system: Secondary | ICD-10-CM

## 2022-12-29 DIAGNOSIS — Z9049 Acquired absence of other specified parts of digestive tract: Secondary | ICD-10-CM

## 2022-12-29 DIAGNOSIS — E782 Mixed hyperlipidemia: Secondary | ICD-10-CM | POA: Diagnosis present

## 2022-12-29 DIAGNOSIS — K219 Gastro-esophageal reflux disease without esophagitis: Secondary | ICD-10-CM | POA: Diagnosis present

## 2022-12-29 DIAGNOSIS — D649 Anemia, unspecified: Secondary | ICD-10-CM | POA: Diagnosis present

## 2022-12-29 DIAGNOSIS — F32A Depression, unspecified: Secondary | ICD-10-CM | POA: Diagnosis present

## 2022-12-29 DIAGNOSIS — Z86718 Personal history of other venous thrombosis and embolism: Secondary | ICD-10-CM

## 2022-12-29 DIAGNOSIS — Z888 Allergy status to other drugs, medicaments and biological substances status: Secondary | ICD-10-CM

## 2022-12-29 DIAGNOSIS — Z87442 Personal history of urinary calculi: Secondary | ICD-10-CM

## 2022-12-29 LAB — CBC WITH DIFFERENTIAL/PLATELET
Abs Immature Granulocytes: 0.04 10*3/uL (ref 0.00–0.07)
Basophils Absolute: 0.1 10*3/uL (ref 0.0–0.1)
Basophils Relative: 1 %
Eosinophils Absolute: 0.2 10*3/uL (ref 0.0–0.5)
Eosinophils Relative: 2 %
HCT: 31.4 % — ABNORMAL LOW (ref 39.0–52.0)
Hemoglobin: 9.6 g/dL — ABNORMAL LOW (ref 13.0–17.0)
Immature Granulocytes: 0 %
Lymphocytes Relative: 23 %
Lymphs Abs: 2.7 10*3/uL (ref 0.7–4.0)
MCH: 24.1 pg — ABNORMAL LOW (ref 26.0–34.0)
MCHC: 30.6 g/dL (ref 30.0–36.0)
MCV: 78.7 fL — ABNORMAL LOW (ref 80.0–100.0)
Monocytes Absolute: 0.8 10*3/uL (ref 0.1–1.0)
Monocytes Relative: 7 %
Neutro Abs: 7.9 10*3/uL — ABNORMAL HIGH (ref 1.7–7.7)
Neutrophils Relative %: 67 %
Platelets: 340 10*3/uL (ref 150–400)
RBC: 3.99 MIL/uL — ABNORMAL LOW (ref 4.22–5.81)
RDW: 17 % — ABNORMAL HIGH (ref 11.5–15.5)
WBC: 11.8 10*3/uL — ABNORMAL HIGH (ref 4.0–10.5)
nRBC: 0 % (ref 0.0–0.2)

## 2022-12-29 LAB — BASIC METABOLIC PANEL
Anion gap: 9 (ref 5–15)
BUN: 14 mg/dL (ref 8–23)
CO2: 19 mmol/L — ABNORMAL LOW (ref 22–32)
Calcium: 7.7 mg/dL — ABNORMAL LOW (ref 8.9–10.3)
Chloride: 106 mmol/L (ref 98–111)
Creatinine, Ser: 0.66 mg/dL (ref 0.61–1.24)
GFR, Estimated: >60 mL/min (ref >60)
Glucose, Bld: 89 mg/dL (ref 70–99)
Potassium: 3.2 mmol/L — ABNORMAL LOW (ref 3.5–5.1)
Sodium: 134 mmol/L — ABNORMAL LOW (ref 135–145)

## 2022-12-29 LAB — LAB REPORT - SCANNED: EGFR: 93

## 2022-12-29 MED ORDER — FENTANYL CITRATE PF 50 MCG/ML IJ SOSY
50.0000 ug | PREFILLED_SYRINGE | Freq: Once | INTRAMUSCULAR | Status: AC
  Administered 2022-12-29: 50 ug via INTRAVENOUS
  Filled 2022-12-29: qty 1

## 2022-12-29 MED ORDER — MORPHINE SULFATE 15 MG PO TABS
15.0000 mg | ORAL_TABLET | ORAL | Status: DC | PRN
  Administered 2022-12-29 – 2023-01-08 (×31): 15 mg via ORAL
  Filled 2022-12-29 (×31): qty 1

## 2022-12-29 MED ORDER — APIXABAN 5 MG PO TABS
5.0000 mg | ORAL_TABLET | Freq: Two times a day (BID) | ORAL | Status: DC
  Administered 2022-12-29 – 2023-01-08 (×20): 5 mg via ORAL
  Filled 2022-12-29 (×20): qty 1

## 2022-12-29 MED ORDER — ONDANSETRON HCL 4 MG PO TABS: 4.0000 mg | ORAL_TABLET | Freq: Four times a day (QID) | ORAL | Status: DC | PRN

## 2022-12-29 MED ORDER — SODIUM CHLORIDE 0.9 % IV SOLN
2.0000 g | Freq: Once | INTRAVENOUS | Status: AC
  Administered 2022-12-29: 2 g via INTRAVENOUS
  Filled 2022-12-29: qty 20

## 2022-12-29 MED ORDER — ACETAMINOPHEN 325 MG PO TABS
650.0000 mg | ORAL_TABLET | Freq: Four times a day (QID) | ORAL | Status: DC | PRN
  Administered 2022-12-29 – 2023-01-04 (×2): 650 mg via ORAL
  Filled 2022-12-29 (×2): qty 2

## 2022-12-29 MED ORDER — VITAMIN D 25 MCG (1000 UNIT) PO TABS
5000.0000 [IU] | ORAL_TABLET | Freq: Every day | ORAL | Status: DC
  Administered 2022-12-30 – 2023-01-08 (×10): 5000 [IU] via ORAL
  Filled 2022-12-29 (×11): qty 5

## 2022-12-29 MED ORDER — LIDOCAINE 5 % EX PTCH
1.0000 | MEDICATED_PATCH | CUTANEOUS | Status: DC
  Administered 2022-12-29 – 2023-01-06 (×3): 1 via TRANSDERMAL
  Filled 2022-12-29 (×7): qty 1

## 2022-12-29 MED ORDER — SODIUM CHLORIDE 0.9 % IV SOLN
2.0000 g | INTRAVENOUS | Status: DC
  Administered 2022-12-30 – 2023-01-08 (×10): 2 g via INTRAVENOUS
  Filled 2022-12-29 (×10): qty 20

## 2022-12-29 MED ORDER — ONDANSETRON HCL 4 MG/2ML IJ SOLN: 4.0000 mg | Freq: Four times a day (QID) | INTRAMUSCULAR | Status: DC | PRN

## 2022-12-29 MED ORDER — MOMETASONE FURO-FORMOTEROL FUM 200-5 MCG/ACT IN AERO
2.0000 | INHALATION_SPRAY | Freq: Two times a day (BID) | RESPIRATORY_TRACT | Status: DC
  Administered 2022-12-30 – 2023-01-08 (×19): 2 via RESPIRATORY_TRACT
  Filled 2022-12-29: qty 8.8

## 2022-12-29 MED ORDER — ROSUVASTATIN CALCIUM 20 MG PO TABS
40.0000 mg | ORAL_TABLET | Freq: Every day | ORAL | Status: DC
  Administered 2022-12-29 – 2023-01-08 (×11): 40 mg via ORAL
  Filled 2022-12-29 (×11): qty 2

## 2022-12-29 MED ORDER — ALBUTEROL SULFATE (2.5 MG/3ML) 0.083% IN NEBU: 3.0000 mL | INHALATION_SOLUTION | Freq: Four times a day (QID) | RESPIRATORY_TRACT | Status: DC | PRN

## 2022-12-29 MED ORDER — NITROGLYCERIN 0.4 MG SL SUBL: 0.4000 mg | SUBLINGUAL_TABLET | SUBLINGUAL | Status: DC | PRN

## 2022-12-29 MED ORDER — ADULT MULTIVITAMIN W/MINERALS CH
1.0000 | ORAL_TABLET | Freq: Every day | ORAL | Status: DC
  Administered 2022-12-29 – 2023-01-07 (×10): 1 via ORAL
  Filled 2022-12-29 (×10): qty 1

## 2022-12-29 MED ORDER — POTASSIUM CHLORIDE CRYS ER 20 MEQ PO TBCR
40.0000 meq | EXTENDED_RELEASE_TABLET | Freq: Four times a day (QID) | ORAL | Status: AC
  Administered 2022-12-29 (×2): 40 meq via ORAL
  Filled 2022-12-29 (×2): qty 2

## 2022-12-29 MED ORDER — ACETAMINOPHEN 650 MG RE SUPP: 650.0000 mg | Freq: Four times a day (QID) | RECTAL | Status: DC | PRN

## 2022-12-29 MED ORDER — SERTRALINE HCL 25 MG PO TABS
150.0000 mg | ORAL_TABLET | Freq: Every day | ORAL | Status: DC
  Administered 2022-12-29 – 2023-01-08 (×11): 150 mg via ORAL
  Filled 2022-12-29 (×9): qty 2
  Filled 2022-12-29: qty 1
  Filled 2022-12-29: qty 2

## 2022-12-29 MED ORDER — ALPRAZOLAM 0.5 MG PO TABS
0.5000 mg | ORAL_TABLET | Freq: Three times a day (TID) | ORAL | Status: DC | PRN
  Administered 2023-01-08: 0.5 mg via ORAL
  Filled 2022-12-29: qty 1

## 2022-12-29 MED ORDER — LOSARTAN POTASSIUM 50 MG PO TABS
100.0000 mg | ORAL_TABLET | Freq: Every day | ORAL | Status: DC
  Administered 2022-12-29 – 2023-01-08 (×10): 100 mg via ORAL
  Filled 2022-12-29 (×11): qty 2

## 2022-12-29 MED ORDER — ENSURE ENLIVE PO LIQD
237.0000 mL | Freq: Two times a day (BID) | ORAL | Status: DC
  Administered 2022-12-30 – 2023-01-08 (×15): 237 mL via ORAL

## 2022-12-29 MED ORDER — CARVEDILOL 12.5 MG PO TABS
12.5000 mg | ORAL_TABLET | Freq: Two times a day (BID) | ORAL | Status: DC
  Administered 2022-12-29 – 2023-01-04 (×9): 12.5 mg via ORAL
  Filled 2022-12-29 (×11): qty 1

## 2022-12-29 NOTE — ED Provider Notes (Signed)
[place] EMERGENCY DEPARTMENT AT [facility] Provider Note   CSN: [id number] Arrival date & time: 12/29/22  1219     History  Chief Complaint  Patient presents with   IV antibiotics    [name] is a 72 y.o. male.  Patient here as they are unable to take care of themselves at home.  They were just discharged with a PICC line for IV antibiotics for endocarditis.  He supposed to be getting Rocephin daily but wife does not feel comfortable doing this after getting some training from home nurses.  Wife has multiple medical problems and overall does not feel comfortable giving him antibiotics after being trained by nurses for the last few days.  Ultimately they have come here because they want him placed in a skilled nursing facility or some other resource to get antibiotics.  He has not been doing well from physical standpoint that he has not doing well with PT.  He is not ambulating much.  He denies any chest pain or shortness of breath or fever.  The history is provided by the patient and a caregiver.       Home Medications Prior to Admission medications   Medication Sig Start Date End Date Taking? Authorizing Provider  albuterol (PROVENTIL) (2.5 MG/3ML) 0.083% nebulizer solution Take 2.5 mg by nebulization every 6 (six) hours as needed for wheezing or shortness of breath.   Yes [provider]  albuterol (VENTOLIN HFA) 108 (90 Base) MCG/ACT inhaler Inhale 1 puff into the lungs every 4 (four) hours as needed for wheezing or shortness of breath.   Yes [provider]  ALPRAZolam Prudy Feeler) 0.5 MG tablet Take 0.5 mg by mouth 3 (three) times daily as needed for anxiety or sleep.   Yes [provider]  apixaban (ELIQUIS) 5 MG TABS tablet Take 5 mg by mouth 2 (two) times daily.   Yes [provider]  carvedilol (COREG) 12.5 MG tablet Take 1 tablet (12.5 mg total) by mouth 2 (two) times daily with a meal. 12/25/22  Yes Elgergawy, Leana Roe, MD   cefTRIAXone (ROCEPHIN) IVPB Inject 2 g into the vein daily. Indication: Strep infantarius TAVR-associated endocarditis First Dose: Yes Last Day of Therapy: 01/21/2023 Labs - Once weekly:  CBC/D and BMP, Labs - Once weekly: ESR and CRP Method of administration: IV Push Method of administration may be changed at the discretion of home infusion pharmacist based upon assessment of the patient and/or caregiver's ability to self-administer the medication ordered. 12/25/22 01/25/23 Yes Elgergawy, Leana Roe, MD  Cholecalciferol (VITAMIN D-3) 125 MCG (5000 UT) TABS Take 5,000 Units by mouth daily.   Yes [provider]  cyclobenzaprine (FLEXERIL) 5 MG tablet Take 5 mg by mouth 3 (three) times daily as needed. 11/12/22  Yes [provider]  EPINEPHrine 0.3 mg/0.3 mL IJ SOAJ injection Inject 0.3 mg into the muscle once as needed for anaphylaxis. 09/09/21  Yes [provider]  ferrous sulfate 325 (65 FE) MG tablet Take 325 mg by mouth daily. 12/07/22  Yes [provider]  fluticasone-salmeterol (ADVAIR) 250-50 MCG/ACT AEPB Inhale 1 puff into the lungs in the morning and at bedtime. Rinse mouth well with water after each use   Yes [provider]  losartan (COZAAR) 25 MG tablet Take 1 tablet (25 mg total) by mouth daily. 12/25/22  Yes Elgergawy, Leana Roe, MD  Magnesium Hydroxide (MILK OF MAGNESIA PO) Take 1 Dose by mouth as needed (constipation). 03/25/17  Yes [provider]  morphine (MSIR) 15 MG tablet Take 15 mg by mouth every 4 (four) hours as needed for severe pain. 09/25/17  Yes [provider]  Multiple Vitamin (MULTIVITAMIN WITH MINERALS) TABS tablet Take 1 tablet by mouth at bedtime.   Yes [provider]  nitroGLYCERIN (NITROSTAT) 0.4 MG SL tablet Place 1 tablet (0.4 mg total) under the tongue every 5 (five) minutes as needed for chest pain. 08/07/19  Yes Janetta Hora, PA-C  polyethylene glycol powder (GLYCOLAX/MIRALAX) 17 GM/SCOOP  powder Take 1 Container by mouth 2 (two) times daily.   Yes [provider]  promethazine (PHENERGAN) 25 MG tablet Take 25 mg by mouth 4 (four) times daily as needed for nausea or vomiting. 03/25/17  Yes [provider]  rosuvastatin (CRESTOR) 40 MG tablet Take 40 mg by mouth daily.   Yes [provider]  sertraline (ZOLOFT) 100 MG tablet Take 150 mg by mouth daily. 09/09/21  Yes [provider]      Allergies    Bee venom, Bystolic [nebivolol hcl], Codeine, Doxycycline, Iodinated contrast media, Iohexol, Levaquin [levofloxacin], Oxycodone hcl, Stadol [butorphanol], Succinylcholine chloride, Tamiflu [oseltamivir phosphate], Atorvastatin, Synvisc [hylan g-f 20], Gemfibrozil, Latex, Lisinopril, Omeprazole, Dilaudid [hydromorphone hcl], Methocarbamol, Niacin, and Pentazocine lactate    Review of Systems   Review of Systems  Physical Exam Updated Vital Signs  ED Triage Vitals  Encounter Vitals Group     BP 12/29/22 1214 (!) 161/85     Systolic BP Percentile --      Diastolic BP Percentile --      Pulse Rate 12/29/22 1214 (!) 55     Resp 12/29/22 1214 17     Temp 12/29/22 1214 98.2 F (36.8 C)     Temp Source 12/29/22 1214 Oral     SpO2 12/29/22 1214 100 %     Weight 12/29/22 1229 179 lb (81.2 kg)     Height 12/29/22 1229 6\' 1"  (1.854 m)     Head Circumference --      Peak Flow --      Pain Score 12/29/22 1228 6     Pain Loc --      Pain Education --      Exclude from Growth Chart --      Physical Exam Vitals and nursing note reviewed.  Constitutional:      General: He is not in acute distress.    Appearance: He is well-developed.  HENT:     Head: Normocephalic and atraumatic.     Nose: Nose normal.     Mouth/Throat:     Mouth: Mucous membranes are moist.  Eyes:     Extraocular Movements: Extraocular movements intact.     Conjunctiva/sclera: Conjunctivae normal.     Pupils: Pupils are equal, round, and reactive to light.  Cardiovascular:      Rate and Rhythm: Normal rate and regular rhythm.     Pulses: Normal pulses.     Heart sounds: No murmur heard. Pulmonary:     Effort: Pulmonary effort is normal. No respiratory distress.     Breath sounds: Normal breath sounds.  Abdominal:     Palpations: Abdomen is soft.     Tenderness: There is no abdominal tenderness.  Musculoskeletal:        General: No swelling.     Cervical back: Neck supple.  Skin:    General: Skin is warm and dry.     Capillary Refill: Capillary refill takes less than 2 seconds.  Neurological:  General: No focal deficit present.     Mental Status: He is alert.  Psychiatric:        Mood and Affect: Mood normal.     ED Results / Procedures / Treatments   Labs (all labs ordered are listed, but only abnormal results are displayed) Labs Reviewed  CBC WITH DIFFERENTIAL/PLATELET - Abnormal; Notable for the following components:      Result Value   WBC 11.8 (*)    RBC 3.99 (*)    Hemoglobin 9.6 (*)    HCT 31.4 (*)    MCV 78.7 (*)    MCH 24.1 (*)    RDW 17.0 (*)    Neutro Abs 7.9 (*)    All other components within normal limits  BASIC METABOLIC PANEL - Abnormal; Notable for the following components:   Sodium 134 (*)    Potassium 3.2 (*)    CO2 19 (*)    Calcium 7.7 (*)    All other components within normal limits  BASIC METABOLIC PANEL  CBC    EKG None  Radiology No results found.  Procedures Procedures    Medications Ordered in ED Medications  morphine (MSIR) tablet 15 mg (has no administration in time range)  carvedilol (COREG) tablet 12.5 mg (has no administration in time range)  nitroGLYCERIN (NITROSTAT) SL tablet 0.4 mg (has no administration in time range)  rosuvastatin (CRESTOR) tablet 40 mg (has no administration in time range)  ALPRAZolam (XANAX) tablet 0.5 mg (has no administration in time range)  sertraline (ZOLOFT) tablet 150 mg (has no administration in time range)  apixaban (ELIQUIS) tablet 5 mg (has no  administration in time range)  multivitamin with minerals tablet 1 tablet (has no administration in time range)  Vitamin D-3 TABS 5,000 Units (has no administration in time range)  albuterol (ACCUNEB) nebulizer solution 0.63 mg (has no administration in time range)  mometasone-formoterol (DULERA) 200-5 MCG/ACT inhaler 2 puff (has no administration in time range)  acetaminophen (TYLENOL) tablet 650 mg (has no administration in time range)    Or  acetaminophen (TYLENOL) suppository 650 mg (has no administration in time range)  ondansetron (ZOFRAN) tablet 4 mg (has no administration in time range)    Or  ondansetron (ZOFRAN) injection 4 mg (has no administration in time range)  cefTRIAXone (ROCEPHIN) 2 g in sodium chloride 0.9 % 100 mL IVPB (has no administration in time range)  losartan (COZAAR) tablet 100 mg (has no administration in time range)  cefTRIAXone (ROCEPHIN) 2 g in sodium chloride 0.9 % 100 mL IVPB (0 g Intravenous Stopped 12/29/22 1336)  fentaNYL (SUBLIMAZE) injection 50 mcg (50 mcg Intravenous Given 12/29/22 1653)    ED Course/ Medical Decision Making/ A&P                                 Medical Decision Making Amount and/or Complexity of Data Reviewed Labs: ordered.  Risk Prescription drug management. Decision regarding hospitalization.   [name] is here due to issues with home health.  Patient unable to get his IV antibiotics as his wife is not comfortable giving them to him.  He is diagnosed with endocarditis and so was to be on IV antibiotics with Rocephin.  Home nurses came out the last few days to help teach the wife but overall she does not feel comfortable.  They also do not think he is doing well with PT and overall would like him  to be placed.  They feel like he should have been placed to facility from the hospital and should not have tried maybe to go home.  Overall basic labs were obtained which were unremarkable.  Case management has talked with family and  ultimately could be difficult placement.  Case management would like to have me reach out to hospital team to see if they can admit him again until he can get placed.  We have placed the PT OT assessment as well so he can continue to get stronger from that standpoint.  I will reach out to hospitalist team to see if they are willing to readmit him to the hospital.  Hospitalist made the patient.  This chart was dictated using voice recognition software.  Despite best efforts to proofread,  errors can occur which can change the documentation meaning.         Final Clinical Impression(s) / ED Diagnoses Final diagnoses:  Endocarditis, unspecified chronicity, unspecified endocarditis type    Rx / DC Orders ED Discharge Orders     None         Virgina Norfolk, DO 12/29/22 1758

## 2022-12-29 NOTE — TOC Initial Note (Signed)
Transition of Care [facility]) - Initial/Assessment Note    Patient Details  Name: [name] MRN: [id number] Date of Birth: 1951-03-23  Transition of Care [facility]) CM/SW Contact:    Susa Simmonds, LCSWA Phone Number: 12/29/2022, 2:45 PM  Clinical Narrative: CSW spoke with patient and wife Gabrael Mares at bedside. Patients wife stated she cannot take care of patient at home. Patients wife stated she felt they were rushed out of the hospital last week. Patients wife stated she wants to know why patient wasn't considered for SNF during his last hospital stay. Patients wife stated patient is unsteady on his feet and uses the bathroom on himself. Patient told CSW that PT with [facility] home health hasn't been out to the home to help him either. Patient has Community education officer and [employer]. Patient prefers to use his [facility] insurance first. Patients wife stated that patient is over the income limit for medicaid. CSW told patient and wife that a PT consult is pending. CSW stated if PT is recommended she will contact the [place] for a SNF request.   CSW spoke with the [facility] and confirmed patients social worker is Delila Pereyra [phone] Ext. [phone], Patient is 70% service connected.                   Expected Discharge Plan: Skilled Nursing Facility Barriers to Discharge: Continued Medical Work up   Patient Goals and CMS Choice Patient states their goals for this hospitalization and ongoing recovery are:: Long term care          Expected Discharge Plan and Services       Living arrangements for the past 2 months: Single Family Home                             HH Agency: [facility]        Prior Living Arrangements/Services Living arrangements for the past 2 months: Single Family Home Lives with:: Spouse Patient language and need for interpreter reviewed:: Yes Do you feel safe going back to the place where you live?: No   Patients wife cannot manage patient at home  Need for  Family Participation in Patient Care: Yes (Comment) Care giver support system in place?: Yes (comment)   Criminal Activity/Legal Involvement Pertinent to Current Situation/Hospitalization: No - Comment as needed  Activities of Daily Living      Permission Sought/Granted   Permission granted to share information with : Yes, Verbal Permission Granted              Emotional Assessment Appearance:: Appears stated age Attitude/Demeanor/Rapport: Engaged Affect (typically observed): Accepting Orientation: : Oriented to Self, Oriented to Place, Oriented to  Time, Oriented to Situation Alcohol / Substance Use: Not Applicable Psych Involvement: No (comment)  Admission diagnosis:  need iv antibotics Patient Active Problem List   Diagnosis Date Noted   Aortic valve endocarditis 12/11/2022   Fever of undetermined origin 12/10/2022   Fall at home, initial encounter 12/10/2022   Hyponatremia 12/10/2022   Elevated brain natriuretic peptide (BNP) level 12/10/2022   Microcytic anemia 12/10/2022   Essential hypertension 12/10/2022   Acquired hypothyroidism 12/10/2022   SIRS (systemic inflammatory response syndrome) (HCC) 12/10/2022   Fever of unknown origin 12/10/2022   Senile purpura (HCC) 02/08/2022   S/P TAVR (transcatheter aortic valve replacement) 08/08/2019   Edema leg from Venous Stasis    Severe aortic stenosis    Chronic post-traumatic stress  disorder (PTSD) 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 06/29/2019   Chronic sinusitis 11/02/2017   Persistent atrial fibrillation (HCC) 07/07/2017   Allergy to intravenous contrast 10/15/2016   COPD mixed type (HCC) 07/08/2016   Bilateral lower extremity edema 02/26/2013   Back pain, chronic, followed at pain clinic 11/25/2011   Incisional hernia 09/21/2011   Umbilical hernia 05/07/2011   Lung nodule 01/22/2009   Constipation 07/31/2008   Diabetes mellitus type 2, controlled, with complications (HCC) 01/16/2008   NEPHROLITHIASIS, HX OF 07/22/2007    Mixed hyperlipidemia 01/21/2007   Other idiopathic peripheral autonomic neuropathy (HCC) 01/21/2007   Hypertensive heart disease with chronic diastolic congestive heart failure (HCC) 01/21/2007   Allergic rhinitis 01/21/2007   GERD 01/21/2007   DIVERTICULOSIS, COLON 01/21/2007   History of stroke 01/21/2007   ESOPHAGEAL STRICTURE 02/02/2006   PCP:  [name], MD Pharmacy:   [facility] - [place], [place] - [address] [place] [address] Phone: [phone] Fax: [phone]  [facility] [address] [place] [address] Phone: [phone] Fax: [phone]     Social Determinants of Health (SDOH) Social History: SDOH Screenings   Food Insecurity: No Food Insecurity (12/12/2022)  Housing: Low Risk  (12/12/2022)  Transportation Needs: No Transportation Needs (12/12/2022)  Utilities: Not At Risk (12/12/2022)  Tobacco Use: Medium Risk (12/29/2022)   SDOH Interventions:     Readmission Risk Interventions     No data to display

## 2022-12-29 NOTE — Evaluation (Signed)
Physical Therapy Evaluation Patient Details Name: [name] MRN: [id number] DOB: 1951/01/06 Today's Date: 12/29/2022  History of Present Illness  Pt is a 72 y.o. M who presents 12/29/2022 with inability to take care of themselves at home. Pt just discharged with a PICC line for IV antibiotics for endocarditis. Significant PMH: COPD, mild dementia, CVA, idiopathic peripheral autonomic neuropathy ([place]), PAF, PTSD, TAVR, seizure.  Clinical Impression  Pt with re-admission to hospital following recent stay. Reports 4 falls at home since discharge. Pt presents with RUE weakness, functional weakness, impaired standing balance, decreased activity tolerance. Pt requiring moderate assist for bed mobility and min guard assist transfers/limited ambulation. Pt ambulating 12 ft with a walker. Further distance limited by BLE pain and pt feeling like his "legs were going to buckle." Pt spouse is unable to provide physical assist to pt. Recommend continued inpatient follow up therapy, <3 hours/day to address deficits, maximize functional mobility and decrease caregiver burden.       If plan is discharge home, recommend the following: A little help with walking and/or transfers;A little help with bathing/dressing/bathroom;Assistance with cooking/housework;Assist for transportation;Help with stairs or ramp for entrance   Can travel by private vehicle   Yes    Equipment Recommendations None recommended by PT  Recommendations for Other Services       Functional Status Assessment Patient has had a recent decline in their functional status and demonstrates the ability to make significant improvements in function in a reasonable and predictable amount of time.     Precautions / Restrictions Precautions Precautions: Fall Restrictions Weight Bearing Restrictions: No      Mobility  Bed Mobility Overal bed mobility: Needs Assistance Bed Mobility: Supine to Sit, Sit to Supine     Supine to  sit: Mod assist Sit to supine: Min guard   General bed mobility comments: ModA to elevate trunk to sitting position. Min guard to return to supine    Transfers Overall transfer level: Needs assistance Equipment used: Rolling walker (2 wheels) Transfers: Sit to/from Stand Sit to Stand: Min guard           General transfer comment: Min guard to rise to standing from stretcher    Ambulation/Gait Ambulation/Gait assistance: Min guard Gait Distance (Feet): 12 Feet Assistive device: Rolling walker (2 wheels) Gait Pattern/deviations: Step-through pattern, Trunk flexed Gait velocity: slow pace Gait velocity interpretation: <1.8 ft/sec, indicate of risk for recurrent falls   General Gait Details: Min guard for safety, distance limited due to pain and pt feeling like his knees were about to buckle  Stairs            Wheelchair Mobility     Tilt Bed    Modified Rankin (Stroke Patients Only)       Balance Overall balance assessment: Needs assistance Sitting-balance support: Feet supported, No upper extremity supported Sitting balance-Leahy Scale: Good     Standing balance support: Bilateral upper extremity supported, Reliant on assistive device for balance Standing balance-Leahy Scale: Poor                               Pertinent Vitals/Pain Pain Assessment Pain Assessment: Faces Faces Pain Scale: Hurts little more Pain Location: back, BLE's Pain Descriptors / Indicators: Aching Pain Intervention(s): Monitored during session    Home Living Family/patient expects to be discharged to:: Private residence Living Arrangements: Spouse/significant other Available Help at Discharge: Family Type of Home: House Home Access: Ramped  entrance       Home Layout: One level Home Equipment: Agricultural consultant (2 wheels);Rollator (4 wheels)      Prior Function Prior Level of Function : Independent/Modified Independent             Mobility Comments:  ambulates with RW and has frequent falls       Hand Dominance        Extremity/Trunk Assessment   Upper Extremity Assessment Upper Extremity Assessment: RUE deficits/detail;LUE deficits/detail RUE Deficits / Details: Grossly 3+/5 LUE Deficits / Details: Grossly 4/5    Lower Extremity Assessment Lower Extremity Assessment: RLE deficits/detail;LLE deficits/detail RLE Deficits / Details: Grossly 4/5 RLE Sensation: history of peripheral neuropathy LLE Deficits / Details: Grossly 4/5 LLE Sensation: history of peripheral neuropathy    Cervical / Trunk Assessment Cervical / Trunk Assessment: Kyphotic  Communication   Communication: No difficulties  Cognition Arousal/Alertness: Awake/alert Behavior During Therapy: WFL for tasks assessed/performed Overall Cognitive Status: History of cognitive impairments - at baseline                                 General Comments: Hx mild dementia        General Comments      Exercises     Assessment/Plan    PT Assessment Patient needs continued PT services  PT Problem List Decreased strength;Decreased activity tolerance;Decreased balance;Decreased mobility       PT Treatment Interventions Gait training;Functional mobility training;Therapeutic activities;Therapeutic exercise;Balance training;Patient/family education    PT Goals (Current goals can be found in the Care Plan section)  Acute Rehab PT Goals Patient Stated Goal: agreeable to rehab PT Goal Formulation: With patient/family Time For Goal Achievement: 01/12/23 Potential to Achieve Goals: Good    Frequency Min 1X/week     Co-evaluation               AM-PAC PT "6 Clicks" Mobility  Outcome Measure Help needed turning from your back to your side while in a flat bed without using bedrails?: A Little Help needed moving from lying on your back to sitting on the side of a flat bed without using bedrails?: A Lot Help needed moving to and from a bed to  a chair (including a wheelchair)?: A Little Help needed standing up from a chair using your arms (e.g., wheelchair or bedside chair)?: A Little Help needed to walk in hospital room?: A Little Help needed climbing 3-5 steps with a railing? : Total 6 Click Score: 15    End of Session Equipment Utilized During Treatment: Gait belt Activity Tolerance: Patient tolerated treatment well Patient left: in bed;with call bell/phone within reach;with family/visitor present   PT Visit Diagnosis: Repeated falls (R29.6);Other abnormalities of gait and mobility (R26.89);History of falling (Z91.81);Unsteadiness on feet (R26.81)    Time: 0981-1914 PT Time Calculation (min) (ACUTE ONLY): 20 min   Charges:   PT Evaluation $PT Eval Low Complexity: 1 Low   PT General Charges $$ ACUTE PT VISIT: 1 Visit         Lillia Pauls, PT, DPT Acute Rehabilitation Services Office [phone]   Norval Morton 12/29/2022, 5:11 PM

## 2022-12-29 NOTE — Progress Notes (Addendum)
CSW obtained PASRR for the first time for this patient. Patient is still awaiting provider as well PT.  [phone] [id number]

## 2022-12-29 NOTE — ED Notes (Signed)
ED TO INPATIENT HANDOFF REPORT  ED Nurse Name and Phone #: [phone]  S Name/Age/Gender [name] 72 y.o. male Room/Bed: 046C/046C  Code Status   Code Status: Full Code  Home/SNF/Other Home Patient oriented to: self, place, time, and situation Is this baseline? Yes   Triage Complete: Triage complete  Chief Complaint Endocarditis [I38]  Triage Note Per [facility] pt coming from home- had new PICC line placed yesterday. States he is supposed to come here for more IV antibiotics for his heart.    Allergies Allergies  Allergen Reactions   Bee Venom Anaphylaxis   Bystolic [Nebivolol Hcl] Other (See Comments)    Nightmares, flashbacks (PTSD)   Codeine Shortness Of Breath and Rash   Doxycycline Shortness Of Breath and Rash   Iodinated Contrast Media Shortness Of Breath and Anaphylaxis    Lost consciousness/difficulty breathing Omni - Paque Contrast    Iohexol Hives, Shortness Of Breath and Rash    Neck and Torso erythemia IVP-DYE >> SHORTNESS OF BREATH    Levaquin [Levofloxacin] Hives and Shortness Of Breath   Oxycodone Hcl Shortness Of Breath, Swelling and Rash   Stadol [Butorphanol] Shortness Of Breath   Succinylcholine Chloride Shortness Of Breath, Nausea Only, Rash and Other (See Comments)    Difficulty breathing    Tamiflu [Oseltamivir Phosphate]     UNSPECIFIED REACTION    Atorvastatin     myalgias   Synvisc [Hylan G-F 20] Hives and Rash   Gemfibrozil     Other reaction(s): Serum cholesterol raised   Latex Other (See Comments)    blister   Lisinopril     Other reaction(s): Cough   Omeprazole    Dilaudid [Hydromorphone Hcl] Nausea Only and Rash    Aggressive    Methocarbamol Nausea Only and Rash   Niacin Rash   Pentazocine Lactate Rash    rash    Level of Care/Admitting Diagnosis ED Disposition     ED Disposition  Admit   Condition  --   Comment  Hospital Area: [facility] [[id number]]  Level of Care: Med-Surg [16]  May  place patient in observation at [facility] or [facility] if equivalent level of care is available:: No  Covid Evaluation: Asymptomatic - no recent exposure (last 10 days) testing not required  Diagnosis: Endocarditis [454098]  Admitting Physician: Chiquita Loth  Attending Physician: Starleen Arms [[id number]]  Bed request comments: 5 west bed if available          B Medical/Surgery History Past Medical History:  Diagnosis Date   Anemia    Asthma    Back pain, chronic, followed at pain clinic 11/25/2011   Carotid artery stenosis 09/06/2014   By Dopplers in September 2017: Right ICA 40-59%.  Less than 40% left ICA.   Colon polyps    COPD (chronic obstructive pulmonary disease) (HCC)    Dementia (HCC)    Mild   Depression    Dyslipidemia (high LDL; low HDL)    statin intolerant; on fibrate   Edema leg from Venous Stasis    Venous stasis :wears compression stockings; 08/2012 dopplers - no DVT or thrombophlebitis; mild R Popliteal V reflux - no VNUS ablation candidate   GERD (gastroesophageal reflux disease)    Hearing loss    History of CVA (cerebrovascular accident)    History of diabetes mellitus    History of DVT (deep vein thrombosis)    Hypertension    very labile   Nephrolithiasis  Osteoarthritis    Other idiopathic peripheral autonomic neuropathy    Agent orange   PAF (paroxysmal atrial fibrillation) (HCC)    PONV (postoperative nausea and vomiting)    PTSD (post-traumatic stress disorder) 07/24/2015   per patient approach from foot of bed to awake; do not apply any contricting pressure, also avoid approaching from behind with any loud noises    S/P TAVR (transcatheter aortic valve replacement) 08/08/2019   s/p TAVR w/ a 29 mm Edwards Sapien 3 THV via the TF approach by Drs Excell Seltzer and Cornelius Moras   Seizure [facility])    Severe aortic stenosis    Skull fracture (HCC)    Small bowel obstruction (HCC) 1990s, 2001, 2015   s/p multiple bowel surgeries; from war  wounds   Past Surgical History:  Procedure Laterality Date   APPENDECTOMY  1958   per patient   Carotid Dopplers  01/2016   Stable. RICA - slight progression from<40% to 40-59%.  Otherwise stable bilateral carotids and subclavian arteries. Stable LICA   COLONOSCOPY     DENTAL SURGERY  01/14/2017   5 extractions in preparation for LTKA on 01-25-17; also was started on amoxicillin x 7days; has since completed therapy    exploratory laparotomy with extensive lysis of adhesions  10/1999   JOINT REPLACEMENT     right knee replaced 2x   LEFT HEART CATHETERIZATION WITH CORONARY ANGIOGRAM N/A 11/26/2011   WNL Runell Gess, MD   NM MYOVIEW LTD  08/2011   Negative lexiscan myoview: No ischemia or infarction   RIGHT/LEFT HEART CATH AND CORONARY ANGIOGRAPHY N/A 10/22/2016   Procedure: Right/Left Heart Cath and Coronary Angiography;  Surgeon: Marykay Lex, MD;  Location: [facility] INVASIVE CV LAB:: Angiographically normal/minimal CAD with tortuous coronary arteries.  Mild pulmonary pretension secondary to severely elevated LVEDP and systemic hypertension.  Mild-moderate aortic valve stenosis with mean gradient 21 mmHg.   RIGHT/LEFT HEART CATH AND CORONARY ANGIOGRAPHY N/A 07/13/2019   Procedure: RIGHT/LEFT HEART CATH AND CORONARY ANGIOGRAPHY;  Surgeon: Marykay Lex, MD;  Location: [facility] INVASIVE CV LAB;  Service: Cardiovascular;; : Angiographically normal coronary arteries.  Hemodynamic findings consistent with severe stenosis.  Normal RHC Pressures.   small bowel obstruction  1996, 1999, 2001, 2015   TEE WITHOUT CARDIOVERSION N/A 08/08/2019   Procedure: TRANSESOPHAGEAL ECHOCARDIOGRAM (TEE);  Surgeon: Tonny Bollman, MD;  Location: [facility] INVASIVE CV LAB;  Service: Open Heart Surgery;  Laterality: N/A;   TEE WITHOUT CARDIOVERSION N/A 12/14/2022   Procedure: TRANSESOPHAGEAL ECHOCARDIOGRAM;  Surgeon: Chrystie Nose, MD;  Location: [facility] INVASIVE CV LAB;  Service: Cardiovascular;  Laterality: N/A;    TONSILLECTOMY  1960   per patient   TOTAL KNEE ARTHROPLASTY     TOTAL KNEE ARTHROPLASTY Left 01/25/2017   Procedure: LEFT TOTAL KNEE ARTHROPLASTY;  Surgeon: Ollen Gross, MD;  Location: [facility] ORS;  Service: Orthopedics;  Laterality: Left;  Adductor Block   TRANSCATHETER AORTIC VALVE REPLACEMENT, TRANSFEMORAL N/A 08/08/2019   Procedure: TRANSCATHETER AORTIC VALVE REPLACEMENT, TRANSFEMORAL;  Surgeon: Tonny Bollman, MD;  Location: [facility] INVASIVE CV LAB;  Service: Open Heart Surgery;  Laterality: N/A;   TRANSTHORACIC ECHOCARDIOGRAM  06/07/2019    Severe calcific aortic stenosis-mean gradient up to 49 mmHg the peak of 79 mmHg.Mild-Mod AI.  Normal LV size and function.  EF 65 to 70%.  Mild concentric LVH with GR 1 DD.  Biatrial size-normal.  Moderate mitral valve calcification with mild MAC and mild MR -> compared to June 2020-increase in both gradients from mean 42 mmHg and  peak 66 mmHg   TRANSTHORACIC ECHOCARDIOGRAM  07/2019   a) post TAVR echo 08/09/2019:POD #1 - EF 60-65%, nl fxn TAVR with a mAVg of 14 mm Hg and no PVL.;;  09/07/2019:  o 1 month echo showed EF 65%, normally functioning TAVR with a mean gradient of 13 mm Hg and no PVL.   TRANSTHORACIC ECHOCARDIOGRAM  09/04/2020   1 year post TAVR:  EF 70 to 75%.  Hyperdynamic LV function.  GR 1 DD-mild LA dilation.[name]  No RWMA.  Normal RV function.  Unable to assess PAP-mildly elevated RAP.  TAVR valve in place.  Mean gradient 11 mmHg no PVL.  Mild ascending aortic dilation.  No change from previous echo   WRIST FRACTURE SURGERY       A IV Location/Drains/Wounds Patient Lines/Drains/Airways Status     Active Line/Drains/Airways     Name Placement date Placement time Site Days   Peripheral IV 12/29/22 22 G Anterior;Left;Proximal Forearm 12/29/22  1304  Forearm  less than 1   PICC Single Lumen 12/19/22 Right Brachial 42 cm 0 cm 12/19/22  0837  Brachial  10   Wound 11/25/11 Other (Comment) Abdomen 11/25/11  1900  Abdomen  4052   Wound 11/25/11  Abrasion(s) Elbow Left 11/25/11  1902  Elbow  4052   Wound 11/25/11 Other (Comment) Finger (Comment which one) Right 11/25/11  1903  Finger (Comment which one)  4052            Intake/Output Last 24 hours  Intake/Output Summary (Last 24 hours) at 12/29/2022 1931 Last data filed at 12/29/2022 1336 Gross per 24 hour  Intake 100 ml  Output --  Net 100 ml    Labs/Imaging Results for orders placed or performed during the hospital encounter of 12/29/22 (from the past 48 hour(s))  CBC with Differential     Status: Abnormal   Collection Time: 12/29/22  4:08 PM  Result Value Ref Range   WBC 11.8 (H) 4.0 - 10.5 K/uL   RBC 3.99 (L) 4.22 - 5.81 MIL/uL   Hemoglobin 9.6 (L) 13.0 - 17.0 g/dL   HCT 95.6 (L) 21.3 - 08.6 %   MCV 78.7 (L) 80.0 - 100.0 fL   MCH 24.1 (L) 26.0 - 34.0 pg   MCHC 30.6 30.0 - 36.0 g/dL   RDW 57.8 (H) 46.9 - 62.9 %   Platelets 340 150 - 400 K/uL   nRBC 0.0 0.0 - 0.2 %   Neutrophils Relative % 67 %   Neutro Abs 7.9 (H) 1.7 - 7.7 K/uL   Lymphocytes Relative 23 %   Lymphs Abs 2.7 0.7 - 4.0 K/uL   Monocytes Relative 7 %   Monocytes Absolute 0.8 0.1 - 1.0 K/uL   Eosinophils Relative 2 %   Eosinophils Absolute 0.2 0.0 - 0.5 K/uL   Basophils Relative 1 %   Basophils Absolute 0.1 0.0 - 0.1 K/uL   Immature Granulocytes 0 %   Abs Immature Granulocytes 0.04 0.00 - 0.07 K/uL    Comment: Performed at [facility], [address] [place] [address], [place], [place] [address]  Basic metabolic panel     Status: Abnormal   Collection Time: 12/29/22  4:08 PM  Result Value Ref Range   Sodium 134 (L) 135 - 145 mmol/L   Potassium 3.2 (L) 3.5 - 5.1 mmol/L   Chloride 106 98 - 111 mmol/L   CO2 19 (L) 22 - 32 mmol/L   Glucose, Bld 89 70 - 99 mg/dL    Comment: Glucose  reference range applies only to samples taken after fasting for at least 8 hours.   BUN 14 8 - 23 mg/dL   Creatinine, Ser 1.02 0.61 - 1.24 mg/dL   Calcium 7.7 (L) 8.9 - 10.3 mg/dL   GFR, Estimated >72 >53 mL/min    Comment:  (NOTE) Calculated using the CKD-EPI Creatinine Equation (2021)    Anion gap 9 5 - 15    Comment: Performed at [facility], [address], [place], [place] [address]   No results found.  Pending Labs Unresulted Labs (From admission, onward)     Start     Ordered   12/30/22 0500  Basic metabolic panel  Tomorrow morning,   R        12/29/22 1758   12/30/22 0500  CBC  Tomorrow morning,   R        12/29/22 1758            Vitals/Pain Today's Vitals   12/29/22 1653 12/29/22 1800 12/29/22 1823 12/29/22 1900  BP:  (!) 167/84 (!) 177/81 (!) 180/87  Pulse:  (!) 53 (!) 55 (!) 53  Resp:   16   Temp:   98.5 F (36.9 C)   TempSrc:      SpO2:  99%  100%  Weight:      Height:      PainSc: 8        Isolation Precautions No active isolations  Medications Medications  morphine (MSIR) tablet 15 mg (has no administration in time range)  carvedilol (COREG) tablet 12.5 mg (12.5 mg Oral Given 12/29/22 1858)  nitroGLYCERIN (NITROSTAT) SL tablet 0.4 mg (has no administration in time range)  rosuvastatin (CRESTOR) tablet 40 mg (40 mg Oral Given 12/29/22 1859)  ALPRAZolam (XANAX) tablet 0.5 mg (has no administration in time range)  sertraline (ZOLOFT) tablet 150 mg (150 mg Oral Given 12/29/22 1857)  apixaban (ELIQUIS) tablet 5 mg (has no administration in time range)  multivitamin with minerals tablet 1 tablet (has no administration in time range)  cholecalciferol (VITAMIN D3) 25 MCG (1000 UNIT) tablet 5,000 Units (has no administration in time range)  albuterol (PROVENTIL) (2.5 MG/3ML) 0.083% nebulizer solution 3 mL (has no administration in time range)  mometasone-formoterol (DULERA) 200-5 MCG/ACT inhaler 2 puff (has no administration in time range)  acetaminophen (TYLENOL) tablet 650 mg (has no administration in time range)    Or  acetaminophen (TYLENOL) suppository 650 mg (has no administration in time range)  ondansetron (ZOFRAN) tablet 4 mg (has no administration in time  range)    Or  ondansetron (ZOFRAN) injection 4 mg (has no administration in time range)  cefTRIAXone (ROCEPHIN) 2 g in sodium chloride 0.9 % 100 mL IVPB (has no administration in time range)  losartan (COZAAR) tablet 100 mg (100 mg Oral Given 12/29/22 1858)  potassium chloride SA (KLOR-CON M) CR tablet 40 mEq (40 mEq Oral Given 12/29/22 1859)  cefTRIAXone (ROCEPHIN) 2 g in sodium chloride 0.9 % 100 mL IVPB (0 g Intravenous Stopped 12/29/22 1336)  fentaNYL (SUBLIMAZE) injection 50 mcg (50 mcg Intravenous Given 12/29/22 1653)    Mobility walks     Focused Assessments IV ABX via PICC LINE for Endocarditis could not get everyday home health infusion nurse   R Recommendations: See Admitting Provider Note  Report given to:   Additional Notes: Pt a/o x 4 Picc line placed today in Left FA for iv abx for endocarditis pt here because he could not get daily infusion home  health care nurse to give abx uses a walker to ambulate at home Bp high hx htn just got orders and gave BP meds so it should be coming down here shortly

## 2022-12-29 NOTE — Care Management (Addendum)
Transition of Care [facility]) - Emergency Department Mini Assessment   Patient Details  Name: [name] MRN: [id number] Date of Birth: 04-30-1951  Transition of Care [facility]) CM/SW Contact:    Lockie Pares, RN Phone Number: 12/29/2022, 1:06 PM   Clinical Narrative:  Patient presented to the ED for antibiotic. He was DC two days ago and set up with [facility] home health , as well as [facility] for antibiotics. Touched base with [name] from [facility], he states the patient cannot self, dose has dementia and his wife cannot dose as she is in treatment for cancer.[facility] nurse was the one who told him to go to the ED [facility] could see him twice a week, but he would have to pya out of pocket  to [facility] or [facility] for daily nursing. IV abx end on 8/23. 1350 Spoke with wife, she stated when they were educated  for IV infusion Sunday she knew it was going to be challenging and did not "want to mess it up"as there were too many things to do that I could not do". MD came in so she wanted to get off phone to talk with them. She will call this RNCM back. 1419 [name] from social work into speak to [name].  Spoke to [facility] previous CM when patient hospitalized. The patient received  set up through infusions from the [place] and [facility] the infusion nurse is who sent him to the ED. [name] from the [place] [phone] can assist with placement . ED Mini Assessment: What brought you to the Emergency Department? : for antibiotc  Barriers to Discharge: Continued Medical Work up  [employer] interventions: Communication with team.          Patient Contact and Communications        ,                 Admission diagnosis:  need iv antibotics Patient Active Problem List   Diagnosis Date Noted   Aortic valve endocarditis 12/11/2022   Fever of undetermined origin 12/10/2022   Fall at home, initial encounter 12/10/2022   Hyponatremia 12/10/2022   Elevated brain natriuretic peptide (BNP)  level 12/10/2022   Microcytic anemia 12/10/2022   Essential hypertension 12/10/2022   Acquired hypothyroidism 12/10/2022   SIRS (systemic inflammatory response syndrome) (HCC) 12/10/2022   Fever of unknown origin 12/10/2022   Senile purpura (HCC) 02/08/2022   S/P TAVR (transcatheter aortic valve replacement) 08/08/2019   Edema leg from Venous Stasis    Severe aortic stenosis    Chronic post-traumatic stress disorder (PTSD) 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 06/29/2019   Chronic sinusitis 11/02/2017   Persistent atrial fibrillation (HCC) 07/07/2017   Allergy to intravenous contrast 10/15/2016   COPD mixed type (HCC) 07/08/2016   Bilateral lower extremity edema 02/26/2013   Back pain, chronic, followed at pain clinic 11/25/2011   Incisional hernia 09/21/2011   Umbilical hernia 05/07/2011   Lung nodule 01/22/2009   Constipation 07/31/2008   Diabetes mellitus type 2, controlled, with complications (HCC) 01/16/2008   NEPHROLITHIASIS, HX OF 07/22/2007   Mixed hyperlipidemia 01/21/2007   Other idiopathic peripheral autonomic neuropathy (HCC) 01/21/2007   Hypertensive heart disease with chronic diastolic congestive heart failure (HCC) 01/21/2007   Allergic rhinitis 01/21/2007   GERD 01/21/2007   DIVERTICULOSIS, COLON 01/21/2007   History of stroke 01/21/2007   ESOPHAGEAL STRICTURE 02/02/2006   PCP:  William Davenport, MD Pharmacy:   [facility] [place], [place] - [address]  [place] [address] Phone: [phone] Fax: [phone]  [facility] [address] [place] [address] Phone: [phone] Fax: [phone]

## 2022-12-29 NOTE — ED Provider Notes (Incomplete)
EMERGENCY DEPARTMENT AT [facility] Provider Note   CSN: [id number] Arrival date & time: 12/29/22  1219     History {Add pertinent medical, surgical, social history, OB history to HPI:1} Chief Complaint  Patient presents with   IV antibiotics    [name] is a 72 y.o. male.  HPI     72 year old male with a history of TAVR, COPD, mild dementia, GERD, history of CVA, diabetes, hypertension, atrial fibrillation on anticoagulation, hospitalization secondary to fall, found to have blood cultures positive for Streptococcus and workup significant for a TAVR endocarditis for which he was discharged on Rocephin.  Past Medical History:  Diagnosis Date   Anemia    Asthma    Back pain, chronic, followed at pain clinic 11/25/2011   Carotid artery stenosis 09/06/2014   By Dopplers in September 2017: Right ICA 40-59%.  Less than 40% left ICA.   Colon polyps    COPD (chronic obstructive pulmonary disease) (HCC)    Dementia (HCC)    Mild   Depression    Dyslipidemia (high LDL; low HDL)    statin intolerant; on fibrate   Edema leg from Venous Stasis    Venous stasis :wears compression stockings; 08/2012 dopplers - no DVT or thrombophlebitis; mild R Popliteal V reflux - no VNUS ablation candidate   GERD (gastroesophageal reflux disease)    Hearing loss    History of CVA (cerebrovascular accident)    History of diabetes mellitus    History of DVT (deep vein thrombosis)    Hypertension    very labile   Nephrolithiasis    Osteoarthritis    Other idiopathic peripheral autonomic neuropathy    Agent orange   PAF (paroxysmal atrial fibrillation) (HCC)    PONV (postoperative nausea and vomiting)    PTSD (post-traumatic stress disorder) 07/24/2015   per patient approach from foot of bed to awake; do not apply any contricting pressure, also avoid approaching from behind with any loud noises    S/P TAVR (transcatheter aortic valve replacement) 08/08/2019   s/p TAVR  w/ a 29 mm Edwards Sapien 3 THV via the TF approach by Drs Excell Seltzer and Cornelius Moras   Seizure [facility])    Severe aortic stenosis    Skull fracture (HCC)    Small bowel obstruction (HCC) 1990s, 2001, 2015   s/p multiple bowel surgeries; from war wounds     Home Medications Prior to Admission medications   Medication Sig Start Date End Date Taking? Authorizing Provider  albuterol (ACCUNEB) 0.63 MG/3ML nebulizer solution Take 1 ampule by nebulization every 6 (six) hours as needed for wheezing or shortness of breath.     [provider]  albuterol (VENTOLIN HFA) 108 (90 Base) MCG/ACT inhaler Inhale 1 puff into the lungs every 4 (four) hours as needed for wheezing or shortness of breath.    [provider]  ALPRAZolam Prudy Feeler) 0.5 MG tablet Take 0.5 mg by mouth 3 (three) times daily as needed for anxiety or sleep.    [provider]  apixaban (ELIQUIS) 5 MG TABS tablet Take 5 mg by mouth 2 (two) times daily.    [provider]  beclomethasone (BECONASE-AQ) 42 MCG/SPRAY nasal spray Place 1 spray into both nostrils daily.     [provider]  carvedilol (COREG) 12.5 MG tablet Take 1 tablet (12.5 mg total) by mouth 2 (two) times daily with a meal. 12/25/22   Elgergawy, Leana Roe, MD  cefTRIAXone (ROCEPHIN) IVPB Inject 2 g into the  vein daily. Indication: Strep infantarius TAVR-associated endocarditis First Dose: Yes Last Day of Therapy: 01/21/2023 Labs - Once weekly:  CBC/D and BMP, Labs - Once weekly: ESR and CRP Method of administration: IV Push Method of administration may be changed at the discretion of home infusion pharmacist based upon assessment of the patient and/or caregiver's ability to self-administer the medication ordered. 12/25/22 01/25/23  Elgergawy, Leana Roe, MD  Cholecalciferol (VITAMIN D-3) 125 MCG (5000 UT) TABS Take 5,000 Units by mouth daily.    [provider]  cyclobenzaprine (FLEXERIL) 5 MG tablet Take 5 mg by mouth 3 (three) times daily  as needed. 11/12/22   [provider]  EPINEPHrine 0.3 mg/0.3 mL IJ SOAJ injection Inject 0.3 mg into the muscle once as needed for anaphylaxis. 09/09/21   [provider]  ferrous sulfate 325 (65 FE) MG tablet Take 325 mg by mouth daily. 12/07/22   [provider]  fluticasone-salmeterol (ADVAIR) 250-50 MCG/ACT AEPB Inhale 1 puff into the lungs in the morning and at bedtime. Rinse mouth well with water after each use    [provider]  losartan (COZAAR) 25 MG tablet Take 1 tablet (25 mg total) by mouth daily. 12/25/22   Elgergawy, Leana Roe, MD  Magnesium Hydroxide (MILK OF MAGNESIA PO) Take 1 Dose by mouth as needed (constipation). 03/25/17   [provider]  morphine (MSIR) 15 MG tablet Take 15 mg by mouth every 4 (four) hours as needed for severe pain. 09/25/17   [provider]  Multiple Vitamin (MULTIVITAMIN WITH MINERALS) TABS tablet Take 1 tablet by mouth at bedtime.    [provider]  nitroGLYCERIN (NITROSTAT) 0.4 MG SL tablet Place 1 tablet (0.4 mg total) under the tongue every 5 (five) minutes as needed for chest pain. 08/07/19   Janetta Hora, PA-C  polyethylene glycol powder (GLYCOLAX/MIRALAX) 17 GM/SCOOP powder Take 1 Container by mouth 2 (two) times daily.    [provider]  promethazine (PHENERGAN) 25 MG tablet Take 25 mg by mouth 4 (four) times daily as needed for nausea or vomiting. 03/25/17   [provider]  rosuvastatin (CRESTOR) 40 MG tablet Take 40 mg by mouth daily.    [provider]  sertraline (ZOLOFT) 100 MG tablet Take 150 mg by mouth daily. 09/09/21   [provider]      Allergies    Bee venom, Bystolic [nebivolol hcl], Codeine, Doxycycline, Iodinated contrast media, Iohexol, Levaquin [levofloxacin], Oxycodone hcl, Stadol [butorphanol], Succinylcholine chloride, Tamiflu [oseltamivir phosphate], Atorvastatin, Synvisc [hylan g-f 20], Gemfibrozil, Latex, Lisinopril,  Omeprazole, Dilaudid [hydromorphone hcl], Methocarbamol, Niacin, and Pentazocine lactate    Review of Systems   Review of Systems  Physical Exam Updated Vital Signs BP (!) 161/85 (BP Location: Left Arm)   Pulse (!) 55   Temp 98.2 F (36.8 C) (Oral)   Resp 17   Ht 6\' 1"  (1.854 m)   Wt 81.2 kg   SpO2 100%   BMI 23.62 kg/m  Physical Exam  ED Results / Procedures / Treatments   Labs (all labs ordered are listed, but only abnormal results are displayed) Labs Reviewed - No data to display  EKG None  Radiology No results found.  Procedures Procedures  {Document cardiac monitor, telemetry assessment procedure when appropriate:1}  Medications Ordered in ED Medications  cefTRIAXone (ROCEPHIN) 2 g in sodium chloride 0.9 % 100 mL IVPB (has no administration in time range)    ED Course/ Medical Decision Making/ A&P   {  Click here for ABCD2, HEART and other calculatorsREFRESH Note before signing :1}                              Medical Decision Making  ***  {Document critical care time when appropriate:1} {Document review of labs and clinical decision tools ie heart score, Chads2Vasc2 etc:1}  {Document your independent review of radiology images, and any outside records:1} {Document your discussion with family members, caretakers, and with consultants:1} {Document social determinants of health affecting pt's care:1} {Document your decision making why or why not admission, treatments were needed:1} Final Clinical Impression(s) / ED Diagnoses Final diagnoses:  None    Rx / DC Orders ED Discharge Orders     None

## 2022-12-29 NOTE — Plan of Care (Signed)

## 2022-12-29 NOTE — H&P (Signed)
TRH H&P   Patient Demographics:    [name], is a 72 y.o. male  MRN: [id number]   DOB - 1951/04/14  Admit Date - 12/29/2022  Outpatient Primary MD for the patient is Hal Hope Marcos Eke, MD  Referring MD/NP/PA: Dr Lockie Mola    Chief Complaint  Patient presents with   IV antibiotics      HPI:    [name]  is a 72 y.o. male, with medical history significant of history of TAVR, COPD, mild dementia, GERD, history of CVA, diabetes, hypertension, atrial fibrillation on anticoagulation.  Patient with recent hospitalization secondary secondary to Streptococcus TAVR endocarditis, for which she was treated for 2 weeks during hospital stay of 51 at the therapy gentamicin and Rocephin (he remained 2 weeks in the hospital due to high risk medication gentamicin), after he finished his gentamicin was discharged home with home health on ceftriaxone to finish total of 6 weeks treatment with stop date 01/21/2023 . -Health went to evaluate the patient, they can visit patient 2 days every week at home, but patient, and wife who is currently being treated with cancer, will be unable to administer the IV antibiotics safely at home, so patient was asked to come to ED by his home health for IV antibiotic administration, patient himself denies any complaints, no fever, no chills, no pain, no nausea or vomiting. -In emergency department his workup was significant for potassium 3.2, white blood cell count at 11.8, hemoglobin stable at 9.6, creatinine stable at 0.66, sodium at 134, triage hospitalist consulted to admit.   Review of systems:     A full 10 point Review of Systems was done, except as stated above, all other Review of Systems were negative.   With Past History of the following :    Past Medical History:  Diagnosis Date   Anemia    Asthma    Back pain, chronic, followed at pain clinic  11/25/2011   Carotid artery stenosis 09/06/2014   By Dopplers in September 2017: Right ICA 40-59%.  Less than 40% left ICA.   Colon polyps    COPD (chronic obstructive pulmonary disease) (HCC)    Dementia (HCC)    Mild   Depression    Dyslipidemia (high LDL; low HDL)    statin intolerant; on fibrate   Edema leg from Venous Stasis    Venous stasis :wears compression stockings; 08/2012 dopplers - no DVT or thrombophlebitis; mild R Popliteal V reflux - no VNUS ablation candidate   GERD (gastroesophageal reflux disease)    Hearing loss    History of CVA (cerebrovascular accident)    History of diabetes mellitus    History of DVT (deep vein thrombosis)    Hypertension    very labile   Nephrolithiasis    Osteoarthritis    Other idiopathic peripheral autonomic neuropathy    Agent orange   PAF (paroxysmal atrial fibrillation) (  HCC)    PONV (postoperative nausea and vomiting)    PTSD (post-traumatic stress disorder) 07/24/2015   per patient approach from foot of bed to awake; do not apply any contricting pressure, also avoid approaching from behind with any loud noises    S/P TAVR (transcatheter aortic valve replacement) 08/08/2019   s/p TAVR w/ a 29 mm Edwards Sapien 3 THV via the TF approach by Drs Excell Seltzer and Cornelius Moras   Seizure [facility])    Severe aortic stenosis    Skull fracture (HCC)    Small bowel obstruction (HCC) 1990s, 2001, 2015   s/p multiple bowel surgeries; from war wounds      Past Surgical History:  Procedure Laterality Date   APPENDECTOMY  1958   per patient   Carotid Dopplers  01/2016   Stable. RICA - slight progression from<40% to 40-59%.  Otherwise stable bilateral carotids and subclavian arteries. Stable LICA   COLONOSCOPY     DENTAL SURGERY  01/14/2017   5 extractions in preparation for LTKA on 01-25-17; also was started on amoxicillin x 7days; has since completed therapy    exploratory laparotomy with extensive lysis of adhesions  10/1999   JOINT REPLACEMENT     right  knee replaced 2x   LEFT HEART CATHETERIZATION WITH CORONARY ANGIOGRAM N/A 11/26/2011   WNL Runell Gess, MD   NM MYOVIEW LTD  08/2011   Negative lexiscan myoview: No ischemia or infarction   RIGHT/LEFT HEART CATH AND CORONARY ANGIOGRAPHY N/A 10/22/2016   Procedure: Right/Left Heart Cath and Coronary Angiography;  Surgeon: Marykay Lex, MD;  Location: [facility] INVASIVE CV LAB:: Angiographically normal/minimal CAD with tortuous coronary arteries.  Mild pulmonary pretension secondary to severely elevated LVEDP and systemic hypertension.  Mild-moderate aortic valve stenosis with mean gradient 21 mmHg.   RIGHT/LEFT HEART CATH AND CORONARY ANGIOGRAPHY N/A 07/13/2019   Procedure: RIGHT/LEFT HEART CATH AND CORONARY ANGIOGRAPHY;  Surgeon: Marykay Lex, MD;  Location: [facility] INVASIVE CV LAB;  Service: Cardiovascular;; : Angiographically normal coronary arteries.  Hemodynamic findings consistent with severe stenosis.  Normal RHC Pressures.   small bowel obstruction  1996, 1999, 2001, 2015   TEE WITHOUT CARDIOVERSION N/A 08/08/2019   Procedure: TRANSESOPHAGEAL ECHOCARDIOGRAM (TEE);  Surgeon: Tonny Bollman, MD;  Location: [facility] INVASIVE CV LAB;  Service: Open Heart Surgery;  Laterality: N/A;   TEE WITHOUT CARDIOVERSION N/A 12/14/2022   Procedure: TRANSESOPHAGEAL ECHOCARDIOGRAM;  Surgeon: Chrystie Nose, MD;  Location: [facility] INVASIVE CV LAB;  Service: Cardiovascular;  Laterality: N/A;   TONSILLECTOMY  1960   per patient   TOTAL KNEE ARTHROPLASTY     TOTAL KNEE ARTHROPLASTY Left 01/25/2017   Procedure: LEFT TOTAL KNEE ARTHROPLASTY;  Surgeon: Ollen Gross, MD;  Location: [facility];  Service: Orthopedics;  Laterality: Left;  Adductor Block   TRANSCATHETER AORTIC VALVE REPLACEMENT, TRANSFEMORAL N/A 08/08/2019   Procedure: TRANSCATHETER AORTIC VALVE REPLACEMENT, TRANSFEMORAL;  Surgeon: Tonny Bollman, MD;  Location: [facility] INVASIVE CV LAB;  Service: Open Heart Surgery;  Laterality: N/A;   TRANSTHORACIC ECHOCARDIOGRAM   06/07/2019    Severe calcific aortic stenosis-mean gradient up to 49 mmHg the peak of 79 mmHg.Mild-Mod AI.  Normal LV size and function.  EF 65 to 70%.  Mild concentric LVH with GR 1 DD.  Biatrial size-normal.  Moderate mitral valve calcification with mild MAC and mild MR -> compared to June 2020-increase in both gradients from mean 42 mmHg and peak 66 mmHg   TRANSTHORACIC ECHOCARDIOGRAM  07/2019   a) post TAVR echo 08/09/2019:POD #1 -  EF 60-65%, nl fxn TAVR with a mAVg of 14 mm Hg and no PVL.;;  09/07/2019:  o 1 month echo showed EF 65%, normally functioning TAVR with a mean gradient of 13 mm Hg and no PVL.   TRANSTHORACIC ECHOCARDIOGRAM  09/04/2020   1 year post TAVR:  EF 70 to 75%.  Hyperdynamic LV function.  GR 1 DD-mild LA dilation.[name]  No RWMA.  Normal RV function.  Unable to assess PAP-mildly elevated RAP.  TAVR valve in place.  Mean gradient 11 mmHg no PVL.  Mild ascending aortic dilation.  No change from previous echo   WRIST FRACTURE SURGERY        Social History:     Social History   Tobacco Use   Smoking status: Never   Smokeless tobacco: Former  Substance Use Topics   Alcohol use: Yes    Comment: occasional beer         Family History :     Family History  Problem Relation Age of Onset   Stroke Mother    Heart disease Mother    Heart attack Mother    Heart disease Father    Heart attack Father    Heart attack Sister    Heart attack Brother    Hypertension Brother    Heart attack Son    Stroke Son    Hypertension Brother    Sleep apnea Son    Non-Hodgkin's lymphoma Daughter     Home Medications:   Prior to Admission medications   Medication Sig Start Date End Date Taking? Authorizing Provider  albuterol (ACCUNEB) 0.63 MG/3ML nebulizer solution Take 1 ampule by nebulization every 6 (six) hours as needed for wheezing or shortness of breath.     [provider]  albuterol (VENTOLIN HFA) 108 (90 Base) MCG/ACT inhaler Inhale 1 puff into the lungs every 4  (four) hours as needed for wheezing or shortness of breath.    [provider]  ALPRAZolam Prudy Feeler) 0.5 MG tablet Take 0.5 mg by mouth 3 (three) times daily as needed for anxiety or sleep.    [provider]  apixaban (ELIQUIS) 5 MG TABS tablet Take 5 mg by mouth 2 (two) times daily.    [provider]  beclomethasone (BECONASE-AQ) 42 MCG/SPRAY nasal spray Place 1 spray into both nostrils daily.     [provider]  carvedilol (COREG) 12.5 MG tablet Take 1 tablet (12.5 mg total) by mouth 2 (two) times daily with a meal. 12/25/22   Burlon Centrella, Leana Roe, MD  cefTRIAXone (ROCEPHIN) IVPB Inject 2 g into the vein daily. Indication: Strep infantarius TAVR-associated endocarditis First Dose: Yes Last Day of Therapy: 01/21/2023 Labs - Once weekly:  CBC/D and BMP, Labs - Once weekly: ESR and CRP Method of administration: IV Push Method of administration may be changed at the discretion of home infusion pharmacist based upon assessment of the patient and/or caregiver's ability to self-administer the medication ordered. 12/25/22 01/25/23  Ira Busbin, Leana Roe, MD  Cholecalciferol (VITAMIN D-3) 125 MCG (5000 UT) TABS Take 5,000 Units by mouth daily.    [provider]  cyclobenzaprine (FLEXERIL) 5 MG tablet Take 5 mg by mouth 3 (three) times daily as needed. 11/12/22   [provider]  EPINEPHrine 0.3 mg/0.3 mL IJ SOAJ injection Inject 0.3 mg into the muscle once as needed for anaphylaxis. 09/09/21   [provider]  ferrous sulfate 325 (65 FE) MG tablet Take 325 mg by mouth daily. 12/07/22   [provider]  fluticasone-salmeterol (ADVAIR) 250-50 MCG/ACT AEPB Inhale 1 puff into the lungs in the morning and at bedtime. Rinse mouth well with water after each use    [provider]  losartan (COZAAR) 25 MG tablet Take 1 tablet (25 mg total) by mouth daily. 12/25/22   Mivaan Corbitt, Leana Roe, MD  Magnesium Hydroxide (MILK OF MAGNESIA PO) Take 1 Dose  by mouth as needed (constipation). 03/25/17   [provider]  morphine (MSIR) 15 MG tablet Take 15 mg by mouth every 4 (four) hours as needed for severe pain. 09/25/17   [provider]  Multiple Vitamin (MULTIVITAMIN WITH MINERALS) TABS tablet Take 1 tablet by mouth at bedtime.    [provider]  nitroGLYCERIN (NITROSTAT) 0.4 MG SL tablet Place 1 tablet (0.4 mg total) under the tongue every 5 (five) minutes as needed for chest pain. 08/07/19   Janetta Hora, PA-C  polyethylene glycol powder (GLYCOLAX/MIRALAX) 17 GM/SCOOP powder Take 1 Container by mouth 2 (two) times daily.    [provider]  promethazine (PHENERGAN) 25 MG tablet Take 25 mg by mouth 4 (four) times daily as needed for nausea or vomiting. 03/25/17   [provider]  rosuvastatin (CRESTOR) 40 MG tablet Take 40 mg by mouth daily.    [provider]  sertraline (ZOLOFT) 100 MG tablet Take 150 mg by mouth daily. 09/09/21   [provider]     Allergies:     Allergies  Allergen Reactions   Bee Venom Anaphylaxis   Bystolic [Nebivolol Hcl] Other (See Comments)    Nightmares, flashbacks (PTSD)   Codeine Shortness Of Breath and Rash   Doxycycline Shortness Of Breath and Rash   Iodinated Contrast Media Shortness Of Breath and Anaphylaxis    Lost consciousness/difficulty breathing Omni - Paque Contrast    Iohexol Hives, Shortness Of Breath and Rash    Neck and Torso erythemia IVP-DYE >> SHORTNESS OF BREATH    Levaquin [Levofloxacin] Hives and Shortness Of Breath   Oxycodone Hcl Shortness Of Breath, Swelling and Rash   Stadol [Butorphanol] Shortness Of Breath   Succinylcholine Chloride Shortness Of Breath, Nausea Only, Rash and Other (See Comments)    Difficulty breathing    Tamiflu [Oseltamivir Phosphate]     UNSPECIFIED REACTION    Atorvastatin     myalgias   Synvisc [Hylan G-F 20] Hives and Rash   Gemfibrozil     Other reaction(s): Serum cholesterol  raised   Latex Other (See Comments)    blister   Lisinopril     Other reaction(s): Cough   Omeprazole    Dilaudid [Hydromorphone Hcl] Nausea Only and Rash    Aggressive    Methocarbamol Nausea Only and Rash   Niacin Rash   Pentazocine Lactate Rash    rash     Physical Exam:   Vitals  Blood pressure (!) 178/86, pulse (!) 52, temperature 98 F (36.7 C), temperature source Oral, resp. rate 18, height 6\' 1"  (1.854 m), weight 81.2 kg, SpO2 100%.   1. General elderly male, laying in bed, no apparent distress  2. Normal affect and insight, Not Suicidal or Homicidal, Awake Alert, Oriented X 2 (he thinks it is August 2025)  3. No F.N deficits, ALL C.Nerves Intact, Strength 5/5 all 4 extremities, Sensation intact all 4 extremities, Plantars down going.  4. Ears and Eyes appear Normal, Conjunctivae clear, PERRLA. Moist Oral Mucosa.  5. Supple Neck, No JVD, No cervical lymphadenopathy appriciated, No Carotid Bruits.  6. Symmetrical  Chest wall movement, Good air movement bilaterally, CTAB.  7. RRR, No Gallops, Rubs or Murmurs, No Parasternal Heave.  8. Positive Bowel Sounds, Abdomen Soft, No tenderness, No organomegaly appriciated,No rebound -guarding or rigidity.  9.  No Cyanosis, Normal Skin Turgor, No Skin Rash or Bruise.  10. Good muscle tone,  joints appear normal , no effusions, Normal ROM.    Data Review:    CBC Recent Labs  Lab 12/24/22 0028 12/25/22 0359 12/29/22 1608  WBC 11.4* 10.4 11.8*  HGB 9.2* 8.9* 9.6*  HCT 29.4* 28.5* 31.4*  PLT 352 349 340  MCV 76.2* 76.8* 78.7*  MCH 23.8* 24.0* 24.1*  MCHC 31.3 31.2 30.6  RDW 16.3* 16.3* 17.0*  LYMPHSABS  --   --  2.7  MONOABS  --   --  0.8  EOSABS  --   --  0.2  BASOSABS  --   --  0.1   ------------------------------------------------------------------------------------------------------------------  Chemistries  Recent Labs  Lab 12/23/22 0756 12/24/22 0028 12/25/22 0359 12/29/22 1608  NA 130* 127* 129*  134*  K 4.0 4.1 4.2 3.2*  CL 99 94* 95* 106  CO2 25 24 25  19*  GLUCOSE 109* 102* 95 89  BUN 27* 33* 32* 14  CREATININE 0.81 1.11 1.05 0.66  CALCIUM 8.7* 8.6* 8.8* 7.7*   ------------------------------------------------------------------------------------------------------------------ estimated creatinine clearance is 94.3 mL/min (by C-G formula based on SCr of 0.66 mg/dL). ------------------------------------------------------------------------------------------------------------------ No results for input(s): "TSH", "T4TOTAL", "T3FREE", "THYROIDAB" in the last 72 hours.  Invalid input(s): "FREET3"  Coagulation profile No results for input(s): "INR", "PROTIME" in the last 168 hours. ------------------------------------------------------------------------------------------------------------------- No results for input(s): "DDIMER" in the last 72 hours. -------------------------------------------------------------------------------------------------------------------  Cardiac Enzymes No results for input(s): "CKMB", "TROPONINI", "MYOGLOBIN" in the last 168 hours.  Invalid input(s): "CK" ------------------------------------------------------------------------------------------------------------------    Component Value Date/Time   BNP 290.0 (H) 12/09/2022 2125     ---------------------------------------------------------------------------------------------------------------  Urinalysis    Component Value Date/Time   COLORURINE YELLOW 12/11/2022 1950   APPEARANCEUR CLEAR 12/11/2022 1950   LABSPEC 1.014 12/11/2022 1950   PHURINE 6.0 12/11/2022 1950   GLUCOSEU NEGATIVE 12/11/2022 1950   GLUCOSEU NEGATIVE 02/02/2007 0936   HGBUR NEGATIVE 12/11/2022 1950   BILIRUBINUR NEGATIVE 12/11/2022 1950   BILIRUBINUR negative 12/10/2015 1503   KETONESUR NEGATIVE 12/11/2022 1950   PROTEINUR NEGATIVE 12/11/2022 1950   UROBILINOGEN 2.0 12/10/2015 1503   UROBILINOGEN 1.0 01/16/2014 1146    NITRITE NEGATIVE 12/11/2022 1950   LEUKOCYTESUR NEGATIVE 12/11/2022 1950    ----------------------------------------------------------------------------------------------------------------   Imaging Results:    No results found.    Assessment & Plan:    Principal Problem:   Endocarditis Active Problems:   Diabetes mellitus type 2, controlled, with complications (HCC)   COPD mixed type (HCC)   Acquired hypothyroidism   Aortic valve endocarditis   Physical deconditioning     Streptococcus bacteremia, infective endocarditis of the TAVR valve -With recent hospitalization, with diagnosis of active endocarditis, with recommendation for IV antibiotics treatment on discharge, he finished 2 weeks of IV gentamicin during hospital stay, discharged on 12/25/2022 with recommendation to continue with IV Rocephin until 01/21/2023, but unfortunately patient/wife unable to take care of his needs and administration of IV antibiotics, so he will need placement at SNF facility for IV antibiotic administration. -His IV Rocephin has been ordered to continue during hospital stay.  Generalized weakness, deconditioning -Will consult PT/OT to keep following during hospital stay.  Hypertension -Patient with hypertension, blood pressure significantly elevated, but overall on the lower side upon recent  discharge, so I will continue his home dose Coreg 12.5 mg p.o. twice daily(was decreased from 25 mg p.o. twice daily given bradycardia and hypotension) -Continue with as needed hydralazine -Resume losartan at a higher dose 100 mg oral daily(losartan decreased upon recent discharge from 100 mg oral to 25 mg) 25 mg>> 100 mg )   History of TAVR  Hypokalemia -Repleted   COPD -Continue with home medications   Mixed hyperlipidemia - Continue Crestor   Persistent atrial fibrillation - Continue Coreg, Eliquis   Hypothyroidism - Continue Synthroid   DVT Prophylaxis on Eliquis  AM Labs Ordered, also  please review Full Orders  Family Communication: Admission, patients condition and plan of care including tests being ordered have been discussed with the patient (I was unable to reach wife by phone) who indicate understanding and agree with the plan and Code Status.  Code Status Full  Likely DC to  will need SNF  Condition GUARDED   Consults called: None  Admission status: observation    Time spent in minutes : 50 minutes   Huey Bienenstock M.D on 12/29/2022 at 5:18 PM   [facility] - Office  [phone]

## 2022-12-29 NOTE — ED Triage Notes (Signed)
Per [facility] pt coming from home- had new PICC line placed yesterday. States he is supposed to come here for more IV antibiotics for his heart.

## 2022-12-30 DIAGNOSIS — I10 Essential (primary) hypertension: Secondary | ICD-10-CM

## 2022-12-30 DIAGNOSIS — E876 Hypokalemia: Secondary | ICD-10-CM | POA: Insufficient documentation

## 2022-12-30 DIAGNOSIS — J449 Chronic obstructive pulmonary disease, unspecified: Secondary | ICD-10-CM | POA: Diagnosis not present

## 2022-12-30 DIAGNOSIS — I330 Acute and subacute infective endocarditis: Secondary | ICD-10-CM | POA: Diagnosis not present

## 2022-12-30 DIAGNOSIS — G894 Chronic pain syndrome: Secondary | ICD-10-CM | POA: Insufficient documentation

## 2022-12-30 DIAGNOSIS — R5381 Other malaise: Secondary | ICD-10-CM

## 2022-12-30 DIAGNOSIS — E039 Hypothyroidism, unspecified: Secondary | ICD-10-CM | POA: Insufficient documentation

## 2022-12-30 DIAGNOSIS — F0390 Unspecified dementia without behavioral disturbance: Secondary | ICD-10-CM | POA: Insufficient documentation

## 2022-12-30 DIAGNOSIS — E782 Mixed hyperlipidemia: Secondary | ICD-10-CM

## 2022-12-30 DIAGNOSIS — E118 Type 2 diabetes mellitus with unspecified complications: Secondary | ICD-10-CM

## 2022-12-30 MED ORDER — LEVOTHYROXINE SODIUM 25 MCG PO TABS
25.0000 ug | ORAL_TABLET | Freq: Every day | ORAL | Status: DC
  Administered 2022-12-31 – 2023-01-08 (×9): 25 ug via ORAL
  Filled 2022-12-30 (×9): qty 1

## 2022-12-30 NOTE — Assessment & Plan Note (Signed)
Blood pressure controlled - Continue carvedilol, losartan

## 2022-12-30 NOTE — Assessment & Plan Note (Signed)
Diet controlled in the setting of weight loss - Defer insulin

## 2022-12-30 NOTE — Assessment & Plan Note (Signed)
-   Continue home Coreg and Eliquis

## 2022-12-30 NOTE — Progress Notes (Addendum)
  Progress Note   Patient: [name] QQI:[id number] DOB: November 30, 1950 DOA: 12/29/2022     0 DOS: the patient was seen and examined on 12/30/2022 at 9:33 AM      Brief hospital course: [name] is a 72 y.o. M with hx mild dementia, lives at home, AF on Eliquis, hx CVA, HTN, DM, COPD, TAVR and recent TAVR associated aortic valve endocarditis who presented with falls.  Admitted 7/12 to 7/26 for a strep bovis TAVR endocarditis (Streptococcus infantarius) treated with 2 weeks gentamicin and transitioned to OP AT with Rocephin daily; patient worked with PT, and it was thought that he would be functional at home at the time of discharge.  However, since discharge, wife unable to provide antibiotics and patient too weak to transfer or walk safely and fell 5 times so he returned to the ER.     Assessment and Plan: * Endocarditis TAVR infective 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) - Continue Rocephin 2g once daily - EOT Aug 22 - Weekly CBC/D, CMP    Dementia without behavioral disturbance (HCC)    Chronic pain syndrome - Continue home morphine IR - Continue sertraline  Hypothyroidism, acquired - Resume levothyroxine  Hypokalemia Supplemented and resolved  Physical deconditioning - PT/OT/TOC  Essential hypertension Blood pressure controlled - Continue carvedilol, losartan  Hyponatremia Asymptomatic - Stop fluids  Persistent atrial fibrillation (HCC) - Continue home Coreg and Eliquis  COPD mixed type (HCC) No active flare.  FEV1 56%. - Continue ICS/LAMA  Mixed hyperlipidemia - Continue Crestor  Diabetes mellitus type 2, controlled, with complications (HCC) Diet controlled in the setting of weight loss - Defer insulin          Subjective: Patient has no complaints.  He would like to get out of bed.  He has no headache, chest pain, dyspnea, fever, confusion.  Nursing of no concerns.     Physical Exam: BP 133/79 (BP Location:  Left Arm)   Pulse (!) 52   Temp 97.8 F (36.6 C) (Oral)   Resp 16   Ht 6\' 1"  (1.854 m)   Wt 81.2 kg   SpO2 100%   BMI 23.62 kg/m   Thin frail elderly adult male, lying in bed, no acute distress RRR, soft systolic murmur, no peripheral edema Respiratory rate normal, lungs clear without rales or wheezes Abdomen soft without tenderness palpation Attention normal, seems slightly forgetful, face symmetric, speech fluent, generalized weakness in all 4 extremities    Data Reviewed: Basic metabolic panel notable for mild hyponatremia, resolved hypokalemia, stable creatinine CBC shows hemoglobin 10.9, otherwise unremarkable  Family Communication: None present   Disposition: Status is: Observation Was admitted recently for endocarditis.  He appeared safe for discharge at the time of discharge, but since going home, he failed home management, wife unable to provide antibiotics.  He will be admitted here for IV antibiotics while social work make arrangements for safe placement         Author: Alberteen Sam, MD 12/30/2022 3:17 PM  For on call review [email or web address].

## 2022-12-30 NOTE — Hospital Course (Signed)
[name] is a 72 y.o. M with hx mild dementia, lives at home, AF on Eliquis, hx CVA, HTN, DM, COPD, TAVR and recent TAVR associated aortic valve endocarditis who presented with falls.  Admitted 7/12 to 7/26 for a strep bovis TAVR endocarditis (Streptococcus infantarius) treated with 2 weeks gentamicin and transitioned to OP AT with Rocephin daily; patient worked with PT, and it was thought that he would be functional at home at the time of discharge, however, since discharge, wife unable to provide antibiotics and patient too weak to transfer or walk safely and fell 5 times so he returned to the ER.

## 2022-12-30 NOTE — Assessment & Plan Note (Addendum)
-   Continue home morphine IR - Continue sertraline

## 2022-12-30 NOTE — [place] FL2 (Signed)
[place] MEDICAID FL2 LEVEL OF CARE FORM     IDENTIFICATION  Patient Name: [name] Birthdate: Mar 09, 1951 Sex: male Admission Date (Current Location): 12/29/2022  [facility] and [place] Number:  Producer, television/film/video and Address:  [facility]. [facility], [address], [place], [place] [address]      Provider Number: [id number]  Attending Physician Name and Address:  Alberteen Sam, *  Relative Name and Phone Number:  [name], Husar (Spouse)  [phone]    Current Level of Care: Hospital Recommended Level of Care: Skilled Nursing Facility Prior Approval Number:    Date Approved/Denied: 12/29/22 PASRR Number: [id number]  Discharge Plan: SNF    Current Diagnoses: Patient Active Problem List   Diagnosis Date Noted   Hypokalemia 12/30/2022   Hypothyroidism, acquired 12/30/2022   Chronic pain syndrome 12/30/2022   Dementia without behavioral disturbance (HCC) 12/30/2022   Endocarditis 12/29/2022   Physical deconditioning 12/29/2022   Aortic valve endocarditis 12/11/2022   Fever of undetermined origin 12/10/2022   Fall at home, initial encounter 12/10/2022   Hyponatremia 12/10/2022   Elevated brain natriuretic peptide (BNP) level 12/10/2022   Microcytic anemia 12/10/2022   Essential hypertension 12/10/2022   SIRS (systemic inflammatory response syndrome) (HCC) 12/10/2022   Fever of unknown origin 12/10/2022   Senile purpura (HCC) 02/08/2022   S/P TAVR (transcatheter aortic valve replacement) 08/08/2019   Edema leg from Venous Stasis    Severe aortic stenosis    Chronic post-traumatic stress disorder (PTSD) 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 06/29/2019   Chronic sinusitis 11/02/2017   Persistent atrial fibrillation (HCC) 07/07/2017   Allergy to intravenous contrast 10/15/2016   COPD mixed type (HCC) 07/08/2016   Bilateral lower extremity edema 02/26/2013   Back pain, chronic, followed at pain clinic 11/25/2011   Incisional hernia 09/21/2011    Umbilical hernia 05/07/2011   Lung nodule 01/22/2009   Constipation 07/31/2008   Diabetes mellitus type 2, controlled, with complications (HCC) 01/16/2008   NEPHROLITHIASIS, HX OF 07/22/2007   Mixed hyperlipidemia 01/21/2007   Other idiopathic peripheral autonomic neuropathy (HCC) 01/21/2007   Hypertensive heart disease with chronic diastolic congestive heart failure (HCC) 01/21/2007   Allergic rhinitis 01/21/2007   GERD 01/21/2007   DIVERTICULOSIS, COLON 01/21/2007   History of stroke 01/21/2007   ESOPHAGEAL STRICTURE 02/02/2006    Orientation RESPIRATION BLADDER Height & Weight     Self, Time, Situation, Place  Normal Continent Weight: 179 lb (81.2 kg) Height:  6\' 1"  (185.4 cm)  BEHAVIORAL SYMPTOMS/MOOD NEUROLOGICAL BOWEL NUTRITION STATUS      Continent Diet (see discharge summary)  AMBULATORY STATUS COMMUNICATION OF NEEDS Skin   Limited Assist Verbally Skin abrasions, Other (Comment) (skin tear left upper arm. skin tear abdomen)                       Personal Care Assistance Level of Assistance  Bathing, Feeding, Dressing Bathing Assistance: Limited assistance Feeding assistance: Independent Dressing Assistance: Limited assistance Total Care Assistance: Limited assistance   Functional Limitations Info  Sight, Hearing, Speech Sight Info: Adequate Hearing Info: Impaired Speech Info: Adequate    SPECIAL CARE FACTORS FREQUENCY  PT (By licensed PT), OT (By licensed OT)     PT Frequency: 5x/week OT Frequency: 5x/week            Contractures Contractures Info: Not present    Additional Factors Info  Code Status, Allergies (cefTRIAXone (ROCEPHIN) 2 g in sodium chloride 0.9 % 100 mL IVPB Dose: 2 g Freq: Every  24 hours Route: IV) Code Status Info: FULL Allergies Info: Bee Venom  Bystolic (Nebivolol Hcl)  Codeine  Doxycycline  Iodinated Contrast Media  Iohexol  Levaquin (Levofloxacin)  Oxycodone Hcl  Stadol (Butorphanol)  Succinylcholine Chloride  Tamiflu  (Oseltamivir Phosphate)  Atorvastatin  Synvisc (Hylan G-f 20)  Gemfibrozil  Latex  Lisinopril  Omeprazole  Dilaudid (Hydromorphone Hcl)  Methocarbamol  Niacin  Pentazocine Lactate           Current Medications (12/30/2022):  This is the current hospital active medication list Current Facility-Administered Medications  Medication Dose Route Frequency Provider Last Rate Last Admin   acetaminophen (TYLENOL) tablet 650 mg  650 mg Oral Q6H PRN Elgergawy, Leana Roe, MD   650 mg at 12/29/22 2132   Or   acetaminophen (TYLENOL) suppository 650 mg  650 mg Rectal Q6H PRN Elgergawy, Leana Roe, MD       albuterol (PROVENTIL) (2.5 MG/3ML) 0.083% nebulizer solution 3 mL  3 mL Nebulization Q6H PRN Elgergawy, Leana Roe, MD       ALPRAZolam Prudy Feeler) tablet 0.5 mg  0.5 mg Oral TID PRN Elgergawy, Leana Roe, MD       apixaban (ELIQUIS) tablet 5 mg  5 mg Oral BID Elgergawy, Leana Roe, MD   5 mg at 12/30/22 2045   carvedilol (COREG) tablet 12.5 mg  12.5 mg Oral BID WC Elgergawy, Leana Roe, MD   12.5 mg at 12/30/22 1733   cefTRIAXone (ROCEPHIN) 2 g in sodium chloride 0.9 % 100 mL IVPB  2 g Intravenous Q24H Elgergawy, Leana Roe, MD 200 mL/hr at 12/30/22 1736 2 g at 12/30/22 1736   cholecalciferol (VITAMIN D3) 25 MCG (1000 UNIT) tablet 5,000 Units  5,000 Units Oral Daily Elgergawy, Leana Roe, MD   5,000 Units at 12/30/22 1005   feeding supplement (ENSURE ENLIVE / ENSURE PLUS) liquid 237 mL  237 mL Oral BID BM Elgergawy, Leana Roe, MD   237 mL at 12/30/22 1739   [START ON 12/31/2022] levothyroxine (SYNTHROID) tablet 25 mcg  25 mcg Oral Q0600 Danford, Earl Lites, MD       lidocaine (LIDODERM) 5 % 1 patch  1 patch Transdermal Q24H Crosley, Debby, MD   1 patch at 12/30/22 2047   losartan (COZAAR) tablet 100 mg  100 mg Oral Daily Elgergawy, Leana Roe, MD   100 mg at 12/30/22 1004   mometasone-formoterol (DULERA) 200-5 MCG/ACT inhaler 2 puff  2 puff Inhalation BID Elgergawy, Leana Roe, MD   2 puff at 12/30/22 2010   morphine (MSIR)  tablet 15 mg  15 mg Oral Q4H PRN Elgergawy, Leana Roe, MD   15 mg at 12/30/22 2045   multivitamin with minerals tablet 1 tablet  1 tablet Oral QHS Elgergawy, Leana Roe, MD   1 tablet at 12/30/22 2045   nitroGLYCERIN (NITROSTAT) SL tablet 0.4 mg  0.4 mg Sublingual Q5 min PRN Elgergawy, Leana Roe, MD       ondansetron (ZOFRAN) tablet 4 mg  4 mg Oral Q6H PRN Elgergawy, Leana Roe, MD       Or   ondansetron (ZOFRAN) injection 4 mg  4 mg Intravenous Q6H PRN Elgergawy, Leana Roe, MD       rosuvastatin (CRESTOR) tablet 40 mg  40 mg Oral Daily Elgergawy, Leana Roe, MD   40 mg at 12/30/22 1006   sertraline (ZOLOFT) tablet 150 mg  150 mg Oral Daily Elgergawy, Leana Roe, MD   150 mg at 12/30/22 1004     Discharge Medications: Please see  discharge summary for a list of discharge medications.  Relevant Imaging Results:  Relevant Lab Results:   Additional Information SSN: [id number].  Petrice Beedy A [place], LCSWA

## 2022-12-30 NOTE — Evaluation (Addendum)
Occupational Therapy Evaluation Patient Details Name: [name] MRN: [id number] DOB: 11-28-1950 Today's Date: 12/30/2022   History of Present Illness Pt is a 72 y.o. M who presents 12/29/2022 with inability to take care of themselves at home. Pt just discharged with a PICC line for IV antibiotics for endocarditis. Significant PMH: COPD, mild dementia, CVA, idiopathic peripheral autonomic neuropathy ([place]), PAF, PTSD, TAVR, seizure.   Clinical Impression   [name] was evaluated s/p the above admission list. He is generally mod I at baseline, he lives with his wife who is unable to physically assist him and he admits to several falls. Upon evaluation the pt was limited by generalized weakness, unsteady gait and poor activity tolerance. Overall he needed min G for transfers and short mobility with RW. Due to the deficits listed below the pt also needs up to mod A for LB ADLs and up to set up for UB ADLs in sitting. Pt will benefit from continued acute OT services and skilled inpatient follow up therapy, <3 hours/day.   Pt also reporting he had a fall last night in the bathroom with staff present, and RN and NT assisted him up. Current shift made aware.      Recommendations for follow up therapy are one component of a multi-disciplinary discharge planning process, led by the attending physician.  Recommendations may be updated based on patient status, additional functional criteria and insurance authorization.   Assistance Recommended at Discharge Frequent or constant Supervision/Assistance  Patient can return home with the following A little help with walking and/or transfers;A lot of help with bathing/dressing/bathroom;Assistance with cooking/housework;Assist for transportation;Help with stairs or ramp for entrance    Functional Status Assessment  Patient has had a recent decline in their functional status and demonstrates the ability to make significant improvements in function in a  reasonable and predictable amount of time.  Equipment Recommendations  None recommended by OT       Precautions / Restrictions Precautions Precautions: Fall Precaution Comments: falls have all been backwards; sometimes dizzy, sometimes just off-balance Restrictions Weight Bearing Restrictions: No      Mobility Bed Mobility Overal bed mobility: Needs Assistance Bed Mobility: Supine to Sit     Supine to sit: Min assist          Transfers Overall transfer level: Needs assistance Equipment used: Rolling walker (2 wheels) Transfers: Sit to/from Stand Sit to Stand: Min guard           General transfer comment: Pt declined ambulating more than 7ft due to report of recent fall with staff      Balance Overall balance assessment: Needs assistance Sitting-balance support: Feet supported, No upper extremity supported Sitting balance-Leahy Scale: Good     Standing balance support: Bilateral upper extremity supported, Reliant on assistive device for balance Standing balance-Leahy Scale: Poor Standing balance comment: benefits from BUE support                           ADL either performed or assessed with clinical judgement   ADL Overall ADL's : Needs assistance/impaired Eating/Feeding: Independent   Grooming: Set up;Sitting   Upper Body Bathing: Set up;Sitting   Lower Body Bathing: Minimal assistance;Sit to/from stand   Upper Body Dressing : Set up;Sitting   Lower Body Dressing: Moderate assistance;Sit to/from stand   Toilet Transfer: Min guard;Ambulation;Rolling walker (2 wheels) Toilet Transfer Details (indicate cue type and reason): short distance only as pt reported he fell in  the bathroom with staff last night and is now hesitant to move Toileting- Clothing Manipulation and Hygiene: Min guard;Sitting/lateral lean       Functional mobility during ADLs: Min guard;Rolling walker (2 wheels) General ADL Comments: slow guarded movements, pt reporst  being very sore from his fall     Vision   Vision Assessment?: No apparent visual deficits     Perception Perception Perception Tested?: No   Praxis Praxis Praxis tested?: Not tested    Pertinent Vitals/Pain Pain Assessment Pain Assessment: Faces Faces Pain Scale: Hurts even more Pain Location: back Pain Descriptors / Indicators: Discomfort, Sore Pain Intervention(s): Limited activity within patient's tolerance, Monitored during session     Hand Dominance Right   Extremity/Trunk Assessment Upper Extremity Assessment Upper Extremity Assessment: Generalized weakness RUE Deficits / Details: Grossly 3+/5 LUE Deficits / Details: Grossly 4/5   Lower Extremity Assessment Lower Extremity Assessment: Defer to PT evaluation   Cervical / Trunk Assessment Cervical / Trunk Assessment: Kyphotic   Communication Communication Communication: No difficulties   Cognition Arousal/Alertness: Awake/alert Behavior During Therapy: WFL for tasks assessed/performed Overall Cognitive Status: History of cognitive impairments - at baseline                                 General Comments: Hx mild dementia - overall cog was [facility] for simple tasks.     General Comments  VSS on RA, multiple bruises and skin tears. Pt reports that he had a recent fall with staff and ended up on the bathroom floor. Nsg made aware.    Exercises     Shoulder Instructions      Home Living Family/patient expects to be discharged to:: Private residence Living Arrangements: Spouse/significant other Available Help at Discharge: Family Type of Home: House Home Access: Ramped entrance     Home Layout: One level     Bathroom Shower/Tub: Chief Strategy Officer: Handicapped height     Home Equipment: Agricultural consultant (2 wheels);Rollator (4 wheels)   Additional Comments: wife unable to physically assist      Prior Functioning/Environment Prior Level of Function : Independent/Modified  Independent             Mobility Comments: ambulates with RW and has frequent falls ADLs Comments: mod I, frequent falls        OT Problem List: Decreased strength;Decreased range of motion;Decreased activity tolerance;Impaired balance (sitting and/or standing);Decreased safety awareness;Decreased knowledge of precautions;Decreased knowledge of use of DME or AE      OT Treatment/Interventions: Self-care/ADL training;Therapeutic exercise;DME and/or AE instruction;Therapeutic activities;Patient/family education;Balance training    OT Goals(Current goals can be found in the care plan section) Acute Rehab OT Goals Patient Stated Goal: to stop falling OT Goal Formulation: With patient Time For Goal Achievement: 01/13/23 Potential to Achieve Goals: Good ADL Goals Pt Will Perform Grooming: with supervision;standing Pt Will Perform Lower Body Dressing: with supervision;sit to/from stand Pt Will Transfer to Toilet: with supervision;ambulating Additional ADL Goal #1: pt will indep recall at least 3 fall prevention strategies to apply at discharge Additional ADL Goal #2: Pt will navigate hospital environment without safety concerns in preparation for safe discharge  OT Frequency: Min 2X/week    Co-evaluation              AM-PAC OT "6 Clicks" Daily Activity     Outcome Measure Help from another person eating meals?: None Help from another person taking care  of personal grooming?: A Little Help from another person toileting, which includes using toliet, bedpan, or urinal?: A Little Help from another person bathing (including washing, rinsing, drying)?: A Little Help from another person to put on and taking off regular upper body clothing?: A Little Help from another person to put on and taking off regular lower body clothing?: A Lot 6 Click Score: 18   End of Session Equipment Utilized During Treatment: Gait belt Nurse Communication: Mobility status  Activity Tolerance: Patient  tolerated treatment well Patient left: in chair;with call bell/phone within reach  OT Visit Diagnosis: Unsteadiness on feet (R26.81);Other abnormalities of gait and mobility (R26.89);Repeated falls (R29.6);Muscle weakness (generalized) (M62.81);History of falling (Z91.81);Pain                Time: 9528-4132 OT Time Calculation (min): 19 min Charges:  OT General Charges $OT Visit: 1 Visit OT Evaluation $OT Eval Moderate Complexity: 1 Mod  Derenda Mis, OTR/L Acute Rehabilitation Services Office [phone] Secure Chat Communication Preferred   Donia Pounds 12/30/2022, 10:49 AM

## 2022-12-30 NOTE — Assessment & Plan Note (Signed)
-   Supplemented and resolved 

## 2022-12-30 NOTE — Assessment & Plan Note (Signed)
No active flare.  FEV1 56%. - Continue ICS/LAMA

## 2022-12-30 NOTE — Plan of Care (Signed)

## 2022-12-30 NOTE — Assessment & Plan Note (Signed)
-   Resume levothyroxine 

## 2022-12-30 NOTE — Assessment & Plan Note (Signed)
Continue Crestor 

## 2022-12-30 NOTE — Assessment & Plan Note (Signed)
Asymptomatic - Stop fluids

## 2022-12-30 NOTE — Assessment & Plan Note (Signed)
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

## 2022-12-30 NOTE — TOC Progression Note (Signed)
Transition of Care [facility]) - Progression Note    Patient Details  Name: [name] MRN: [id number] Date of Birth: [birth year]  Transition of Care [facility]) CM/SW Contact  Jendayi Berling A [place], [place] Phone Number: 12/30/2022, 11:02 AM  Clinical Narrative:     CSW faxed referral to [facility], [phone] for [facility] to [facility] skilled nursing and short term rehab.   [facility] with [facility].   TOC will continue to follow.   Expected Discharge Plan: Skilled Nursing Facility Barriers to Discharge: Continued Medical Work up  Expected Discharge Plan and Services       Living arrangements for the past 2 months: Single Family Home                             HH Agency: [facility]         Social Determinants of Health (SDOH) Interventions SDOH Screenings   Food Insecurity: No Food Insecurity (12/29/2022)  Housing: Low Risk  (12/29/2022)  Transportation Needs: No Transportation Needs (12/29/2022)  Utilities: Not At Risk (12/29/2022)  Tobacco Use: Medium Risk (12/29/2022)    Readmission Risk Interventions     No data to display

## 2022-12-30 NOTE — Assessment & Plan Note (Signed)
-   PT/OT/TOC

## 2022-12-30 NOTE — TOC Progression Note (Signed)
Transition of Care [facility]) - Progression Note    Patient Details  Name: [name] MRN: [id number] Date of Birth: 1950/09/17  Transition of Care [facility]) CM/SW Contact  Ciro Tashiro A [place], [place] Phone Number: 12/30/2022, 3:38 PM  Clinical Narrative:     CSW met with pt and contacted pt's wife at bedside and spoke with both of them regarding [facility].   They said it was ok for CSW to also send referral to facilities that could take pt's [employer].   CSW will fax pt out to facilities and present bed offers when available and get update from [place] regarding pt's approval for rehab.   TOC will continue to follow.   Expected Discharge Plan: Skilled Nursing Facility Barriers to Discharge: Continued Medical Work up  Expected Discharge Plan and Services       Living arrangements for the past 2 months: Single Family Home                             HH Agency: [facility] Care         Social Determinants of Health (SDOH) Interventions SDOH Screenings   Food Insecurity: No Food Insecurity (12/29/2022)  Housing: Low Risk  (12/29/2022)  Transportation Needs: No Transportation Needs (12/29/2022)  Utilities: Not At Risk (12/29/2022)  Tobacco Use: Medium Risk (12/29/2022)    Readmission Risk Interventions     No data to display

## 2022-12-30 NOTE — Assessment & Plan Note (Signed)
Continue levothyroxine 

## 2022-12-31 DIAGNOSIS — I330 Acute and subacute infective endocarditis: Secondary | ICD-10-CM | POA: Diagnosis present

## 2022-12-31 DIAGNOSIS — I6523 Occlusion and stenosis of bilateral carotid arteries: Secondary | ICD-10-CM | POA: Diagnosis present

## 2022-12-31 DIAGNOSIS — E871 Hypo-osmolality and hyponatremia: Secondary | ICD-10-CM | POA: Diagnosis present

## 2022-12-31 DIAGNOSIS — I4819 Other persistent atrial fibrillation: Secondary | ICD-10-CM | POA: Diagnosis present

## 2022-12-31 DIAGNOSIS — Y712 Prosthetic and other implants, materials and accessory cardiovascular devices associated with adverse incidents: Secondary | ICD-10-CM | POA: Diagnosis present

## 2022-12-31 DIAGNOSIS — Z885 Allergy status to narcotic agent status: Secondary | ICD-10-CM | POA: Diagnosis not present

## 2022-12-31 DIAGNOSIS — F03A0 Unspecified dementia, mild, without behavioral disturbance, psychotic disturbance, mood disturbance, and anxiety: Secondary | ICD-10-CM | POA: Diagnosis present

## 2022-12-31 DIAGNOSIS — Z87891 Personal history of nicotine dependence: Secondary | ICD-10-CM | POA: Diagnosis not present

## 2022-12-31 DIAGNOSIS — H9190 Unspecified hearing loss, unspecified ear: Secondary | ICD-10-CM | POA: Diagnosis present

## 2022-12-31 DIAGNOSIS — F32A Depression, unspecified: Secondary | ICD-10-CM | POA: Diagnosis present

## 2022-12-31 DIAGNOSIS — F4310 Post-traumatic stress disorder, unspecified: Secondary | ICD-10-CM | POA: Diagnosis present

## 2022-12-31 DIAGNOSIS — R7881 Bacteremia: Secondary | ICD-10-CM | POA: Diagnosis present

## 2022-12-31 DIAGNOSIS — N179 Acute kidney failure, unspecified: Secondary | ICD-10-CM | POA: Diagnosis not present

## 2022-12-31 DIAGNOSIS — I10 Essential (primary) hypertension: Secondary | ICD-10-CM | POA: Diagnosis present

## 2022-12-31 DIAGNOSIS — E118 Type 2 diabetes mellitus with unspecified complications: Secondary | ICD-10-CM | POA: Diagnosis present

## 2022-12-31 DIAGNOSIS — T826XXA Infection and inflammatory reaction due to cardiac valve prosthesis, initial encounter: Secondary | ICD-10-CM | POA: Diagnosis present

## 2022-12-31 DIAGNOSIS — E876 Hypokalemia: Secondary | ICD-10-CM | POA: Diagnosis present

## 2022-12-31 DIAGNOSIS — B954 Other streptococcus as the cause of diseases classified elsewhere: Secondary | ICD-10-CM | POA: Diagnosis present

## 2022-12-31 DIAGNOSIS — E039 Hypothyroidism, unspecified: Secondary | ICD-10-CM | POA: Diagnosis present

## 2022-12-31 DIAGNOSIS — D649 Anemia, unspecified: Secondary | ICD-10-CM | POA: Diagnosis present

## 2022-12-31 DIAGNOSIS — E782 Mixed hyperlipidemia: Secondary | ICD-10-CM | POA: Diagnosis present

## 2022-12-31 DIAGNOSIS — E869 Volume depletion, unspecified: Secondary | ICD-10-CM | POA: Diagnosis present

## 2022-12-31 DIAGNOSIS — J4489 Other specified chronic obstructive pulmonary disease: Secondary | ICD-10-CM | POA: Diagnosis present

## 2022-12-31 DIAGNOSIS — Z95828 Presence of other vascular implants and grafts: Secondary | ICD-10-CM | POA: Diagnosis not present

## 2022-12-31 DIAGNOSIS — G894 Chronic pain syndrome: Secondary | ICD-10-CM | POA: Diagnosis present

## 2022-12-31 LAB — TSH: TSH: 3.081 u[IU]/mL (ref 0.350–4.500)

## 2022-12-31 MED ORDER — CHLORHEXIDINE GLUCONATE CLOTH 2 % EX PADS
6.0000 | MEDICATED_PAD | Freq: Every day | CUTANEOUS | Status: DC
  Administered 2022-12-31 – 2023-01-08 (×9): 6 via TOPICAL

## 2022-12-31 MED ORDER — SODIUM CHLORIDE 0.9 % IV BOLUS
1000.0000 mL | Freq: Once | INTRAVENOUS | Status: AC
  Administered 2022-12-31: 1000 mL via INTRAVENOUS

## 2022-12-31 NOTE — TOC Progression Note (Signed)
Transition of Care [facility]) - Progression Note    Patient Details  Name: [name] MRN: [id number] Date of Birth: 1950/12/06  [facility]) CM/SW Contact  Janae Bridgeman, RN Phone Number: 12/31/2022, 12:38 PM  Clinical Narrative:    The patient's wife called and states that she is unable to provide care for the patient in the home until he is able to mobilize more independently.  She also had multiple questions regarding his medical condition that she would like to discuss with the physician.  I sent a message to the attending physician to have him follow up with the patient's wife by phone.  I spoke with Kiva [place], MSW and asked that she call the wife back regarding SNF placement and rehabilitation needs.  TOC Team will continue to follow the patient for TOC needs.   Expected Discharge Plan: Skilled Nursing Facility Barriers to Discharge: Continued Medical Work up  Expected Discharge Plan and Services       Living arrangements for the past 2 months: Single Family Home                             HH Agency: [facility] Care         Social Determinants of Health (SDOH) Interventions SDOH Screenings   Food Insecurity: No Food Insecurity (12/29/2022)  Housing: Low Risk  (12/29/2022)  Transportation Needs: No Transportation Needs (12/29/2022)  Utilities: Not At Risk (12/29/2022)  Tobacco Use: Medium Risk (12/29/2022)    Readmission Risk Interventions     No data to display

## 2022-12-31 NOTE — Plan of Care (Signed)
Olena Heckle LPN

## 2022-12-31 NOTE — Progress Notes (Signed)
  Progress Note   Patient: [name] [id number]:[id number] DOB: [birth year] DOA: 12/29/2022     0 DOS: the patient was seen and examined on 12/31/2022 at 9:33 AM      Brief hospital course: Patient is a 72 year old Caucasian male with past medical history significant for atrial fibrillation on Eliquis, history of CVA, hypertension, diabetes mellitus, COPD, mild dementia, TAVR and recent TAVR associated aortic valve endocarditis.  Patient was hospitalized from 7/12 to 12/25/2022 for a strep bovis TAVR endocarditis (Streptococcus infantarius) and was treated with 2 weeks gentamicin and transitioned to OPAT with Rocephin daily.  On prior admission, patient worked with PT, and it was thought that he would be functional at home at the time of discharge.  However, since discharge, wife unable to provide antibiotics and patient was too weak to transfer or walk safely and fell 5 times so he returned to the ER.   Patient was admitted with recurrent falls.  Patient will need to be placed.  12/31/2022: Patient seen.  No new complaints.  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   Chronic pain syndrome - Continue home morphine IR - Continue sertraline -Patient is also on Xanax.  Minimize mind altering medications.  Hypothyroidism, acquired - Resume levothyroxine  Hypokalemia Supplemented and resolved 12/31/2022: Potassium is 4.6 today.  Physical deconditioning - PT/OT/TOC  Essential hypertension Blood pressure controlled - Continue carvedilol, losartan  Hyponatremia Asymptomatic - Stop fluids 12/31/2022: Hyponatremia is chronic.  Repeat urinalysis, urine sodium, serum osmolality, urine osmolality.  Suspect SIADH.  Persistent atrial fibrillation (HCC) - Continue home Coreg and Eliquis  COPD mixed type  (HCC) No active flare.  FEV1 56%. - Continue ICS/LAMA  Mixed hyperlipidemia - Continue Crestor  Diabetes mellitus type 2, controlled, with complications (HCC) Diet controlled in the setting of weight loss - Defer insulin    Subjective: Patient has no complaints. No fever or chills No chest pain No shortness of breath  Physical Exam: BP (!) 109/53 (BP Location: Left Arm)   Pulse (!) 51   Temp 97.9 F (36.6 C) (Oral)   Resp 16   Ht 6\' 1"  (1.854 m)   Wt 81.2 kg   SpO2 100%   BMI 23.62 kg/m   General condition: Patient is pale.  Not in any distress.  Patient is awake and alert. HEENT: Mild pallor.  No jaundice. Neck: Supple. Lungs: Clear to auscultation. CVS: S1-S2. Abdomen: Obese, soft and nontender. Neuro: Awake and alert. Extremities no leg edema.      Data Reviewed: Basic metabolic panel notable for hyponatremia   Family Communication: None present   Disposition: Status is: Inpatient. Was admitted recently for endocarditis.  He appeared safe for discharge at the time of discharge, but since going home, he failed home management, wife unable to provide antibiotics.  He will be admitted here for IV antibiotics while social work make arrangements for safe placement  Time spent: 55 minutes.   Author: Barnetta Chapel, MD 12/31/2022 6:32 PM  For on call review [email or web address].

## 2022-12-31 NOTE — Plan of Care (Signed)

## 2022-12-31 NOTE — Progress Notes (Signed)
Physical Therapy Treatment Patient Details Name: [name] MRN: [id number] DOB: 11/29/1950 Today's Date: 12/31/2022   History of Present Illness Pt is a 72 y.o. M who presents 12/29/2022 with inability to take care of themselves at home. Pt just discharged with a PICC line for IV antibiotics for endocarditis. Significant PMH: COPD, mild dementia, CVA, idiopathic peripheral autonomic neuropathy ([place]), PAF, PTSD, TAVR, seizure.    PT Comments  Pt greeted resting in bed and agreeable to session with steady progress towards acute goals. Pt requiring grossly min A fading to min guard for all OOB mobility with pt demonstrating posture bias initially on standing with ability to correct with cues and min A.  Pt needing cues during short gait bout for RW management and posture with pt demonstrating bil low foot clearance and lifting RW each step. Pt continues to remain at high risk for continued falls and current plan remains appropriate to address deficits and maximize functional independence and decrease caregiver burden. Pt continues to benefit from skilled PT services to progress toward functional mobility goals.    Pt also reporting he had a fall last night in the bathroom with staff present and "took the nurse down with him". Current shift made aware.    If plan is discharge home, recommend the following: A little help with walking and/or transfers;A little help with bathing/dressing/bathroom;Assistance with cooking/housework;Assist for transportation;Help with stairs or ramp for entrance   Can travel by private vehicle     Yes  Equipment Recommendations  None recommended by PT    Recommendations for Other Services       Precautions / Restrictions Precautions Precautions: Fall Precaution Comments: falls have all been backwards; sometimes dizzy, sometimes just off-balance Restrictions Weight Bearing Restrictions: No     Mobility  Bed Mobility Overal bed mobility: Needs  Assistance Bed Mobility: Supine to Sit     Supine to sit: Min assist     General bed mobility comments: min A to elevate trunk    Transfers Overall transfer level: Needs assistance Equipment used: Rolling walker (2 wheels) Transfers: Sit to/from Stand Sit to Stand: Min guard, Min assist           General transfer comment: min A to correct posteior bias, down to min guard with practice, able to complete x5 at end of session from chair height    Ambulation/Gait Ambulation/Gait assistance: Min guard Gait Distance (Feet): 18 Feet Assistive device: Rolling walker (2 wheels) Gait Pattern/deviations: Step-through pattern, Trunk flexed Gait velocity: slow pace     General Gait Details: Min guard for safety, with distance limited to pt stated fatigue   Stairs             Wheelchair Mobility     Tilt Bed    Modified Rankin (Stroke Patients Only)       Balance Overall balance assessment: Needs assistance Sitting-balance support: Feet supported, No upper extremity supported Sitting balance-Leahy Scale: Good     Standing balance support: Bilateral upper extremity supported, Reliant on assistive device for balance Standing balance-Leahy Scale: Poor Standing balance comment: benefits from BUE support                            Cognition Arousal/Alertness: Awake/alert Behavior During Therapy: WFL for tasks assessed/performed Overall Cognitive Status: History of cognitive impairments - at baseline  General Comments: Hx mild dementia - overall cog was [facility] for simple tasks.        Exercises Other Exercises Other Exercises: serial sit<>stand x5    General Comments General comments (skin integrity, edema, etc.): VSS on RA, multiple bruises and skin tears. Pt reports that he had a recent fall with staff and ended up on the bathroom floor. Nsg made aware.      Pertinent Vitals/Pain Pain Assessment Pain  Assessment: No/denies pain Pain Intervention(s): Monitored during session    Home Living                          Prior Function            PT Goals (current goals can now be found in the care plan section) Acute Rehab PT Goals Patient Stated Goal: agreeable to rehab PT Goal Formulation: With patient/family Time For Goal Achievement: 01/12/23 Progress towards PT goals: Progressing toward goals    Frequency    Min 1X/week      PT Plan Current plan remains appropriate    Co-evaluation              AM-PAC PT "6 Clicks" Mobility   Outcome Measure  Help needed turning from your back to your side while in a flat bed without using bedrails?: A Little Help needed moving from lying on your back to sitting on the side of a flat bed without using bedrails?: A Lot Help needed moving to and from a bed to a chair (including a wheelchair)?: A Little Help needed standing up from a chair using your arms (e.g., wheelchair or bedside chair)?: A Little Help needed to walk in hospital room?: A Little Help needed climbing 3-5 steps with a railing? : Total 6 Click Score: 15    End of Session Equipment Utilized During Treatment: Gait belt Activity Tolerance: Patient tolerated treatment well Patient left: with call bell/phone within reach;in chair;with chair alarm set Nurse Communication: Mobility status;Other (comment) (vestibulopathy) PT Visit Diagnosis: Repeated falls (R29.6);Other abnormalities of gait and mobility (R26.89);History of falling (Z91.81);Unsteadiness on feet (R26.81)     Time: 1610-9604 PT Time Calculation (min) (ACUTE ONLY): 25 min  Charges:    $Gait Training: 8-22 mins $Therapeutic Activity: 8-22 mins PT General Charges $$ ACUTE PT VISIT: 1 Visit                     Cilicia Borden R. PTA Acute Rehabilitation Services Office: [phone]   Catalina Antigua 12/31/2022, 12:05 PM

## 2022-12-31 NOTE — TOC Progression Note (Signed)
Transition of Care [facility]) - Progression Note    Patient Details  Name: [name] MRN: [id number] Date of Birth: September 05, 1950  Transition of [facility]) CM/SW Contact  Luci Bellucci A [place], [place] Phone Number: 12/31/2022, 4:19 PM  Clinical Narrative:     CSW contacted pt's wife [name] with bed offers. CSW provided contact information for [facility] for wife to reach out to facility.    Expected Discharge Plan: Skilled Nursing Facility Barriers to Discharge: Continued Medical Work up  Expected Discharge Plan and Services       Living arrangements for the past 2 months: Single Family Home                             HH Agency: [facility]         Social Determinants of Health (SDOH) Interventions SDOH Screenings   Food Insecurity: No Food Insecurity (12/29/2022)  Housing: Low Risk  (12/29/2022)  Transportation Needs: No Transportation Needs (12/29/2022)  Utilities: Not At Risk (12/29/2022)  Tobacco Use: Medium Risk (12/29/2022)    Readmission Risk Interventions     No data to display

## 2022-12-31 NOTE — TOC Progression Note (Signed)
Transition of Care [facility]) - Progression Note    Patient Details  Name: [name] MRN: [id number] Date of Birth: 06/18/1950  Transition of Care [facility]) CM/SW Contact  Dashawn Bartnick A [place], [place] Phone Number: 12/31/2022, 4:28 PM  Clinical Narrative:     CSW contacted Shcaleah, Phone: [phone] ext [phone] at [facility] regarding referral for rehab. She stated that their printer was down and it was taking a long time to get referrals.  She said that they have received pt's referral and that the [facility] team would reach out to CSW tomorrow with information for rehab placements.   CSW updated pt's wife, [name] with information about [facility] and informed her that CSW will reach out with update of SNF places contracted with the [place] once that is received from the [place].   Expected Discharge Plan: Skilled Nursing Facility Barriers to Discharge: Continued Medical Work up  Expected Discharge Plan and Services       Living arrangements for the past 2 months: Single Family Home                             HH Agency: [facility]         Social Determinants of Health (SDOH) Interventions SDOH Screenings   Food Insecurity: No Food Insecurity (12/29/2022)  Housing: Low Risk  (12/29/2022)  Transportation Needs: No Transportation Needs (12/29/2022)  Utilities: Not At Risk (12/29/2022)  Tobacco Use: Medium Risk (12/29/2022)    Readmission Risk Interventions     No data to display

## 2023-01-01 DIAGNOSIS — I330 Acute and subacute infective endocarditis: Secondary | ICD-10-CM | POA: Diagnosis not present

## 2023-01-01 LAB — URINALYSIS, ROUTINE W REFLEX MICROSCOPIC
Bilirubin Urine: NEGATIVE
Glucose, UA: NEGATIVE mg/dL
Hgb urine dipstick: NEGATIVE
Ketones, ur: NEGATIVE mg/dL
Leukocytes,Ua: NEGATIVE
Nitrite: NEGATIVE
Protein, ur: NEGATIVE mg/dL
Specific Gravity, Urine: 1.014 (ref 1.005–1.030)
pH: 5 (ref 5.0–8.0)

## 2023-01-01 LAB — SODIUM, URINE, RANDOM: Sodium, Ur: 75 mmol/L

## 2023-01-01 MED ORDER — DIPHENHYDRAMINE HCL 25 MG PO CAPS
25.0000 mg | ORAL_CAPSULE | Freq: Three times a day (TID) | ORAL | Status: AC | PRN
  Administered 2023-01-01 – 2023-01-04 (×4): 25 mg via ORAL
  Filled 2023-01-01 (×4): qty 1

## 2023-01-01 MED ORDER — SODIUM CHLORIDE 0.9 % IV SOLN: INTRAVENOUS | Status: DC

## 2023-01-01 MED ORDER — DIPHENHYDRAMINE HCL 25 MG PO CAPS
25.0000 mg | ORAL_CAPSULE | Freq: Once | ORAL | Status: AC | PRN
  Administered 2023-01-01: 25 mg via ORAL
  Filled 2023-01-01: qty 1

## 2023-01-01 NOTE — Discharge Instructions (Signed)

## 2023-01-01 NOTE — Progress Notes (Signed)
Progress Note   Patient: [name] XBJ:[id number] DOB: 19-Jul-1950 DOA: 12/29/2022     1 DOS: the patient was seen and examined on 01/01/2023 at 9:33 AM      Brief hospital course: Patient is a 72 year old Caucasian male with past medical history significant for atrial fibrillation on Eliquis, history of CVA, hypertension, diabetes mellitus, COPD, mild dementia, TAVR and recent TAVR associated aortic valve endocarditis.  Patient was hospitalized from 7/12 to 12/25/2022 for a strep bovis TAVR endocarditis (Streptococcus infantarius) and was treated with 2 weeks gentamicin and transitioned to OPAT with Rocephin daily.  On prior admission, patient worked with PT, and it was thought that he would be functional at home at the time of discharge.  However, since discharge, wife unable to provide antibiotics and patient was too weak to transfer or walk safely and fell 5 times so he returned to the ER.   Patient was admitted with recurrent falls.  Patient will need to be placed.  12/31/2022: Patient seen.  No new complaints. 01/01/2023: Serum creatinine has risen to 1.34.  Suspect patient is volume depleted.  Will check urinalysis and urine sodium.  Will start patient on normal saline 50 cc/h.  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   Chronic pain syndrome - Continue home morphine IR - Continue sertraline -Patient is also on Xanax.  Minimize mind altering medications.  Hypothyroidism, acquired - Resume levothyroxine  Hypokalemia Supplemented and resolved 12/31/2022: Potassium is 4.6 today.  Physical deconditioning - PT/OT/TOC  Essential hypertension Blood pressure controlled - Continue carvedilol, losartan  Hyponatremia Asymptomatic Urinalysis revealed specific gravity of 1.014. Urine  sodium is not reliable.  Ranges from 11-75. Urine and serum osmolality are both 292. May need to repeat above studies at some point.  Persistent atrial fibrillation (HCC) - Continue home Coreg and Eliquis  COPD mixed type (HCC) No active flare.  FEV1 56%. - Continue ICS/LAMA  Mixed hyperlipidemia - Continue Crestor  Diabetes mellitus type 2, controlled, with complications (HCC) Diet controlled in the setting of weight loss - Defer insulin  Worsening serum creatinine/AKI, mild: -Suspect secondary to volume depletion -Check urinalysis -Check urine sodium -Normal saline 50 cc/h  Subjective: Patient has no complaints. No fever or chills No chest pain No shortness of breath  Physical Exam: BP (!) 108/56 (BP Location: Left Arm)   Pulse (!) 53   Temp 97.9 F (36.6 C) (Oral)   Resp 16   Ht 6\' 1"  (1.854 m)   Wt 78.6 kg   SpO2 100%   BMI 22.86 kg/m   General condition: Patient is pale.  Not in any distress.  Patient is awake and alert. HEENT: Mild pallor.  No jaundice. Neck: Supple. Lungs: Clear to auscultation. CVS: S1-S2. Abdomen: Obese, soft and nontender. Neuro: Awake and alert. Extremities no leg edema.      Data Reviewed: Basic metabolic panel notable for hyponatremia   Family Communication: None present   Disposition: Status is: Inpatient. Was admitted recently for endocarditis.  He appeared safe for discharge at the time of discharge, but since going home, he failed home management, wife unable to provide antibiotics.  He will be admitted here for IV antibiotics while social work make arrangements for safe placement  Time spent: 35 minutes.   Author: Barnetta Chapel, MD 01/01/2023 2:15 PM  For on call review [email or web address].

## 2023-01-01 NOTE — Plan of Care (Signed)
William Heckle, LPN

## 2023-01-02 DIAGNOSIS — I330 Acute and subacute infective endocarditis: Secondary | ICD-10-CM | POA: Diagnosis not present

## 2023-01-02 NOTE — Plan of Care (Signed)
Olena Heckle, LPN

## 2023-01-02 NOTE — Progress Notes (Signed)
Progress Note   Patient: [name]:[id number] DOB: Sep 18, 1950 DOA: 12/29/2022     2 DOS: the patient was seen and examined on 01/02/2023 at 9:33 AM      Brief hospital course: Patient is a 72 year old Caucasian male with past medical history significant for atrial fibrillation on Eliquis, history of CVA, hypertension, diabetes mellitus, COPD, mild dementia, TAVR and recent TAVR associated aortic valve endocarditis.  Patient was hospitalized from 7/12 to 12/25/2022 for a strep bovis TAVR endocarditis (Streptococcus infantarius) and was treated with 2 weeks gentamicin and transitioned to OPAT with Rocephin daily.  On prior admission, patient worked with PT, and it was thought that he would be functional at home at the time of discharge.  However, since discharge, wife unable to provide antibiotics and patient was too weak to transfer or walk safely and fell 5 times so he returned to the ER.   Patient was admitted with recurrent falls.  Patient will need to be placed.  01/01/2023: Serum creatinine has risen to 1.34.  Suspect patient is volume depleted.  Will check urinalysis and urine sodium.  Will start patient on normal saline 50 cc/h. 01/02/2023: Patient seen.  No new complaints.  Serum creatinine is down to 1.  Continue gentle hydration.  Pursue disposition.  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   Chronic pain syndrome - Continue home morphine IR - Continue sertraline -Patient is also on Xanax.  Minimize mind altering medications.  Hypothyroidism, acquired - Resume levothyroxine  Hypokalemia Supplemented and resolved 12/31/2022: Potassium is 4.6 today.  Physical deconditioning - PT/OT/TOC  Essential hypertension Blood pressure controlled - Continue carvedilol,  losartan  Hyponatremia Asymptomatic Urinalysis revealed specific gravity of 1.014. Urine sodium is not reliable.  Ranges from 11-75. Urine and serum osmolality are both 292. May need to repeat above studies at some point.  Persistent atrial fibrillation (HCC) - Continue home Coreg and Eliquis  COPD mixed type (HCC) No active flare.  FEV1 56%. - Continue ICS/LAMA  Mixed hyperlipidemia - Continue Crestor  Diabetes mellitus type 2, controlled, with complications (HCC) Diet controlled in the setting of weight loss - Defer insulin  Worsening serum creatinine/AKI, mild: -Suspect secondary to volume depletion -Check urinalysis -Check urine sodium -Normal saline 50 cc/h 01/02/2023: Resolved.  Serum creatinine is down to 1.  Subjective: Patient has no complaints. No fever or chills No chest pain No shortness of breath  Physical Exam: BP 131/64 (BP Location: Left Arm)   Pulse (!) 52   Temp 97.7 F (36.5 C) (Oral)   Resp 18   Ht 6\' 1"  (1.854 m)   Wt 78.6 kg   SpO2 100%   BMI 22.86 kg/m   General condition: Patient is pale.  Not in any distress.  Patient is awake and alert. HEENT: Mild pallor.  No jaundice. Neck: Supple. Lungs: Clear to auscultation. CVS: S1-S2. Abdomen: Obese, soft and nontender. Neuro: Awake and alert. Extremities no leg edema.      Data Reviewed: Basic metabolic panel notable for hyponatremia   Family Communication: None present   Disposition: Status is: Inpatient. Was admitted recently for endocarditis.  He appeared safe for discharge at the time of discharge, but since going home, he failed home management, wife unable to provide antibiotics.  He will be admitted here for IV antibiotics while social  work make arrangements for safe placement  Time spent: 35 minutes.   Author: Barnetta Chapel, MD 01/02/2023 4:10 PM  For on call review [email or web address].

## 2023-01-02 NOTE — Plan of Care (Signed)

## 2023-01-03 DIAGNOSIS — I330 Acute and subacute infective endocarditis: Secondary | ICD-10-CM | POA: Diagnosis not present

## 2023-01-03 NOTE — Progress Notes (Signed)
Progress Note   Patient: [name]:[id number] DOB: 1951/02/10 DOA: 12/29/2022     3 DOS: the patient was seen and examined on 01/03/2023 at 9:33 AM      Brief hospital course: Patient is a 72 year old Caucasian male with past medical history significant for atrial fibrillation on Eliquis, history of CVA, hypertension, diabetes mellitus, COPD, mild dementia, TAVR and recent TAVR associated aortic valve endocarditis.  Patient was hospitalized from 7/12 to 12/25/2022 for a strep bovis TAVR endocarditis (Streptococcus infantarius) and was treated with 2 weeks gentamicin and transitioned to OPAT with Rocephin daily.  On prior admission, patient worked with PT, and it was thought that he would be functional at home at the time of discharge.  However, since discharge, wife unable to provide antibiotics and patient was too weak to transfer or walk safely and fell 5 times so he returned to the ER.   Patient was admitted with recurrent falls.  Patient will need to be placed.  01/01/2023: Serum creatinine has risen to 1.34.  Suspect patient is volume depleted.  Will check urinalysis and urine sodium.  Will start patient on normal saline 50 cc/h. 01/03/2023: Patient seen.  No new complaints.  Pursue disposition.  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   Chronic pain syndrome - Continue home morphine IR - Continue sertraline -Patient is also on Xanax.  Minimize mind altering medications.  Hypothyroidism, acquired - Resume levothyroxine  Hypokalemia Supplemented and resolved 12/31/2022: Potassium is 4.6 today.  Physical deconditioning - PT/OT/TOC  Essential hypertension Blood pressure controlled - Continue carvedilol, losartan  Hyponatremia Asymptomatic Urinalysis revealed specific gravity of  1.014. Urine sodium is not reliable.  Ranges from 11-75. Urine and serum osmolality are both 292. May need to repeat above studies at some point.  Persistent atrial fibrillation (HCC) - Continue home Coreg and Eliquis  COPD mixed type (HCC) No active flare.  FEV1 56%. - Continue ICS/LAMA  Mixed hyperlipidemia - Continue Crestor  Diabetes mellitus type 2, controlled, with complications (HCC) Diet controlled in the setting of weight loss - Defer insulin  Worsening serum creatinine/AKI, mild: -Suspect secondary to volume depletion -Check urinalysis -Check urine sodium -Normal saline 50 cc/h 01/02/2023: Resolved.  Serum creatinine is down to 1.  Subjective: Patient has no complaints. No fever or chills No chest pain No shortness of breath  Physical Exam: BP (!) 97/52 (BP Location: Left Arm)   Pulse (!) 54   Temp 98.1 F (36.7 C)   Resp 18   Ht 6\' 1"  (1.854 m)   Wt 78.6 kg   SpO2 99%   BMI 22.86 kg/m   General condition: Patient is pale.  Not in any distress.  Patient is awake and alert. HEENT: Mild pallor.  No jaundice. Neck: Supple. Lungs: Clear to auscultation. CVS: S1-S2. Abdomen: Obese, soft and nontender. Neuro: Awake and alert. Extremities no leg edema.      Data Reviewed: Basic metabolic panel notable for hyponatremia   Family Communication: None present   Disposition: Status is: Inpatient. Was admitted recently for endocarditis.  He appeared safe for discharge at the time of discharge, but since going home, he failed home management, wife unable to provide antibiotics.  He will be admitted here for IV antibiotics while social work make arrangements for safe placement  Time spent: 35 minutes.  Author: Barnetta Chapel, MD 01/03/2023 2:37 PM  For on call review [email or web address].

## 2023-01-03 NOTE — Progress Notes (Signed)
Occupational Therapy Treatment Patient Details Name: [name] MRN: [id number] DOB: 09-Mar-1951 Today's Date: 01/03/2023   History of present illness Pt is a 72 y.o. M who presents 12/29/2022 with inability to take care of themselves at home. Pt just discharged with a PICC line for IV antibiotics for endocarditis. Significant PMH: COPD, mild dementia, CVA, idiopathic peripheral autonomic neuropathy (Agent Orange), PAF, PTSD, TAVR, seizure.   OT comments  Patient received in supine and agreeable to OT session. Patient with complaints of back pain and min assist to get to EOB and transfer to recliner with RW. Patient declined standing at sink for grooming due to pain and performed seated in recliner and UB ADLs. Patient performed standing tasks from recliner to RW with min assist to stand and min guard for balance. Patient will benefit from continued inpatient follow up therapy, <3 hours/day to further address bathing, dressing, and functional transfer. Acute OT to continue to follow.    Recommendations for follow up therapy are one component of a multi-disciplinary discharge planning process, led by the attending physician.  Recommendations may be updated based on patient status, additional functional criteria and insurance authorization.    Assistance Recommended at Discharge Frequent or constant Supervision/Assistance  Patient can return home with the following  A little help with walking and/or transfers;A lot of help with bathing/dressing/bathroom;Assistance with cooking/housework;Assist for transportation;Help with stairs or ramp for entrance   Equipment Recommendations  None recommended by OT    Recommendations for Other Services      Precautions / Restrictions Precautions Precautions: Fall Precaution Comments: falls have all been backwards; sometimes dizzy, sometimes just off-balance Restrictions Weight Bearing Restrictions: No       Mobility Bed Mobility Overal bed mobility:  Needs Assistance Bed Mobility: Supine to Sit     Supine to sit: Min assist     General bed mobility comments: increased time and assistanc with trunk    Transfers Overall transfer level: Needs assistance Equipment used: Rolling walker (2 wheels) Transfers: Sit to/from Stand, Bed to chair/wheelchair/BSC Sit to Stand: Min assist     Step pivot transfers: Min assist     General transfer comment: min assist to power up and cues for hand placement     Balance Overall balance assessment: Needs assistance Sitting-balance support: Feet supported, No upper extremity supported Sitting balance-Leahy Scale: Good Sitting balance - Comments: seated on EOB                                   ADL either performed or assessed with clinical judgement   ADL Overall ADL's : Needs assistance/impaired     Grooming: Set up;Sitting   Upper Body Bathing: Set up;Sitting   Lower Body Bathing: Minimal assistance;Sit to/from stand Lower Body Bathing Details (indicate cue type and reason): due to lines         Toilet Transfer: Minimal assistance;Rolling walker (2 wheels) Toilet Transfer Details (indicate cue type and reason): simulated to recliner                Extremity/Trunk Assessment              Vision       Perception     Praxis      Cognition Arousal/Alertness: Awake/alert Behavior During Therapy: WFL for tasks assessed/performed Overall Cognitive Status: History of cognitive impairments - at baseline  General Comments: Hx mild dementia - overall cog was [facility] for simple tasks.        Exercises Exercises: Other exercises Other Exercises Other Exercises: reaching tasks while standing    Shoulder Instructions       General Comments      Pertinent Vitals/ Pain       Pain Assessment Pain Assessment: 0-10 Pain Score: 4  Pain Location: back Pain Descriptors / Indicators: Discomfort, Sore Pain  Intervention(s): Limited activity within patient's tolerance, Monitored during session, Repositioned  Home Living                                          Prior Functioning/Environment              Frequency  Min 2X/week        Progress Toward Goals  OT Goals(current goals can now be found in the care plan section)  Progress towards OT goals: Progressing toward goals  Acute Rehab OT Goals Patient Stated Goal: get better OT Goal Formulation: With patient Time For Goal Achievement: 01/13/23 Potential to Achieve Goals: Good ADL Goals Pt Will Perform Grooming: with supervision;standing Pt Will Perform Lower Body Dressing: with supervision;sit to/from stand Pt Will Transfer to Toilet: with supervision;ambulating Additional ADL Goal #1: pt will indep recall at least 3 fall prevention strategies to apply at discharge Additional ADL Goal #2: Pt will navigate hospital environment without safety concerns in preparation for safe discharge  Plan Discharge plan remains appropriate    Co-evaluation                 AM-PAC OT "6 Clicks" Daily Activity     Outcome Measure   Help from another person eating meals?: None Help from another person taking care of personal grooming?: A Little Help from another person toileting, which includes using toliet, bedpan, or urinal?: A Little Help from another person bathing (including washing, rinsing, drying)?: A Little Help from another person to put on and taking off regular upper body clothing?: A Little Help from another person to put on and taking off regular lower body clothing?: A Lot 6 Click Score: 18    End of Session Equipment Utilized During Treatment: Gait belt;Rolling walker (2 wheels)  OT Visit Diagnosis: Unsteadiness on feet (R26.81);Other abnormalities of gait and mobility (R26.89);Repeated falls (R29.6);Muscle weakness (generalized) (M62.81);History of falling (Z91.81);Pain Pain - part of body:   (back)   Activity Tolerance Patient tolerated treatment well   Patient Left in chair;with call bell/phone within reach   Nurse Communication Mobility status        Time: 1478-2956 OT Time Calculation (min): 26 min  Charges: OT General Charges $OT Visit: 1 Visit OT Treatments $Self Care/Home Management : 8-22 mins $Therapeutic Activity: 8-22 mins  Alfonse Flavors, OTA Acute Rehabilitation Services  Office [phone]   Dewain Penning 01/03/2023, 11:58 AM

## 2023-01-03 NOTE — Plan of Care (Signed)

## 2023-01-04 DIAGNOSIS — I330 Acute and subacute infective endocarditis: Secondary | ICD-10-CM | POA: Diagnosis not present

## 2023-01-04 MED ORDER — CARVEDILOL 6.25 MG PO TABS
6.2500 mg | ORAL_TABLET | Freq: Two times a day (BID) | ORAL | Status: DC
  Administered 2023-01-04 – 2023-01-08 (×4): 6.25 mg via ORAL
  Filled 2023-01-04 (×6): qty 1

## 2023-01-04 MED ORDER — AMLODIPINE BESYLATE 5 MG PO TABS
5.0000 mg | ORAL_TABLET | Freq: Every day | ORAL | Status: DC
  Administered 2023-01-04 – 2023-01-08 (×5): 5 mg via ORAL
  Filled 2023-01-04 (×5): qty 1

## 2023-01-04 MED ORDER — DIPHENHYDRAMINE HCL 25 MG PO CAPS
25.0000 mg | ORAL_CAPSULE | Freq: Once | ORAL | Status: AC | PRN
  Administered 2023-01-04: 25 mg via ORAL
  Filled 2023-01-04: qty 1

## 2023-01-04 MED ORDER — SODIUM CHLORIDE 0.9% FLUSH: 10.0000 mL | INTRAVENOUS | Status: DC | PRN

## 2023-01-04 MED ORDER — SODIUM CHLORIDE 0.9% FLUSH
10.0000 mL | Freq: Two times a day (BID) | INTRAVENOUS | Status: DC
  Administered 2023-01-04 – 2023-01-05 (×3): 10 mL
  Administered 2023-01-06: 40 mL
  Administered 2023-01-06: 10 mL
  Administered 2023-01-07: 40 mL
  Administered 2023-01-07 – 2023-01-08 (×2): 10 mL

## 2023-01-04 NOTE — Progress Notes (Signed)
Progress Note   Patient: [name] ZOX:[id number] DOB: [birth year] DOA: 12/29/2022     4 DOS: the patient was seen and examined on 01/04/2023 at 9:33 AM      Brief hospital course: Patient is a 72 year old Caucasian male with past medical history significant for atrial fibrillation on Eliquis, history of CVA, hypertension, diabetes mellitus, COPD, mild dementia, TAVR and recent TAVR associated aortic valve endocarditis.  Patient was hospitalized from 7/12 to 12/25/2022 for a strep bovis TAVR endocarditis (Streptococcus infantarius) and was treated with 2 weeks gentamicin and transitioned to OPAT with Rocephin daily.  On prior admission, patient worked with PT, and it was thought that he would be functional at home at the time of discharge.  However, since discharge, wife unable to provide antibiotics and patient was too weak to transfer or walk safely and fell 5 times so he returned to the ER.   Patient was admitted with recurrent falls.  Patient will need to be placed.  01/01/2023: Serum creatinine has risen to 1.34.  Suspect patient is volume depleted.  Will check urinalysis and urine sodium.  Will start patient on normal saline 50 cc/h. 8/5//24: Patient seen.  No new complaints.  Pursue disposition.  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   Chronic pain syndrome - Continue home morphine IR - Continue sertraline -Patient is also on Xanax.  Minimize mind altering medications.  Hypothyroidism, acquired - Resume levothyroxine  Hypokalemia Supplemented and resolved 12/31/2022: Potassium is 4.6 today.  Physical deconditioning - PT/OT/TOC  Essential hypertension Blood pressure controlled - Continue carvedilol, losartan  Hyponatremia Asymptomatic Urinalysis revealed specific gravity of  1.014. Urine sodium is not reliable.  Ranges from 11-75. Urine and serum osmolality are both 292. May need to repeat above studies at some point.  Persistent atrial fibrillation (HCC) - Continue home Coreg and Eliquis  COPD mixed type (HCC) No active flare.  FEV1 56%. - Continue ICS/LAMA  Mixed hyperlipidemia - Continue Crestor  Diabetes mellitus type 2, controlled, with complications (HCC) Diet controlled in the setting of weight loss - Defer insulin  Worsening serum creatinine/AKI, mild: -Suspect secondary to volume depletion -Check urinalysis -Check urine sodium -Normal saline 50 cc/h 01/02/2023: Resolved.  Serum creatinine is down to 1.  Subjective: Patient has no complaints. No fever or chills No chest pain No shortness of breath  Physical Exam: BP (!) 153/73 (BP Location: Left Arm)   Pulse (!) 48   Temp 98 F (36.7 C) (Oral)   Resp 16   Ht 6\' 1"  (1.854 m)   Wt 78.6 kg   SpO2 100%   BMI 22.86 kg/m   General condition: Patient is pale.  Not in any distress.  Patient is awake and alert. HEENT: Mild pallor.  No jaundice. Neck: Supple. Lungs: Clear to auscultation. CVS: S1-S2. Abdomen: Obese, soft and nontender. Neuro: Awake and alert. Extremities no leg edema.      Data Reviewed: Basic metabolic panel notable for hyponatremia   Family Communication: None present   Disposition: Status is: Inpatient. Was admitted recently for endocarditis.  He appeared safe for discharge at the time of discharge, but since going home, he failed home management, wife unable to provide antibiotics.  He will be admitted here for IV antibiotics while social work make arrangements for safe placement  Time spent: 35 minutes.  Author: Barnetta Chapel, MD 01/04/2023 4:37 PM  For on call review [email or web address].

## 2023-01-04 NOTE — Plan of Care (Signed)
William Heckle, LPN

## 2023-01-04 NOTE — Progress Notes (Signed)
Physical Therapy Treatment Patient Details Name: [name] MRN: [id number] DOB: 11-01-1950 Today's Date: 01/04/2023   History of Present Illness Pt is a 72 y.o. M who presents 12/29/2022 with inability to take care of themselves at home. Pt just discharged with a PICC line for IV antibiotics for endocarditis. Significant PMH: COPD, mild dementia, CVA, idiopathic peripheral autonomic neuropathy (Agent Orange), PAF, PTSD, TAVR, seizure.    PT Comments  Patient agreeable on third arrival to participate in therapy having been medicated and completed lunch. Pt required min guard for bed mobility and min assist for sit<>stand from EOB, bed height elevated slightly to improve ability to power up. Pt able to initiate stepping with greater difficulty noted advancing Rt Le compared to Lt. Pt reliant on Ue's to control lowering to sit in lower recliner. Pt amb 1x~12' and reports limited by bil LE pain related to chronic neuropathy and requesting to sit. Attempted seated LE exercises for marching/LAQ however pt limited by pain. Encouraged time OOB and pt agreeable. Will continue to progress as able.    If plan is discharge home, recommend the following: A little help with walking and/or transfers;A little help with bathing/dressing/bathroom;Assistance with cooking/housework;Assist for transportation;Help with stairs or ramp for entrance   Can travel by private vehicle     Yes  Equipment Recommendations  None recommended by PT    Recommendations for Other Services       Precautions / Restrictions Precautions Precautions: Fall Precaution Comments: falls have all been backwards; sometimes dizzy, sometimes just off-balance Restrictions Weight Bearing Restrictions: No     Mobility  Bed Mobility Overal bed mobility: Needs Assistance Bed Mobility: Supine to Sit     Supine to sit: Min guard, HOB elevated     General bed mobility comments: pt asking for assitance at start however cues for  sequencing use of bed rail, min guard with no assist needed.    Transfers Overall transfer level: Needs assistance Equipment used: Rolling walker (2 wheels) Transfers: Sit to/from Stand, Bed to chair/wheelchair/BSC Sit to Stand: Min guard, Min assist   Step pivot transfers: Min assist       General transfer comment: EOB elevated slightly and cues for hand placement provided. guard for power up from elevated surface. min assist to guide RW for turn bed>chair and for sit<>stand from lower recliner surface.    Ambulation/Gait Ambulation/Gait assistance: Min guard Gait Distance (Feet): 12 Feet Assistive device: Rolling walker (2 wheels) Gait Pattern/deviations: Step-through pattern, Decreased step length - right, Decreased stride length, Decreased dorsiflexion - right, Shuffle Gait velocity: decr     General Gait Details: min guard with intermittent light assist to facilitate anterior lean and cues to weight shift into great toes. pt with difficulty advancing Rt LE and impaired step placement with c/o neuropathic pain being more limiting today.   Stairs             Wheelchair Mobility     Tilt Bed    Modified Rankin (Stroke Patients Only)       Balance Overall balance assessment: Needs assistance Sitting-balance support: Feet supported, No upper extremity supported Sitting balance-Leahy Scale: Good Sitting balance - Comments: seated on EOB Postural control: Posterior lean Standing balance support: Reliant on assistive device for balance, Bilateral upper extremity supported Standing balance-Leahy Scale: Poor Standing balance comment: posterrio lean in standing. cues for ant weight shift into big toes.  Cognition Arousal/Alertness: Awake/alert Behavior During Therapy: WFL for tasks assessed/performed Overall Cognitive Status: History of cognitive impairments - at baseline                                 General  Comments: Hx mild dementia - overall cog was [facility] for simple tasks.        Exercises General Exercises - Lower Extremity Long Arc Quad: AROM, Both, Seated, 5 reps, Limitations    General Comments        Pertinent Vitals/Pain Pain Assessment Pain Assessment: Faces Faces Pain Scale: Hurts even more Pain Location: bil LE's Pain Descriptors / Indicators: Stabbing, Numbness Pain Intervention(s): Limited activity within patient's tolerance, Monitored during session, Repositioned, Premedicated before session    Home Living                          Prior Function            PT Goals (current goals can now be found in the care plan section) Acute Rehab PT Goals Patient Stated Goal: agreeable to rehab PT Goal Formulation: With patient/family Time For Goal Achievement: 01/12/23 Potential to Achieve Goals: Good Progress towards PT goals: Progressing toward goals    Frequency    Min 1X/week      PT Plan Current plan remains appropriate    Co-evaluation              AM-PAC PT "6 Clicks" Mobility   Outcome Measure  Help needed turning from your back to your side while in a flat bed without using bedrails?: A Little Help needed moving from lying on your back to sitting on the side of a flat bed without using bedrails?: A Little Help needed moving to and from a bed to a chair (including a wheelchair)?: A Little Help needed standing up from a chair using your arms (e.g., wheelchair or bedside chair)?: A Little Help needed to walk in hospital room?: A Little Help needed climbing 3-5 steps with a railing? : Total 6 Click Score: 16    End of Session Equipment Utilized During Treatment: Gait belt Activity Tolerance: Patient tolerated treatment well Patient left: in chair;with call bell/phone within reach Nurse Communication: Mobility status PT Visit Diagnosis: Repeated falls (R29.6);Other abnormalities of gait and mobility (R26.89);History of falling  (Z91.81);Unsteadiness on feet (R26.81)     Time: 1610-9604 PT Time Calculation (min) (ACUTE ONLY): 20 min  Charges:    $Therapeutic Activity: 8-22 mins PT General Charges $$ ACUTE PT VISIT: 1 Visit                     Wynn Maudlin, DPT Acute Rehabilitation Services Office [phone]  01/04/23 1:15 PM

## 2023-01-04 NOTE — Plan of Care (Signed)
  Problem: Activity: Goal: Risk for activity intolerance will decrease Outcome: Progressing   Problem: Nutrition: Goal: Adequate nutrition will be maintained Outcome: Progressing   Problem: Pain Managment: Goal: General experience of comfort will improve Outcome: Progressing   Problem: Safety: Goal: Ability to remain free from injury will improve Outcome: Progressing   

## 2023-01-04 NOTE — TOC Progression Note (Addendum)
Transition of Care [facility]) - Progression Note    Patient Details  Name: [name] MRN: [id number] Date of Birth: 1951/02/04  Transition of [facility]) CM/SW Contact  Tyshawna Alarid A [place], [place] Phone Number: 01/04/2023, 2:31 PM  Clinical Narrative:      Update 1453  CSW left voicemail with pt's wife [name] with disposition update. There was no answer, CSW left VM. CSW will follow up at another time.     CSW contacted Lowry Ram, MSW, LCSW, PACT Social Worker  Phone: [phone] ext [phone]  Email: [email or web address].   CSW left voicemail regarding update on [place] approval/denial for short term rehab.   TOC will continue to follow.   Expected Discharge Plan: Skilled Nursing Facility Barriers to Discharge: Continued Medical Work up  Expected Discharge Plan and Services       Living arrangements for the past 2 months: Single Family Home                             HH Agency: [facility]         Social Determinants of Health (SDOH) Interventions SDOH Screenings   Food Insecurity: No Food Insecurity (12/29/2022)  Housing: Low Risk  (12/29/2022)  Transportation Needs: No Transportation Needs (12/29/2022)  Utilities: Not At Risk (12/29/2022)  Tobacco Use: Medium Risk (12/29/2022)    Readmission Risk Interventions     No data to display

## 2023-01-04 NOTE — Care Management Important Message (Signed)
Important Message  Patient Details  Name: [name] MRN: [id number] Date of Birth: 12-Feb-1951   Medicare Important Message Given:  Yes     Jaymen Fetch 01/04/2023, 3:14 PM

## 2023-01-05 DIAGNOSIS — I330 Acute and subacute infective endocarditis: Secondary | ICD-10-CM | POA: Diagnosis not present

## 2023-01-05 MED ORDER — DIPHENHYDRAMINE HCL 25 MG PO CAPS
25.0000 mg | ORAL_CAPSULE | Freq: Four times a day (QID) | ORAL | Status: DC | PRN
  Administered 2023-01-05 – 2023-01-07 (×6): 25 mg via ORAL
  Filled 2023-01-05 (×6): qty 1

## 2023-01-05 NOTE — Plan of Care (Signed)

## 2023-01-05 NOTE — Progress Notes (Signed)
PROGRESS NOTE    [name]  EXB:[id number] DOB: [birth year] DOA: 12/29/2022 PCP: [name], MD   Brief Narrative:    Patient is a 72 year old Caucasian male with past medical history significant for atrial fibrillation on Eliquis, history of CVA, hypertension, diabetes mellitus, COPD, mild dementia, TAVR and recent TAVR associated aortic valve endocarditis.  Patient was hospitalized from 7/12 to 12/25/2022 for a strep bovis TAVR endocarditis (Streptococcus infantarius) and was treated with 2 weeks gentamicin and transitioned to OPAT with Rocephin daily.  On prior admission, patient worked with PT, and it was thought that he would be functional at home at the time of discharge.  However, since discharge, wife unable to provide antibiotics and patient was too weak to transfer or walk safely and fell 5 times so he returned to the ER.   Patient was admitted with recurrent falls.  Patient will need to be placed and this is pending.  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   Chronic pain syndrome - Continue home morphine IR - Continue sertraline -Patient is also on Xanax.  Minimize mind altering medications.   Hypothyroidism, acquired - Resume levothyroxine   Hypokalemia Supplemented and resolved 12/31/2022: Potassium is 4.6 today.   Physical  deconditioning - PT/OT/TOC   Essential hypertension Blood pressure controlled - Continue carvedilol, losartan   Hyponatremia Asymptomatic Urinalysis revealed specific gravity of 1.014. Urine sodium is not reliable.  Ranges from 11-75. Urine and serum osmolality are both 292. May need to repeat above studies at some point.   Persistent atrial fibrillation (HCC) - Continue home Coreg and Eliquis   COPD mixed type (HCC) No active flare.  FEV1 56%. - Continue ICS/LAMA   Mixed hyperlipidemia - Continue Crestor   Diabetes mellitus type 2, controlled, with complications (HCC) Diet controlled in the setting of weight loss - Defer insulin   Worsening serum creatinine/AKI, mild: -Suspect secondary to volume depletion -Check urinalysis -Check urine sodium -Normal saline 50 cc/h 01/02/2023: Resolved.  Serum creatinine is down to 1.  Mild pruritus Benadryl ordered every 6 hours as needed  DVT prophylaxis:apixaban Code Status: Full Family Communication: None at bedside Disposition Plan: Awaiting SNF placement per [facility] Status is: Inpatient Remains inpatient appropriate because: Need for placement.   Consultants:  None  Procedures:  None  Antimicrobials:  Anti-infectives (From admission, onward)    Start     Dose/Rate Route Frequency Ordered Stop   12/30/22 1500  cefTRIAXone (ROCEPHIN) 2 g in sodium chloride 0.9 % 100 mL IVPB        2 g 200 mL/hr over 30 Minutes Intravenous Every 24 hours 12/29/22 1716     12/29/22 1300  cefTRIAXone (ROCEPHIN) 2 g in sodium chloride 0.9 % 100 mL IVPB        2 g 200 mL/hr over 30 Minutes Intravenous  Once 12/29/22 1253 12/29/22 1336  Subjective: Patient seen and evaluated today with complaints of some itching to his back as well as legs and has requested some Benadryl for this.  Denies any other complaints or concerns.  Currently awaiting placement.  Objective: Vitals:   01/04/23 1948 01/05/23 0032 01/05/23 0039 01/05/23 0425   BP: (!) 106/58 (!) 153/134 (!) 109/53 130/63  Pulse: (!) 59 (!) 58 (!) 58 (!) 52  Resp: 18 18  18   Temp: 97.9 F (36.6 C) 98.3 F (36.8 C)  98.1 F (36.7 C)  TempSrc: Oral Oral  Oral  SpO2: 97% 99% 100% 100%  Weight:    78.6 kg  Height:        Intake/Output Summary (Last 24 hours) at 01/05/2023 0651 Last data filed at 01/05/2023 0543 Gross per 24 hour  Intake 3694.71 ml  Output 1550 ml  Net 2144.71 ml   Filed Weights   12/29/22 1229 01/01/23 0430 01/05/23 0425  Weight: 81.2 kg 78.6 kg 78.6 kg    Examination:  General exam: Appears calm and comfortable  Respiratory system: Clear to auscultation. Respiratory effort normal. Cardiovascular system: S1 & S2 heard, RRR.  Gastrointestinal system: Abdomen is soft Central nervous system: Alert and awake Extremities: No edema Skin: No significant lesions noted Psychiatry: Flat affect.    Data Reviewed: I have personally reviewed following labs and imaging studies  CBC: Recent Labs  Lab 12/29/22 1608 12/29/22 2340 12/31/22 0559  WBC 11.8* 11.5* 11.0*  NEUTROABS 7.9*  --   --   HGB 9.6* 10.9* 10.1*  HCT 31.4* 34.3* 32.9*  MCV 78.7* 76.2* 78.1*  PLT 340 390 333   Basic Metabolic Panel: Recent Labs  Lab 12/29/22 2340 12/31/22 0559 01/01/23 0516 01/02/23 0442 01/04/23 1439  NA 129* 128* 128* 131* 132*  K 4.4 4.6 4.2 5.0 4.3  CL 99 96* 98 101 99  CO2 21* 25 23 23 25   GLUCOSE 113* 104* 143* 99 133*  BUN 17 32* 37* 29* 27*  CREATININE 0.80 1.06 1.34* 1.00 1.02  CALCIUM 9.1 8.9 8.6* 9.0 8.6*  PHOS  --   --  4.0 3.3 3.2   GFR: Estimated Creatinine Clearance: 72.8 mL/min (by C-G formula based on SCr of 1.02 mg/dL). Liver Function Tests: Recent Labs  Lab 12/31/22 0559 01/01/23 0516 01/02/23 0442 01/04/23 1439  AST 31  --   --   --   ALT 55*  --   --   --   ALKPHOS 94  --   --   --   BILITOT 0.5  --   --   --   PROT 7.6  --   --   --   ALBUMIN 2.7* 2.4* 2.6* 2.6*   No results for input(s): "LIPASE",  "AMYLASE" in the last 168 hours. No results for input(s): "AMMONIA" in the last 168 hours. Coagulation Profile: No results for input(s): "INR", "PROTIME" in the last 168 hours. Cardiac Enzymes: No results for input(s): "CKTOTAL", "CKMB", "CKMBINDEX", "TROPONINI" in the last 168 hours. BNP (last 3 results) No results for input(s): "PROBNP" in the last 8760 hours. HbA1C: No results for input(s): "HGBA1C" in the last 72 hours. CBG: No results for input(s): "GLUCAP" in the last 168 hours. Lipid Profile: No results for input(s): "CHOL", "HDL", "LDLCALC", "TRIG", "CHOLHDL", "LDLDIRECT" in the last 72 hours. Thyroid Function Tests: No results for input(s): "TSH", "T4TOTAL", "FREET4", "T3FREE", "THYROIDAB" in the last 72 hours. Anemia Panel: No results for input(s): "VITAMINB12", "FOLATE", "FERRITIN", "TIBC", "IRON", "RETICCTPCT" in the last 72  hours. Sepsis Labs: No results for input(s): "PROCALCITON", "LATICACIDVEN" in the last 168 hours.  No results found for this or any previous visit (from the past 240 hour(s)).       Radiology Studies: No results found.      Scheduled Meds:  amLODipine  5 mg Oral Daily   apixaban  5 mg Oral BID   carvedilol  6.25 mg Oral BID WC   Chlorhexidine Gluconate Cloth  6 each Topical Daily   cholecalciferol  5,000 Units Oral Daily   feeding supplement  237 mL Oral BID BM   levothyroxine  25 mcg Oral Q0600   lidocaine  1 patch Transdermal Q24H   losartan  100 mg Oral Daily   mometasone-formoterol  2 puff Inhalation BID   multivitamin with minerals  1 tablet Oral QHS   rosuvastatin  40 mg Oral Daily   sertraline  150 mg Oral Daily   sodium chloride flush  10-40 mL Intracatheter Q12H   Continuous Infusions:  sodium chloride 50 mL/hr at 01/05/23 0543   sodium chloride 50 mL/hr at 01/04/23 1309   cefTRIAXone (ROCEPHIN)  IV 2 g (01/04/23 1504)     LOS: 5 days    Time spent: 35 minutes    Naina Sleeper Hoover Brunette, DO [facility]  If  7PM-7AM, please contact night-coverage [email or web address] 01/05/2023, 6:51 AM

## 2023-01-05 NOTE — TOC Progression Note (Signed)
Transition of Care [facility]) - Progression Note    Patient Details  Name: [name] MRN: [id number] Date of Birth: 12-28-1950  Transition of Care [facility]) CM/SW Contact  Janae Bridgeman, RN Phone Number: 01/05/2023, 4:40 PM  Clinical Narrative:    CM met with the patient and wife at the bedside to offer Medicare choice regarding patient's available SNF beds.  I offered answers to many of the patient's questions around SNF beds available to the patient.  The patient asked that I called [facility] since the facility was closer to home for the wife.  I called Revonda Standard, CM with [facility] and she is reviewing the clinicals.   Expected Discharge Plan: Skilled Nursing Facility Barriers to Discharge: Continued Medical Work up  Expected Discharge Plan and Services       Living arrangements for the past 2 months: Single Family Home                             HH Agency: [facility]         Social Determinants of Health (SDOH) Interventions SDOH Screenings   Food Insecurity: No Food Insecurity (12/29/2022)  Housing: Low Risk  (12/29/2022)  Transportation Needs: No Transportation Needs (12/29/2022)  Utilities: Not At Risk (12/29/2022)  Tobacco Use: Medium Risk (12/29/2022)    Readmission Risk Interventions     No data to display

## 2023-01-06 DIAGNOSIS — I330 Acute and subacute infective endocarditis: Secondary | ICD-10-CM | POA: Diagnosis not present

## 2023-01-06 NOTE — Progress Notes (Signed)
Physical Therapy Treatment Patient Details Name: [name] MRN: [id number] DOB: [birth year] Today's Date: 01/06/2023   History of Present Illness Pt is a 72 y.o. M who presents 12/29/2022 with inability to take care of themselves at home. Pt just discharged with a PICC line for IV antibiotics for endocarditis. Significant PMH: COPD, mild dementia, CVA, idiopathic peripheral autonomic neuropathy (Agent Orange), PAF, PTSD, TAVR, seizure.    PT Comments  Pt greeted resting in bed and agreeable to session. Pt needing up to min A to complete bed mobility to elevate trunk to come to sitting EOB and increased time and sequencing cues to complete. Pt requiring min A to rise to stand from slightly elevated EOB and recliner. Pt ambulating in room for x1 bout with contact guard assist for safety and RW for support with no overt LOB noted, distance limited to pt stated pain in RLE and noted increased difficulty advancing RLE. Pt with fair tolerance for standing LE therex however limited by pain. Current plan remains appropriate to address deficits and maximize functional independence and decrease caregiver burden. Pt continues to benefit from skilled PT services to progress toward functional mobility goals.      If plan is discharge home, recommend the following: A little help with walking and/or transfers;A little help with bathing/dressing/bathroom;Assistance with cooking/housework;Assist for transportation;Help with stairs or ramp for entrance   Can travel by private vehicle     Yes  Equipment Recommendations  None recommended by PT    Recommendations for Other Services       Precautions / Restrictions Precautions Precautions: Fall Precaution Comments: falls have all been backwards; sometimes dizzy, sometimes just off-balance Restrictions Weight Bearing Restrictions: No     Mobility  Bed Mobility Overal bed mobility: Needs Assistance Bed Mobility: Supine to Sit     Supine to sit: Min  assist     General bed mobility comments: cues for sequencing use of bed rail, min A needed to eleavte trunk    Transfers Overall transfer level: Needs assistance Equipment used: Rolling walker (2 wheels) Transfers: Sit to/from Stand, Bed to chair/wheelchair/BSC Sit to Stand: Min assist           General transfer comment: EOB elevated slightly and cues for hand placement provided. light min A to power up from EOB adn recliner to steady and encourage anterior weight shift    Ambulation/Gait Ambulation/Gait assistance: Contact guard assist Gait Distance (Feet): 18 Feet Assistive device: Rolling walker (2 wheels) Gait Pattern/deviations: Step-through pattern, Decreased step length - right, Decreased stride length, Decreased dorsiflexion - right, Shuffle Gait velocity: decr     General Gait Details: contact guard with intermittent light assist to facilitate anterior lean and cues to weight shift into great toes.   Stairs             Wheelchair Mobility     Tilt Bed    Modified Rankin (Stroke Patients Only)       Balance Overall balance assessment: Needs assistance Sitting-balance support: Feet supported, No upper extremity supported Sitting balance-Leahy Scale: Good Sitting balance - Comments: seated on EOB   Standing balance support: Reliant on assistive device for balance, Bilateral upper extremity supported Standing balance-Leahy Scale: Poor Standing balance comment: posterrio lean in standing. cues for ant weight shift into big toes.                            Cognition Arousal: Alert Behavior During Therapy: [facility]  for tasks assessed/performed Overall Cognitive Status: History of cognitive impairments - at baseline                                 General Comments: Hx mild dementia - overall cog was [facility] for simple tasks.        Exercises General Exercises - Lower Extremity Ankle Circles/Pumps: AROM, Both, 10 reps,  Supine Heel Slides: AROM, Both, 10 reps, Supine Hip ABduction/ADduction: AROM, Both, 10 reps, Supine Hip Flexion/Marching: AROM, Right, Left, 10 reps, Standing    General Comments General comments (skin integrity, edema, etc.): VSS on RA      Pertinent Vitals/Pain Pain Assessment Pain Assessment: Faces Faces Pain Scale: Hurts little more Pain Location: RLE Pain Descriptors / Indicators: Cramping, Sore Pain Intervention(s): Monitored during session, Limited activity within patient's tolerance, Patient requesting pain meds-RN notified, Repositioned    Home Living                          Prior Function            PT Goals (current goals can now be found in the care plan section) Acute Rehab PT Goals Patient Stated Goal: agreeable to rehab PT Goal Formulation: With patient/family Time For Goal Achievement: 01/12/23 Progress towards PT goals: Progressing toward goals    Frequency    Min 1X/week      PT Plan Current plan remains appropriate    Co-evaluation              AM-PAC PT "6 Clicks" Mobility   Outcome Measure  Help needed turning from your back to your side while in a flat bed without using bedrails?: A Little Help needed moving from lying on your back to sitting on the side of a flat bed without using bedrails?: A Little Help needed moving to and from a bed to a chair (including a wheelchair)?: A Little Help needed standing up from a chair using your arms (e.g., wheelchair or bedside chair)?: A Little Help needed to walk in hospital room?: A Little Help needed climbing 3-5 steps with a railing? : Total 6 Click Score: 16    End of Session Equipment Utilized During Treatment: Gait belt Activity Tolerance: Patient tolerated treatment well Patient left: in chair;with call bell/phone within reach Nurse Communication: Mobility status PT Visit Diagnosis: Repeated falls (R29.6);Other abnormalities of gait and mobility (R26.89);History of falling  (Z91.81);Unsteadiness on feet (R26.81)     Time: 1610-9604 PT Time Calculation (min) (ACUTE ONLY): 16 min  Charges:    $Therapeutic Exercise: 8-22 mins PT General Charges $$ ACUTE PT VISIT: 1 Visit                     Maryfer Tauzin R. PTA Acute Rehabilitation Services Office: [phone]   Catalina Antigua 01/06/2023, 10:38 AM

## 2023-01-06 NOTE — TOC Progression Note (Addendum)
Transition of Care [facility]) - Progression Note    Patient Details  Name: [name] MRN: [id number] Date of Birth: [birth year]  [facility]) CM/SW Contact  Janae Bridgeman, RN Phone Number: 01/06/2023, 9:10 AM  Clinical Narrative:    Revonda Standard, CM with [facility] has provided bed offers at [facility] location and [place] location.  MSW to follow up with family regarding SNF offers and choice.   Expected Discharge Plan: Skilled Nursing Facility Barriers to Discharge: Continued Medical Work up  Expected Discharge Plan and Services       Living arrangements for the past 2 months: Single Family Home                             HH Agency: [facility]         Social Determinants of Health (SDOH) Interventions SDOH Screenings   Food Insecurity: No Food Insecurity (12/29/2022)  Housing: Low Risk  (12/29/2022)  Transportation Needs: No Transportation Needs (12/29/2022)  Utilities: Not At Risk (12/29/2022)  Tobacco Use: Medium Risk (12/29/2022)    Readmission Risk Interventions     No data to display

## 2023-01-06 NOTE — TOC Progression Note (Signed)
Transition of Care [facility]) - Progression Note    Patient Details  Name: [name] MRN: [id number] Date of Birth: 04/23/1951  Transition of Care [facility]) CM/SW Contact  Lorcan Shelp A [place], [place] Phone Number: 01/06/2023, 5:26 PM  Clinical Narrative:     CSW met with pt and wife at bedside to discuss bed offers. They informed CSW that pt wanted [facility] and [address] second.   CSW reached out to Destiny at [facility] and she informed CSW that she could not extend a bed offer.   CSW then met pt at bedside, as wife was no longer in the room. CSW stated that [facility] could not offer bed and asked if pt would be ok going to [facility]. He stated that was fine and he would call and update his wife to let her know. Next steps, insurance authorization and update [facility] of bed selection.   TOC will continue to follow.    Expected Discharge Plan: Skilled Nursing Facility Barriers to Discharge: Other (must enter comment) (pt/family not deciding on facility)  Expected Discharge Plan and Services       Living arrangements for the past 2 months: Single Family Home                             HH Agency: [facility]         Social Determinants of Health (SDOH) Interventions SDOH Screenings   Food Insecurity: No Food Insecurity (12/29/2022)  Housing: Low Risk  (12/29/2022)  Transportation Needs: No Transportation Needs (12/29/2022)  Utilities: Not At Risk (12/29/2022)  Tobacco Use: Medium Risk (12/29/2022)    Readmission Risk Interventions     No data to display

## 2023-01-06 NOTE — Progress Notes (Signed)
PROGRESS NOTE    [name]  ZSW:[id number] DOB: 08-Sep-1950 DOA: 12/29/2022 PCP: [name], MD   Brief Narrative:  72 year old Caucasian male with past medical history significant for atrial fibrillation on Eliquis, history of CVA, hypertension, diabetes mellitus, COPD, mild dementia, TAVR and recent TAVR associated aortic valve endocarditis.  Patient was hospitalized from 7/12 to 12/25/2022 for a strep bovis TAVR endocarditis (Streptococcus infantarius) and was treated with 2 weeks gentamicin and transitioned to OPAT with Rocephin daily.  On prior admission, patient worked with PT, and it was thought that he would be functional at home at the time of discharge.  However, since discharge, wife unable to provide antibiotics and patient was too weak to transfer or walk safely and fell 5 times so he returned to the ER.   Patient was admitted with recurrent falls.  Patient will need to be placed and this is pending.   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  Outpatient follow-up with ID. - Weekly CBC with differential and CMP. -Currently hemodynamically stable.  Afebrile.  Dementia without behavioral disturbance (HCC): -Stable.   Chronic pain syndrome - Continue home morphine IR as needed.  Continue lidocaine patch. - Continue sertraline - Patient is also on Xanax.  Minimize sedative medications. -Outpatient follow-up with PCP/pain management   Hypothyroidism, acquired - Continue levothyroxine   Hypokalemia - resolved   Physical deconditioning - PT/OT recommend SNF placement.  TOC following.  Currently medically stable for discharge.   Essential hypertension -Blood pressure controlled - Continue amlodipine, carvedilol, losartan   Hyponatremia -Asymptomatic -Mild.  Sodium 131 today.  Monitoring  intermittently.  Encourage oral intake.   Persistent atrial fibrillation (HCC) - Continue Coreg and Eliquis.  Mild intermittent bradycardia present   COPD mixed type (HCC) -No active flare.  FEV1 56%. - Continue ICS/LAMA -Currently on room air   Mixed hyperlipidemia - Continue Crestor   Diabetes mellitus type 2, controlled, with complications (HCC) -Diet controlled in the setting of weight loss - Continue carb modified diet.   AKI -Suspect secondary to volume depletion -Resolved with IV fluids.  Creatinine 0.78 today.  Monitor intermittently.  Off IV fluids.     DVT prophylaxis: apixaban Code Status: Full Family Communication: None at bedside Disposition Plan: Status is: Inpatient Remains inpatient appropriate because: Of severity of illness  Consultants: None  Procedures: None  Antimicrobials:  Anti-infectives (From admission, onward)    Start     Dose/Rate Route Frequency Ordered Stop   12/30/22 1500  cefTRIAXone (ROCEPHIN) 2 g in sodium chloride 0.9 % 100 mL IVPB        2 g 200 mL/hr over 30 Minutes Intravenous Every 24 hours 12/29/22 1716     12/29/22 1300  cefTRIAXone (ROCEPHIN) 2 g in sodium chloride 0.9 % 100 mL IVPB        2 g 200 mL/hr over 30 Minutes Intravenous  Once 12/29/22 1253 12/29/22 1336        Subjective: Patient seen and examined at bedside.  Denies any overnight fever, nausea, vomiting.  Objective: Vitals:   01/05/23 1535 01/05/23 2035 01/06/23 0345 01/06/23 0750  BP: (!) 102/51 (!) 101/57 117/63 (!) 141/60  Pulse: (!) 51 (!) 54  (!) 51  Resp: 18 18 18 17   Temp: 98 F (36.7 C) 98.7 F (37.1 C) 97.8 F (36.6 C)  97.7 F (36.5 C)  TempSrc: Oral   Oral  SpO2: 100% 98% 100% 100%  Weight:      Height:        Intake/Output Summary (Last 24 hours) at 01/06/2023 0929 Last data filed at 01/06/2023 0849 Gross per 24 hour  Intake 640 ml  Output 2650 ml  Net -2010 ml   Filed Weights   12/29/22 1229 01/01/23 0430 01/05/23 0425  Weight:  81.2 kg 78.6 kg 78.6 kg    Examination:  General exam: Appears calm and comfortable.  On room air. Respiratory system: Bilateral decreased breath sounds at bases with some scattered crackles Cardiovascular system: S1 & S2 heard, mild intermittent bradycardia present Gastrointestinal system: Abdomen is nondistended, soft and nontender. Normal bowel sounds heard. Extremities: No cyanosis, clubbing; trace lower extremity edema present Central nervous system: Alert and oriented.  Slow to respond.  Poor historian.  No focal neurological deficits. Moving extremities Skin: No rashes, lesions or ulcers Psychiatry: Not agitated.  Flat affect currently.     Data Reviewed: I have personally reviewed following labs and imaging studies  CBC: Recent Labs  Lab 12/31/22 0559  WBC 11.0*  HGB 10.1*  HCT 32.9*  MCV 78.1*  PLT 333   Basic Metabolic Panel: Recent Labs  Lab 12/31/22 0559 01/01/23 0516 01/02/23 0442 01/04/23 1439 01/06/23 0505  NA 128* 128* 131* 132* 131*  K 4.6 4.2 5.0 4.3 4.4  CL 96* 98 101 99 98  CO2 25 23 23 25 23   GLUCOSE 104* 143* 99 133* 102*  BUN 32* 37* 29* 27* 20  CREATININE 1.06 1.34* 1.00 1.02 0.78  CALCIUM 8.9 8.6* 9.0 8.6* 9.0  MG  --   --   --   --  2.1  PHOS  --  4.0 3.3 3.2  --    GFR: Estimated Creatinine Clearance: 92.8 mL/min (by C-G formula based on SCr of 0.78 mg/dL). Liver Function Tests: Recent Labs  Lab 12/31/22 0559 01/01/23 0516 01/02/23 0442 01/04/23 1439  AST 31  --   --   --   ALT 55*  --   --   --   ALKPHOS 94  --   --   --   BILITOT 0.5  --   --   --   PROT 7.6  --   --   --   ALBUMIN 2.7* 2.4* 2.6* 2.6*   No results for input(s): "LIPASE", "AMYLASE" in the last 168 hours. No results for input(s): "AMMONIA" in the last 168 hours. Coagulation Profile: No results for input(s): "INR", "PROTIME" in the last 168 hours. Cardiac Enzymes: No results for input(s): "CKTOTAL", "CKMB", "CKMBINDEX", "TROPONINI" in the last 168  hours. BNP (last 3 results) No results for input(s): "PROBNP" in the last 8760 hours. HbA1C: No results for input(s): "HGBA1C" in the last 72 hours. CBG: No results for input(s): "GLUCAP" in the last 168 hours. Lipid Profile: No results for input(s): "CHOL", "HDL", "LDLCALC", "TRIG", "CHOLHDL", "LDLDIRECT" in the last 72 hours. Thyroid Function Tests: No results for input(s): "TSH", "T4TOTAL", "FREET4", "T3FREE", "THYROIDAB" in the last 72 hours. Anemia Panel: No results for input(s): "VITAMINB12", "FOLATE", "FERRITIN", "TIBC", "IRON", "RETICCTPCT" in the last 72 hours. Sepsis Labs: No results for input(s): "PROCALCITON", "LATICACIDVEN" in the last 168 hours.  No results found for this or any previous visit (from the past 240 hour(s)).       Radiology Studies: No results found.      Scheduled Meds:  amLODipine  5 mg  Oral Daily   apixaban  5 mg Oral BID   carvedilol  6.25 mg Oral BID WC   Chlorhexidine Gluconate Cloth  6 each Topical Daily   cholecalciferol  5,000 Units Oral Daily   feeding supplement  237 mL Oral BID BM   levothyroxine  25 mcg Oral Q0600   lidocaine  1 patch Transdermal Q24H   losartan  100 mg Oral Daily   mometasone-formoterol  2 puff Inhalation BID   multivitamin with minerals  1 tablet Oral QHS   rosuvastatin  40 mg Oral Daily   sertraline  150 mg Oral Daily   sodium chloride flush  10-40 mL Intracatheter Q12H   Continuous Infusions:  cefTRIAXone (ROCEPHIN)  IV 2 g (01/05/23 1417)          Glade Lloyd, MD [facility] 01/06/2023, 9:29 AM

## 2023-01-06 NOTE — TOC Progression Note (Addendum)
Transition of Care [facility]) - Progression Note    Patient Details  Name: [name] MRN: [id number] Date of Birth: 08-Jan-1951  [facility]) CM/SW Contact  Erin Sons, [place] Phone Number: 01/06/2023, 12:32 PM  Clinical Narrative:     CSW met with pt bedside to follow up regarding request for [facility]. CSW explained that [facility] and [facility] can offer a bed for pt. Pt states CSW would need to talk to his wife.   CSW called wife and discussed disposition on Speakerphone with pt and wife. Wife is interested in [facility], CSW explained they did not offer a bed. CSW explained that [facility] is a 4 star facility that has offered a bed though pt is focused that [facility] has not offered a bed. CSW explained that pt would need to choose a facility that has offered a bed. Pt continues to insist that the list is wife received from [facility] of in network facilities does not reflect his his current bed offers. CSW explained multiple times that just because a facility is in network with [employer], does not mean they have agreed to accept pt at their facility.   Pt currently has 4 SNF options to choose from. CSW explains a choice would need to be made as pt is currently medically stable for DC. CSW agrees to reach out to [facility] to double check on if they can offer a bed. Pt spouse is agreeable to rep from [facility] to give her a call an discuss their buildings.   CSW requested [facility] review referral. [facility] liaison agreed to call pt's spouse.   1540 CSW contacted [facility] for update; awating response.   Expected Discharge Plan: Skilled Nursing Facility Barriers to Discharge: Other (must enter comment) (pt/family not deciding on facility)  Expected Discharge Plan and Services       Living arrangements for the past 2 months: Single Family Home                             HH Agency: [facility]          Social Determinants of Health (SDOH) Interventions SDOH Screenings   Food Insecurity: No Food Insecurity (12/29/2022)  Housing: Low Risk  (12/29/2022)  Transportation Needs: No Transportation Needs (12/29/2022)  Utilities: Not At Risk (12/29/2022)  Tobacco Use: Medium Risk (12/29/2022)    Readmission Risk Interventions     No data to display

## 2023-01-07 DIAGNOSIS — I330 Acute and subacute infective endocarditis: Secondary | ICD-10-CM | POA: Diagnosis not present

## 2023-01-07 MED ORDER — LORATADINE 10 MG PO TABS
10.0000 mg | ORAL_TABLET | Freq: Every day | ORAL | Status: DC
  Administered 2023-01-07 – 2023-01-08 (×2): 10 mg via ORAL
  Filled 2023-01-07 (×2): qty 1

## 2023-01-07 NOTE — Progress Notes (Signed)
Occupational Therapy Treatment Patient Details Name: [name] MRN: [id number] DOB: 08/07/1950 Today's Date: 01/07/2023   History of present illness Pt is a 72 y.o. M who presents 12/29/2022 with inability to take care of themselves at home. Pt just discharged with a PICC line for IV antibiotics for endocarditis. Significant PMH: COPD, mild dementia, CVA, idiopathic peripheral autonomic neuropathy (Agent Orange), PAF, PTSD, TAVR, seizure.   OT comments  Patient continues to demonstrate good progress with OT treatment with bed mobility, transfers and LB dressing. Patient continues to be limited by pain but is motivated to increase strength and functional to allow for safe return home. Patient will benefit from continued inpatient follow up therapy, <3 hours/day. Acute OT to continue to follow.       If plan is discharge home, recommend the following:  A little help with walking and/or transfers;A lot of help with bathing/dressing/bathroom;Assistance with cooking/housework;Assist for transportation;Help with stairs or ramp for entrance   Equipment Recommendations  None recommended by OT    Recommendations for Other Services      Precautions / Restrictions Precautions Precautions: Fall Precaution Comments: falls have all been backwards; sometimes dizzy, sometimes just off-balance Restrictions Weight Bearing Restrictions: No       Mobility Bed Mobility Overal bed mobility: Needs Assistance Bed Mobility: Supine to Sit     Supine to sit: Min assist     General bed mobility comments: increased time and cues for bed rail use and min assist    Transfers Overall transfer level: Needs assistance Equipment used: Rolling walker (2 wheels) Transfers: Sit to/from Stand, Bed to chair/wheelchair/BSC Sit to Stand: Min assist     Step pivot transfers: Min assist     General transfer comment: cues for hand placement and min assist to power up     Balance Overall balance  assessment: Needs assistance Sitting-balance support: Feet supported, No upper extremity supported Sitting balance-Leahy Scale: Good   Postural control: Posterior lean Standing balance support: Reliant on assistive device for balance, Bilateral upper extremity supported, Single extremity supported Standing balance-Leahy Scale: Poor Standing balance comment: able to perform reaching tasks while standing to simulate standing ADLs with limited standing tolerance and CGA for balance                           ADL either performed or assessed with clinical judgement   ADL Overall ADL's : Needs assistance/impaired     Grooming: Set up;Sitting           Upper Body Dressing : Set up;Sitting Upper Body Dressing Details (indicate cue type and reason): changed gown Lower Body Dressing: Minimal assistance;Sitting/lateral leans Lower Body Dressing Details (indicate cue type and reason): to change socks Toilet Transfer: Minimal assistance;Rolling walker (2 wheels) Toilet Transfer Details (indicate cue type and reason): simulated to recliner           General ADL Comments: declined standing at sink due to pain    Extremity/Trunk Assessment              Vision       Perception     Praxis      Cognition Arousal: Alert Behavior During Therapy: WFL for tasks assessed/performed Overall Cognitive Status: History of cognitive impairments - at baseline                                 General  Comments: able to follow directions, limited by pain        Exercises      Shoulder Instructions       General Comments reviewed fall prevention strategies    Pertinent Vitals/ Pain       Pain Assessment Pain Assessment: 0-10 Faces Pain Scale: Hurts little more Pain Location: RLE Pain Descriptors / Indicators: Cramping, Sore Pain Intervention(s): Limited activity within patient's tolerance, Monitored during session, Repositioned, Patient requesting pain  meds-RN notified  Home Living                                          Prior Functioning/Environment              Frequency  Min 2X/week        Progress Toward Goals  OT Goals(current goals can now be found in the care plan section)  Progress towards OT goals: Progressing toward goals  Acute Rehab OT Goals Patient Stated Goal: get stronger in rehab to return home safely OT Goal Formulation: With patient Time For Goal Achievement: 01/13/23 Potential to Achieve Goals: Good ADL Goals Pt Will Perform Grooming: with supervision;standing Pt Will Perform Lower Body Dressing: with supervision;sit to/from stand Pt Will Transfer to Toilet: with supervision;ambulating Additional ADL Goal #1: pt will indep recall at least 3 fall prevention strategies to apply at discharge Additional ADL Goal #2: Pt will navigate hospital environment without safety concerns in preparation for safe discharge  Plan Discharge plan remains appropriate    Co-evaluation                 AM-PAC OT "6 Clicks" Daily Activity     Outcome Measure   Help from another person eating meals?: None Help from another person taking care of personal grooming?: A Little Help from another person toileting, which includes using toliet, bedpan, or urinal?: A Little Help from another person bathing (including washing, rinsing, drying)?: A Little Help from another person to put on and taking off regular upper body clothing?: A Little Help from another person to put on and taking off regular lower body clothing?: A Lot 6 Click Score: 18    End of Session Equipment Utilized During Treatment: Gait belt;Rolling walker (2 wheels)  OT Visit Diagnosis: Unsteadiness on feet (R26.81);Other abnormalities of gait and mobility (R26.89);Repeated falls (R29.6);Muscle weakness (generalized) (M62.81);History of falling (Z91.81);Pain Pain - Right/Left: Right Pain - part of body: Leg   Activity Tolerance  Patient tolerated treatment well   Patient Left in chair;with call bell/phone within reach   Nurse Communication Mobility status;Patient requests pain meds        Time: 0833-0903 OT Time Calculation (min): 30 min  Charges: OT General Charges $OT Visit: 1 Visit OT Treatments $Self Care/Home Management : 8-22 mins $Therapeutic Activity: 8-22 mins  Alfonse Flavors, OTA Acute Rehabilitation Services  Office [phone]   Dewain Penning 01/07/2023, 10:28 AM

## 2023-01-07 NOTE — TOC Progression Note (Signed)
Transition of Care [facility]) - Progression Note    Patient Details  Name: [name] MRN: [id number] Date of Birth: [birth year]  Transition of [facility]) CM/SW Contact  Sherrilynn Gudgel A [place], [place] Phone Number: 01/07/2023, 2:19 PM  Clinical Narrative:     CSW completed insurance authorization for pt.   Pt's Berkley Harvey was approved. Certificate ID #[id number].  [facility] stated barring any facility issues due to inclement weather, bed is available tomorrow.   TOC will continue to follow.    Expected Discharge Plan: Skilled Nursing Facility Barriers to Discharge: Other (must enter comment) (pt/family not deciding on facility)  Expected Discharge Plan and Services       Living arrangements for the past 2 months: Single Family Home                             HH Agency: [facility]         Social Determinants of Health (SDOH) Interventions SDOH Screenings   Food Insecurity: No Food Insecurity (12/29/2022)  Housing: Low Risk  (12/29/2022)  Transportation Needs: No Transportation Needs (12/29/2022)  Utilities: Not At Risk (12/29/2022)  Tobacco Use: Medium Risk (12/29/2022)    Readmission Risk Interventions     No data to display

## 2023-01-07 NOTE — TOC Progression Note (Signed)
Transition of Care [facility]) - Progression Note    Patient Details  Name: [name] MRN: [id number] Date of Birth: 16-Feb-1951  Transition of Care [facility]) CM/SW Contact  Shaday Rayborn A [place], [place] Phone Number: 01/07/2023, 11:14 AM  Clinical Narrative:     CSW spoke with Revonda Standard at [facility]. She stated that due to the weather conditions she would not be able to take pt today but possibly tomorrow. CSW will reach back out tomorrow regarding bed availability.   CSW will start auth for pt today.   TOC will continue to follow.   Expected Discharge Plan: Skilled Nursing Facility Barriers to Discharge: Other (must enter comment) (pt/family not deciding on facility)  Expected Discharge Plan and Services       Living arrangements for the past 2 months: Single Family Home                             HH Agency: [facility]         Social Determinants of Health (SDOH) Interventions SDOH Screenings   Food Insecurity: No Food Insecurity (12/29/2022)  Housing: Low Risk  (12/29/2022)  Transportation Needs: No Transportation Needs (12/29/2022)  Utilities: Not At Risk (12/29/2022)  Tobacco Use: Medium Risk (12/29/2022)    Readmission Risk Interventions     No data to display

## 2023-01-07 NOTE — Plan of Care (Signed)

## 2023-01-07 NOTE — Progress Notes (Signed)
PROGRESS NOTE    [name]  WUJ:[id number] DOB: 1950/08/04 DOA: 12/29/2022 PCP: [name], MD   Brief Narrative:  72 year old Caucasian male with past medical history significant for atrial fibrillation on Eliquis, history of CVA, hypertension, diabetes mellitus, COPD, mild dementia, TAVR and recent TAVR associated aortic valve endocarditis.  Patient was hospitalized from 7/12 to 12/25/2022 for a strep bovis TAVR endocarditis (Streptococcus infantarius) and was treated with 2 weeks gentamicin and transitioned to OPAT with Rocephin daily.  On prior admission, patient worked with PT, and it was thought that he would be functional at home at the time of discharge.  However, since discharge, wife unable to provide antibiotics and patient was too weak to transfer or walk safely and fell 5 times so he returned to the ER.   Patient was admitted with recurrent falls.  Patient will need to be placed and this is pending.  He is currently medically stable for discharge to SNF.  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  Outpatient follow-up with ID. - Weekly CBC with differential and CMP. -Currently hemodynamically stable.  Afebrile.  Dementia without behavioral disturbance (HCC): -Stable.   Chronic pain syndrome - Continue home morphine IR as needed.  Continue lidocaine patch. - Continue sertraline - Patient is also on Xanax.  Minimize sedative medications. -Outpatient follow-up with PCP/pain management   Hypothyroidism, acquired - Continue levothyroxine   Hypokalemia - resolved   Physical deconditioning - PT/OT recommend SNF placement.  TOC following.  Currently medically stable for discharge.   Essential hypertension -Blood pressure controlled - Continue amlodipine, carvedilol, losartan    Hyponatremia -Asymptomatic -Mild.  Sodium 131 on 01/06/2023.  Monitoring intermittently.  Encourage oral intake.   Persistent atrial fibrillation (HCC) - Continue Coreg and Eliquis.  Mild intermittent bradycardia present   COPD mixed type (HCC) -No active flare.  FEV1 56%. - Continue ICS/LAMA -Currently on room air   Mixed hyperlipidemia - Continue Crestor   Diabetes mellitus type 2, controlled, with complications (HCC) -Diet controlled  - Continue carb modified diet.   AKI -Suspect secondary to volume depletion -Resolved with IV fluids.  Creatinine 0.78 on 01/06/2023.  Monitor intermittently.  Off IV fluids.     DVT prophylaxis: apixaban Code Status: Full Family Communication: None at bedside Disposition Plan: Status is: Inpatient Remains inpatient appropriate because: Of severity of illness  Consultants: None  Procedures: None  Antimicrobials:  Anti-infectives (From admission, onward)    Start     Dose/Rate Route Frequency Ordered Stop   12/30/22 1500  cefTRIAXone (ROCEPHIN) 2 g in sodium chloride 0.9 % 100 mL IVPB        2 g 200 mL/hr over 30 Minutes Intravenous Every 24 hours 12/29/22 1716     12/29/22 1300  cefTRIAXone (ROCEPHIN) 2 g in sodium chloride 0.9 % 100 mL IVPB        2 g 200 mL/hr over 30 Minutes Intravenous  Once 12/29/22 1253 12/29/22 1336        Subjective: Patient seen and examined at bedside.  Poor historian.  No seizures, fever, vomiting reported.  Objective: Vitals:   01/06/23 2107 01/06/23 2136 01/07/23 0544 01/07/23 0738  BP:  (!) 107/58 122/63 118/62  Pulse:  63 (!) 58 (!) 52  Resp:  16 16   Temp:  98.8 F (37.1 C) 98 F (  36.7 C)   TempSrc:      SpO2: 95% 98% 100% 100%  Weight:      Height:        Intake/Output Summary (Last 24 hours) at 01/07/2023 0739 Last data filed at 01/06/2023 1752 Gross per 24 hour  Intake --  Output 1200 ml  Net -1200 ml   Filed Weights   12/29/22 1229 01/01/23 0430 01/05/23 0425  Weight: 81.2 kg  78.6 kg 78.6 kg    Examination:  General: Currently on room air.  No distress.  Chronically ill and deconditioned looking respiratory: Decreased breath sounds at bases bilaterally with some crackles CVS: Intermittently bradycardic; S1-S2 heard  abdominal: Soft, nontender, slightly distended, no organomegaly; bowel sounds are heard  extremities: Mild lower extremity edema; no clubbing.       Data Reviewed: I have personally reviewed following labs and imaging studies  CBC: No results for input(s): "WBC", "NEUTROABS", "HGB", "HCT", "MCV", "PLT" in the last 168 hours.  Basic Metabolic Panel: Recent Labs  Lab 01/01/23 0516 01/02/23 0442 01/04/23 1439 01/06/23 0505  NA 128* 131* 132* 131*  K 4.2 5.0 4.3 4.4  CL 98 101 99 98  CO2 23 23 25 23   GLUCOSE 143* 99 133* 102*  BUN 37* 29* 27* 20  CREATININE 1.34* 1.00 1.02 0.78  CALCIUM 8.6* 9.0 8.6* 9.0  MG  --   --   --  2.1  PHOS 4.0 3.3 3.2  --    GFR: Estimated Creatinine Clearance: 92.8 mL/min (by C-G formula based on SCr of 0.78 mg/dL). Liver Function Tests: Recent Labs  Lab 01/01/23 0516 01/02/23 0442 01/04/23 1439  ALBUMIN 2.4* 2.6* 2.6*   No results for input(s): "LIPASE", "AMYLASE" in the last 168 hours. No results for input(s): "AMMONIA" in the last 168 hours. Coagulation Profile: No results for input(s): "INR", "PROTIME" in the last 168 hours. Cardiac Enzymes: No results for input(s): "CKTOTAL", "CKMB", "CKMBINDEX", "TROPONINI" in the last 168 hours. BNP (last 3 results) No results for input(s): "PROBNP" in the last 8760 hours. HbA1C: No results for input(s): "HGBA1C" in the last 72 hours. CBG: No results for input(s): "GLUCAP" in the last 168 hours. Lipid Profile: No results for input(s): "CHOL", "HDL", "LDLCALC", "TRIG", "CHOLHDL", "LDLDIRECT" in the last 72 hours. Thyroid Function Tests: No results for input(s): "TSH", "T4TOTAL", "FREET4", "T3FREE", "THYROIDAB" in the last 72 hours. Anemia Panel: No  results for input(s): "VITAMINB12", "FOLATE", "FERRITIN", "TIBC", "IRON", "RETICCTPCT" in the last 72 hours. Sepsis Labs: No results for input(s): "PROCALCITON", "LATICACIDVEN" in the last 168 hours.  No results found for this or any previous visit (from the past 240 hour(s)).       Radiology Studies: No results found.      Scheduled Meds:  amLODipine  5 mg Oral Daily   apixaban  5 mg Oral BID   carvedilol  6.25 mg Oral BID WC   Chlorhexidine Gluconate Cloth  6 each Topical Daily   cholecalciferol  5,000 Units Oral Daily   feeding supplement  237 mL Oral BID BM   levothyroxine  25 mcg Oral Q0600   lidocaine  1 patch Transdermal Q24H   losartan  100 mg Oral Daily   mometasone-formoterol  2 puff Inhalation BID   multivitamin with minerals  1 tablet Oral QHS   rosuvastatin  40 mg Oral Daily   sertraline  150 mg Oral Daily   sodium chloride flush  10-40 mL Intracatheter Q12H   Continuous Infusions:  cefTRIAXone (ROCEPHIN)  IV 2 g (01/06/23 1546)          Glade Lloyd, MD [facility] 01/07/2023, 7:39 AM

## 2023-01-08 DIAGNOSIS — I330 Acute and subacute infective endocarditis: Secondary | ICD-10-CM | POA: Diagnosis not present

## 2023-01-08 MED ORDER — LEVOTHYROXINE SODIUM 25 MCG PO TABS: 25.0000 ug | ORAL_TABLET | Freq: Every day | ORAL | 0 refills | Status: DC

## 2023-01-08 MED ORDER — CARVEDILOL 12.5 MG PO TABS: 6.2500 mg | ORAL_TABLET | Freq: Two times a day (BID) | ORAL | Status: DC

## 2023-01-08 MED ORDER — LIDOCAINE 5 % EX PTCH: 1.0000 | MEDICATED_PATCH | CUTANEOUS | 0 refills | Status: DC

## 2023-01-08 MED ORDER — ALPRAZOLAM 0.5 MG PO TABS: 0.5000 mg | ORAL_TABLET | Freq: Three times a day (TID) | ORAL | 0 refills | Status: DC | PRN

## 2023-01-08 MED ORDER — MORPHINE SULFATE 15 MG PO TABS: 15.0000 mg | ORAL_TABLET | ORAL | 0 refills | Status: DC | PRN

## 2023-01-08 NOTE — Plan of Care (Signed)

## 2023-01-08 NOTE — Plan of Care (Signed)
  Problem: Education: Goal: Knowledge of General Education information will improve Description: Including pain rating scale, medication(s)/side effects and non-pharmacologic comfort measures Outcome: Progressing   Problem: Health Behavior/Discharge Planning: Goal: Ability to manage health-related needs will improve Outcome: Progressing   Problem: Activity: Goal: Risk for activity intolerance will decrease Outcome: Progressing   Problem: Nutrition: Goal: Adequate nutrition will be maintained Outcome: Progressing   Problem: Coping: Goal: Level of anxiety will decrease Outcome: Progressing   Problem: Elimination: Goal: Will not experience complications related to bowel motility Outcome: Progressing Goal: Will not experience complications related to urinary retention Outcome: Progressing   Problem: Pain Managment: Goal: General experience of comfort will improve Outcome: Progressing   Problem: Safety: Goal: Ability to remain free from injury will improve Outcome: Progressing   

## 2023-01-08 NOTE — TOC Transition Note (Signed)
Transition of Care [facility]) - CM/SW Discharge Note   Patient Details  Name: [name] MRN: [id number] Date of Birth: [birth year]  [facility] CM/SW Contact:  Jaqua Ching A [place], LCSWA Phone Number: 01/08/2023, 12:29 PM   Clinical Narrative:     Patient will DC to: [facility]   Anticipated DC date: 01/08/23  Family notified: Helane Gunther  Transport by: Sharin Mons      Per MD patient ready for DC to [facility]. RN, patient, patient's family, and facility notified of DC. Discharge Summary and FL2 sent to facility. RN to call report prior to discharge ( Room 211-2, number for report [phone] ). DC packet on chart. Ambulance transport requested for patient.     CSW will sign off for now as social work intervention is no longer needed. Please consult [place] again if new needs arise.   Final next level of care: Skilled Nursing Facility Barriers to Discharge: Barriers Resolved   Patient Goals and CMS Choice      Discharge Placement                Patient chooses bed at: [facility] Patient to be transferred to facility by: [facility] Name of family member notified: Lafrance Cristian Patient and family notified of of transfer: 01/08/23  Discharge Plan and Services Additional resources added to the After Visit Summary for                              [facility] Agency: [facility]        Social Determinants of Health (SDOH) Interventions SDOH Screenings   Food Insecurity: No Food Insecurity (12/29/2022)  Housing: Low Risk  (12/29/2022)  Transportation Needs: No Transportation Needs (12/29/2022)  Utilities: Not At Risk (12/29/2022)  Tobacco Use: Medium Risk (12/29/2022)     Readmission Risk Interventions     No data to display

## 2023-01-08 NOTE — Discharge Summary (Signed)
Physician Discharge Summary  [name] XLK:[id number] DOB: [birth year] DOA: 12/29/2022  PCP: [name], MD  Admit date: 12/29/2022 Discharge date: 01/08/2023  Admitted From: Home Disposition: SNF  Recommendations for Outpatient Follow-up:  Follow up with SNF provider at earliest convenience with repeat CBC with differential and CMP weekly  Outpatient follow-up with ID  follow up in ED if symptoms worsen or new appear   Home Health: No Equipment/Devices: None  Discharge Condition: Guarded  CODE STATUS: Full Diet recommendation: Heart healthy/carb modified  Brief/Interim Summary: 72 year old Caucasian male with past medical history significant for atrial fibrillation on Eliquis, history of CVA, hypertension, diabetes mellitus, COPD, mild dementia, TAVR and recent TAVR associated aortic valve endocarditis.  Patient was hospitalized from 7/12 to 12/25/2022 for a strep bovis TAVR endocarditis (Streptococcus infantarius) and was treated with 2 weeks gentamicin and transitioned to OPAT with Rocephin daily.  On prior admission, patient worked with PT, and it was thought that he would be functional at home at the time of discharge.  However, since discharge, wife unable to provide antibiotics and patient was too weak to transfer or walk safely and fell 5 times so he returned to the ER.   Patient was admitted with recurrent falls.  Patient will need to be placed and this is pending.  He is currently medically stable for discharge to SNF.  He will be discharged to SNF once bed is available.  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  Outpatient follow-up with ID. - Weekly CBC with differential and CMP. -Currently hemodynamically stable.  Afebrile. -Discharge to SNF once bed is available    Dementia without behavioral disturbance (HCC): -Stable.   Chronic pain syndrome - Continue home morphine IR as needed.  Continue lidocaine patch. - Continue sertraline - Patient is also on Xanax.  Minimize sedative medications. -Outpatient follow-up with PCP/pain management   Hypothyroidism, acquired - Continue levothyroxine   Hypokalemia - resolved   Physical deconditioning - PT/OT recommend SNF placement.  Currently medically stable for discharge.   Essential hypertension -Blood pressure intermittently on the lower side. - Continue losartan and low-dose of Coreg.  Hyponatremia -Asymptomatic -Mild.  Sodium 131 on 01/06/2023.  Monitoring intermittently as an outpatient.  Encourage oral intake.   Persistent atrial fibrillation (HCC) - Continue lower dose of Coreg; continue Eliquis.  Mild intermittent bradycardia present   COPD mixed type (HCC) -No active flare.  FEV1 56%. - Continue ICS/LAMA -Currently on room air.  Outpatient follow-up with PCP and/or pulmonary   Mixed hyperlipidemia - Continue Crestor   Diabetes mellitus type 2, controlled, with complications (HCC) -Diet controlled  - Continue carb modified diet.   AKI -Suspect secondary to volume depletion -Resolved with IV fluids.  Creatinine 0.78 on 01/06/2023.  Monitor intermittently as an outpatient.  Off IV fluids.      Discharge Instructions   Allergies as of 01/08/2023       Reactions   Bee Venom Anaphylaxis   Bystolic [nebivolol Hcl] Other (See Comments)   Nightmares, flashbacks (PTSD)   Codeine Shortness Of Breath, Rash   Doxycycline Shortness Of Breath, Rash   Iodinated Contrast Media Shortness Of Breath, Anaphylaxis   Lost consciousness/difficulty breathing Omni - Paque Contrast    Iohexol Hives, Shortness Of Breath, Rash   Neck and Torso erythemia IVP-DYE >> SHORTNESS OF BREATH  Levaquin [levofloxacin] Hives, Shortness Of Breath   Oxycodone Hcl Shortness Of Breath, Swelling, Rash   Stadol  [butorphanol] Shortness Of Breath   Succinylcholine Chloride Shortness Of Breath, Nausea Only, Rash, Other (See Comments)   Difficulty breathing   Tamiflu [oseltamivir Phosphate]    UNSPECIFIED REACTION    Atorvastatin    myalgias   Synvisc [hylan G-f 20] Hives, Rash   Gemfibrozil    Other reaction(s): Serum cholesterol raised   Latex Other (See Comments)   blister   Lisinopril    Other reaction(s): Cough   Omeprazole    Dilaudid [hydromorphone Hcl] Nausea Only, Rash   Aggressive   Methocarbamol Nausea Only, Rash   Niacin Rash   Pentazocine Lactate Rash   rash        Medication List     TAKE these medications    albuterol 108 (90 Base) MCG/ACT inhaler Commonly known as: VENTOLIN HFA Inhale 1 puff into the lungs every 4 (four) hours as needed for wheezing or shortness of breath.   albuterol (2.5 MG/3ML) 0.083% nebulizer solution Commonly known as: PROVENTIL Take 2.5 mg by nebulization every 6 (six) hours as needed for wheezing or shortness of breath.   ALPRAZolam 0.5 MG tablet Commonly known as: XANAX Take 1 tablet (0.5 mg total) by mouth 3 (three) times daily as needed for anxiety or sleep.   apixaban 5 MG Tabs tablet Commonly known as: ELIQUIS Take 5 mg by mouth 2 (two) times daily.   carvedilol 12.5 MG tablet Commonly known as: COREG Take 0.5 tablets (6.25 mg total) by mouth 2 (two) times daily with a meal. What changed: how much to take   cefTRIAXone IVPB Commonly known as: ROCEPHIN Inject 2 g into the vein daily. Indication: Strep infantarius TAVR-associated endocarditis First Dose: Yes Last Day of Therapy: 01/21/2023 Labs - Once weekly:  CBC/D and BMP, Labs - Once weekly: ESR and CRP Method of administration: IV Push Method of administration may be changed at the discretion of home infusion pharmacist based upon assessment of the patient and/or caregiver's ability to self-administer the medication ordered.   cyclobenzaprine 5 MG tablet Commonly known  as: FLEXERIL Take 5 mg by mouth 3 (three) times daily as needed.   EPINEPHrine 0.3 mg/0.3 mL Soaj injection Commonly known as: EPI-PEN Inject 0.3 mg into the muscle once as needed for anaphylaxis.   ferrous sulfate 325 (65 FE) MG tablet Take 325 mg by mouth daily.   fluticasone-salmeterol 250-50 MCG/ACT Aepb Commonly known as: ADVAIR Inhale 1 puff into the lungs in the morning and at bedtime. Rinse mouth well with water after each use   levothyroxine 25 MCG tablet Commonly known as: SYNTHROID Take 1 tablet (25 mcg total) by mouth daily at 6 (six) AM. Start taking on: January 09, 2023   lidocaine 5 % Commonly known as: LIDODERM Place 1 patch onto the skin daily. Remove & Discard patch within 12 hours or as directed by MD   losartan 25 MG tablet Commonly known as: COZAAR Take 1 tablet (25 mg total) by mouth daily.   MILK OF MAGNESIA PO Take 1 Dose by mouth as needed (constipation).   morphine 15 MG tablet Commonly known as: MSIR Take 1 tablet (15 mg total) by mouth every 4 (four) hours as needed for severe pain.   multivitamin with minerals Tabs tablet Take 1 tablet by mouth at bedtime.   nitroGLYCERIN 0.4 MG SL tablet Commonly known as: NITROSTAT Place 1 tablet (0.4 mg total) under the tongue  every 5 (five) minutes as needed for chest pain.   polyethylene glycol powder 17 GM/SCOOP powder Commonly known as: GLYCOLAX/MIRALAX Take 1 Container by mouth 2 (two) times daily.   promethazine 25 MG tablet Commonly known as: PHENERGAN Take 25 mg by mouth 4 (four) times daily as needed for nausea or vomiting.   rosuvastatin 40 MG tablet Commonly known as: CRESTOR Take 40 mg by mouth daily.   sertraline 100 MG tablet Commonly known as: ZOLOFT Take 150 mg by mouth daily.   Vitamin D-3 125 MCG (5000 UT) Tabs Take 5,000 Units by mouth daily.        Allergies  Allergen Reactions   Bee Venom Anaphylaxis   Bystolic [Nebivolol Hcl] Other (See Comments)    Nightmares,  flashbacks (PTSD)   Codeine Shortness Of Breath and Rash   Doxycycline Shortness Of Breath and Rash   Iodinated Contrast Media Shortness Of Breath and Anaphylaxis    Lost consciousness/difficulty breathing Omni - Paque Contrast    Iohexol Hives, Shortness Of Breath and Rash    Neck and Torso erythemia IVP-DYE >> SHORTNESS OF BREATH    Levaquin [Levofloxacin] Hives and Shortness Of Breath   Oxycodone Hcl Shortness Of Breath, Swelling and Rash   Stadol [Butorphanol] Shortness Of Breath   Succinylcholine Chloride Shortness Of Breath, Nausea Only, Rash and Other (See Comments)    Difficulty breathing    Tamiflu [Oseltamivir Phosphate]     UNSPECIFIED REACTION    Atorvastatin     myalgias   Synvisc [Hylan G-F 20] Hives and Rash   Gemfibrozil     Other reaction(s): Serum cholesterol raised   Latex Other (See Comments)    blister   Lisinopril     Other reaction(s): Cough   Omeprazole    Dilaudid [Hydromorphone Hcl] Nausea Only and Rash    Aggressive    Methocarbamol Nausea Only and Rash   Niacin Rash   Pentazocine Lactate Rash    rash    Consultations: None   Procedures/Studies: [place] EKG SITE RITE  Result Date: 12/16/2022 If Site Rite image not attached, placement could not be confirmed due to current cardiac rhythm.  ECHO TEE  Result Date: 12/14/2022    TRANSESOPHOGEAL ECHO REPORT   Patient Name:   [name] Date of Exam: 12/14/2022 Medical Rec #:  [id number]       Height:       73.0 in Accession #:    [id number]      Weight:       179.9 lb Date of Birth:  30-Dec-1950       BSA:          2.057 m Patient Age:    72 years        BP:           159/93 mmHg Patient Gender: M               HR:           59 bpm. Exam Location:  Inpatient Procedure: Transesophageal Echo, Color Doppler and Cardiac Doppler Indications:     Bacteremia  History:         Patient has prior history of Echocardiogram examinations, most                  recent 12/12/2022. COPD; Risk Factors:Hypertension,  Diabetes and                  Dyslipidemia. 29mm Edwards Sapien TAVR Implanted 08/08/19.  Aortic Valve: 29 mm Sapien prosthetic, stented (TAVR) valve is                  present in the aortic position. Procedure Date: 2021.  Sonographer:     Harriette Bouillon RDCS Referring Phys:  [id number] Corrin Parker Diagnosing Phys: Zoila Shutter MD PROCEDURE: After discussion of the risks and benefits of a TEE, an informed consent was obtained from the patient. The transesophogeal probe was passed without difficulty through the esophogus of the patient. Sedation performed by different physician. The patient was monitored while under deep sedation. Anesthestetic sedation was provided intravenously by Anesthesiology: 195mg  of Propofol, 100mg  of Lidocaine. Supplementary images were obtained from transthoracic windows as indicated to answer the clinical question. The patient developed no complications during the procedure.  IMPRESSIONS  1. Left ventricular ejection fraction, by estimation, is 55 to 60%. The left ventricle has normal function. There is mild left ventricular hypertrophy.  2. Right ventricular systolic function is normal. The right ventricular size is normal.  3. No left atrial/left atrial appendage thrombus was detected.  4. The mitral valve is degenerative. Mild mitral valve regurgitation.  5. Mobile filmentous mass in the LVOT adherant to the valve, likely vegetation. The aortic valve has been repaired/replaced. There is moderate thickening of the aortic valve. Aortic valve regurgitation is not visualized. Mild to moderate aortic valve stenosis. There is a 29 mm Sapien prosthetic (TAVR) valve present in the aortic position. Procedure Date: 2021. 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 Aortic valve area, by VTI measures 1.42 cm. Aortic valve mean gradient  measures 15.5 mmHg. Aortic valve Vmax measures 2.50 m/s.  6. There is mild (Grade II) layered plaque involving the  ascending aorta and aortic arch. 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 Consider structural heart team evaluation. Suspect treatment will be prolonged course of antibiotic therapy and repeat imaging of the valve after. FINDINGS  Left Ventricle: Left ventricular ejection fraction, by estimation, is 55 to 60%. The left ventricle has normal function. The left ventricular internal cavity size was normal in size. There is mild left ventricular hypertrophy. Right Ventricle: The right ventricular size is normal. No increase in right ventricular wall thickness. Right ventricular systolic function is normal. Left Atrium: Left atrial size was normal in size. No left atrial/left atrial appendage thrombus was detected. Right Atrium: Right atrial size was normal in size. Pericardium: There is no evidence of pericardial effusion. Mitral Valve: The mitral valve is degenerative in appearance. There is moderate thickening of the mitral valve leaflet(s). There is moderate calcification of the posterior mitral valve leaflet(s). Mild to moderate mitral annular calcification. Mild mitral valve regurgitation. Tricuspid Valve: The tricuspid valve is grossly normal. Tricuspid valve regurgitation is trivial. Aortic Valve: Mobile filmentous mass in the LVOT adherant to the valve, likely vegetation. The aortic valve has been repaired/replaced. There is moderate thickening of the aortic valve. Aortic valve regurgitation is not visualized. Mild to moderate aortic stenosis is present. Aortic valve mean gradient measures 15.5 mmHg. Aortic valve peak gradient measures 25.1 mmHg. Aortic valve area, by VTI measures 1.42 cm. There is a 29 mm Sapien prosthetic, stented (TAVR) valve present in the aortic position. Procedure Date: 2021. Pulmonic Valve: The pulmonic valve was not well visualized. Pulmonic valve regurgitation is not visualized. Aorta: The aortic root and ascending  aorta are structurally normal, with no evidence of dilitation. There is mild (Grade II) layered plaque involving the ascending aotra and  aortic arch. Venous: The left lower pulmonary vein and left upper pulmonary vein are normal. IAS/Shunts: No atrial level shunt detected by color flow Doppler. Additional Comments: Spectral Doppler performed. LEFT VENTRICLE PLAX 2D LVOT diam:     2.20 cm LV SV:         72 LV SV Index:   35 LVOT Area:     3.80 cm  AORTIC VALVE AV Area (Vmax):    1.91 cm AV Area (Vmean):   1.77 cm AV Area (VTI):     1.42 cm AV Vmax:           250.50 cm/s AV Vmean:          185.000 cm/s AV VTI:            0.508 m AV Peak Grad:      25.1 mmHg AV Mean Grad:      15.5 mmHg LVOT Vmax:         126.00 cm/s LVOT Vmean:        86.300 cm/s LVOT VTI:          0.190 m LVOT/AV VTI ratio: 0.37  AORTA Ao Asc diam: 4.00 cm  SHUNTS Systemic VTI:  0.19 m Systemic Diam: 2.20 cm Zoila Shutter MD Electronically signed by Zoila Shutter MD Signature Date/Time: 12/14/2022/2:41:36 PM    Final    EP STUDY  Result Date: 12/14/2022 See surgical note for result.  ECHOCARDIOGRAM LIMITED  Result Date: 12/12/2022    ECHOCARDIOGRAM LIMITED REPORT   Patient Name:   [name] Date of Exam: 12/12/2022 Medical Rec #:  [id number]       Height:       73.0 in Accession #:    [id number]      Weight:       179.9 lb Date of Birth:  08-06-1950       BSA:          2.057 m Patient Age:    72 years        BP:           134/71 mmHg Patient Gender: M               HR:           57 bpm. Exam Location:  Inpatient Procedure: 2D Echo, Color Doppler and Cardiac Doppler Indications:    Bacteremia  History:        Patient has prior history of Echocardiogram examinations, most                 recent 11/19/2022. COPD, Arrythmias:Atrial Fibrillation,                 Signs/Symptoms:Fever; Risk Factors:Hypertension, Dyslipidemia                 and Diabetes.                 Aortic Valve: 29 mm Edwards Sapien prosthetic, stented (TAVR)                  valve is present in the aortic position. Procedure Date: 08/08/19.  Sonographer:    Delcie Roch RDCS Referring Phys: [phone] JACOB J STINSON IMPRESSIONS  1. Limited Echo to evaluate for infective endocarditis.  2. Left ventricular ejection fraction, by estimation, is 60 to 65%. The left ventricle has normal function. The left ventricle has no regional wall motion abnormalities. There is mild left ventricular hypertrophy. Left ventricular diastolic parameters are consistent with  Grade I diastolic dysfunction (impaired relaxation).  3. Right ventricular systolic function is normal. The right ventricular size is normal. There is normal pulmonary artery systolic pressure.  4. The mitral valve is abnormal. Mild mitral valve regurgitation. No evidence of mitral stenosis. Severe mitral annular calcification. There is a mobile pedunculated echodensity measuring 0.2 x 0.3 cm attached to the ventricular surface of anterior mitral valve leaflet which is noted to be protruding into the LVOT. In some views, it appears to be attached to the aortic prosthesis, although, with no evidence of aortic valvular dysfunction. Strongly consider TEE for further evaluation.  5. The aortic valve was not well visualized. Aortic valve regurgitation is not visualized. No aortic stenosis is present. There is a 29 mm Edwards Sapien prosthetic (TAVR) valve present in the aortic position. Procedure Date: 08/08/19. Aortic valve mean gradient measures 16.0 mmHg.  6. The inferior vena cava is normal in size with greater than 50% respiratory variability, suggesting right atrial pressure of 3 mmHg. Comparison(s): Changes from prior study are noted. Mobile echodensity noted, see above. FINDINGS  Left Ventricle: Left ventricular ejection fraction, by estimation, is 60 to 65%. The left ventricle has normal function. The left ventricle has no regional wall motion abnormalities. The left ventricular internal cavity size was normal in size. There is  mild  left ventricular hypertrophy. Left ventricular diastolic parameters are consistent with Grade I diastolic dysfunction (impaired relaxation). Right Ventricle: The right ventricular size is normal. No increase in right ventricular wall thickness. Right ventricular systolic function is normal. There is normal pulmonary artery systolic pressure. The tricuspid regurgitant velocity is 2.35 m/s, and  with an assumed right atrial pressure of 3 mmHg, the estimated right ventricular systolic pressure is 25.1 mmHg. Left Atrium: Left atrial size was not assessed. Right Atrium: Right atrial size was not assessed. Pericardium: There is no evidence of pericardial effusion. Mitral Valve: The mitral valve is abnormal. Severe mitral annular calcification. Mild mitral valve regurgitation. No evidence of mitral valve stenosis. Tricuspid Valve: The tricuspid valve is normal in structure. Tricuspid valve regurgitation is not demonstrated. No evidence of tricuspid stenosis. Aortic Valve: The aortic valve was not well visualized. Aortic valve regurgitation is not visualized. No aortic stenosis is present. Aortic valve mean gradient measures 16.0 mmHg. Aortic valve peak gradient measures 30.5 mmHg. Aortic valve area, by VTI measures 1.43 cm. There is a 29 mm Edwards Sapien prosthetic, stented (TAVR) valve present in the aortic position. Procedure Date: 08/08/19. Pulmonic Valve: The pulmonic valve was not well visualized. Pulmonic valve regurgitation is not visualized. No evidence of pulmonic stenosis. Aorta: Aortic root could not be assessed. Venous: The inferior vena cava is normal in size with greater than 50% respiratory variability, suggesting right atrial pressure of 3 mmHg. IAS/Shunts: No atrial level shunt detected by color flow Doppler. Additional Comments: Spectral Doppler performed. Color Doppler performed.  LEFT VENTRICLE PLAX 2D LVIDd:         5.10 cm LVIDs:         3.20 cm LV PW:         1.30 cm LV IVS:        1.20 cm LVOT diam:      2.10 cm LV SV:         86 LV SV Index:   42 LVOT Area:     3.46 cm  IVC IVC diam: 1.60 cm LEFT ATRIUM         Index LA diam:    3.60 cm 1.75  cm/m  AORTIC VALVE AV Area (Vmax):    1.33 cm AV Area (Vmean):   1.29 cm AV Area (VTI):     1.43 cm AV Vmax:           276.00 cm/s AV Vmean:          183.000 cm/s AV VTI:            0.603 m AV Peak Grad:      30.5 mmHg AV Mean Grad:      16.0 mmHg LVOT Vmax:         106.00 cm/s LVOT Vmean:        68.350 cm/s LVOT VTI:          0.250 m LVOT/AV VTI ratio: 0.41 MITRAL VALVE                TRICUSPID VALVE MV Area (PHT): 2.54 cm     TR Peak grad:   22.1 mmHg MV Decel Time: 299 msec     TR Vmax:        235.00 cm/s MV E velocity: 114.00 cm/s MV A velocity: 174.00 cm/s  SHUNTS MV E/A ratio:  0.66         Systemic VTI:  0.25 m                             Systemic Diam: 2.10 cm Vishnu Priya Mallipeddi Electronically signed by Winfield Rast Mallipeddi Signature Date/Time: 12/12/2022/4:20:23 PM    Final    DG Chest 2 View  Result Date: 12/11/2022 CLINICAL DATA:  Cough. EXAM: CHEST - 2 VIEW COMPARISON:  December 09, 2022. FINDINGS: The heart size and mediastinal contours are within normal limits. Status post transcatheter aortic valve repair. Both lungs are clear. The visualized skeletal structures are unremarkable. IMPRESSION: No active cardiopulmonary disease. Electronically Signed   By: Lupita Raider M.D.   On: 12/11/2022 16:15   CT CHEST ABDOMEN PELVIS WO CONTRAST  Result Date: 12/09/2022 CLINICAL DATA:  Pneumonia, complication suspected, xray done.  Fall. EXAM: CT CHEST, ABDOMEN AND PELVIS WITHOUT CONTRAST TECHNIQUE: Multidetector CT imaging of the chest, abdomen and pelvis was performed following the standard protocol without IV contrast. RADIATION DOSE REDUCTION: This exam was performed according to the departmental dose-optimization program which includes automated exposure control, adjustment of the mA and/or kV according to patient size and/or use of iterative  reconstruction technique. COMPARISON:  09/20/2020 FINDINGS: CT CHEST FINDINGS Cardiovascular: Heart is normal size. Aorta is normal caliber. Prior TAVR coronary artery and aortic atherosclerosis. Mediastinum/Nodes: No mediastinal, hilar, or axillary adenopathy. Trachea and esophagus are unremarkable. Thyroid unremarkable. Lungs/Pleura: Lungs are clear. No focal airspace opacities or suspicious nodules. No effusions. Musculoskeletal: Chest wall soft tissues are unremarkable. No acute bony abnormality. CT ABDOMEN PELVIS FINDINGS Hepatobiliary: No focal liver abnormality is seen. Status post cholecystectomy. No biliary dilatation. Pancreas: No focal abnormality or ductal dilatation. Spleen: No focal abnormality.  Normal size. Adrenals/Urinary Tract: No adrenal abnormality. No focal renal abnormality. No stones or hydronephrosis. Urinary bladder is unremarkable. Stomach/Bowel: Large stool burden throughout the colon suggesting constipation. Gaseous distention of the colon may reflect ileus. Stomach and small bowel decompressed, unremarkable. Vascular/Lymphatic: Aortic atherosclerosis. No evidence of aneurysm or adenopathy. Reproductive: Insert male pelvis Other: No free fluid or free air. Musculoskeletal: Mild compression fracture through the superior endplate of L1. This is new since 2022. IMPRESSION: Large stool burden throughout the colon compatible with constipation. Diffuse gaseous distention  of the colon may reflect ileus. No acute cardiopulmonary disease. Mild compression fracture through the superior endplate of L1, age indeterminate but new since 2022. Coronary artery disease, aortic atherosclerosis. Electronically Signed   By: Charlett Nose M.D.   On: 12/09/2022 23:10   CT Head Wo Contrast  Result Date: 12/09/2022 CLINICAL DATA:  Fall hitting head on floor. Right forehead hematoma. EXAM: CT HEAD WITHOUT CONTRAST TECHNIQUE: Contiguous axial images were obtained from the base of the skull through the vertex  without intravenous contrast. RADIATION DOSE REDUCTION: This exam was performed according to the departmental dose-optimization program which includes automated exposure control, adjustment of the mA and/or kV according to patient size and/or use of iterative reconstruction technique. COMPARISON:  Head CT 02/07/2022 FINDINGS: Brain: No intracranial hemorrhage, mass effect, or midline shift. Generalized atrophy with mild progression from prior. No hydrocephalus. The basilar cisterns are patent. Minimal periventricular chronic small vessel ischemic change. Encephalomalacia in the posteroinferior left frontal lobe is new from prior exam, but likely represents interval ischemia. No evidence of territorial infarct or acute ischemia. No extra-axial or intracranial fluid collection. Vascular: Atherosclerosis of skullbase vasculature without hyperdense vessel or abnormal calcification. Skull: No fracture or focal lesion. Sinuses/Orbits: Mucosal thickening throughout the ethmoid air cells, chronic. No acute findings. Other: Right frontal scalp hematoma. IMPRESSION: 1. Right frontal scalp hematoma. No acute intracranial abnormality. No skull fracture. 2. Generalized atrophy and chronic small vessel ischemic change. Encephalomalacia in the posteroinferior left frontal lobe is new from September 2023 exam, likely represents interval ischemia. Electronically Signed   By: Narda Rutherford M.D.   On: 12/09/2022 22:21   DG Chest 2 View  Result Date: 12/09/2022 CLINICAL DATA:  Suspected sepsis. EXAM: CHEST - 2 VIEW COMPARISON:  Chest radiograph dated 02/07/2022. FINDINGS: No focal consolidation, pleural effusion, or pneumothorax. Top-normal cardiac size. Aortic valve repair. Atherosclerotic calcification of the aorta. No acute osseous pathology. IMPRESSION: No active cardiopulmonary disease. Electronically Signed   By: Elgie Collard M.D.   On: 12/09/2022 21:39      Subjective: Patient seen and examined at bedside.   Awake, poor historian, slow to respond.  No fever, seizures, vomiting reported.  Discharge Exam: Vitals:   01/08/23 0425 01/08/23 0727  BP: 139/67 (!) 102/54  Pulse: (!) 59 (!) 56  Resp: 18 15  Temp: 97.7 F (36.5 C) (!) 97.5 F (36.4 C)  SpO2: 100% 100%    General: Pt is awake, slow to respond, poor historian.  Looks chronically ill and deconditioned.  On room air.  Flat affect. Cardiovascular: Mild intermittent bradycardia present, S1/S2 + Respiratory: bilateral decreased breath sounds at bases with some scattered crackles Abdominal: Soft, NT, mildly distended, bowel sounds + Extremities: Trace lower extremity edema; no cyanosis    The results of significant diagnostics from this hospitalization (including imaging, microbiology, ancillary and laboratory) are listed below for reference.     Microbiology: No results found for this or any previous visit (from the past 240 hour(s)).   Labs: BNP (last 3 results) Recent Labs    12/09/22 2125  BNP 290.0*   Basic Metabolic Panel: Recent Labs  Lab 01/02/23 0442 01/04/23 1439 01/06/23 0505  NA 131* 132* 131*  K 5.0 4.3 4.4  CL 101 99 98  CO2 23 25 23   GLUCOSE 99 133* 102*  BUN 29* 27* 20  CREATININE 1.00 1.02 0.78  CALCIUM 9.0 8.6* 9.0  MG  --   --  2.1  PHOS 3.3 3.2  --  Liver Function Tests: Recent Labs  Lab 01/02/23 0442 01/04/23 1439  ALBUMIN 2.6* 2.6*   No results for input(s): "LIPASE", "AMYLASE" in the last 168 hours. No results for input(s): "AMMONIA" in the last 168 hours. CBC: No results for input(s): "WBC", "NEUTROABS", "HGB", "HCT", "MCV", "PLT" in the last 168 hours. Cardiac Enzymes: No results for input(s): "CKTOTAL", "CKMB", "CKMBINDEX", "TROPONINI" in the last 168 hours. BNP: Invalid input(s): "POCBNP" CBG: No results for input(s): "GLUCAP" in the last 168 hours. D-Dimer No results for input(s): "DDIMER" in the last 72 hours. Hgb A1c No results for input(s): "HGBA1C" in the last 72  hours. Lipid Profile No results for input(s): "CHOL", "HDL", "LDLCALC", "TRIG", "CHOLHDL", "LDLDIRECT" in the last 72 hours. Thyroid function studies No results for input(s): "TSH", "T4TOTAL", "T3FREE", "THYROIDAB" in the last 72 hours.  Invalid input(s): "FREET3" Anemia work up No results for input(s): "VITAMINB12", "FOLATE", "FERRITIN", "TIBC", "IRON", "RETICCTPCT" in the last 72 hours. Urinalysis    Component Value Date/Time   COLORURINE YELLOW 01/01/2023 1601   APPEARANCEUR CLEAR 01/01/2023 1601   LABSPEC 1.014 01/01/2023 1601   PHURINE 5.0 01/01/2023 1601   GLUCOSEU NEGATIVE 01/01/2023 1601   GLUCOSEU NEGATIVE 02/02/2007 0936   HGBUR NEGATIVE 01/01/2023 1601   BILIRUBINUR NEGATIVE 01/01/2023 1601   BILIRUBINUR negative 12/10/2015 1503   KETONESUR NEGATIVE 01/01/2023 1601   PROTEINUR NEGATIVE 01/01/2023 1601   UROBILINOGEN 2.0 12/10/2015 1503   UROBILINOGEN 1.0 01/16/2014 1146   NITRITE NEGATIVE 01/01/2023 1601   LEUKOCYTESUR NEGATIVE 01/01/2023 1601   Sepsis Labs No results for input(s): "WBC" in the last 168 hours.  Invalid input(s): "PROCALCITONIN", "LACTICIDVEN" Microbiology No results found for this or any previous visit (from the past 240 hour(s)).   Time coordinating discharge: 35 minutes  SIGNED:   Glade Lloyd, MD  [facility] 01/08/2023, 8:40 AM

## 2023-01-08 NOTE — Progress Notes (Signed)
Physical Therapy Treatment Patient Details Name: [name] MRN: [id number] DOB: 1950/10/17 Today's Date: 01/08/2023   History of Present Illness Pt is a 72 y.o. M who presents 12/29/2022 with inability to take care of themselves at home. Pt just discharged with a PICC line for IV antibiotics for endocarditis. Significant PMH: COPD, mild dementia, CVA, idiopathic peripheral autonomic neuropathy ([place]), PAF, PTSD, TAVR, seizure.    PT Comments  Pt greeted supine in bed and agreeable to session with encouragement as pt verbalizing some frustration with D/C, spoke with MD post session and MD aware. PT reporting RLE cramping at start of session with pot able to complete pre-gait LE exercises supine with cues for technique with good return. Pt demonstrating slightly improved gait tolerance this session ambulating 30' with RW support and grossly CGA for safety with no knee buckling or LOB noted. Pt continues to need bile UE to slowly lower to sitting at end of session with pt demonstrating poor eccentric control this session despite cues. Continued review of fall prevention strategies and importance of continue RW for use for safety with pt verbalizing understanding. Pt continues to be limited by impaired cognition, poor balance/postural reactions and decreased activity tolerance. Pt continues to benefit from skilled PT services to progress toward functional mobility goals.      If plan is discharge home, recommend the following: A little help with walking and/or transfers;A little help with bathing/dressing/bathroom;Assistance with cooking/housework;Assist for transportation;Help with stairs or ramp for entrance   Can travel by private vehicle     Yes  Equipment Recommendations  None recommended by PT    Recommendations for Other Services       Precautions / Restrictions Precautions Precautions: Fall Precaution Comments: falls have all been backwards; sometimes dizzy, sometimes just  off-balance Restrictions Weight Bearing Restrictions: No     Mobility  Bed Mobility Overal bed mobility: Needs Assistance Bed Mobility: Supine to Sit     Supine to sit: Min assist     General bed mobility comments: increased time and cues for bed rail use and min assist to eleavte trunk    Transfers Overall transfer level: Needs assistance Equipment used: Rolling walker (2 wheels) Transfers: Sit to/from Stand, Bed to chair/wheelchair/BSC Sit to Stand: Min assist           General transfer comment: cues for hand placement and min assist to power up    Ambulation/Gait Ambulation/Gait assistance: Contact guard assist Gait Distance (Feet): 30 Feet Assistive device: Rolling walker (2 wheels) Gait Pattern/deviations: Step-through pattern, Decreased step length - right, Decreased stride length, Decreased dorsiflexion - right, Shuffle Gait velocity: decr     General Gait Details: contact guard with intermittent light assist to maintain balance   Stairs             Wheelchair Mobility     Tilt Bed    Modified Rankin (Stroke Patients Only)       Balance Overall balance assessment: Needs assistance Sitting-balance support: Feet supported, No upper extremity supported Sitting balance-Leahy Scale: Good Sitting balance - Comments: seated on EOB   Standing balance support: Reliant on assistive device for balance, Bilateral upper extremity supported, Single extremity supported Standing balance-Leahy Scale: Poor Standing balance comment: able to perform reaching tasks while standing to simulate standing ADLs with limited standing tolerance and CGA for balance  Cognition Arousal: Alert Behavior During Therapy: WFL for tasks assessed/performed Overall Cognitive Status: History of cognitive impairments - at baseline                                 General Comments: able to follow directions, limited by pain,  some aggitation towards MD and being discharged with pt demonstrating confusion about situtation stating "but what about the stoke I had when i got here", spoke with MAD after session and MD aware        Exercises Other Exercises Other Exercises: pre-gait LE warm-up exercises supine: heel slides x10 ea side, APs x10 ea side, QS x10 ea side    General Comments        Pertinent Vitals/Pain Pain Assessment Pain Assessment: Faces Faces Pain Scale: Hurts little more Pain Location: RLE Pain Descriptors / Indicators: Cramping, Sore Pain Intervention(s): Monitored during session, Limited activity within patient's tolerance, Repositioned    Home Living                          Prior Function            PT Goals (current goals can now be found in the care plan section) Acute Rehab PT Goals Patient Stated Goal: agreeable to rehab PT Goal Formulation: With patient/family Time For Goal Achievement: 01/12/23 Progress towards PT goals: Progressing toward goals    Frequency    Min 1X/week      PT Plan Current plan remains appropriate    Co-evaluation              AM-PAC PT "6 Clicks" Mobility   Outcome Measure  Help needed turning from your back to your side while in a flat bed without using bedrails?: A Little Help needed moving from lying on your back to sitting on the side of a flat bed without using bedrails?: A Little Help needed moving to and from a bed to a chair (including a wheelchair)?: A Little Help needed standing up from a chair using your arms (e.g., wheelchair or bedside chair)?: A Little Help needed to walk in hospital room?: A Little Help needed climbing 3-5 steps with a railing? : Total 6 Click Score: 16    End of Session Equipment Utilized During Treatment: Gait belt Activity Tolerance: Patient tolerated treatment well Patient left: in chair;with call bell/phone within reach;with chair alarm set Nurse Communication: Mobility status PT  Visit Diagnosis: Repeated falls (R29.6);Other abnormalities of gait and mobility (R26.89);History of falling (Z91.81);Unsteadiness on feet (R26.81)     Time: 4098-1191 PT Time Calculation (min) (ACUTE ONLY): 18 min  Charges:    $Gait Training: 8-22 mins PT General Charges $$ ACUTE PT VISIT: 1 Visit                     Guilherme Schwenke R. PTA Acute Rehabilitation Services Office: [phone]   Catalina Antigua 01/08/2023, 10:18 AM

## 2023-01-12 ENCOUNTER — Inpatient Hospital Stay: Payer: Medicare HMO | Admitting: Internal Medicine

## 2023-01-21 ENCOUNTER — Encounter: Payer: Self-pay | Admitting: Internal Medicine

## 2023-02-04 NOTE — Progress Notes (Deleted)
No show

## 2023-02-05 ENCOUNTER — Ambulatory Visit (HOSPITAL_COMMUNITY): Payer: Medicare HMO

## 2023-02-05 ENCOUNTER — Ambulatory Visit (HOSPITAL_COMMUNITY): Payer: Medicare HMO | Attending: Family Medicine

## 2023-02-09 ENCOUNTER — Inpatient Hospital Stay: Payer: Medicare HMO | Admitting: Internal Medicine

## 2023-02-13 ENCOUNTER — Inpatient Hospital Stay (HOSPITAL_COMMUNITY)
Admission: EM | Admit: 2023-02-13 | Discharge: 2023-02-19 | DRG: 689 | Disposition: A | Discharge status: Home Health | Payer: Medicare HMO | Attending: Internal Medicine | Admitting: Internal Medicine

## 2023-02-13 ENCOUNTER — Emergency Department (HOSPITAL_COMMUNITY): Payer: Medicare HMO

## 2023-02-13 ENCOUNTER — Other Ambulatory Visit: Payer: Self-pay

## 2023-02-13 DIAGNOSIS — R5381 Other malaise: Secondary | ICD-10-CM | POA: Diagnosis not present

## 2023-02-13 DIAGNOSIS — I110 Hypertensive heart disease with heart failure: Secondary | ICD-10-CM | POA: Diagnosis present

## 2023-02-13 DIAGNOSIS — Z8616 Personal history of COVID-19: Secondary | ICD-10-CM | POA: Diagnosis not present

## 2023-02-13 DIAGNOSIS — Z23 Encounter for immunization: Secondary | ICD-10-CM

## 2023-02-13 DIAGNOSIS — Z6822 Body mass index (BMI) 22.0-22.9, adult: Secondary | ICD-10-CM

## 2023-02-13 DIAGNOSIS — K5900 Constipation, unspecified: Secondary | ICD-10-CM | POA: Diagnosis present

## 2023-02-13 DIAGNOSIS — F03A0 Unspecified dementia, mild, without behavioral disturbance, psychotic disturbance, mood disturbance, and anxiety: Secondary | ICD-10-CM | POA: Diagnosis present

## 2023-02-13 DIAGNOSIS — E1140 Type 2 diabetes mellitus with diabetic neuropathy, unspecified: Secondary | ICD-10-CM | POA: Diagnosis present

## 2023-02-13 DIAGNOSIS — E871 Hypo-osmolality and hyponatremia: Secondary | ICD-10-CM | POA: Diagnosis present

## 2023-02-13 DIAGNOSIS — Z87891 Personal history of nicotine dependence: Secondary | ICD-10-CM

## 2023-02-13 DIAGNOSIS — F4310 Post-traumatic stress disorder, unspecified: Secondary | ICD-10-CM | POA: Diagnosis present

## 2023-02-13 DIAGNOSIS — Z1152 Encounter for screening for COVID-19: Secondary | ICD-10-CM | POA: Diagnosis not present

## 2023-02-13 DIAGNOSIS — Z86718 Personal history of other venous thrombosis and embolism: Secondary | ICD-10-CM

## 2023-02-13 DIAGNOSIS — E039 Hypothyroidism, unspecified: Secondary | ICD-10-CM | POA: Diagnosis present

## 2023-02-13 DIAGNOSIS — I10 Essential (primary) hypertension: Secondary | ICD-10-CM | POA: Diagnosis present

## 2023-02-13 DIAGNOSIS — Z91030 Bee allergy status: Secondary | ICD-10-CM

## 2023-02-13 DIAGNOSIS — Z1612 Extended spectrum beta lactamase (ESBL) resistance: Secondary | ICD-10-CM | POA: Diagnosis present

## 2023-02-13 DIAGNOSIS — Z7901 Long term (current) use of anticoagulants: Secondary | ICD-10-CM

## 2023-02-13 DIAGNOSIS — R4182 Altered mental status, unspecified: Principal | ICD-10-CM

## 2023-02-13 DIAGNOSIS — E782 Mixed hyperlipidemia: Secondary | ICD-10-CM | POA: Diagnosis present

## 2023-02-13 DIAGNOSIS — K5903 Drug induced constipation: Secondary | ICD-10-CM | POA: Diagnosis present

## 2023-02-13 DIAGNOSIS — E860 Dehydration: Secondary | ICD-10-CM | POA: Diagnosis present

## 2023-02-13 DIAGNOSIS — J4489 Other specified chronic obstructive pulmonary disease: Secondary | ICD-10-CM | POA: Diagnosis present

## 2023-02-13 DIAGNOSIS — R6510 Systemic inflammatory response syndrome (SIRS) of non-infectious origin without acute organ dysfunction: Secondary | ICD-10-CM | POA: Diagnosis not present

## 2023-02-13 DIAGNOSIS — Z807 Family history of other malignant neoplasms of lymphoid, hematopoietic and related tissues: Secondary | ICD-10-CM

## 2023-02-13 DIAGNOSIS — Z91040 Latex allergy status: Secondary | ICD-10-CM

## 2023-02-13 DIAGNOSIS — E876 Hypokalemia: Secondary | ICD-10-CM | POA: Diagnosis present

## 2023-02-13 DIAGNOSIS — Z885 Allergy status to narcotic agent status: Secondary | ICD-10-CM

## 2023-02-13 DIAGNOSIS — R531 Weakness: Secondary | ICD-10-CM | POA: Diagnosis not present

## 2023-02-13 DIAGNOSIS — D509 Iron deficiency anemia, unspecified: Secondary | ICD-10-CM | POA: Diagnosis present

## 2023-02-13 DIAGNOSIS — Z8249 Family history of ischemic heart disease and other diseases of the circulatory system: Secondary | ICD-10-CM | POA: Diagnosis not present

## 2023-02-13 DIAGNOSIS — Z91041 Radiographic dye allergy status: Secondary | ICD-10-CM

## 2023-02-13 DIAGNOSIS — G9341 Metabolic encephalopathy: Secondary | ICD-10-CM | POA: Diagnosis present

## 2023-02-13 DIAGNOSIS — Z79890 Hormone replacement therapy (postmenopausal): Secondary | ICD-10-CM

## 2023-02-13 DIAGNOSIS — H9190 Unspecified hearing loss, unspecified ear: Secondary | ICD-10-CM | POA: Diagnosis present

## 2023-02-13 DIAGNOSIS — Z8673 Personal history of transient ischemic attack (TIA), and cerebral infarction without residual deficits: Secondary | ICD-10-CM

## 2023-02-13 DIAGNOSIS — I4819 Other persistent atrial fibrillation: Secondary | ICD-10-CM | POA: Diagnosis present

## 2023-02-13 DIAGNOSIS — T402X5A Adverse effect of other opioids, initial encounter: Secondary | ICD-10-CM | POA: Diagnosis present

## 2023-02-13 DIAGNOSIS — B9620 Unspecified Escherichia coli [E. coli] as the cause of diseases classified elsewhere: Secondary | ICD-10-CM | POA: Diagnosis present

## 2023-02-13 DIAGNOSIS — F1120 Opioid dependence, uncomplicated: Secondary | ICD-10-CM | POA: Diagnosis present

## 2023-02-13 DIAGNOSIS — I509 Heart failure, unspecified: Secondary | ICD-10-CM | POA: Diagnosis not present

## 2023-02-13 DIAGNOSIS — A419 Sepsis, unspecified organism: Secondary | ICD-10-CM | POA: Diagnosis not present

## 2023-02-13 DIAGNOSIS — E46 Unspecified protein-calorie malnutrition: Secondary | ICD-10-CM | POA: Diagnosis present

## 2023-02-13 DIAGNOSIS — E8809 Other disorders of plasma-protein metabolism, not elsewhere classified: Secondary | ICD-10-CM | POA: Diagnosis present

## 2023-02-13 DIAGNOSIS — Z953 Presence of xenogenic heart valve: Secondary | ICD-10-CM | POA: Diagnosis not present

## 2023-02-13 DIAGNOSIS — R7989 Other specified abnormal findings of blood chemistry: Secondary | ICD-10-CM | POA: Diagnosis not present

## 2023-02-13 DIAGNOSIS — Z96653 Presence of artificial knee joint, bilateral: Secondary | ICD-10-CM | POA: Diagnosis present

## 2023-02-13 DIAGNOSIS — Z881 Allergy status to other antibiotic agents status: Secondary | ICD-10-CM

## 2023-02-13 DIAGNOSIS — Z79899 Other long term (current) drug therapy: Secondary | ICD-10-CM

## 2023-02-13 DIAGNOSIS — Z823 Family history of stroke: Secondary | ICD-10-CM

## 2023-02-13 DIAGNOSIS — M549 Dorsalgia, unspecified: Secondary | ICD-10-CM | POA: Diagnosis present

## 2023-02-13 DIAGNOSIS — G8929 Other chronic pain: Secondary | ICD-10-CM | POA: Diagnosis present

## 2023-02-13 DIAGNOSIS — Z8679 Personal history of other diseases of the circulatory system: Secondary | ICD-10-CM

## 2023-02-13 DIAGNOSIS — Z7951 Long term (current) use of inhaled steroids: Secondary | ICD-10-CM

## 2023-02-13 DIAGNOSIS — N3000 Acute cystitis without hematuria: Principal | ICD-10-CM | POA: Diagnosis present

## 2023-02-13 DIAGNOSIS — K5909 Other constipation: Secondary | ICD-10-CM | POA: Diagnosis not present

## 2023-02-13 DIAGNOSIS — E1169 Type 2 diabetes mellitus with other specified complication: Secondary | ICD-10-CM | POA: Diagnosis present

## 2023-02-13 DIAGNOSIS — F411 Generalized anxiety disorder: Secondary | ICD-10-CM | POA: Diagnosis present

## 2023-02-13 DIAGNOSIS — K219 Gastro-esophageal reflux disease without esophagitis: Secondary | ICD-10-CM | POA: Diagnosis present

## 2023-02-13 DIAGNOSIS — Z888 Allergy status to other drugs, medicaments and biological substances status: Secondary | ICD-10-CM

## 2023-02-13 LAB — CBC WITH DIFFERENTIAL/PLATELET
Abs Immature Granulocytes: 0 10*3/uL (ref 0.00–0.07)
Basophils Absolute: 0 10*3/uL (ref 0.0–0.1)
Basophils Relative: 0 %
Eosinophils Absolute: 0.3 10*3/uL (ref 0.0–0.5)
Eosinophils Relative: 1 %
HCT: 29.7 % — ABNORMAL LOW (ref 39.0–52.0)
Hemoglobin: 9.5 g/dL — ABNORMAL LOW (ref 13.0–17.0)
Lymphocytes Relative: 3 %
Lymphs Abs: 0.8 10*3/uL (ref 0.7–4.0)
MCH: 24.7 pg — ABNORMAL LOW (ref 26.0–34.0)
MCHC: 32 g/dL (ref 30.0–36.0)
MCV: 77.1 fL — ABNORMAL LOW (ref 80.0–100.0)
Monocytes Absolute: 0.5 10*3/uL (ref 0.1–1.0)
Monocytes Relative: 2 %
Neutro Abs: 25.1 10*3/uL — ABNORMAL HIGH (ref 1.7–7.7)
Neutrophils Relative %: 94 %
Platelets: 382 10*3/uL (ref 150–400)
RBC: 3.85 MIL/uL — ABNORMAL LOW (ref 4.22–5.81)
RDW: 16.8 % — ABNORMAL HIGH (ref 11.5–15.5)
WBC: 26.7 10*3/uL — ABNORMAL HIGH (ref 4.0–10.5)
nRBC: 0 % (ref 0.0–0.2)

## 2023-02-13 LAB — URINALYSIS, W/ REFLEX TO CULTURE (INFECTION SUSPECTED)
Bilirubin Urine: NEGATIVE
Glucose, UA: NEGATIVE mg/dL
Ketones, ur: 5 mg/dL — AB
Nitrite: POSITIVE — AB
Protein, ur: 30 mg/dL — AB
Specific Gravity, Urine: 1.012 (ref 1.005–1.030)
pH: 5 (ref 5.0–8.0)

## 2023-02-13 LAB — COMPREHENSIVE METABOLIC PANEL
ALT: 19 U/L (ref 0–44)
AST: 19 U/L (ref 15–41)
Albumin: 2.9 g/dL — ABNORMAL LOW (ref 3.5–5.0)
Alkaline Phosphatase: 88 U/L (ref 38–126)
Anion gap: 10 (ref 5–15)
BUN: 16 mg/dL (ref 8–23)
CO2: 21 mmol/L — ABNORMAL LOW (ref 22–32)
Calcium: 8.6 mg/dL — ABNORMAL LOW (ref 8.9–10.3)
Chloride: 97 mmol/L — ABNORMAL LOW (ref 98–111)
Creatinine, Ser: 0.83 mg/dL (ref 0.61–1.24)
GFR, Estimated: >60 mL/min (ref >60)
Glucose, Bld: 118 mg/dL — ABNORMAL HIGH (ref 70–99)
Potassium: 3.5 mmol/L (ref 3.5–5.1)
Sodium: 128 mmol/L — ABNORMAL LOW (ref 135–145)
Total Bilirubin: 0.5 mg/dL (ref 0.3–1.2)
Total Protein: 7.5 g/dL (ref 6.5–8.1)

## 2023-02-13 LAB — PROTIME-INR
INR: 1.1 (ref 0.8–1.2)
Prothrombin Time: 14.8 s (ref 11.4–15.2)

## 2023-02-13 LAB — AMMONIA: Ammonia: 12 umol/L (ref 9–35)

## 2023-02-13 LAB — TROPONIN I (HIGH SENSITIVITY)
Troponin I (High Sensitivity): 15 ng/L (ref <18)
Troponin I (High Sensitivity): 17 ng/L (ref <18)

## 2023-02-13 LAB — SODIUM, URINE, RANDOM: Sodium, Ur: 38 mmol/L

## 2023-02-13 LAB — LACTIC ACID, PLASMA
Lactic Acid, Venous: 0.7 mmol/L (ref 0.5–1.9)
Lactic Acid, Venous: 0.7 mmol/L (ref 0.5–1.9)

## 2023-02-13 LAB — BRAIN NATRIURETIC PEPTIDE: B Natriuretic Peptide: 377 pg/mL — ABNORMAL HIGH (ref 0.0–100.0)

## 2023-02-13 LAB — SARS CORONAVIRUS 2 BY RT PCR: SARS Coronavirus 2 by RT PCR: NEGATIVE

## 2023-02-13 LAB — APTT: aPTT: 32 s (ref 24–36)

## 2023-02-13 MED ORDER — ONDANSETRON HCL 4 MG PO TABS
4.0000 mg | ORAL_TABLET | Freq: Four times a day (QID) | ORAL | Status: DC | PRN
  Administered 2023-02-17: 4 mg via ORAL
  Filled 2023-02-13: qty 1

## 2023-02-13 MED ORDER — SODIUM CHLORIDE 0.9 % IV SOLN
500.0000 mg | INTRAVENOUS | Status: DC
  Administered 2023-02-13 – 2023-02-14 (×2): 500 mg via INTRAVENOUS
  Filled 2023-02-13 (×2): qty 5

## 2023-02-13 MED ORDER — POLYETHYLENE GLYCOL 3350 17 G PO PACK
17.0000 g | PACK | Freq: Every day | ORAL | Status: DC
  Administered 2023-02-14 – 2023-02-16 (×3): 17 g via ORAL
  Filled 2023-02-13 (×4): qty 1

## 2023-02-13 MED ORDER — SODIUM CHLORIDE 0.9 % IV SOLN
2.0000 g | Freq: Once | INTRAVENOUS | Status: AC
  Administered 2023-02-13: 2 g via INTRAVENOUS
  Filled 2023-02-13: qty 20

## 2023-02-13 MED ORDER — SODIUM CHLORIDE 0.9 % IV SOLN
2.0000 g | INTRAVENOUS | Status: DC
  Administered 2023-02-14: 2 g via INTRAVENOUS
  Filled 2023-02-13: qty 20

## 2023-02-13 MED ORDER — APIXABAN 5 MG PO TABS
5.0000 mg | ORAL_TABLET | Freq: Two times a day (BID) | ORAL | Status: DC
  Administered 2023-02-13 – 2023-02-19 (×12): 5 mg via ORAL
  Filled 2023-02-13 (×12): qty 1

## 2023-02-13 MED ORDER — ONDANSETRON HCL 4 MG/2ML IJ SOLN
4.0000 mg | Freq: Four times a day (QID) | INTRAMUSCULAR | Status: DC | PRN
  Administered 2023-02-13 – 2023-02-19 (×12): 4 mg via INTRAVENOUS
  Filled 2023-02-13 (×12): qty 2

## 2023-02-13 MED ORDER — LOSARTAN POTASSIUM 50 MG PO TABS
25.0000 mg | ORAL_TABLET | Freq: Every day | ORAL | Status: DC
  Administered 2023-02-13 – 2023-02-18 (×6): 25 mg via ORAL
  Filled 2023-02-13 (×6): qty 1

## 2023-02-13 MED ORDER — ACETAMINOPHEN 325 MG PO TABS
650.0000 mg | ORAL_TABLET | Freq: Four times a day (QID) | ORAL | Status: DC | PRN
  Administered 2023-02-13 – 2023-02-14 (×2): 650 mg via ORAL
  Filled 2023-02-13 (×2): qty 2

## 2023-02-13 MED ORDER — LACTATED RINGERS IV SOLN: INTRAVENOUS | Status: DC

## 2023-02-13 MED ORDER — CARVEDILOL 3.125 MG PO TABS
6.2500 mg | ORAL_TABLET | Freq: Two times a day (BID) | ORAL | Status: DC
  Administered 2023-02-14 – 2023-02-18 (×8): 6.25 mg via ORAL
  Filled 2023-02-13 (×10): qty 2

## 2023-02-13 MED ORDER — INFLUENZA VAC A&B SURF ANT ADJ 0.5 ML IM SUSY
0.5000 mL | PREFILLED_SYRINGE | INTRAMUSCULAR | Status: AC
  Administered 2023-02-14: 0.5 mL via INTRAMUSCULAR
  Filled 2023-02-13 (×2): qty 0.5

## 2023-02-13 MED ORDER — FERROUS SULFATE 325 (65 FE) MG PO TABS
325.0000 mg | ORAL_TABLET | Freq: Every day | ORAL | Status: DC
  Administered 2023-02-13 – 2023-02-19 (×7): 325 mg via ORAL
  Filled 2023-02-13 (×7): qty 1

## 2023-02-13 MED ORDER — ALBUTEROL SULFATE (2.5 MG/3ML) 0.083% IN NEBU: 2.5000 mg | INHALATION_SOLUTION | Freq: Four times a day (QID) | RESPIRATORY_TRACT | Status: DC | PRN

## 2023-02-13 MED ORDER — LEVOTHYROXINE SODIUM 25 MCG PO TABS
25.0000 ug | ORAL_TABLET | Freq: Every day | ORAL | Status: DC
  Administered 2023-02-14 – 2023-02-18 (×5): 25 ug via ORAL
  Filled 2023-02-13 (×6): qty 1

## 2023-02-13 MED ORDER — ACETAMINOPHEN 650 MG RE SUPP: 650.0000 mg | Freq: Four times a day (QID) | RECTAL | Status: DC | PRN

## 2023-02-13 MED ORDER — ROSUVASTATIN CALCIUM 20 MG PO TABS
40.0000 mg | ORAL_TABLET | Freq: Every day | ORAL | Status: DC
  Administered 2023-02-13 – 2023-02-19 (×7): 40 mg via ORAL
  Filled 2023-02-13 (×7): qty 2

## 2023-02-13 NOTE — ED Notes (Signed)
Wife at the bedside

## 2023-02-13 NOTE — ED Provider Notes (Signed)
3:37 PM Patient signed out to me by previous ED physician. Patient is a 72 yo male presenting for AMS and fever. Hx of recent AVR endocarditis.  WBC 26.7 LA 0.7 Ceft and azithro given for sepsis.  CT chest/abd/pelvis: 1. No acute findings within the chest, abdomen or pelvis. No source of sepsis.  CTH: no acute process.   Plan: Call back from admitting.   Physical Exam  BP 114/60   Pulse 83   Temp 99.6 F (37.6 C) (Oral)   Resp 16   Ht 6' 1.5" (1.867 m)   Wt 79.6 kg   SpO2 94%   BMI 22.84 kg/m   Physical Exam  Procedures  Procedures  ED Course / MDM   Clinical Course as of 02/13/23 1537  Sat Feb 13, 2023  1502 Spoke with his wife over the phone.  She said he has been intermittently confused and having labile behavior since his heart valve replacement.  She said it has been worse just recently a months where she called the ambulance.  She said he has been off antibiotics for a few weeks now. [MB]    Clinical Course User Index [MB] Terrilee Files, MD   Medical Decision Making Amount and/or Complexity of Data Reviewed Labs: ordered. Radiology: ordered. ECG/medicine tests: ordered.  Risk Prescription drug management. Decision regarding hospitalization.   UA demonstrates 21-50 and positive for Nitrites. Rocephin already given.  Patient's altered mental status and fevers possibly related to UTI.  Unable to rule out cardiac etiology at this time. I spoke with Dr. Thomes Dinning regarding patients symptoms who requests consult to cardiology to see if patient is appropriate for [facility] versus [facility].   I spoke with on call cardiology who recommends hospitalist eval and transfer if they feel it is appropriate. Dr. Thomes Dinning up to date on urine study. Will admit here at [facility] due to stable vitals at this time with source of infection.       Franne Forts, DO 02/13/23 1727

## 2023-02-13 NOTE — ED Notes (Signed)
Transported to CT 

## 2023-02-13 NOTE — ED Provider Notes (Signed)
[place] EMERGENCY DEPARTMENT AT [facility] Provider Note   CSN: [id number] Arrival date & time: 02/13/23  1328     History  No chief complaint on file.   [name] is a 72 y.o. male.  He has a history of DVT, diastolic heart failure, chronic stasis, stroke, TAVR with recent admission for endocarditis.  He was discharged on ceftriaxone to rehab and is now at home.  He is brought in by ambulance today from home with altered mental status.  Level 5 caveat secondary to altered mental status.  Patient himself denies any complaints.  He is a very poor historian though.  He denies any cough chest pain shortness of breath abdominal pain vomiting or diarrhea.  The history is provided by the patient, the EMS personnel and the spouse.  Altered Mental Status Presenting symptoms: confusion   Timing:  Constant Progression:  Unchanged Chronicity:  New Context: recent infection        Home Medications Prior to Admission medications   Medication Sig Start Date End Date Taking? Authorizing Provider  albuterol (PROVENTIL) (2.5 MG/3ML) 0.083% nebulizer solution Take 2.5 mg by nebulization every 6 (six) hours as needed for wheezing or shortness of breath.    [provider]  albuterol (VENTOLIN HFA) 108 (90 Base) MCG/ACT inhaler Inhale 1 puff into the lungs every 4 (four) hours as needed for wheezing or shortness of breath.    [provider]  ALPRAZolam Prudy Feeler) 0.5 MG tablet Take 1 tablet (0.5 mg total) by mouth 3 (three) times daily as needed for anxiety or sleep. 01/08/23   Glade Lloyd, MD  apixaban (ELIQUIS) 5 MG TABS tablet Take 5 mg by mouth 2 (two) times daily.    [provider]  carvedilol (COREG) 12.5 MG tablet Take 0.5 tablets (6.25 mg total) by mouth 2 (two) times daily with a meal. 01/08/23   Glade Lloyd, MD  Cholecalciferol (VITAMIN D-3) 125 MCG (5000 UT) TABS Take 5,000 Units by mouth daily.    [provider]  cyclobenzaprine  (FLEXERIL) 5 MG tablet Take 5 mg by mouth 3 (three) times daily as needed. 11/12/22   [provider]  EPINEPHrine 0.3 mg/0.3 mL IJ SOAJ injection Inject 0.3 mg into the muscle once as needed for anaphylaxis. 09/09/21   [provider]  ferrous sulfate 325 (65 FE) MG tablet Take 325 mg by mouth daily. 12/07/22   [provider]  fluticasone-salmeterol (ADVAIR) 250-50 MCG/ACT AEPB Inhale 1 puff into the lungs in the morning and at bedtime. Rinse mouth well with water after each use    [provider]  levothyroxine (SYNTHROID) 25 MCG tablet Take 1 tablet (25 mcg total) by mouth daily at 6 (six) AM. 01/09/23   Hanley Ben, Kshitiz, MD  lidocaine (LIDODERM) 5 % Place 1 patch onto the skin daily. Remove & Discard patch within 12 hours or as directed by MD 01/08/23   Glade Lloyd, MD  losartan (COZAAR) 25 MG tablet Take 1 tablet (25 mg total) by mouth daily. 12/25/22   Elgergawy, Leana Roe, MD  Magnesium Hydroxide (MILK OF MAGNESIA PO) Take 1 Dose by mouth as needed (constipation). 03/25/17   [provider]  morphine (MSIR) 15 MG tablet Take 1 tablet (15 mg total) by mouth every 4 (four) hours as needed for severe pain. 01/08/23   Glade Lloyd, MD  Multiple Vitamin (MULTIVITAMIN WITH MINERALS) TABS tablet Take 1 tablet by mouth at bedtime.    [provider]  nitroGLYCERIN (  NITROSTAT) 0.4 MG SL tablet Place 1 tablet (0.4 mg total) under the tongue every 5 (five) minutes as needed for chest pain. 08/07/19   Janetta Hora, PA-C  polyethylene glycol powder (GLYCOLAX/MIRALAX) 17 GM/SCOOP powder Take 1 Container by mouth 2 (two) times daily.    [provider]  promethazine (PHENERGAN) 25 MG tablet Take 25 mg by mouth 4 (four) times daily as needed for nausea or vomiting. 03/25/17   [provider]  rosuvastatin (CRESTOR) 40 MG tablet Take 40 mg by mouth daily.    [provider]  sertraline (ZOLOFT) 100 MG tablet Take 150 mg by mouth  daily. 09/09/21   [provider]      Allergies    Bee venom, Bystolic [nebivolol hcl], Codeine, Doxycycline, Iodinated contrast media, Iohexol, Levaquin [levofloxacin], Oxycodone hcl, Stadol [butorphanol], Succinylcholine chloride, Tamiflu [oseltamivir phosphate], Atorvastatin, Synvisc [hylan g-f 20], Gemfibrozil, Latex, Lisinopril, Omeprazole, Dilaudid [hydromorphone hcl], Methocarbamol, Niacin, and Pentazocine lactate    Review of Systems   Review of Systems  Unable to perform ROS: Mental status change  Psychiatric/Behavioral:  Positive for confusion.     Physical Exam Updated Vital Signs BP 121/69 (BP Location: Right Arm)   Pulse 94   Temp 99.6 F (37.6 C) (Oral)   Resp 15   Ht 6' 1.5" (1.867 m)   Wt 79.6 kg   SpO2 94%   BMI 22.84 kg/m  Physical Exam Vitals and nursing note reviewed.  Constitutional:      General: He is not in acute distress.    Appearance: Normal appearance. He is well-developed.  HENT:     Head: Normocephalic and atraumatic.  Eyes:     Conjunctiva/sclera: Conjunctivae normal.  Cardiovascular:     Rate and Rhythm: Normal rate and regular rhythm.     Heart sounds: Murmur (SEM) heard.  Pulmonary:     Effort: Pulmonary effort is normal. No respiratory distress.     Breath sounds: Normal breath sounds.  Abdominal:     General: There is distension.     Palpations: Abdomen is soft.     Tenderness: There is abdominal tenderness. There is no guarding or rebound.  Musculoskeletal:        General: No deformity. Normal range of motion.     Cervical back: Neck supple.  Skin:    General: Skin is warm and dry.     Capillary Refill: Capillary refill takes less than 2 seconds.     Comments: He has a small skin tear on the back of his left hand  Neurological:     General: No focal deficit present.     ED Results / Procedures / Treatments   Labs (all labs ordered are listed, but only abnormal results are displayed) Labs Reviewed  COMPREHENSIVE  METABOLIC PANEL - Abnormal; Notable for the following components:      Result Value   Sodium 128 (*)    Chloride 97 (*)    CO2 21 (*)    Glucose, Bld 118 (*)    Calcium 8.6 (*)    Albumin 2.9 (*)    All other components within normal limits  CBC WITH DIFFERENTIAL/PLATELET - Abnormal; Notable for the following components:   WBC 26.7 (*)    RBC 3.85 (*)    Hemoglobin 9.5 (*)    HCT 29.7 (*)    MCV 77.1 (*)    MCH 24.7 (*)    RDW 16.8 (*)    Neutro Abs 25.1 (*)  All other components within normal limits  URINALYSIS, W/ REFLEX TO CULTURE (INFECTION SUSPECTED) - Abnormal; Notable for the following components:   Hgb urine dipstick MODERATE (*)    Ketones, ur 5 (*)    Protein, ur 30 (*)    Nitrite POSITIVE (*)    Leukocytes,Ua SMALL (*)    Bacteria, UA RARE (*)    All other components within normal limits  BRAIN NATRIURETIC PEPTIDE - Abnormal; Notable for the following components:   B Natriuretic Peptide 377.0 (*)    All other components within normal limits  CULTURE, BLOOD (ROUTINE X 2)  SARS CORONAVIRUS 2 BY RT PCR  CULTURE, BLOOD (ROUTINE X 2)  URINE CULTURE  LACTIC ACID, PLASMA  LACTIC ACID, PLASMA  PROTIME-INR  APTT  AMMONIA  TROPONIN I (HIGH SENSITIVITY)  TROPONIN I (HIGH SENSITIVITY)    EKG EKG Interpretation Date/Time:  Saturday February 13 2023 13:55:09 EDT Ventricular Rate:  93 PR Interval:  166 QRS Duration:  91 QT Interval:  363 QTC Calculation: 452 R Axis:   -31  Text Interpretation: Sinus rhythm Left atrial enlargement Left ventricular hypertrophy Inferior infarct, age indeterminate Lateral leads are also involved No significant change since prior 7/24 Confirmed by Meridee Score [phone]) on 02/13/2023 1:59:28 PM  Radiology CT CHEST ABDOMEN PELVIS WO CONTRAST  Result Date: 02/13/2023 CLINICAL DATA:  Sepsis"brought in by [facility] from home with c/o AMS. Wife reports he was "slower than normal". EMS reports he was alert but confused when they arrived.  Normally A&O x 4. Vitals for EMS-Temp 101, HR 100, NSR, BP 101/67, ETCO2 20. Pt is alert to self, place, and situation, disoriented only to time upon arrival to ED. Pt denies pain. Wife reported to EMS that he has an "aortic infection" and is currently on 2 strong antibiotics, but EMS is unaware of which ones they are. " EXAM: CT CHEST, ABDOMEN AND PELVIS WITHOUT CONTRAST TECHNIQUE: Multidetector CT imaging of the chest, abdomen and pelvis was performed following the standard protocol without IV contrast. RADIATION DOSE REDUCTION: This exam was performed according to the departmental dose-optimization program which includes automated exposure control, adjustment of the mA and/or kV according to patient size and/or use of iterative reconstruction technique. COMPARISON:  12/09/2022. FINDINGS: CT CHEST FINDINGS Cardiovascular: Heart normal in size. No pericardial effusion. Left coronary artery calcifications. Stable aortic valve replacement. Great vessels normal in caliber. Aortic atherosclerotic calcifications. Mediastinum/Nodes: No neck base, mediastinal or hilar masses. No enlarged lymph nodes. Trachea esophagus are unremarkable. Lungs/Pleura: Minimal dependent subsegmental atelectasis. No evidence of pneumonia or pulmonary edema. No lung mass. No suspicious nodule. No pleural effusion or pneumothorax. Musculoskeletal: No fracture or acute finding. No bone lesion. No chest wall mass. CT ABDOMEN PELVIS FINDINGS Hepatobiliary: Unremarkable liver. Status post cholecystectomy. No bile duct dilation. Pancreas: Pancreatic atrophy. No mass, duct dilation or inflammation. Spleen: Normal in size without focal abnormality. Adrenals/Urinary Tract: No adrenal mass. Kidneys normal in size, orientation and position. 12 mm low-attenuation renal mass, midpole of the right kidney, consistent with a cyst, no follow-up recommended. No other renal mass. No stone. No hydronephrosis. Ureters are normal in course and in caliber. Bladder  is unremarkable. Stomach/Bowel: Surgical vascular clips at the gastroesophageal junction, stable. Stomach mostly decompressed, otherwise unremarkable. Small bowel normal in caliber. Scattered small bowel air-fluid levels. No small bowel wall thickening or inflammation. Colon is distended with marked increased stool burden. Right colon measures up to 9.6 cm in diameter. No colonic wall thickening or inflammation. No evidence of obstruction.  Stool moderately distends the rectum. Vascular/Lymphatic: Aortic atherosclerosis. No enlarged lymph nodes. Reproductive: Prostate normal in size. Other: No abdominal wall hernia or abnormality. No abdominopelvic ascites. Musculoskeletal: No acute fracture or acute finding. Mild depression of the upper endplate of L1 with associated Schmorl's nodes and sclerosis, stable from the prior exam. Stable degenerative changes. No aggressive bone lesion. IMPRESSION: 1. No acute findings within the chest, abdomen or pelvis. No source of sepsis. 2. Marked increased colonic stool consistent with constipation. No bowel obstruction or inflammation. 3. Multiple stable chronic findings detailed above. Electronically Signed   By: Amie Portland M.D.   On: 02/13/2023 15:31   CT Head Wo Contrast  Result Date: 02/13/2023 CLINICAL DATA:  Mental status change"brought in by [facility] from home with c/o AMS. Wife reports he was "slower than normal". EMS reports he was alert but confused when they arrived. Normally A&O x 4. Vitals for EMS-Temp 101, HR 100, NSR, BP 101/67, ETCO2 20. Pt is alert to self, place, and situation, disoriented only to time upon arrival to ED. Pt denies pain. Wife reported to EMS that he has an "aortic infection" and is currently on 2 strong antibiotics, but EMS is unaware of which ones they are. " EXAM: CT HEAD WITHOUT CONTRAST TECHNIQUE: Contiguous axial images were obtained from the base of the skull through the vertex without intravenous contrast. RADIATION DOSE REDUCTION: This  exam was performed according to the departmental dose-optimization program which includes automated exposure control, adjustment of the mA and/or kV according to patient size and/or use of iterative reconstruction technique. COMPARISON:  12/09/2022. FINDINGS: Brain: No evidence of acute infarction, hemorrhage, hydrocephalus, extra-axial collection or mass lesion/mass effect. Ventricle and sulcal enlargement reflecting moderate atrophy. Focus of hypoattenuation noted in posterior left frontal lobe consistent with an old infarct, stable. Vascular: No hyperdense vessel or unexpected calcification. Skull: Normal. Negative for fracture or focal lesion. Sinuses/Orbits: Globes and orbits are unremarkable. Moderate mucosal thickening lines the right frontal and bilateral ethmoids sinuses with mild mucosal thickening along the anterior sphenoid and bilateral maxillary sinuses. Other: None. IMPRESSION: 1. No acute intracranial abnormalities. 2. Stable intracranial appearance from the prior head CT. Electronically Signed   By: Amie Portland M.D.   On: 02/13/2023 15:21   DG Chest Port 1 View  Result Date: 02/13/2023 CLINICAL DATA:  Sepsis EXAM: PORTABLE CHEST 1 VIEW COMPARISON:  12/11/2022 FINDINGS: Single frontal view of the chest demonstrates a stable cardiac silhouette. Aortic valve prosthesis again noted. Pulmonary vascular congestion, with patchy bibasilar consolidation left greater than right. No effusion or pneumothorax. No acute bony abnormalities. IMPRESSION: 1. Patchy bibasilar consolidation, left greater than right, which could reflect infection or asymmetric edema. 2. Increased pulmonary vascular congestion. Electronically Signed   By: Sharlet Salina M.D.   On: 02/13/2023 14:14    Procedures .Critical Care  Performed by: Terrilee Files, MD Authorized by: Terrilee Files, MD   Critical care provider statement:    Critical care time (minutes):  45   Critical care time was exclusive of:  Separately  billable procedures and treating other patients   Critical care was necessary to treat or prevent imminent or life-threatening deterioration of the following conditions:  Sepsis and CNS failure or compromise   Critical care was time spent personally by me on the following activities:  Development of treatment plan with patient or surrogate, discussions with consultants, evaluation of patient's response to treatment, examination of patient, obtaining history from patient or surrogate, ordering and performing treatments and  interventions, ordering and review of laboratory studies, ordering and review of radiographic studies, pulse oximetry, re-evaluation of patient's condition and review of old charts   I assumed direction of critical care for this patient from another provider in my specialty: no       Medications Ordered in ED Medications  lactated ringers infusion ( Intravenous New Bag/Given 02/13/23 1407)  azithromycin (ZITHROMAX) 500 mg in sodium chloride 0.9 % 250 mL IVPB (0 mg Intravenous Stopped 02/13/23 1616)  cefTRIAXone (ROCEPHIN) 2 g in sodium chloride 0.9 % 100 mL IVPB (0 g Intravenous Stopped 02/13/23 1616)    ED Course/ Medical Decision Making/ A&P Clinical Course as of 02/13/23 1743  Sat Feb 13, 2023  1502 Spoke with his wife over the phone.  She said he has been intermittently confused and having labile behavior since his heart valve replacement.  She said it has been worse just recently a months where she called the ambulance.  She said he has been off antibiotics for a few weeks now. [MB]    Clinical Course User Index [MB] Terrilee Files, MD                                 Medical Decision Making Amount and/or Complexity of Data Reviewed Labs: ordered. Radiology: ordered. ECG/medicine tests: ordered.  Risk Prescription drug management. Decision regarding hospitalization.   This patient complains of possible altered mental status confusion fever; this involves an  extensive number of treatment Options and is a complaint that carries with it a high risk of complications and morbidity. The differential includes sepsis, Sirs, pneumonia, bacteremia, endocarditis, UTI, stroke, bleed  I ordered, reviewed and interpreted labs, which included CBC with elevated white count stable low hemoglobin, chemistries with low sodium low bicarb, BNP elevated, troponins flat, lactate normal, COVID-negative, urinalysis possible signs of infection, blood culture sent I ordered medication IV antibiotics and reviewed PMP when indicated. I ordered imaging studies which included chest x-ray, head CT, CT chest abdomen pelvis and I independently    visualized and interpreted imaging which showed no acute findings Additional history obtained from EMS and patient's wife Previous records obtained and reviewed in epic including recent discharge summary Cardiac monitoring reviewed, sinus rhythm Social determinants considered, no significant barriers Critical Interventions: Initiation of IV antibiotics and workup for altered mental status and possible endocarditis  After the interventions stated above, I reevaluated the patient and found patient to be resting comfortably no distress with stable vitals Admission and further testing considered, patient's care signed out to Dr. Wallace Cullens to discuss with hospitalist regarding admission.  Likely will need [facility] as will probably need cardiology input and may need a TEE         Final Clinical Impression(s) / ED Diagnoses Final diagnoses:  Altered mental status, unspecified altered mental status type  Sepsis, due to unspecified organism, unspecified whether acute organ dysfunction present [facility])  H/O endocarditis  Acute cystitis without hematuria    Rx / DC Orders ED Discharge Orders     None         Terrilee Files, MD 02/13/23 1747

## 2023-02-13 NOTE — ED Triage Notes (Addendum)
Pt brought in by [facility] from home with c/o AMS. Wife reports he was "slower than normal". EMS reports he was alert but confused when they arrived. Normally A&O x 4. Vitals for EMS-Temp 101, HR 100, NSR, BP 101/67, ETCO2 20. Pt is alert to self, place, and situation, disoriented only to time upon arrival to ED. Pt denies pain. Wife reported to EMS that he has an "aortic infection" and is currently on 2 strong antibiotics, but EMS is unaware of which ones they are.

## 2023-02-13 NOTE — ED Notes (Signed)
Admitting provider at the bedside for assessment.

## 2023-02-13 NOTE — ED Notes (Signed)
ED Provider at bedside. 

## 2023-02-13 NOTE — H&P (Signed)
History and Physical    Patient: [name]:[id number] DOB: 1951-03-08 DOA: 02/13/2023 DOS: the patient was seen and examined on 02/13/2023 PCP: [name], MD  Patient coming from: Home  Chief Complaint: No chief complaint on file.  HPI: [name] is a 72 y.o. male with medical history significant of hypertension, atrial fibrillation on anticoagulation, TAVR, COPD, GERD, mild dementia, prior CVA x 2 who presents to the emergency department from home via EMS due to altered mental status which started yesterday. 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  Patient complained of subjective fever yesterday, wife activated EMS due to altered mental status was described as " slower than normal".  On arrival of EMS team, patient was said to have fever of 101F and was taken to the ED for further evaluation and management.  Patient was reported to be confused on arrival to the ED. he denies chest pain, shortness of breath, vomiting, diarrhea, abdominal pain.  ED Course:  In the emergency department, he was hemodynamically stable.  Workup in the ED showed WBC 26.7, hemoglobin 9.5, hematocrit 29.7, MCV 77.1, platelets 382.  BMP sodium 128, potassium 3.5, chloride 97, bicarb 21, blood glucose 118, BUN 16, creatinine 0.83, albumin 2.9, ammonia 12, lactic acid was normal, troponin x 2 was normal.  SARS coronavirus 2 was negative.  Blood culture pending CT chest abdomen and pelvis showed no acute findings within the chest, abdomen or pelvis.  No source of sepsis.  Marked increased colonic stool consistent with constipation.  No bowel obstruction or inflammation. CT head without contrast showed no acute intracranial abnormalities Chest x-ray showed patchy bibasilar consolidation,  left greater than right, which could reflect infection or asymmetric edema.  Increased pulmonary vascular congestion. Patient was empirically started on ceftriaxone and azithromycin.  IV hydration was provided. Hospitalist was asked to admit patient for further evaluation and management.   Review of Systems: Review of systems as noted in the HPI. All other systems reviewed and are negative.   Past Medical History:  Diagnosis Date   Anemia    Asthma    Back pain, chronic, followed at pain clinic 11/25/2011   Carotid artery stenosis 09/06/2014   By Dopplers in September 2017: Right ICA 40-59%.  Less than 40% left ICA.   Colon polyps    COPD (chronic obstructive pulmonary disease) (HCC)    Dementia (HCC)    Mild   Depression    Dyslipidemia (high LDL; low HDL)    statin intolerant; on fibrate   Edema leg from Venous Stasis    Venous stasis :wears compression stockings; 08/2012 dopplers - no DVT or thrombophlebitis; mild R Popliteal V reflux - no VNUS ablation candidate   GERD (gastroesophageal reflux disease)    Hearing loss    History of CVA (cerebrovascular accident)    History of diabetes mellitus    History of DVT (deep vein thrombosis)    Hypertension    very labile   Nephrolithiasis    Osteoarthritis    Other idiopathic peripheral autonomic neuropathy    Agent orange   PAF (paroxysmal atrial fibrillation) (HCC)    PONV (postoperative nausea and vomiting)    PTSD (post-traumatic stress disorder) 07/24/2015   per patient approach from foot of bed to awake; do  not apply any contricting pressure, also avoid approaching from behind with any loud noises    S/P TAVR (transcatheter aortic valve replacement) 08/08/2019   s/p TAVR w/ a 29 mm Edwards Sapien 3 THV via the TF approach by Drs Excell Seltzer and Cornelius Moras   Seizure [facility])    Severe aortic stenosis    Skull fracture (HCC)    Small bowel obstruction (HCC) 1990s, 2001, 2015   s/p multiple bowel surgeries; from war wounds   Past  Surgical History:  Procedure Laterality Date   APPENDECTOMY  1958   per patient   Carotid Dopplers  01/2016   Stable. RICA - slight progression from<40% to 40-59%.  Otherwise stable bilateral carotids and subclavian arteries. Stable LICA   COLONOSCOPY     DENTAL SURGERY  01/14/2017   5 extractions in preparation for LTKA on 01-25-17; also was started on amoxicillin x 7days; has since completed therapy    exploratory laparotomy with extensive lysis of adhesions  10/1999   JOINT REPLACEMENT     right knee replaced 2x   LEFT HEART CATHETERIZATION WITH CORONARY ANGIOGRAM N/A 11/26/2011   WNL Runell Gess, MD   NM MYOVIEW LTD  08/2011   Negative lexiscan myoview: No ischemia or infarction   RIGHT/LEFT HEART CATH AND CORONARY ANGIOGRAPHY N/A 10/22/2016   Procedure: Right/Left Heart Cath and Coronary Angiography;  Surgeon: Marykay Lex, MD;  Location: [facility] INVASIVE CV LAB:: Angiographically normal/minimal CAD with tortuous coronary arteries.  Mild pulmonary pretension secondary to severely elevated LVEDP and systemic hypertension.  Mild-moderate aortic valve stenosis with mean gradient 21 mmHg.   RIGHT/LEFT HEART CATH AND CORONARY ANGIOGRAPHY N/A 07/13/2019   Procedure: RIGHT/LEFT HEART CATH AND CORONARY ANGIOGRAPHY;  Surgeon: Marykay Lex, MD;  Location: [facility] INVASIVE CV LAB;  Service: Cardiovascular;; : Angiographically normal coronary arteries.  Hemodynamic findings consistent with severe stenosis.  Normal RHC Pressures.   small bowel obstruction  1996, 1999, 2001, 2015   TEE WITHOUT CARDIOVERSION N/A 08/08/2019   Procedure: TRANSESOPHAGEAL ECHOCARDIOGRAM (TEE);  Surgeon: Tonny Bollman, MD;  Location: [facility] INVASIVE CV LAB;  Service: Open Heart Surgery;  Laterality: N/A;   TEE WITHOUT CARDIOVERSION N/A 12/14/2022   Procedure: TRANSESOPHAGEAL ECHOCARDIOGRAM;  Surgeon: Chrystie Nose, MD;  Location: [facility] INVASIVE CV LAB;  Service: Cardiovascular;  Laterality: N/A;   TONSILLECTOMY  1960   per  patient   TOTAL KNEE ARTHROPLASTY     TOTAL KNEE ARTHROPLASTY Left 01/25/2017   Procedure: LEFT TOTAL KNEE ARTHROPLASTY;  Surgeon: Ollen Gross, MD;  Location: [facility];  Service: Orthopedics;  Laterality: Left;  Adductor Block   TRANSCATHETER AORTIC VALVE REPLACEMENT, TRANSFEMORAL N/A 08/08/2019   Procedure: TRANSCATHETER AORTIC VALVE REPLACEMENT, TRANSFEMORAL;  Surgeon: Tonny Bollman, MD;  Location: [facility] INVASIVE CV LAB;  Service: Open Heart Surgery;  Laterality: N/A;   TRANSTHORACIC ECHOCARDIOGRAM  06/07/2019    Severe calcific aortic stenosis-mean gradient up to 49 mmHg the peak of 79 mmHg.Mild-Mod AI.  Normal LV size and function.  EF 65 to 70%.  Mild concentric LVH with GR 1 DD.  Biatrial size-normal.  Moderate mitral valve calcification with mild MAC and mild MR -> compared to June 2020-increase in both gradients from mean 42 mmHg and peak 66 mmHg   TRANSTHORACIC ECHOCARDIOGRAM  07/2019   a) post TAVR echo 08/09/2019:POD #1 - EF 60-65%, nl fxn TAVR with a mAVg of 14 mm Hg and no PVL.;;  09/07/2019:  o 1 month echo showed EF 65%, normally functioning TAVR with a mean  gradient of 13 mm Hg and no PVL.   TRANSTHORACIC ECHOCARDIOGRAM  09/04/2020   1 year post TAVR:  EF 70 to 75%.  Hyperdynamic LV function.  GR 1 DD-mild LA dilation.[name]  No RWMA.  Normal RV function.  Unable to assess PAP-mildly elevated RAP.  TAVR valve in place.  Mean gradient 11 mmHg no PVL.  Mild ascending aortic dilation.  No change from previous echo   WRIST FRACTURE SURGERY      Social History:  reports that he has never smoked. He has quit using smokeless tobacco. He reports current alcohol use. He reports that he does not use drugs.   Allergies  Allergen Reactions   Bee Venom Anaphylaxis   Bystolic [Nebivolol Hcl] Other (See Comments)    Nightmares, flashbacks (PTSD)   Codeine Shortness Of Breath and Rash   Doxycycline Shortness Of Breath and Rash   Iodinated Contrast Media Shortness Of Breath and Anaphylaxis    Lost  consciousness/difficulty breathing Omni - Paque Contrast    Iohexol Hives, Shortness Of Breath and Rash    Neck and Torso erythemia IVP-DYE >> SHORTNESS OF BREATH    Levaquin [Levofloxacin] Hives and Shortness Of Breath   Oxycodone Hcl Shortness Of Breath, Swelling and Rash   Stadol [Butorphanol] Shortness Of Breath   Succinylcholine Chloride Shortness Of Breath, Nausea Only, Rash and Other (See Comments)    Difficulty breathing    Tamiflu [Oseltamivir Phosphate]     UNSPECIFIED REACTION    Atorvastatin     myalgias   Synvisc [Hylan G-F 20] Hives and Rash   Gemfibrozil     Other reaction(s): Serum cholesterol raised   Latex Other (See Comments)    blister   Lisinopril     Other reaction(s): Cough   Omeprazole    Dilaudid [Hydromorphone Hcl] Nausea Only and Rash    Aggressive    Methocarbamol Nausea Only and Rash   Niacin Rash   Pentazocine Lactate Rash    rash    Family History  Problem Relation Age of Onset   Stroke Mother    Heart disease Mother    Heart attack Mother    Heart disease Father    Heart attack Father    Heart attack Sister    Heart attack Brother    Hypertension Brother    Heart attack Son    Stroke Son    Hypertension Brother    Sleep apnea Son    Non-Hodgkin's lymphoma Daughter      Prior to Admission medications   Medication Sig Start Date End Date Taking? Authorizing Provider  albuterol (PROVENTIL) (2.5 MG/3ML) 0.083% nebulizer solution Take 2.5 mg by nebulization every 6 (six) hours as needed for wheezing or shortness of breath.   Yes [provider]  albuterol (VENTOLIN HFA) 108 (90 Base) MCG/ACT inhaler Inhale 1-2 puffs into the lungs every 4 (four) hours as needed for wheezing or shortness of breath.   Yes [provider]  ALPRAZolam (XANAX) 0.5 MG tablet Take 1 tablet (0.5 mg total) by mouth 3 (three) times daily as needed for anxiety or sleep. 01/08/23  Yes Glade Lloyd, MD  apixaban (ELIQUIS) 5 MG TABS tablet Take 5  mg by mouth 2 (two) times daily.   Yes [provider]  carvedilol (COREG) 12.5 MG tablet Take 0.5 tablets (6.25 mg total) by mouth 2 (two) times daily with a meal. 01/08/23  Yes Alekh, Kshitiz, MD  Cholecalciferol (VITAMIN D-3) 125 MCG (5000 UT) TABS Take 5,000 Units  by mouth daily.   Yes [provider]  cyclobenzaprine (FLEXERIL) 5 MG tablet Take 5 mg by mouth 3 (three) times daily as needed. 11/12/22  Yes [provider]  EPINEPHrine 0.3 mg/0.3 mL IJ SOAJ injection Inject 0.3 mg into the muscle once as needed for anaphylaxis. 09/09/21  Yes [provider]  ferrous sulfate 325 (65 FE) MG tablet Take 325 mg by mouth daily. 12/07/22  Yes [provider]  fluticasone-salmeterol (ADVAIR) 250-50 MCG/ACT AEPB Inhale 1 puff into the lungs in the morning and at bedtime. Rinse mouth well with water after each use   Yes [provider]  levothyroxine (SYNTHROID) 25 MCG tablet Take 1 tablet (25 mcg total) by mouth daily at 6 (six) AM. 01/09/23  Yes Hanley Ben, Kshitiz, MD  lidocaine (LIDODERM) 5 % Place 1 patch onto the skin daily. Remove & Discard patch within 12 hours or as directed by MD Patient taking differently: Place 1 patch onto the skin daily as needed (FOR PAIN). Remove & Discard patch within 12 hours or as directed by MD 01/08/23  Yes Glade Lloyd, MD  losartan (COZAAR) 25 MG tablet Take 1 tablet (25 mg total) by mouth daily. 12/25/22  Yes Elgergawy, Leana Roe, MD  morphine (MSIR) 15 MG tablet Take 1 tablet (15 mg total) by mouth every 4 (four) hours as needed for severe pain. Patient taking differently: Take 15 mg by mouth 4 (four) times daily. 01/08/23  Yes Glade Lloyd, MD  Multiple Vitamin (MULTIVITAMIN WITH MINERALS) TABS tablet Take 1 tablet by mouth at bedtime.   Yes [provider]  nitroGLYCERIN (NITROSTAT) 0.4 MG SL tablet Place 1 tablet (0.4 mg total) under the tongue every 5 (five) minutes as needed for chest pain. 08/07/19  Yes Janetta Hora, PA-C  polyethylene glycol powder (GLYCOLAX/MIRALAX) 17 GM/SCOOP powder Take 1 Container by mouth 2 (two) times daily as needed for mild constipation or moderate constipation.   Yes [provider]  promethazine (PHENERGAN) 25 MG tablet Take 25 mg by mouth 4 (four) times daily as needed for nausea or vomiting. 03/25/17  Yes [provider]  rosuvastatin (CRESTOR) 40 MG tablet Take 40 mg by mouth daily.   Yes [provider]  sertraline (ZOLOFT) 100 MG tablet Take 150 mg by mouth daily. 09/09/21  Yes [provider]    Physical Exam: BP 105/61 (BP Location: Right Arm)   Pulse 65   Temp 98 F (36.7 C) (Oral)   Resp 17   Ht 6' 1.5" (1.867 m)   Wt 79.6 kg   SpO2 98%   BMI 22.84 kg/m   General: 72 y.o. year-old male ill appearing, but in no acute distress.  Alert and oriented x3. HEENT: NCAT, EOMI, dry mucous membrane Neck: Supple, trachea medial Cardiovascular: Regular rate and rhythm with no rubs or gallops.  No thyromegaly or JVD noted.  No lower extremity edema. 2/4 pulses in all 4 extremities. Respiratory: Clear to auscultation with no wheezes or rales. Good inspiratory effort. Abdomen: Soft, nontender nondistended with normal bowel sounds x4 quadrants. Muskuloskeletal: No cyanosis, clubbing or edema noted bilaterally Neuro: CN II-XII intact, strength 4/5 RUE, 5/5  LUE, 5/5 in lower extremities, sensation, reflexes intact Skin: No ulcerative lesions noted or rashes Psychiatry: Mood is appropriate for condition and setting          Labs on Admission:  Basic Metabolic Panel: Recent Labs  Lab 02/13/23 1405  NA 128*  K 3.5  CL 97*  CO2  21*  GLUCOSE 118*  BUN 16  CREATININE 0.83  CALCIUM 8.6*   Liver Function Tests: Recent Labs  Lab 02/13/23 1405  AST 19  ALT 19  ALKPHOS 88  BILITOT 0.5  PROT 7.5  ALBUMIN 2.9*   No results for input(s): "LIPASE", "AMYLASE" in the last 168 hours. Recent Labs  Lab 02/13/23 1405   AMMONIA 12   CBC: Recent Labs  Lab 02/13/23 1405  WBC 26.7*  NEUTROABS 25.1*  HGB 9.5*  HCT 29.7*  MCV 77.1*  PLT 382   Cardiac Enzymes: No results for input(s): "CKTOTAL", "CKMB", "CKMBINDEX", "TROPONINI" in the last 168 hours.  BNP (last 3 results) Recent Labs    12/09/22 2125 02/13/23 1418  BNP 290.0* 377.0*    ProBNP (last 3 results) No results for input(s): "PROBNP" in the last 8760 hours.  CBG: No results for input(s): "GLUCAP" in the last 168 hours.  Radiological Exams on Admission: CT CHEST ABDOMEN PELVIS WO CONTRAST  Result Date: 02/13/2023 CLINICAL DATA:  Sepsis"brought in by [facility] from home with c/o AMS. Wife reports he was "slower than normal". EMS reports he was alert but confused when they arrived. Normally A&O x 4. Vitals for EMS-Temp 101, HR 100, NSR, BP 101/67, ETCO2 20. Pt is alert to self, place, and situation, disoriented only to time upon arrival to ED. Pt denies pain. Wife reported to EMS that he has an "aortic infection" and is currently on 2 strong antibiotics, but EMS is unaware of which ones they are. " EXAM: CT CHEST, ABDOMEN AND PELVIS WITHOUT CONTRAST TECHNIQUE: Multidetector CT imaging of the chest, abdomen and pelvis was performed following the standard protocol without IV contrast. RADIATION DOSE REDUCTION: This exam was performed according to the departmental dose-optimization program which includes automated exposure control, adjustment of the mA and/or kV according to patient size and/or use of iterative reconstruction technique. COMPARISON:  12/09/2022. FINDINGS: CT CHEST FINDINGS Cardiovascular: Heart normal in size. No pericardial effusion. Left coronary artery calcifications. Stable aortic valve replacement. Great vessels normal in caliber. Aortic atherosclerotic calcifications. Mediastinum/Nodes: No neck base, mediastinal or hilar masses. No enlarged lymph nodes. Trachea esophagus are unremarkable. Lungs/Pleura: Minimal dependent subsegmental  atelectasis. No evidence of pneumonia or pulmonary edema. No lung mass. No suspicious nodule. No pleural effusion or pneumothorax. Musculoskeletal: No fracture or acute finding. No bone lesion. No chest wall mass. CT ABDOMEN PELVIS FINDINGS Hepatobiliary: Unremarkable liver. Status post cholecystectomy. No bile duct dilation. Pancreas: Pancreatic atrophy. No mass, duct dilation or inflammation. Spleen: Normal in size without focal abnormality. Adrenals/Urinary Tract: No adrenal mass. Kidneys normal in size, orientation and position. 12 mm low-attenuation renal mass, midpole of the right kidney, consistent with a cyst, no follow-up recommended. No other renal mass. No stone. No hydronephrosis. Ureters are normal in course and in caliber. Bladder is unremarkable. Stomach/Bowel: Surgical vascular clips at the gastroesophageal junction, stable. Stomach mostly decompressed, otherwise unremarkable. Small bowel normal in caliber. Scattered small bowel air-fluid levels. No small bowel wall thickening or inflammation. Colon is distended with marked increased stool burden. Right colon measures up to 9.6 cm in diameter. No colonic wall thickening or inflammation. No evidence of obstruction. Stool moderately distends the rectum. Vascular/Lymphatic: Aortic atherosclerosis. No enlarged lymph nodes. Reproductive: Prostate normal in size. Other: No abdominal wall hernia or abnormality. No abdominopelvic ascites. Musculoskeletal: No acute fracture or acute finding. Mild depression of the upper endplate of L1 with associated Schmorl's nodes and sclerosis, stable from the prior exam. Stable degenerative changes. No aggressive  bone lesion. IMPRESSION: 1. No acute findings within the chest, abdomen or pelvis. No source of sepsis. 2. Marked increased colonic stool consistent with constipation. No bowel obstruction or inflammation. 3. Multiple stable chronic findings detailed above. Electronically Signed   By: Amie Portland M.D.   On:  02/13/2023 15:31   CT Head Wo Contrast  Result Date: 02/13/2023 CLINICAL DATA:  Mental status change"brought in by [facility] from home with c/o AMS. Wife reports he was "slower than normal". EMS reports he was alert but confused when they arrived. Normally A&O x 4. Vitals for EMS-Temp 101, HR 100, NSR, BP 101/67, ETCO2 20. Pt is alert to self, place, and situation, disoriented only to time upon arrival to ED. Pt denies pain. Wife reported to EMS that he has an "aortic infection" and is currently on 2 strong antibiotics, but EMS is unaware of which ones they are. " EXAM: CT HEAD WITHOUT CONTRAST TECHNIQUE: Contiguous axial images were obtained from the base of the skull through the vertex without intravenous contrast. RADIATION DOSE REDUCTION: This exam was performed according to the departmental dose-optimization program which includes automated exposure control, adjustment of the mA and/or kV according to patient size and/or use of iterative reconstruction technique. COMPARISON:  12/09/2022. FINDINGS: Brain: No evidence of acute infarction, hemorrhage, hydrocephalus, extra-axial collection or mass lesion/mass effect. Ventricle and sulcal enlargement reflecting moderate atrophy. Focus of hypoattenuation noted in posterior left frontal lobe consistent with an old infarct, stable. Vascular: No hyperdense vessel or unexpected calcification. Skull: Normal. Negative for fracture or focal lesion. Sinuses/Orbits: Globes and orbits are unremarkable. Moderate mucosal thickening lines the right frontal and bilateral ethmoids sinuses with mild mucosal thickening along the anterior sphenoid and bilateral maxillary sinuses. Other: None. IMPRESSION: 1. No acute intracranial abnormalities. 2. Stable intracranial appearance from the prior head CT. Electronically Signed   By: Amie Portland M.D.   On: 02/13/2023 15:21   DG Chest Port 1 View  Result Date: 02/13/2023 CLINICAL DATA:  Sepsis EXAM: PORTABLE CHEST 1 VIEW COMPARISON:   12/11/2022 FINDINGS: Single frontal view of the chest demonstrates a stable cardiac silhouette. Aortic valve prosthesis again noted. Pulmonary vascular congestion, with patchy bibasilar consolidation left greater than right. No effusion or pneumothorax. No acute bony abnormalities. IMPRESSION: 1. Patchy bibasilar consolidation, left greater than right, which could reflect infection or asymmetric edema. 2. Increased pulmonary vascular congestion. Electronically Signed   By: Sharlet Salina M.D.   On: 02/13/2023 14:14    EKG: I independently viewed the EKG done and my findings are as followed: Normal sinus rhythm at rate of 93 bpm  Assessment/Plan Present on Admission:  Acute metabolic encephalopathy  Hyponatremia  Elevated brain natriuretic peptide (BNP) level  Microcytic anemia  Essential hypertension  Mixed hyperlipidemia  Constipation  Acquired hypothyroidism  SIRS (systemic inflammatory response syndrome) (HCC)  Physical deconditioning  Persistent atrial fibrillation (HCC)  Principal Problem:   Acute metabolic encephalopathy Active Problems:   Mixed hyperlipidemia   Constipation   Persistent atrial fibrillation (HCC)   Hyponatremia   Elevated brain natriuretic peptide (BNP) level   Microcytic anemia   Essential hypertension   SIRS (systemic inflammatory response syndrome) (HCC)   Physical deconditioning   Acquired hypothyroidism   Hypoalbuminemia due to protein-calorie malnutrition (HCC)   Generalized weakness  Acute Metabolic encephalopathy possibly secondary to multifactorial  SIRS possibly due to recurrent infective endocarditis/ UTI POA Patient with confusion, though alert and oriented x 3 (patient does not confusion at baseline). Patient presents with WBC of  26.7, he was febrile with a temperature of 101F per ED medical record (met SIRS criteria) Patient was recently treated for infective 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) per medical record which  was treated with IV ceftriaxone. He was also suspected to have UTI due to recent history of urinary incontinence and urinalysis being positive for nitrite and 21/50 WBC/hpf He was empirically started on IV ceftriaxone, we shall continue with same at this time with plan to de-escalate/discontinue based on blood culture/urine culture Continue Tylenol as needed Echocardiogram in the morning  Hyponatremia possibly secondary to dehydration Na 128 Continue gentle hydration Continue to monitor sodium with serial BMPs Urine osmolality, Serum osmolality and urine sodium will be checked  Elevated BNP r/o CHF BNP 377 Continue total input/output, daily weights  Continue heart healthy diet  Echocardiogram in the morning   Hypoalbuminemia possibly due to moderate protein calorie malnutrition Albumin 2.9, protein supplement will be provided  Microcytic anemia MCV 77.1, hemoglobin 9.5, hematocrit 29.7 This may be due to iron deficiency anemia considering the elevated RDW Iron studies will be done Continue ferrous sulfate per home regimen  Constipation Continue MiraLAX  Generalized weakness, deconditioning Continue PT/OT eval and treat  Essential hypertension Continue Coreg, losartan  History of TAVR Stable  COPD (not in acute exacerbation) Continue albuterol  Mixed hyperlipidemia Continue rosuvastatin   Persistent atrial fibrillation Continue Coreg, Eliquis   Acquired hypothyroidism Continue Synthroid  DVT prophylaxis: Eliquis  Advance Care Planning: CODE STATUS: Full code (confirmed with husband and wife at bedside)  Consults: None  Family Communication: Wife at bedside (all questions answered to satisfaction)  Severity of Illness: The appropriate patient status for this patient is INPATIENT. Inpatient status is judged to be reasonable and necessary in order to provide the required intensity of service to ensure the patient's safety. The patient's presenting symptoms,  physical exam findings, and initial radiographic and laboratory data in the context of their chronic comorbidities is felt to place them at high risk for further clinical deterioration. Furthermore, it is not anticipated that the patient will be medically stable for discharge from the hospital within 2 midnights of admission.   * I certify that at the point of admission it is my clinical judgment that the patient will require inpatient hospital care spanning beyond 2 midnights from the point of admission due to high intensity of service, high risk for further deterioration and high frequency of surveillance required.*  Author: Frankey Shown, DO 02/13/2023 7:52 PM  For on call review [email or web address].

## 2023-02-14 ENCOUNTER — Inpatient Hospital Stay (HOSPITAL_COMMUNITY): Payer: Medicare HMO

## 2023-02-14 DIAGNOSIS — I4819 Other persistent atrial fibrillation: Secondary | ICD-10-CM | POA: Diagnosis not present

## 2023-02-14 DIAGNOSIS — G9341 Metabolic encephalopathy: Secondary | ICD-10-CM

## 2023-02-14 DIAGNOSIS — E871 Hypo-osmolality and hyponatremia: Secondary | ICD-10-CM

## 2023-02-14 DIAGNOSIS — I509 Heart failure, unspecified: Secondary | ICD-10-CM

## 2023-02-14 DIAGNOSIS — I10 Essential (primary) hypertension: Secondary | ICD-10-CM

## 2023-02-14 LAB — IRON AND TIBC
Iron: 20 ug/dL — ABNORMAL LOW (ref 45–182)
Saturation Ratios: 9 % — ABNORMAL LOW (ref 17.9–39.5)
TIBC: 218 ug/dL — ABNORMAL LOW (ref 250–450)
UIBC: 198 ug/dL

## 2023-02-14 LAB — OSMOLALITY: Osmolality: 285 mosm/kg (ref 275–295)

## 2023-02-14 LAB — COMPREHENSIVE METABOLIC PANEL
ALT: 17 U/L (ref 0–44)
AST: 17 U/L (ref 15–41)
Albumin: 2.6 g/dL — ABNORMAL LOW (ref 3.5–5.0)
Alkaline Phosphatase: 76 U/L (ref 38–126)
Anion gap: 10 (ref 5–15)
BUN: 14 mg/dL (ref 8–23)
CO2: 23 mmol/L (ref 22–32)
Calcium: 8.7 mg/dL — ABNORMAL LOW (ref 8.9–10.3)
Chloride: 97 mmol/L — ABNORMAL LOW (ref 98–111)
Creatinine, Ser: 0.77 mg/dL (ref 0.61–1.24)
GFR, Estimated: >60 mL/min (ref >60)
Glucose, Bld: 133 mg/dL — ABNORMAL HIGH (ref 70–99)
Potassium: 3 mmol/L — ABNORMAL LOW (ref 3.5–5.1)
Sodium: 130 mmol/L — ABNORMAL LOW (ref 135–145)
Total Bilirubin: 0.7 mg/dL (ref 0.3–1.2)
Total Protein: 7.1 g/dL (ref 6.5–8.1)

## 2023-02-14 LAB — MAGNESIUM: Magnesium: 1.9 mg/dL (ref 1.7–2.4)

## 2023-02-14 LAB — CBC
HCT: 29.1 % — ABNORMAL LOW (ref 39.0–52.0)
Hemoglobin: 9.2 g/dL — ABNORMAL LOW (ref 13.0–17.0)
MCH: 24.5 pg — ABNORMAL LOW (ref 26.0–34.0)
MCHC: 31.6 g/dL (ref 30.0–36.0)
MCV: 77.6 fL — ABNORMAL LOW (ref 80.0–100.0)
Platelets: 402 10*3/uL — ABNORMAL HIGH (ref 150–400)
RBC: 3.75 MIL/uL — ABNORMAL LOW (ref 4.22–5.81)
RDW: 16.7 % — ABNORMAL HIGH (ref 11.5–15.5)
WBC: 23.3 10*3/uL — ABNORMAL HIGH (ref 4.0–10.5)
nRBC: 0 % (ref 0.0–0.2)

## 2023-02-14 LAB — OSMOLALITY, URINE: Osmolality, Ur: 450 mosm/kg (ref 300–900)

## 2023-02-14 LAB — BASIC METABOLIC PANEL
Anion gap: 10 (ref 5–15)
BUN: 14 mg/dL (ref 8–23)
CO2: 24 mmol/L (ref 22–32)
Calcium: 8.8 mg/dL — ABNORMAL LOW (ref 8.9–10.3)
Chloride: 98 mmol/L (ref 98–111)
Creatinine, Ser: 0.83 mg/dL (ref 0.61–1.24)
GFR, Estimated: >60 mL/min (ref >60)
Glucose, Bld: 101 mg/dL — ABNORMAL HIGH (ref 70–99)
Potassium: 3.3 mmol/L — ABNORMAL LOW (ref 3.5–5.1)
Sodium: 132 mmol/L — ABNORMAL LOW (ref 135–145)

## 2023-02-14 LAB — ECHOCARDIOGRAM COMPLETE
AR max vel: 1.78 cm2
AV Area VTI: 1.77 cm2
AV Area mean vel: 1.77 cm2
AV Mean grad: 14.5 mmHg
AV Peak grad: 27.7 mmHg
Ao pk vel: 2.63 m/s
Area-P 1/2: 1.48 cm2
Height: 73.5 in
MV VTI: 1.54 cm2
S' Lateral: 3.3 cm
Weight: 2888.91 [oz_av]

## 2023-02-14 LAB — PHOSPHORUS: Phosphorus: 3.2 mg/dL (ref 2.5–4.6)

## 2023-02-14 LAB — FERRITIN: Ferritin: 284 ng/mL (ref 24–336)

## 2023-02-14 MED ORDER — MOMETASONE FURO-FORMOTEROL FUM 200-5 MCG/ACT IN AERO
2.0000 | INHALATION_SPRAY | Freq: Two times a day (BID) | RESPIRATORY_TRACT | Status: DC
  Administered 2023-02-14 – 2023-02-19 (×10): 2 via RESPIRATORY_TRACT
  Filled 2023-02-14: qty 8.8

## 2023-02-14 MED ORDER — SODIUM CHLORIDE 0.9 % IV SOLN: INTRAVENOUS | Status: DC

## 2023-02-14 MED ORDER — VITAMIN D 25 MCG (1000 UNIT) PO TABS
5000.0000 [IU] | ORAL_TABLET | Freq: Every day | ORAL | Status: DC
  Administered 2023-02-15 – 2023-02-18 (×4): 5000 [IU] via ORAL
  Filled 2023-02-14 (×4): qty 5

## 2023-02-14 MED ORDER — CYCLOBENZAPRINE HCL 10 MG PO TABS: 5.0000 mg | ORAL_TABLET | Freq: Three times a day (TID) | ORAL | Status: DC | PRN

## 2023-02-14 MED ORDER — LIDOCAINE 5 % EX PTCH: 1.0000 | MEDICATED_PATCH | Freq: Every day | CUTANEOUS | Status: DC | PRN

## 2023-02-14 MED ORDER — ADULT MULTIVITAMIN W/MINERALS CH
1.0000 | ORAL_TABLET | Freq: Every day | ORAL | Status: DC
  Administered 2023-02-14 – 2023-02-18 (×5): 1 via ORAL
  Filled 2023-02-14 (×5): qty 1

## 2023-02-14 MED ORDER — ALPRAZOLAM 0.5 MG PO TABS
0.5000 mg | ORAL_TABLET | Freq: Three times a day (TID) | ORAL | Status: DC | PRN
  Administered 2023-02-16 – 2023-02-19 (×3): 0.5 mg via ORAL
  Filled 2023-02-14 (×3): qty 1

## 2023-02-14 MED ORDER — NITROGLYCERIN 0.4 MG SL SUBL: 0.4000 mg | SUBLINGUAL_TABLET | SUBLINGUAL | Status: DC | PRN

## 2023-02-14 MED ORDER — POLYETHYLENE GLYCOL 3350 17 G PO PACK: 17.0000 g | PACK | Freq: Two times a day (BID) | ORAL | Status: DC | PRN

## 2023-02-14 MED ORDER — MORPHINE SULFATE 15 MG PO TABS
15.0000 mg | ORAL_TABLET | Freq: Four times a day (QID) | ORAL | Status: DC | PRN
  Administered 2023-02-14 – 2023-02-18 (×9): 15 mg via ORAL
  Filled 2023-02-14 (×9): qty 1

## 2023-02-14 MED ORDER — PROMETHAZINE HCL 12.5 MG PO TABS
25.0000 mg | ORAL_TABLET | Freq: Four times a day (QID) | ORAL | Status: DC | PRN
  Administered 2023-02-15 – 2023-02-19 (×8): 25 mg via ORAL
  Filled 2023-02-14 (×8): qty 2

## 2023-02-14 MED ORDER — ORAL CARE MOUTH RINSE: 15.0000 mL | OROMUCOSAL | Status: DC | PRN

## 2023-02-14 MED ORDER — MORPHINE SULFATE (PF) 2 MG/ML IV SOLN
2.0000 mg | Freq: Once | INTRAVENOUS | Status: AC
  Administered 2023-02-14: 2 mg via INTRAVENOUS
  Filled 2023-02-14: qty 1

## 2023-02-14 MED ORDER — SERTRALINE HCL 50 MG PO TABS
150.0000 mg | ORAL_TABLET | Freq: Every day | ORAL | Status: DC
  Administered 2023-02-14 – 2023-02-18 (×5): 150 mg via ORAL
  Filled 2023-02-14 (×6): qty 3

## 2023-02-14 MED ORDER — MAGNESIUM SULFATE 2 GM/50ML IV SOLN
2.0000 g | Freq: Once | INTRAVENOUS | Status: AC
  Administered 2023-02-14: 2 g via INTRAVENOUS
  Filled 2023-02-14: qty 50

## 2023-02-14 MED ORDER — BISACODYL 10 MG RE SUPP
10.0000 mg | Freq: Once | RECTAL | Status: DC
  Filled 2023-02-14: qty 1

## 2023-02-14 MED ORDER — HYDRALAZINE HCL 20 MG/ML IJ SOLN
10.0000 mg | INTRAMUSCULAR | Status: DC | PRN
  Administered 2023-02-16 – 2023-02-17 (×2): 10 mg via INTRAVENOUS
  Filled 2023-02-14 (×2): qty 1

## 2023-02-14 MED ORDER — POTASSIUM CHLORIDE CRYS ER 20 MEQ PO TBCR
40.0000 meq | EXTENDED_RELEASE_TABLET | Freq: Two times a day (BID) | ORAL | Status: AC
  Administered 2023-02-14 (×2): 40 meq via ORAL
  Filled 2023-02-14 (×2): qty 2

## 2023-02-14 NOTE — TOC CM/SW Note (Signed)
CSW attempted to reach wife to complete high risk for readmission assessment. CSW unable to reach and left VM for return call. TOC to follow.

## 2023-02-14 NOTE — Progress Notes (Signed)
Patient c/o headache and nausea. PRN Zofran and tylenol given as ordered. Dr. Laural Benes notified of BP and patient complaints of headache and nausea. No new orders at this time.     02/14/23 1352  Vitals  BP (!) 176/94  MAP (mmHg) 117  BP Location Right Arm  BP Method Automatic  Patient Position (if appropriate) Lying  Pulse Rate 70  Pulse Rate Source Dinamap  MEWS COLOR  MEWS Score Color Green  MEWS Score  MEWS Temp 0  MEWS Systolic 0  MEWS Pulse 0  MEWS RR 0  MEWS LOC 0  MEWS Score 0

## 2023-02-14 NOTE — Progress Notes (Signed)
PROGRESS NOTE   [name]  BMW:[id number] DOB: Jul 15, 1950 DOA: 02/13/2023 PCP: [name], MD   No chief complaint on file.  Level of care: Med-Surg  Brief Admission History:  72 y.o. male with medical history significant of hypertension, atrial fibrillation on anticoagulation, TAVR, COPD, GERD, mild dementia, prior CVA x 2 who presents to the emergency department from home via EMS due to altered mental status which started yesterday. 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  Patient complained of subjective fever yesterday, wife activated EMS due to altered mental status was described as " slower than normal".  On arrival of EMS team, patient was said to have fever of 101F and was taken to the ED for further evaluation and management.  Patient was reported to be confused on arrival to the ED. he denies chest pain, shortness of breath, vomiting, diarrhea, abdominal pain.   ED Course:  In the emergency department, he was hemodynamically stable.  Workup in the ED showed WBC 26.7, hemoglobin 9.5, hematocrit 29.7, MCV 77.1, platelets 382.  BMP sodium 128, potassium 3.5, chloride 97, bicarb 21, blood glucose 118, BUN 16, creatinine 0.83, albumin 2.9, ammonia 12, lactic acid was normal, troponin x 2 was normal.  SARS coronavirus 2 was negative.  Blood culture pending.  CT chest abdomen and pelvis showed no acute findings within the chest, abdomen or pelvis.  No source of sepsis.  Marked increased colonic stool consistent with constipation.  No bowel obstruction or inflammation. CT head without contrast showed no acute intracranial abnormalities.  Chest x-ray showed patchy bibasilar consolidation, left greater than right, which could reflect infection or asymmetric edema.  Increased  pulmonary vascular congestion.  Patient was empirically started on ceftriaxone and azithromycin.  IV hydration was provided. Hospitalist was asked to admit patient for further evaluation and management.   Assessment and Plan:  Acute metabolic encephalopathy -Possibly secondary to urinary tract infection -There is concern that he may have recurrent infective endocarditis -Continue supportive measures -He has been started on ceftriaxone after blood cultures -Continue supportive measures as ordered  Fever/leukocytosis -concerning for reemergence of infective endocarditis -could be from UTI -continue IV ceftriaxone 2gm  -will ask for ID telehealth consult -pt missed outpatient follow up appt with Dr Luciana Axe  -recheck CBC/diff in AM   Hyponatremia -suspect from dehydration/poor oral intake -continue IV normal saline -recheck BMP in AM   Iron deficiency anemia  -continue daily iron supplement   Opioid Induced Constipation -dulcolax suppository ordered 9/15 -continue BID miralax treatment   Chronic Pain / Opioid Dependence - lidocaine patch - resume home morphine IR as needed  GAD - he is on alprazolam as needed   Dementia without behavior disturbance - delirium precautions  Essential hypertension  -carvedilol, losartan, add IV hydralazine PRN SBP>175    Hypokalemia - supplemental potassium ordered - recheck in AM   History of TAVR 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 Left ventricular ejection  fraction, by estimation, is 55 to 60%. The left ventricle has normal function. The left ventricle has no regional wall motion abnormalities.   2. Right ventricular systolic function is normal. The right ventricular size is normal.   3. Left atrial size was mildly dilated.   4. The mitral valve is degenerative. Mild to moderate mitral valve regurgitation. Mild mitral stenosis. The mean mitral valve gradient is 4.7 mmHg. Moderate mitral annular calcification.   5. The aortic valve is calcified. Aortic valve regurgitation is not visualized. Mild to moderate aortic valve stenosis. Aortic valve area, by VTI measures 1.77 cm. Aortic valve mean gradient measures 14.5 mmHg.  Aortic valve Vmax measures 2.63 m/s.   6. The inferior vena cava is normal in size with greater than 50% respiratory variability, suggesting right atrial pressure of 3 mmHg.   Antimicrobials:  Ceftriaxone 2 gm IV 9/14>>   Subjective: Pt very concerned about getting back on his home morphine pain medication  Objective: Vitals:   02/14/23 0737 02/14/23 0957 02/14/23 1330 02/14/23 1352  BP: 115/66 131/81 (!) 171/83 (!) 176/94  Pulse: 69 68 63 70  Resp:      Temp: 98.7 F (37.1 C)  98.6 F (37 C)   TempSrc: Oral  Oral   SpO2: 98% 100% 98%   Weight:      Height:        Intake/Output Summary (Last 24 hours) at 02/14/2023 1602 Last data filed at 02/14/2023 1332 Gross per 24 hour  Intake 832.94 ml  Output 550 ml  Net 282.94 ml   Filed Weights   02/13/23 1337 02/14/23 0519  Weight: 79.6 kg 81.9 kg   Examination:  General exam: Appears chronically ill, oriented x 3, calm and comfortable  Respiratory system: Clear to auscultation. Respiratory effort normal. Cardiovascular system: normal S1 & S2 heard. No JVD, No pedal edema. Gastrointestinal system: Abdomen is nondistended, soft and nontender. No organomegaly or masses felt. Normal bowel sounds heard. Central nervous system: Alert and oriented. No focal neurological  deficits. Extremities: Symmetric 5 x 5 power. Skin: No rashes, lesions or ulcers. Psychiatry: Judgement and insight appear normal. Mood & affect appropriate.   Data Reviewed: I have personally reviewed following labs and imaging studies  CBC: Recent Labs  Lab 02/13/23 1405 02/14/23 0453  WBC 26.7* 23.3*  NEUTROABS 25.1*  --   HGB 9.5* 9.2*  HCT 29.7* 29.1*  MCV 77.1* 77.6*  PLT 382 402*    Basic Metabolic Panel: Recent Labs  Lab 02/13/23 1405 02/14/23 0037 02/14/23 0453  NA 128* 132* 130*  K 3.5 3.3* 3.0*  CL 97* 98 97*  CO2 21* 24 23  GLUCOSE 118* 101* 133*  BUN 16 14 14   CREATININE 0.83 0.83 0.77  CALCIUM 8.6* 8.8* 8.7*  MG  --   --  1.9  PHOS  --   --  3.2    CBG: No results for input(s): "GLUCAP" in the last 168 hours.  Recent Results (from the past 240 hour(s))  Blood Culture (routine x 2)     Status: None (Preliminary result)   Collection Time: 02/13/23  2:05 PM   Specimen: BLOOD RIGHT ARM  Result Value Ref Range Status   Specimen Description   Final    BLOOD RIGHT ARM BOTTLES DRAWN AEROBIC AND ANAEROBIC   Special Requests Blood Culture adequate volume  Final   Culture   Final    NO  GROWTH < 24 HOURS Performed at [facility], [address], [place], [place] [address]    Report Status PENDING  Incomplete  Blood Culture (routine x 2)     Status: None (Preliminary result)   Collection Time: 02/13/23  2:35 PM   Specimen: BLOOD  Result Value Ref Range Status   Specimen Description BLOOD BLOOD LEFT HAND  Final   Special Requests NONE  Final   Culture   Final    NO GROWTH < 24 HOURS Performed at [facility], [address] [place], [place] [address]    Report Status PENDING  Incomplete  SARS Coronavirus 2 by RT PCR (hospital order, performed in [facility] hospital lab) *cepheid single result test* Anterior Nasal Swab     Status: None   Collection Time: 02/13/23  3:41 PM   Specimen: Anterior Nasal Swab  Result Value Ref Range Status   SARS  Coronavirus 2 by RT PCR NEGATIVE NEGATIVE Final    Comment: (NOTE) SARS-CoV-2 target nucleic acids are NOT DETECTED.  The SARS-CoV-2 RNA is generally detectable in upper and lower respiratory specimens during the acute phase of infection. The lowest concentration of SARS-CoV-2 viral copies this assay can detect is 250 copies / mL. A negative result does not preclude SARS-CoV-2 infection and should not be used as the sole basis for treatment or other patient management decisions.  A negative result may occur with improper specimen collection / handling, submission of specimen other than nasopharyngeal swab, presence of viral mutation(s) within the areas targeted by this assay, and inadequate number of viral copies (<250 copies / mL). A negative result must be combined with clinical observations, patient history, and epidemiological information.  Fact Sheet for Patients:   RoadLapTop.co.za  Fact Sheet for Healthcare Providers: http://kim-miller.com/  This test is not yet approved or  cleared by the [place] FDA and has been authorized for detection and/or diagnosis of SARS-CoV-2 by FDA under an Emergency Use Authorization (EUA).  This EUA will remain in effect (meaning this test can be used) for the duration of the COVID-19 declaration under Section 564(b)(1) of the Act, 21 U.S.C. section 360bbb-3(b)(1), unless the authorization is terminated or revoked sooner.  Performed at [facility], [address], [place], [place] [address]      Radiology Studies: ECHOCARDIOGRAM COMPLETE  Result Date: 02/14/2023    ECHOCARDIOGRAM REPORT   Patient Name:   [name] Date of Exam: 02/14/2023 Medical Rec #:  [id number]       Height:       73.5 in Accession #:    [id number]      Weight:       180.6 lb Date of Birth:  11-17-1950       BSA:          2.071 m Patient Age:    72 years        BP:           115/66 mmHg Patient Gender: M                HR:           65 bpm. [facility] Procedure: 2D Echo, Cardiac Doppler and Color Doppler Indications:    CHF  History:        Patient has prior history of Echocardiogram examinations, most                 recent 12/12/2022. COPD; Aortic Valve Disease.  Sonographer:  Darlys Gales Referring Phys: [id number] OLADAPO ADEFESO IMPRESSIONS  1. Left ventricular ejection fraction, by estimation, is 55 to 60%. The left ventricle has normal function. The left ventricle has no regional wall motion abnormalities.  2. Right ventricular systolic function is normal. The right ventricular size is normal.  3. Left atrial size was mildly dilated.  4. The mitral valve is degenerative. Mild to moderate mitral valve regurgitation. Mild mitral stenosis. The mean mitral valve gradient is 4.7 mmHg. Moderate mitral annular calcification.  5. The aortic valve is calcified. Aortic valve regurgitation is not visualized. Mild to moderate aortic valve stenosis. Aortic valve area, by VTI measures 1.77 cm. Aortic valve mean gradient measures 14.5 mmHg. Aortic valve Vmax measures 2.63 m/s.  6. The inferior vena cava is normal in size with greater than 50% respiratory variability, suggesting right atrial pressure of 3 mmHg. FINDINGS  Left Ventricle: Left ventricular ejection fraction, by estimation, is 55 to 60%. The left ventricle has normal function. The left ventricle has no regional wall motion abnormalities. The left ventricular internal cavity size was normal in size. There is  no left ventricular hypertrophy. Left ventricular diastolic function could not be evaluated due to mitral annular calcification (moderate or greater). Right Ventricle: The right ventricular size is normal. No increase in right ventricular wall thickness. Right ventricular systolic function is normal. Left Atrium: Left atrial size was mildly dilated. Right Atrium: Right atrial size was normal in size. Pericardium: There is no evidence of pericardial effusion.  Mitral Valve: The mitral valve is degenerative in appearance. Moderate mitral annular calcification. Mild to moderate mitral valve regurgitation. Mild mitral valve stenosis. MV peak gradient, 12.0 mmHg. The mean mitral valve gradient is 4.7 mmHg. Tricuspid Valve: The tricuspid valve is normal in structure. Tricuspid valve regurgitation is not demonstrated. No evidence of tricuspid stenosis. Aortic Valve: The aortic valve is calcified. Aortic valve regurgitation is not visualized. Mild to moderate aortic stenosis is present. Aortic valve mean gradient measures 14.5 mmHg. Aortic valve peak gradient measures 27.7 mmHg. Aortic valve area, by VTI measures 1.77 cm. There is a 29 mm Edwards bioprosthetic valve present in the aortic position. Pulmonic Valve: The pulmonic valve was not well visualized. Pulmonic valve regurgitation is not visualized. No evidence of pulmonic stenosis. Aorta: The aortic root is normal in size and structure. Venous: The inferior vena cava is normal in size with greater than 50% respiratory variability, suggesting right atrial pressure of 3 mmHg. IAS/Shunts: No atrial level shunt detected by color flow Doppler.  LEFT VENTRICLE PLAX 2D LVIDd:         5.20 cm   Diastology LVIDs:         3.30 cm   LV e' medial:    6.09 cm/s LV PW:         0.80 cm   LV E/e' medial:  17.4 LV IVS:        1.00 cm   LV e' lateral:   7.18 cm/s LVOT diam:     1.90 cm   LV E/e' lateral: 14.8 LV SV:         101 LV SV Index:   49 LVOT Area:     2.84 cm  RIGHT VENTRICLE RV S prime:     14.30 cm/s TAPSE (M-mode): 3.6 cm LEFT ATRIUM             Index        RIGHT ATRIUM           Index LA  Vol [facility]):   57.9 ml 27.96 ml/m  RA Area:     18.50 cm LA Vol (A4C):   74.3 ml 35.88 ml/m  RA Volume:   48.60 ml  23.47 ml/m LA Biplane Vol: 68.5 ml 33.08 ml/m  AORTIC VALVE AV Area (Vmax):    1.78 cm AV Area (Vmean):   1.77 cm AV Area (VTI):     1.77 cm AV Vmax:           263.00 cm/s AV Vmean:          178.000 cm/s AV VTI:             0.572 m AV Peak Grad:      27.7 mmHg AV Mean Grad:      14.5 mmHg LVOT Vmax:         165.00 cm/s LVOT Vmean:        111.000 cm/s LVOT VTI:          0.357 m LVOT/AV VTI ratio: 0.62 MITRAL VALVE MV Area (PHT): 1.48 cm     SHUNTS MV Area VTI:   1.54 cm     Systemic VTI:  0.36 m MV Peak grad:  12.0 mmHg    Systemic Diam: 1.90 cm MV Mean grad:  4.7 mmHg MV Vmax:       1.73 m/s MV Vmean:      102.3 cm/s MV Decel Time: 512 msec MV E velocity: 106.00 cm/s MV A velocity: 149.00 cm/s MV E/A ratio:  0.71 Kardie Tobb DO Electronically signed by Thomasene Ripple DO Signature Date/Time: 02/14/2023/12:05:37 PM    Final    CT CHEST ABDOMEN PELVIS WO CONTRAST  Result Date: 02/13/2023 CLINICAL DATA:  Sepsis"brought in by [facility] from home with c/o AMS. Wife reports he was "slower than normal". EMS reports he was alert but confused when they arrived. Normally A&O x 4. Vitals for EMS-Temp 101, HR 100, NSR, BP 101/67, ETCO2 20. Pt is alert to self, place, and situation, disoriented only to time upon arrival to ED. Pt denies pain. Wife reported to EMS that he has an "aortic infection" and is currently on 2 strong antibiotics, but EMS is unaware of which ones they are. " EXAM: CT CHEST, ABDOMEN AND PELVIS WITHOUT CONTRAST TECHNIQUE: Multidetector CT imaging of the chest, abdomen and pelvis was performed following the standard protocol without IV contrast. RADIATION DOSE REDUCTION: This exam was performed according to the departmental dose-optimization program which includes automated exposure control, adjustment of the mA and/or kV according to patient size and/or use of iterative reconstruction technique. COMPARISON:  12/09/2022. FINDINGS: CT CHEST FINDINGS Cardiovascular: Heart normal in size. No pericardial effusion. Left coronary artery calcifications. Stable aortic valve replacement. Great vessels normal in caliber. Aortic atherosclerotic calcifications. Mediastinum/Nodes: No neck base, mediastinal or hilar masses. No enlarged lymph  nodes. Trachea esophagus are unremarkable. Lungs/Pleura: Minimal dependent subsegmental atelectasis. No evidence of pneumonia or pulmonary edema. No lung mass. No suspicious nodule. No pleural effusion or pneumothorax. Musculoskeletal: No fracture or acute finding. No bone lesion. No chest wall mass. CT ABDOMEN PELVIS FINDINGS Hepatobiliary: Unremarkable liver. Status post cholecystectomy. No bile duct dilation. Pancreas: Pancreatic atrophy. No mass, duct dilation or inflammation. Spleen: Normal in size without focal abnormality. Adrenals/Urinary Tract: No adrenal mass. Kidneys normal in size, orientation and position. 12 mm low-attenuation renal mass, midpole of the right kidney, consistent with a cyst, no follow-up recommended. No other renal mass. No stone. No hydronephrosis. Ureters are normal in course and in caliber. Bladder  is unremarkable. Stomach/Bowel: Surgical vascular clips at the gastroesophageal junction, stable. Stomach mostly decompressed, otherwise unremarkable. Small bowel normal in caliber. Scattered small bowel air-fluid levels. No small bowel wall thickening or inflammation. Colon is distended with marked increased stool burden. Right colon measures up to 9.6 cm in diameter. No colonic wall thickening or inflammation. No evidence of obstruction. Stool moderately distends the rectum. Vascular/Lymphatic: Aortic atherosclerosis. No enlarged lymph nodes. Reproductive: Prostate normal in size. Other: No abdominal wall hernia or abnormality. No abdominopelvic ascites. Musculoskeletal: No acute fracture or acute finding. Mild depression of the upper endplate of L1 with associated Schmorl's nodes and sclerosis, stable from the prior exam. Stable degenerative changes. No aggressive bone lesion. IMPRESSION: 1. No acute findings within the chest, abdomen or pelvis. No source of sepsis. 2. Marked increased colonic stool consistent with constipation. No bowel obstruction or inflammation. 3. Multiple stable  chronic findings detailed above. Electronically Signed   By: Amie Portland M.D.   On: 02/13/2023 15:31   CT Head Wo Contrast  Result Date: 02/13/2023 CLINICAL DATA:  Mental status change"brought in by [facility] from home with c/o AMS. Wife reports he was "slower than normal". EMS reports he was alert but confused when they arrived. Normally A&O x 4. Vitals for EMS-Temp 101, HR 100, NSR, BP 101/67, ETCO2 20. Pt is alert to self, place, and situation, disoriented only to time upon arrival to ED. Pt denies pain. Wife reported to EMS that he has an "aortic infection" and is currently on 2 strong antibiotics, but EMS is unaware of which ones they are. " EXAM: CT HEAD WITHOUT CONTRAST TECHNIQUE: Contiguous axial images were obtained from the base of the skull through the vertex without intravenous contrast. RADIATION DOSE REDUCTION: This exam was performed according to the departmental dose-optimization program which includes automated exposure control, adjustment of the mA and/or kV according to patient size and/or use of iterative reconstruction technique. COMPARISON:  12/09/2022. FINDINGS: Brain: No evidence of acute infarction, hemorrhage, hydrocephalus, extra-axial collection or mass lesion/mass effect. Ventricle and sulcal enlargement reflecting moderate atrophy. Focus of hypoattenuation noted in posterior left frontal lobe consistent with an old infarct, stable. Vascular: No hyperdense vessel or unexpected calcification. Skull: Normal. Negative for fracture or focal lesion. Sinuses/Orbits: Globes and orbits are unremarkable. Moderate mucosal thickening lines the right frontal and bilateral ethmoids sinuses with mild mucosal thickening along the anterior sphenoid and bilateral maxillary sinuses. Other: None. IMPRESSION: 1. No acute intracranial abnormalities. 2. Stable intracranial appearance from the prior head CT. Electronically Signed   By: Amie Portland M.D.   On: 02/13/2023 15:21   DG Chest Port 1  View  Result Date: 02/13/2023 CLINICAL DATA:  Sepsis EXAM: PORTABLE CHEST 1 VIEW COMPARISON:  12/11/2022 FINDINGS: Single frontal view of the chest demonstrates a stable cardiac silhouette. Aortic valve prosthesis again noted. Pulmonary vascular congestion, with patchy bibasilar consolidation left greater than right. No effusion or pneumothorax. No acute bony abnormalities. IMPRESSION: 1. Patchy bibasilar consolidation, left greater than right, which could reflect infection or asymmetric edema. 2. Increased pulmonary vascular congestion. Electronically Signed   By: Sharlet Salina M.D.   On: 02/13/2023 14:14    Scheduled Meds:  apixaban  5 mg Oral BID   bisacodyl  10 mg Rectal Once   carvedilol  6.25 mg Oral BID WC   [START ON 02/15/2023] cholecalciferol  5,000 Units Oral Daily   ferrous sulfate  325 mg Oral Daily   levothyroxine  25 mcg Oral Q0600   losartan  25 mg Oral Daily   mometasone-formoterol  2 puff Inhalation BID   multivitamin with minerals  1 tablet Oral QHS   polyethylene glycol  17 g Oral Daily   potassium chloride  40 mEq Oral BID   rosuvastatin  40 mg Oral Daily   sertraline  150 mg Oral QHS   Continuous Infusions:  sodium chloride 50 mL/hr at 02/14/23 1157   azithromycin 500 mg (02/14/23 1329)   cefTRIAXone (ROCEPHIN)  IV      LOS: 1 day   Time spent: 60 mins  Khaliel Morey Laural Benes, MD How to contact the [facility] Attending or Consulting provider 7A - 7P or covering provider during after hours 7P -7A, for this patient?  Check the care team in [facility] and look for a) attending/consulting [facility] provider listed and b) the [facility] team listed Log into [email or web address] and use [place]'s universal password to access. If you do not have the password, please contact the hospital operator. Locate the [facility] provider you are looking for under [facility] and page to a number that you can be directly reached. If you still have difficulty reaching the provider, please page the [facility] (Director on  Call) for the Hospitalists listed on amion for assistance.  02/14/2023, 4:02 PM

## 2023-02-14 NOTE — Hospital Course (Signed)
72 y.o. male with medical history significant of hypertension, atrial fibrillation on anticoagulation, TAVR, COPD, GERD, mild dementia, prior CVA x 2 who presents to the emergency department from home via EMS due to altered mental status which started yesterday. 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  Patient complained of subjective fever yesterday, wife activated EMS due to altered mental status was described as " slower than normal".  On arrival of EMS team, patient was said to have fever of 101F and was taken to the ED for further evaluation and management.  Patient was reported to be confused on arrival to the ED. he denies chest pain, shortness of breath, vomiting, diarrhea, abdominal pain.   ED Course:  In the emergency department, he was hemodynamically stable.  Workup in the ED showed WBC 26.7, hemoglobin 9.5, hematocrit 29.7, MCV 77.1, platelets 382.  BMP sodium 128, potassium 3.5, chloride 97, bicarb 21, blood glucose 118, BUN 16, creatinine 0.83, albumin 2.9, ammonia 12, lactic acid was normal, troponin x 2 was normal.  SARS coronavirus 2 was negative.  Blood culture pending.  CT chest abdomen and pelvis showed no acute findings within the chest, abdomen or pelvis.  No source of sepsis.  Marked increased colonic stool consistent with constipation.  No bowel obstruction or inflammation. CT head without contrast showed no acute intracranial abnormalities.  Chest x-ray showed patchy bibasilar consolidation, left greater than right, which could reflect infection or asymmetric edema.  Increased pulmonary vascular congestion.  Patient was empirically started on ceftriaxone and azithromycin.  IV hydration was provided. Hospitalist was asked to admit patient for further evaluation and  management.

## 2023-02-14 NOTE — Plan of Care (Signed)

## 2023-02-14 NOTE — TOC Initial Note (Signed)
Transition of Care [facility]) - Initial/Assessment Note    Patient Details  Name: [name] MRN: [id number] Date of Birth: 1950/07/30  Transition of Care [facility]) CM/SW Contact:    Villa Herb, LCSWA Phone Number: 02/14/2023, 11:03 AM  Clinical Narrative:                 Pt is high risk for readmission. CSW spoke with pts wife to complete assessment. Pt recently went to SNF at [facility]. Pts wife assists with ADLs as able. Pts wife provides pt transportation. Pt has HH PT currently. Pt has a cane, walker and wheelchair in the home. TOC to follow.   Expected Discharge Plan: Home w Home Health Services Barriers to Discharge: Continued Medical Work up   Patient Goals and CMS Choice Patient states their goals for this hospitalization and ongoing recovery are:: return home CMS Medicare.gov Compare Post Acute Care list provided to:: Patient Represenative (must comment) Choice offered to / list presented to : Patient, Spouse      Expected Discharge Plan and Services In-house Referral: Clinical Social Work Discharge Planning Services: CM Consult   Living arrangements for the past 2 months: Single Family Home                                      Prior Living Arrangements/Services Living arrangements for the past 2 months: Single Family Home Lives with:: Spouse Patient language and need for interpreter reviewed:: Yes Do you feel safe going back to the place where you live?: Yes      Need for Family Participation in Patient Care: Yes (Comment) Care giver support system in place?: Yes (comment) Current home services: DME Criminal Activity/Legal Involvement Pertinent to Current Situation/Hospitalization: No - Comment as needed  Activities of Daily Living Home Assistive Devices/Equipment: Walker (specify type) ADL Screening (condition at time of admission) Patient's cognitive ability adequate to safely complete daily activities?: Yes Is the patient deaf or have difficulty  hearing?: Yes (Has hearing aids at home but had no trouble with listening and answering questions with this RN) Does the patient have difficulty seeing, even when wearing glasses/contacts?: No Does the patient have difficulty concentrating, remembering, or making decisions?: No Patient able to express need for assistance with ADLs?: Yes Does the patient have difficulty dressing or bathing?: Yes (Pt reports his wife helps him at home sometimes with bathing) Independently performs ADLs?: No Communication: Independent Dressing (OT): Independent Grooming: Independent Feeding: Independent Bathing: Needs assistance Is this a change from baseline?: Pre-admission baseline Toileting: Needs assistance Is this a change from baseline?: Pre-admission baseline In/Out Bed: Needs assistance Is this a change from baseline?: Pre-admission baseline Walks in Home: Independent with device (comment) Dan Humphreys) Does the patient have difficulty walking or climbing stairs?: Yes Weakness of Legs: Both Weakness of Arms/Hands: None  Permission Sought/Granted                  Emotional Assessment Appearance:: Appears stated age       Alcohol / Substance Use: Not Applicable Psych Involvement: No (comment)  Admission diagnosis:  Acute cystitis without hematuria [N30.00] H/O endocarditis [Z86.79] Altered mental status, unspecified altered mental status type [R41.82] Acute metabolic encephalopathy [G93.41] Sepsis, due to unspecified organism, unspecified whether acute organ dysfunction present [facility]) [A41.9] Patient Active Problem List   Diagnosis Date Noted   Acute metabolic encephalopathy 02/13/2023   Hypoalbuminemia due to protein-calorie malnutrition (HCC)  02/13/2023   Generalized weakness 02/13/2023   Acute bacterial endocarditis 12/31/2022   Hypokalemia 12/30/2022   Acquired hypothyroidism 12/30/2022   Chronic pain syndrome 12/30/2022   Dementia without behavioral disturbance (HCC) 12/30/2022    Endocarditis 12/29/2022   Physical deconditioning 12/29/2022   Aortic valve endocarditis 12/11/2022   Fever of undetermined origin 12/10/2022   Fall at home, initial encounter 12/10/2022   Hyponatremia 12/10/2022   Elevated brain natriuretic peptide (BNP) level 12/10/2022   Microcytic anemia 12/10/2022   Essential hypertension 12/10/2022   SIRS (systemic inflammatory response syndrome) (HCC) 12/10/2022   Fever of unknown origin 12/10/2022   Senile purpura (HCC) 02/08/2022   S/P TAVR (transcatheter aortic valve replacement) 08/08/2019   Edema leg from Venous Stasis    Severe aortic stenosis    Chronic post-traumatic stress disorder (PTSD) 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 06/29/2019   Chronic sinusitis 11/02/2017   Persistent atrial fibrillation (HCC) 07/07/2017   Allergy to intravenous contrast 10/15/2016   COPD mixed type (HCC) 07/08/2016   Bilateral lower extremity edema 02/26/2013   Back pain, chronic, followed at pain clinic 11/25/2011   Incisional hernia 09/21/2011   Umbilical hernia 05/07/2011   Lung nodule 01/22/2009   Constipation 07/31/2008   Diabetes mellitus type 2, controlled, with complications (HCC) 01/16/2008   NEPHROLITHIASIS, HX OF 07/22/2007   Mixed hyperlipidemia 01/21/2007   Other idiopathic peripheral autonomic neuropathy (HCC) 01/21/2007   Hypertensive heart disease with chronic diastolic congestive heart failure (HCC) 01/21/2007   Allergic rhinitis 01/21/2007   GERD 01/21/2007   DIVERTICULOSIS, COLON 01/21/2007   History of stroke 01/21/2007   ESOPHAGEAL STRICTURE 02/02/2006   PCP:  [name], MD Pharmacy:   [facility] - [place], [place] - [address] [place] [address] Phone: [phone] Fax: [phone]  [facility] [address] [place] [address] Phone: [phone] Fax: [phone]     Social Determinants of Health (SDOH) Social History: SDOH Screenings   Food  Insecurity: No Food Insecurity (02/13/2023)  Housing: Low Risk  (02/13/2023)  Transportation Needs: No Transportation Needs (02/13/2023)  Utilities: Not At Risk (02/13/2023)  Tobacco Use: Medium Risk (12/29/2022)   SDOH Interventions:     Readmission Risk Interventions    02/14/2023   11:03 AM  Readmission Risk Prevention Plan  Transportation Screening Complete  PCP or Specialist appointment within 3-5 days of discharge Complete  HRI or Home Care Consult Complete  SW Recovery Care/Counseling Consult Complete  Palliative Care Screening Not Applicable  Skilled Nursing Facility Not Applicable

## 2023-02-15 ENCOUNTER — Ambulatory Visit: Payer: Medicare HMO | Admitting: Cardiology

## 2023-02-15 DIAGNOSIS — I10 Essential (primary) hypertension: Secondary | ICD-10-CM | POA: Diagnosis not present

## 2023-02-15 DIAGNOSIS — I4819 Other persistent atrial fibrillation: Secondary | ICD-10-CM | POA: Diagnosis not present

## 2023-02-15 DIAGNOSIS — G9341 Metabolic encephalopathy: Secondary | ICD-10-CM | POA: Diagnosis not present

## 2023-02-15 DIAGNOSIS — E871 Hypo-osmolality and hyponatremia: Secondary | ICD-10-CM | POA: Diagnosis not present

## 2023-02-15 LAB — CBC WITH DIFFERENTIAL/PLATELET
Abs Immature Granulocytes: 0.09 10*3/uL — ABNORMAL HIGH (ref 0.00–0.07)
Basophils Absolute: 0 10*3/uL (ref 0.0–0.1)
Basophils Relative: 0 %
Eosinophils Absolute: 0.3 10*3/uL (ref 0.0–0.5)
Eosinophils Relative: 2 %
HCT: 29.6 % — ABNORMAL LOW (ref 39.0–52.0)
Hemoglobin: 9.1 g/dL — ABNORMAL LOW (ref 13.0–17.0)
Immature Granulocytes: 1 %
Lymphocytes Relative: 11 %
Lymphs Abs: 1.7 10*3/uL (ref 0.7–4.0)
MCH: 24.5 pg — ABNORMAL LOW (ref 26.0–34.0)
MCHC: 30.7 g/dL (ref 30.0–36.0)
MCV: 79.8 fL — ABNORMAL LOW (ref 80.0–100.0)
Monocytes Absolute: 0.9 10*3/uL (ref 0.1–1.0)
Monocytes Relative: 6 %
Neutro Abs: 12.4 10*3/uL — ABNORMAL HIGH (ref 1.7–7.7)
Neutrophils Relative %: 80 %
Platelets: 399 10*3/uL (ref 150–400)
RBC: 3.71 MIL/uL — ABNORMAL LOW (ref 4.22–5.81)
RDW: 16.9 % — ABNORMAL HIGH (ref 11.5–15.5)
WBC: 15.5 10*3/uL — ABNORMAL HIGH (ref 4.0–10.5)
nRBC: 0 % (ref 0.0–0.2)

## 2023-02-15 LAB — BASIC METABOLIC PANEL
Anion gap: 10 (ref 5–15)
BUN: 12 mg/dL (ref 8–23)
CO2: 23 mmol/L (ref 22–32)
Calcium: 8.7 mg/dL — ABNORMAL LOW (ref 8.9–10.3)
Chloride: 101 mmol/L (ref 98–111)
Creatinine, Ser: 0.77 mg/dL (ref 0.61–1.24)
GFR, Estimated: >60 mL/min (ref >60)
Glucose, Bld: 94 mg/dL (ref 70–99)
Potassium: 4.2 mmol/L (ref 3.5–5.1)
Sodium: 134 mmol/L — ABNORMAL LOW (ref 135–145)

## 2023-02-15 LAB — URINE CULTURE: Culture: >=100000 — AB

## 2023-02-15 LAB — GLUCOSE, CAPILLARY: Glucose-Capillary: 100 mg/dL — ABNORMAL HIGH (ref 70–99)

## 2023-02-15 MED ORDER — SODIUM CHLORIDE 0.9 % IV SOLN
8.0000 mg | Freq: Once | INTRAVENOUS | Status: AC
  Administered 2023-02-15: 8 mg via INTRAVENOUS
  Filled 2023-02-15: qty 4

## 2023-02-15 MED ORDER — SODIUM CHLORIDE 0.9 % IV SOLN
1.0000 g | Freq: Three times a day (TID) | INTRAVENOUS | Status: DC
  Administered 2023-02-15: 1 g via INTRAVENOUS
  Filled 2023-02-15: qty 20

## 2023-02-15 MED ORDER — ONDANSETRON HCL 4 MG/2ML IJ SOLN
INTRAMUSCULAR | Status: AC
  Filled 2023-02-15: qty 4

## 2023-02-15 MED ORDER — SULFAMETHOXAZOLE-TRIMETHOPRIM 800-160 MG PO TABS
1.0000 | ORAL_TABLET | Freq: Two times a day (BID) | ORAL | Status: AC
  Administered 2023-02-15 – 2023-02-17 (×5): 1 via ORAL
  Filled 2023-02-15 (×5): qty 1

## 2023-02-15 MED ORDER — SENNOSIDES-DOCUSATE SODIUM 8.6-50 MG PO TABS: 2.0000 | ORAL_TABLET | Freq: Every day | ORAL | Status: DC

## 2023-02-15 NOTE — Progress Notes (Addendum)
PROGRESS NOTE   [name]  UJW:[id number] DOB: 08/18/1950 DOA: 02/13/2023 PCP: [name], MD   No chief complaint on file.  Level of care: Med-Surg  Brief Admission History:  72 y.o. male with medical history significant of hypertension, atrial fibrillation on anticoagulation, TAVR, COPD, GERD, mild dementia, prior CVA x 2 who presents to the emergency department from home via EMS due to altered mental status which started yesterday. 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  Patient complained of subjective fever yesterday, wife activated EMS due to altered mental status was described as " slower than normal".  On arrival of EMS team, patient was said to have fever of 101F and was taken to the ED for further evaluation and management.  Patient was reported to be confused on arrival to the ED. he denies chest pain, shortness of breath, vomiting, diarrhea, abdominal pain.   ED Course:  In the emergency department, he was hemodynamically stable.  Workup in the ED showed WBC 26.7, hemoglobin 9.5, hematocrit 29.7, MCV 77.1, platelets 382.  BMP sodium 128, potassium 3.5, chloride 97, bicarb 21, blood glucose 118, BUN 16, creatinine 0.83, albumin 2.9, ammonia 12, lactic acid was normal, troponin x 2 was normal.  SARS coronavirus 2 was negative.  Blood culture pending.  CT chest abdomen and pelvis showed no acute findings within the chest, abdomen or pelvis.  No source of sepsis.  Marked increased colonic stool consistent with constipation.  No bowel obstruction or inflammation. CT head without contrast showed no acute intracranial abnormalities.  Chest x-ray showed patchy bibasilar consolidation, left greater than right, which could reflect infection or asymmetric edema.  Increased  pulmonary vascular congestion.  Patient was empirically started on ceftriaxone and azithromycin.  IV hydration was provided. Hospitalist was asked to admit patient for further evaluation and management.   Assessment and Plan:  Acute metabolic encephalopathy - improving -secondary to urinary tract infection -Continue supportive measures  ESBL E Coli UTI  -presented with Fever/leukocytosis/malaise/AMS -there was initial concern for reemergence of infective endocarditis but given his rapid response to treatment for UTI and given he was fully treated for endocarditis we will see how he responds to UTI treatment -IV meropenem ordered for ESBL treatment  -WBC trending down  -follow blood cultures: no growth to date   Hyponatremia - improving  -suspect from dehydration/poor oral intake -continue IV normal saline -recheck BMP in AM   Iron deficiency anemia  -continue daily iron supplement   Opioid Induced Constipation -dulcolax suppository ordered 9/15 -continue BID miralax treatment  -add nightly senna treatment  Chronic Pain / Opioid Dependence - lidocaine patch - resumed home morphine IR as needed  GAD - he is on alprazolam as needed   Dementia without behavior disturbance - delirium precautions  Essential hypertension  -carvedilol, losartan, add IV hydralazine PRN SBP>175    Hypokalemia - supplemental potassium ordered and repleted  - recheck in AM   History of TAVR 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  CEFTRIAXONE >=64 RESISTANT Resistant     CIPROFLOXACIN >=4 RESISTANT Resistant     GENTAMICIN <=1 SENSITIVE Sensitive     IMIPENEM <=0.25 SENSITIVE Sensitive     NITROFURANTOIN 128 RESISTANT Resistant     TRIMETH/SULFA <=20 SENSITIVE Sensitive     AMPICILLIN/SULBACTAM >=32 RESISTANT Resistant     PIP/TAZO 8 SENSITIVE Sensitive     * >=100,000 COLONIES/mL ESCHERICHIA COLI     Radiology Studies: ECHOCARDIOGRAM COMPLETE  Result Date: 02/14/2023    ECHOCARDIOGRAM REPORT   Patient Name:   [name] Date of Exam: 02/14/2023 Medical Rec #:  [id number]       Height:       73.5 in Accession #:    [id number]      Weight:       180.6 lb Date of Birth:  [birth year]       BSA:          2.071 m Patient Age:    72 years        BP:           115/66 mmHg Patient Gender: M               HR:           65 bpm. [facility] Procedure: 2D Echo, Cardiac Doppler and Color Doppler Indications:    CHF  History:        Patient has prior history of Echocardiogram examinations, most                 recent 12/12/2022. COPD; Aortic Valve Disease.  Sonographer:    Darlys Gales Referring Phys: [id number] OLADAPO ADEFESO IMPRESSIONS  1. Left ventricular ejection fraction, by estimation, is 55 to 60%. The left ventricle has normal function. The left ventricle has no regional wall motion abnormalities.  2.  Right ventricular systolic function is normal. The right ventricular size is normal.  3. Left atrial size was mildly dilated.  4. The mitral valve is degenerative. Mild to moderate mitral valve regurgitation. Mild mitral stenosis. The mean mitral valve gradient is 4.7 mmHg. Moderate mitral annular calcification.  5. The aortic valve is calcified. Aortic valve regurgitation is not visualized. Mild to moderate aortic valve stenosis. Aortic valve area, by VTI measures 1.77 cm. Aortic valve mean gradient measures 14.5 mmHg. Aortic valve Vmax measures 2.63 m/s.  6. The inferior vena cava is normal in size with greater than 50% respiratory variability, suggesting right atrial pressure of 3 mmHg. FINDINGS  Left Ventricle: Left ventricular ejection fraction, by estimation, is 55 to 60%. The left ventricle has normal function. The left ventricle has no regional wall motion abnormalities. The left ventricular internal cavity size was normal in size. There is  no left ventricular hypertrophy. Left ventricular diastolic function could not be evaluated due to mitral annular calcification (moderate or greater). Right Ventricle: The right ventricular size is normal. No increase in right ventricular wall thickness. Right ventricular systolic function is normal. Left Atrium: Left atrial size was mildly dilated. Right Atrium: Right atrial size was normal in size. Pericardium: There is no evidence of pericardial effusion. Mitral Valve: The mitral valve is degenerative in appearance. Moderate mitral annular calcification. Mild to moderate mitral valve regurgitation. Mild mitral valve stenosis. MV peak gradient, 12.0 mmHg. The mean mitral valve gradient is 4.7 mmHg. Tricuspid Valve: The tricuspid valve is normal in structure. Tricuspid valve regurgitation is not demonstrated. No evidence of tricuspid stenosis. Aortic Valve: The aortic valve is  CEFTRIAXONE >=64 RESISTANT Resistant     CIPROFLOXACIN >=4 RESISTANT Resistant     GENTAMICIN <=1 SENSITIVE Sensitive     IMIPENEM <=0.25 SENSITIVE Sensitive     NITROFURANTOIN 128 RESISTANT Resistant     TRIMETH/SULFA <=20 SENSITIVE Sensitive     AMPICILLIN/SULBACTAM >=32 RESISTANT Resistant     PIP/TAZO 8 SENSITIVE Sensitive     * >=100,000 COLONIES/mL ESCHERICHIA COLI     Radiology Studies: ECHOCARDIOGRAM COMPLETE  Result Date: 02/14/2023    ECHOCARDIOGRAM REPORT   Patient Name:   [name] Date of Exam: 02/14/2023 Medical Rec #:  [id number]       Height:       73.5 in Accession #:    [id number]      Weight:       180.6 lb Date of Birth:  [birth year]       BSA:          2.071 m Patient Age:    72 years        BP:           115/66 mmHg Patient Gender: M               HR:           65 bpm. [facility] Procedure: 2D Echo, Cardiac Doppler and Color Doppler Indications:    CHF  History:        Patient has prior history of Echocardiogram examinations, most                 recent 12/12/2022. COPD; Aortic Valve Disease.  Sonographer:    Darlys Gales Referring Phys: [id number] OLADAPO ADEFESO IMPRESSIONS  1. Left ventricular ejection fraction, by estimation, is 55 to 60%. The left ventricle has normal function. The left ventricle has no regional wall motion abnormalities.  2.  Right ventricular systolic function is normal. The right ventricular size is normal.  3. Left atrial size was mildly dilated.  4. The mitral valve is degenerative. Mild to moderate mitral valve regurgitation. Mild mitral stenosis. The mean mitral valve gradient is 4.7 mmHg. Moderate mitral annular calcification.  5. The aortic valve is calcified. Aortic valve regurgitation is not visualized. Mild to moderate aortic valve stenosis. Aortic valve area, by VTI measures 1.77 cm. Aortic valve mean gradient measures 14.5 mmHg. Aortic valve Vmax measures 2.63 m/s.  6. The inferior vena cava is normal in size with greater than 50% respiratory variability, suggesting right atrial pressure of 3 mmHg. FINDINGS  Left Ventricle: Left ventricular ejection fraction, by estimation, is 55 to 60%. The left ventricle has normal function. The left ventricle has no regional wall motion abnormalities. The left ventricular internal cavity size was normal in size. There is  no left ventricular hypertrophy. Left ventricular diastolic function could not be evaluated due to mitral annular calcification (moderate or greater). Right Ventricle: The right ventricular size is normal. No increase in right ventricular wall thickness. Right ventricular systolic function is normal. Left Atrium: Left atrial size was mildly dilated. Right Atrium: Right atrial size was normal in size. Pericardium: There is no evidence of pericardial effusion. Mitral Valve: The mitral valve is degenerative in appearance. Moderate mitral annular calcification. Mild to moderate mitral valve regurgitation. Mild mitral valve stenosis. MV peak gradient, 12.0 mmHg. The mean mitral valve gradient is 4.7 mmHg. Tricuspid Valve: The tricuspid valve is normal in structure. Tricuspid valve regurgitation is not demonstrated. No evidence of tricuspid stenosis. Aortic Valve: The aortic valve is  PROGRESS NOTE   [name]  UJW:[id number] DOB: 08/18/1950 DOA: 02/13/2023 PCP: [name], MD   No chief complaint on file.  Level of care: Med-Surg  Brief Admission History:  72 y.o. male with medical history significant of hypertension, atrial fibrillation on anticoagulation, TAVR, COPD, GERD, mild dementia, prior CVA x 2 who presents to the emergency department from home via EMS due to altered mental status which started yesterday. 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  Patient complained of subjective fever yesterday, wife activated EMS due to altered mental status was described as " slower than normal".  On arrival of EMS team, patient was said to have fever of 101F and was taken to the ED for further evaluation and management.  Patient was reported to be confused on arrival to the ED. he denies chest pain, shortness of breath, vomiting, diarrhea, abdominal pain.   ED Course:  In the emergency department, he was hemodynamically stable.  Workup in the ED showed WBC 26.7, hemoglobin 9.5, hematocrit 29.7, MCV 77.1, platelets 382.  BMP sodium 128, potassium 3.5, chloride 97, bicarb 21, blood glucose 118, BUN 16, creatinine 0.83, albumin 2.9, ammonia 12, lactic acid was normal, troponin x 2 was normal.  SARS coronavirus 2 was negative.  Blood culture pending.  CT chest abdomen and pelvis showed no acute findings within the chest, abdomen or pelvis.  No source of sepsis.  Marked increased colonic stool consistent with constipation.  No bowel obstruction or inflammation. CT head without contrast showed no acute intracranial abnormalities.  Chest x-ray showed patchy bibasilar consolidation, left greater than right, which could reflect infection or asymmetric edema.  Increased  pulmonary vascular congestion.  Patient was empirically started on ceftriaxone and azithromycin.  IV hydration was provided. Hospitalist was asked to admit patient for further evaluation and management.   Assessment and Plan:  Acute metabolic encephalopathy - improving -secondary to urinary tract infection -Continue supportive measures  ESBL E Coli UTI  -presented with Fever/leukocytosis/malaise/AMS -there was initial concern for reemergence of infective endocarditis but given his rapid response to treatment for UTI and given he was fully treated for endocarditis we will see how he responds to UTI treatment -IV meropenem ordered for ESBL treatment  -WBC trending down  -follow blood cultures: no growth to date   Hyponatremia - improving  -suspect from dehydration/poor oral intake -continue IV normal saline -recheck BMP in AM   Iron deficiency anemia  -continue daily iron supplement   Opioid Induced Constipation -dulcolax suppository ordered 9/15 -continue BID miralax treatment  -add nightly senna treatment  Chronic Pain / Opioid Dependence - lidocaine patch - resumed home morphine IR as needed  GAD - he is on alprazolam as needed   Dementia without behavior disturbance - delirium precautions  Essential hypertension  -carvedilol, losartan, add IV hydralazine PRN SBP>175    Hypokalemia - supplemental potassium ordered and repleted  - recheck in AM   History of TAVR 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  CEFTRIAXONE >=64 RESISTANT Resistant     CIPROFLOXACIN >=4 RESISTANT Resistant     GENTAMICIN <=1 SENSITIVE Sensitive     IMIPENEM <=0.25 SENSITIVE Sensitive     NITROFURANTOIN 128 RESISTANT Resistant     TRIMETH/SULFA <=20 SENSITIVE Sensitive     AMPICILLIN/SULBACTAM >=32 RESISTANT Resistant     PIP/TAZO 8 SENSITIVE Sensitive     * >=100,000 COLONIES/mL ESCHERICHIA COLI     Radiology Studies: ECHOCARDIOGRAM COMPLETE  Result Date: 02/14/2023    ECHOCARDIOGRAM REPORT   Patient Name:   [name] Date of Exam: 02/14/2023 Medical Rec #:  [id number]       Height:       73.5 in Accession #:    [id number]      Weight:       180.6 lb Date of Birth:  [birth year]       BSA:          2.071 m Patient Age:    72 years        BP:           115/66 mmHg Patient Gender: M               HR:           65 bpm. [facility] Location[facility] Procedure: 2D Echo, Cardiac Doppler and Color Doppler Indications:    CHF  History:        Patient has prior history of Echocardiogram examinations, most                 recent 12/12/2022. COPD; Aortic Valve Disease.  Sonographer:    Darlys Gales Referring Phys: [id number] OLADAPO ADEFESO IMPRESSIONS  1. Left ventricular ejection fraction, by estimation, is 55 to 60%. The left ventricle has normal function. The left ventricle has no regional wall motion abnormalities.  2.  Right ventricular systolic function is normal. The right ventricular size is normal.  3. Left atrial size was mildly dilated.  4. The mitral valve is degenerative. Mild to moderate mitral valve regurgitation. Mild mitral stenosis. The mean mitral valve gradient is 4.7 mmHg. Moderate mitral annular calcification.  5. The aortic valve is calcified. Aortic valve regurgitation is not visualized. Mild to moderate aortic valve stenosis. Aortic valve area, by VTI measures 1.77 cm. Aortic valve mean gradient measures 14.5 mmHg. Aortic valve Vmax measures 2.63 m/s.  6. The inferior vena cava is normal in size with greater than 50% respiratory variability, suggesting right atrial pressure of 3 mmHg. FINDINGS  Left Ventricle: Left ventricular ejection fraction, by estimation, is 55 to 60%. The left ventricle has normal function. The left ventricle has no regional wall motion abnormalities. The left ventricular internal cavity size was normal in size. There is  no left ventricular hypertrophy. Left ventricular diastolic function could not be evaluated due to mitral annular calcification (moderate or greater). Right Ventricle: The right ventricular size is normal. No increase in right ventricular wall thickness. Right ventricular systolic function is normal. Left Atrium: Left atrial size was mildly dilated. Right Atrium: Right atrial size was normal in size. Pericardium: There is no evidence of pericardial effusion. Mitral Valve: The mitral valve is degenerative in appearance. Moderate mitral annular calcification. Mild to moderate mitral valve regurgitation. Mild mitral valve stenosis. MV peak gradient, 12.0 mmHg. The mean mitral valve gradient is 4.7 mmHg. Tricuspid Valve: The tricuspid valve is normal in structure. Tricuspid valve regurgitation is not demonstrated. No evidence of tricuspid stenosis. Aortic Valve: The aortic valve is  CEFTRIAXONE >=64 RESISTANT Resistant     CIPROFLOXACIN >=4 RESISTANT Resistant     GENTAMICIN <=1 SENSITIVE Sensitive     IMIPENEM <=0.25 SENSITIVE Sensitive     NITROFURANTOIN 128 RESISTANT Resistant     TRIMETH/SULFA <=20 SENSITIVE Sensitive     AMPICILLIN/SULBACTAM >=32 RESISTANT Resistant     PIP/TAZO 8 SENSITIVE Sensitive     * >=100,000 COLONIES/mL ESCHERICHIA COLI     Radiology Studies: ECHOCARDIOGRAM COMPLETE  Result Date: 02/14/2023    ECHOCARDIOGRAM REPORT   Patient Name:   [name] Date of Exam: 02/14/2023 Medical Rec #:  [id number]       Height:       73.5 in Accession #:    [id number]      Weight:       180.6 lb Date of Birth:  [birth year]       BSA:          2.071 m Patient Age:    72 years        BP:           115/66 mmHg Patient Gender: M               HR:           65 bpm. [facility] Procedure: 2D Echo, Cardiac Doppler and Color Doppler Indications:    CHF  History:        Patient has prior history of Echocardiogram examinations, most                 recent 12/12/2022. COPD; Aortic Valve Disease.  Sonographer:    Darlys Gales Referring Phys: [id number] OLADAPO ADEFESO IMPRESSIONS  1. Left ventricular ejection fraction, by estimation, is 55 to 60%. The left ventricle has normal function. The left ventricle has no regional wall motion abnormalities.  2.  Right ventricular systolic function is normal. The right ventricular size is normal.  3. Left atrial size was mildly dilated.  4. The mitral valve is degenerative. Mild to moderate mitral valve regurgitation. Mild mitral stenosis. The mean mitral valve gradient is 4.7 mmHg. Moderate mitral annular calcification.  5. The aortic valve is calcified. Aortic valve regurgitation is not visualized. Mild to moderate aortic valve stenosis. Aortic valve area, by VTI measures 1.77 cm. Aortic valve mean gradient measures 14.5 mmHg. Aortic valve Vmax measures 2.63 m/s.  6. The inferior vena cava is normal in size with greater than 50% respiratory variability, suggesting right atrial pressure of 3 mmHg. FINDINGS  Left Ventricle: Left ventricular ejection fraction, by estimation, is 55 to 60%. The left ventricle has normal function. The left ventricle has no regional wall motion abnormalities. The left ventricular internal cavity size was normal in size. There is  no left ventricular hypertrophy. Left ventricular diastolic function could not be evaluated due to mitral annular calcification (moderate or greater). Right Ventricle: The right ventricular size is normal. No increase in right ventricular wall thickness. Right ventricular systolic function is normal. Left Atrium: Left atrial size was mildly dilated. Right Atrium: Right atrial size was normal in size. Pericardium: There is no evidence of pericardial effusion. Mitral Valve: The mitral valve is degenerative in appearance. Moderate mitral annular calcification. Mild to moderate mitral valve regurgitation. Mild mitral valve stenosis. MV peak gradient, 12.0 mmHg. The mean mitral valve gradient is 4.7 mmHg. Tricuspid Valve: The tricuspid valve is normal in structure. Tricuspid valve regurgitation is not demonstrated. No evidence of tricuspid stenosis. Aortic Valve: The aortic valve is  PROGRESS NOTE   [name]  UJW:[id number] DOB: 08/18/1950 DOA: 02/13/2023 PCP: [name], MD   No chief complaint on file.  Level of care: Med-Surg  Brief Admission History:  72 y.o. male with medical history significant of hypertension, atrial fibrillation on anticoagulation, TAVR, COPD, GERD, mild dementia, prior CVA x 2 who presents to the emergency department from home via EMS due to altered mental status which started yesterday. 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  Patient complained of subjective fever yesterday, wife activated EMS due to altered mental status was described as " slower than normal".  On arrival of EMS team, patient was said to have fever of 101F and was taken to the ED for further evaluation and management.  Patient was reported to be confused on arrival to the ED. he denies chest pain, shortness of breath, vomiting, diarrhea, abdominal pain.   ED Course:  In the emergency department, he was hemodynamically stable.  Workup in the ED showed WBC 26.7, hemoglobin 9.5, hematocrit 29.7, MCV 77.1, platelets 382.  BMP sodium 128, potassium 3.5, chloride 97, bicarb 21, blood glucose 118, BUN 16, creatinine 0.83, albumin 2.9, ammonia 12, lactic acid was normal, troponin x 2 was normal.  SARS coronavirus 2 was negative.  Blood culture pending.  CT chest abdomen and pelvis showed no acute findings within the chest, abdomen or pelvis.  No source of sepsis.  Marked increased colonic stool consistent with constipation.  No bowel obstruction or inflammation. CT head without contrast showed no acute intracranial abnormalities.  Chest x-ray showed patchy bibasilar consolidation, left greater than right, which could reflect infection or asymmetric edema.  Increased  pulmonary vascular congestion.  Patient was empirically started on ceftriaxone and azithromycin.  IV hydration was provided. Hospitalist was asked to admit patient for further evaluation and management.   Assessment and Plan:  Acute metabolic encephalopathy - improving -secondary to urinary tract infection -Continue supportive measures  ESBL E Coli UTI  -presented with Fever/leukocytosis/malaise/AMS -there was initial concern for reemergence of infective endocarditis but given his rapid response to treatment for UTI and given he was fully treated for endocarditis we will see how he responds to UTI treatment -IV meropenem ordered for ESBL treatment  -WBC trending down  -follow blood cultures: no growth to date   Hyponatremia - improving  -suspect from dehydration/poor oral intake -continue IV normal saline -recheck BMP in AM   Iron deficiency anemia  -continue daily iron supplement   Opioid Induced Constipation -dulcolax suppository ordered 9/15 -continue BID miralax treatment  -add nightly senna treatment  Chronic Pain / Opioid Dependence - lidocaine patch - resumed home morphine IR as needed  GAD - he is on alprazolam as needed   Dementia without behavior disturbance - delirium precautions  Essential hypertension  -carvedilol, losartan, add IV hydralazine PRN SBP>175    Hypokalemia - supplemental potassium ordered and repleted  - recheck in AM   History of TAVR 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

## 2023-02-15 NOTE — TOC Progression Note (Signed)
Transition of Care [facility]) - Progression Note    Patient Details  Name: [name] MRN: [id number] Date of Birth: 25-Aug-1950  Transition of Care [facility]) CM/SW Contact  Villa Her[place], [place] Phone Number: 02/15/2023, 11:38 AM  Clinical Narrative:    CSW updated that PT is recommending HH PT for pt at D/C. CSW spoke with Revonda Standard at [facility] who states they set pt up with [facility] at D/C. CSW confirmed with Maralyn Sago that [facility] PT is following pt. CSW asked if [facility] PT/OT/RN can be ordered, awaiting confirmation from Sarah. CSW to request HH orders. TOC to follow.   Expected Discharge Plan: Home w Home Health Services Barriers to Discharge: Continued Medical Work up  Expected Discharge Plan and Services In-house Referral: Clinical Social Work Discharge Planning Services: CM Consult   Living arrangements for the past 2 months: Single Family Home                                       Social Determinants of Health (SDOH) Interventions SDOH Screenings   Food Insecurity: No Food Insecurity (02/13/2023)  Housing: Low Risk  (02/13/2023)  Transportation Needs: No Transportation Needs (02/13/2023)  Utilities: Not At Risk (02/13/2023)  Tobacco Use: Medium Risk (12/29/2022)    Readmission Risk Interventions    02/14/2023   11:03 AM  Readmission Risk Prevention Plan  Transportation Screening Complete  PCP or Specialist appointment within 3-5 days of discharge Complete  HRI or Home Care Consult Complete  SW Recovery Care/Counseling Consult Complete  Palliative Care Screening Not Applicable  Skilled Nursing Facility Not Applicable

## 2023-02-15 NOTE — Plan of Care (Signed)
  Problem: Acute Rehab PT Goals(only PT should resolve) Goal: Pt Will Go Supine/Side To Sit Outcome: Progressing Flowsheets (Taken 02/15/2023 1412) Pt will go Supine/Side to Sit:  with supervision  with contact guard assist Goal: Patient Will Transfer Sit To/From Stand Outcome: Progressing Flowsheets (Taken 02/15/2023 1412) Patient will transfer sit to/from stand:  with supervision  with contact guard assist Goal: Pt Will Transfer Bed To Chair/Chair To Bed Outcome: Progressing Flowsheets (Taken 02/15/2023 1412) Pt will Transfer Bed to Chair/Chair to Bed:  with supervision  with contact guard assist Goal: Pt Will Ambulate Outcome: Progressing Flowsheets (Taken 02/15/2023 1412) Pt will Ambulate:  50 feet  with supervision  with contact guard assist  with rolling walker   2:13 PM, 02/15/23 Ocie Bob, MPT Physical Therapist with [facility] [phone] office [phone] mobile phone

## 2023-02-15 NOTE — Evaluation (Signed)
Physical Therapy Evaluation Patient Details Name: [name] MRN: [id number] DOB: [birth year] Today's Date: 02/15/2023  History of Present Illness  [name] is a 72 y.o. male with medical history significant of hypertension, atrial fibrillation on anticoagulation, TAVR, COPD, GERD, mild dementia, prior CVA x 2 who presents to the emergency department from home via EMS due to altered mental status which started yesterday. On arrival of EMS team, patient was said to have fever of 101F and was taken to the ED for further evaluation and management.  Patient was reported to be confused on arrival to the ED. he denies chest pain, shortness of breath, vomiting, diarrhea, abdominal pain.   Clinical Impression  Patient demonstrates labored movement for sitting up at bedside requiring HOB partially raised, fair/good return for transferring to/from commode in bathroom and able to ambulate in room using RW without loss of balance.  Patient tolerated sitting up in chair after therapy.  Patient will benefit from continued skilled physical therapy in hospital and recommended venue below to increase strength, balance, endurance for safe ADLs and gait.          If plan is discharge home, recommend the following: A little help with walking and/or transfers;A little help with bathing/dressing/bathroom;Help with stairs or ramp for entrance;Assistance with cooking/housework   Can travel by private vehicle        Equipment Recommendations None recommended by PT  Recommendations for Other Services       Functional Status Assessment Patient has had a recent decline in their functional status and demonstrates the ability to make significant improvements in function in a reasonable and predictable amount of time.     Precautions / Restrictions Precautions Precautions: Fall Restrictions Weight Bearing Restrictions: No      Mobility  Bed Mobility Overal bed mobility: Needs Assistance Bed  Mobility: Supine to Sit     Supine to sit: Min assist, HOB elevated     General bed mobility comments: increased time, labored movement    Transfers Overall transfer level: Needs assistance Equipment used: Rolling walker (2 wheels) Transfers: Sit to/from Stand, Bed to chair/wheelchair/BSC Sit to Stand: Min assist   Step pivot transfers: Min assist       General transfer comment: slow labored movemet    Ambulation/Gait Ambulation/Gait assistance: Min assist, Contact guard assist Gait Distance (Feet): 30 Feet Assistive device: Rolling walker (2 wheels) Gait Pattern/deviations: Decreased step length - left, Decreased step length - right, Decreased stride length, Trunk flexed Gait velocity: decreased     General Gait Details: slow labored cadence without loss of balance, limited mostly due to c/o fatigue  Stairs            Wheelchair Mobility     Tilt Bed    Modified Rankin (Stroke Patients Only)       Balance Overall balance assessment: Needs assistance Sitting-balance support: Feet supported, No upper extremity supported Sitting balance-Leahy Scale: Fair Sitting balance - Comments: fair/good seated at EOB   Standing balance support: Reliant on assistive device for balance, During functional activity, Bilateral upper extremity supported Standing balance-Leahy Scale: Fair Standing balance comment: using RW                             Pertinent Vitals/Pain Pain Assessment Pain Assessment: No/denies pain    Home Living Family/patient expects to be discharged to:: Private residence Living Arrangements: Spouse/significant other Available Help at Discharge: Family;Available 24 hours/day Type of  Home: House Home Access: Ramped entrance       Home Layout: One level Home Equipment: Agricultural consultant (2 wheels);Rollator (4 wheels) Additional Comments: wife unable to physically assist    Prior Function Prior Level of Function :  Independent/Modified Independent;History of Falls (last six months)             Mobility Comments: ambulates with RW, short household distances, hx of falls ADLs Comments: wife assists as needed (is a retired Pharmacist, community)     Extremity/Trunk Assessment   Upper Extremity Assessment Upper Extremity Assessment: Defer to OT evaluation    Lower Extremity Assessment Lower Extremity Assessment: Generalized weakness    Cervical / Trunk Assessment Cervical / Trunk Assessment: Normal  Communication   Communication Communication: No apparent difficulties  Cognition Arousal: Alert Behavior During Therapy: WFL for tasks assessed/performed Overall Cognitive Status: Within Functional Limits for tasks assessed                                          General Comments      Exercises     Assessment/Plan    PT Assessment Patient needs continued PT services  PT Problem List Decreased strength;Decreased activity tolerance;Decreased balance;Decreased mobility       PT Treatment Interventions DME instruction;Gait training;Stair training;Functional mobility training;Therapeutic activities;Therapeutic exercise;Balance training;Patient/family education    PT Goals (Current goals can be found in the Care Plan section)  Acute Rehab PT Goals Patient Stated Goal: return home with family to assist PT Goal Formulation: With patient Time For Goal Achievement: 02/19/23 Potential to Achieve Goals: Good    Frequency Min 3X/week     Co-evaluation PT/OT/SLP Co-Evaluation/Treatment: Yes Reason for Co-Treatment: Complexity of the patient's impairments (multi-system involvement);To address functional/ADL transfers PT goals addressed during session: Mobility/safety with mobility;Balance;Proper use of DME         AM-PAC PT "6 Clicks" Mobility  Outcome Measure Help needed turning from your back to your side while in a flat bed without using bedrails?: A Little Help needed moving from  lying on your back to sitting on the side of a flat bed without using bedrails?: A Little Help needed moving to and from a bed to a chair (including a wheelchair)?: A Little Help needed standing up from a chair using your arms (e.g., wheelchair or bedside chair)?: A Little Help needed to walk in hospital room?: A Little Help needed climbing 3-5 steps with a railing? : A Lot 6 Click Score: 17    End of Session   Activity Tolerance: Patient tolerated treatment well;Patient limited by fatigue Patient left: in chair;with call bell/phone within reach Nurse Communication: Mobility status PT Visit Diagnosis: Unsteadiness on feet (R26.81);Other abnormalities of gait and mobility (R26.89);Muscle weakness (generalized) (M62.81)    Time: 1610-9604 PT Time Calculation (min) (ACUTE ONLY): 23 min   Charges:   PT Evaluation $PT Eval Moderate Complexity: 1 Mod PT Treatments $Therapeutic Activity: 23-37 mins PT General Charges $$ ACUTE PT VISIT: 1 Visit         2:11 PM, 02/15/23 Ocie Bob, MPT Physical Therapist with [facility] [phone] office [phone] mobile phone

## 2023-02-15 NOTE — Progress Notes (Deleted)
Cardiology Office Note:  .   Date:  02/15/2023  ID:  [name], DOB 20-Jul-1950, MRN [id number] PCP: [name], MD  [facility] Providers Cardiologist:  Bryan Lemma, MD Structural Heart:  Tonny Bollman, MD{ Click to update primary MD,subspecialty MD or APP then REFRESH:1}    No chief complaint on file.   History of Present Illness: .     [name] is a *** 72 y.o. male *** with a PMH notable for *** who presents here for *** at the request of Hal Hope, Marcos Eke, MD.  [name] was last seen on ***     Subjective   INTERVAL HISTORY   ROS:  Cardiovascular ROS: {roscv:310661} Review of Systems - {ros master:310782}     Objective   Studies Reviewed: [name]        ECHO 11/19/2022:: EF 60 to 65%.  No RWMA.  GR 1 DD.  Elevated LAP.  Normal RAP and RVP.  29 mm SAPIEN prosthetic TAVR valve well-positioned.  Mean AVG 20 mmHg (increased from previous study of 11 million mercury in 2022.  Thoracic aorta measured at 40 mm.  Normal RV.   Limited Echo 12/12/2022: EF 60 to 65%.  Mild LVH.  GR 1 DD.  Mean AVG of TAVR valve 16 mmHg.  Mitral valve is abnormal.  Mild MR.  Severe MAC.  Mobile pedunculated echodensity attached to the ventricular surface of anterior MVL protruding into LVOT.  Could potentially be attached to AoV TAVR prosthesis.  Recommend TEE. TEE 12/14/2022: Mobile, filamentous mass in the LVOT adherent to the AoV prosthesis valve-likely vegetation.  Mild-moderate AoV stenosis-mean AVG 15 mmHg. => Findings concerning for Prosthetic TAVR-AoV vegetation/bacterial endocarditis.  Also noted grade 2 ascending aortic and aortic arch plaque. Echo 02/14/2023: EF 55-60%.  Mild LA dilation.  Prosthetic AoV mean AVG 14.5 mmHg.  Mild to moderate MR.  Mild MS.  (Mean MBG 4.7 mmHg).  Previously identified AoV vegetation not seen.  Risk Assessment/Calculations:    CHA2DS2-VASc Score =    {Confirm score is correct.  If not, click here to update score.  REFRESH note.   :1} This indicates a  % annual risk of stroke. The patient's score is based upon:    {This patient has a significant risk of stroke if diagnosed with atrial fibrillation.  Please consider VKA or DOAC agent for anticoagulation if the bleeding risk is acceptable.   You can also use the SmartPhrase .[email or web address] for documentation.   :[id number]}           Physical Exam:   VS:  There were no vitals taken for this visit.   Wt Readings from Last 3 Encounters:  02/14/23 180 lb 8.9 oz (81.9 kg)  01/08/23 175 lb 7.8 oz (79.6 kg)  12/11/22 179 lb 14.3 oz (81.6 kg)    GEN: Well nourished, well developed in no acute distress; *** NECK: No JVD; No carotid bruits CARDIAC: Normal S1, S2; RRR, no murmurs, rubs, gallops RESPIRATORY:  Clear to auscultation without rales, wheezing or rhonchi ; nonlabored, good air movement. ABDOMEN: Soft, non-tender, non-distended EXTREMITIES:  No edema; No deformity      ASSESSMENT AND PLAN: .    Problem List Items Addressed This Visit   None       {Are you ordering a CV Procedure (e.g. stress test, cath, DCCV, TEE, etc)?   Press F2        :[id number]}   Dispo: No follow-ups on file.  Total time  spent: *** min spent with patient + *** min spent charting = *** min  Signed, Marykay Lex, MD, MS Bryan Lemma, M.D., M.S. Interventional Cardiologist  [facility]  Pager # [phone] Phone # [phone] [address] [place], [place] [address]

## 2023-02-15 NOTE — Plan of Care (Signed)
  Problem: Acute Rehab OT Goals (only OT should resolve) Goal: Pt. Will Perform Grooming Flowsheets (Taken 02/15/2023 1002) Pt Will Perform Grooming:  with supervision  sitting  standing Goal: Pt. Will Perform Lower Body Dressing Flowsheets (Taken 02/15/2023 1002) Pt Will Perform Lower Body Dressing:  with supervision  sitting/lateral leans  sit to/from stand Goal: Pt. Will Transfer To Toilet Flowsheets (Taken 02/15/2023 1002) Pt Will Transfer to Toilet:  with supervision  ambulating  regular height toilet Goal: Pt. Will Perform Toileting-Clothing Manipulation Flowsheets (Taken 02/15/2023 1002) Pt Will Perform Toileting - Clothing Manipulation and hygiene:  with supervision  sitting/lateral leans  sit to/from stand Goal: Pt/Caregiver Will Perform Home Exercise Program Flowsheets (Taken 02/15/2023 1002) Pt/caregiver will Perform Home Exercise Program:  Increased strength  Both right and left upper extremity  Independently  With written HEP provided

## 2023-02-15 NOTE — Evaluation (Signed)
Occupational Therapy Evaluation Patient Details Name: LOTUS MCCLENAGHAN MRN: [id number] DOB: 07-Nov-1950 Today's Date: 02/15/2023   History of Present Illness [name] is a 72 y.o. male with medical history significant of hypertension, atrial fibrillation on anticoagulation, TAVR, COPD, GERD, mild dementia, prior CVA x 2 who presents to the emergency department from home via EMS due to altered mental status which started yesterday. On arrival of EMS team, patient was said to have fever of 101F and was taken to the ED for further evaluation and management.  Patient was reported to be confused on arrival to the ED. he denies chest pain, shortness of breath, vomiting, diarrhea, abdominal pain.   Clinical Impression   Pt agreeable to OT/PT evaluation this am. Pt alert and oriented, able to provide PMH and level of functioning. Pt requiring assistance with ADLs due to generalized weakness and unsteadiness. OT assisting with hygiene tasks with pt in standing. Recommend continued skilled OT services to improve safety and independence in ADL completion.        If plan is discharge home, recommend the following: A little help with walking and/or transfers;A little help with bathing/dressing/bathroom;Assistance with cooking/housework;Assist for transportation;Help with stairs or ramp for entrance    Functional Status Assessment  Patient has had a recent decline in their functional status and demonstrates the ability to make significant improvements in function in a reasonable and predictable amount of time.  Equipment Recommendations  None recommended by OT       Precautions / Restrictions Precautions Precautions: Fall Restrictions Weight Bearing Restrictions: No      Mobility Bed Mobility               General bed mobility comments: Defer to PT    Transfers                   General transfer comment: Defer to PT          ADL either performed or assessed with  clinical judgement   ADL Overall ADL's : Needs assistance/impaired Eating/Feeding: Modified independent;Sitting;Bed level   Grooming: Set up;Sitting Grooming Details (indicate cue type and reason): provided washcloth for hand hygiene after toileting                 Toilet Transfer: Minimal assistance;Ambulation;Regular Toilet;Rolling walker (2 wheels);Grab bars Toilet Transfer Details (indicate cue type and reason): cuing for hand placement Toileting- Clothing Manipulation and Hygiene: Moderate assistance;Sitting/lateral lean;Sit to/from stand Toileting - Clothing Manipulation Details (indicate cue type and reason): pt requiring assist to stand, OT then assisting with peri-hygiene so pt could maintain balance using grab bars/RW     Functional mobility during ADLs: Minimal assistance;Rolling walker (2 wheels) General ADL Comments: Pt requiring assist for ADL completion due to generalized weakness and being unsteady. For standing tasks pt using BUE to hold RW or grab bars, OT providing physical assist     Vision Patient Visual Report: No change from baseline Vision Assessment?: No apparent visual deficits            Pertinent Vitals/Pain Pain Assessment Pain Assessment: No/denies pain     Extremity/Trunk Assessment Upper Extremity Assessment Upper Extremity Assessment: Generalized weakness   Lower Extremity Assessment Lower Extremity Assessment: Defer to PT evaluation       Communication Communication Communication: No apparent difficulties   Cognition Arousal: Alert Behavior During Therapy: WFL for tasks assessed/performed Overall Cognitive Status: Within Functional Limits for tasks assessed  Home Living Family/patient expects to be discharged to:: Private residence Living Arrangements: Spouse/significant other Available Help at Discharge: Family;Available 24 hours/day Type of Home:  House Home Access: Ramped entrance     Home Layout: One level     Bathroom Shower/Tub: Chief Strategy Officer: Handicapped height     Home Equipment: Agricultural consultant (2 wheels);Rollator (4 wheels)   Additional Comments: wife unable to physically assist      Prior Functioning/Environment Prior Level of Function : Independent/Modified Independent             Mobility Comments: ambulates with RW, hx of falls ADLs Comments: wife assists as needed (is a retired Pharmacist, community)        OT Problem List: Decreased strength;Decreased activity tolerance;Impaired balance (sitting and/or standing);Decreased knowledge of use of DME or AE      OT Treatment/Interventions: Self-care/ADL training;Therapeutic exercise;DME and/or AE instruction;Therapeutic activities;Patient/family education    OT Goals(Current goals can be found in the care plan section) Acute Rehab OT Goals Patient Stated Goal: To get stronger and go back home OT Goal Formulation: With patient Time For Goal Achievement: 03/01/23 Potential to Achieve Goals: Good  OT Frequency: Min 1X/week    Co-evaluation PT/OT/SLP Co-Evaluation/Treatment: Yes Reason for Co-Treatment: Complexity of the patient's impairments (multi-system involvement)   OT goals addressed during session: ADL's and self-care;Proper use of Adaptive equipment and DME      AM-PAC OT "6 Clicks" Daily Activity     Outcome Measure Help from another person eating meals?: None Help from another person taking care of personal grooming?: A Little Help from another person toileting, which includes using toliet, bedpan, or urinal?: A Lot Help from another person bathing (including washing, rinsing, drying)?: A Lot Help from another person to put on and taking off regular upper body clothing?: A Little Help from another person to put on and taking off regular lower body clothing?: A Lot 6 Click Score: 16   End of Session Equipment Utilized During Treatment:  Rolling walker (2 wheels)  Activity Tolerance: Patient tolerated treatment well Patient left: in chair;with call bell/phone within reach;with chair alarm set  OT Visit Diagnosis: Unsteadiness on feet (R26.81);Muscle weakness (generalized) (M62.81);History of falling (Z91.81)                Time: 1610-9604 OT Time Calculation (min): 23 min Charges:  OT General Charges $OT Visit: 1 Visit OT Evaluation $OT Eval Low Complexity: 1 Low  Ezra Sites, OTR/L  [phone] 02/15/2023, 10:00 AM

## 2023-02-16 ENCOUNTER — Inpatient Hospital Stay (HOSPITAL_COMMUNITY): Payer: Medicare HMO

## 2023-02-16 DIAGNOSIS — E871 Hypo-osmolality and hyponatremia: Secondary | ICD-10-CM | POA: Diagnosis not present

## 2023-02-16 DIAGNOSIS — I10 Essential (primary) hypertension: Secondary | ICD-10-CM | POA: Diagnosis not present

## 2023-02-16 DIAGNOSIS — G9341 Metabolic encephalopathy: Secondary | ICD-10-CM | POA: Diagnosis not present

## 2023-02-16 DIAGNOSIS — I4819 Other persistent atrial fibrillation: Secondary | ICD-10-CM | POA: Diagnosis not present

## 2023-02-16 LAB — CBC WITH DIFFERENTIAL/PLATELET
Abs Immature Granulocytes: 0.08 10*3/uL — ABNORMAL HIGH (ref 0.00–0.07)
Basophils Absolute: 0.1 10*3/uL (ref 0.0–0.1)
Basophils Relative: 0 %
Eosinophils Absolute: 0.3 10*3/uL (ref 0.0–0.5)
Eosinophils Relative: 2 %
HCT: 31.3 % — ABNORMAL LOW (ref 39.0–52.0)
Hemoglobin: 9.5 g/dL — ABNORMAL LOW (ref 13.0–17.0)
Immature Granulocytes: 1 %
Lymphocytes Relative: 12 %
Lymphs Abs: 1.8 10*3/uL (ref 0.7–4.0)
MCH: 24 pg — ABNORMAL LOW (ref 26.0–34.0)
MCHC: 30.4 g/dL (ref 30.0–36.0)
MCV: 79 fL — ABNORMAL LOW (ref 80.0–100.0)
Monocytes Absolute: 0.8 10*3/uL (ref 0.1–1.0)
Monocytes Relative: 5 %
Neutro Abs: 12.1 10*3/uL — ABNORMAL HIGH (ref 1.7–7.7)
Neutrophils Relative %: 80 %
Platelets: 447 10*3/uL — ABNORMAL HIGH (ref 150–400)
RBC: 3.96 MIL/uL — ABNORMAL LOW (ref 4.22–5.81)
RDW: 16.7 % — ABNORMAL HIGH (ref 11.5–15.5)
WBC: 15.1 10*3/uL — ABNORMAL HIGH (ref 4.0–10.5)
nRBC: 0 % (ref 0.0–0.2)

## 2023-02-16 LAB — BASIC METABOLIC PANEL
Anion gap: 9 (ref 5–15)
BUN: 12 mg/dL (ref 8–23)
CO2: 22 mmol/L (ref 22–32)
Calcium: 8.9 mg/dL (ref 8.9–10.3)
Chloride: 101 mmol/L (ref 98–111)
Creatinine, Ser: 0.7 mg/dL (ref 0.61–1.24)
GFR, Estimated: >60 mL/min (ref >60)
Glucose, Bld: 87 mg/dL (ref 70–99)
Potassium: 3.6 mmol/L (ref 3.5–5.1)
Sodium: 132 mmol/L — ABNORMAL LOW (ref 135–145)

## 2023-02-16 MED ORDER — POLYETHYLENE GLYCOL 3350 17 G PO PACK
17.0000 g | PACK | Freq: Two times a day (BID) | ORAL | Status: DC
  Administered 2023-02-16 – 2023-02-18 (×4): 17 g via ORAL
  Filled 2023-02-16 (×4): qty 1

## 2023-02-16 MED ORDER — SENNOSIDES-DOCUSATE SODIUM 8.6-50 MG PO TABS
1.0000 | ORAL_TABLET | Freq: Every day | ORAL | Status: DC
  Administered 2023-02-16 – 2023-02-18 (×3): 1 via ORAL
  Filled 2023-02-16 (×3): qty 1

## 2023-02-16 NOTE — Progress Notes (Signed)
Mobility Specialist Progress Note:    02/16/23 1200  Mobility  Activity Transferred from bed to chair  Level of Assistance Minimal assist, patient does 75% or more  Assistive Device Front wheel walker  Distance Ambulated (ft) 4 ft  Range of Motion/Exercises Active;All extremities  Activity Response Tolerated well  Mobility Referral Yes  $Mobility charge 1 Mobility  Mobility Specialist Start Time (ACUTE ONLY) 1200  Mobility Specialist Stop Time (ACUTE ONLY) 1215  Mobility Specialist Time Calculation (min) (ACUTE ONLY) 15 min   Pt received in bed, agreeable to mobility. Peri care necessary, assisted NT. Required MinA to stand EOB with RW. Tolerated well, transferred pt to chair for NT to finish care. Left pt in chair with NT in room, all needs met.   Lawerance Bach Mobility Specialist Please contact via Special educational needs teacher or  Rehab office at [phone]

## 2023-02-16 NOTE — Progress Notes (Addendum)
PROGRESS NOTE   [name]  ONG:[id number] DOB: Nov 15, 1950 DOA: 02/13/2023 PCP: [name], MD   No chief complaint on file.  Level of care: Med-Surg  Brief Admission History:  72 y.o. male with medical history significant of hypertension, atrial fibrillation on anticoagulation, TAVR, COPD, GERD, mild dementia, prior CVA x 2 who presents to the emergency department from home via EMS due to altered mental status which started yesterday. 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  Patient complained of subjective fever yesterday, wife activated EMS due to altered mental status was described as " slower than normal".  On arrival of EMS team, patient was said to have fever of 101F and was taken to the ED for further evaluation and management.  Patient was reported to be confused on arrival to the ED. he denies chest pain, shortness of breath, vomiting, diarrhea, abdominal pain.   ED Course:  In the emergency department, he was hemodynamically stable.  Workup in the ED showed WBC 26.7, hemoglobin 9.5, hematocrit 29.7, MCV 77.1, platelets 382.  BMP sodium 128, potassium 3.5, chloride 97, bicarb 21, blood glucose 118, BUN 16, creatinine 0.83, albumin 2.9, ammonia 12, lactic acid was normal, troponin x 2 was normal.  SARS coronavirus 2 was negative.  Blood culture pending.  CT chest abdomen and pelvis showed no acute findings within the chest, abdomen or pelvis.  No source of sepsis.  Marked increased colonic stool consistent with constipation.  No bowel obstruction or inflammation. CT head without contrast showed no acute intracranial abnormalities.  Chest x-ray showed patchy bibasilar consolidation, left greater than right, which could reflect infection or asymmetric edema.  Increased  pulmonary vascular congestion.  Patient was empirically started on ceftriaxone and azithromycin.  IV hydration was provided. Hospitalist was asked to admit patient for further evaluation and management.   Assessment and Plan:  Acute metabolic encephalopathy - improving -secondary to urinary tract infection -Continue supportive measures  ESBL E Coli UTI  -presented with Fever/leukocytosis/malaise/AMS -there was initial concern for reemergence of infective endocarditis but given his rapid response to treatment for UTI and given he was fully treated for endocarditis we will see how he responds to UTI treatment -IV meropenem ordered for ESBL treatment then changed to Bactrim (planning for 3 day treatment course) -WBC trending down  -follow blood cultures: ESBL E coli   Hyponatremia - improving  -suspect from dehydration/poor oral intake -continue IV normal saline -recheck BMP in AM   Iron deficiency anemia  -continue daily iron supplement   Opioid Induced Constipation -dulcolax suppository ordered 9/15 -continue BID miralax treatment  -add nightly senna treatment - if this regimen is ineffective consider movantik - KUB 9/17 with persistent stool/gas  Chronic Pain / Opioid Dependence - lidocaine patch - resumed home morphine IR as needed  GAD - he is on alprazolam as needed   Dementia without behavior disturbance - delirium precautions  Essential hypertension  -carvedilol, losartan, add IV hydralazine PRN SBP>175    Hypokalemia - supplemental potassium ordered and repleted  - recheck BMP in AM   History of TAVR 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 Left ventricular ejection fraction, by estimation, is 55 to 60%. The left ventricle has normal function. The left ventricle has no regional wall motion abnormalities.   2. Right ventricular systolic function is normal. The right ventricular size is normal.   3. Left atrial size was mildly dilated.   4. The mitral valve is degenerative. Mild to moderate mitral valve regurgitation. Mild mitral stenosis. The mean mitral valve gradient is 4.7 mmHg. Moderate mitral annular calcification.   5. The aortic valve is calcified. Aortic valve regurgitation is not visualized. Mild to moderate aortic valve stenosis. Aortic valve area, by VTI measures 1.77 cm. Aortic valve mean gradient measures 14.5 mmHg.  Aortic valve Vmax measures 2.63 m/s.   6. The inferior vena cava is normal in size with greater than 50% respiratory variability, suggesting right atrial pressure of 3 mmHg.   Antimicrobials:  Ceftriaxone 2 gm IV 9/14>> 9/16 Meropenem 9/16 Bactrim 9/16>>  Subjective: Pt having nausea, stomach upset; had small BM yesterday;   Objective: Vitals:   02/15/23 2012 02/16/23 0410 02/16/23 0500 02/16/23 1412  BP: (!) 169/83 (!) 179/76  107/67  Pulse: (!) 58 62  70  Resp: 18 18    Temp: 98.2 F (36.8 C) 98.3 F (36.8 C)  98.7 F (37.1 C)  TempSrc:    Oral  SpO2: 100% 100%  100%  Weight:   83.4 kg   Height:        Intake/Output Summary (Last 24 hours) at 02/16/2023 1631 Last data filed at 02/16/2023 1330 Gross per 24 hour  Intake 1132.94 ml  Output 1200 ml  Net -67.06 ml   Filed Weights   02/14/23 0519 02/15/23 0504 02/16/23 0500  Weight: 81.9 kg 83.2 kg 83.4 kg   Examination:  General exam: Appears chronically ill, oriented x 3, calm and comfortable  Respiratory system: Clear to auscultation. Respiratory effort normal. Cardiovascular system: normal S1 & S2 heard. No JVD, No  pedal edema. Gastrointestinal system: Abdomen is nondistended, soft and nontender. No organomegaly or masses felt. Normal bowel sounds heard. Central nervous system: Alert and oriented. No focal neurological deficits. Extremities: Symmetric 5 x 5 power. Skin: No rashes, lesions or ulcers. Psychiatry: Judgement and insight appear normal. Mood & affect appropriate.   Data Reviewed: I have personally reviewed following labs and imaging studies  CBC: Recent Labs  Lab 02/13/23 1405 02/14/23 0453 02/15/23 0438 02/16/23 0505  WBC 26.7* 23.3* 15.5* 15.1*  NEUTROABS 25.1*  --  12.4* 12.1*  HGB 9.5* 9.2* 9.1* 9.5*  HCT 29.7* 29.1* 29.6* 31.3*  MCV 77.1* 77.6* 79.8* 79.0*  PLT 382 402* 399 447*    Basic Metabolic Panel: Recent Labs  Lab 02/13/23 1405 02/14/23 0037 02/14/23 0453 02/15/23 0438 02/16/23 0505  NA 128* 132* 130* 134* 132*  K 3.5 3.3* 3.0* 4.2 3.6  CL 97* 98 97* 101 101  CO2 21* 24 23 23 22   GLUCOSE 118* 101* 133* 94 87  BUN 16 14 14 12 12   CREATININE 0.83 0.83 0.77 0.77 0.70  CALCIUM 8.6* 8.8* 8.7* 8.7* 8.9  MG  --   --  1.9  --   --   PHOS  --   --  3.2  --   --  CBG: Recent Labs  Lab 02/15/23 1553  GLUCAP 100*    Recent Results (from the past 240 hour(s))  Blood Culture (routine x 2)     Status: None (Preliminary result)   Collection Time: 02/13/23  2:05 PM   Specimen: BLOOD RIGHT ARM  Result Value Ref Range Status   Specimen Description   Final    BLOOD RIGHT ARM BOTTLES DRAWN AEROBIC AND ANAEROBIC   Special Requests Blood Culture adequate volume  Final   Culture   Final    NO GROWTH 3 DAYS Performed at [facility], [address] [place], [place] [address]    Report Status PENDING  Incomplete  Blood Culture (routine x 2)     Status: None (Preliminary result)   Collection Time: 02/13/23  2:35 PM   Specimen: BLOOD  Result Value Ref Range Status   Specimen Description BLOOD BLOOD LEFT HAND  Final   Special Requests NONE  Final   Culture    Final    NO GROWTH 3 DAYS Performed at [facility], [address] [place], [place] [address]    Report Status PENDING  Incomplete  SARS Coronavirus 2 by RT PCR (hospital order, performed in [facility] hospital lab) *cepheid single result test* Anterior Nasal Swab     Status: None   Collection Time: 02/13/23  3:41 PM   Specimen: Anterior Nasal Swab  Result Value Ref Range Status   SARS Coronavirus 2 by RT PCR NEGATIVE NEGATIVE Final    Comment: (NOTE) SARS-CoV-2 target nucleic acids are NOT DETECTED.  The SARS-CoV-2 RNA is generally detectable in upper and lower respiratory specimens during the acute phase of infection. The lowest concentration of SARS-CoV-2 viral copies this assay can detect is 250 copies / mL. A negative result does not preclude SARS-CoV-2 infection and should not be used as the sole basis for treatment or other patient management decisions.  A negative result may occur with improper specimen collection / handling, submission of specimen other than nasopharyngeal swab, presence of viral mutation(s) within the areas targeted by this assay, and inadequate number of viral copies (<250 copies / mL). A negative result must be combined with clinical observations, patient history, and epidemiological information.  Fact Sheet for Patients:   RoadLapTop.co.za  Fact Sheet for Healthcare Providers: http://kim-miller.com/  This test is not yet approved or  cleared by the [place] FDA and has been authorized for detection and/or diagnosis of SARS-CoV-2 by FDA under an Emergency Use Authorization (EUA).  This EUA will remain in effect (meaning this test can be used) for the duration of the COVID-19 declaration under Section 564(b)(1) of the Act, 21 U.S.C. section 360bbb-3(b)(1), unless the authorization is terminated or revoked sooner.  Performed at [facility], [address] [place], [place] [address]   Urine Culture      Status: Abnormal   Collection Time: 02/13/23  3:54 PM   Specimen: Urine, Random  Result Value Ref Range Status   Specimen Description   Final    URINE, RANDOM Performed at [facility], [address], [place], [place] [address]    Special Requests   Final    NONE Reflexed from [phone] Performed at [facility], [address], [place], [place] [address]    Culture (A)  Final    >=100,000 COLONIES/mL ESCHERICHIA COLI Confirmed Extended Spectrum Beta-Lactamase Producer (ESBL).  In bloodstream infections from ESBL organisms, carbapenems are preferred over piperacillin/tazobactam. They are shown to have a lower risk of mortality.  Report Status 02/15/2023 FINAL  Final   Organism ID, Bacteria ESCHERICHIA COLI (A)  Final      Susceptibility   Escherichia coli - MIC*    AMPICILLIN >=32 RESISTANT Resistant     CEFAZOLIN >=64 RESISTANT Resistant     CEFEPIME >=32 RESISTANT Resistant     CEFTRIAXONE >=64 RESISTANT Resistant     CIPROFLOXACIN >=4 RESISTANT Resistant     GENTAMICIN <=1 SENSITIVE Sensitive     IMIPENEM <=0.25 SENSITIVE Sensitive     NITROFURANTOIN 128 RESISTANT Resistant     TRIMETH/SULFA <=20 SENSITIVE Sensitive     AMPICILLIN/SULBACTAM >=32 RESISTANT Resistant     PIP/TAZO 8 SENSITIVE Sensitive     * >=100,000 COLONIES/mL ESCHERICHIA COLI     Radiology Studies: DG Abd 1 View  Result Date: 02/16/2023 CLINICAL DATA:  72 year old male history of emesis. EXAM: ABDOMEN - 1 VIEW COMPARISON:  Abdominal radiograph 02/08/2017. FINDINGS: Gas and stool are seen scattered throughout the colon extending to the level of the distal rectum. No pathologic distension of small bowel is noted. No gross evidence of pneumoperitoneum. Surgical clips project over the epigastric region. IMPRESSION: 1. Nonspecific, nonobstructive bowel gas pattern. 2. No pneumoperitoneum. Electronically Signed   By: Trudie Reed M.D.   On: 02/16/2023 07:24    Scheduled Meds:  apixaban  5 mg Oral BID    bisacodyl  10 mg Rectal Once   carvedilol  6.25 mg Oral BID WC   cholecalciferol  5,000 Units Oral Daily   ferrous sulfate  325 mg Oral Daily   levothyroxine  25 mcg Oral Q0600   losartan  25 mg Oral Daily   mometasone-formoterol  2 puff Inhalation BID   multivitamin with minerals  1 tablet Oral QHS   polyethylene glycol  17 g Oral Daily   rosuvastatin  40 mg Oral Daily   senna-docusate  2 tablet Oral QHS   sertraline  150 mg Oral QHS   sulfamethoxazole-trimethoprim  1 tablet Oral Q12H   Continuous Infusions:  sodium chloride 50 mL/hr at 02/15/23 1601    LOS: 3 days   Time spent: 55 mins  Adilynn Bessey Laural Benes, MD How to contact the [facility] Attending or Consulting provider 7A - 7P or covering provider during after hours 7P -7A, for this patient?  Check the care team in [facility] and look for a) attending/consulting TR[facility] provider listed and b) the [facility] team listed Log into [email or web address] and use 's universal password to access. If you do not have the password, please contact the hospital operator. Locate the [facility] provider you are looking for under [facility] and page to a number that you can be directly reached. If you still have difficulty reaching the provider, please page the [facility] (Director on Call) for the Hospitalists listed on amion for assistance.  02/16/2023, 4:31 PM

## 2023-02-17 ENCOUNTER — Inpatient Hospital Stay (HOSPITAL_COMMUNITY): Payer: Medicare HMO

## 2023-02-17 DIAGNOSIS — G9341 Metabolic encephalopathy: Secondary | ICD-10-CM | POA: Diagnosis not present

## 2023-02-17 DIAGNOSIS — K5903 Drug induced constipation: Secondary | ICD-10-CM | POA: Diagnosis not present

## 2023-02-17 DIAGNOSIS — I4819 Other persistent atrial fibrillation: Secondary | ICD-10-CM | POA: Diagnosis not present

## 2023-02-17 DIAGNOSIS — A419 Sepsis, unspecified organism: Secondary | ICD-10-CM | POA: Diagnosis not present

## 2023-02-17 LAB — URINALYSIS, W/ REFLEX TO CULTURE (INFECTION SUSPECTED)
Bacteria, UA: NONE SEEN
Bilirubin Urine: NEGATIVE
Glucose, UA: NEGATIVE mg/dL
Hgb urine dipstick: NEGATIVE
Ketones, ur: NEGATIVE mg/dL
Nitrite: NEGATIVE
Protein, ur: NEGATIVE mg/dL
Specific Gravity, Urine: 1.011 (ref 1.005–1.030)
pH: 6 (ref 5.0–8.0)

## 2023-02-17 LAB — FOLATE: Folate: 9.5 ng/mL (ref >5.9)

## 2023-02-17 LAB — TSH: TSH: 1.792 u[IU]/mL (ref 0.350–4.500)

## 2023-02-17 LAB — PROCALCITONIN: Procalcitonin: 0.41 ng/mL

## 2023-02-17 LAB — VITAMIN B12: Vitamin B-12: 235 pg/mL (ref 180–914)

## 2023-02-17 MED ORDER — FOLIC ACID 1 MG PO TABS
1.0000 mg | ORAL_TABLET | Freq: Every day | ORAL | Status: DC
  Administered 2023-02-17 – 2023-02-19 (×3): 1 mg via ORAL
  Filled 2023-02-17 (×3): qty 1

## 2023-02-17 MED ORDER — POTASSIUM CHLORIDE IN NACL 20-0.9 MEQ/L-% IV SOLN: INTRAVENOUS | Status: AC

## 2023-02-17 MED ORDER — CYANOCOBALAMIN 1000 MCG/ML IJ SOLN
1000.0000 ug | Freq: Once | INTRAMUSCULAR | Status: AC
  Administered 2023-02-17: 1000 ug via INTRAMUSCULAR
  Filled 2023-02-17: qty 1

## 2023-02-17 NOTE — Plan of Care (Signed)

## 2023-02-17 NOTE — Progress Notes (Addendum)
Mobility Specialist Progress Note:    02/17/23 1050  Mobility  Activity Dangled on edge of bed  Level of Assistance Minimal assist, patient does 75% or more  Assistive Device  --   Distance Ambulated (ft)  --   Range of Motion/Exercises Active;All extremities  Activity Response Tolerated well  Mobility Referral Yes  $Mobility charge 1 Mobility  Mobility Specialist Start Time (ACUTE ONLY) 1050  Mobility Specialist Stop Time (ACUTE ONLY) 1100  Mobility Specialist Time Calculation (min) (ACUTE ONLY) 10 min   Pt received in bed, agreeable to mobility. Required MinA to sit EOB with no AD. Tolerated well, deferred ambulation and transfer to chair d/t nausea and dizziness. Returned pt supine, bed alarm on. Call bell in reach, all needs met.   Lawerance Bach Mobility Specialist Please contact via Special educational needs teacher or  Rehab office at [phone]

## 2023-02-17 NOTE — Progress Notes (Signed)
PROGRESS NOTE  [name] WUX:[id number] DOB: 1951-03-30 DOA: 02/13/2023 PCP: [name], MD  Brief History:  72 y.o. male with medical history significant of hypertension, atrial fibrillation on anticoagulation, TAVR, COPD, GERD, mild dementia, prior CVA x 2 who presents to the emergency department from home via EMS due to altered mental status which started yesterday. 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  Patient complained of subjective fever yesterday, wife activated EMS due to altered mental status was described as " slower than normal".  On arrival of EMS team, patient was said to have fever of 101F and was taken to the ED for further evaluation and management.  Patient was reported to be confused on arrival to the ED. he denies chest pain, shortness of breath, vomiting, diarrhea, abdominal pain.   ED Course:  In the emergency department, he was hemodynamically stable.  Workup in the ED showed WBC 26.7, hemoglobin 9.5, hematocrit 29.7, MCV 77.1, platelets 382.  BMP sodium 128, potassium 3.5, chloride 97, bicarb 21, blood glucose 118, BUN 16, creatinine 0.83, albumin 2.9, ammonia 12, lactic acid was normal, troponin x 2 was normal.  SARS coronavirus 2 was negative.  Blood culture pending.  CT chest abdomen and pelvis showed no acute findings within the chest, abdomen or pelvis.  No source of sepsis.  Marked increased colonic stool consistent with constipation.  No bowel obstruction or inflammation. CT head without contrast showed no acute intracranial abnormalities.  Chest x-ray showed patchy bibasilar consolidation, left greater than right, which could reflect infection or asymmetric edema.  Increased pulmonary vascular congestion.  Patient was empirically  started on ceftriaxone and azithromycin.  IV hydration was provided. Hospitalist was asked to admit patient for further evaluation and management.   Assessment/Plan:  Acute metabolic encephalopathy - improving -secondary to urinary tract infection -mental status improving -Continue supportive measures   ESBL E Coli UTI  -presented with Fever/leukocytosis/malaise/AMS -there was initial concern for reemergence of infective endocarditis but given his rapid response to treatment for UTI and given he was fully treated for endocarditis we will see how he responds to UTI treatment -IV meropenem ordered for ESBL treatment then changed to Bactrim (planning for 3 day treatment course) -follow blood cultures: neg to date -WBC trending up   Hyponatremia - improving  -suspect from dehydration/poor oral intake -restart IVF -recheck BMP in AM    Iron deficiency anemia  -continue daily iron supplement    Opioid Induced Constipation -dulcolax suppository ordered 9/15 -continue BID miralax treatment  -add nightly senna treatment - if this regimen is ineffective consider movantik - KUB 9/17 with persistent stool/gas   Chronic Pain / Opioid Dependence - lidocaine patch - resumed home morphine IR as needed   GAD - he is on alprazolam as needed    Dementia without behavior disturbance - delirium precautions   Essential hypertension  -carvedilol, losartan, add IV hydralazine PRN SBP>175     Hypokalemia - supplemental potassium ordered and repleted  - recheck BMP in AM    History of TAVR 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 male with medical history significant of hypertension, atrial fibrillation on anticoagulation, TAVR, COPD, GERD, mild dementia, prior CVA x 2 who presents to the emergency department from home via EMS due to altered mental status which started yesterday. 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  Patient complained of subjective fever yesterday, wife activated EMS due to altered mental status was described as " slower than normal".  On arrival of EMS team, patient was said to have fever of 101F and was taken to the ED for further evaluation and management.  Patient was reported to be confused on arrival to the ED. he denies chest pain, shortness of breath, vomiting, diarrhea, abdominal pain.   ED Course:  In the emergency department, he was hemodynamically stable.  Workup in the ED showed WBC 26.7, hemoglobin 9.5, hematocrit 29.7, MCV 77.1, platelets 382.  BMP sodium 128, potassium 3.5, chloride 97, bicarb 21, blood glucose 118, BUN 16, creatinine 0.83, albumin 2.9, ammonia 12, lactic acid was normal, troponin x 2 was normal.  SARS coronavirus 2 was negative.  Blood culture pending.  CT chest abdomen and pelvis showed no acute findings within the chest, abdomen or pelvis.  No source of sepsis.  Marked increased colonic stool consistent with constipation.  No bowel obstruction or inflammation. CT head without contrast showed no acute intracranial abnormalities.  Chest x-ray showed patchy bibasilar consolidation, left greater than right, which could reflect infection or asymmetric edema.  Increased pulmonary vascular congestion.  Patient was empirically  started on ceftriaxone and azithromycin.  IV hydration was provided. Hospitalist was asked to admit patient for further evaluation and management.   Assessment/Plan:  Acute metabolic encephalopathy - improving -secondary to urinary tract infection -mental status improving -Continue supportive measures   ESBL E Coli UTI  -presented with Fever/leukocytosis/malaise/AMS -there was initial concern for reemergence of infective endocarditis but given his rapid response to treatment for UTI and given he was fully treated for endocarditis we will see how he responds to UTI treatment -IV meropenem ordered for ESBL treatment then changed to Bactrim (planning for 3 day treatment course) -follow blood cultures: neg to date -WBC trending up   Hyponatremia - improving  -suspect from dehydration/poor oral intake -restart IVF -recheck BMP in AM    Iron deficiency anemia  -continue daily iron supplement    Opioid Induced Constipation -dulcolax suppository ordered 9/15 -continue BID miralax treatment  -add nightly senna treatment - if this regimen is ineffective consider movantik - KUB 9/17 with persistent stool/gas   Chronic Pain / Opioid Dependence - lidocaine patch - resumed home morphine IR as needed   GAD - he is on alprazolam as needed    Dementia without behavior disturbance - delirium precautions   Essential hypertension  -carvedilol, losartan, add IV hydralazine PRN SBP>175     Hypokalemia - supplemental potassium ordered and repleted  - recheck BMP in AM    History of TAVR 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 male with medical history significant of hypertension, atrial fibrillation on anticoagulation, TAVR, COPD, GERD, mild dementia, prior CVA x 2 who presents to the emergency department from home via EMS due to altered mental status which started yesterday. 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  Patient complained of subjective fever yesterday, wife activated EMS due to altered mental status was described as " slower than normal".  On arrival of EMS team, patient was said to have fever of 101F and was taken to the ED for further evaluation and management.  Patient was reported to be confused on arrival to the ED. he denies chest pain, shortness of breath, vomiting, diarrhea, abdominal pain.   ED Course:  In the emergency department, he was hemodynamically stable.  Workup in the ED showed WBC 26.7, hemoglobin 9.5, hematocrit 29.7, MCV 77.1, platelets 382.  BMP sodium 128, potassium 3.5, chloride 97, bicarb 21, blood glucose 118, BUN 16, creatinine 0.83, albumin 2.9, ammonia 12, lactic acid was normal, troponin x 2 was normal.  SARS coronavirus 2 was negative.  Blood culture pending.  CT chest abdomen and pelvis showed no acute findings within the chest, abdomen or pelvis.  No source of sepsis.  Marked increased colonic stool consistent with constipation.  No bowel obstruction or inflammation. CT head without contrast showed no acute intracranial abnormalities.  Chest x-ray showed patchy bibasilar consolidation, left greater than right, which could reflect infection or asymmetric edema.  Increased pulmonary vascular congestion.  Patient was empirically  started on ceftriaxone and azithromycin.  IV hydration was provided. Hospitalist was asked to admit patient for further evaluation and management.   Assessment/Plan:  Acute metabolic encephalopathy - improving -secondary to urinary tract infection -mental status improving -Continue supportive measures   ESBL E Coli UTI  -presented with Fever/leukocytosis/malaise/AMS -there was initial concern for reemergence of infective endocarditis but given his rapid response to treatment for UTI and given he was fully treated for endocarditis we will see how he responds to UTI treatment -IV meropenem ordered for ESBL treatment then changed to Bactrim (planning for 3 day treatment course) -follow blood cultures: neg to date -WBC trending up   Hyponatremia - improving  -suspect from dehydration/poor oral intake -restart IVF -recheck BMP in AM    Iron deficiency anemia  -continue daily iron supplement    Opioid Induced Constipation -dulcolax suppository ordered 9/15 -continue BID miralax treatment  -add nightly senna treatment - if this regimen is ineffective consider movantik - KUB 9/17 with persistent stool/gas   Chronic Pain / Opioid Dependence - lidocaine patch - resumed home morphine IR as needed   GAD - he is on alprazolam as needed    Dementia without behavior disturbance - delirium precautions   Essential hypertension  -carvedilol, losartan, add IV hydralazine PRN SBP>175     Hypokalemia - supplemental potassium ordered and repleted  - recheck BMP in AM    History of TAVR 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  In bloodstream infections from ESBL organisms, carbapenems are preferred over piperacillin/tazobactam. They are shown to have a lower risk of mortality.    Report Status 02/15/2023 FINAL  Final   Organism ID, Bacteria ESCHERICHIA COLI (A)  Final      Susceptibility   Escherichia coli - MIC*    AMPICILLIN >=32 RESISTANT Resistant     CEFAZOLIN >=64 RESISTANT Resistant     CEFEPIME >=32 RESISTANT Resistant     CEFTRIAXONE >=64 RESISTANT Resistant     CIPROFLOXACIN >=4 RESISTANT Resistant     GENTAMICIN <=1 SENSITIVE Sensitive     IMIPENEM <=0.25 SENSITIVE Sensitive     NITROFURANTOIN 128 RESISTANT Resistant     TRIMETH/SULFA <=20 SENSITIVE Sensitive     AMPICILLIN/SULBACTAM >=32 RESISTANT Resistant     PIP/TAZO 8 SENSITIVE Sensitive     * >=100,000 COLONIES/mL ESCHERICHIA COLI     Scheduled Meds:  apixaban  5 mg Oral BID   bisacodyl  10 mg Rectal Once   carvedilol  6.25 mg Oral BID WC   cholecalciferol  5,000 Units Oral Daily   ferrous sulfate  325 mg Oral Daily   levothyroxine  25 mcg Oral Q0600   losartan  25 mg Oral Daily   mometasone-formoterol  2 puff Inhalation BID   multivitamin with minerals  1 tablet Oral QHS   polyethylene glycol  17 g Oral BID   rosuvastatin  40 mg Oral Daily   senna-docusate  1 tablet Oral QHS   sertraline  150 mg Oral QHS   sulfamethoxazole-trimethoprim  1 tablet Oral Q12H   Continuous Infusions:   sodium chloride 50 mL/hr at 02/15/23 1601    Procedures/Studies: DG Abd 1 View  Result Date: 02/16/2023 CLINICAL DATA:  72 year old male history of emesis. EXAM: ABDOMEN - 1 VIEW COMPARISON:  Abdominal radiograph 02/08/2017. FINDINGS: Gas and stool are seen scattered throughout the colon extending to the level of the distal rectum. No pathologic distension of small bowel is noted. No gross evidence of pneumoperitoneum. Surgical clips project over the epigastric region. IMPRESSION: 1. Nonspecific, nonobstructive bowel gas pattern. 2. No pneumoperitoneum. Electronically Signed   By: Trudie Reed M.D.   On: 02/16/2023 07:24   ECHOCARDIOGRAM COMPLETE  Result Date: 02/14/2023    ECHOCARDIOGRAM REPORT   Patient Name:   BEKIM VENNE Date of Exam: 02/14/2023 Medical Rec #:  [id number]       Height:       73.5 in Accession #:    [id number]      Weight:       180.6 lb Date of Birth:  10-03-1950       BSA:          2.071 m Patient Age:    72 years        BP:           115/66 mmHg Patient Gender: M               HR:           65 bpm. [facility] Procedure: 2D Echo, Cardiac Doppler and Color Doppler Indications:    CHF  History:        Patient has prior history of Echocardiogram examinations, most                 recent 12/12/2022. COPD; Aortic Valve Disease.  Sonographer:    Darlys Gales  Final    >=100,000 COLONIES/mL ESCHERICHIA COLI Confirmed Extended Spectrum Beta-Lactamase Producer (ESBL).  In bloodstream infections from ESBL organisms, carbapenems are preferred over piperacillin/tazobactam. They are shown to have a lower risk of mortality.    Report Status 02/15/2023 FINAL  Final   Organism ID, Bacteria ESCHERICHIA COLI (A)  Final      Susceptibility   Escherichia coli - MIC*    AMPICILLIN >=32 RESISTANT Resistant     CEFAZOLIN >=64 RESISTANT Resistant     CEFEPIME >=32 RESISTANT Resistant     CEFTRIAXONE >=64 RESISTANT Resistant     CIPROFLOXACIN >=4 RESISTANT Resistant     GENTAMICIN <=1 SENSITIVE Sensitive     IMIPENEM <=0.25 SENSITIVE Sensitive     NITROFURANTOIN 128 RESISTANT Resistant     TRIMETH/SULFA <=20 SENSITIVE Sensitive     AMPICILLIN/SULBACTAM >=32 RESISTANT Resistant     PIP/TAZO 8 SENSITIVE Sensitive     * >=100,000 COLONIES/mL ESCHERICHIA COLI     Scheduled Meds:  apixaban  5 mg Oral BID   bisacodyl  10 mg Rectal Once   carvedilol  6.25 mg Oral BID WC   cholecalciferol  5,000 Units Oral Daily   ferrous sulfate  325 mg Oral Daily   levothyroxine  25 mcg Oral Q0600   losartan  25 mg Oral Daily   mometasone-formoterol  2 puff Inhalation BID   multivitamin with minerals  1 tablet Oral QHS   polyethylene glycol  17 g Oral BID   rosuvastatin  40 mg Oral Daily   senna-docusate  1 tablet Oral QHS   sertraline  150 mg Oral QHS   sulfamethoxazole-trimethoprim  1 tablet Oral Q12H   Continuous Infusions:   sodium chloride 50 mL/hr at 02/15/23 1601    Procedures/Studies: DG Abd 1 View  Result Date: 02/16/2023 CLINICAL DATA:  72 year old male history of emesis. EXAM: ABDOMEN - 1 VIEW COMPARISON:  Abdominal radiograph 02/08/2017. FINDINGS: Gas and stool are seen scattered throughout the colon extending to the level of the distal rectum. No pathologic distension of small bowel is noted. No gross evidence of pneumoperitoneum. Surgical clips project over the epigastric region. IMPRESSION: 1. Nonspecific, nonobstructive bowel gas pattern. 2. No pneumoperitoneum. Electronically Signed   By: Trudie Reed M.D.   On: 02/16/2023 07:24   ECHOCARDIOGRAM COMPLETE  Result Date: 02/14/2023    ECHOCARDIOGRAM REPORT   Patient Name:   BEKIM VENNE Date of Exam: 02/14/2023 Medical Rec #:  [id number]       Height:       73.5 in Accession #:    [id number]      Weight:       180.6 lb Date of Birth:  10-03-1950       BSA:          2.071 m Patient Age:    72 years        BP:           115/66 mmHg Patient Gender: M               HR:           65 bpm. [facility] Procedure: 2D Echo, Cardiac Doppler and Color Doppler Indications:    CHF  History:        Patient has prior history of Echocardiogram examinations, most                 recent 12/12/2022. COPD; Aortic Valve Disease.  Sonographer:    Darlys Gales  Final    >=100,000 COLONIES/mL ESCHERICHIA COLI Confirmed Extended Spectrum Beta-Lactamase Producer (ESBL).  In bloodstream infections from ESBL organisms, carbapenems are preferred over piperacillin/tazobactam. They are shown to have a lower risk of mortality.    Report Status 02/15/2023 FINAL  Final   Organism ID, Bacteria ESCHERICHIA COLI (A)  Final      Susceptibility   Escherichia coli - MIC*    AMPICILLIN >=32 RESISTANT Resistant     CEFAZOLIN >=64 RESISTANT Resistant     CEFEPIME >=32 RESISTANT Resistant     CEFTRIAXONE >=64 RESISTANT Resistant     CIPROFLOXACIN >=4 RESISTANT Resistant     GENTAMICIN <=1 SENSITIVE Sensitive     IMIPENEM <=0.25 SENSITIVE Sensitive     NITROFURANTOIN 128 RESISTANT Resistant     TRIMETH/SULFA <=20 SENSITIVE Sensitive     AMPICILLIN/SULBACTAM >=32 RESISTANT Resistant     PIP/TAZO 8 SENSITIVE Sensitive     * >=100,000 COLONIES/mL ESCHERICHIA COLI     Scheduled Meds:  apixaban  5 mg Oral BID   bisacodyl  10 mg Rectal Once   carvedilol  6.25 mg Oral BID WC   cholecalciferol  5,000 Units Oral Daily   ferrous sulfate  325 mg Oral Daily   levothyroxine  25 mcg Oral Q0600   losartan  25 mg Oral Daily   mometasone-formoterol  2 puff Inhalation BID   multivitamin with minerals  1 tablet Oral QHS   polyethylene glycol  17 g Oral BID   rosuvastatin  40 mg Oral Daily   senna-docusate  1 tablet Oral QHS   sertraline  150 mg Oral QHS   sulfamethoxazole-trimethoprim  1 tablet Oral Q12H   Continuous Infusions:   sodium chloride 50 mL/hr at 02/15/23 1601    Procedures/Studies: DG Abd 1 View  Result Date: 02/16/2023 CLINICAL DATA:  72 year old male history of emesis. EXAM: ABDOMEN - 1 VIEW COMPARISON:  Abdominal radiograph 02/08/2017. FINDINGS: Gas and stool are seen scattered throughout the colon extending to the level of the distal rectum. No pathologic distension of small bowel is noted. No gross evidence of pneumoperitoneum. Surgical clips project over the epigastric region. IMPRESSION: 1. Nonspecific, nonobstructive bowel gas pattern. 2. No pneumoperitoneum. Electronically Signed   By: Trudie Reed M.D.   On: 02/16/2023 07:24   ECHOCARDIOGRAM COMPLETE  Result Date: 02/14/2023    ECHOCARDIOGRAM REPORT   Patient Name:   BEKIM VENNE Date of Exam: 02/14/2023 Medical Rec #:  [id number]       Height:       73.5 in Accession #:    [id number]      Weight:       180.6 lb Date of Birth:  10-03-1950       BSA:          2.071 m Patient Age:    72 years        BP:           115/66 mmHg Patient Gender: M               HR:           65 bpm. [facility] Procedure: 2D Echo, Cardiac Doppler and Color Doppler Indications:    CHF  History:        Patient has prior history of Echocardiogram examinations, most                 recent 12/12/2022. COPD; Aortic Valve Disease.  Sonographer:    Darlys Gales  PROGRESS NOTE  [name] WUX:[id number] DOB: 1951-03-30 DOA: 02/13/2023 PCP: [name], MD  Brief History:  72 y.o. male with medical history significant of hypertension, atrial fibrillation on anticoagulation, TAVR, COPD, GERD, mild dementia, prior CVA x 2 who presents to the emergency department from home via EMS due to altered mental status which started yesterday. 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  Patient complained of subjective fever yesterday, wife activated EMS due to altered mental status was described as " slower than normal".  On arrival of EMS team, patient was said to have fever of 101F and was taken to the ED for further evaluation and management.  Patient was reported to be confused on arrival to the ED. he denies chest pain, shortness of breath, vomiting, diarrhea, abdominal pain.   ED Course:  In the emergency department, he was hemodynamically stable.  Workup in the ED showed WBC 26.7, hemoglobin 9.5, hematocrit 29.7, MCV 77.1, platelets 382.  BMP sodium 128, potassium 3.5, chloride 97, bicarb 21, blood glucose 118, BUN 16, creatinine 0.83, albumin 2.9, ammonia 12, lactic acid was normal, troponin x 2 was normal.  SARS coronavirus 2 was negative.  Blood culture pending.  CT chest abdomen and pelvis showed no acute findings within the chest, abdomen or pelvis.  No source of sepsis.  Marked increased colonic stool consistent with constipation.  No bowel obstruction or inflammation. CT head without contrast showed no acute intracranial abnormalities.  Chest x-ray showed patchy bibasilar consolidation, left greater than right, which could reflect infection or asymmetric edema.  Increased pulmonary vascular congestion.  Patient was empirically  started on ceftriaxone and azithromycin.  IV hydration was provided. Hospitalist was asked to admit patient for further evaluation and management.   Assessment/Plan:  Acute metabolic encephalopathy - improving -secondary to urinary tract infection -mental status improving -Continue supportive measures   ESBL E Coli UTI  -presented with Fever/leukocytosis/malaise/AMS -there was initial concern for reemergence of infective endocarditis but given his rapid response to treatment for UTI and given he was fully treated for endocarditis we will see how he responds to UTI treatment -IV meropenem ordered for ESBL treatment then changed to Bactrim (planning for 3 day treatment course) -follow blood cultures: neg to date -WBC trending up   Hyponatremia - improving  -suspect from dehydration/poor oral intake -restart IVF -recheck BMP in AM    Iron deficiency anemia  -continue daily iron supplement    Opioid Induced Constipation -dulcolax suppository ordered 9/15 -continue BID miralax treatment  -add nightly senna treatment - if this regimen is ineffective consider movantik - KUB 9/17 with persistent stool/gas   Chronic Pain / Opioid Dependence - lidocaine patch - resumed home morphine IR as needed   GAD - he is on alprazolam as needed    Dementia without behavior disturbance - delirium precautions   Essential hypertension  -carvedilol, losartan, add IV hydralazine PRN SBP>175     Hypokalemia - supplemental potassium ordered and repleted  - recheck BMP in AM    History of TAVR 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

## 2023-02-17 NOTE — Progress Notes (Signed)
Got patient up to chair. Patient had a very difficult time standing with walker, could not walk, just shuffled a little and then I pretty much had to lift him into the chair.

## 2023-02-17 NOTE — Progress Notes (Signed)
Physical Therapy Treatment Patient Details Name: [name] MRN: [id number] DOB: 05/07/1951 Today's Date: 02/17/2023   History of Present Illness [name] is a 72 y.o. male with medical history significant of hypertension, atrial fibrillation on anticoagulation, TAVR, COPD, GERD, mild dementia, prior CVA x 2 who presents to the emergency department from home via EMS due to altered mental status which started yesterday. On arrival of EMS team, patient was said to have fever of 101F and was taken to the ED for further evaluation and management.  Patient was reported to be confused on arrival to the ED. he denies chest pain, shortness of breath, vomiting, diarrhea, abdominal pain.    PT Comments  Pt laying in bed trying to use urinal for urine specimen, however having difficulty initiating urine flow.  Suggested sitting edge of bed and trying requiring min assist to complete this task as noted core weakness.  PT with difficulty establishing and maintaining seated balance initially.  Pt attempted for several minutes to void sitting edge of bed but was unsuccessful.  Stood with min assist with requested bed elevation and maintained steady standing balance using RW.  Pt then ambulated to door and back (approx 25 feet) with only one stagger he was able to re-establish balance using walker.  PT returned to bedside, attempted to void again unsuccessfully.  Min assist to return back to supine position.  Noted fatigue with completion of these tasks.     If plan is discharge home, recommend the following: A little help with walking and/or transfers;A little help with bathing/dressing/bathroom;Help with stairs or ramp for entrance;Assistance with cooking/housework     Equipment Recommendations  None recommended by PT       Precautions / Restrictions Precautions Precautions: Fall Restrictions Weight Bearing Restrictions: No     Mobility  Bed Mobility Overal bed mobility: Needs Assistance Bed  Mobility: Supine to Sit, Sit to Supine     Supine to sit: Min assist, HOB elevated     General bed mobility comments: increased time, labored movement    Transfers Overall transfer level: Needs assistance Equipment used: Rolling walker (2 wheels) Transfers: Sit to/from Stand Sit to Stand: Min assist   Step pivot transfers: Min assist       General transfer comment: elevates bed, slow    Ambulation/Gait Ambulation/Gait assistance: Min assist, Contact guard assist Gait Distance (Feet): 25 Feet Assistive device: Rolling walker (2 wheels) Gait Pattern/deviations: Decreased step length - left, Decreased step length - right, Decreased stride length, Trunk flexed Gait velocity: decreased Gait velocity interpretation: <1.31 ft/sec, indicative of household ambulator   General Gait Details: slow labored cadence without loss of balance, limited mostly due to c/o fatigue         Cognition Arousal: Alert Behavior During Therapy: WFL for tasks assessed/performed Overall Cognitive Status: Within Functional Limits for tasks assessed                                            PT Goals (current goals can now be found in the care plan section) Acute Rehab PT Goals Patient Stated Goal: return home with family to assist PT Goal Formulation: With patient Time For Goal Achievement: 02/19/23 Potential to Achieve Goals: Good Progress towards PT goals: Progressing toward goals    Frequency    Min 3X/week      PT Plan  Continue per  goal progression.    Co-evaluation PT/OT/SLP Co-Evaluation/Treatment: Yes Reason for Co-Treatment: Complexity of the patient's impairments (multi-system involvement);To address functional/ADL transfers PT goals addressed during session: Mobility/safety with mobility;Balance;Proper use of DME        AM-PAC PT "6 Clicks" Mobility   Outcome Measure  Help needed turning from your back to your side while in a flat bed without using  bedrails?: A Little Help needed moving from lying on your back to sitting on the side of a flat bed without using bedrails?: A Little Help needed moving to and from a bed to a chair (including a wheelchair)?: A Little Help needed standing up from a chair using your arms (e.g., wheelchair or bedside chair)?: A Little Help needed to walk in hospital room?: A Little Help needed climbing 3-5 steps with a railing? : A Lot 6 Click Score: 17    End of Session Equipment Utilized During Treatment: Gait belt Activity Tolerance: Patient tolerated treatment well;Patient limited by fatigue Patient left: in bed Nurse Communication: Mobility status PT Visit Diagnosis: Unsteadiness on feet (R26.81);Other abnormalities of gait and mobility (R26.89);Muscle weakness (generalized) (M62.81)     Time: 7628-3151 PT Time Calculation (min) (ACUTE ONLY): 13 min  Charges:    $Gait Training: 8-22 mins PT General Charges $$ ACUTE PT VISIT: 1 Visit                     Lurena Nida, PTA/CLT [facility] Ph: [phone]    Lurena Nida 02/17/2023, 3:20 PM

## 2023-02-18 DIAGNOSIS — K5909 Other constipation: Secondary | ICD-10-CM

## 2023-02-18 DIAGNOSIS — K5903 Drug induced constipation: Secondary | ICD-10-CM | POA: Diagnosis not present

## 2023-02-18 DIAGNOSIS — N3000 Acute cystitis without hematuria: Secondary | ICD-10-CM

## 2023-02-18 DIAGNOSIS — E871 Hypo-osmolality and hyponatremia: Secondary | ICD-10-CM | POA: Diagnosis not present

## 2023-02-18 DIAGNOSIS — G9341 Metabolic encephalopathy: Secondary | ICD-10-CM | POA: Diagnosis not present

## 2023-02-18 DIAGNOSIS — E039 Hypothyroidism, unspecified: Secondary | ICD-10-CM | POA: Diagnosis not present

## 2023-02-18 DIAGNOSIS — D509 Iron deficiency anemia, unspecified: Secondary | ICD-10-CM | POA: Diagnosis not present

## 2023-02-18 DIAGNOSIS — I4819 Other persistent atrial fibrillation: Secondary | ICD-10-CM | POA: Diagnosis not present

## 2023-02-18 LAB — CULTURE, BLOOD (ROUTINE X 2)
Culture: NO GROWTH
Culture: NO GROWTH
Special Requests: ADEQUATE

## 2023-02-18 LAB — BASIC METABOLIC PANEL
Anion gap: 9 (ref 5–15)
BUN: 13 mg/dL (ref 8–23)
CO2: 22 mmol/L (ref 22–32)
Calcium: 8.6 mg/dL — ABNORMAL LOW (ref 8.9–10.3)
Chloride: 98 mmol/L (ref 98–111)
Creatinine, Ser: 0.84 mg/dL (ref 0.61–1.24)
GFR, Estimated: >60 mL/min (ref >60)
Glucose, Bld: 93 mg/dL (ref 70–99)
Potassium: 3.9 mmol/L (ref 3.5–5.1)
Sodium: 129 mmol/L — ABNORMAL LOW (ref 135–145)

## 2023-02-18 LAB — CBC
HCT: 29.2 % — ABNORMAL LOW (ref 39.0–52.0)
Hemoglobin: 9.2 g/dL — ABNORMAL LOW (ref 13.0–17.0)
MCH: 24.5 pg — ABNORMAL LOW (ref 26.0–34.0)
MCHC: 31.5 g/dL (ref 30.0–36.0)
MCV: 77.7 fL — ABNORMAL LOW (ref 80.0–100.0)
Platelets: 415 10*3/uL — ABNORMAL HIGH (ref 150–400)
RBC: 3.76 MIL/uL — ABNORMAL LOW (ref 4.22–5.81)
RDW: 16.9 % — ABNORMAL HIGH (ref 11.5–15.5)
WBC: 18.7 10*3/uL — ABNORMAL HIGH (ref 4.0–10.5)
nRBC: 0 % (ref 0.0–0.2)

## 2023-02-18 LAB — MAGNESIUM: Magnesium: 1.8 mg/dL (ref 1.7–2.4)

## 2023-02-18 MED ORDER — PEG 3350-KCL-NA BICARB-NACL 420 G PO SOLR
4000.0000 mL | Freq: Once | ORAL | Status: AC
  Administered 2023-02-18: 4000 mL via ORAL

## 2023-02-18 MED ORDER — POTASSIUM CHLORIDE CRYS ER 20 MEQ PO TBCR
20.0000 meq | EXTENDED_RELEASE_TABLET | Freq: Once | ORAL | Status: AC
  Administered 2023-02-18: 20 meq via ORAL
  Filled 2023-02-18: qty 1

## 2023-02-18 MED ORDER — VITAMIN B-12 100 MCG PO TABS
500.0000 ug | ORAL_TABLET | Freq: Every day | ORAL | Status: DC
  Administered 2023-02-18 – 2023-02-19 (×2): 500 ug via ORAL
  Filled 2023-02-18 (×2): qty 5

## 2023-02-18 MED ORDER — FOSFOMYCIN TROMETHAMINE 3 G PO PACK
3.0000 g | PACK | Freq: Once | ORAL | Status: AC
  Administered 2023-02-18: 3 g via ORAL
  Filled 2023-02-18: qty 3

## 2023-02-18 MED ORDER — MAGNESIUM SULFATE 2 GM/50ML IV SOLN
2.0000 g | Freq: Once | INTRAVENOUS | Status: AC
  Administered 2023-02-18: 2 g via INTRAVENOUS
  Filled 2023-02-18: qty 50

## 2023-02-18 MED ORDER — CYANOCOBALAMIN 1000 MCG/ML IJ SOLN: 1000.0000 ug | Freq: Once | INTRAMUSCULAR | Status: DC

## 2023-02-18 MED ORDER — LINACLOTIDE 145 MCG PO CAPS
290.0000 ug | ORAL_CAPSULE | Freq: Every day | ORAL | Status: DC
  Administered 2023-02-18 – 2023-02-19 (×2): 290 ug via ORAL
  Filled 2023-02-18 (×2): qty 2

## 2023-02-18 MED ORDER — POLYETHYLENE GLYCOL 3350 17 G PO PACK
17.0000 g | PACK | Freq: Two times a day (BID) | ORAL | Status: DC
  Administered 2023-02-19: 17 g via ORAL
  Filled 2023-02-18: qty 1

## 2023-02-18 MED ORDER — BISACODYL 10 MG RE SUPP
10.0000 mg | Freq: Once | RECTAL | Status: AC
  Administered 2023-02-18: 10 mg via RECTAL
  Filled 2023-02-18: qty 1

## 2023-02-18 NOTE — Progress Notes (Addendum)
REDUCTION: This exam was performed according to the departmental dose-optimization program which includes automated exposure control, adjustment of the mA and/or kV according to patient size and/or use of iterative reconstruction technique. COMPARISON:  02/13/2023 FINDINGS: Lower chest: Trace left pleural effusion  with mild left basilar atelectasis. Hepatobiliary: Unenhanced liver is unable for calcified granulomata. Status post cholecystectomy. No intrahepatic or extrahepatic ductal dilatation. Pancreas: Within normal limits. Spleen: Within normal limits mid Adrenals/Urinary Tract: Adrenal glands are within normal limits Kidneys are within normal limits. No renal, ureteral, or bladder calculi. No hydronephrosis. Mildly thick-walled bladder. Stomach/Bowel: Stomach is within normal limits with surgical clips at the GE junction. No evidence of bowel obstruction. Appendix is not discretely visualized. Moderate to large colonic stool burden, suggesting constipation. No superimposed colonic wall thickening to suggest stercoral colitis. Vascular/Lymphatic: No evidence of abdominal aortic aneurysm. Atherosclerotic calcifications of the abdominal aorta and branch vessels. No suspicious abdominopelvic lymphadenopathy. Reproductive: Prostate is unremarkable. Other: No abdominopelvic ascites. Musculoskeletal: Mild degenerative changes of the visualized thoracolumbar spine. Schmorl's node deformities at T12-L1, unchanged. IMPRESSION: Trace left pleural effusion with mild left basilar atelectasis. Otherwise, no interval change from recent CT. Suspected constipation, without superimposed stercoral colitis. Mildly thick-walled bladder, correlate for cystitis. Electronically Signed   By: Charline Bills M.D.   On: 02/17/2023 17:24   DG Abd 1 View  Result Date: 02/16/2023 CLINICAL DATA:  72 year old male history of emesis. EXAM: ABDOMEN - 1 VIEW COMPARISON:  Abdominal radiograph 02/08/2017. FINDINGS: Gas and stool are seen scattered throughout the colon extending to the level of the distal rectum. No pathologic distension of small bowel is noted. No gross evidence of pneumoperitoneum. Surgical clips project over the epigastric region. IMPRESSION: 1. Nonspecific, nonobstructive bowel gas pattern. 2. No pneumoperitoneum. Electronically  Signed   By: Trudie Reed M.D.   On: 02/16/2023 07:24   ECHOCARDIOGRAM COMPLETE  Result Date: 02/14/2023    ECHOCARDIOGRAM REPORT   Patient Name:   [name] Date of Exam: 02/14/2023 Medical Rec #:  [id number]       Height:       73.5 in Accession #:    [id number]      Weight:       180.6 lb Date of Birth:  Aug 28, 1950       BSA:          2.071 m Patient Age:    72 years        BP:           115/66 mmHg Patient Gender: M               HR:           65 bpm. [facility] Procedure: 2D Echo, Cardiac Doppler and Color Doppler Indications:    CHF  History:        Patient has prior history of Echocardiogram examinations, most                 recent 12/12/2022. COPD; Aortic Valve Disease.  Sonographer:    Darlys Gales Referring Phys: [id number] OLADAPO ADEFESO IMPRESSIONS  1. Left ventricular ejection fraction, by estimation, is 55 to 60%. The left ventricle has normal function. The left ventricle has no regional wall motion abnormalities.  2. Right ventricular systolic function is normal. The right ventricular size is normal.  3. Left atrial size was mildly dilated.  4. The mitral valve is degenerative. Mild to moderate mitral valve regurgitation. Mild mitral stenosis. The mean mitral valve  PROGRESS NOTE  [name] UJW:[id number] DOB: [birth year] DOA: 02/13/2023 PCP: [name], MD  Brief History:  72 y.o. male with medical history significant of hypertension, atrial fibrillation on anticoagulation, TAVR, COPD, GERD, mild dementia, prior CVA x 2 who presents to the emergency department from home via EMS due to altered mental status which started yesterday. 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  Patient complained of subjective fever yesterday, wife activated EMS due to altered mental status was described as " slower than normal".  On arrival of EMS team, patient was said to have fever of 101F and was taken to the ED for further evaluation and management.  Patient was reported to be confused on arrival to the ED. he denies chest pain, shortness of breath, vomiting, diarrhea, abdominal pain.   ED Course:  In the emergency department, he was hemodynamically stable.  Workup in the ED showed WBC 26.7, hemoglobin 9.5, hematocrit 29.7, MCV 77.1, platelets 382.  BMP sodium 128, potassium 3.5, chloride 97, bicarb 21, blood glucose 118, BUN 16, creatinine 0.83, albumin 2.9, ammonia 12, lactic acid was normal, troponin x 2 was normal.  SARS coronavirus 2 was negative.  Blood culture pending.  CT chest abdomen and pelvis showed no acute findings within the chest, abdomen or pelvis.  No source of sepsis.  Marked increased colonic stool consistent with constipation.  No bowel obstruction or inflammation. CT head without contrast showed no acute intracranial abnormalities.  Chest x-ray showed patchy bibasilar consolidation, left greater than right, which could reflect infection or asymmetric edema.  Increased pulmonary vascular congestion.  Patient was empirically  started on ceftriaxone and azithromycin.  IV hydration was provided. Hospitalist was asked to admit patient for further evaluation and management.   Assessment/Plan:  Acute metabolic encephalopathy - improving -secondary to urinary tract infection -mental status improving -Continue supportive measures -B12--235>>give IM B12   ESBL E Coli UTI  -presented with Fever/leukocytosis/malaise/AMS -there was initial concern for reemergence of infective endocarditis but given his rapid response to treatment for UTI and given he was fully treated for endocarditis we will see how he responds to UTI treatment -IV meropenem ordered for ESBL treatment then changed to Bactrim (3 day treatment course) -follow blood cultures: neg to date -WBC trending down again   Hyponatremia - improving  -suspect from dehydration/poor oral intake -restart IVF>>improving -recheck BMP in AM    Iron deficiency anemia  -continue daily iron supplement    Opioid Induced Constipation -dulcolax suppository ordered 9/15--pt refused -continue BID miralax treatment  -add nightly senna treatment - 9/18 repeat CT abd--Moderate to large colonic stool burden, suggesting constipation. No superimposed colonic wall thickening to suggest stercoral colitis -TSH 1.792 -appreciate GI -continue miralax.  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 COMPARISON:  02/13/2023 FINDINGS: Lower chest: Trace left pleural effusion  with mild left basilar atelectasis. Hepatobiliary: Unenhanced liver is unable for calcified granulomata. Status post cholecystectomy. No intrahepatic or extrahepatic ductal dilatation. Pancreas: Within normal limits. Spleen: Within normal limits mid Adrenals/Urinary Tract: Adrenal glands are within normal limits Kidneys are within normal limits. No renal, ureteral, or bladder calculi. No hydronephrosis. Mildly thick-walled bladder. Stomach/Bowel: Stomach is within normal limits with surgical clips at the GE junction. No evidence of bowel obstruction. Appendix is not discretely visualized. Moderate to large colonic stool burden, suggesting constipation. No superimposed colonic wall thickening to suggest stercoral colitis. Vascular/Lymphatic: No evidence of abdominal aortic aneurysm. Atherosclerotic calcifications of the abdominal aorta and branch vessels. No suspicious abdominopelvic lymphadenopathy. Reproductive: Prostate is unremarkable. Other: No abdominopelvic ascites. Musculoskeletal: Mild degenerative changes of the visualized thoracolumbar spine. Schmorl's node deformities at T12-L1, unchanged. IMPRESSION: Trace left pleural effusion with mild left basilar atelectasis. Otherwise, no interval change from recent CT. Suspected constipation, without superimposed stercoral colitis. Mildly thick-walled bladder, correlate for cystitis. Electronically Signed   By: Charline Bills M.D.   On: 02/17/2023 17:24   DG Abd 1 View  Result Date: 02/16/2023 CLINICAL DATA:  72 year old male history of emesis. EXAM: ABDOMEN - 1 VIEW COMPARISON:  Abdominal radiograph 02/08/2017. FINDINGS: Gas and stool are seen scattered throughout the colon extending to the level of the distal rectum. No pathologic distension of small bowel is noted. No gross evidence of pneumoperitoneum. Surgical clips project over the epigastric region. IMPRESSION: 1. Nonspecific, nonobstructive bowel gas pattern. 2. No pneumoperitoneum. Electronically  Signed   By: Trudie Reed M.D.   On: 02/16/2023 07:24   ECHOCARDIOGRAM COMPLETE  Result Date: 02/14/2023    ECHOCARDIOGRAM REPORT   Patient Name:   [name] Date of Exam: 02/14/2023 Medical Rec #:  [id number]       Height:       73.5 in Accession #:    [id number]      Weight:       180.6 lb Date of Birth:  Aug 28, 1950       BSA:          2.071 m Patient Age:    72 years        BP:           115/66 mmHg Patient Gender: M               HR:           65 bpm. [facility] Location[facility] Procedure: 2D Echo, Cardiac Doppler and Color Doppler Indications:    CHF  History:        Patient has prior history of Echocardiogram examinations, most                 recent 12/12/2022. COPD; Aortic Valve Disease.  Sonographer:    Darlys Gales Referring Phys: [id number] OLADAPO ADEFESO IMPRESSIONS  1. Left ventricular ejection fraction, by estimation, is 55 to 60%. The left ventricle has normal function. The left ventricle has no regional wall motion abnormalities.  2. Right ventricular systolic function is normal. The right ventricular size is normal.  3. Left atrial size was mildly dilated.  4. The mitral valve is degenerative. Mild to moderate mitral valve regurgitation. Mild mitral stenosis. The mean mitral valve  PROGRESS NOTE  [name] UJW:[id number] DOB: [birth year] DOA: 02/13/2023 PCP: [name], MD  Brief History:  72 y.o. male with medical history significant of hypertension, atrial fibrillation on anticoagulation, TAVR, COPD, GERD, mild dementia, prior CVA x 2 who presents to the emergency department from home via EMS due to altered mental status which started yesterday. 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  Patient complained of subjective fever yesterday, wife activated EMS due to altered mental status was described as " slower than normal".  On arrival of EMS team, patient was said to have fever of 101F and was taken to the ED for further evaluation and management.  Patient was reported to be confused on arrival to the ED. he denies chest pain, shortness of breath, vomiting, diarrhea, abdominal pain.   ED Course:  In the emergency department, he was hemodynamically stable.  Workup in the ED showed WBC 26.7, hemoglobin 9.5, hematocrit 29.7, MCV 77.1, platelets 382.  BMP sodium 128, potassium 3.5, chloride 97, bicarb 21, blood glucose 118, BUN 16, creatinine 0.83, albumin 2.9, ammonia 12, lactic acid was normal, troponin x 2 was normal.  SARS coronavirus 2 was negative.  Blood culture pending.  CT chest abdomen and pelvis showed no acute findings within the chest, abdomen or pelvis.  No source of sepsis.  Marked increased colonic stool consistent with constipation.  No bowel obstruction or inflammation. CT head without contrast showed no acute intracranial abnormalities.  Chest x-ray showed patchy bibasilar consolidation, left greater than right, which could reflect infection or asymmetric edema.  Increased pulmonary vascular congestion.  Patient was empirically  started on ceftriaxone and azithromycin.  IV hydration was provided. Hospitalist was asked to admit patient for further evaluation and management.   Assessment/Plan:  Acute metabolic encephalopathy - improving -secondary to urinary tract infection -mental status improving -Continue supportive measures -B12--235>>give IM B12   ESBL E Coli UTI  -presented with Fever/leukocytosis/malaise/AMS -there was initial concern for reemergence of infective endocarditis but given his rapid response to treatment for UTI and given he was fully treated for endocarditis we will see how he responds to UTI treatment -IV meropenem ordered for ESBL treatment then changed to Bactrim (3 day treatment course) -follow blood cultures: neg to date -WBC trending down again   Hyponatremia - improving  -suspect from dehydration/poor oral intake -restart IVF>>improving -recheck BMP in AM    Iron deficiency anemia  -continue daily iron supplement    Opioid Induced Constipation -dulcolax suppository ordered 9/15--pt refused -continue BID miralax treatment  -add nightly senna treatment - 9/18 repeat CT abd--Moderate to large colonic stool burden, suggesting constipation. No superimposed colonic wall thickening to suggest stercoral colitis -TSH 1.792 -appreciate GI -continue miralax.  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 COMPARISON:  02/13/2023 FINDINGS: Lower chest: Trace left pleural effusion  with mild left basilar atelectasis. Hepatobiliary: Unenhanced liver is unable for calcified granulomata. Status post cholecystectomy. No intrahepatic or extrahepatic ductal dilatation. Pancreas: Within normal limits. Spleen: Within normal limits mid Adrenals/Urinary Tract: Adrenal glands are within normal limits Kidneys are within normal limits. No renal, ureteral, or bladder calculi. No hydronephrosis. Mildly thick-walled bladder. Stomach/Bowel: Stomach is within normal limits with surgical clips at the GE junction. No evidence of bowel obstruction. Appendix is not discretely visualized. Moderate to large colonic stool burden, suggesting constipation. No superimposed colonic wall thickening to suggest stercoral colitis. Vascular/Lymphatic: No evidence of abdominal aortic aneurysm. Atherosclerotic calcifications of the abdominal aorta and branch vessels. No suspicious abdominopelvic lymphadenopathy. Reproductive: Prostate is unremarkable. Other: No abdominopelvic ascites. Musculoskeletal: Mild degenerative changes of the visualized thoracolumbar spine. Schmorl's node deformities at T12-L1, unchanged. IMPRESSION: Trace left pleural effusion with mild left basilar atelectasis. Otherwise, no interval change from recent CT. Suspected constipation, without superimposed stercoral colitis. Mildly thick-walled bladder, correlate for cystitis. Electronically Signed   By: Charline Bills M.D.   On: 02/17/2023 17:24   DG Abd 1 View  Result Date: 02/16/2023 CLINICAL DATA:  72 year old male history of emesis. EXAM: ABDOMEN - 1 VIEW COMPARISON:  Abdominal radiograph 02/08/2017. FINDINGS: Gas and stool are seen scattered throughout the colon extending to the level of the distal rectum. No pathologic distension of small bowel is noted. No gross evidence of pneumoperitoneum. Surgical clips project over the epigastric region. IMPRESSION: 1. Nonspecific, nonobstructive bowel gas pattern. 2. No pneumoperitoneum. Electronically  Signed   By: Trudie Reed M.D.   On: 02/16/2023 07:24   ECHOCARDIOGRAM COMPLETE  Result Date: 02/14/2023    ECHOCARDIOGRAM REPORT   Patient Name:   [name] Date of Exam: 02/14/2023 Medical Rec #:  [id number]       Height:       73.5 in Accession #:    [id number]      Weight:       180.6 lb Date of Birth:  Aug 28, 1950       BSA:          2.071 m Patient Age:    72 years        BP:           115/66 mmHg Patient Gender: M               HR:           65 bpm. [facility] Location[facility] Procedure: 2D Echo, Cardiac Doppler and Color Doppler Indications:    CHF  History:        Patient has prior history of Echocardiogram examinations, most                 recent 12/12/2022. COPD; Aortic Valve Disease.  Sonographer:    Darlys Gales Referring Phys: [id number] OLADAPO ADEFESO IMPRESSIONS  1. Left ventricular ejection fraction, by estimation, is 55 to 60%. The left ventricle has normal function. The left ventricle has no regional wall motion abnormalities.  2. Right ventricular systolic function is normal. The right ventricular size is normal.  3. Left atrial size was mildly dilated.  4. The mitral valve is degenerative. Mild to moderate mitral valve regurgitation. Mild mitral stenosis. The mean mitral valve  REDUCTION: This exam was performed according to the departmental dose-optimization program which includes automated exposure control, adjustment of the mA and/or kV according to patient size and/or use of iterative reconstruction technique. COMPARISON:  02/13/2023 FINDINGS: Lower chest: Trace left pleural effusion  with mild left basilar atelectasis. Hepatobiliary: Unenhanced liver is unable for calcified granulomata. Status post cholecystectomy. No intrahepatic or extrahepatic ductal dilatation. Pancreas: Within normal limits. Spleen: Within normal limits mid Adrenals/Urinary Tract: Adrenal glands are within normal limits Kidneys are within normal limits. No renal, ureteral, or bladder calculi. No hydronephrosis. Mildly thick-walled bladder. Stomach/Bowel: Stomach is within normal limits with surgical clips at the GE junction. No evidence of bowel obstruction. Appendix is not discretely visualized. Moderate to large colonic stool burden, suggesting constipation. No superimposed colonic wall thickening to suggest stercoral colitis. Vascular/Lymphatic: No evidence of abdominal aortic aneurysm. Atherosclerotic calcifications of the abdominal aorta and branch vessels. No suspicious abdominopelvic lymphadenopathy. Reproductive: Prostate is unremarkable. Other: No abdominopelvic ascites. Musculoskeletal: Mild degenerative changes of the visualized thoracolumbar spine. Schmorl's node deformities at T12-L1, unchanged. IMPRESSION: Trace left pleural effusion with mild left basilar atelectasis. Otherwise, no interval change from recent CT. Suspected constipation, without superimposed stercoral colitis. Mildly thick-walled bladder, correlate for cystitis. Electronically Signed   By: Charline Bills M.D.   On: 02/17/2023 17:24   DG Abd 1 View  Result Date: 02/16/2023 CLINICAL DATA:  72 year old male history of emesis. EXAM: ABDOMEN - 1 VIEW COMPARISON:  Abdominal radiograph 02/08/2017. FINDINGS: Gas and stool are seen scattered throughout the colon extending to the level of the distal rectum. No pathologic distension of small bowel is noted. No gross evidence of pneumoperitoneum. Surgical clips project over the epigastric region. IMPRESSION: 1. Nonspecific, nonobstructive bowel gas pattern. 2. No pneumoperitoneum. Electronically  Signed   By: Trudie Reed M.D.   On: 02/16/2023 07:24   ECHOCARDIOGRAM COMPLETE  Result Date: 02/14/2023    ECHOCARDIOGRAM REPORT   Patient Name:   [name] Date of Exam: 02/14/2023 Medical Rec #:  [id number]       Height:       73.5 in Accession #:    [id number]      Weight:       180.6 lb Date of Birth:  Aug 28, 1950       BSA:          2.071 m Patient Age:    72 years        BP:           115/66 mmHg Patient Gender: M               HR:           65 bpm. [facility] Procedure: 2D Echo, Cardiac Doppler and Color Doppler Indications:    CHF  History:        Patient has prior history of Echocardiogram examinations, most                 recent 12/12/2022. COPD; Aortic Valve Disease.  Sonographer:    Darlys Gales Referring Phys: [id number] OLADAPO ADEFESO IMPRESSIONS  1. Left ventricular ejection fraction, by estimation, is 55 to 60%. The left ventricle has normal function. The left ventricle has no regional wall motion abnormalities.  2. Right ventricular systolic function is normal. The right ventricular size is normal.  3. Left atrial size was mildly dilated.  4. The mitral valve is degenerative. Mild to moderate mitral valve regurgitation. Mild mitral stenosis. The mean mitral valve  PROGRESS NOTE  [name] UJW:[id number] DOB: [birth year] DOA: 02/13/2023 PCP: [name], MD  Brief History:  72 y.o. male with medical history significant of hypertension, atrial fibrillation on anticoagulation, TAVR, COPD, GERD, mild dementia, prior CVA x 2 who presents to the emergency department from home via EMS due to altered mental status which started yesterday. 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  Patient complained of subjective fever yesterday, wife activated EMS due to altered mental status was described as " slower than normal".  On arrival of EMS team, patient was said to have fever of 101F and was taken to the ED for further evaluation and management.  Patient was reported to be confused on arrival to the ED. he denies chest pain, shortness of breath, vomiting, diarrhea, abdominal pain.   ED Course:  In the emergency department, he was hemodynamically stable.  Workup in the ED showed WBC 26.7, hemoglobin 9.5, hematocrit 29.7, MCV 77.1, platelets 382.  BMP sodium 128, potassium 3.5, chloride 97, bicarb 21, blood glucose 118, BUN 16, creatinine 0.83, albumin 2.9, ammonia 12, lactic acid was normal, troponin x 2 was normal.  SARS coronavirus 2 was negative.  Blood culture pending.  CT chest abdomen and pelvis showed no acute findings within the chest, abdomen or pelvis.  No source of sepsis.  Marked increased colonic stool consistent with constipation.  No bowel obstruction or inflammation. CT head without contrast showed no acute intracranial abnormalities.  Chest x-ray showed patchy bibasilar consolidation, left greater than right, which could reflect infection or asymmetric edema.  Increased pulmonary vascular congestion.  Patient was empirically  started on ceftriaxone and azithromycin.  IV hydration was provided. Hospitalist was asked to admit patient for further evaluation and management.   Assessment/Plan:  Acute metabolic encephalopathy - improving -secondary to urinary tract infection -mental status improving -Continue supportive measures -B12--235>>give IM B12   ESBL E Coli UTI  -presented with Fever/leukocytosis/malaise/AMS -there was initial concern for reemergence of infective endocarditis but given his rapid response to treatment for UTI and given he was fully treated for endocarditis we will see how he responds to UTI treatment -IV meropenem ordered for ESBL treatment then changed to Bactrim (3 day treatment course) -follow blood cultures: neg to date -WBC trending down again   Hyponatremia - improving  -suspect from dehydration/poor oral intake -restart IVF>>improving -recheck BMP in AM    Iron deficiency anemia  -continue daily iron supplement    Opioid Induced Constipation -dulcolax suppository ordered 9/15--pt refused -continue BID miralax treatment  -add nightly senna treatment - 9/18 repeat CT abd--Moderate to large colonic stool burden, suggesting constipation. No superimposed colonic wall thickening to suggest stercoral colitis -TSH 1.792 -appreciate GI -continue miralax.  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 COMPARISON:  02/13/2023 FINDINGS: Lower chest: Trace left pleural effusion  with mild left basilar atelectasis. Hepatobiliary: Unenhanced liver is unable for calcified granulomata. Status post cholecystectomy. No intrahepatic or extrahepatic ductal dilatation. Pancreas: Within normal limits. Spleen: Within normal limits mid Adrenals/Urinary Tract: Adrenal glands are within normal limits Kidneys are within normal limits. No renal, ureteral, or bladder calculi. No hydronephrosis. Mildly thick-walled bladder. Stomach/Bowel: Stomach is within normal limits with surgical clips at the GE junction. No evidence of bowel obstruction. Appendix is not discretely visualized. Moderate to large colonic stool burden, suggesting constipation. No superimposed colonic wall thickening to suggest stercoral colitis. Vascular/Lymphatic: No evidence of abdominal aortic aneurysm. Atherosclerotic calcifications of the abdominal aorta and branch vessels. No suspicious abdominopelvic lymphadenopathy. Reproductive: Prostate is unremarkable. Other: No abdominopelvic ascites. Musculoskeletal: Mild degenerative changes of the visualized thoracolumbar spine. Schmorl's node deformities at T12-L1, unchanged. IMPRESSION: Trace left pleural effusion with mild left basilar atelectasis. Otherwise, no interval change from recent CT. Suspected constipation, without superimposed stercoral colitis. Mildly thick-walled bladder, correlate for cystitis. Electronically Signed   By: Charline Bills M.D.   On: 02/17/2023 17:24   DG Abd 1 View  Result Date: 02/16/2023 CLINICAL DATA:  72 year old male history of emesis. EXAM: ABDOMEN - 1 VIEW COMPARISON:  Abdominal radiograph 02/08/2017. FINDINGS: Gas and stool are seen scattered throughout the colon extending to the level of the distal rectum. No pathologic distension of small bowel is noted. No gross evidence of pneumoperitoneum. Surgical clips project over the epigastric region. IMPRESSION: 1. Nonspecific, nonobstructive bowel gas pattern. 2. No pneumoperitoneum. Electronically  Signed   By: Trudie Reed M.D.   On: 02/16/2023 07:24   ECHOCARDIOGRAM COMPLETE  Result Date: 02/14/2023    ECHOCARDIOGRAM REPORT   Patient Name:   [name] Date of Exam: 02/14/2023 Medical Rec #:  [id number]       Height:       73.5 in Accession #:    [id number]      Weight:       180.6 lb Date of Birth:  Aug 28, 1950       BSA:          2.071 m Patient Age:    72 years        BP:           115/66 mmHg Patient Gender: M               HR:           65 bpm. [facility] Procedure: 2D Echo, Cardiac Doppler and Color Doppler Indications:    CHF  History:        Patient has prior history of Echocardiogram examinations, most                 recent 12/12/2022. COPD; Aortic Valve Disease.  Sonographer:    Darlys Gales Referring Phys: [id number] OLADAPO ADEFESO IMPRESSIONS  1. Left ventricular ejection fraction, by estimation, is 55 to 60%. The left ventricle has normal function. The left ventricle has no regional wall motion abnormalities.  2. Right ventricular systolic function is normal. The right ventricular size is normal.  3. Left atrial size was mildly dilated.  4. The mitral valve is degenerative. Mild to moderate mitral valve regurgitation. Mild mitral stenosis. The mean mitral valve

## 2023-02-18 NOTE — Progress Notes (Signed)
Physical Therapy Treatment Patient Details Name: [name] MRN: [id number] DOB: [birth year] Today's Date: 02/18/2023   History of Present Illness [name] is a 72 y.o. male with medical history significant of hypertension, atrial fibrillation on anticoagulation, TAVR, COPD, GERD, mild dementia, prior CVA x 2 who presents to the emergency department from home via EMS due to altered mental status which started yesterday. On arrival of EMS team, patient was said to have fever of 101F and was taken to the ED for further evaluation and management.  Patient was reported to be confused on arrival to the ED. he denies chest pain, shortness of breath, vomiting, diarrhea, abdominal pain.    PT Comments  Pt sitting in bed, agreeable to participate with therapy.  Pt with more difficulty with bed mobility/sitting balance than with actual ambulation.  Tends to go into extension despite cues/education to keep "nose over toes"  unable to ambulate further than to the door and back due to fatigue.  Pt is limited by general deconditioning and fatigue.  LE exercises completed sitting in chair.  Pt overall tolerated well.   If plan is discharge home, recommend the following: A little help with walking and/or transfers;A little help with bathing/dressing/bathroom;Help with stairs or ramp for entrance;Assistance with cooking/housework     Equipment Recommendations  None recommended by PT       Precautions / Restrictions Precautions Precautions: Fall Restrictions Weight Bearing Restrictions: No     Mobility  Bed Mobility Overal bed mobility: Needs Assistance Bed Mobility: Supine to Sit, Sit to Supine     Supine to sit: Min assist, HOB elevated     General bed mobility comments: increased time, labored movement    Transfers Overall transfer level: Needs assistance Equipment used: Rolling walker (2 wheels) Transfers: Sit to/from Stand Sit to Stand: Min assist   Step pivot transfers: Min  assist       General transfer comment: elevates bed, slow    Ambulation/Gait Ambulation/Gait assistance: Min assist, Contact guard assist Gait Distance (Feet): 25 Feet Assistive device: Rolling walker (2 wheels) Gait Pattern/deviations: Decreased step length - left, Decreased step length - right, Decreased stride length, Trunk flexed Gait velocity: decreased     General Gait Details: slow labored cadence without loss of balance, limited mostly due to c/o fatigue         Cognition Arousal: Alert Behavior During Therapy: WFL for tasks assessed/performed Overall Cognitive Status: Within Functional Limits for tasks assessed                                          Exercises General Exercises - Lower Extremity Ankle Circles/Pumps: AROM, Both, Seated, 10 reps Long Arc Quad: AROM, Seated, Both, 10 reps Hip Flexion/Marching: AROM, Seated, Both, 10 reps      PT Goals (current goals can now be found in the care plan section) Acute Rehab PT Goals Patient Stated Goal: return home with family to assist PT Goal Formulation: With patient Time For Goal Achievement: 02/19/23 Potential to Achieve Goals: Good Progress towards PT goals: Progressing toward goals    Frequency    Min 3X/week       Co-evaluation PT/OT/SLP Co-Evaluation/Treatment: Yes Reason for Co-Treatment: Complexity of the patient's impairments (multi-system involvement);To address functional/ADL transfers PT goals addressed during session: Mobility/safety with mobility;Balance;Proper use of DME        AM-PAC PT "6 Clicks"  Mobility   Outcome Measure  Help needed turning from your back to your side while in a flat bed without using bedrails?: A Little Help needed moving from lying on your back to sitting on the side of a flat bed without using bedrails?: A Little Help needed moving to and from a bed to a chair (including a wheelchair)?: A Little Help needed standing up from a chair using  your arms (e.g., wheelchair or bedside chair)?: A Little Help needed to walk in hospital room?: A Little Help needed climbing 3-5 steps with a railing? : A Lot 6 Click Score: 17    End of Session Equipment Utilized During Treatment: Gait belt Activity Tolerance: Patient tolerated treatment well;Patient limited by fatigue Patient left: in chair;with chair alarm set Nurse Communication: Mobility status PT Visit Diagnosis: Unsteadiness on feet (R26.81);Other abnormalities of gait and mobility (R26.89);Muscle weakness (generalized) (M62.81)     Time: 8295-6213 PT Time Calculation (min) (ACUTE ONLY): 23 min  Charges:    $Gait Training: 8-22 mins $Therapeutic Exercise: 8-22 mins PT General Charges $$ ACUTE PT VISIT: 1 Visit                      Emeline Gins B 02/18/2023, 8:40 AM

## 2023-02-18 NOTE — Consult Note (Signed)
Gastroenterology Consult   Referring Provider: No ref. provider found Primary Care Physician:  [name], MD Primary Gastroenterologist:  remotely saw Dr. Arlyce Dice for years  Patient ID: [name]; [id number]; Oct 23, 1950   Admit date: 02/13/2023  LOS: 5 days   Date of Consultation: 02/18/2023  Reason for Consultation:  constipation/obstipation    History of Present Illness   [name] is a 72 y.o. male with past medical history significant for hypertension, A-fib on anticoagulation, TAVR, COPD, GERD, mild dementia, prior strokes who was admitted with altered mental status and fever.  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  This admission he was noted to have UTI, ESBL E. coli.  Initially his sodium was 128, chronically with hyponatremia.  Corrected up to 132 but back down to 129 today.  Hemoglobin 9.5 on admission and has remained stable.  Ferritin 284 similar to 2 months ago, iron sat 9%, iron 20, TIBC 218.  B12 235.  Folate 9.5.  TSH 1.792.  Initial blood culture negative.  Repeat blood culture yesterday remains no growth.  White blood cell count initially 26,700, improved down to 15,102 and now back to 18,700.  Today magnesium 1.8, potassium 3.9, creatinine 0.84.  Day of admission CT chest/abdomen/pelvis showed stable aortic valve replacement.  Colon distended with marked increased stool burden.  Right colon measuring 9.6 cm in diameter.  No colonic wall thickening or inflammation.  Stool moderately distends the rectum.  Follow-up abdominal x-ray September 17 with no pneumoperitoneum.  Nonspecific nonobstructive bowel gas pattern.  Repeat CT abdomen pelvis without contrast yesterday showed moderate to large colonic stool burden suggesting constipation, superimposed colonic wall thickening to  suggest or coral colitis.    Bisacodyl suppository ordered for September 15 but not given.  MiraLAX 17 g daily given on the 15th, 16th.  Received 2 doses on the 17th and 18th.  Senokot S1 tablet provide of the 17th and 18th.   Colonoscopy 2011, normal study, recommended surveillance colonoscopy 7 years due to previous adenomatous colon polyps in 2001. Patient states that he had a colonoscopy since then but he does not remember when, where or what was found.  EGD 2007: Billroth I anatomy, distal esophageal stricture status post balloon dilation.  Consult: Patient states at baseline he has chronic constipation.  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  States he has had at least 8 revisions for bowel obstructions, approximately 10+ years ago.  At baseline he has chronic constipation.  Uses morphine as needed for back pain tries not to use daily.  States he never gets an urge to have a bowel movement, he has to monitor for lower abdominal fullness and stay on top of things with use of daily stool softener, MiraLAX every other day.  States he has had some small bowel movements since admission.  Denies any melena or rectal bleeding.  Typically does not have to use suppositories or enema at home.  Occasional abdominal pain but not often.  States he has been on Linzess in the past which seemed to work, sometimes difficult to get medications covered by the [place].  Over the past year he reports losing about 80 pounds, during that time he had COVID, strokes endocarditis, multiple admissions.  Denies any heartburn or dysphagia.   Prior to Admission medications  colon extending to the level of the distal rectum. No pathologic distension of small bowel is noted. No gross evidence of pneumoperitoneum. Surgical clips project over the epigastric region. IMPRESSION: 1. Nonspecific, nonobstructive bowel gas pattern. 2. No pneumoperitoneum. Electronically Signed   By: Trudie Reed M.D.   On: 02/16/2023 07:24   ECHOCARDIOGRAM COMPLETE  Result Date: 02/14/2023    ECHOCARDIOGRAM REPORT   Patient Name:   [name] Date of Exam: 02/14/2023 Medical Rec #:  [id number]       Height:       73.5 in Accession #:    [id number]      Weight:       180.6 lb Date of Birth:  06/13/1950       BSA:          2.071 m Patient Age:    72 years        BP:           115/66 mmHg Patient Gender: M               HR:           65 bpm. [facility] Procedure: 2D Echo, Cardiac Doppler and Color Doppler Indications:    CHF  History:        Patient has prior history of Echocardiogram examinations, most                 recent 12/12/2022. COPD; Aortic Valve Disease.  Sonographer:    Darlys Gales Referring Phys: [id number] OLADAPO ADEFESO IMPRESSIONS  1. Left ventricular ejection fraction, by estimation, is 55 to 60%. The left ventricle has normal function. The left ventricle has no regional wall motion abnormalities.  2. Right ventricular systolic function is normal. The right ventricular size is normal.  3. Left atrial size was mildly dilated.  4. The mitral valve is degenerative. Mild to moderate mitral valve regurgitation. Mild mitral stenosis. The mean mitral valve gradient is 4.7 mmHg. Moderate mitral annular calcification.  5. The aortic valve is calcified. Aortic valve regurgitation is not visualized. Mild to moderate aortic valve stenosis. Aortic valve area, by VTI measures 1.77 cm. Aortic valve mean gradient measures  14.5 mmHg. Aortic valve Vmax measures 2.63 m/s.  6. The inferior vena cava is normal in size with greater than 50% respiratory variability, suggesting right atrial pressure of 3 mmHg. FINDINGS  Left Ventricle: Left ventricular ejection fraction, by estimation, is 55 to 60%. The left ventricle has normal function. The left ventricle has no regional wall motion abnormalities. The left ventricular internal cavity size was normal in size. There is  no left ventricular hypertrophy. Left ventricular diastolic function could not be evaluated due to mitral annular calcification (moderate or greater). Right Ventricle: The right ventricular size is normal. No increase in right ventricular wall thickness. Right ventricular systolic function is normal. Left Atrium: Left atrial size was mildly dilated. Right Atrium: Right atrial size was normal in size. Pericardium: There is no evidence of pericardial effusion. Mitral Valve: The mitral valve is degenerative in appearance. Moderate mitral annular calcification. Mild to moderate mitral valve regurgitation. Mild mitral valve stenosis. MV peak gradient, 12.0 mmHg. The mean mitral valve gradient is 4.7 mmHg. Tricuspid Valve: The tricuspid valve is normal in structure. Tricuspid valve regurgitation is not demonstrated. No evidence of tricuspid stenosis. Aortic Valve: The aortic valve is calcified. Aortic valve regurgitation is not visualized. Mild to moderate aortic stenosis is present. Aortic valve mean gradient measures 14.5  Gastroenterology Consult   Referring Provider: No ref. provider found Primary Care Physician:  [name], MD Primary Gastroenterologist:  remotely saw Dr. Arlyce Dice for years  Patient ID: [name]; [id number]; Oct 23, 1950   Admit date: 02/13/2023  LOS: 5 days   Date of Consultation: 02/18/2023  Reason for Consultation:  constipation/obstipation    History of Present Illness   [name] is a 72 y.o. male with past medical history significant for hypertension, A-fib on anticoagulation, TAVR, COPD, GERD, mild dementia, prior strokes who was admitted with altered mental status and fever.  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  This admission he was noted to have UTI, ESBL E. coli.  Initially his sodium was 128, chronically with hyponatremia.  Corrected up to 132 but back down to 129 today.  Hemoglobin 9.5 on admission and has remained stable.  Ferritin 284 similar to 2 months ago, iron sat 9%, iron 20, TIBC 218.  B12 235.  Folate 9.5.  TSH 1.792.  Initial blood culture negative.  Repeat blood culture yesterday remains no growth.  White blood cell count initially 26,700, improved down to 15,102 and now back to 18,700.  Today magnesium 1.8, potassium 3.9, creatinine 0.84.  Day of admission CT chest/abdomen/pelvis showed stable aortic valve replacement.  Colon distended with marked increased stool burden.  Right colon measuring 9.6 cm in diameter.  No colonic wall thickening or inflammation.  Stool moderately distends the rectum.  Follow-up abdominal x-ray September 17 with no pneumoperitoneum.  Nonspecific nonobstructive bowel gas pattern.  Repeat CT abdomen pelvis without contrast yesterday showed moderate to large colonic stool burden suggesting constipation, superimposed colonic wall thickening to  suggest or coral colitis.    Bisacodyl suppository ordered for September 15 but not given.  MiraLAX 17 g daily given on the 15th, 16th.  Received 2 doses on the 17th and 18th.  Senokot S1 tablet provide of the 17th and 18th.   Colonoscopy 2011, normal study, recommended surveillance colonoscopy 7 years due to previous adenomatous colon polyps in 2001. Patient states that he had a colonoscopy since then but he does not remember when, where or what was found.  EGD 2007: Billroth I anatomy, distal esophageal stricture status post balloon dilation.  Consult: Patient states at baseline he has chronic constipation.  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  States he has had at least 8 revisions for bowel obstructions, approximately 10+ years ago.  At baseline he has chronic constipation.  Uses morphine as needed for back pain tries not to use daily.  States he never gets an urge to have a bowel movement, he has to monitor for lower abdominal fullness and stay on top of things with use of daily stool softener, MiraLAX every other day.  States he has had some small bowel movements since admission.  Denies any melena or rectal bleeding.  Typically does not have to use suppositories or enema at home.  Occasional abdominal pain but not often.  States he has been on Linzess in the past which seemed to work, sometimes difficult to get medications covered by the [place].  Over the past year he reports losing about 80 pounds, during that time he had COVID, strokes endocarditis, multiple admissions.  Denies any heartburn or dysphagia.   Prior to Admission medications  colon extending to the level of the distal rectum. No pathologic distension of small bowel is noted. No gross evidence of pneumoperitoneum. Surgical clips project over the epigastric region. IMPRESSION: 1. Nonspecific, nonobstructive bowel gas pattern. 2. No pneumoperitoneum. Electronically Signed   By: Trudie Reed M.D.   On: 02/16/2023 07:24   ECHOCARDIOGRAM COMPLETE  Result Date: 02/14/2023    ECHOCARDIOGRAM REPORT   Patient Name:   [name] Date of Exam: 02/14/2023 Medical Rec #:  [id number]       Height:       73.5 in Accession #:    [id number]      Weight:       180.6 lb Date of Birth:  06/13/1950       BSA:          2.071 m Patient Age:    72 years        BP:           115/66 mmHg Patient Gender: M               HR:           65 bpm. [facility] Location[facility] Procedure: 2D Echo, Cardiac Doppler and Color Doppler Indications:    CHF  History:        Patient has prior history of Echocardiogram examinations, most                 recent 12/12/2022. COPD; Aortic Valve Disease.  Sonographer:    Darlys Gales Referring Phys: [id number] OLADAPO ADEFESO IMPRESSIONS  1. Left ventricular ejection fraction, by estimation, is 55 to 60%. The left ventricle has normal function. The left ventricle has no regional wall motion abnormalities.  2. Right ventricular systolic function is normal. The right ventricular size is normal.  3. Left atrial size was mildly dilated.  4. The mitral valve is degenerative. Mild to moderate mitral valve regurgitation. Mild mitral stenosis. The mean mitral valve gradient is 4.7 mmHg. Moderate mitral annular calcification.  5. The aortic valve is calcified. Aortic valve regurgitation is not visualized. Mild to moderate aortic valve stenosis. Aortic valve area, by VTI measures 1.77 cm. Aortic valve mean gradient measures  14.5 mmHg. Aortic valve Vmax measures 2.63 m/s.  6. The inferior vena cava is normal in size with greater than 50% respiratory variability, suggesting right atrial pressure of 3 mmHg. FINDINGS  Left Ventricle: Left ventricular ejection fraction, by estimation, is 55 to 60%. The left ventricle has normal function. The left ventricle has no regional wall motion abnormalities. The left ventricular internal cavity size was normal in size. There is  no left ventricular hypertrophy. Left ventricular diastolic function could not be evaluated due to mitral annular calcification (moderate or greater). Right Ventricle: The right ventricular size is normal. No increase in right ventricular wall thickness. Right ventricular systolic function is normal. Left Atrium: Left atrial size was mildly dilated. Right Atrium: Right atrial size was normal in size. Pericardium: There is no evidence of pericardial effusion. Mitral Valve: The mitral valve is degenerative in appearance. Moderate mitral annular calcification. Mild to moderate mitral valve regurgitation. Mild mitral valve stenosis. MV peak gradient, 12.0 mmHg. The mean mitral valve gradient is 4.7 mmHg. Tricuspid Valve: The tricuspid valve is normal in structure. Tricuspid valve regurgitation is not demonstrated. No evidence of tricuspid stenosis. Aortic Valve: The aortic valve is calcified. Aortic valve regurgitation is not visualized. Mild to moderate aortic stenosis is present. Aortic valve mean gradient measures 14.5  colon extending to the level of the distal rectum. No pathologic distension of small bowel is noted. No gross evidence of pneumoperitoneum. Surgical clips project over the epigastric region. IMPRESSION: 1. Nonspecific, nonobstructive bowel gas pattern. 2. No pneumoperitoneum. Electronically Signed   By: Trudie Reed M.D.   On: 02/16/2023 07:24   ECHOCARDIOGRAM COMPLETE  Result Date: 02/14/2023    ECHOCARDIOGRAM REPORT   Patient Name:   [name] Date of Exam: 02/14/2023 Medical Rec #:  [id number]       Height:       73.5 in Accession #:    [id number]      Weight:       180.6 lb Date of Birth:  06/13/1950       BSA:          2.071 m Patient Age:    72 years        BP:           115/66 mmHg Patient Gender: M               HR:           65 bpm. [facility] Procedure: 2D Echo, Cardiac Doppler and Color Doppler Indications:    CHF  History:        Patient has prior history of Echocardiogram examinations, most                 recent 12/12/2022. COPD; Aortic Valve Disease.  Sonographer:    Darlys Gales Referring Phys: [id number] OLADAPO ADEFESO IMPRESSIONS  1. Left ventricular ejection fraction, by estimation, is 55 to 60%. The left ventricle has normal function. The left ventricle has no regional wall motion abnormalities.  2. Right ventricular systolic function is normal. The right ventricular size is normal.  3. Left atrial size was mildly dilated.  4. The mitral valve is degenerative. Mild to moderate mitral valve regurgitation. Mild mitral stenosis. The mean mitral valve gradient is 4.7 mmHg. Moderate mitral annular calcification.  5. The aortic valve is calcified. Aortic valve regurgitation is not visualized. Mild to moderate aortic valve stenosis. Aortic valve area, by VTI measures 1.77 cm. Aortic valve mean gradient measures  14.5 mmHg. Aortic valve Vmax measures 2.63 m/s.  6. The inferior vena cava is normal in size with greater than 50% respiratory variability, suggesting right atrial pressure of 3 mmHg. FINDINGS  Left Ventricle: Left ventricular ejection fraction, by estimation, is 55 to 60%. The left ventricle has normal function. The left ventricle has no regional wall motion abnormalities. The left ventricular internal cavity size was normal in size. There is  no left ventricular hypertrophy. Left ventricular diastolic function could not be evaluated due to mitral annular calcification (moderate or greater). Right Ventricle: The right ventricular size is normal. No increase in right ventricular wall thickness. Right ventricular systolic function is normal. Left Atrium: Left atrial size was mildly dilated. Right Atrium: Right atrial size was normal in size. Pericardium: There is no evidence of pericardial effusion. Mitral Valve: The mitral valve is degenerative in appearance. Moderate mitral annular calcification. Mild to moderate mitral valve regurgitation. Mild mitral valve stenosis. MV peak gradient, 12.0 mmHg. The mean mitral valve gradient is 4.7 mmHg. Tricuspid Valve: The tricuspid valve is normal in structure. Tricuspid valve regurgitation is not demonstrated. No evidence of tricuspid stenosis. Aortic Valve: The aortic valve is calcified. Aortic valve regurgitation is not visualized. Mild to moderate aortic stenosis is present. Aortic valve mean gradient measures 14.5  Gastroenterology Consult   Referring Provider: No ref. provider found Primary Care Physician:  [name], MD Primary Gastroenterologist:  remotely saw Dr. Arlyce Dice for years  Patient ID: [name]; [id number]; Oct 23, 1950   Admit date: 02/13/2023  LOS: 5 days   Date of Consultation: 02/18/2023  Reason for Consultation:  constipation/obstipation    History of Present Illness   [name] is a 72 y.o. male with past medical history significant for hypertension, A-fib on anticoagulation, TAVR, COPD, GERD, mild dementia, prior strokes who was admitted with altered mental status and fever.  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  This admission he was noted to have UTI, ESBL E. coli.  Initially his sodium was 128, chronically with hyponatremia.  Corrected up to 132 but back down to 129 today.  Hemoglobin 9.5 on admission and has remained stable.  Ferritin 284 similar to 2 months ago, iron sat 9%, iron 20, TIBC 218.  B12 235.  Folate 9.5.  TSH 1.792.  Initial blood culture negative.  Repeat blood culture yesterday remains no growth.  White blood cell count initially 26,700, improved down to 15,102 and now back to 18,700.  Today magnesium 1.8, potassium 3.9, creatinine 0.84.  Day of admission CT chest/abdomen/pelvis showed stable aortic valve replacement.  Colon distended with marked increased stool burden.  Right colon measuring 9.6 cm in diameter.  No colonic wall thickening or inflammation.  Stool moderately distends the rectum.  Follow-up abdominal x-ray September 17 with no pneumoperitoneum.  Nonspecific nonobstructive bowel gas pattern.  Repeat CT abdomen pelvis without contrast yesterday showed moderate to large colonic stool burden suggesting constipation, superimposed colonic wall thickening to  suggest or coral colitis.    Bisacodyl suppository ordered for September 15 but not given.  MiraLAX 17 g daily given on the 15th, 16th.  Received 2 doses on the 17th and 18th.  Senokot S1 tablet provide of the 17th and 18th.   Colonoscopy 2011, normal study, recommended surveillance colonoscopy 7 years due to previous adenomatous colon polyps in 2001. Patient states that he had a colonoscopy since then but he does not remember when, where or what was found.  EGD 2007: Billroth I anatomy, distal esophageal stricture status post balloon dilation.  Consult: Patient states at baseline he has chronic constipation.  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  States he has had at least 8 revisions for bowel obstructions, approximately 10+ years ago.  At baseline he has chronic constipation.  Uses morphine as needed for back pain tries not to use daily.  States he never gets an urge to have a bowel movement, he has to monitor for lower abdominal fullness and stay on top of things with use of daily stool softener, MiraLAX every other day.  States he has had some small bowel movements since admission.  Denies any melena or rectal bleeding.  Typically does not have to use suppositories or enema at home.  Occasional abdominal pain but not often.  States he has been on Linzess in the past which seemed to work, sometimes difficult to get medications covered by the [place].  Over the past year he reports losing about 80 pounds, during that time he had COVID, strokes endocarditis, multiple admissions.  Denies any heartburn or dysphagia.   Prior to Admission medications  colon extending to the level of the distal rectum. No pathologic distension of small bowel is noted. No gross evidence of pneumoperitoneum. Surgical clips project over the epigastric region. IMPRESSION: 1. Nonspecific, nonobstructive bowel gas pattern. 2. No pneumoperitoneum. Electronically Signed   By: Trudie Reed M.D.   On: 02/16/2023 07:24   ECHOCARDIOGRAM COMPLETE  Result Date: 02/14/2023    ECHOCARDIOGRAM REPORT   Patient Name:   [name] Date of Exam: 02/14/2023 Medical Rec #:  [id number]       Height:       73.5 in Accession #:    [id number]      Weight:       180.6 lb Date of Birth:  06/13/1950       BSA:          2.071 m Patient Age:    72 years        BP:           115/66 mmHg Patient Gender: M               HR:           65 bpm. [facility] Procedure: 2D Echo, Cardiac Doppler and Color Doppler Indications:    CHF  History:        Patient has prior history of Echocardiogram examinations, most                 recent 12/12/2022. COPD; Aortic Valve Disease.  Sonographer:    Darlys Gales Referring Phys: [id number] OLADAPO ADEFESO IMPRESSIONS  1. Left ventricular ejection fraction, by estimation, is 55 to 60%. The left ventricle has normal function. The left ventricle has no regional wall motion abnormalities.  2. Right ventricular systolic function is normal. The right ventricular size is normal.  3. Left atrial size was mildly dilated.  4. The mitral valve is degenerative. Mild to moderate mitral valve regurgitation. Mild mitral stenosis. The mean mitral valve gradient is 4.7 mmHg. Moderate mitral annular calcification.  5. The aortic valve is calcified. Aortic valve regurgitation is not visualized. Mild to moderate aortic valve stenosis. Aortic valve area, by VTI measures 1.77 cm. Aortic valve mean gradient measures  14.5 mmHg. Aortic valve Vmax measures 2.63 m/s.  6. The inferior vena cava is normal in size with greater than 50% respiratory variability, suggesting right atrial pressure of 3 mmHg. FINDINGS  Left Ventricle: Left ventricular ejection fraction, by estimation, is 55 to 60%. The left ventricle has normal function. The left ventricle has no regional wall motion abnormalities. The left ventricular internal cavity size was normal in size. There is  no left ventricular hypertrophy. Left ventricular diastolic function could not be evaluated due to mitral annular calcification (moderate or greater). Right Ventricle: The right ventricular size is normal. No increase in right ventricular wall thickness. Right ventricular systolic function is normal. Left Atrium: Left atrial size was mildly dilated. Right Atrium: Right atrial size was normal in size. Pericardium: There is no evidence of pericardial effusion. Mitral Valve: The mitral valve is degenerative in appearance. Moderate mitral annular calcification. Mild to moderate mitral valve regurgitation. Mild mitral valve stenosis. MV peak gradient, 12.0 mmHg. The mean mitral valve gradient is 4.7 mmHg. Tricuspid Valve: The tricuspid valve is normal in structure. Tricuspid valve regurgitation is not demonstrated. No evidence of tricuspid stenosis. Aortic Valve: The aortic valve is calcified. Aortic valve regurgitation is not visualized. Mild to moderate aortic stenosis is present. Aortic valve mean gradient measures 14.5  Gastroenterology Consult   Referring Provider: No ref. provider found Primary Care Physician:  [name], MD Primary Gastroenterologist:  remotely saw Dr. Arlyce Dice for years  Patient ID: [name]; [id number]; Oct 23, 1950   Admit date: 02/13/2023  LOS: 5 days   Date of Consultation: 02/18/2023  Reason for Consultation:  constipation/obstipation    History of Present Illness   [name] is a 72 y.o. male with past medical history significant for hypertension, A-fib on anticoagulation, TAVR, COPD, GERD, mild dementia, prior strokes who was admitted with altered mental status and fever.  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  This admission he was noted to have UTI, ESBL E. coli.  Initially his sodium was 128, chronically with hyponatremia.  Corrected up to 132 but back down to 129 today.  Hemoglobin 9.5 on admission and has remained stable.  Ferritin 284 similar to 2 months ago, iron sat 9%, iron 20, TIBC 218.  B12 235.  Folate 9.5.  TSH 1.792.  Initial blood culture negative.  Repeat blood culture yesterday remains no growth.  White blood cell count initially 26,700, improved down to 15,102 and now back to 18,700.  Today magnesium 1.8, potassium 3.9, creatinine 0.84.  Day of admission CT chest/abdomen/pelvis showed stable aortic valve replacement.  Colon distended with marked increased stool burden.  Right colon measuring 9.6 cm in diameter.  No colonic wall thickening or inflammation.  Stool moderately distends the rectum.  Follow-up abdominal x-ray September 17 with no pneumoperitoneum.  Nonspecific nonobstructive bowel gas pattern.  Repeat CT abdomen pelvis without contrast yesterday showed moderate to large colonic stool burden suggesting constipation, superimposed colonic wall thickening to  suggest or coral colitis.    Bisacodyl suppository ordered for September 15 but not given.  MiraLAX 17 g daily given on the 15th, 16th.  Received 2 doses on the 17th and 18th.  Senokot S1 tablet provide of the 17th and 18th.   Colonoscopy 2011, normal study, recommended surveillance colonoscopy 7 years due to previous adenomatous colon polyps in 2001. Patient states that he had a colonoscopy since then but he does not remember when, where or what was found.  EGD 2007: Billroth I anatomy, distal esophageal stricture status post balloon dilation.  Consult: Patient states at baseline he has chronic constipation.  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  States he has had at least 8 revisions for bowel obstructions, approximately 10+ years ago.  At baseline he has chronic constipation.  Uses morphine as needed for back pain tries not to use daily.  States he never gets an urge to have a bowel movement, he has to monitor for lower abdominal fullness and stay on top of things with use of daily stool softener, MiraLAX every other day.  States he has had some small bowel movements since admission.  Denies any melena or rectal bleeding.  Typically does not have to use suppositories or enema at home.  Occasional abdominal pain but not often.  States he has been on Linzess in the past which seemed to work, sometimes difficult to get medications covered by the [place].  Over the past year he reports losing about 80 pounds, during that time he had COVID, strokes endocarditis, multiple admissions.  Denies any heartburn or dysphagia.   Prior to Admission medications  colon extending to the level of the distal rectum. No pathologic distension of small bowel is noted. No gross evidence of pneumoperitoneum. Surgical clips project over the epigastric region. IMPRESSION: 1. Nonspecific, nonobstructive bowel gas pattern. 2. No pneumoperitoneum. Electronically Signed   By: Trudie Reed M.D.   On: 02/16/2023 07:24   ECHOCARDIOGRAM COMPLETE  Result Date: 02/14/2023    ECHOCARDIOGRAM REPORT   Patient Name:   [name] Date of Exam: 02/14/2023 Medical Rec #:  [id number]       Height:       73.5 in Accession #:    [id number]      Weight:       180.6 lb Date of Birth:  06/13/1950       BSA:          2.071 m Patient Age:    72 years        BP:           115/66 mmHg Patient Gender: M               HR:           65 bpm. [facility] Procedure: 2D Echo, Cardiac Doppler and Color Doppler Indications:    CHF  History:        Patient has prior history of Echocardiogram examinations, most                 recent 12/12/2022. COPD; Aortic Valve Disease.  Sonographer:    Darlys Gales Referring Phys: [id number] OLADAPO ADEFESO IMPRESSIONS  1. Left ventricular ejection fraction, by estimation, is 55 to 60%. The left ventricle has normal function. The left ventricle has no regional wall motion abnormalities.  2. Right ventricular systolic function is normal. The right ventricular size is normal.  3. Left atrial size was mildly dilated.  4. The mitral valve is degenerative. Mild to moderate mitral valve regurgitation. Mild mitral stenosis. The mean mitral valve gradient is 4.7 mmHg. Moderate mitral annular calcification.  5. The aortic valve is calcified. Aortic valve regurgitation is not visualized. Mild to moderate aortic valve stenosis. Aortic valve area, by VTI measures 1.77 cm. Aortic valve mean gradient measures  14.5 mmHg. Aortic valve Vmax measures 2.63 m/s.  6. The inferior vena cava is normal in size with greater than 50% respiratory variability, suggesting right atrial pressure of 3 mmHg. FINDINGS  Left Ventricle: Left ventricular ejection fraction, by estimation, is 55 to 60%. The left ventricle has normal function. The left ventricle has no regional wall motion abnormalities. The left ventricular internal cavity size was normal in size. There is  no left ventricular hypertrophy. Left ventricular diastolic function could not be evaluated due to mitral annular calcification (moderate or greater). Right Ventricle: The right ventricular size is normal. No increase in right ventricular wall thickness. Right ventricular systolic function is normal. Left Atrium: Left atrial size was mildly dilated. Right Atrium: Right atrial size was normal in size. Pericardium: There is no evidence of pericardial effusion. Mitral Valve: The mitral valve is degenerative in appearance. Moderate mitral annular calcification. Mild to moderate mitral valve regurgitation. Mild mitral valve stenosis. MV peak gradient, 12.0 mmHg. The mean mitral valve gradient is 4.7 mmHg. Tricuspid Valve: The tricuspid valve is normal in structure. Tricuspid valve regurgitation is not demonstrated. No evidence of tricuspid stenosis. Aortic Valve: The aortic valve is calcified. Aortic valve regurgitation is not visualized. Mild to moderate aortic stenosis is present. Aortic valve mean gradient measures 14.5  Gastroenterology Consult   Referring Provider: No ref. provider found Primary Care Physician:  [name], MD Primary Gastroenterologist:  remotely saw Dr. Arlyce Dice for years  Patient ID: [name]; [id number]; Oct 23, 1950   Admit date: 02/13/2023  LOS: 5 days   Date of Consultation: 02/18/2023  Reason for Consultation:  constipation/obstipation    History of Present Illness   [name] is a 72 y.o. male with past medical history significant for hypertension, A-fib on anticoagulation, TAVR, COPD, GERD, mild dementia, prior strokes who was admitted with altered mental status and fever.  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  This admission he was noted to have UTI, ESBL E. coli.  Initially his sodium was 128, chronically with hyponatremia.  Corrected up to 132 but back down to 129 today.  Hemoglobin 9.5 on admission and has remained stable.  Ferritin 284 similar to 2 months ago, iron sat 9%, iron 20, TIBC 218.  B12 235.  Folate 9.5.  TSH 1.792.  Initial blood culture negative.  Repeat blood culture yesterday remains no growth.  White blood cell count initially 26,700, improved down to 15,102 and now back to 18,700.  Today magnesium 1.8, potassium 3.9, creatinine 0.84.  Day of admission CT chest/abdomen/pelvis showed stable aortic valve replacement.  Colon distended with marked increased stool burden.  Right colon measuring 9.6 cm in diameter.  No colonic wall thickening or inflammation.  Stool moderately distends the rectum.  Follow-up abdominal x-ray September 17 with no pneumoperitoneum.  Nonspecific nonobstructive bowel gas pattern.  Repeat CT abdomen pelvis without contrast yesterday showed moderate to large colonic stool burden suggesting constipation, superimposed colonic wall thickening to  suggest or coral colitis.    Bisacodyl suppository ordered for September 15 but not given.  MiraLAX 17 g daily given on the 15th, 16th.  Received 2 doses on the 17th and 18th.  Senokot S1 tablet provide of the 17th and 18th.   Colonoscopy 2011, normal study, recommended surveillance colonoscopy 7 years due to previous adenomatous colon polyps in 2001. Patient states that he had a colonoscopy since then but he does not remember when, where or what was found.  EGD 2007: Billroth I anatomy, distal esophageal stricture status post balloon dilation.  Consult: Patient states at baseline he has chronic constipation.  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  States he has had at least 8 revisions for bowel obstructions, approximately 10+ years ago.  At baseline he has chronic constipation.  Uses morphine as needed for back pain tries not to use daily.  States he never gets an urge to have a bowel movement, he has to monitor for lower abdominal fullness and stay on top of things with use of daily stool softener, MiraLAX every other day.  States he has had some small bowel movements since admission.  Denies any melena or rectal bleeding.  Typically does not have to use suppositories or enema at home.  Occasional abdominal pain but not often.  States he has been on Linzess in the past which seemed to work, sometimes difficult to get medications covered by the [place].  Over the past year he reports losing about 80 pounds, during that time he had COVID, strokes endocarditis, multiple admissions.  Denies any heartburn or dysphagia.   Prior to Admission medications  colon extending to the level of the distal rectum. No pathologic distension of small bowel is noted. No gross evidence of pneumoperitoneum. Surgical clips project over the epigastric region. IMPRESSION: 1. Nonspecific, nonobstructive bowel gas pattern. 2. No pneumoperitoneum. Electronically Signed   By: Trudie Reed M.D.   On: 02/16/2023 07:24   ECHOCARDIOGRAM COMPLETE  Result Date: 02/14/2023    ECHOCARDIOGRAM REPORT   Patient Name:   [name] Date of Exam: 02/14/2023 Medical Rec #:  [id number]       Height:       73.5 in Accession #:    [id number]      Weight:       180.6 lb Date of Birth:  06/13/1950       BSA:          2.071 m Patient Age:    72 years        BP:           115/66 mmHg Patient Gender: M               HR:           65 bpm. [facility] Procedure: 2D Echo, Cardiac Doppler and Color Doppler Indications:    CHF  History:        Patient has prior history of Echocardiogram examinations, most                 recent 12/12/2022. COPD; Aortic Valve Disease.  Sonographer:    Darlys Gales Referring Phys: [id number] OLADAPO ADEFESO IMPRESSIONS  1. Left ventricular ejection fraction, by estimation, is 55 to 60%. The left ventricle has normal function. The left ventricle has no regional wall motion abnormalities.  2. Right ventricular systolic function is normal. The right ventricular size is normal.  3. Left atrial size was mildly dilated.  4. The mitral valve is degenerative. Mild to moderate mitral valve regurgitation. Mild mitral stenosis. The mean mitral valve gradient is 4.7 mmHg. Moderate mitral annular calcification.  5. The aortic valve is calcified. Aortic valve regurgitation is not visualized. Mild to moderate aortic valve stenosis. Aortic valve area, by VTI measures 1.77 cm. Aortic valve mean gradient measures  14.5 mmHg. Aortic valve Vmax measures 2.63 m/s.  6. The inferior vena cava is normal in size with greater than 50% respiratory variability, suggesting right atrial pressure of 3 mmHg. FINDINGS  Left Ventricle: Left ventricular ejection fraction, by estimation, is 55 to 60%. The left ventricle has normal function. The left ventricle has no regional wall motion abnormalities. The left ventricular internal cavity size was normal in size. There is  no left ventricular hypertrophy. Left ventricular diastolic function could not be evaluated due to mitral annular calcification (moderate or greater). Right Ventricle: The right ventricular size is normal. No increase in right ventricular wall thickness. Right ventricular systolic function is normal. Left Atrium: Left atrial size was mildly dilated. Right Atrium: Right atrial size was normal in size. Pericardium: There is no evidence of pericardial effusion. Mitral Valve: The mitral valve is degenerative in appearance. Moderate mitral annular calcification. Mild to moderate mitral valve regurgitation. Mild mitral valve stenosis. MV peak gradient, 12.0 mmHg. The mean mitral valve gradient is 4.7 mmHg. Tricuspid Valve: The tricuspid valve is normal in structure. Tricuspid valve regurgitation is not demonstrated. No evidence of tricuspid stenosis. Aortic Valve: The aortic valve is calcified. Aortic valve regurgitation is not visualized. Mild to moderate aortic stenosis is present. Aortic valve mean gradient measures 14.5

## 2023-02-18 NOTE — Progress Notes (Signed)
   02/18/23 1422  Vitals  BP (!) 86/54  MAP (mmHg) 66  Pulse Rate 60  MEWS COLOR  MEWS Score Color Green  Pain Assessment  Pain Scale 0-10  Pain Score 0  MEWS Score  MEWS Temp 0  MEWS Systolic 1  MEWS Pulse 0  MEWS RR 0  MEWS LOC 0  MEWS Score 1  Provider Notification  Provider Name/Title Catarina Hartshorn, MD  Date Provider Notified 02/18/23  Time Provider Notified [phone]  Provider response See new orders  Date of Provider Response 02/18/23  Time of Provider Response 1440

## 2023-02-18 NOTE — Plan of Care (Signed)

## 2023-02-18 NOTE — Plan of Care (Signed)
  Problem: Elimination: Goal: Will not experience complications related to bowel motility Outcome: Progressing Goal: Will not experience complications related to urinary retention Outcome: Completed/Met   Problem: Elimination: Goal: Will not experience complications related to urinary retention Outcome: Completed/Met

## 2023-02-19 ENCOUNTER — Telehealth: Payer: Self-pay | Admitting: Gastroenterology

## 2023-02-19 DIAGNOSIS — G9341 Metabolic encephalopathy: Secondary | ICD-10-CM | POA: Diagnosis not present

## 2023-02-19 DIAGNOSIS — N3000 Acute cystitis without hematuria: Secondary | ICD-10-CM | POA: Diagnosis not present

## 2023-02-19 DIAGNOSIS — E871 Hypo-osmolality and hyponatremia: Secondary | ICD-10-CM | POA: Diagnosis not present

## 2023-02-19 LAB — CBC
HCT: 29.3 % — ABNORMAL LOW (ref 39.0–52.0)
Hemoglobin: 9.2 g/dL — ABNORMAL LOW (ref 13.0–17.0)
MCH: 24.7 pg — ABNORMAL LOW (ref 26.0–34.0)
MCHC: 31.4 g/dL (ref 30.0–36.0)
MCV: 78.6 fL — ABNORMAL LOW (ref 80.0–100.0)
Platelets: 444 10*3/uL — ABNORMAL HIGH (ref 150–400)
RBC: 3.73 MIL/uL — ABNORMAL LOW (ref 4.22–5.81)
RDW: 17.2 % — ABNORMAL HIGH (ref 11.5–15.5)
WBC: 17.1 10*3/uL — ABNORMAL HIGH (ref 4.0–10.5)
nRBC: 0 % (ref 0.0–0.2)

## 2023-02-19 LAB — BASIC METABOLIC PANEL
Anion gap: 9 (ref 5–15)
BUN: 19 mg/dL (ref 8–23)
CO2: 23 mmol/L (ref 22–32)
Calcium: 8.7 mg/dL — ABNORMAL LOW (ref 8.9–10.3)
Chloride: 98 mmol/L (ref 98–111)
Creatinine, Ser: 1.09 mg/dL (ref 0.61–1.24)
GFR, Estimated: >60 mL/min (ref >60)
Glucose, Bld: 98 mg/dL (ref 70–99)
Potassium: 4.8 mmol/L (ref 3.5–5.1)
Sodium: 130 mmol/L — ABNORMAL LOW (ref 135–145)

## 2023-02-19 LAB — MAGNESIUM: Magnesium: 2.2 mg/dL (ref 1.7–2.4)

## 2023-02-19 MED ORDER — FOLIC ACID 1 MG PO TABS: 1.0000 mg | ORAL_TABLET | Freq: Every day | ORAL | Status: AC

## 2023-02-19 MED ORDER — ONDANSETRON HCL 4 MG/2ML IJ SOLN
4.0000 mg | Freq: Four times a day (QID) | INTRAMUSCULAR | Status: DC
  Administered 2023-02-19 (×2): 4 mg via INTRAVENOUS
  Filled 2023-02-19 (×2): qty 2

## 2023-02-19 MED ORDER — LINACLOTIDE 290 MCG PO CAPS: 290.0000 ug | ORAL_CAPSULE | Freq: Every day | ORAL | 1 refills | Status: DC

## 2023-02-19 MED ORDER — PANTOPRAZOLE SODIUM 40 MG IV SOLR
40.0000 mg | Freq: Two times a day (BID) | INTRAVENOUS | Status: DC
  Administered 2023-02-19: 40 mg via INTRAVENOUS
  Filled 2023-02-19: qty 10

## 2023-02-19 MED ORDER — BISACODYL 10 MG RE SUPP: 10.0000 mg | Freq: Once | RECTAL | Status: DC

## 2023-02-19 MED ORDER — METOCLOPRAMIDE HCL 5 MG/ML IJ SOLN
10.0000 mg | Freq: Once | INTRAMUSCULAR | Status: AC
  Administered 2023-02-19: 10 mg via INTRAVENOUS
  Filled 2023-02-19: qty 2

## 2023-02-19 MED ORDER — FOSFOMYCIN TROMETHAMINE 3 G PO PACK: 3.0000 g | PACK | Freq: Once | ORAL | 0 refills | Status: AC

## 2023-02-19 MED ORDER — POLYETHYLENE GLYCOL 3350 17 G PO PACK
17.0000 g | PACK | Freq: Three times a day (TID) | ORAL | Status: DC
  Administered 2023-02-19: 17 g via ORAL
  Filled 2023-02-19: qty 1

## 2023-02-19 MED ORDER — METOCLOPRAMIDE HCL 5 MG/ML IJ SOLN: 5.0000 mg | Freq: Once | INTRAMUSCULAR | Status: DC

## 2023-02-19 MED ORDER — CYANOCOBALAMIN 500 MCG PO TABS: 500.0000 ug | ORAL_TABLET | Freq: Every day | ORAL | Status: DC

## 2023-02-19 NOTE — Telephone Encounter (Signed)
Please arrange hospital follow-up for the patient in 3-4 weeks with [facility], Dr. Tasia Catchings or if no availability then myself.   William Bonito, MSN, APRN, FNP-BC, AGACNP-BC [facility] at [facility]

## 2023-02-19 NOTE — TOC Transition Note (Signed)
Transition of Care [facility]) - CM/SW Discharge Note   Patient Details  Name: [name] MRN: [id number] Date of Birth: 02/23/1951  [facility] CM/SW Contact:  Villa Herb, LCSWA Phone Number: 02/19/2023, 10:06 AM   Clinical Narrative:    CSW updated Sarah with [facility] of pts possible D/C today vs over weekend. [facility] will follow and start seeing pt in home for [facility] services. HH PT/OT/RN orders in place. TOC signing off.  Final next level of care: Home w Home Health Services Barriers to Discharge: Barriers Resolved   Patient Goals and CMS Choice CMS Medicare.gov Compare Post Acute Care list provided to:: Patient Represenative (must comment) Choice offered to / list presented to : Patient, Spouse  Discharge Placement                         Discharge Plan and Services Additional resources added to the After Visit Summary for   In-house Referral: Clinical Social Work Discharge Planning Services: CM Consult                      HH Arranged: PT, OT, RN HH Agency: [facility] Date [facility] Agency Contacted: 02/19/23   Representative spoke with at [facility] Agency: Maralyn Sago  Social Determinants of Health (SDOH) Interventions SDOH Screenings   Food Insecurity: No Food Insecurity (02/13/2023)  Housing: Low Risk  (02/13/2023)  Transportation Needs: No Transportation Needs (02/13/2023)  Utilities: Not At Risk (02/13/2023)  Tobacco Use: Medium Risk (12/29/2022)     Readmission Risk Interventions    02/14/2023   11:03 AM  Readmission Risk Prevention Plan  Transportation Screening Complete  PCP or Specialist appointment within 3-5 days of discharge Complete  HRI or Home Care Consult Complete  SW Recovery Care/Counseling Consult Complete  Palliative Care Screening Not Applicable  Skilled Nursing Facility Not Applicable

## 2023-02-19 NOTE — Plan of Care (Signed)

## 2023-02-19 NOTE — Progress Notes (Signed)
Patient given Nulytely to drink at 2030. I check on patient at 0010. Patient only drank approximately 240 ml and stated," Im too nauseated to drink that". Given prn x2 for nausea. Refused to drink anymore. Per patient, " I had a bowel movement earlier this afternoon". Plan of care ongoing.

## 2023-02-19 NOTE — Progress Notes (Cosign Needed Addendum)
Gastroenterology Progress Note   Referring Provider: No ref. provider found Primary Care Physician:  William Davenport, MD Primary Gastroenterologist:  Dr. Tasia [name] (newly assigned, remotely seen [name] with [facility])  Patient ID: [name]; [id number]; [birth year]    Subjective   Denies any abdominal pain however he has been experiencing some nausea without vomiting.  He had a large bowel movement yesterday afternoon and a smaller/medium sized bowel movement this morning.  He only had about 3 bites of his food this morning due to nausea.  He states Zofran is helping with this.  He states he is taken Linzess in the past and did not have much relief from this.  He states he also has tried Reglan in the past given to him by the [place] for nausea.   Objective   Vital signs in last 24 hours Temp:  [98.4 F (36.9 C)-100.5 F (38.1 C)] 100.5 F (38.1 C) (09/20 0800) Pulse Rate:  [56-74] 74 (09/20 0800) Resp:  [18-20] 20 (09/20 0346) BP: (80-144)/(52-79) 120/62 (09/20 0800) SpO2:  [94 %-98 %] 95 % (09/20 0905) Weight:  [82.1 kg] 82.1 kg (09/20 0500) Last BM Date : 02/18/23 (per patient)  Physical Exam General:   Alert and oriented, pleasant Head:  Normocephalic and atraumatic. Eyes:  No icterus, sclera clear. Conjuctiva pink.  Mouth:  Without lesions, mucosa pink and moist.  Neck:  Supple, without thyromegaly or masses.  Abdomen:  Bowel sounds present, soft, non-tender, non-distended. No HSM or hernias noted. No rebound or guarding. No masses appreciated . Neurologic:  Alert and  oriented x4;  grossly normal neurologically. Skin:  Warm and dry, intact without significant lesions.  Psych:  Alert and cooperative. Normal mood and affect.  Intake/Output from previous day: 09/19 0701 - 09/20 0700 In: 960 [P.O.:960] Out: 1200 [Urine:1200] Intake/Output this shift: Total I/O In: 0  Out: 400 [Urine:400]  Lab Results  Recent Labs    02/17/23 0408 02/18/23 0416 02/19/23 0428  WBC  23.7* 18.7* 17.1*  HGB 9.1* 9.2* 9.2*  HCT 29.5* 29.2* 29.3*  PLT 437* 415* 444*   BMET Recent Labs    02/17/23 0408 02/18/23 0416 02/19/23 0428  NA 126* 129* 130*  K 3.5 3.9 4.8  CL 96* 98 98  CO2 21* 22 23  GLUCOSE 104* 93 98  BUN 12 13 19   CREATININE 0.85 0.84 1.09  CALCIUM 8.5* 8.6* 8.7*   LFT No results for input(s): "PROT", "ALBUMIN", "AST", "ALT", "ALKPHOS", "BILITOT", "BILIDIR", "IBILI" in the last 72 hours. PT/INR No results for input(s): "LABPROT", "INR" in the last 72 hours. Hepatitis Panel No results for input(s): "HEPBSAG", "HCVAB", "HEPAIGM", "HEPBIGM" in the last 72 hours.   Studies/Results CT ABDOMEN PELVIS WO CONTRAST  Result Date: 02/17/2023 CLINICAL DATA:  Abdominal pain, distension, leukocytosis EXAM: CT ABDOMEN AND PELVIS WITHOUT CONTRAST TECHNIQUE: Multidetector CT imaging of the abdomen and pelvis was performed following the standard protocol without IV contrast. RADIATION DOSE REDUCTION: This exam was performed according to the departmental dose-optimization program which includes automated exposure control, adjustment of the mA and/or kV according to patient size and/or use of iterative reconstruction technique. COMPARISON:  02/13/2023 FINDINGS: Lower chest: Trace left pleural effusion with mild left basilar atelectasis. Hepatobiliary: Unenhanced liver is unable for calcified granulomata. Status post cholecystectomy. No intrahepatic or extrahepatic ductal dilatation. Pancreas: Within normal limits. Spleen: Within normal limits mid Adrenals/Urinary Tract: Adrenal glands are within normal limits Kidneys are within normal limits. No renal, ureteral, or bladder calculi. No  hydronephrosis. Mildly thick-walled bladder. Stomach/Bowel: Stomach is within normal limits with surgical clips at the GE junction. No evidence of bowel obstruction. Appendix is not discretely visualized. Moderate to large colonic stool burden, suggesting constipation. No superimposed colonic  wall thickening to suggest stercoral colitis. Vascular/Lymphatic: No evidence of abdominal aortic aneurysm. Atherosclerotic calcifications of the abdominal aorta and branch vessels. No suspicious abdominopelvic lymphadenopathy. Reproductive: Prostate is unremarkable. Other: No abdominopelvic ascites. Musculoskeletal: Mild degenerative changes of the visualized thoracolumbar spine. Schmorl's node deformities at T12-L1, unchanged. IMPRESSION: Trace left pleural effusion with mild left basilar atelectasis. Otherwise, no interval change from recent CT. Suspected constipation, without superimposed stercoral colitis. Mildly thick-walled bladder, correlate for cystitis. Electronically Signed   By: Charline Bills M.D.   On: 02/17/2023 17:24   DG Abd 1 View  Result Date: 02/16/2023 CLINICAL DATA:  72 year old male history of emesis. EXAM: ABDOMEN - 1 VIEW COMPARISON:  Abdominal radiograph 02/08/2017. FINDINGS: Gas and stool are seen scattered throughout the colon extending to the level of the distal rectum. No pathologic distension of small bowel is noted. No gross evidence of pneumoperitoneum. Surgical clips project over the epigastric region. IMPRESSION: 1. Nonspecific, nonobstructive bowel gas pattern. 2. No pneumoperitoneum. Electronically Signed   By: Trudie Reed M.D.   On: 02/16/2023 07:24   ECHOCARDIOGRAM COMPLETE  Result Date: 02/14/2023    ECHOCARDIOGRAM REPORT   Patient Name:   [name] Date of Exam: 02/14/2023 Medical Rec #:  [id number]       Height:       73.5 in Accession #:    [id number]      Weight:       180.6 lb Date of Birth:  1951-01-29       BSA:          2.071 m Patient Age:    72 years        BP:           115/66 mmHg Patient Gender: M               HR:           65 bpm. [facility] Procedure: 2D Echo, Cardiac Doppler and Color Doppler Indications:    CHF  History:        Patient has prior history of Echocardiogram examinations, most                 recent 12/12/2022.  COPD; Aortic Valve Disease.  Sonographer:    Darlys Gales Referring Phys: [id number] OLADAPO ADEFESO IMPRESSIONS  1. Left ventricular ejection fraction, by estimation, is 55 to 60%. The left ventricle has normal function. The left ventricle has no regional wall motion abnormalities.  2. Right ventricular systolic function is normal. The right ventricular size is normal.  3. Left atrial size was mildly dilated.  4. The mitral valve is degenerative. Mild to moderate mitral valve regurgitation. Mild mitral stenosis. The mean mitral valve gradient is 4.7 mmHg. Moderate mitral annular calcification.  5. The aortic valve is calcified. Aortic valve regurgitation is not visualized. Mild to moderate aortic valve stenosis. Aortic valve area, by VTI measures 1.77 cm. Aortic valve mean gradient measures 14.5 mmHg. Aortic valve Vmax measures 2.63 m/s.  6. The inferior vena cava is normal in size with greater than 50% respiratory variability, suggesting right atrial pressure of 3 mmHg. FINDINGS  Left Ventricle: Left ventricular ejection fraction, by estimation, is 55 to 60%. The left ventricle has normal function. The left  ventricle has no regional wall motion abnormalities. The left ventricular internal cavity size was normal in size. There is  no left ventricular hypertrophy. Left ventricular diastolic function could not be evaluated due to mitral annular calcification (moderate or greater). Right Ventricle: The right ventricular size is normal. No increase in right ventricular wall thickness. Right ventricular systolic function is normal. Left Atrium: Left atrial size was mildly dilated. Right Atrium: Right atrial size was normal in size. Pericardium: There is no evidence of pericardial effusion. Mitral Valve: The mitral valve is degenerative in appearance. Moderate mitral annular calcification. Mild to moderate mitral valve regurgitation. Mild mitral valve stenosis. MV peak gradient, 12.0 mmHg. The mean mitral valve gradient is  4.7 mmHg. Tricuspid Valve: The tricuspid valve is normal in structure. Tricuspid valve regurgitation is not demonstrated. No evidence of tricuspid stenosis. Aortic Valve: The aortic valve is calcified. Aortic valve regurgitation is not visualized. Mild to moderate aortic stenosis is present. Aortic valve mean gradient measures 14.5 mmHg. Aortic valve peak gradient measures 27.7 mmHg. Aortic valve area, by VTI measures 1.77 cm. There is a 29 mm Edwards bioprosthetic valve present in the aortic position. Pulmonic Valve: The pulmonic valve was not well visualized. Pulmonic valve regurgitation is not visualized. No evidence of pulmonic stenosis. Aorta: The aortic root is normal in size and structure. Venous: The inferior vena cava is normal in size with greater than 50% respiratory variability, suggesting right atrial pressure of 3 mmHg. IAS/Shunts: No atrial level shunt detected by color flow Doppler.  LEFT VENTRICLE PLAX 2D LVIDd:         5.20 cm   Diastology LVIDs:         3.30 cm   LV e' medial:    6.09 cm/s LV PW:         0.80 cm   LV E/e' medial:  17.4 LV IVS:        1.00 cm   LV e' lateral:   7.18 cm/s LVOT diam:     1.90 cm   LV E/e' lateral: 14.8 LV SV:         101 LV SV Index:   49 LVOT Area:     2.84 cm  RIGHT VENTRICLE RV S prime:     14.30 cm/s TAPSE (M-mode): 3.6 cm LEFT ATRIUM             Index        RIGHT ATRIUM           Index LA Vol (A2C):   57.9 ml 27.96 ml/m  RA Area:     18.50 cm LA Vol (A4C):   74.3 ml 35.88 ml/m  RA Volume:   48.60 ml  23.47 ml/m LA Biplane Vol: 68.5 ml 33.08 ml/m  AORTIC VALVE AV Area (Vmax):    1.78 cm AV Area (Vmean):   1.77 cm AV Area (VTI):     1.77 cm AV Vmax:           263.00 cm/s AV Vmean:          178.000 cm/s AV VTI:            0.572 m AV Peak Grad:      27.7 mmHg AV Mean Grad:      14.5 mmHg LVOT Vmax:         165.00 cm/s LVOT Vmean:        111.000 cm/s LVOT VTI:          0.357 m LVOT/AV VTI  ratio: 0.62 MITRAL VALVE MV Area (PHT): 1.48 cm     SHUNTS MV Area  VTI:   1.54 cm     Systemic VTI:  0.36 m MV Peak grad:  12.0 mmHg    Systemic Diam: 1.90 cm MV Mean grad:  4.7 mmHg MV Vmax:       1.73 m/s MV Vmean:      102.3 cm/s MV Decel Time: 512 msec MV E velocity: 106.00 cm/s MV A velocity: 149.00 cm/s MV E/A ratio:  0.71 Kardie Tobb DO Electronically signed by Thomasene Ripple DO Signature Date/Time: 02/14/2023/12:05:37 PM    Final    CT CHEST ABDOMEN PELVIS WO CONTRAST  Result Date: 02/13/2023 CLINICAL DATA:  Sepsis"brought in by [facility] from home with c/o AMS. Wife reports he was "slower than normal". EMS reports he was alert but confused when they arrived. Normally A&O x 4. Vitals for EMS-Temp 101, HR 100, NSR, BP 101/67, ETCO2 20. Pt is alert to self, place, and situation, disoriented only to time upon arrival to ED. Pt denies pain. Wife reported to EMS that he has an "aortic infection" and is currently on 2 strong antibiotics, but EMS is unaware of which ones they are. " EXAM: CT CHEST, ABDOMEN AND PELVIS WITHOUT CONTRAST TECHNIQUE: Multidetector CT imaging of the chest, abdomen and pelvis was performed following the standard protocol without IV contrast. RADIATION DOSE REDUCTION: This exam was performed according to the departmental dose-optimization program which includes automated exposure control, adjustment of the mA and/or kV according to patient size and/or use of iterative reconstruction technique. COMPARISON:  12/09/2022. FINDINGS: CT CHEST FINDINGS Cardiovascular: Heart normal in size. No pericardial effusion. Left coronary artery calcifications. Stable aortic valve replacement. Great vessels normal in caliber. Aortic atherosclerotic calcifications. Mediastinum/Nodes: No neck base, mediastinal or hilar masses. No enlarged lymph nodes. Trachea esophagus are unremarkable. Lungs/Pleura: Minimal dependent subsegmental atelectasis. No evidence of pneumonia or pulmonary edema. No lung mass. No suspicious nodule. No pleural effusion or pneumothorax. Musculoskeletal: No  fracture or acute finding. No bone lesion. No chest wall mass. CT ABDOMEN PELVIS FINDINGS Hepatobiliary: Unremarkable liver. Status post cholecystectomy. No bile duct dilation. Pancreas: Pancreatic atrophy. No mass, duct dilation or inflammation. Spleen: Normal in size without focal abnormality. Adrenals/Urinary Tract: No adrenal mass. Kidneys normal in size, orientation and position. 12 mm low-attenuation renal mass, midpole of the right kidney, consistent with a cyst, no follow-up recommended. No other renal mass. No stone. No hydronephrosis. Ureters are normal in course and in caliber. Bladder is unremarkable. Stomach/Bowel: Surgical vascular clips at the gastroesophageal junction, stable. Stomach mostly decompressed, otherwise unremarkable. Small bowel normal in caliber. Scattered small bowel air-fluid levels. No small bowel wall thickening or inflammation. Colon is distended with marked increased stool burden. Right colon measures up to 9.6 cm in diameter. No colonic wall thickening or inflammation. No evidence of obstruction. Stool moderately distends the rectum. Vascular/Lymphatic: Aortic atherosclerosis. No enlarged lymph nodes. Reproductive: Prostate normal in size. Other: No abdominal wall hernia or abnormality. No abdominopelvic ascites. Musculoskeletal: No acute fracture or acute finding. Mild depression of the upper endplate of L1 with associated Schmorl's nodes and sclerosis, stable from the prior exam. Stable degenerative changes. No aggressive bone lesion. IMPRESSION: 1. No acute findings within the chest, abdomen or pelvis. No source of sepsis. 2. Marked increased colonic stool consistent with constipation. No bowel obstruction or inflammation. 3. Multiple stable chronic findings detailed above. Electronically Signed   By: Amie Portland M.D.   On: 02/13/2023 15:31  CT Head Wo Contrast  Result Date: 02/13/2023 CLINICAL DATA:  Mental status change"brought in by [facility] from home with c/o AMS. Wife  reports he was "slower than normal". EMS reports he was alert but confused when they arrived. Normally A&O x 4. Vitals for EMS-Temp 101, HR 100, NSR, BP 101/67, ETCO2 20. Pt is alert to self, place, and situation, disoriented only to time upon arrival to ED. Pt denies pain. Wife reported to EMS that he has an "aortic infection" and is currently on 2 strong antibiotics, but EMS is unaware of which ones they are. " EXAM: CT HEAD WITHOUT CONTRAST TECHNIQUE: Contiguous axial images were obtained from the base of the skull through the vertex without intravenous contrast. RADIATION DOSE REDUCTION: This exam was performed according to the departmental dose-optimization program which includes automated exposure control, adjustment of the mA and/or kV according to patient size and/or use of iterative reconstruction technique. COMPARISON:  12/09/2022. FINDINGS: Brain: No evidence of acute infarction, hemorrhage, hydrocephalus, extra-axial collection or mass lesion/mass effect. Ventricle and sulcal enlargement reflecting moderate atrophy. Focus of hypoattenuation noted in posterior left frontal lobe consistent with an old infarct, stable. Vascular: No hyperdense vessel or unexpected calcification. Skull: Normal. Negative for fracture or focal lesion. Sinuses/Orbits: Globes and orbits are unremarkable. Moderate mucosal thickening lines the right frontal and bilateral ethmoids sinuses with mild mucosal thickening along the anterior sphenoid and bilateral maxillary sinuses. Other: None. IMPRESSION: 1. No acute intracranial abnormalities. 2. Stable intracranial appearance from the prior head CT. Electronically Signed   By: Amie Portland M.D.   On: 02/13/2023 15:21   DG Chest Port 1 View  Result Date: 02/13/2023 CLINICAL DATA:  Sepsis EXAM: PORTABLE CHEST 1 VIEW COMPARISON:  12/11/2022 FINDINGS: Single frontal view of the chest demonstrates a stable cardiac silhouette. Aortic valve prosthesis again noted. Pulmonary vascular  congestion, with patchy bibasilar consolidation left greater than right. No effusion or pneumothorax. No acute bony abnormalities. IMPRESSION: 1. Patchy bibasilar consolidation, left greater than right, which could reflect infection or asymmetric edema. 2. Increased pulmonary vascular congestion. Electronically Signed   By: Sharlet Salina M.D.   On: 02/13/2023 14:14    Assessment  72 y.o. 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 coli.  GI consulted for constipation/obstipation.  Constipation/obstipation: CT x 2 this admission with evidence of significant stool burden with right colonic dilatation does not 0.6 cm without evidence of obstruction and some mild concern for stercoral colitis.  Has chronic constipation at home which patient attributes to vagus nerve injury given extensive prior bowel surgeries, likely also secondary to chronic opioid use and iron intake.  Patient felt as though his home regimen was effective which was MiraLAX every other day with daily stool softener. Patient refused to take the majority of bowel prep yesterday afternoon secondary to nausea and reiterates this to me today  This admission he was placed on Linzess 290 mcg and has been getting MiraLAX BID. He received bisacodyl suppository yesterday.  He had 1 large bowel movement yesterday and a medium sized bowel movement this morning.  Currently abdomen not distended or tender.  Given he has not had a significant amount of bowel movements since initiation of suppository and Linzess yesterday we  will repeat suppository again today and increase MiraLAX to 3 times daily.  Plan / Recommendations  Continue Linzess 290 mcg daily Increase miralax  to TID Bisacodyl suppository 10 mg again today Will give dose of reglan May benefit from addition of Movantik for constipation in the setting of chronic opioid use Recommend outpatient follow-up for adjustment of chronic bowel regimen.    GI will sign off. Please reach out if any new concerns or worsening symptoms.    LOS: 6 days   02/19/2023, 10:15 AM   Brooke Bonito, MSN, FNP-BC, AGACNP-BC [facility]

## 2023-02-19 NOTE — Discharge Summary (Signed)
Physician Discharge Summary   Patient: [name] MRN: [id number] DOB: 12-Jan-1951  Admit date:     02/13/2023  Discharge date: 02/19/23  Discharge Physician: Onalee Hua Natsuko Kelsay   PCP: [name], MD   Recommendations at discharge:   Please follow up with primary care provider within 1-2 weeks  Please repeat BMP and CBC in one week     Hospital Course: 72 y.o. male with medical history significant of hypertension, atrial fibrillation on anticoagulation, TAVR, COPD, GERD, mild dementia, prior CVA x 2 who presents to the emergency department from home via EMS due to altered mental status which started yesterday. 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  Patient complained of subjective fever yesterday, wife activated EMS due to altered mental status was described as " slower than normal".  On arrival of EMS team, patient was said to have fever of 101F and was taken to the ED for further evaluation and management.  Patient was reported to be confused on arrival to the ED. he denies chest pain, shortness of breath, vomiting, diarrhea, abdominal pain.   ED Course:  In the emergency department, he was hemodynamically stable.  Workup in the ED showed WBC 26.7, hemoglobin 9.5, hematocrit 29.7, MCV 77.1, platelets 382.  BMP sodium 128, potassium 3.5, chloride 97, bicarb 21, blood glucose 118, BUN 16, creatinine 0.83, albumin 2.9, ammonia 12, lactic acid was normal, troponin x 2 was normal.  SARS coronavirus 2 was negative.  Blood culture pending.  CT chest abdomen and pelvis showed no acute findings within the chest, abdomen or pelvis.  No source of sepsis.  Marked increased colonic stool consistent with constipation.  No bowel obstruction or inflammation. CT head without contrast showed  no acute intracranial abnormalities.  Chest x-ray showed patchy bibasilar consolidation, left greater than right, which could reflect infection or asymmetric edema.  Increased pulmonary vascular congestion.  Patient was empirically started on ceftriaxone and azithromycin.  IV hydration was provided. Hospitalist was asked to admit patient for further evaluation and management.  Assessment and Plan: Acute metabolic encephalopathy - improving -secondary to urinary tract infection -mental status improving>>back to  baseline on day of d/c -Continue supportive measures -B12--235>>give IM B12 and started po B12   ESBL E Coli UTI  -presented with Fever/leukocytosis/malaise/AMS -there was initial concern for reemergence of infective endocarditis but given his rapid response to treatment for UTI and given he was fully treated for endocarditis we will see how he responds to UTI treatment -IV meropenem ordered for ESBL treatment then changed to Bactrim (3 day treatment course) and gave one dose fosfomycin 9/19 -follow blood cultures: neg to date -WBC trending down 26.7>>17.1; afebrile and hemodynamically stable   Hyponatremia - improving  -suspect from dehydration/poor oral intake -restart IVF>>improving -Na = 130 on day of d/c   Iron deficiency anemia  -continue daily iron supplement    Opioid Induced Constipation -dulcolax suppository ordered 9/15--pt refused -continue BID miralax treatment  -add nightly senna treatment - 9/18 repeat CT abd--Moderate to large colonic stool burden, suggesting constipation. No superimposed colonic wall thickening to suggest stercoral colitis -TSH 1.792 -appreciate GI>>ordered Go-Lytely but pt only drank 240 cc -continue miralax bid, linaclotide 290 mcg -may ultimately need Movantik -9/19--had a substantial BM -9/20--had another small BM>>abd improved, no  further distension   Chronic Pain / Opioid Dependence - lidocaine patch - resumed home morphine IR as  needed -PDMP reviewed--no red flags   GAD - he is on alprazolam as needed    Dementia without behavior disturbance - delirium precautions   Essential hypertension>> hypotensive -carvedilol, losartan>>hold temporarily -restart carvedilol, but hold off on losartan at time d/c   Hypokalemia - supplemental potassium ordered and repleted  - recheck BMP in AM    History of TAVR 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) - pt completed 6 weeks ceftriaxone IV on 01/21/23  - pt missed outpatient follow up with ID - TTE did not show evidence of vegetations -blood cultures have remained neg   Hyperlipidemia -resumed rosuvastatin daily    Persistent Atrial Fibrillation  -resume carvedilol and apixaban    Hypothyroidism  -resume levothyroxine daily         Consultants: GI Procedures performed: none  Disposition: Home Diet recommendation:  Cardiac diet DISCHARGE MEDICATION: Allergies as of 02/19/2023       Reactions   Bee Venom Anaphylaxis   Bystolic [nebivolol Hcl] Other (See Comments)   Nightmares, flashbacks (PTSD)   Codeine Shortness Of Breath, Rash   Doxycycline Shortness Of Breath, Rash   Iodinated Contrast Media Shortness Of Breath, Anaphylaxis   Lost consciousness/difficulty breathing Omni - Paque Contrast    Iohexol Hives, Shortness Of Breath, Rash   Neck and Torso erythemia IVP-DYE >> SHORTNESS OF BREATH   Levaquin [levofloxacin] Hives, Shortness Of Breath   Oxycodone Hcl Shortness Of Breath, Swelling, Rash   Stadol [butorphanol] Shortness Of Breath   Succinylcholine Chloride Shortness Of Breath, Nausea Only, Rash, Other (See Comments)   Difficulty breathing   Tamiflu [oseltamivir Phosphate]    UNSPECIFIED REACTION    Atorvastatin    myalgias   Synvisc [hylan G-f 20] Hives, Rash   Gemfibrozil    Other reaction(s): Serum cholesterol raised   Latex Other (See Comments)   blister   Lisinopril    Other reaction(s): Cough    Omeprazole    Dilaudid [hydromorphone Hcl] Nausea Only, Rash   Aggressive   Methocarbamol Nausea Only, Rash   Niacin Rash   Pentazocine Lactate Rash   rash        Medication List     STOP taking these medications    carvedilol 12.5 MG tablet Commonly known as: COREG   losartan 25 MG tablet Commonly known as: COZAAR       TAKE these medications    albuterol 108 (90 Base) MCG/ACT inhaler Commonly known as: VENTOLIN HFA Inhale 1-2 puffs into the lungs every 4 (four) hours as needed for wheezing or shortness of breath.   albuterol (2.5 MG/3ML) 0.083% nebulizer solution Commonly known as: PROVENTIL Take 2.5 mg by nebulization every 6 (six) hours as needed for wheezing or shortness of breath.   ALPRAZolam 0.5 MG tablet Commonly known as: XANAX Take 1 tablet (0.5 mg total) by mouth 3 (three) times daily as needed for anxiety or sleep.   apixaban 5 MG Tabs tablet Commonly known as: ELIQUIS Take 5 mg by mouth 2 (two) times daily.   cyanocobalamin 500 MCG tablet Commonly known as: VITAMIN B12 Take 1 tablet (500 mcg total) by mouth daily. Start taking on: February 20, 2023   cyclobenzaprine 5 MG tablet Commonly known as: FLEXERIL Take 5 mg by mouth 3 (three) times daily as needed.   EPINEPHrine 0.3 mg/0.3 mL Soaj injection Commonly known as: EPI-PEN Inject 0.3 mg  into the muscle once as needed for anaphylaxis.   ferrous sulfate 325 (65 FE) MG tablet Take 325 mg by mouth daily.   fluticasone-salmeterol 250-50 MCG/ACT Aepb Commonly known as: ADVAIR Inhale 1 puff into the lungs in the morning and at bedtime. Rinse mouth well with water after each use   folic acid 1 MG tablet Commonly known as: FOLVITE Take 1 tablet (1 mg total) by mouth daily. Start taking on: February 20, 2023   fosfomycin 3 g Pack Commonly known as: MONUROL Take 3 g by mouth once for 1 dose. Start taking on: February 20, 2023   levothyroxine 25 MCG tablet Commonly known as:  SYNTHROID Take 1 tablet (25 mcg total) by mouth daily at 6 (six) AM.   lidocaine 5 % Commonly known as: LIDODERM Place 1 patch onto the skin daily. Remove & Discard patch within 12 hours or as directed by MD What changed:  when to take this reasons to take this   linaclotide 290 MCG Caps capsule Commonly known as: LINZESS Take 1 capsule (290 mcg total) by mouth daily before breakfast. Start taking on: February 20, 2023   morphine 15 MG tablet Commonly known as: MSIR Take 1 tablet (15 mg total) by mouth every 4 (four) hours as needed for severe pain. What changed: when to take this   multivitamin with minerals Tabs tablet Take 1 tablet by mouth at bedtime.   nitroGLYCERIN 0.4 MG SL tablet Commonly known as: NITROSTAT Place 1 tablet (0.4 mg total) under the tongue every 5 (five) minutes as needed for chest pain.   polyethylene glycol powder 17 GM/SCOOP powder Commonly known as: GLYCOLAX/MIRALAX Take 1 Container by mouth 2 (two) times daily as needed for mild constipation or moderate constipation.   promethazine 25 MG tablet Commonly known as: PHENERGAN Take 25 mg by mouth 4 (four) times daily as needed for nausea or vomiting.   rosuvastatin 40 MG tablet Commonly known as: CRESTOR Take 40 mg by mouth daily.   sertraline 100 MG tablet Commonly known as: ZOLOFT Take 150 mg by mouth daily.   Vitamin D-3 125 MCG (5000 UT) Tabs Take 5,000 Units by mouth daily.        Discharge Exam: Filed Weights   02/17/23 0500 02/18/23 0500 02/19/23 0500  Weight: 82.2 kg 82.8 kg 82.1 kg   HEENT:  [place]/AT, No thrush, no icterus CV:  RRR, no rub, no S3, no S4 Lung:  CTA, no wheeze, no rhonchi Abd:  soft/+BS, NT Ext:  No edema, no lymphangitis, no synovitis, no rash   Condition at discharge: stable  The results of significant diagnostics from this hospitalization (including imaging, microbiology, ancillary and laboratory) are listed below for reference.   Imaging Studies: CT  ABDOMEN PELVIS WO CONTRAST  Result Date: 02/17/2023 CLINICAL DATA:  Abdominal pain, distension, leukocytosis EXAM: CT ABDOMEN AND PELVIS WITHOUT CONTRAST TECHNIQUE: Multidetector CT imaging of the abdomen and pelvis was performed following the standard protocol without IV contrast. RADIATION DOSE REDUCTION: This exam was performed according to the departmental dose-optimization program which includes automated exposure control, adjustment of the mA and/or kV according to patient size and/or use of iterative reconstruction technique. COMPARISON:  02/13/2023 FINDINGS: Lower chest: Trace left pleural effusion with mild left basilar atelectasis. Hepatobiliary: Unenhanced liver is unable for calcified granulomata. Status post cholecystectomy. No intrahepatic or extrahepatic ductal dilatation. Pancreas: Within normal limits. Spleen: Within normal limits mid Adrenals/Urinary Tract: Adrenal glands are within normal limits Kidneys are within normal limits. No renal,  ureteral, or bladder calculi. No hydronephrosis. Mildly thick-walled bladder. Stomach/Bowel: Stomach is within normal limits with surgical clips at the GE junction. No evidence of bowel obstruction. Appendix is not discretely visualized. Moderate to large colonic stool burden, suggesting constipation. No superimposed colonic wall thickening to suggest stercoral colitis. Vascular/Lymphatic: No evidence of abdominal aortic aneurysm. Atherosclerotic calcifications of the abdominal aorta and branch vessels. No suspicious abdominopelvic lymphadenopathy. Reproductive: Prostate is unremarkable. Other: No abdominopelvic ascites. Musculoskeletal: Mild degenerative changes of the visualized thoracolumbar spine. Schmorl's node deformities at T12-L1, unchanged. IMPRESSION: Trace left pleural effusion with mild left basilar atelectasis. Otherwise, no interval change from recent CT. Suspected constipation, without superimposed stercoral colitis. Mildly thick-walled bladder,  correlate for cystitis. Electronically Signed   By: Charline Bills M.D.   On: 02/17/2023 17:24   DG Abd 1 View  Result Date: 02/16/2023 CLINICAL DATA:  72 year old male history of emesis. EXAM: ABDOMEN - 1 VIEW COMPARISON:  Abdominal radiograph 02/08/2017. FINDINGS: Gas and stool are seen scattered throughout the colon extending to the level of the distal rectum. No pathologic distension of small bowel is noted. No gross evidence of pneumoperitoneum. Surgical clips project over the epigastric region. IMPRESSION: 1. Nonspecific, nonobstructive bowel gas pattern. 2. No pneumoperitoneum. Electronically Signed   By: Trudie Reed M.D.   On: 02/16/2023 07:24   ECHOCARDIOGRAM COMPLETE  Result Date: 02/14/2023    ECHOCARDIOGRAM REPORT   Patient Name:   [name] Date of Exam: 02/14/2023 Medical Rec #:  [id number]       Height:       73.5 in Accession #:    [id number]      Weight:       180.6 lb Date of Birth:  [birth year]       BSA:          2.071 m Patient Age:    72 years        BP:           115/66 mmHg Patient Gender: M               HR:           65 bpm. [facility] Procedure: 2D Echo, Cardiac Doppler and Color Doppler Indications:    CHF  History:        Patient has prior history of Echocardiogram examinations, most                 recent 12/12/2022. COPD; Aortic Valve Disease.  Sonographer:    Darlys Gales Referring Phys: [id number] OLADAPO ADEFESO IMPRESSIONS  1. Left ventricular ejection fraction, by estimation, is 55 to 60%. The left ventricle has normal function. The left ventricle has no regional wall motion abnormalities.  2. Right ventricular systolic function is normal. The right ventricular size is normal.  3. Left atrial size was mildly dilated.  4. The mitral valve is degenerative. Mild to moderate mitral valve regurgitation. Mild mitral stenosis. The mean mitral valve gradient is 4.7 mmHg. Moderate mitral annular calcification.  5. The aortic valve is calcified. Aortic valve  regurgitation is not visualized. Mild to moderate aortic valve stenosis. Aortic valve area, by VTI measures 1.77 cm. Aortic valve mean gradient measures 14.5 mmHg. Aortic valve Vmax measures 2.63 m/s.  6. The inferior vena cava is normal in size with greater than 50% respiratory variability, suggesting right atrial pressure of 3 mmHg. FINDINGS  Left Ventricle: Left ventricular ejection fraction, by estimation, is 55 to 60%. The left ventricle  has normal function. The left ventricle has no regional wall motion abnormalities. The left ventricular internal cavity size was normal in size. There is  no left ventricular hypertrophy. Left ventricular diastolic function could not be evaluated due to mitral annular calcification (moderate or greater). Right Ventricle: The right ventricular size is normal. No increase in right ventricular wall thickness. Right ventricular systolic function is normal. Left Atrium: Left atrial size was mildly dilated. Right Atrium: Right atrial size was normal in size. Pericardium: There is no evidence of pericardial effusion. Mitral Valve: The mitral valve is degenerative in appearance. Moderate mitral annular calcification. Mild to moderate mitral valve regurgitation. Mild mitral valve stenosis. MV peak gradient, 12.0 mmHg. The mean mitral valve gradient is 4.7 mmHg. Tricuspid Valve: The tricuspid valve is normal in structure. Tricuspid valve regurgitation is not demonstrated. No evidence of tricuspid stenosis. Aortic Valve: The aortic valve is calcified. Aortic valve regurgitation is not visualized. Mild to moderate aortic stenosis is present. Aortic valve mean gradient measures 14.5 mmHg. Aortic valve peak gradient measures 27.7 mmHg. Aortic valve area, by VTI measures 1.77 cm. There is a 29 mm Edwards bioprosthetic valve present in the aortic position. Pulmonic Valve: The pulmonic valve was not well visualized. Pulmonic valve regurgitation is not visualized. No evidence of pulmonic  stenosis. Aorta: The aortic root is normal in size and structure. Venous: The inferior vena cava is normal in size with greater than 50% respiratory variability, suggesting right atrial pressure of 3 mmHg. IAS/Shunts: No atrial level shunt detected by color flow Doppler.  LEFT VENTRICLE PLAX 2D LVIDd:         5.20 cm   Diastology LVIDs:         3.30 cm   LV e' medial:    6.09 cm/s LV PW:         0.80 cm   LV E/e' medial:  17.4 LV IVS:        1.00 cm   LV e' lateral:   7.18 cm/s LVOT diam:     1.90 cm   LV E/e' lateral: 14.8 LV SV:         101 LV SV Index:   49 LVOT Area:     2.84 cm  RIGHT VENTRICLE RV S prime:     14.30 cm/s TAPSE (M-mode): 3.6 cm LEFT ATRIUM             Index        RIGHT ATRIUM           Index LA Vol (A2C):   57.9 ml 27.96 ml/m  RA Area:     18.50 cm LA Vol (A4C):   74.3 ml 35.88 ml/m  RA Volume:   48.60 ml  23.47 ml/m LA Biplane Vol: 68.5 ml 33.08 ml/m  AORTIC VALVE AV Area (Vmax):    1.78 cm AV Area (Vmean):   1.77 cm AV Area (VTI):     1.77 cm AV Vmax:           263.00 cm/s AV Vmean:          178.000 cm/s AV VTI:            0.572 m AV Peak Grad:      27.7 mmHg AV Mean Grad:      14.5 mmHg LVOT Vmax:         165.00 cm/s LVOT Vmean:        111.000 cm/s LVOT VTI:  0.357 m LVOT/AV VTI ratio: 0.62 MITRAL VALVE MV Area (PHT): 1.48 cm     SHUNTS MV Area VTI:   1.54 cm     Systemic VTI:  0.36 m MV Peak grad:  12.0 mmHg    Systemic Diam: 1.90 cm MV Mean grad:  4.7 mmHg MV Vmax:       1.73 m/s MV Vmean:      102.3 cm/s MV Decel Time: 512 msec MV E velocity: 106.00 cm/s MV A velocity: 149.00 cm/s MV E/A ratio:  0.71 Kardie Tobb DO Electronically signed by Thomasene Ripple DO Signature Date/Time: 02/14/2023/12:05:37 PM    Final    CT CHEST ABDOMEN PELVIS WO CONTRAST  Result Date: 02/13/2023 CLINICAL DATA:  Sepsis"brought in by [facility] from home with c/o AMS. Wife reports he was "slower than normal". EMS reports he was alert but confused when they arrived. Normally A&O x 4. Vitals for  EMS-Temp 101, HR 100, NSR, BP 101/67, ETCO2 20. Pt is alert to self, place, and situation, disoriented only to time upon arrival to ED. Pt denies pain. Wife reported to EMS that he has an "aortic infection" and is currently on 2 strong antibiotics, but EMS is unaware of which ones they are. " EXAM: CT CHEST, ABDOMEN AND PELVIS WITHOUT CONTRAST TECHNIQUE: Multidetector CT imaging of the chest, abdomen and pelvis was performed following the standard protocol without IV contrast. RADIATION DOSE REDUCTION: This exam was performed according to the departmental dose-optimization program which includes automated exposure control, adjustment of the mA and/or kV according to patient size and/or use of iterative reconstruction technique. COMPARISON:  12/09/2022. FINDINGS: CT CHEST FINDINGS Cardiovascular: Heart normal in size. No pericardial effusion. Left coronary artery calcifications. Stable aortic valve replacement. Great vessels normal in caliber. Aortic atherosclerotic calcifications. Mediastinum/Nodes: No neck base, mediastinal or hilar masses. No enlarged lymph nodes. Trachea esophagus are unremarkable. Lungs/Pleura: Minimal dependent subsegmental atelectasis. No evidence of pneumonia or pulmonary edema. No lung mass. No suspicious nodule. No pleural effusion or pneumothorax. Musculoskeletal: No fracture or acute finding. No bone lesion. No chest wall mass. CT ABDOMEN PELVIS FINDINGS Hepatobiliary: Unremarkable liver. Status post cholecystectomy. No bile duct dilation. Pancreas: Pancreatic atrophy. No mass, duct dilation or inflammation. Spleen: Normal in size without focal abnormality. Adrenals/Urinary Tract: No adrenal mass. Kidneys normal in size, orientation and position. 12 mm low-attenuation renal mass, midpole of the right kidney, consistent with a cyst, no follow-up recommended. No other renal mass. No stone. No hydronephrosis. Ureters are normal in course and in caliber. Bladder is unremarkable.  Stomach/Bowel: Surgical vascular clips at the gastroesophageal junction, stable. Stomach mostly decompressed, otherwise unremarkable. Small bowel normal in caliber. Scattered small bowel air-fluid levels. No small bowel wall thickening or inflammation. Colon is distended with marked increased stool burden. Right colon measures up to 9.6 cm in diameter. No colonic wall thickening or inflammation. No evidence of obstruction. Stool moderately distends the rectum. Vascular/Lymphatic: Aortic atherosclerosis. No enlarged lymph nodes. Reproductive: Prostate normal in size. Other: No abdominal wall hernia or abnormality. No abdominopelvic ascites. Musculoskeletal: No acute fracture or acute finding. Mild depression of the upper endplate of L1 with associated Schmorl's nodes and sclerosis, stable from the prior exam. Stable degenerative changes. No aggressive bone lesion. IMPRESSION: 1. No acute findings within the chest, abdomen or pelvis. No source of sepsis. 2. Marked increased colonic stool consistent with constipation. No bowel obstruction or inflammation. 3. Multiple stable chronic findings detailed above. Electronically Signed   By: Renard Hamper.D.  On: 02/13/2023 15:31   CT Head Wo Contrast  Result Date: 02/13/2023 CLINICAL DATA:  Mental status change"brought in by [facility] from home with c/o AMS. Wife reports he was "slower than normal". EMS reports he was alert but confused when they arrived. Normally A&O x 4. Vitals for EMS-Temp 101, HR 100, NSR, BP 101/67, ETCO2 20. Pt is alert to self, place, and situation, disoriented only to time upon arrival to ED. Pt denies pain. Wife reported to EMS that he has an "aortic infection" and is currently on 2 strong antibiotics, but EMS is unaware of which ones they are. " EXAM: CT HEAD WITHOUT CONTRAST TECHNIQUE: Contiguous axial images were obtained from the base of the skull through the vertex without intravenous contrast. RADIATION DOSE REDUCTION: This exam was  performed according to the departmental dose-optimization program which includes automated exposure control, adjustment of the mA and/or kV according to patient size and/or use of iterative reconstruction technique. COMPARISON:  12/09/2022. FINDINGS: Brain: No evidence of acute infarction, hemorrhage, hydrocephalus, extra-axial collection or mass lesion/mass effect. Ventricle and sulcal enlargement reflecting moderate atrophy. Focus of hypoattenuation noted in posterior left frontal lobe consistent with an old infarct, stable. Vascular: No hyperdense vessel or unexpected calcification. Skull: Normal. Negative for fracture or focal lesion. Sinuses/Orbits: Globes and orbits are unremarkable. Moderate mucosal thickening lines the right frontal and bilateral ethmoids sinuses with mild mucosal thickening along the anterior sphenoid and bilateral maxillary sinuses. Other: None. IMPRESSION: 1. No acute intracranial abnormalities. 2. Stable intracranial appearance from the prior head CT. Electronically Signed   By: Amie Portland M.D.   On: 02/13/2023 15:21   DG Chest Port 1 View  Result Date: 02/13/2023 CLINICAL DATA:  Sepsis EXAM: PORTABLE CHEST 1 VIEW COMPARISON:  12/11/2022 FINDINGS: Single frontal view of the chest demonstrates a stable cardiac silhouette. Aortic valve prosthesis again noted. Pulmonary vascular congestion, with patchy bibasilar consolidation left greater than right. No effusion or pneumothorax. No acute bony abnormalities. IMPRESSION: 1. Patchy bibasilar consolidation, left greater than right, which could reflect infection or asymmetric edema. 2. Increased pulmonary vascular congestion. Electronically Signed   By: Sharlet Salina M.D.   On: 02/13/2023 14:14    Microbiology: Results for orders placed or performed during the hospital encounter of 02/13/23  Blood Culture (routine x 2)     Status: None   Collection Time: 02/13/23  2:05 PM   Specimen: BLOOD RIGHT ARM  Result Value Ref Range Status    Specimen Description   Final    BLOOD RIGHT ARM BOTTLES DRAWN AEROBIC AND ANAEROBIC   Special Requests Blood Culture adequate volume  Final   Culture   Final    NO GROWTH 5 DAYS Performed at [facility], [address] [place], [place] [address]    Report Status 02/18/2023 FINAL  Final  Blood Culture (routine x 2)     Status: None   Collection Time: 02/13/23  2:35 PM   Specimen: BLOOD  Result Value Ref Range Status   Specimen Description BLOOD BLOOD LEFT HAND  Final   Special Requests NONE  Final   Culture   Final    NO GROWTH 5 DAYS Performed at [facility], [address], [place], [place] [address]    Report Status 02/18/2023 FINAL  Final  SARS Coronavirus 2 by RT PCR (hospital order, performed in [facility] hospital lab) *cepheid single result test* Anterior Nasal Swab     Status: None   Collection Time: 02/13/23  3:41 PM  Specimen: Anterior Nasal Swab  Result Value Ref Range Status   SARS Coronavirus 2 by RT PCR NEGATIVE NEGATIVE Final    Comment: (NOTE) SARS-CoV-2 target nucleic acids are NOT DETECTED.  The SARS-CoV-2 RNA is generally detectable in upper and lower respiratory specimens during the acute phase of infection. The lowest concentration of SARS-CoV-2 viral copies this assay can detect is 250 copies / mL. A negative result does not preclude SARS-CoV-2 infection and should not be used as the sole basis for treatment or other patient management decisions.  A negative result may occur with improper specimen collection / handling, submission of specimen other than nasopharyngeal swab, presence of viral mutation(s) within the areas targeted by this assay, and inadequate number of viral copies (<250 copies / mL). A negative result must be combined with clinical observations, patient history, and epidemiological information.  Fact Sheet for Patients:   RoadLapTop.co.za  Fact Sheet for Healthcare  Providers: http://kim-miller.com/  This test is not yet approved or  cleared by the [place] FDA and has been authorized for detection and/or diagnosis of SARS-CoV-2 by FDA under an Emergency Use Authorization (EUA).  This EUA will remain in effect (meaning this test can be used) for the duration of the COVID-19 declaration under Section 564(b)(1) of the Act, 21 U.S.C. section 360bbb-3(b)(1), unless the authorization is terminated or revoked sooner.  Performed at [facility], [address], [place], [place] [address]   Urine Culture     Status: Abnormal   Collection Time: 02/13/23  3:54 PM   Specimen: Urine, Random  Result Value Ref Range Status   Specimen Description   Final    URINE, RANDOM Performed at [facility], [address], [place], [place] [address]    Special Requests   Final    NONE Reflexed from [id number] Performed at [facility], [address] [place], [place] [address]    Culture (A)  Final    >=100,000 COLONIES/mL ESCHERICHIA COLI Confirmed Extended Spectrum Beta-Lactamase Producer (ESBL).  In bloodstream infections from ESBL organisms, carbapenems are preferred over piperacillin/tazobactam. They are shown to have a lower risk of mortality.    Report Status 02/15/2023 FINAL  Final   Organism ID, Bacteria ESCHERICHIA COLI (A)  Final      Susceptibility   Escherichia coli - MIC*    AMPICILLIN >=32 RESISTANT Resistant     CEFAZOLIN >=64 RESISTANT Resistant     CEFEPIME >=32 RESISTANT Resistant     CEFTRIAXONE >=64 RESISTANT Resistant     CIPROFLOXACIN >=4 RESISTANT Resistant     GENTAMICIN <=1 SENSITIVE Sensitive     IMIPENEM <=0.25 SENSITIVE Sensitive     NITROFURANTOIN 128 RESISTANT Resistant     TRIMETH/SULFA <=20 SENSITIVE Sensitive     AMPICILLIN/SULBACTAM >=32 RESISTANT Resistant     PIP/TAZO 8 SENSITIVE Sensitive     * >=100,000 COLONIES/mL ESCHERICHIA COLI  Culture, blood (Routine X 2) w Reflex to ID Panel     Status: None  (Preliminary result)   Collection Time: 02/17/23  2:15 PM   Specimen: BLOOD  Result Value Ref Range Status   Specimen Description BLOOD BLOOD LEFT ARM  Final   Special Requests   Final    BOTTLES DRAWN AEROBIC AND ANAEROBIC Blood Culture results may not be optimal due to an excessive volume of blood received in culture bottles   Culture   Final    NO GROWTH 2 DAYS Performed at [facility], [address], [place], [place] [address]  Report Status PENDING  Incomplete  Culture, blood (Routine X 2) w Reflex to ID Panel     Status: None (Preliminary result)   Collection Time: 02/17/23  2:20 PM   Specimen: BLOOD  Result Value Ref Range Status   Specimen Description BLOOD BLOOD RIGHT HAND  Final   Special Requests   Final    BOTTLES DRAWN AEROBIC AND ANAEROBIC Blood Culture adequate volume   Culture   Final    NO GROWTH 2 DAYS Performed at [facility], [address] [place], [place] [address]    Report Status PENDING  Incomplete    Labs: CBC: Recent Labs  Lab 02/13/23 1405 02/14/23 0453 02/15/23 0438 02/16/23 0505 02/17/23 0408 02/18/23 0416 02/19/23 0428  WBC 26.7*   < > 15.5* 15.1* 23.7* 18.7* 17.1*  NEUTROABS 25.1*  --  12.4* 12.1* 20.9*  --   --   HGB 9.5*   < > 9.1* 9.5* 9.1* 9.2* 9.2*  HCT 29.7*   < > 29.6* 31.3* 29.5* 29.2* 29.3*  MCV 77.1*   < > 79.8* 79.0* 78.0* 77.7* 78.6*  PLT 382   < > 399 447* 437* 415* 444*   < > = values in this interval not displayed.   Basic Metabolic Panel: Recent Labs  Lab 02/14/23 0453 02/15/23 0438 02/16/23 0505 02/17/23 0408 02/18/23 0416 02/19/23 0428  NA 130* 134* 132* 126* 129* 130*  K 3.0* 4.2 3.6 3.5 3.9 4.8  CL 97* 101 101 96* 98 98  CO2 23 23 22  21* 22 23  GLUCOSE 133* 94 87 104* 93 98  BUN 14 12 12 12 13 19   CREATININE 0.77 0.77 0.70 0.85 0.84 1.09  CALCIUM 8.7* 8.7* 8.9 8.5* 8.6* 8.7*  MG 1.9  --   --  1.8 1.8 2.2  PHOS 3.2  --   --   --   --   --    Liver Function Tests: Recent Labs  Lab 02/13/23 1405  02/14/23 0453  AST 19 17  ALT 19 17  ALKPHOS 88 76  BILITOT 0.5 0.7  PROT 7.5 7.1  ALBUMIN 2.9* 2.6*   CBG: Recent Labs  Lab 02/15/23 1553  GLUCAP 100*    Discharge time spent: greater than 30 minutes.  Signed: Catarina Hartshorn, MD [facility] 02/19/2023

## 2023-02-19 NOTE — Progress Notes (Signed)
PT Cancellation Note  Patient Details Name: [name] MRN: [id number] DOB: 08-Apr-1951   Cancelled Treatment:    Reason Eval/Treat Not Completed: Other (comment) Attempted PT several times through out day.  Pt limited by nausea and requested to hold on therapy until feels better.  Pt left with call bell within reach.  RN aware  Becky Sax, LPTA/CLT; CBIS [phone]   Juel Burrow 02/19/2023, 3:11 PM

## 2023-02-19 NOTE — Progress Notes (Signed)
Patient has complained of nausea several times during this shift. Prn medications that are available given to patient with little relief.  Patient did not drink Nulytely due to have nausea per his statement. Plan of care ongoing.

## 2023-02-22 LAB — CULTURE, BLOOD (ROUTINE X 2)
Culture: NO GROWTH
Culture: NO GROWTH
Special Requests: ADEQUATE

## 2023-03-23 ENCOUNTER — Ambulatory Visit: Payer: Medicare HMO | Admitting: Gastroenterology

## 2023-03-23 NOTE — Progress Notes (Deleted)
GI Office Note    Referring Provider: [name], MD Primary Care Physician:  [name], MD  Primary Gastroenterologist:  Chief Complaint   No chief complaint on file.   History of Present Illness   [name] is a 72 y.o. male presenting today for hospital follow-up.  Seen last month for constipation.  Admitted with altered mental status and fever, UTI with ESBL E. coli.  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  States he has had at least 8 revisions for bowel obstructions, approximately 10+ years ago.  Chronically on morphine as needed for back pain although he tries not to use this daily.  States he never gets an urge to have a bowel movement, he has to monitor for lower abdominal fullness and stay on top of things with use of daily stool softener, MiraLAX every other day.***Previously did well on Linzess but coverage was an issue.  During admission he had 2 CTs without obstruction however noted to have marked stool burden, initially noted to have right colon measuring up to 9.6 cm.      Colonoscopy 2011, normal study, recommended surveillance colonoscopy 7 years due to previous adenomatous colon polyps in 2001. Patient states that he had a colonoscopy since then but he does not remember when, where or what was found.   EGD 2007: Billroth I anatomy, distal esophageal stricture status post balloon dilation.  Medications   Current Outpatient Medications  Medication Sig Dispense Refill   albuterol (PROVENTIL) (2.5 MG/3ML) 0.083% nebulizer solution Take 2.5 mg by nebulization every 6 (six) hours as needed for wheezing or shortness of breath.     albuterol (VENTOLIN HFA) 108 (90 Base) MCG/ACT inhaler Inhale 1-2 puffs into the lungs every 4 (four) hours as needed for wheezing or shortness  of breath.     ALPRAZolam (XANAX) 0.5 MG tablet Take 1 tablet (0.5 mg total) by mouth 3 (three) times daily as needed for anxiety or sleep. 10 tablet 0   apixaban (ELIQUIS) 5 MG TABS tablet Take 5 mg by mouth 2 (two) times daily.     Cholecalciferol (VITAMIN D-3) 125 MCG (5000 UT) TABS Take 5,000 Units by mouth daily.     cyclobenzaprine (FLEXERIL) 5 MG tablet Take 5 mg by mouth 3 (three) times daily as needed.     EPINEPHrine 0.3 mg/0.3 mL IJ SOAJ injection Inject 0.3 mg into the muscle once as needed for anaphylaxis.     ferrous sulfate 325 (65 FE) MG tablet Take 325 mg by mouth daily.     fluticasone-salmeterol (ADVAIR) 250-50 MCG/ACT AEPB Inhale 1 puff into the lungs in the morning and at bedtime. Rinse mouth well with water after each use     folic acid (FOLVITE) 1 MG tablet Take 1 tablet (1 mg total) by mouth daily.     levothyroxine (SYNTHROID) 25 MCG tablet Take 1 tablet (25 mcg total) by mouth daily at 6 (six) AM. 30 tablet 0   lidocaine (LIDODERM) 5 % Place 1 patch onto the skin daily. Remove & Discard patch within 12 hours or as directed by MD (Patient taking differently: Place 1 patch onto the skin daily as needed (FOR PAIN). Remove & Discard patch within 12 hours or as directed by MD) 5 patch 0   linaclotide (LINZESS) 290 MCG CAPS capsule Take 1 capsule (  290 mcg total) by mouth daily before breakfast. 30 capsule 1   morphine (MSIR) 15 MG tablet Take 1 tablet (15 mg total) by mouth every 4 (four) hours as needed for severe pain. (Patient taking differently: Take 15 mg by mouth 4 (four) times daily.) 10 tablet 0   Multiple Vitamin (MULTIVITAMIN WITH MINERALS) TABS tablet Take 1 tablet by mouth at bedtime.     nitroGLYCERIN (NITROSTAT) 0.4 MG SL tablet Place 1 tablet (0.4 mg total) under the tongue every 5 (five) minutes as needed for chest pain. 25 tablet 1   polyethylene glycol powder (GLYCOLAX/MIRALAX) 17 GM/SCOOP powder Take 1 Container by mouth 2 (two) times daily as needed for mild  constipation or moderate constipation.     promethazine (PHENERGAN) 25 MG tablet Take 25 mg by mouth 4 (four) times daily as needed for nausea or vomiting.     rosuvastatin (CRESTOR) 40 MG tablet Take 40 mg by mouth daily.     sertraline (ZOLOFT) 100 MG tablet Take 150 mg by mouth daily.     vitamin B-12 (VITAMIN B12) 500 MCG tablet Take 1 tablet (500 mcg total) by mouth daily.     No current facility-administered medications for this visit.    Allergies   Allergies as of 03/23/2023 - Review Complete 02/13/2023  Allergen Reaction Noted   Bee venom Anaphylaxis 09/03/2016   Bystolic [nebivolol hcl] Other (See Comments) 11/25/2011   Codeine Shortness Of Breath and Rash 01/21/2007   Doxycycline Shortness Of Breath and Rash 07/22/2007   Iodinated contrast media Shortness Of Breath and Anaphylaxis 03/12/2011   Iohexol Hives, Shortness Of Breath, and Rash 08/19/2004   Levaquin [levofloxacin] Hives and Shortness Of Breath 04/08/2015   Oxycodone hcl Shortness Of Breath, Swelling, and Rash 07/22/2007   Stadol [butorphanol] Shortness Of Breath 01/18/2017   Succinylcholine chloride Shortness Of Breath, Nausea Only, Rash, and Other (See Comments) 04/07/2011   Tamiflu [oseltamivir phosphate]  11/02/2017   Atorvastatin  01/16/2008   Synvisc [hylan g-f 20] Hives and Rash 03/12/2011   Gemfibrozil  09/07/2009   Latex Other (See Comments) 07/22/2007   Lisinopril  12/07/2015   Omeprazole  08/07/2003   Dilaudid [hydromorphone hcl] Nausea Only and Rash 03/12/2011   Methocarbamol Nausea Only and Rash 03/12/2011   Niacin Rash 12/07/2015   Pentazocine lactate Rash 03/12/2011     Past Medical History   Past Medical History:  Diagnosis Date   Anemia    Asthma    Back pain, chronic, followed at pain clinic 11/25/2011   Carotid artery stenosis 09/06/2014   By Dopplers in September 2017: Right ICA 40-59%.  Less than 40% left ICA.   Colon polyps    COPD (chronic obstructive pulmonary disease) (HCC)     Dementia (HCC)    Mild   Depression    Dyslipidemia (high LDL; low HDL)    statin intolerant; on fibrate   Edema leg from Venous Stasis    Venous stasis :wears compression stockings; 08/2012 dopplers - no DVT or thrombophlebitis; mild R Popliteal V reflux - no VNUS ablation candidate   GERD (gastroesophageal reflux disease)    Hearing loss    History of CVA (cerebrovascular accident)    History of diabetes mellitus    History of DVT (deep vein thrombosis)    Hypertension    very labile   Nephrolithiasis    Osteoarthritis    Other idiopathic peripheral autonomic neuropathy    Agent orange   PAF (paroxysmal atrial fibrillation) (HCC)  PONV (postoperative nausea and vomiting)    PTSD (post-traumatic stress disorder) 07/24/2015   per patient approach from foot of bed to awake; do not apply any contricting pressure, also avoid approaching from behind with any loud noises    S/P TAVR (transcatheter aortic valve replacement) 08/08/2019   s/p TAVR w/ a 29 mm Edwards Sapien 3 THV via the TF approach by Drs Excell Seltzer and Cornelius Moras   Seizure [facility])    Severe aortic stenosis    Skull fracture (HCC)    Small bowel obstruction (HCC) 1990s, 2001, 2015   s/p multiple bowel surgeries; from war wounds    Past Surgical History   Past Surgical History:  Procedure Laterality Date   APPENDECTOMY  1958   per patient   Carotid Dopplers  01/2016   Stable. RICA - slight progression from<40% to 40-59%.  Otherwise stable bilateral carotids and subclavian arteries. Stable LICA   COLONOSCOPY     DENTAL SURGERY  01/14/2017   5 extractions in preparation for LTKA on 01-25-17; also was started on amoxicillin x 7days; has since completed therapy    exploratory laparotomy with extensive lysis of adhesions  10/1999   JOINT REPLACEMENT     right knee replaced 2x   LEFT HEART CATHETERIZATION WITH CORONARY ANGIOGRAM N/A 11/26/2011   WNL Runell Gess, MD   NM MYOVIEW LTD  08/2011   Negative lexiscan myoview: No  ischemia or infarction   RIGHT/LEFT HEART CATH AND CORONARY ANGIOGRAPHY N/A 10/22/2016   Procedure: Right/Left Heart Cath and Coronary Angiography;  Surgeon: Marykay Lex, MD;  Location: [facility] INVASIVE CV LAB:: Angiographically normal/minimal CAD with tortuous coronary arteries.  Mild pulmonary pretension secondary to severely elevated LVEDP and systemic hypertension.  Mild-moderate aortic valve stenosis with mean gradient 21 mmHg.   RIGHT/LEFT HEART CATH AND CORONARY ANGIOGRAPHY N/A 07/13/2019   Procedure: RIGHT/LEFT HEART CATH AND CORONARY ANGIOGRAPHY;  Surgeon: Marykay Lex, MD;  Location: [facility] INVASIVE CV LAB;  Service: Cardiovascular;; : Angiographically normal coronary arteries.  Hemodynamic findings consistent with severe stenosis.  Normal RHC Pressures.   small bowel obstruction  1996, 1999, 2001, 2015   TEE WITHOUT CARDIOVERSION N/A 08/08/2019   Procedure: TRANSESOPHAGEAL ECHOCARDIOGRAM (TEE);  Surgeon: Tonny Bollman, MD;  Location: [facility] INVASIVE CV LAB;  Service: Open Heart Surgery;  Laterality: N/A;   TEE WITHOUT CARDIOVERSION N/A 12/14/2022   Procedure: TRANSESOPHAGEAL ECHOCARDIOGRAM;  Surgeon: Chrystie Nose, MD;  Location: [facility] INVASIVE CV LAB;  Service: Cardiovascular;  Laterality: N/A;   TONSILLECTOMY  1960   per patient   TOTAL KNEE ARTHROPLASTY     TOTAL KNEE ARTHROPLASTY Left 01/25/2017   Procedure: LEFT TOTAL KNEE ARTHROPLASTY;  Surgeon: Ollen Gross, MD;  Location: [facility] ORS;  Service: Orthopedics;  Laterality: Left;  Adductor Block   TRANSCATHETER AORTIC VALVE REPLACEMENT, TRANSFEMORAL N/A 08/08/2019   Procedure: TRANSCATHETER AORTIC VALVE REPLACEMENT, TRANSFEMORAL;  Surgeon: Tonny Bollman, MD;  Location: [facility] INVASIVE CV LAB;  Service: Open Heart Surgery;  Laterality: N/A;   TRANSTHORACIC ECHOCARDIOGRAM  06/07/2019    Severe calcific aortic stenosis-mean gradient up to 49 mmHg the peak of 79 mmHg.Mild-Mod AI.  Normal LV size and function.  EF 65 to 70%.  Mild concentric LVH  with GR 1 DD.  Biatrial size-normal.  Moderate mitral valve calcification with mild MAC and mild MR -> compared to June 2020-increase in both gradients from mean 42 mmHg and peak 66 mmHg   TRANSTHORACIC ECHOCARDIOGRAM  07/2019   a) post TAVR echo 08/09/2019:POD #1 -  EF 60-65%, nl fxn TAVR with a mAVg of 14 mm Hg and no PVL.;;  09/07/2019:  o 1 month echo showed EF 65%, normally functioning TAVR with a mean gradient of 13 mm Hg and no PVL.   TRANSTHORACIC ECHOCARDIOGRAM  09/04/2020   1 year post TAVR:  EF 70 to 75%.  Hyperdynamic LV function.  GR 1 DD-mild LA dilation.[name]  No RWMA.  Normal RV function.  Unable to assess PAP-mildly elevated RAP.  TAVR valve in place.  Mean gradient 11 mmHg no PVL.  Mild ascending aortic dilation.  No change from previous echo   WRIST FRACTURE SURGERY      Past Family History   Family History  Problem Relation Age of Onset   Stroke Mother    Heart disease Mother    Heart attack Mother    Heart disease Father    Heart attack Father    Heart attack Sister    Heart attack Brother    Hypertension Brother    Heart attack Son    Stroke Son    Hypertension Brother    Sleep apnea Son    Non-Hodgkin's lymphoma Daughter     Past Social History   Social History   Socioeconomic History   Marital status: Married    Spouse name: Not on file   Number of children: 3   Years of education: Not on file   Highest education level: Not on file  Occupational History   Occupation: Disabled  Tobacco Use   Smoking status: Never   Smokeless tobacco: Former  Building services engineer status: Never Used  Substance and Sexual Activity   Alcohol use: Yes    Comment: occasional beer    Drug use: No   Sexual activity: Yes  Other Topics Concern   Not on file  Social History Narrative   He is a father of 3, grandfather 5.    One of his sons was killed during the war in [place]. Grandson killed bike accident 2017.   He was exposed to [employer].   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   Not very physically active due to extreme disability/debilitation from osteoarthritis and peripheral neuropathy.   Social Determinants of Health   Financial Resource Strain: Not on file  Food Insecurity: No Food Insecurity (02/13/2023)   Hunger Vital Sign    Worried About Running Out of Food in the Last Year: Never true    Ran Out of Food in the Last Year: Never true  Transportation Needs: No Transportation Needs (02/13/2023)   PRAPARE - Administrator, Civil Service (Medical): No    Lack of Transportation (Non-Medical): No  Physical Activity: Not on file  Stress: Not on file  Social Connections: Not on file  Intimate Partner Violence: Not At Risk (02/13/2023)   Humiliation, Afraid, Rape, and Kick questionnaire    Fear of Current or Ex-Partner: No    Emotionally Abused: No    Physically Abused: No    Sexually Abused: No    Review of Systems   General: Negative for anorexia, weight loss, fever, chills, fatigue, weakness. ENT: Negative for hoarseness, difficulty swallowing , nasal congestion. CV: Negative for chest pain, angina, palpitations, dyspnea on exertion, peripheral edema.  Respiratory: Negative for dyspnea at rest, dyspnea on exertion, cough, sputum, wheezing.  GI: See history of present illness. GU:  Negative for dysuria, hematuria, urinary incontinence, urinary frequency,  nocturnal urination.  Endo: Negative for unusual weight change.     Physical Exam   There were no vitals taken for this visit.   General: Well-nourished, well-developed in no acute distress.  Eyes: No icterus. Mouth: Oropharyngeal mucosa moist and pink , no lesions erythema or exudate. Lungs: Clear to auscultation bilaterally.  Heart: Regular rate and rhythm, no murmurs rubs or gallops.  Abdomen: Bowel sounds are normal, nontender, nondistended, no hepatosplenomegaly or masses,  no abdominal bruits or hernia , no  rebound or guarding.  Rectal: ***  Extremities: No lower extremity edema. No clubbing or deformities. Neuro: Alert and oriented x 4   Skin: Warm and dry, no jaundice.   Psych: Alert and cooperative, normal mood and affect.  Labs   Lab Results  Component Value Date   NA 130 (L) 02/19/2023   CL 98 02/19/2023   K 4.8 02/19/2023   CO2 23 02/19/2023   BUN 19 02/19/2023   CREATININE 1.09 02/19/2023   GFRNONAA >60 02/19/2023   CALCIUM 8.7 (L) 02/19/2023   PHOS 3.2 02/14/2023   ALBUMIN 2.6 (L) 02/14/2023   GLUCOSE 98 02/19/2023   Lab Results  Component Value Date   WBC 17.1 (H) 02/19/2023   HGB 9.2 (L) 02/19/2023   HCT 29.3 (L) 02/19/2023   MCV 78.6 (L) 02/19/2023   PLT 444 (H) 02/19/2023   Lab Results  Component Value Date   ALT 17 02/14/2023   AST 17 02/14/2023   ALKPHOS 76 02/14/2023   BILITOT 0.7 02/14/2023   Lab Results  Component Value Date   FOLATE 9.5 02/17/2023   Lab Results  Component Value Date   VITAMINB12 235 02/17/2023   Lab Results  Component Value Date   IRON 20 (L) 02/14/2023   TIBC 218 (L) 02/14/2023   FERRITIN 284 02/14/2023   Lab Results  Component Value Date   TSH 1.792 02/17/2023    Imaging Studies   No results found.  Assessment       PLAN   ***   Leanna Battles. Melvyn Neth, MHS, PA-C [facility]

## 2023-03-24 ENCOUNTER — Encounter: Payer: Self-pay | Admitting: Gastroenterology

## 2023-05-20 ENCOUNTER — Ambulatory Visit: Payer: Medicare HMO | Attending: Physician Assistant | Admitting: Emergency Medicine

## 2023-05-20 ENCOUNTER — Encounter: Payer: Self-pay | Admitting: Physician Assistant

## 2023-05-20 VITALS — BP 172/84 | HR 100 | Ht 73.5 in | Wt 187.0 lb

## 2023-05-20 DIAGNOSIS — I4819 Other persistent atrial fibrillation: Secondary | ICD-10-CM

## 2023-05-20 DIAGNOSIS — R600 Localized edema: Secondary | ICD-10-CM | POA: Diagnosis not present

## 2023-05-20 DIAGNOSIS — I110 Hypertensive heart disease with heart failure: Secondary | ICD-10-CM

## 2023-05-20 DIAGNOSIS — I5032 Chronic diastolic (congestive) heart failure: Secondary | ICD-10-CM | POA: Diagnosis not present

## 2023-05-20 DIAGNOSIS — Z952 Presence of prosthetic heart valve: Secondary | ICD-10-CM

## 2023-05-20 DIAGNOSIS — E785 Hyperlipidemia, unspecified: Secondary | ICD-10-CM

## 2023-05-20 DIAGNOSIS — Z79899 Other long term (current) drug therapy: Secondary | ICD-10-CM | POA: Diagnosis not present

## 2023-05-20 DIAGNOSIS — I330 Acute and subacute infective endocarditis: Secondary | ICD-10-CM

## 2023-05-20 DIAGNOSIS — I10 Essential (primary) hypertension: Secondary | ICD-10-CM

## 2023-05-20 MED ORDER — CARVEDILOL 6.25 MG PO TABS: 6.2500 mg | ORAL_TABLET | Freq: Two times a day (BID) | ORAL | 3 refills | Status: DC

## 2023-05-20 MED ORDER — NITROGLYCERIN 0.4 MG SL SUBL: 0.4000 mg | SUBLINGUAL_TABLET | SUBLINGUAL | 3 refills | Status: DC | PRN

## 2023-05-20 MED ORDER — ROSUVASTATIN CALCIUM 40 MG PO TABS: 40.0000 mg | ORAL_TABLET | Freq: Every day | ORAL | 3 refills | Status: DC

## 2023-05-20 MED ORDER — APIXABAN 5 MG PO TABS: 5.0000 mg | ORAL_TABLET | Freq: Two times a day (BID) | ORAL | 11 refills | Status: DC

## 2023-05-20 NOTE — Patient Instructions (Addendum)
Medication Instructions:  Restart Carvedilol (Coreg) 6.25 mg one tablet twice daily.  *If you need a refill on your cardiac medications before your next appointment, please call your pharmacy*   Lab Work: BMET, CBC, Magnesium at your convenience    Testing/Procedures: NONE ordered at this time of appointment   Follow-Up: At [facility], you and your health needs are our priority.  As part of our continuing mission to provide you with exceptional heart care, we have created designated Provider Care Teams.  These Care Teams include your primary Cardiologist (physician) and Advanced Practice Providers (APPs -  Physician Assistants and Nurse Practitioners) who all work together to provide you with the care you need, when you need it.  We recommend signing up for the patient portal called "MyChart".  Sign up information is provided on this After Visit Summary.  MyChart is used to connect with patients for Virtual Visits (Telemedicine).  Patients are able to view lab/test results, encounter notes, upcoming appointments, etc.  Non-urgent messages can be sent to your provider as well.   To learn more about what you can do with MyChart, go to [email or web address].    Your next appointment:    2 week 1st available with [facility]  Provider:   Micah Flesher, PA-C        Other Instructions
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
## 2023-06-04 ENCOUNTER — Ambulatory Visit: Payer: Medicare HMO | Attending: Physician Assistant | Admitting: Physician Assistant

## 2023-06-07 ENCOUNTER — Encounter: Payer: Self-pay | Admitting: Physician Assistant

## 2023-07-02 ENCOUNTER — Ambulatory Visit: Payer: Medicare HMO | Admitting: Cardiology

## 2023-08-12 LAB — LAB REPORT - SCANNED
A1c: 5.6
EGFR: 91
TSH: 2.12 (ref 0.41–5.90)

## 2023-08-16 ENCOUNTER — Ambulatory Visit: Payer: Medicare HMO | Attending: Cardiology | Admitting: Cardiology

## 2023-08-18 ENCOUNTER — Encounter: Payer: Self-pay | Admitting: Cardiology

## 2024-01-12 ENCOUNTER — Ambulatory Visit: Payer: Self-pay | Admitting: Internal Medicine

## 2024-01-12 DIAGNOSIS — R0602 Shortness of breath: Secondary | ICD-10-CM

## 2024-01-12 NOTE — Telephone Encounter (Signed)
 Patient and patient's wife requesting advice from Dr Neysa at this time on his recommendations or possibly sending in prescriptions   FYI Only or Action Required?: Action required by provider: clinical question for provider and update on patient condition.  Patient is followed in Pulmonology for COPD, last seen on 09/25/2021 by Neysa Reggy BIRCH, MD.  Called Nurse Triage reporting Shortness of Breath.  Symptoms began off and on for a month but worse lately.  Interventions attempted: Rescue inhaler, Maintenance inhaler, and Nebulizer treatments.  Symptoms are: gradually worsening.  Triage Disposition: Go to ED Now (or PCP Triage)  Patient/caregiver understands and will follow disposition?: No, wishes to speak with PCP        Copied from CRM #[id number]. Topic: Clinical - Red Word Triage >> Jan 12, 2024  1:04 PM Rozanna MATSU wrote: [facility] that prompted transfer to Nurse Triage: SHORTNESS OF BREATH, COUGHING, PT IS HAVING CHEST PAIN WITH THE COUGH, CONGESTION Reason for Disposition  Patient sounds very sick or weak to the triager    Pain in chest when breathing in Speaking in shorter sentences during triage Patient had to use a nebulizer treatment just before speaking with this RN for Triage  Answer Assessment - Initial Assessment Questions Patient states that he feels a little better after using his nebulizer but he is still experiencing generalized malaise He states that his chest hurts when he breathes in and he believes that this may be from coughing so much. Symptoms off and on for a month but worse lately  Patient and his wife are advised that at this time the recommendation is that the patient go to the Emergency Room. Patient states that he doesn't want to go to the Emergency Room at this time and he might reach out to his PCP if he gets worse. He and his wife also wanted to know Dr Saundra advice Wife states [facility] on [address] is their pharmacy if Dr Neysa decides to  send in any prescriptions. She states that the patient is not on any antibiotics at this time and she would like further instruction on how for the patient to use his nebulizer and inhaler at this time.     [facility] Triage - Initial Assessment Questions Chief Complaint (e.g., cough, sob, wheezing, fever, chills, sweat or additional symptoms) *Go to specific symptom protocol after initial questions. Shortness of breath, persistent dry cough  How long have symptoms been present? Off and on for a month  Have you tested for COVID or Flu? Note: If not, ask patient if a home test can be taken. If so, instruct patient to call back for positive results. No  MEDICINES:   Have you used any OTC meds to help with symptoms? Yes If yes, ask What medications? Allergy relief  Have you used your inhalers/maintenance medication? Yes If yes, What medications? Albuterol  Nebulizer--Take 2.5 mg by nebulization every 6 (six) hours as needed for wheezing or shortness of breath.  Albuterol  Inhaler-- Inhale 1-2 puffs into the lungs every 4 (four) hours as needed for wheezing or shortness of breath   If inhaler, ask How many puffs and how often? Note: Review instructions on medication in the chart. Albuterol  Nebulizer--Take 2.5 mg by nebulization every 6 (six) hours as needed for wheezing or shortness of breath.  Albuterol  Inhaler-- Inhale 1-2 puffs into the lungs every 4 (four) hours as needed for wheezing or shortness of breath   OXYGEN : Do you wear supplemental oxygen ? No If yes, How  many liters are you supposed to use? N/A  Do you monitor your oxygen  levels? Yes If yes, What is your reading (oxygen  level) today? 98% and above when wife has checked it 98% and 78 heart rate after breathing treatment during triage--patient didn't want his wife to check this prior to his breathing treatment  What is your usual oxygen  saturation reading?  (Note: Pulmonary O2 sats should be 90%  or greater) -----     3. PATTERN Does the difficult breathing come and go, or has it been constant since it started?      ------ 4. SEVERITY: How bad is your breathing? (e.g., mild, moderate, severe)      Used the nebulizer during triage 5. RECURRENT SYMPTOM: Have you had difficulty breathing before? If Yes, ask: When was the last time? and What happened that time?      ---- 6. CARDIAC HISTORY: Do you have any history of heart disease? (e.g., heart attack, angina, bypass surgery, angioplasty)      Significant 7. LUNG HISTORY: Do you have any history of lung disease?  (e.g., pulmonary embolus, asthma, emphysema)     COPD 8. CAUSE: What do you think is causing the breathing problem?      unsure 9. OTHER SYMPTOMS: Do you have any other symptoms? (e.g., chest pain, cough, dizziness, fever, runny nose)     Pain in chest, hurts when he breathes  Protocols used: Breathing Difficulty-A-AH

## 2024-01-13 NOTE — Telephone Encounter (Addendum)
 Called and spoke with the patient. He stated he would like to come into office for CXR and would not like to go to UC or ER to be assessed. Pt stated they dont know what they are doing and would only like to see Dr. Neysa, I advised we had no openings here. Pt stated he has meds at home and is treating himself. I also advised pt again to go to UC or ER to be evaluated if he notices any new or worsening symptoms. Pt verbalized understanding. Placing CXR order.

## 2024-01-14 ENCOUNTER — Other Ambulatory Visit: Payer: Self-pay | Admitting: Acute Care

## 2024-01-14 ENCOUNTER — Ambulatory Visit

## 2024-01-14 DIAGNOSIS — R0602 Shortness of breath: Secondary | ICD-10-CM

## 2024-03-10 LAB — LAB REPORT - SCANNED
A1c: 5.4
EGFR: 70

## 2024-03-22 ENCOUNTER — Ambulatory Visit: Attending: Cardiology | Admitting: Cardiology

## 2024-03-22 ENCOUNTER — Encounter: Payer: Self-pay | Admitting: Cardiology

## 2024-03-22 VITALS — BP 168/88 | HR 66 | Ht 74.0 in | Wt 205.6 lb

## 2024-03-22 DIAGNOSIS — I10 Essential (primary) hypertension: Secondary | ICD-10-CM

## 2024-03-22 DIAGNOSIS — Z8673 Personal history of transient ischemic attack (TIA), and cerebral infarction without residual deficits: Secondary | ICD-10-CM

## 2024-03-22 DIAGNOSIS — I110 Hypertensive heart disease with heart failure: Secondary | ICD-10-CM | POA: Diagnosis not present

## 2024-03-22 DIAGNOSIS — E1169 Type 2 diabetes mellitus with other specified complication: Secondary | ICD-10-CM

## 2024-03-22 DIAGNOSIS — J449 Chronic obstructive pulmonary disease, unspecified: Secondary | ICD-10-CM

## 2024-03-22 DIAGNOSIS — I059 Rheumatic mitral valve disease, unspecified: Secondary | ICD-10-CM

## 2024-03-22 DIAGNOSIS — Z952 Presence of prosthetic heart valve: Secondary | ICD-10-CM

## 2024-03-22 DIAGNOSIS — E785 Hyperlipidemia, unspecified: Secondary | ICD-10-CM

## 2024-03-22 DIAGNOSIS — I4819 Other persistent atrial fibrillation: Secondary | ICD-10-CM

## 2024-03-22 DIAGNOSIS — I5032 Chronic diastolic (congestive) heart failure: Secondary | ICD-10-CM

## 2024-03-22 DIAGNOSIS — I358 Other nonrheumatic aortic valve disorders: Secondary | ICD-10-CM

## 2024-03-22 DIAGNOSIS — R600 Localized edema: Secondary | ICD-10-CM

## 2024-03-22 MED ORDER — ROSUVASTATIN CALCIUM 40 MG PO TABS: 40.0000 mg | ORAL_TABLET | Freq: Every day | ORAL | 3 refills | Status: DC

## 2024-03-22 MED ORDER — CARVEDILOL 12.5 MG PO TABS: 12.5000 mg | ORAL_TABLET | Freq: Two times a day (BID) | ORAL | 3 refills | Status: AC

## 2024-03-22 MED ORDER — IRBESARTAN 300 MG PO TABS: 300.0000 mg | ORAL_TABLET | Freq: Every day | ORAL | 3 refills | Status: AC

## 2024-03-22 MED ORDER — APIXABAN 5 MG PO TABS: 5.0000 mg | ORAL_TABLET | Freq: Two times a day (BID) | ORAL | 2 refills | Status: AC

## 2024-03-22 NOTE — Progress Notes (Signed)
 " Cardiology Office Note:  .   Date:  03/23/2024  ID:  [name], DOB [birth year], MRN [id number] PCP: Burney Darice CROME, MD  [facility] Providers Cardiologist:  Alm Clay, MD Structural Heart:  Ozell Fell, MD    Chief Complaint  Patient presents with   Follow-up    Delayed follow-up.  Has had hospitalizations and multiple other issues.   Cardiac Valve Problem    History of TAVR but also history of SBE in the fall 2024   Hypertension    Poorly controlled lipids   Atrial Fibrillation    No real symptoms of A-fib nothing prolonged.    Patient Profile: .     [name] is a 73 y.o. male with a PMH reviewed below who presents here for delayed 83-month but also hospital follow-up at the request of Burney Darice CROME, MD.  Cardiac History: Longstanding PAF-on Eliquis  and carvedilol  Rapid progression to severe AS now s/p TAVR (March 20 21-29 mm Edwards SAPIEN 3 THV), most recent gradients have been ranging from 14 to 16 mL of mercury. Also moderate MAC with MR and mild MS. Complicated by SBE in June 2025 treated with 6 weeks antibiotics Hypertensive heart disease with HFrEF and labile hypertension BLE edema HLD-needs rosuvastatin  refilled. History of CVA History of vaginal exposure during the [place] War-has had multiple issues with bowel obstructions requiring surgeries     I last saw [name] in August 2023.  He was most recently seen by Lum Ferdinand, NP on May 20, 2023 => due to hypertension he was reinitiated back on carvedilol  6.25 mg twice daily as his blood pressures were in the 170s to 180s.  Follow-up echo ordered.  Discussed his hospitalization for SBE's strep infant carious but actually vegetation was on the mitral valve not the aortic valve.  Follow-up echo did not show any residual vegetation.  He denied any active symptoms of A-fib that was prolonged.  He was noted to be somewhat tachycardic at baseline.  He noted persistent BLE edema  that he says happened since TAVR although this is not likely the case because he had edema prior to it.  Lasix  spironolactone  were both discontinued during his previous hospitalization.  They were not restarted on discharge from the clinic as he was doing fine with no major edema and recommended support stockings.   Subjective  Discussed the use of AI scribe software for clinical note transcription with the patient, who gave verbal consent to proceed.  History of Present Illness [name] is a 73 year old male with a history of TAVR and endocarditis who presents for follow-up of his cardiac condition.  He underwent a TAVR procedure in March 2021. In July 2024, an echocardiogram revealed a mobile echo density on the mitral valve, raising concerns about endocarditis. A TEE showed moderate thickening and stenosis of the aortic valve, with a mobile filamentous mass. He was treated with antibiotics for strep bovis endocarditis, initially hospitalized for four days, and then continued treatment at home with a PICC line until January 21, 2023.  In September 2024, he experienced confusion and altered mental status, prompting another echocardiogram which showed a normal ejection fraction of 55-60%, mild to moderate mitral regurgitation, and no evidence of endocarditis. His aortic valve had a mean gradient of 14.5.  No recent fevers, chills, or infections. He sleeps with his head elevated at 35 degrees for breathing comfort and denies waking up short of breath. He experiences mild leg  swelling and occasional heart racing, but no current symptoms of atrial fibrillation. His blood pressure is typically high, around 180 mmHg at home, but he checks it twice a week.  He has a history of strokes, with the most recent occurring about a year and a half ago, affecting his right side with weakness in the right arm and leg. No current stroke symptoms, headaches, or blurred vision. He also reports neuropathy in his  legs.  His current medications include Eliquis  5 mg twice a day, carvedilol  6.25 mg twice a day, irbesartan , and Crestor  40 mg. He is not taking losartan . He has a history of depression post-COVID, for which he was treated with sertraline .  He follows a diet primarily consisting of chicken and fish and has lost weight.   Cardiovascular ROS: positive for - chest pain, dyspnea on exertion, edema, palpitations, shortness of breath, and seems to be recovering from his recent hospitalizations relatively well. negative for - orthopnea, paroxysmal nocturnal dyspnea, rapid heart rate, shortness of breath, or syncope or near syncope, TIA or amaurosis fugax.  ROS:  Review of Systems - Negative except symptoms noted above.    Objective   Current Meds  Medication Sig   albuterol  (PROVENTIL ) (2.5 MG/3ML) 0.083% nebulizer solution Take 2.5 mg by nebulization every 6 (six) hours as needed for wheezing or shortness of breath.   albuterol  (VENTOLIN  HFA) 108 (90 Base) MCG/ACT inhaler Inhale 1-2 puffs into the lungs every 4 (four) hours as needed for wheezing or shortness of breath.   Cholecalciferol  (VITAMIN D -3) 125 MCG (5000 UT) TABS Take 5,000 Units by mouth daily.   fluticasone -salmeterol (ADVAIR) 250-50 MCG/ACT AEPB Inhale 1 puff into the lungs in the morning and at bedtime. Rinse mouth well with water  after each use   folic acid  (FOLVITE ) 1 MG tablet Take 1 tablet (1 mg total) by mouth daily.   Multiple Vitamin (MULTIVITAMIN WITH MINERALS) TABS tablet Take 1 tablet by mouth at bedtime.   [Refilled] apixaban  (ELIQUIS ) 5 MG TABS tablet Take 1 tablet (5 mg total) by mouth 2 (two) times daily.   [Changed today] carvedilol  (COREG ) 6.25 MG tablet Take 1 tablet (6.25 mg total) by mouth 2 (two) times daily. => Increased to 12.5 mg daily tabs during this visit   [Renewed] irbesartan  (AVAPRO ) 300 MG tablet Take 300 mg by mouth daily.     Studies Reviewed: SABRA   EKG Interpretation Date/Time:  Wednesday  March 22 2024 08:38:47 EDT Ventricular Rate:  66 PR Interval:  166 QRS Duration:  92 QT Interval:  430 QTC Calculation: 450 R Axis:   -16  Text Interpretation: Normal sinus rhythm Normal ECG When compared with ECG of 13-Feb-2023 13:55, Rate slower Confirmed by Anner Lenis ([id number]) on 03/22/2024 9:13:25 AM    Lab Results  Component Value Date   NA 130 (L) 02/19/2023   K 4.8 02/19/2023   CREATININE 1.09 02/19/2023   EGFR 70.0 03/10/2024   GLUCOSE 98 02/19/2023   Component Ref Range & Units (hover) 7 mo ago (08/12/23)  EGFR 91.0  TSH 2.12  A1c 5.6 VC   Labs from PCPs office 03/09/2024: BUN 21, Cr 1.11, Ca 9.9, TSH 3.29; TC 213, TG 118, HDL 52;  => unfortunately, LDL 137 is too high-and we need to readdress..;    Results DIAGNOSTIC Echocardiogram (12/12/2022): Normal ejection fraction 60 to 65%.  GR 1 DD.  No RWMA.  Severe MAC with mobile pedunculated echodensity (0.2 x 0.3 cm)-consider TEE; 29 mm Edwards SAPIEN prosthetic TAVR  valve in place.  Mean gradient 16 Transesophageal Echocardiogram (12/14/2022): EF 55 to 60%.  Mild LVH.  Normal RV.  No LAA thrombus.  Mild MR.  Grade 2 aortic plaque. mobile filamentous mass on mitral valve in the LVOT.  TAVR valve in place.  Mean AVG 15.5 mmHg. Treated with 6 weeks of antibiotics. Echocardiogram (02/14/2023): Ejection fraction 55-60%, no wall motion abnormalities, degenerative mitral valve with mild to moderate MR and mild MS (mean MV G4 0.7 mmHg;.  Moderate MAC = No residual endocarditis, prosthetic aortic valve mean gradient 14.5.   CATH (07/13/2019): Pre-TAVR right and left heart cath: Angiographic normal coronaries with moderate to severe AAS by.  Relatively normal RHC PRESSURES with mildly elevated LVEDP. Dominance: Right    Risk Assessment/Calculations:    CHA2DS2-VASc Score = 6   This indicates a 9.7% annual risk of stroke. The patient's score is based upon: CHF History: 1 HTN History: 1 Diabetes History: 0 Stroke History:  2 Vascular Disease History: 1 Age Score: 1 Gender Score: 0    HYPERTENSION CONTROL Vitals:   03/22/24 0853 03/23/24 2111  BP: (!) 184/92 (!) 168/88    The patient's blood pressure is elevated above target today.  In order to address the patient's elevated BP: A current anti-hypertensive medication was adjusted today. (Increase carvedilol  to 2 tabs twice daily with plans to potentially readd amlodipine  +/- thiazide diuretic versus spironolactone )        Physical Exam:   VS:  BP (!) 168/88   Pulse 66   Ht 6' 2 (1.88 m)   Wt 205 lb 9.6 oz (93.3 kg)   SpO2 99%   BMI 26.40 kg/m    Wt Readings from Last 3 Encounters:  03/22/24 205 lb 9.6 oz (93.3 kg)  05/20/23 187 lb (84.8 kg)  02/19/23 181 lb (82.1 kg)    Physical Exam MUSCULOSKELETAL: Murmur present. S4 gallop present. PULMONARY: Lungs are wheezy.   GEN: Chronically ill but well-groomed and well-nourished.  Walks with a cane.  In no acute distress. NECK: No JVD; No carotid bruits CARDIAC: Normal S1, S2-With S4 gallop.; RRR, 2/6 SEM at RUSB-neck.  , rubs, gallops RESPIRATORY: On initial assessment, she was having some wheezing with mild dyspnea however on reassessment clear to auscultation without rales, wheezing or rhonchi ; nonlabored, good air movement. ABDOMEN: Soft, non-tender, non-distended EXTREMITIES: Trivial ankle edema.  Right arm is notably weaker with less dexterity and less mobility.  There is also some mild forearm swelling of the right arm.  Similarly mild right leg swelling.  (Residual from prior stroke)    ASSESSMENT AND PLAN: .    Problem List Items Addressed This Visit       Cardiology Problems   Essential hypertension (Chronic)   Very difficult for me to manage his blood pressure since I do not see him that often and he has had hospitalizations for multiple medications have not changed.  He is now on carvedilol  6.25 mg twice daily and irbesartan  300 mg daily.  He is no longer on losartan  having been  switched.  He is no longer on spironolactone  and furosemide . => Continue irbesartan  300 mg daily, increase carvedilol  to 12.5 mg twice daily and referred to hypertension clinic for further titration.  Would probably get him back on spironolactone  plus or minus loop or thiazide diuretic.  Peripherally would not hold off on amlodipine  because of issues with swelling in the past.      Relevant Medications   irbesartan  (AVAPRO ) 300 MG tablet  carvedilol  (COREG ) 12.5 MG tablet   apixaban  (ELIQUIS ) 5 MG TABS tablet   rosuvastatin  (CRESTOR ) 40 MG tablet   Other Relevant Orders   EKG 12-Lead (Completed)   AMB Referral to [facility] Pharm-D   History of mitral valve endocarditis (Chronic)   Mitral valve regurgitation and stenosis Mild to moderate regurgitation and mild stenosis. No surgical intervention planned.  Follow-up with surveillance echoes from TAVR.  Infective endocarditis, status post treatment No current signs of infection or endocarditis post-treatment. => No residual vegetation noted on follow-up echocardiogram.  Continue to monitor mitral valve with surveillance echoes for TAVR.      Relevant Medications   irbesartan  (AVAPRO ) 300 MG tablet   carvedilol  (COREG ) 12.5 MG tablet   apixaban  (ELIQUIS ) 5 MG TABS tablet   rosuvastatin  (CRESTOR ) 40 MG tablet   Hyperlipidemia associated with type 2 diabetes mellitus (HCC) (Chronic)   We will refill his Crestor  40 mg daily.  Most recent lipids showed that the LDL is well out of control. I do not know how long he been off of Crestor , but may very well need more aggressive therapy.  Will ask that in addition to monitoring his hypertension, our clinical pharmacy team from [facility] will also assist with lipid management.  Diabetes managed by PCP.       Relevant Medications   irbesartan  (AVAPRO ) 300 MG tablet   carvedilol  (COREG ) 12.5 MG tablet   apixaban  (ELIQUIS ) 5 MG TABS tablet   rosuvastatin  (CRESTOR ) 40 MG tablet   Hypertensive heart  disease with chronic diastolic congestive heart failure (HCC) - Primary (Chronic)   Blood pressure remains elevated at 180 mmHg despite current medications. Awaiting lab results to evaluate kidney function before considering diuretics. - Double carvedilol  dose to 5 mg twice daily. - Refer to hypertension clinic. => He was previously on spironolactone  25 mg daily along with furosemide -both of which have obviously been stopped since previous visits. - Obtain recent blood work results.      Relevant Medications   irbesartan  (AVAPRO ) 300 MG tablet   carvedilol  (COREG ) 12.5 MG tablet   apixaban  (ELIQUIS ) 5 MG TABS tablet   rosuvastatin  (CRESTOR ) 40 MG tablet   Other Relevant Orders   EKG 12-Lead (Completed)   AMB Referral to [facility] Pharm-D   Persistent atrial fibrillation (HCC) (Chronic)   As far as he can tell he has not had any recent spells that have lasted very long. Seems to be doing relatively well from the standpoint,  - Continue carvedilol  but increase to 1200 mg twice daily for rate control.  - Continue Eliquis  5 mg twice daily       Relevant Medications   irbesartan  (AVAPRO ) 300 MG tablet   carvedilol  (COREG ) 12.5 MG tablet   apixaban  (ELIQUIS ) 5 MG TABS tablet   rosuvastatin  (CRESTOR ) 40 MG tablet   Other Relevant Orders   ECHOCARDIOGRAM COMPLETE     Other   Bilateral lower extremity edema (Chronic)   He has had chronic edema but seems to be better controlled now.  Continue to monitor.  Low threshold to reinitiate diuretics.  He had been on Lasix  and spironolactone .  Also using his support stockings.      COPD mixed type (HCC) (Chronic)   History of stroke (Chronic)   Stroke with residual right-sided weakness Persistent right-sided weakness post-stroke. No new symptoms.      S/P TAVR (transcatheter aortic valve replacement) (Chronic)   Aortic valve function stable post-TAVR with mean gradient of 14.5 mmHg. Euvolemic on exam.  NYHA class I-II symptoms mostly  limited by lung disease and deconditioning. - Schedule follow-up echocardiogram for 1 year out from his most recent echo..  Discussed SBE prophylaxis especially in light of his recent SBE      Relevant Orders   EKG 12-Lead (Completed)   ECHOCARDIOGRAM COMPLETE   AMB Referral to [facility] Pharm-D      Follow-Up: Return in about 6 months (around 09/20/2024) for BP follow-up with [facility], Cholesterol F/u with [facility], Alternate 6 month follow-up with APP & MD. Discussed ongoing management of cardiac and hypertensive conditions. - Schedule follow-up echocardiogram. - Refer to hypertension clinic. - Obtain and review recent blood work results.  I spent 62 minutes in the care of [name] today including reviewing outside labs from PCP via scanned labs (2 minutes), reviewing studies (last 2 echoes and TEE reviewed-6 minutes), face to face time discussing treatment options (24 minutes), reviewing records from multiple clinic notes, hospitalizations etc. from 2023 till now (13 minutes), 17 minutes dictating, and documenting in the encounter.      Signed, Alm MICAEL Clay, MD, MS Alm Clay, M.D., M.S. Interventional Cardiologist  [facility] Pager # [phone]      "

## 2024-03-22 NOTE — Patient Instructions (Addendum)
 Medication Instructions:    Increase Carvedilol  12.5 mg twice a day    *If you need a refill on your cardiac medications before your next appointment, please call your pharmacy*   Lab Work: Not needed    Testing/Procedures: Your physician has requested that you have an echocardiogram. Echocardiography is a painless test that uses sound waves to create images of your heart. It provides your doctor with information about the size and shape of your heart and how well your heart's chambers and valves are working. This procedure takes approximately one hour. There are no restrictions for this procedure. Please do NOT wear cologne, perfume, aftershave, or lotions (deodorant is allowed). Please arrive 15 minutes prior to your appointment time.  Please note: We ask at that you not bring children with you during ultrasound (echo/ vascular) testing. Due to room size and safety concerns, children are not allowed in the ultrasound rooms during exams. Our front office staff cannot provide observation of children in our lobby area while testing is being conducted. An adult accompanying a patient to their appointment will only be allowed in the ultrasound room at the discretion of the ultrasound technician under special circumstances. We apologize for any inconvenience.    Follow-Up: At [facility], you and your health needs are our priority.  As part of our continuing mission to provide you with exceptional heart care, we have created designated Provider Care Teams.  These Care Teams include your primary Cardiologist (physician) and Advanced Practice Providers (APPs -  Physician Assistants and Nurse Practitioners) who all work together to provide you with the care you need, when you need it.     Your next appointment:   6 month(s)  The format for your next appointment:   In Person  Provider:   Lum Louis, NP      Then, Alm Clay, MD will plan to see you again in 12  month(s).   Other Instructions

## 2024-03-23 ENCOUNTER — Encounter: Payer: Self-pay | Admitting: Cardiology

## 2024-03-23 NOTE — Assessment & Plan Note (Signed)
 He has had chronic edema but seems to be better controlled now.  Continue to monitor.  Low threshold to reinitiate diuretics.  He had been on Lasix  and spironolactone .  Also using his support stockings.

## 2024-03-23 NOTE — Assessment & Plan Note (Signed)
 Stroke with residual right-sided weakness Persistent right-sided weakness post-stroke. No new symptoms.

## 2024-03-23 NOTE — Assessment & Plan Note (Signed)
 Mitral valve regurgitation and stenosis Mild to moderate regurgitation and mild stenosis. No surgical intervention planned.  Follow-up with surveillance echoes from TAVR.  Infective endocarditis, status post treatment No current signs of infection or endocarditis post-treatment. => No residual vegetation noted on follow-up echocardiogram.  Continue to monitor mitral valve with surveillance echoes for TAVR.

## 2024-03-23 NOTE — Assessment & Plan Note (Addendum)
 Aortic valve function stable post-TAVR with mean gradient of 14.5 mmHg. Euvolemic on exam.  NYHA class I-II symptoms mostly limited by lung disease and deconditioning. - Schedule follow-up echocardiogram for 1 year out from his most recent echo..  Discussed SBE prophylaxis especially in light of his recent SBE

## 2024-03-23 NOTE — Assessment & Plan Note (Addendum)
 We will refill his Crestor  40 mg daily.  Most recent lipids showed that the LDL is well out of control. I do not know how long he been off of Crestor , but may very well need more aggressive therapy.  Will ask that in addition to monitoring his hypertension, our clinical pharmacy team from [facility] will also assist with lipid management.  Diabetes managed by PCP.

## 2024-03-23 NOTE — Assessment & Plan Note (Signed)
 Blood pressure remains elevated at 180 mmHg despite current medications. Awaiting lab results to evaluate kidney function before considering diuretics. - Double carvedilol  dose to 5 mg twice daily. - Refer to hypertension clinic. => He was previously on spironolactone  25 mg daily along with furosemide -both of which have obviously been stopped since previous visits. - Obtain recent blood work results.

## 2024-03-23 NOTE — Assessment & Plan Note (Addendum)
 Very difficult for me to manage his blood pressure since I do not see him that often and he has had hospitalizations for multiple medications have not changed.  He is now on carvedilol  6.25 mg twice daily and irbesartan  300 mg daily.  He is no longer on losartan  having been switched.  He is no longer on spironolactone  and furosemide . => Continue irbesartan  300 mg daily, increase carvedilol  to 12.5 mg twice daily and referred to hypertension clinic for further titration.  Would probably get him back on spironolactone  plus or minus loop or thiazide diuretic.  Peripherally would not hold off on amlodipine  because of issues with swelling in the past.

## 2024-03-23 NOTE — Assessment & Plan Note (Signed)
 As far as he can tell he has not had any recent spells that have lasted very long. Seems to be doing relatively well from the standpoint,  - Continue carvedilol  but increase to 1200 mg twice daily for rate control.  - Continue Eliquis  5 mg twice daily

## 2024-03-27 ENCOUNTER — Ambulatory Visit: Admitting: Cardiology

## 2024-03-28 ENCOUNTER — Ambulatory Visit: Payer: Self-pay | Admitting: Internal Medicine

## 2024-03-28 NOTE — Telephone Encounter (Signed)
 E2C2 Pulmonary Triage - Initial Assessment Questions "Chief Complaint (e.g., cough, sob, wheezing, fever, chills, sweat or additional symptoms) *Go to specific symptom protocol after initial questions. Patient reports persistent coughing and congestion for the last four months. Symptoms have worsened over the last 2 weeks. Patient reports coughing worsens with ambulation. Cough is productive-sputum is clear. Endorses wheezing also. Patient states he is having more congestion at night which causes increases coughing while sleeping. Patient recommended to be seen in the office. Scheduled patient for acute visit tomorrow at [facility] at 1 PM  "How long have symptoms been present?" 4 months ago  Have you tested for COVID or Flu? Note: If not, ask patient if a home test can be taken. If so, instruct patient to call back for positive results. No  MEDICINES:   "Have you used any OTC meds to help with symptoms?" Yes If yes, ask "What medications?" Allegra  "Have you used your inhalers/maintenance medication?" Yes If yes, "What medications?" Albuterol  inhaler/nebulizer Advair   If inhaler, ask "How many puffs and how often?" Note: Review instructions on medication in the chart. Albuterol  1-2 puffs Q4H PRN Advair 1 puff BID  OXYGEN : "Do you wear supplemental oxygen ?" Yes If yes, "How many liters are you supposed to use?" 2L-PRN  "Do you monitor your oxygen  levels?" Yes If yes, What is your reading (oxygen  level) today? 98%  What is your usual oxygen  saturation reading?  (Note: Pulmonary O2 sats should be 90% or greater)  Mid 90s   Copied from CRM #[id number]. Topic: Clinical - Red Word Triage >> Mar 28, 2024 11:06 AM Rilla NOVAK wrote: [facility] that prompted transfer to Nurse Triage: Persistent cough, diff breathing, congestion Reason for Disposition  SEVERE coughing spells (e.g., whooping sound after coughing, vomiting after coughing)  Answer Assessment - Initial Assessment Questions 1.  ONSET: When did the cough begin?      Started 4 months 7. CARDIAC HISTORY: Do you have any history of heart disease? (e.g., heart attack, congestive heart failure)      yes 8. LUNG HISTORY: Do you have any history of lung disease?  (e.g., pulmonary embolus, asthma, emphysema)     yes 9. PE RISK FACTORS: Do you have a history of blood clots? (or: recent major surgery, recent prolonged travel, bedridden)     no 12. TRAVEL: Have you traveled out of the country in the last month? (e.g., travel history, exposures)       no  Protocols used: Cough - Chronic-A-AH

## 2024-03-29 ENCOUNTER — Ambulatory Visit (HOSPITAL_BASED_OUTPATIENT_CLINIC_OR_DEPARTMENT_OTHER)
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
## 2024-04-03 ENCOUNTER — Ambulatory Visit (HOSPITAL_BASED_OUTPATIENT_CLINIC_OR_DEPARTMENT_OTHER)

## 2024-04-05 ENCOUNTER — Encounter (HOSPITAL_BASED_OUTPATIENT_CLINIC_OR_DEPARTMENT_OTHER): Payer: Self-pay

## 2024-04-05 NOTE — Telephone Encounter (Signed)
 Recommendations on new provider

## 2024-04-08 NOTE — Progress Notes (Deleted)
 HPI M Veteran, Never smoker followed for Bronchiectasis/recurrent pneumonia/Enterobacter due to reflux, complicated by DM, HBP, lung nodule, GERD, ischemic colitis/ multiple surgeries, valvular heart disease Office Spirometry 01/16/16-moderate restriction of exhaled volume, at least mild obstructive airways disease. FVC 3.02/59%, FEV1 2.13/56%, ratio 0.95, FEF 25-75% 1.28/42%  --------------------------------------------------------   09/25/21- 73 year old male Veteran, never smoker, followed for Bronchiectasis/ recurrent pneumonia/Enterobacter/recurrent aspiration, complicated by DM, HTN, lung nodule, GERD, ischemic colitis/multiple surgeries, valvular heart disease/ TAVR, ASCVD, AFib, Depression,  --Albuterol  neb, albuterol  hfa, Symbicort  160,  Flutter, Covid- 3 Moderna Flu vax- We sent Zpak and prednisone  in Feb. -----Acute. Patient having shortness of breath.    Arrival BP 170/104, HR 70, O2 sat 99% RA He had fallen and bathroom injuring shoulder and pending surgery when able.  Has oral morphine  from orthopedist. Self-Pay Home oxygen  at 2 L with portable.  He watches his oximeter and uses O2 as needed. He is a little stiff and still sore it shows me bruising on left lateral rib cage. CXR 09/25/21-  IMPRESSION: No radiographic evidence of thoracic trauma.   04/11/24- 74 year old male Veteran, never smoker, followed for Bronchiectasis/ recurrent pneumonia/Enterobacter/recurrent aspiration, complicated by DM, HTN, lung nodule, GERD, ischemic colitis/multiple surgeries, valvular heart disease/ TAVR, ASCVD, AFib, Depression,  --Albuterol  neb, albuterol  hfa, Symbicort  160,  Flutter,      ROS-   += positive Constitutional:   No-   weight loss, night sweats, +fevers, chills, fatigue, lassitude. HEENT:   No-  headaches, difficulty swallowing, +tooth/dental problems, sore throat,        sneezing, itching, ear ache, nasal congestion, post nasal drip,  CV:      chest pain, +orthopnea, PND,  swelling in lower extremities, anasarca, dizziness, +palpitations Resp: + shortness of breath with exertion or at rest.                +productive cough,  No non-productive cough,              +change in color of mucus.  No- wheezing.   Skin: No-   rash or lesions. GI:  No-   heartburn, indigestion, +abdominal pain, nausea, vomiting,  GU:  MS:  +  joint pain or swelling.  Neuro-     nothing unusual Psych:  No- change in mood or affect. + depression or anxiety.  No memory loss  OBJ- Physical Exam    General- Alert, Oriented, + anxious/depressed, Distress- none acute, + overweight Skin- + fungal nail changes right hand and also reported on toes of both feet Lymphadenopathy- none Head- atraumatic            Eyes- Gross vision intact, PERRLA, conjunctivae and secretions clear            Ears- Hearing, canals-normal            Nose-  no-Septal dev,polyps, erosion, perforation             Throat- Mallampati II , mucosa clear , drainage- none, tonsils- atrophic. + Missing teeth,  Neck- flexible , trachea midline, no stridor , thyroid  nl, carotid no bruit Chest - symmetrical excursion , unlabored           Heart/CV- RRR ,  murmur + 2/6 S aortic , no gallop  , no rub, nl s1 s2                           - JVD- none , edema+1/ support hose, stasis changes- none, varices-  none           Lung-+clear unlabored, cough-none, +talkative on room air                  dullness-none, rub- none           Chest wall- + bruising left rib cage Abd- + multiple surgical scars Br/ Gen/ Rectal- Not done, not indicated Extrem-  +cane,  Neuro- +mild word-searching

## 2024-04-11 ENCOUNTER — Ambulatory Visit: Admitting: Internal Medicine

## 2024-04-12 ENCOUNTER — Ambulatory Visit (HOSPITAL_BASED_OUTPATIENT_CLINIC_OR_DEPARTMENT_OTHER)

## 2024-04-13 LAB — COLOGUARD

## 2024-04-19 ENCOUNTER — Ambulatory Visit: Payer: Self-pay | Admitting: Internal Medicine

## 2024-04-19 NOTE — Progress Notes (Signed)
 HPI M Veteran, Never smoker followed for Bronchiectasis/recurrent pneumonia/Enterobacter due to reflux, complicated by DM, HBP, lung nodule, GERD, ischemic colitis/ multiple surgeries, valvular heart disease Office Spirometry 01/16/16-moderate restriction of exhaled volume, at least mild obstructive airways disease. FVC 3.02/59%, FEV1 2.13/56%, ratio 0.95, FEF 25-75% 1.28/42%  --------------------------------------------------------   09/25/21- 73 year old male Veteran, never smoker, followed for Bronchiectasis/ recurrent pneumonia/Enterobacter/recurrent aspiration, complicated by DM, HTN, lung nodule, GERD, ischemic colitis/multiple surgeries, valvular heart disease/ TAVR, ASCVD, AFib, Depression,  --Albuterol  neb, albuterol  hfa, Symbicort  160,  Flutter, Covid- 3 Moderna Flu vax- We sent Zpak and prednisone  in Feb. -----Acute. Patient having shortness of breath.    Arrival BP 170/104, HR 70, O2 sat 99% RA He had fallen and bathroom injuring shoulder and pending surgery when able.  Has oral morphine  from orthopedist. Self-Pay Home oxygen  at 2 L with portable.  He watches his oximeter and uses O2 as needed. He is a little stiff and still sore it shows me bruising on left lateral rib cage. CXR 09/25/21-  IMPRESSION: No radiographic evidence of thoracic trauma.  04/20/24- 73 year old male Veteran, never smoker, followed for Bronchiectasis/ recurrent pneumonia/Enterobacter/recurrent aspiration, complicated by DM, HTN, lung nodule, GERD, ischemic colitis/multiple surgeries, valvular heart disease/ TAVR, ASCVD, AFib, Depression,  --Albuterol  neb, albuterol  hfa, Advair250,  Flutter, Some mucus in throat Reports chronic cough, stable, with clear sputum, no chest pain, blood or change in activity tolerance.  Asks pulmonary clearance/ risk assessment for shoulder surgery. Discussed higher than average risk due to his multiple medical problems, but no acute concerns. He is at baseline for him. We will let  him try Breztri  inhaler samples.  CT chest abdpelvis 02/18/23 IMPRESSION: 1. No acute findings within the chest, abdomen or pelvis. No source of sepsis. 2. Marked increased colonic stool consistent with constipation. No bowel obstruction or inflammation. 3. Multiple stable chronic findings detailed above.  ROS-   += positive Constitutional:   No-   weight loss, night sweats, +fevers, chills, fatigue, lassitude. HEENT:   No-  headaches, difficulty swallowing, +tooth/dental problems, sore throat,        sneezing, itching, ear ache, nasal congestion, post nasal drip,  CV:      chest pain, +orthopnea, PND, swelling in lower extremities, anasarca, dizziness, +palpitations Resp: + shortness of breath with exertion or at rest.                +productive cough,  No non-productive cough,              change in color of mucus.  No- wheezing.   Skin: No-   rash or lesions. GI:  No-   heartburn, indigestion, +abdominal pain, nausea, vomiting,  GU:  MS:  +  joint pain or swelling.  Neuro-     nothing unusual Psych:  No- change in mood or affect. + depression or anxiety.  No memory loss  OBJ- Physical Exam    General- Alert, Oriented, + calm,  Distress- none acute,  Skin- + fungal nail changes right hand and also reported on toes of both feet Lymphadenopathy- none Head- atraumatic            Eyes- Gross vision intact, PERRLA, conjunctivae and secretions clear            Ears- Hearing, canals-normal            Nose-  no-Septal dev,polyps, erosion, perforation             Throat- Mallampati II , mucosa clear , drainage-  none, tonsils- atrophic. + Missing teeth,  Neck- flexible , trachea midline, no stridor , thyroid  nl, carotid no bruit Chest - symmetrical excursion , unlabored           Heart/CV- RRR ,  murmur + 2/6 S aortic , no gallop  , no rub, nl s1 s2                           - JVD- none , edema+1/ support hose, stasis changes- none, varices- none           Lung-+coarse breath sounds/ few  scattered rhonchi, unlabored, cough-none, +talkative on room air                  dullness-none, rub- none           Chest wall-  Abd- + multiple surgical scars Br/ Gen/ Rectal- Not done, not indicated Extrem-  +cane,  Neuro- unremarkable

## 2024-04-19 NOTE — Telephone Encounter (Signed)
 FYI Only or Action Required?: FYI only for provider: appointment scheduled on 04/20/24.  Patient is followed in Pulmonology for COPD, last seen on 09/25/2021 by Neysa Reggy BIRCH, MD.  Called Nurse Triage reporting Wheezing. Comes and goes, cough productive yellow phlegm.  Symptoms began several days ago.  Interventions attempted: Prescription medications: advair, Rescue inhaler, and Nebulizer treatments.  Symptoms are: gradually worsening.  Triage Disposition: See HCP Within 4 Hours (Or PCP Triage) 4-24 hours  Patient/caregiver understands and will follow disposition?: Yes    E2C2 Pulmonary Triage - Initial Assessment Questions "Chief Complaint (e.g., cough, sob, wheezing, fever, chills, sweat or additional symptoms) *Go to specific symptom protocol after initial questions. Cough, wheezing at times, productive yellow phlegm  "How long have symptoms been present?" Friday   Have you tested for COVID or Flu? Note: If not, ask patient if a home test can be taken. If so, instruct patient to call back for positive results. No  MEDICINES:   "Have you used any OTC meds to help with symptoms?" No If yes, ask "What medications?" na  "Have you used your inhalers/maintenance medication?" Yes If yes, "What medications?" Ventolin  inhaler, advair, nebulizer   If inhaler, ask "How many puffs and how often?" Note: Review instructions on medication in the chart. As directed  OXYGEN : "Do you wear supplemental oxygen ?" No If yes, "How many liters are you supposed to use?" Wife reports she applied her oxygen  at  2 L/min at night last night due to wheezing.  "Do you monitor your oxygen  levels?" Yes If yes, What is your reading (oxygen  level) today? 98% this am RA but could not find monitor while talking with NT  What is your usual oxygen  saturation reading?  (Note: Pulmonary O2 sats should be 90% or greater) 98%    Recommended to wife if sx worsen go to ED or call  [phone].        Copied from CRM #[id number]. Topic: Clinical - Red Word Triage >> Apr 19, 2024  3:29 PM Russell PARAS wrote: Red Word that prompted transfer to Nurse Triage:   Has rattling, wheezing and severe cough Phlegm is clear Had COVID exposure this past weekend  Pt of Dr Neysa Reason for Disposition  [1] Longstanding difficulty breathing (e.g., CHF, COPD, emphysema) AND [2] WORSE than normal  Answer Assessment - Initial Assessment Questions Appt scheduled for tomorrow due to worsening sx of cough and yellow phlegm. Patient wife requesting antibiotics. Wheezing  reported last night and patient wife reports wheezing now gone. Patient denies chest pain no SOB no fever.       1. RESPIRATORY STATUS: Describe your breathing? (e.g., wheezing, shortness of breath, unable to speak, severe coughing)      Wheezing at times,  coughing up clear phlegm 2. ONSET: When did this breathing problem begin?      Friday  3. PATTERN Does the difficult breathing come and go, or has it been constant since it started?      Comes and goes 4. SEVERITY: How bad is your breathing? (e.g., mild, moderate, severe)      Denies breathing issues now. More wheezing episodes noted after being in dusty garage on Friday  5. RECURRENT SYMPTOM: Have you had difficulty breathing before? If Yes, ask: When was the last time? and What happened that time?      Yes  6. CARDIAC HISTORY: Do you have any history of heart disease? (e.g., heart attack, angina, bypass surgery, angioplasty)      See hx  7. LUNG HISTORY: Do you have any history of lung disease?  (e.g., pulmonary embolus, asthma, emphysema)     Hx COPD 8. CAUSE: What do you think is causing the breathing problem?      In dusty garage 9. OTHER SYMPTOMS: Do you have any other symptoms? (e.g., chest pain, cough, dizziness, fever, runny nose)     Cough yellow phlegm gotten better. Wheezing at times and last night . No fever 10. O2 SATURATION  MONITOR:  Do you use an oxygen  saturation monitor (pulse oximeter) at home? If Yes, ask: What is your reading (oxygen  level) today? What is your usual oxygen  saturation reading? (e.g., 95%)       Can not find monitor now, reports 98% this am  11. PREGNANCY: Is there any chance you are pregnant? When was your last menstrual period?       na 12. TRAVEL: Have you traveled out of the country in the last month? (e.g., travel history, exposures)       na  Protocols used: Breathing Difficulty-A-AH

## 2024-04-20 ENCOUNTER — Encounter: Payer: Self-pay | Admitting: Internal Medicine

## 2024-04-20 ENCOUNTER — Ambulatory Visit: Admitting: Internal Medicine

## 2024-04-20 VITALS — BP 136/80 | HR 77 | Temp 98.0°F | Ht 74.0 in | Wt 211.8 lb

## 2024-04-20 DIAGNOSIS — J449 Chronic obstructive pulmonary disease, unspecified: Secondary | ICD-10-CM | POA: Diagnosis not present

## 2024-04-20 DIAGNOSIS — K219 Gastro-esophageal reflux disease without esophagitis: Secondary | ICD-10-CM | POA: Diagnosis not present

## 2024-04-20 MED ORDER — BREZTRI AEROSPHERE 160-9-4.8 MCG/ACT IN AERO: 2.0000 | INHALATION_SPRAY | Freq: Two times a day (BID) | RESPIRATORY_TRACT | Status: AC

## 2024-04-20 NOTE — Patient Instructions (Addendum)
 Suggested Mucinex otc as a mucus thinner. If it helps, you can use it when needed and some people take it long-term.  Order- sample x 2 Breztri inhaler    inhale 2 puffs, then rinse mouth, twice daily Try this instead of Advair for now. If it works better, we can send script to [place].  You are clear from a pulmonary standpoint for shoulder replacement surgery

## 2024-04-22 NOTE — Assessment & Plan Note (Signed)
Reminder reflux precautions 

## 2024-04-22 NOTE — Assessment & Plan Note (Signed)
 Chronic bronchitis/ bronchiectasis without exacerbation Stable from pulmonary standpoint, at higher than average risk for planned shoulder surgery, recognizing multiple medical problems. Pulmonary consultation will be available in hospital if needed Plan- samples Breztri  inhaler

## 2024-04-29 NOTE — Progress Notes (Unsigned)
 HPI M Veteran, Never smoker followed for Bronchiectasis/recurrent pneumonia/Enterobacter due to reflux, complicated by DM, HBP, lung nodule, GERD, ischemic colitis/ multiple surgeries, valvular heart disease Office Spirometry 01/16/16-moderate restriction of exhaled volume, at least mild obstructive airways disease. FVC 3.02/59%, FEV1 2.13/56%, ratio 0.95, FEF 25-75% 1.28/42% ==============================================================================================================   09/25/21- 73 year old male Veteran, never smoker, followed for Bronchiectasis/ recurrent pneumonia/Enterobacter/recurrent aspiration, complicated by DM, HTN, lung nodule, GERD, ischemic colitis/multiple surgeries, valvular heart disease/ TAVR, ASCVD, AFib, Depression,  --Albuterol  neb, albuterol  hfa, Symbicort  160,  Flutter, Covid- 3 Moderna Flu vax- We sent Zpak and prednisone  in Feb. -----Acute. Patient having shortness of breath.    Arrival BP 170/104, HR 70, O2 sat 99% RA He had fallen and bathroom injuring shoulder and pending surgery when able.  Has oral morphine  from orthopedist. Self-Pay Home oxygen  at 2 L with portable.  He watches his oximeter and uses O2 as needed. He is a little stiff and still sore it shows me bruising on left lateral rib cage. CXR 09/25/21-  IMPRESSION: No radiographic evidence of thoracic trauma.  04/20/24- 73 year old male Veteran, never smoker, followed for Bronchiectasis/ recurrent pneumonia/Enterobacter/recurrent aspiration, complicated by DM, HTN, lung nodule, GERD, ischemic colitis/multiple surgeries, valvular heart disease/ TAVR, ASCVD, AFib, Depression,  --Albuterol  neb, albuterol  hfa, Advair250,  Flutter, Some mucus in throat Reports chronic cough, stable, with clear sputum, no chest pain, blood or change in activity tolerance.  Asks pulmonary clearance/ risk assessment for shoulder surgery. Discussed higher than average risk due to his multiple medical problems, but no  acute concerns. He is at baseline for him. We will let him try Breztri  inhaler samples.  CT chest abdpelvis 02/18/23 IMPRESSION: 1. No acute findings within the chest, abdomen or pelvis. No source of sepsis. 2. Marked increased colonic stool consistent with constipation. No bowel obstruction or inflammation. 3. Multiple stable chronic findings detailed above.   05/02/24- 73 year old male Veteran, never smoker, followed for Bronchiectasis/ recurrent pneumonia/Enterobacter/recurrent aspiration, complicated by DM, HTN, lung nodule, GERD, ischemic colitis/multiple surgeries, valvular heart disease/ TAVR, ASCVD, AFib, Depression,  --Albuterol  neb, albuterol  hfa, Advair250,  Flutter, Cleared at last visit for shoulder surgery, but brought back for same. Discussed the use of AI scribe software for clinical note transcription with the patient, who gave verbal consent to proceed.  History of Present Illness   [name] is a 73 year old male who presents for follow-up regarding clearance for shoulder surgery. He was referred by Dr. Kay for follow-up before shoulder surgery.  He is scheduled for shoulder surgery in early February and presents for updated pulmonary clearance. He was previously cleared, but a repeat preoperative visit was requested.  He no longer coughs up mucus. He has no chest pain, fever, or hemoptysis. He uses albuterol  treatments regularly. Dyspnea on exertion is chronic and stable at his baseline.  He has prior heart valve surgeries via arm and leg access and currently denies chest pain.  He plans to attend an echocardiogram and blood pressure clinic tomorrow as part of his cardiac evaluation before surgery.     Assessment and Plan:   Preop clearance Preoperative pulmonary evaluation for shoulder surgery Preoperative evaluation for upcoming shoulder surgery scheduled for early February. No current respiratory issues. - Ordered chest x-ray for preoperative  evaluation. - Clinically stable from Pulmonary standpoint for necessary surgery. Higher than average risk due to COPD and complex past history. Clear to proceed.  History of prosthetic heart valve - Proceed with scheduled echocardiogram and blood pressure clinic.  History of  respiratory condition managed with bronchodilators Respiratory condition managed with albuterol . No current exacerbations. - Continue current albuterol  treatment regimen.           ROS-   += positive Constitutional:   No-   weight loss, night sweats, +fevers, chills, fatigue, lassitude. HEENT:   No-  headaches, difficulty swallowing, +tooth/dental problems, sore throat,        sneezing, itching, ear ache, nasal congestion, post nasal drip,  CV:      chest pain, +orthopnea, PND, swelling in lower extremities, anasarca, dizziness, +palpitations Resp: + shortness of breath with exertion or at rest.                +productive cough,  No non-productive cough,              change in color of mucus.  No- wheezing.   Skin: No-   rash or lesions. GI:  No-   heartburn, indigestion, +abdominal pain, nausea, vomiting,  GU:  MS:  +  joint pain or swelling.  Neuro-     nothing unusual Psych:  No- change in mood or affect. + depression or anxiety.  No memory loss  OBJ- Physical Exam    General- Alert, Oriented, + calm,  Distress- none acute,  Skin- + fungal nail changes right hand and also reported on toes of both feet Lymphadenopathy- none Head- atraumatic            Eyes- Gross vision intact, PERRLA, conjunctivae and secretions clear            Ears- Hearing, canals-normal            Nose-  no-Septal dev,polyps, erosion, perforation             Throat- Mallampati II , mucosa clear , drainage- none, tonsils- atrophic. + Missing teeth,  Neck- flexible , trachea midline, no stridor , thyroid  nl, carotid no bruit Chest - symmetrical excursion , unlabored           Heart/CV- RRR ,  murmur + 2/6 S aortic , no gallop  , no  rub, nl s1 s2                           - JVD- none , edema+1/ support hose, stasis changes- none, varices- none           Lung-+coarse breath sounds/ few scattered rhonchi, unlabored, cough-none, +talkative on room air                  dullness-none, rub- none           Chest wall-  Abd- + multiple surgical scars Br/ Gen/ Rectal- Not done, not indicated Extrem-  +cane,  Neuro- unremarkable

## 2024-05-02 ENCOUNTER — Encounter: Payer: Self-pay | Admitting: Internal Medicine

## 2024-05-02 ENCOUNTER — Ambulatory Visit

## 2024-05-02 ENCOUNTER — Ambulatory Visit: Admitting: Internal Medicine

## 2024-05-02 VITALS — BP 146/78 | HR 87 | Temp 97.7°F | Ht 74.0 in | Wt 203.8 lb

## 2024-05-02 DIAGNOSIS — J449 Chronic obstructive pulmonary disease, unspecified: Secondary | ICD-10-CM | POA: Diagnosis not present

## 2024-05-02 DIAGNOSIS — J479 Bronchiectasis, uncomplicated: Secondary | ICD-10-CM

## 2024-05-02 NOTE — Patient Instructions (Addendum)
 Glad you feel stable now. I consider you cleared at this point, from a pulmonary standpoint, to go forward with shoulder surgery.  Order- CXR    dx COPD mixed type, bronchiectasis without exacerbation.

## 2024-05-04 ENCOUNTER — Ambulatory Visit (HOSPITAL_COMMUNITY)

## 2024-05-04 ENCOUNTER — Ambulatory Visit: Admitting: Pharmacist

## 2024-05-05 ENCOUNTER — Ambulatory Visit (HOSPITAL_COMMUNITY)

## 2024-05-09 ENCOUNTER — Ambulatory Visit: Payer: Self-pay | Admitting: Internal Medicine

## 2024-05-10 ENCOUNTER — Encounter: Payer: Self-pay | Admitting: Internal Medicine

## 2024-06-06 ENCOUNTER — Telehealth (HOSPITAL_BASED_OUTPATIENT_CLINIC_OR_DEPARTMENT_OTHER): Payer: Self-pay

## 2024-06-06 NOTE — Telephone Encounter (Signed)
"  ° °  Pre-operative Risk Assessment    Patient Name: [name]  DOB: 10-06-1950 MRN: [id number]   Date of last office visit: 05/22/24 with Dr. Anner Date of next office visit: None  Request for Surgical Clearance    Procedure:  left reverse total shoulder arthroplasty   Date of Surgery:  Clearance TBD                                 Surgeon:  Dr. Elspeth Her Surgeon's Group or Practice Name:  [facility] Phone number:  [phone] Fax number:  [phone] or [phone] - [place]   Type of Clearance Requested:   - Medical  - Pharmacy:  Hold Apixaban  (Eliquis ) -needs instructions   Type of Anesthesia:  Choice   Additional requests/questions:  None  SignedPatrcia Iverson CROME   06/06/2024, 1:36 PM    "

## 2024-06-12 ENCOUNTER — Ambulatory Visit (HOSPITAL_COMMUNITY)

## 2024-06-12 NOTE — Telephone Encounter (Signed)
 Patient with diagnosis of afib on Eliquis  for anticoagulation.    Procedure: left reverse total shoulder arthroplasty  Date of procedure: TBD   CHA2DS2-VASc Score = 6   This indicates a 9.7% annual risk of stroke. The patient's score is based upon: CHF History: 1 HTN History: 1 Diabetes History: 1 Stroke History: 2 Vascular Disease History: 0 Age Score: 1 Gender Score: 0      CrCl 77.4 ml/min Platelet count 276  Patient has not had an Afib/aflutter ablation in the last 3 months, DCCV within the last 4 weeks or a watchman implanted in the last 45 days   Per office protocol, patient can hold Eliquis  for 2 days prior to procedure.  If provider plans to use spinal anesthesia and needs a 3 day hold, then cardiologist approval will be needed.   **This guidance is not considered finalized until pre-operative APP has relayed final recommendations.**

## 2024-06-12 NOTE — Telephone Encounter (Signed)
 Spoke to [place] at [facility] and she will have to give me a call back in regards to which anaesthesia will be used in the patient's upcoming surgery.

## 2024-06-12 NOTE — Telephone Encounter (Signed)
 Need clarification of what type of anesthesia.  If spinal, will need to have appt with cardiologist.

## 2024-06-13 ENCOUNTER — Telehealth: Payer: Self-pay

## 2024-06-13 NOTE — Telephone Encounter (Signed)
 Called patient, NA, no VM to leave a message. Will try again later.

## 2024-06-13 NOTE — Telephone Encounter (Signed)
 Called [facility] and left a message with Cherene to give a call back with clarification on if the patient was having spinal anesthesia or something else.

## 2024-06-13 NOTE — Telephone Encounter (Signed)
"  °  Patient Consent for Virtual Visit         [name] has provided verbal consent on 06/13/2024 for a virtual visit (video or telephone).  Appointment is scheduled for 07/03/24  at 2pm. Med req and consent are complete. Call patient at [phone] or [phone].   CONSENT FOR VIRTUAL VISIT FOR:  [name]  By participating in this virtual visit I agree to the following:  I hereby voluntarily request, consent and authorize [facility] and its employed or contracted physicians, physician assistants, nurse practitioners or other licensed health care professionals (the Practitioner), to provide me with telemedicine health care services (the Services) as deemed necessary by the treating Practitioner. I acknowledge and consent to receive the Services by the Practitioner via telemedicine. I understand that the telemedicine visit will involve communicating with the Practitioner through live audiovisual communication technology and the disclosure of certain medical information by electronic transmission. I acknowledge that I have been given the opportunity to request an in-person assessment or other available alternative prior to the telemedicine visit and am voluntarily participating in the telemedicine visit.  I understand that I have the right to withhold or withdraw my consent to the use of telemedicine in the course of my care at any time, without affecting my right to future care or treatment, and that the Practitioner or I may terminate the telemedicine visit at any time. I understand that I have the right to inspect all information obtained and/or recorded in the course of the telemedicine visit and may receive copies of available information for a reasonable fee.  I understand that some of the potential risks of receiving the Services via telemedicine include:  Delay or interruption in medical evaluation due to technological equipment failure or disruption; Information  transmitted may not be sufficient (e.g. poor resolution of images) to allow for appropriate medical decision making by the Practitioner; and/or  In rare instances, security protocols could fail, causing a breach of personal health information.  Furthermore, I acknowledge that it is my responsibility to provide information about my medical history, conditions and care that is complete and accurate to the best of my ability. I acknowledge that Practitioner's advice, recommendations, and/or decision may be based on factors not within their control, such as incomplete or inaccurate data provided by me or distortions of diagnostic images or specimens that may result from electronic transmissions. I understand that the practice of medicine is not an exact science and that Practitioner makes no warranties or guarantees regarding treatment outcomes. I acknowledge that a copy of this consent can be made available to me via my patient portal [facility] MyChart), or I can request a printed copy by calling the office of [facility].    I understand that my insurance will be billed for this visit.   I have read or had this consent read to me. I understand the contents of this consent, which adequately explains the benefits and risks of the Services being provided via telemedicine.  I have been provided ample opportunity to ask questions regarding this consent and the Services and have had my questions answered to my satisfaction. I give my informed consent for the services to be provided through the use of telemedicine in my medical care    "

## 2024-06-13 NOTE — Telephone Encounter (Signed)
 Appointment is scheduled for 07/03/24 at 2pm. Med req and consent are complete. Call patient at [phone] or [phone].

## 2024-06-13 NOTE — Telephone Encounter (Signed)
 Received clarification from [facility] via fax that GENERAL anesthesia will be used for the left reverse total shoulder arthroplasty

## 2024-06-13 NOTE — Telephone Encounter (Signed)
" ° °  Name: [name]  DOB: 02-Jun-1950  MRN: [id number]  Primary Cardiologist: Alm Clay, MD  Preoperative team, please contact this patient and set up a phone call appointment for further preoperative risk assessment. Please obtain consent and complete medication review. Thank you for your help.  I confirm that guidance regarding antiplatelet and oral anticoagulation therapy has been completed and, if necessary, noted below.  Per office protocol, patient can hold Eliquis  for 2 days prior to procedure. -PharmD  I also confirmed the patient resides in the state of [place] . As per [facility] telemedicine laws, the patient must reside in the state in which the provider is licensed.  [name], NP 06/13/2024, 1:15 PM [facility]    "

## 2024-06-15 NOTE — Progress Notes (Signed)
 Patient ID: [name]                 DOB: Oct 23, 1950                      MRN: [id number]      HPI: [name] is a 74 y.o. male referred by Dr. Anner to HTN and lipid clinic. PMH is significant for HTN, h/o mitral valve endocarditis, HLD, T2DM, CHF (LVEF in 2024 55-60%), persistent AFib, COPD, CVA, s/p TAVR.  At last visit with 03/22/24 with Dr. Anner, the patient's blood pressure was elevated at 168/88 and his LDL-C was 137. Carvedilol  was increased to 12.5 mg twice daily.   Last lipid panel performed by [facility] on 06/09/24, showed an TC 111, LDL-C 47, and TG 81. Of note, during the same [facility] visit, the patient's blood pressure was 135/80 mmHg.   At today's visit, the patient reports compliance with all medications. He does check his blood pressure at home; yesterday's reading was 116/72, usually measuring ~120/80s. He denies any lightheadedness or dizziness since increasing his carvedilol  dose. He fills his prescriptions at the [facility] but doesn't report any issues with accessing medications.   Current HTN meds:  Irbesartan  300 mg daily Carvedilol  12.5 mg twice daily   BP goal: <130/80  Current HLD meds: Rosuvastatin  40 mg daily  LDL goal: <55 mg/dL Risk Factors: >34BN, h/o CVA, T2DM, HTN, + family history for ASCVD  Previously tried: amlodipine  5-10 mg daily (swelling), valsartan  320 mg daily, fenofibrate  145 mg daily, hydralazine  25 mg twice daily, losartan  100 mg daily, valsartan -hydrochlorothiazide  320-12.5 mg daily  Family History:  Family History  Problem Relation Age of Onset   Stroke Mother    Heart disease Mother    Heart attack Mother    Heart disease Father    Heart attack Father    Heart attack Sister    Heart attack Brother    Hypertension Brother    Heart attack Son    Stroke Son    Hypertension Brother    Sleep apnea Son    Non-Hodgkin's lymphoma Daughter    Social History:  Tobacco: never  EtOH: none  Diet:  Breakfast: 1 egg, 1  piece of bread Supper: protein (chicken, fish, beef), carb, vegetables, salads Beverages: coffee throughout the day, water   Exercise: walks daily, tries to exercise as able   Home BP readings: usually <120/80, yesterday 116/72  Wt Readings from Last 3 Encounters:  05/02/24 203 lb 12.8 oz (92.4 kg)  04/20/24 211 lb 12.8 oz (96.1 kg)  03/22/24 205 lb 9.6 oz (93.3 kg)   BP Readings from Last 3 Encounters:  06/16/24 122/82  05/02/24 (!) 146/78  04/20/24 136/80   Pulse Readings from Last 3 Encounters:  05/02/24 87  04/20/24 77  03/22/24 66   Renal function: CrCl cannot be calculated (Patient's most recent lab result is older than the maximum 21 days allowed.).  Past Medical History:  Diagnosis Date   Anemia    Asthma    Back pain, chronic, followed at pain clinic 11/25/2011   Carotid artery stenosis 09/06/2014   By Dopplers in September 2017: Right ICA 40-59%.  Less than 40% left ICA.   Colon polyps    COPD (chronic obstructive pulmonary disease) (HCC)    Dementia (HCC)    Mild   Depression    Dyslipidemia (high LDL; low HDL)    statin intolerant; on fibrate   Edema leg from  Venous Stasis    Venous stasis :wears compression stockings; 08/2012 dopplers - no DVT or thrombophlebitis; mild R Popliteal V reflux - no VNUS ablation candidate   GERD (gastroesophageal reflux disease)    Hearing loss    History of CVA (cerebrovascular accident)    History of diabetes mellitus    History of DVT (deep vein thrombosis)    Hypertension    very labile   Nephrolithiasis    Osteoarthritis    Other idiopathic peripheral autonomic neuropathy    Agent orange   PAF (paroxysmal atrial fibrillation) (HCC)    PONV (postoperative nausea and vomiting)    PTSD (post-traumatic stress disorder) 07/24/2015   per patient approach from foot of bed to awake; do not apply any contricting pressure, also avoid approaching from behind with any loud noises    S/P TAVR (transcatheter aortic valve  replacement) 08/08/2019   s/p TAVR w/ a 29 mm Edwards Sapien 3 THV via the TF approach by Drs Wonda and Dusty   Seizure [facility])    Severe aortic stenosis    Skull fracture (HCC)    Small bowel obstruction (HCC) 1990s, 2001, 2015   s/p multiple bowel surgeries; from war wounds    Medications Ordered Prior to Encounter[1]  Allergies[2]  Blood pressure 122/82.   Assessment/Plan:  Essential hypertension Assessment:  BP is well controlled in clinic and at home Reports compliance with all medications  Plan:  Continue irbesartan  300 mg daily and carvedilol  12.5 mg daily  Hyperlipidemia associated with type 2 diabetes mellitus (HCC) Assessment: LDL-C at goal of <55 mg/dL  [facility] the patient's lipid panel showed an LDL of 47 mg/dL, which is at goal.  He reports compliance with Crestor   Plan:  Continue Crestor  40 mg daily  Follow-up: as needed  Thank you, Woodie Jock, PharmD PGY1 Pharmacy Resident  06/16/2024  [name], Pharm.[name], CPP [facility] [address], [place], [place] [address]  Phone: [phone]; Fax: [phone]      [1]  Current Outpatient Medications on File Prior to Visit  Medication Sig Dispense Refill   albuterol  (PROVENTIL ) (2.5 MG/3ML) 0.083% nebulizer solution Take 2.5 mg by nebulization every 6 (six) hours as needed for wheezing or shortness of breath.     albuterol  (VENTOLIN  HFA) 108 (90 Base) MCG/ACT inhaler Inhale 1-2 puffs into the lungs every 4 (four) hours as needed for wheezing or shortness of breath.     apixaban  (ELIQUIS ) 5 MG TABS tablet Take 1 tablet (5 mg total) by mouth 2 (two) times daily. 180 tablet 2   budesonide -glycopyrrolate-formoterol  (BREZTRI  AEROSPHERE) 160-9-4.8 MCG/ACT AERO inhaler Inhale 2 puffs into the lungs in the morning and at bedtime.     carvedilol  (COREG ) 12.5 MG tablet Take 1 tablet (12.5 mg total) by mouth 2 (two) times  daily. 180 tablet 3   Cholecalciferol  (VITAMIN D -3) 125 MCG (5000 UT) TABS Take 5,000 Units by mouth daily.     ferrous sulfate  325 (65 FE) MG EC tablet Take 325 mg by mouth daily with breakfast.     folic acid  (FOLVITE ) 1 MG tablet Take 1 tablet (1 mg total) by mouth daily.     irbesartan  (AVAPRO ) 300 MG tablet Take 1 tablet (300 mg total) by mouth daily. 90 tablet 3   Multiple Vitamin (MULTIVITAMIN WITH MINERALS) TABS tablet Take 1 tablet by mouth at bedtime.     nitroGLYCERIN  (NITROSTAT ) 0.4 MG SL  tablet Place 1 tablet (0.4 mg total) under the tongue every 5 (five) minutes as needed for chest pain. 25 tablet 3   rosuvastatin  (CRESTOR ) 40 MG tablet Take 1 tablet (40 mg total) by mouth daily. 90 tablet 3   No current facility-administered medications on file prior to visit.  [2]  Allergies Allergen Reactions   Bee Venom Anaphylaxis   Bystolic [Nebivolol Hcl] Other (See Comments)    Nightmares, flashbacks (PTSD)   Codeine Shortness Of Breath and Rash   Doxycycline Shortness Of Breath and Rash   Iodinated Contrast Media Shortness Of Breath and Anaphylaxis    Lost consciousness/difficulty breathing Omni - Paque Contrast    Iohexol  Hives, Shortness Of Breath and Rash    Neck and Torso erythemia IVP-DYE >> SHORTNESS OF BREATH    Levaquin  [Levofloxacin ] Hives and Shortness Of Breath   Oxycodone  Hcl Shortness Of Breath, Swelling and Rash   Stadol [Butorphanol] Shortness Of Breath   Succinylcholine Chloride Shortness Of Breath, Nausea Only, Rash and Other (See Comments)    Difficulty breathing    Tamiflu  [Oseltamivir  Phosphate]     UNSPECIFIED REACTION    Atorvastatin     myalgias   Synvisc [Hylan G-F 20] Hives and Rash   Gemfibrozil     Other reaction(s): Serum cholesterol raised   Latex Other (See Comments)    blister   Lisinopril     Other reaction(s): Cough   Omeprazole    Dilaudid  [Hydromorphone  Hcl] Nausea Only and Rash    Aggressive    Methocarbamol  Nausea Only and Rash    Niacin Rash   Pentazocine Lactate Rash    rash

## 2024-06-16 ENCOUNTER — Ambulatory Visit: Admitting: Pharmacist

## 2024-06-16 ENCOUNTER — Encounter: Payer: Self-pay | Admitting: Pharmacist

## 2024-06-16 ENCOUNTER — Ambulatory Visit
Admission: RE | Admit: 2024-06-16 | Discharge: 2024-06-16 | Disposition: A | Discharge status: Home / Self Care | Source: Ambulatory Visit | Attending: Cardiology | Admitting: Cardiology

## 2024-06-16 VITALS — BP 122/82 | HR 82

## 2024-06-16 DIAGNOSIS — I10 Essential (primary) hypertension: Secondary | ICD-10-CM

## 2024-06-16 DIAGNOSIS — I4819 Other persistent atrial fibrillation: Secondary | ICD-10-CM | POA: Insufficient documentation

## 2024-06-16 DIAGNOSIS — Z952 Presence of prosthetic heart valve: Secondary | ICD-10-CM | POA: Insufficient documentation

## 2024-06-16 DIAGNOSIS — E785 Hyperlipidemia, unspecified: Secondary | ICD-10-CM

## 2024-06-16 DIAGNOSIS — E1169 Type 2 diabetes mellitus with other specified complication: Secondary | ICD-10-CM | POA: Diagnosis present

## 2024-06-16 LAB — ECHOCARDIOGRAM COMPLETE
AR max vel: 2.37 cm2
AV Area VTI: 2.16 cm2
AV Area mean vel: 2.16 cm2
AV Mean grad: 12 mmHg
AV Peak grad: 22.6 mmHg
Ao pk vel: 2.38 m/s
Area-P 1/2: 1.92 cm2
MV VTI: 1.6 cm2
S' Lateral: 2.8 cm

## 2024-06-16 NOTE — Assessment & Plan Note (Signed)
 Assessment:  BP is well controlled in clinic and at home Reports compliance with all medications  Plan:  Continue irbesartan  300 mg daily and carvedilol  12.5 mg daily

## 2024-06-16 NOTE — Assessment & Plan Note (Signed)
 Assessment: LDL-C at goal of <55 mg/dL  [facility], the patient's lipid panel showed an LDL of 47 mg/dL, which is at goal.  He reports compliance with Crestor   Plan:  Continue Crestor  40 mg daily

## 2024-06-17 ENCOUNTER — Other Ambulatory Visit: Payer: Self-pay | Admitting: Emergency Medicine

## 2024-06-18 ENCOUNTER — Ambulatory Visit: Payer: Self-pay | Admitting: Cardiology

## 2024-06-24 ENCOUNTER — Other Ambulatory Visit: Payer: Self-pay | Admitting: Emergency Medicine

## 2024-06-30 NOTE — Telephone Encounter (Signed)
 Pt of Dr. Anner. Last Lipid done in 2020. Does Dr. Anner want to refill? Please advise.

## 2024-07-03 ENCOUNTER — Ambulatory Visit

## 2024-07-03 DIAGNOSIS — Z01810 Encounter for preprocedural cardiovascular examination: Secondary | ICD-10-CM

## 2024-07-03 DIAGNOSIS — Z01818 Encounter for other preprocedural examination: Secondary | ICD-10-CM

## 2024-07-03 NOTE — Progress Notes (Signed)
 "   Virtual Visit via Telephone Note   Because of [name] co-morbid illnesses, he is at least at moderate risk for complications without adequate follow up.  This format is felt to be most appropriate for this patient at this time.  Due to technical limitations with video connection (technology), today's appointment will be conducted as an audio only telehealth visit, and [name] verbally agreed to proceed in this manner.   All issues noted in this document were discussed and addressed.  No physical exam could be performed with this format.  Evaluation Performed:  Preoperative cardiovascular risk assessment _____________   Date:  07/03/2024   Patient ID:  [name], DOB 1951/01/25, MRN [id number] Patient Location:  Home Provider location:   Office  Primary Care Provider:  Burney Darice CROME, MD Primary Cardiologist:  Alm Clay, MD  Chief Complaint / Patient Profile   74 y.o. y/o male with a h/o PAF, AS s/p TAVR 07/2019 with a, HFrEF, hypertension, history of CVA, andhyperlipidemia who is pending left reverse total shoulder athroplasty and presents today for telephonic preoperative cardiovascular risk assessment.  History of Present Illness    [name] is a 74 y.o. male who presents via audio/video conferencing for a telehealth visit today.  Pt was last seen in cardiology clinic on 03/22/24 by Dr. Clay.  At that time [name] was doing well with improved swelling.  The patient is now pending procedure as outlined above. Since his last visit, he is currently fighting a sinus infection. He does do housework and vacuums. He functions well with the use of his cane.   He denies chest pain, shortness of breath, lower extremity edema, fatigue, palpitations, melena, hematuria, hemoptysis, diaphoresis, weakness, presyncope, syncope, orthopnea, and PND.   Past Medical History    Past Medical History:  Diagnosis Date   Anemia    Asthma    Back pain,  chronic, followed at pain clinic 11/25/2011   Carotid artery stenosis 09/06/2014   By Dopplers in September 2017: Right ICA 40-59%.  Less than 40% left ICA.   Colon polyps    COPD (chronic obstructive pulmonary disease) (HCC)    Dementia (HCC)    Mild   Depression    Dyslipidemia (high LDL; low HDL)    statin intolerant; on fibrate   Edema leg from Venous Stasis    Venous stasis :wears compression stockings; 08/2012 dopplers - no DVT or thrombophlebitis; mild R Popliteal V reflux - no VNUS ablation candidate   GERD (gastroesophageal reflux disease)    Hearing loss    History of CVA (cerebrovascular accident)    History of diabetes mellitus    History of DVT (deep vein thrombosis)    Hypertension    very labile   Nephrolithiasis    Osteoarthritis    Other idiopathic peripheral autonomic neuropathy    Agent orange   PAF (paroxysmal atrial fibrillation) (HCC)    PONV (postoperative nausea and vomiting)    PTSD (post-traumatic stress disorder) 07/24/2015   per patient approach from foot of bed to awake; do not apply any contricting pressure, also avoid approaching from behind with any loud noises    S/P TAVR (transcatheter aortic valve replacement) 08/08/2019   s/p TAVR w/ a 29 mm Edwards Sapien 3 THV via the TF approach by Drs Wonda and Dusty   Seizure [facility])    Severe aortic stenosis    Skull fracture (HCC)    Small bowel obstruction (HCC) 1990s,  2001, 2015   s/p multiple bowel surgeries; from war wounds   Past Surgical History:  Procedure Laterality Date   APPENDECTOMY  1958   per patient   Carotid Dopplers  01/2016   Stable. RICA - slight progression from<40% to 40-59%.  Otherwise stable bilateral carotids and subclavian arteries. Stable LICA   COLONOSCOPY     DENTAL SURGERY  01/14/2017   5 extractions in preparation for LTKA on 01-25-17; also was started on amoxicillin  x 7days; has since completed therapy    exploratory laparotomy with extensive lysis of adhesions  10/1999    JOINT REPLACEMENT     right knee replaced 2x   LEFT HEART CATHETERIZATION WITH CORONARY ANGIOGRAM N/A 11/26/2011   WNL Dorn JINNY Lesches, MD   NM MYOVIEW  LTD  08/2011   Negative lexiscan myoview : No ischemia or infarction   RIGHT/LEFT HEART CATH AND CORONARY ANGIOGRAPHY N/A 10/22/2016   Procedure: Right/Left Heart Cath and Coronary Angiography;  Surgeon: Anner Alm ORN, MD;  Location: [facility] INVASIVE CV LAB:: Angiographically normal/minimal CAD with tortuous coronary arteries.  Mild pulmonary pretension secondary to severely elevated LVEDP and systemic hypertension.  Mild-moderate aortic valve stenosis with mean gradient 21 mmHg.   RIGHT/LEFT HEART CATH AND CORONARY ANGIOGRAPHY N/A 07/13/2019   Procedure: RIGHT/LEFT HEART CATH AND CORONARY ANGIOGRAPHY;  Surgeon: Anner Alm ORN, MD;  Location: [facility] INVASIVE CV LAB;  Service: Cardiovascular;; : Angiographically normal coronary arteries.  Hemodynamic findings consistent with severe stenosis.  Normal RHC Pressures.   small bowel obstruction  1996, 1999, 2001, 2015   TEE WITHOUT CARDIOVERSION N/A 08/08/2019   Procedure: TRANSESOPHAGEAL ECHOCARDIOGRAM (TEE);  Surgeon: Wonda Sharper, MD;  Location: [facility] INVASIVE CV LAB;  Service: Open Heart Surgery;  Laterality: N/A;   TEE WITHOUT CARDIOVERSION N/A 12/14/2022   Procedure: TRANSESOPHAGEAL ECHOCARDIOGRAM;  Surgeon: Mona Vinie BROCKS, MD;  Location: [facility] INVASIVE CV LAB;  Service: Cardiovascular;  Laterality: N/A;   TONSILLECTOMY  1960   per patient   TOTAL KNEE ARTHROPLASTY     TOTAL KNEE ARTHROPLASTY Left 01/25/2017   Procedure: LEFT TOTAL KNEE ARTHROPLASTY;  Surgeon: Melodi Lerner, MD;  Location: [facility];  Service: Orthopedics;  Laterality: Left;  Adductor Block   TRANSCATHETER AORTIC VALVE REPLACEMENT, TRANSFEMORAL N/A 08/08/2019   Procedure: TRANSCATHETER AORTIC VALVE REPLACEMENT, TRANSFEMORAL;  Surgeon: Wonda Sharper, MD;  Location: [facility] INVASIVE CV LAB;  Service: Open Heart Surgery;  Laterality: N/A;    TRANSTHORACIC ECHOCARDIOGRAM  06/07/2019    Severe calcific aortic stenosis-mean gradient up to 49 mmHg the peak of 79 mmHg.Mild-Mod AI.  Normal LV size and function.  EF 65 to 70%.  Mild concentric LVH with GR 1 DD.  Biatrial size-normal.  Moderate mitral valve calcification with mild MAC and mild MR -> compared to June 2020-increase in both gradients from mean 42 mmHg and peak 66 mmHg   TRANSTHORACIC ECHOCARDIOGRAM  07/2019   a) post TAVR echo 08/09/2019:POD #1 - EF 60-65%, nl fxn TAVR with a mAVg of 14 mm Hg and no PVL.;;  09/07/2019:  o 1 month echo showed EF 65%, normally functioning TAVR with a mean gradient of 13 mm Hg and no PVL.   TRANSTHORACIC ECHOCARDIOGRAM  09/04/2020   1 year post TAVR:  EF 70 to 75%.  Hyperdynamic LV function.  GR 1 DD-mild LA dilation.SABRA  No RWMA.  Normal RV function.  Unable to assess PAP-mildly elevated RAP.  TAVR valve in place.  Mean gradient 11 mmHg no PVL.  Mild ascending aortic dilation.  No change  from previous echo   WRIST FRACTURE SURGERY      Allergies  Allergies[1]  Home Medications    Prior to Admission medications  Medication Sig Start Date End Date Taking? Authorizing Provider  albuterol  (PROVENTIL ) (2.5 MG/3ML) 0.083% nebulizer solution Take 2.5 mg by nebulization every 6 (six) hours as needed for wheezing or shortness of breath.    [provider]  albuterol  (VENTOLIN  HFA) 108 (90 Base) MCG/ACT inhaler Inhale 1-2 puffs into the lungs every 4 (four) hours as needed for wheezing or shortness of breath.    [provider]  apixaban  (ELIQUIS ) 5 MG TABS tablet Take 1 tablet (5 mg total) by mouth 2 (two) times daily. 03/22/24   Anner Alm ORN, MD  budesonide -glycopyrrolate-formoterol  (BREZTRI  AEROSPHERE) 160-9-4.8 MCG/ACT AERO inhaler Inhale 2 puffs into the lungs in the morning and at bedtime. 04/20/24   Neysa Rama D, MD  carvedilol  (COREG ) 12.5 MG tablet Take 1 tablet (12.5 mg total) by mouth 2 (two) times daily. 03/22/24 06/20/24   Anner Alm ORN, MD  Cholecalciferol  (VITAMIN D -3) 125 MCG (5000 UT) TABS Take 5,000 Units by mouth daily.    [provider]  ferrous sulfate  325 (65 FE) MG EC tablet Take 325 mg by mouth daily with breakfast.    [provider]  folic acid  (FOLVITE ) 1 MG tablet Take 1 tablet (1 mg total) by mouth daily. 02/20/23   Evonnie Alm, MD  irbesartan  (AVAPRO ) 300 MG tablet Take 1 tablet (300 mg total) by mouth daily. 03/22/24   Anner Alm ORN, MD  Multiple Vitamin (MULTIVITAMIN WITH MINERALS) TABS tablet Take 1 tablet by mouth at bedtime.    [provider]  nitroGLYCERIN  (NITROSTAT ) 0.4 MG SL tablet PLACE 1 TABLET UNDER THE TONGUE EVERY 5 MINUTES AS NEEDED FOR CHEST PAIN. 06/20/24   Anner Alm ORN, MD  rosuvastatin  (CRESTOR ) 40 MG tablet Take 1 tablet (40 mg total) by mouth daily. 03/22/24   Anner Alm ORN, MD    Physical Exam    Vital Signs:  [name] does not have vital signs available for review today.  Given telephonic nature of communication, physical exam is limited. AAOx3. NAD. Normal affect.  Speech and respirations are unlabored.  Accessory Clinical Findings    None  Assessment & Plan    1.  Preoperative Cardiovascular Risk Assessment: According to the Revised Cardiac Risk Index (RCRI), his Perioperative Risk of Major Cardiac Event is (%): 11  His Functional Capacity in METs is: 4.06 according to the [employer] Activity Status Index (DASI). Per AHA/ACC guidelines, he is deemed acceptable moderate risk for the planned procedure without additional cardiovascular testing. Will route to surgical team so they are aware.   The patient was advised that if he develops new symptoms prior to surgery to contact our office to arrange for a follow-up visit, and he verbalized understanding.  Per office protocol, patient can hold Eliquis  for 2 days prior to procedure.  If provider plans to use spinal anesthesia and needs a 3 day hold, then cardiologist approval will  be needed.   Via Iverson Barge, CMA Received clarification from [facility] via fax that GENERAL anesthesia will be used for the left reverse total shoulder arthroplasty  Per office protocol, patient can hold Eliquis  for 2 days prior to procedure. -PharmD   A copy of this note will be routed to requesting surgeon.  Time:   Today, I have spent 10 minutes with the patient with telehealth technology discussing medical history, symptoms,  and management plan.     Mardy KATHEE Pizza, FNP  07/03/2024, 2:05 PM     [1]  Allergies Allergen Reactions   Bee Venom Anaphylaxis   Bystolic [Nebivolol Hcl] Other (See Comments)    Nightmares, flashbacks (PTSD)   Codeine Shortness Of Breath and Rash   Doxycycline Shortness Of Breath and Rash   Iodinated Contrast Media Shortness Of Breath and Anaphylaxis    Lost consciousness/difficulty breathing Omni - Paque Contrast    Iohexol  Hives, Shortness Of Breath and Rash    Neck and Torso erythemia IVP-DYE >> SHORTNESS OF BREATH    Levaquin  [Levofloxacin ] Hives and Shortness Of Breath   Oxycodone  Hcl Shortness Of Breath, Swelling and Rash   Stadol [Butorphanol] Shortness Of Breath   Succinylcholine Chloride Shortness Of Breath, Nausea Only, Rash and Other (See Comments)    Difficulty breathing    Tamiflu  [Oseltamivir  Phosphate]     UNSPECIFIED REACTION    Atorvastatin     myalgias   Synvisc [Hylan G-F 20] Hives and Rash   Gemfibrozil     Other reaction(s): Serum cholesterol raised   Latex Other (See Comments)    blister   Lisinopril     Other reaction(s): Cough   Omeprazole    Dilaudid  [Hydromorphone  Hcl] Nausea Only and Rash    Aggressive    Methocarbamol  Nausea Only and Rash   Niacin Rash   Pentazocine Lactate Rash    rash   "

## 2024-08-15 ENCOUNTER — Encounter: Admitting: Pulmonary Disease
